# Patient Record
Sex: Female | Born: 1935 | Race: White | Hispanic: No | Marital: Married | Smoking: Former smoker
Health system: Southern US, Community
[De-identification: ages and names within clinical notes are randomized; demographics above are authoritative.]

## PROBLEM LIST (undated history)

## (undated) DIAGNOSIS — K219 Gastro-esophageal reflux disease without esophagitis: Secondary | ICD-10-CM

## (undated) DIAGNOSIS — M810 Age-related osteoporosis without current pathological fracture: Secondary | ICD-10-CM

## (undated) DIAGNOSIS — E0590 Thyrotoxicosis, unspecified without thyrotoxic crisis or storm: Secondary | ICD-10-CM

## (undated) DIAGNOSIS — Z8719 Personal history of other diseases of the digestive system: Secondary | ICD-10-CM

## (undated) DIAGNOSIS — T8859XA Other complications of anesthesia, initial encounter: Secondary | ICD-10-CM

## (undated) DIAGNOSIS — R55 Syncope and collapse: Secondary | ICD-10-CM

## (undated) DIAGNOSIS — I2510 Atherosclerotic heart disease of native coronary artery without angina pectoris: Secondary | ICD-10-CM

## (undated) DIAGNOSIS — I779 Disorder of arteries and arterioles, unspecified: Secondary | ICD-10-CM

## (undated) DIAGNOSIS — I639 Cerebral infarction, unspecified: Secondary | ICD-10-CM

## (undated) DIAGNOSIS — E119 Type 2 diabetes mellitus without complications: Secondary | ICD-10-CM

## (undated) DIAGNOSIS — G8929 Other chronic pain: Secondary | ICD-10-CM

## (undated) DIAGNOSIS — I10 Essential (primary) hypertension: Secondary | ICD-10-CM

## (undated) DIAGNOSIS — H8109 Meniere's disease, unspecified ear: Secondary | ICD-10-CM

## (undated) DIAGNOSIS — N183 Chronic kidney disease, stage 3 unspecified: Secondary | ICD-10-CM

## (undated) DIAGNOSIS — E785 Hyperlipidemia, unspecified: Secondary | ICD-10-CM

## (undated) DIAGNOSIS — E7800 Pure hypercholesterolemia, unspecified: Secondary | ICD-10-CM

## (undated) DIAGNOSIS — I219 Acute myocardial infarction, unspecified: Secondary | ICD-10-CM

## (undated) DIAGNOSIS — M545 Low back pain, unspecified: Secondary | ICD-10-CM

## (undated) DIAGNOSIS — I701 Atherosclerosis of renal artery: Secondary | ICD-10-CM

## (undated) DIAGNOSIS — I739 Peripheral vascular disease, unspecified: Secondary | ICD-10-CM

## (undated) DIAGNOSIS — R001 Bradycardia, unspecified: Secondary | ICD-10-CM

## (undated) DIAGNOSIS — T4145XA Adverse effect of unspecified anesthetic, initial encounter: Secondary | ICD-10-CM

## (undated) DIAGNOSIS — R011 Cardiac murmur, unspecified: Secondary | ICD-10-CM

## (undated) DIAGNOSIS — D649 Anemia, unspecified: Secondary | ICD-10-CM

## (undated) HISTORY — PX: TUBAL LIGATION: SHX77

## (undated) HISTORY — PX: CHOLECYSTECTOMY OPEN: SUR202

## (undated) HISTORY — PX: APPENDECTOMY: SHX54

## (undated) HISTORY — PX: TONSILLECTOMY: SUR1361

## (undated) HISTORY — PX: FRACTURE SURGERY: SHX138

## (undated) HISTORY — PX: BLADDER SUSPENSION: SHX72

## (undated) HISTORY — PX: VAGINAL HYSTERECTOMY: SUR661

## (undated) HISTORY — PX: BACK SURGERY: SHX140

## (undated) HISTORY — PX: CATARACT EXTRACTION W/ INTRAOCULAR LENS  IMPLANT, BILATERAL: SHX1307

---

## 1983-04-15 HISTORY — PX: REDUCTION MAMMAPLASTY: SUR839

## 1997-08-02 ENCOUNTER — Ambulatory Visit (HOSPITAL_COMMUNITY): Admission: RE | Admit: 1997-08-02 | Discharge: 1997-08-02 | Payer: Self-pay | Admitting: *Deleted

## 1998-08-14 ENCOUNTER — Ambulatory Visit (HOSPITAL_COMMUNITY): Admission: RE | Admit: 1998-08-14 | Discharge: 1998-08-14 | Payer: Self-pay | Admitting: *Deleted

## 1998-08-14 ENCOUNTER — Encounter: Payer: Self-pay | Admitting: *Deleted

## 1999-08-23 ENCOUNTER — Ambulatory Visit (HOSPITAL_COMMUNITY): Admission: RE | Admit: 1999-08-23 | Discharge: 1999-08-23 | Payer: Self-pay | Admitting: Obstetrics and Gynecology

## 1999-08-23 ENCOUNTER — Encounter: Payer: Self-pay | Admitting: Obstetrics and Gynecology

## 1999-10-01 ENCOUNTER — Other Ambulatory Visit: Admission: RE | Admit: 1999-10-01 | Discharge: 1999-10-01 | Payer: Self-pay | Admitting: Obstetrics and Gynecology

## 1999-12-23 ENCOUNTER — Other Ambulatory Visit: Admission: RE | Admit: 1999-12-23 | Discharge: 1999-12-23 | Payer: Self-pay | Admitting: Obstetrics and Gynecology

## 1999-12-26 ENCOUNTER — Encounter: Admission: RE | Admit: 1999-12-26 | Discharge: 1999-12-26 | Payer: Self-pay | Admitting: *Deleted

## 1999-12-26 ENCOUNTER — Encounter: Payer: Self-pay | Admitting: *Deleted

## 2000-04-14 DIAGNOSIS — E119 Type 2 diabetes mellitus without complications: Secondary | ICD-10-CM

## 2000-04-14 HISTORY — DX: Type 2 diabetes mellitus without complications: E11.9

## 2000-08-25 ENCOUNTER — Ambulatory Visit (HOSPITAL_COMMUNITY): Admission: RE | Admit: 2000-08-25 | Discharge: 2000-08-25 | Payer: Self-pay | Admitting: Internal Medicine

## 2000-08-25 ENCOUNTER — Encounter: Payer: Self-pay | Admitting: Internal Medicine

## 2000-11-26 ENCOUNTER — Other Ambulatory Visit: Admission: RE | Admit: 2000-11-26 | Discharge: 2000-11-26 | Payer: Self-pay | Admitting: Obstetrics and Gynecology

## 2000-12-16 ENCOUNTER — Ambulatory Visit (HOSPITAL_COMMUNITY): Admission: RE | Admit: 2000-12-16 | Discharge: 2000-12-16 | Payer: Self-pay | Admitting: *Deleted

## 2000-12-16 ENCOUNTER — Encounter: Admission: RE | Admit: 2000-12-16 | Discharge: 2000-12-16 | Payer: Self-pay | Admitting: *Deleted

## 2001-04-29 ENCOUNTER — Encounter: Payer: Self-pay | Admitting: Internal Medicine

## 2001-04-29 ENCOUNTER — Encounter: Admission: RE | Admit: 2001-04-29 | Discharge: 2001-04-29 | Payer: Self-pay | Admitting: Internal Medicine

## 2001-06-29 ENCOUNTER — Emergency Department (HOSPITAL_COMMUNITY): Admission: EM | Admit: 2001-06-29 | Discharge: 2001-06-29 | Payer: Self-pay | Admitting: Emergency Medicine

## 2001-09-09 ENCOUNTER — Ambulatory Visit (HOSPITAL_COMMUNITY): Admission: RE | Admit: 2001-09-09 | Discharge: 2001-09-09 | Payer: Self-pay | Admitting: Internal Medicine

## 2001-09-09 ENCOUNTER — Encounter: Payer: Self-pay | Admitting: Internal Medicine

## 2001-12-03 ENCOUNTER — Other Ambulatory Visit: Admission: RE | Admit: 2001-12-03 | Discharge: 2001-12-03 | Payer: Self-pay | Admitting: Obstetrics and Gynecology

## 2002-02-12 HISTORY — PX: ANTERIOR CERVICAL DECOMP/DISCECTOMY FUSION: SHX1161

## 2002-03-01 ENCOUNTER — Encounter: Payer: Self-pay | Admitting: Neurosurgery

## 2002-03-03 ENCOUNTER — Encounter: Payer: Self-pay | Admitting: Neurosurgery

## 2002-03-03 ENCOUNTER — Inpatient Hospital Stay (HOSPITAL_COMMUNITY): Admission: RE | Admit: 2002-03-03 | Discharge: 2002-03-04 | Payer: Self-pay | Admitting: Neurosurgery

## 2002-07-26 ENCOUNTER — Emergency Department (HOSPITAL_COMMUNITY): Admission: EM | Admit: 2002-07-26 | Discharge: 2002-07-26 | Payer: Self-pay | Admitting: Emergency Medicine

## 2002-07-26 ENCOUNTER — Encounter: Payer: Self-pay | Admitting: Emergency Medicine

## 2002-09-01 ENCOUNTER — Encounter: Payer: Self-pay | Admitting: Neurosurgery

## 2002-09-01 ENCOUNTER — Ambulatory Visit (HOSPITAL_COMMUNITY): Admission: RE | Admit: 2002-09-01 | Discharge: 2002-09-01 | Payer: Self-pay | Admitting: Neurosurgery

## 2002-09-15 ENCOUNTER — Encounter: Payer: Self-pay | Admitting: Obstetrics and Gynecology

## 2002-09-15 ENCOUNTER — Ambulatory Visit (HOSPITAL_COMMUNITY): Admission: RE | Admit: 2002-09-15 | Discharge: 2002-09-15 | Payer: Self-pay | Admitting: Obstetrics and Gynecology

## 2002-11-13 HISTORY — PX: NECK HARDWARE REMOVAL: SUR1129

## 2002-11-21 ENCOUNTER — Encounter: Payer: Self-pay | Admitting: Neurosurgery

## 2002-11-21 ENCOUNTER — Inpatient Hospital Stay (HOSPITAL_COMMUNITY): Admission: RE | Admit: 2002-11-21 | Discharge: 2002-11-23 | Payer: Self-pay | Admitting: Neurosurgery

## 2003-07-18 ENCOUNTER — Observation Stay (HOSPITAL_COMMUNITY): Admission: RE | Admit: 2003-07-18 | Discharge: 2003-07-19 | Payer: Self-pay | Admitting: Urology

## 2003-09-21 ENCOUNTER — Ambulatory Visit (HOSPITAL_COMMUNITY): Admission: RE | Admit: 2003-09-21 | Discharge: 2003-09-21 | Payer: Self-pay | Admitting: Obstetrics and Gynecology

## 2004-08-07 ENCOUNTER — Encounter (INDEPENDENT_AMBULATORY_CARE_PROVIDER_SITE_OTHER): Payer: Self-pay | Admitting: Specialist

## 2004-08-07 ENCOUNTER — Ambulatory Visit (HOSPITAL_COMMUNITY): Admission: RE | Admit: 2004-08-07 | Discharge: 2004-08-07 | Payer: Self-pay | Admitting: Gastroenterology

## 2004-09-30 ENCOUNTER — Ambulatory Visit (HOSPITAL_COMMUNITY): Admission: RE | Admit: 2004-09-30 | Discharge: 2004-09-30 | Payer: Self-pay | Admitting: Internal Medicine

## 2005-04-14 DIAGNOSIS — I219 Acute myocardial infarction, unspecified: Secondary | ICD-10-CM

## 2005-04-14 HISTORY — DX: Acute myocardial infarction, unspecified: I21.9

## 2005-04-14 HISTORY — PX: CARDIAC CATHETERIZATION: SHX172

## 2005-10-02 ENCOUNTER — Ambulatory Visit (HOSPITAL_COMMUNITY): Admission: RE | Admit: 2005-10-02 | Discharge: 2005-10-02 | Payer: Self-pay | Admitting: Obstetrics and Gynecology

## 2005-10-30 ENCOUNTER — Inpatient Hospital Stay (HOSPITAL_COMMUNITY): Admission: EM | Admit: 2005-10-30 | Discharge: 2005-11-10 | Payer: Self-pay | Admitting: Emergency Medicine

## 2005-10-31 ENCOUNTER — Encounter (INDEPENDENT_AMBULATORY_CARE_PROVIDER_SITE_OTHER): Payer: Self-pay | Admitting: Interventional Cardiology

## 2006-05-24 ENCOUNTER — Inpatient Hospital Stay (HOSPITAL_COMMUNITY): Admission: EM | Admit: 2006-05-24 | Discharge: 2006-05-26 | Payer: Self-pay | Admitting: Emergency Medicine

## 2006-05-25 ENCOUNTER — Encounter (INDEPENDENT_AMBULATORY_CARE_PROVIDER_SITE_OTHER): Payer: Self-pay | Admitting: Cardiology

## 2006-07-29 ENCOUNTER — Inpatient Hospital Stay (HOSPITAL_COMMUNITY): Admission: EM | Admit: 2006-07-29 | Discharge: 2006-07-31 | Payer: Self-pay | Admitting: Emergency Medicine

## 2006-09-30 ENCOUNTER — Inpatient Hospital Stay (HOSPITAL_COMMUNITY): Admission: EM | Admit: 2006-09-30 | Discharge: 2006-10-02 | Payer: Self-pay | Admitting: Emergency Medicine

## 2006-10-02 ENCOUNTER — Encounter (INDEPENDENT_AMBULATORY_CARE_PROVIDER_SITE_OTHER): Payer: Self-pay | Admitting: Interventional Cardiology

## 2006-10-02 ENCOUNTER — Ambulatory Visit: Payer: Self-pay | Admitting: Vascular Surgery

## 2006-10-05 ENCOUNTER — Ambulatory Visit (HOSPITAL_COMMUNITY): Admission: RE | Admit: 2006-10-05 | Discharge: 2006-10-05 | Payer: Self-pay | Admitting: Internal Medicine

## 2006-12-14 ENCOUNTER — Ambulatory Visit: Payer: Self-pay | Admitting: Internal Medicine

## 2006-12-14 ENCOUNTER — Inpatient Hospital Stay (HOSPITAL_COMMUNITY): Admission: EM | Admit: 2006-12-14 | Discharge: 2006-12-15 | Payer: Self-pay | Admitting: Emergency Medicine

## 2007-01-01 ENCOUNTER — Emergency Department (HOSPITAL_COMMUNITY): Admission: EM | Admit: 2007-01-01 | Discharge: 2007-01-01 | Payer: Self-pay | Admitting: Emergency Medicine

## 2007-10-20 ENCOUNTER — Ambulatory Visit (HOSPITAL_COMMUNITY): Admission: RE | Admit: 2007-10-20 | Discharge: 2007-10-20 | Payer: Self-pay | Admitting: Internal Medicine

## 2007-10-28 ENCOUNTER — Encounter: Admission: RE | Admit: 2007-10-28 | Discharge: 2007-10-28 | Payer: Self-pay | Admitting: Internal Medicine

## 2007-11-13 HISTORY — PX: BREAST LUMPECTOMY: SHX2

## 2007-11-29 ENCOUNTER — Encounter (INDEPENDENT_AMBULATORY_CARE_PROVIDER_SITE_OTHER): Payer: Self-pay | Admitting: Surgery

## 2007-11-29 ENCOUNTER — Ambulatory Visit (HOSPITAL_BASED_OUTPATIENT_CLINIC_OR_DEPARTMENT_OTHER): Admission: RE | Admit: 2007-11-29 | Discharge: 2007-11-29 | Payer: Self-pay | Admitting: Surgery

## 2007-11-29 ENCOUNTER — Encounter: Admission: RE | Admit: 2007-11-29 | Discharge: 2007-11-29 | Payer: Self-pay | Admitting: Surgery

## 2008-05-10 ENCOUNTER — Inpatient Hospital Stay (HOSPITAL_COMMUNITY): Admission: EM | Admit: 2008-05-10 | Discharge: 2008-05-11 | Payer: Self-pay | Admitting: Emergency Medicine

## 2008-10-20 ENCOUNTER — Ambulatory Visit (HOSPITAL_COMMUNITY): Admission: RE | Admit: 2008-10-20 | Discharge: 2008-10-20 | Payer: Self-pay | Admitting: Internal Medicine

## 2009-02-22 ENCOUNTER — Encounter: Admission: RE | Admit: 2009-02-22 | Discharge: 2009-02-22 | Payer: Self-pay | Admitting: Neurosurgery

## 2009-04-14 HISTORY — PX: HIP FRACTURE SURGERY: SHX118

## 2009-06-30 ENCOUNTER — Emergency Department (HOSPITAL_COMMUNITY): Admission: EM | Admit: 2009-06-30 | Discharge: 2009-06-30 | Payer: Self-pay | Admitting: Emergency Medicine

## 2009-08-12 ENCOUNTER — Emergency Department (HOSPITAL_COMMUNITY): Admission: EM | Admit: 2009-08-12 | Discharge: 2009-08-12 | Payer: Self-pay | Admitting: Emergency Medicine

## 2009-08-13 ENCOUNTER — Emergency Department (HOSPITAL_COMMUNITY): Admission: EM | Admit: 2009-08-13 | Discharge: 2009-08-13 | Payer: Self-pay | Admitting: Emergency Medicine

## 2009-08-15 ENCOUNTER — Ambulatory Visit: Payer: Self-pay | Admitting: Diagnostic Radiology

## 2009-08-15 ENCOUNTER — Encounter: Payer: Self-pay | Admitting: Emergency Medicine

## 2009-08-15 ENCOUNTER — Inpatient Hospital Stay (HOSPITAL_COMMUNITY): Admission: AD | Admit: 2009-08-15 | Discharge: 2009-08-20 | Payer: Self-pay | Admitting: Internal Medicine

## 2009-08-16 ENCOUNTER — Encounter (INDEPENDENT_AMBULATORY_CARE_PROVIDER_SITE_OTHER): Payer: Self-pay | Admitting: Internal Medicine

## 2009-08-16 ENCOUNTER — Ambulatory Visit: Payer: Self-pay | Admitting: Vascular Surgery

## 2009-08-17 ENCOUNTER — Encounter (INDEPENDENT_AMBULATORY_CARE_PROVIDER_SITE_OTHER): Payer: Self-pay | Admitting: Internal Medicine

## 2009-11-21 ENCOUNTER — Ambulatory Visit (HOSPITAL_COMMUNITY): Admission: RE | Admit: 2009-11-21 | Discharge: 2009-11-21 | Payer: Self-pay | Admitting: Obstetrics and Gynecology

## 2010-04-14 HISTORY — PX: RENAL ARTERY STENT: SHX2321

## 2010-04-14 HISTORY — PX: WRIST FRACTURE SURGERY: SHX121

## 2010-04-22 ENCOUNTER — Encounter
Admission: RE | Admit: 2010-04-22 | Discharge: 2010-04-22 | Payer: Self-pay | Source: Home / Self Care | Attending: Internal Medicine | Admitting: Internal Medicine

## 2010-04-28 ENCOUNTER — Emergency Department (HOSPITAL_BASED_OUTPATIENT_CLINIC_OR_DEPARTMENT_OTHER)
Admission: EM | Admit: 2010-04-28 | Discharge: 2010-04-28 | Payer: Self-pay | Source: Home / Self Care | Admitting: Emergency Medicine

## 2010-04-29 ENCOUNTER — Emergency Department (HOSPITAL_BASED_OUTPATIENT_CLINIC_OR_DEPARTMENT_OTHER)
Admission: EM | Admit: 2010-04-29 | Discharge: 2010-04-29 | Payer: Self-pay | Source: Home / Self Care | Admitting: Emergency Medicine

## 2010-04-30 ENCOUNTER — Emergency Department (HOSPITAL_BASED_OUTPATIENT_CLINIC_OR_DEPARTMENT_OTHER)
Admission: EM | Admit: 2010-04-30 | Discharge: 2010-04-30 | Payer: Self-pay | Source: Home / Self Care | Admitting: Emergency Medicine

## 2010-05-02 ENCOUNTER — Ambulatory Visit (HOSPITAL_COMMUNITY)
Admission: RE | Admit: 2010-05-02 | Discharge: 2010-05-02 | Payer: Self-pay | Source: Home / Self Care | Attending: Interventional Cardiology | Admitting: Interventional Cardiology

## 2010-05-05 ENCOUNTER — Encounter: Payer: Self-pay | Admitting: Neurosurgery

## 2010-05-06 LAB — GLUCOSE, CAPILLARY: Glucose-Capillary: 199 mg/dL — ABNORMAL HIGH (ref 70–99)

## 2010-05-07 ENCOUNTER — Ambulatory Visit
Admission: RE | Admit: 2010-05-07 | Discharge: 2010-05-08 | Payer: Self-pay | Source: Home / Self Care | Attending: Orthopedic Surgery | Admitting: Orthopedic Surgery

## 2010-05-08 LAB — POCT I-STAT, CHEM 8
BUN: 13 mg/dL (ref 6–23)
Chloride: 103 mEq/L (ref 96–112)
Glucose, Bld: 160 mg/dL — ABNORMAL HIGH (ref 70–99)
HCT: 37 % (ref 36.0–46.0)
Potassium: 3.6 mEq/L (ref 3.5–5.1)

## 2010-05-08 LAB — GLUCOSE, CAPILLARY: Glucose-Capillary: 143 mg/dL — ABNORMAL HIGH (ref 70–99)

## 2010-05-10 NOTE — Op Note (Signed)
NAME:  [name], [name]                ACCOUNT NO.:  [id number]  MEDICAL RECORD NO.:  [id number]          PATIENT TYPE:  AMB  LOCATION:  SDS                          FACILITY:  [facility]  PHYSICIAN:  Katy Fitch. Sypher, M.D. DATE OF BIRTH:  15-Jan-1932  DATE OF PROCEDURE:  05/07/2010 DATE OF DISCHARGE:  05/02/2010                              OPERATIVE REPORT   PREOPERATIVE DIAGNOSIS:  Comminuted, articular, impacted, displaced dorsal Barton's fracture, left radius.  POSTOPERATIVE DIAGNOSIS:  Comminuted, articular, impacted, displaced dorsal Barton's fracture, left radius.  OPERATION:  Open reduction, internal fixation of left radius with an Acumed dorsal plate system utilizing pegs in the scaphoid and lunate subchondral bone as well as dorsal buttress function.  The fracture was lengthened and the articular surface supported by a cancellous allograft placed prior to plate fixation.  Also, the extensor pollicis longus was re-routed after we noted a 20% abrasion tear/partial rupture of this tendon due to the nature of the articular fracture, with a fracture fragment impinging in the third dorsal compartment.  OPERATING SURGEON:  Katy Fitch. Sypher, M.D.  ASSISTANT:  Annye Rusk, PA-C.  ANESTHESIA:  Left infraclavicular block with sedation.  SUPERVISING ANESTHESIOLOGIST:  Quita Skye. Krista Blue, M.D.  INDICATIONS:  [name] is a 77 year old right-handed retired Designer, jewellery referred through the courtesy of Dr. Kirby Funk in the [facility] for evaluation and management of a comminuted, articular, dorsal Barton's fracture that was significantly impacted and displaced.  Her injury occurred on April 28, 2010.  At the time of her injury, she was noted to have a profoundly elevated systolic blood pressure and was noted to have renal artery stenosis.  Arrangements were made through Dr. Jone Baseman office for her to undergo an angioplasty and stent placement.  This was  accomplished on Thursday, May 02, 2010, in the [facility].  She was unavoidably placed on Plavix following this procedure.  We have had a detailed discussion with Dr. Kirby Funk as well as the attending interventionalist who advised continuing the Plavix during any wrist surgery.  We have discussed this with Mr. and Mrs. Klutts, and they understand that we do have increased risk of postoperative bleeding while on Plavix; however, we will utilize all reasonable clinical means possible to avoid a hematoma formation postoperatively.  The fracture is impacted intra-articular and significantly shortened with the possibility of dorsal escape of the carpus, therefore open reduction internal fixation, dorsal buttress of the scaphoid and lunate facets of the radius, and bone grafting are indicated to optimize left hand function post injury.  Questions regarding the anticipated surgery were invited and answered in detail.  PROCEDURE:  [name] was interviewed by Dr. Krista Blue in the holding area.  A left infraclavicular block was advised for perioperative comfort.  This was accomplished without difficulty under light sedation.  Ms. Bonebrake has multiple, very serious drug allergies, essentially to all narcotics including FENTANYL, MORPHINE, and HYDROMORPHONE.  She is able to tolerate Demerol.  After placement of the block, excellent anesthesia was achieved.  She was subsequently transferred to room one of the [facility] where under Dr. Robina Ade direct  supervision, general sedation was provided.  The left upper extremity was prepped with Betadine soap solution and sterilely draped.  A pneumatic tourniquet was applied to proximal brachium.  On exsanguination of the left arm with Esmarch bandage, the arterial tourniquet was inflated to 220 mmHg.  Ancef 1 g was administered, beginning in the holding area, as an IV prophylactic antibiotic.  A routine surgical time-out  was accomplished followed by an incision extending from the base of the long finger metacarpal 3 cm proximal to Lister's tubercle.  The fourth and third dorsal compartments were identified.  The interval between the fourth and third compartments was incised sharply.  The fourth dorsal compartment tendons were retracted in an ulnar direction and the extensor pollicis longus explored by release of the septum between the third and fourth dorsal compartments.  We immediately encountered a sharp fracture fragment from the comminuted dorsal cortex of the radius and noted a 20% laceration of the extensor pollicis longus as a consequence of this sharp bone edge.  The extensor pollicis longus was re-routed in a subcutaneous direction after release of the fascia proximally and distally, and the areas of damaged tendon were debrided with tenotomy scissors.  A subperiosteal dissection exposed the dorsal aspect of the radius. Lister's tubercle was removed and preserved as a bone graft.  A small dorsal left Acumed plate was selected.  The fracture was reduced with manual manipulation and use of an elevator to anatomically reduce the lunate and scaphoid facets.  This created quite a significant cavity due to comminution.  About 2 mL of cancellous allograft were then morselized and packed into this area, buttressing the articular surface.  The plate was then placed with three nonlocking proximal screws and two distal pegs.  Due to a slightly atypical shape of Mrs. Sinnett' dorsal radius, we had to custom bend the plate to fit the lunate facet.  With bending the plate, we were challenged to have engagement of the distal pins.  We achieved a single pin in the lunate facet and a single pin in the scaphoid facet.  AP and lateral C-arm images documented excellent position of the plate, reduction of the fracture, and good pin length.  After completion of the plate placement, we resected the  posterior interosseous nerve over a distance of 2 cm for postoperative and post- traumatic analgesia.  The posterior interosseous artery was preserved to the carpal arch.  The extensor retinaculum was then repaired over the plate loosely, repairing the fourth dorsal compartment with multiple figure-of-eight sutures of 3-0 Ethibond.  The EPL was carefully re-routed in a subcutaneous manner with further distal fascia excision to prevent friction.  The tourniquet was released and hemostasis was controlled by 10 minutes of direct compression.  We did not encounter significant bleeding despite the Plavix.  The skin was then repaired with subcutaneous 4-0 Vicryl and intradermal 3-0 Prolene loosely.  Three vessel loop drains were placed to allow egress of tense hematoma.  Ms. Mccullers was placed in a voluminous hand-and-arm dressing followed by dorsal and palmar splints, completed to a sugar tong and maintaining the forearm in neutral rotation.  There were no apparent complications.  Due to her health status and recent stent placement, we will observe her for 24 hours postoperatively to monitor blood pressure and also to be certain that she is able to have her pain managed when her block wears off.  She is able to tolerate IV and p.o. Demerol, therefore this will be our primary analgesic.  Katy Fitch Sypher, M.D.     RVS/MEDQ  D:  05/07/2010  T:  05/08/2010  Job:  [id number]  cc:   Thora Lance, M.D.  Electronically Signed by Josephine Igo M.D. on 05/08/2010 07:54:52 AM

## 2010-05-29 NOTE — Procedures (Signed)
NAME:  [name], [name]                ACCOUNT NO.:  [id number]  MEDICAL RECORD NO.:  [id number]          PATIENT TYPE:  AMB  LOCATION:  SDS                          FACILITY:  [facility]  PHYSICIAN:  Corky Crafts, MDDATE OF BIRTH:  [birth year]  DATE OF PROCEDURE:  05/02/2010 DATE OF DISCHARGE:  05/02/2010                   PERIPHERAL VASCULAR INVASIVE PROCEDURE   PERIPHERAL VASCULAR REPORT  PRIMARY CARE PROVIDER:  Thora Lance, MD  PROCEDURES PERFORMED: 1. Abdominal aortogram. 2. Bilateral selective renal angiogram. 3. Left renal artery percutaneous transluminal angioplasty/stent.  OPERATOR:  Corky Crafts, MD  INDICATION:  Refractory hypertension.  PROCEDURE NOTE:  After the risks and benefits of angiography were explained to the patient, informed consent was obtained.  She was brought to the [facility].  She was prepped and draped in the usual sterile fashion.  Her right groin was infiltrated with 1% lidocaine.  A 6-French sheath was placed into the right common femoral artery using modified Seldinger technique.  A pigtail catheter was advanced to the abdominal aorta, and a power injection of contrast was performed in the AP projection.  A short JR-4 catheter was then used to selectively engage the left renal artery.  The same catheter was then used to selectively engage the right renal artery.  Digital angiography was performed in several projections using contrast.  The PTA and stent were then performed.  Please see below for details.  The anterior image with femoral angiogram and Angio-Seal was deployed for hemostasis.  FINDINGS:  Abdominal aorta shows moderate atherosclerosis but no evidence of an aneurysm.  The aortoiliac bifurcation is widely patent. The left kidney:  There is dual arterial supply.  There is a small accessory renal artery which is widely patent which is inferior to the main branch.  The superior main branch has a 70% ostial stenosis with  a 20-mm gradient across the vessel.  In the midportion of this vessel, there appears to be some fibromuscular dysplasia as well. The right kidney is supplied by a single right renal artery which appears widely patent.  PTA/stent narrative:  A 6-French IMA guide catheter was used to engage the main left renal artery.  A Spartacore wire was placed across the area of interest, 5.0 x 20 mm ViaTrac balloon was advanced to the mid vessel where the fibromuscular dysplasia was and inflated to 8 atmospheres for two inflations.  The balloon was then withdrawn to the ostium and inflated again to 8 atmospheres.  A 6.0 x 12 mm Herculink stent was then advanced to the ostium of the main left renal artery and deployed at 11 atmospheres.  There was no additional stenosis and additional inflation was performed to flare the ostium of the stent. There was normal flow through the vessel.  IMPRESSION: 1. Significant left renal artery stenosis, successful percutaneous     transluminal angioplasty to the mid left renal artery treating mild     fibromuscular dysplasia. 2. Successful percutaneous transluminal angioplasty/sent to the ostial     left renal artery with a 6.0 x 12 Herculink stent.  There is no     residual stenosis.  RECOMMENDATIONS:  Continue aspirin for the  next few days.  She will be having arm surgery, so I believe this will have to be stopped.  She will continue Plavix uninterrupted for 30 days.  Of note, IV metoprolol was given during the case to treat her elevated blood pressure and this worked well.  Her initial blood pressure at start of the case was 204 systolic and it came down to about 160 after two rounds of 5 mg IV metoprolol.  The patient will be sent home assuming no problems with her groin.     Corky Crafts, MD     JSV/MEDQ  D:  05/02/2010  T:  05/02/2010  Job:  [id number]  cc:   Thora Lance, M.D. Katy Fitch Sypher, M.D. Lyn Records, M.D.  Electronically  Signed by Lance Muss MD on 05/29/2010 09:35:38 AM

## 2010-06-25 ENCOUNTER — Emergency Department (HOSPITAL_COMMUNITY)
Admission: EM | Admit: 2010-06-25 | Discharge: 2010-06-26 | Disposition: A | Discharge status: Home / Self Care | Payer: Medicare Other | Attending: Emergency Medicine | Admitting: Emergency Medicine

## 2010-06-25 DIAGNOSIS — I10 Essential (primary) hypertension: Secondary | ICD-10-CM | POA: Insufficient documentation

## 2010-06-25 DIAGNOSIS — N390 Urinary tract infection, site not specified: Secondary | ICD-10-CM | POA: Insufficient documentation

## 2010-06-25 DIAGNOSIS — E119 Type 2 diabetes mellitus without complications: Secondary | ICD-10-CM | POA: Insufficient documentation

## 2010-06-25 DIAGNOSIS — R300 Dysuria: Secondary | ICD-10-CM | POA: Insufficient documentation

## 2010-06-25 DIAGNOSIS — M810 Age-related osteoporosis without current pathological fracture: Secondary | ICD-10-CM | POA: Insufficient documentation

## 2010-06-25 DIAGNOSIS — I252 Old myocardial infarction: Secondary | ICD-10-CM | POA: Insufficient documentation

## 2010-06-25 DIAGNOSIS — R509 Fever, unspecified: Secondary | ICD-10-CM | POA: Insufficient documentation

## 2010-06-26 LAB — DIFFERENTIAL
Eosinophils Absolute: 0.1 10*3/uL (ref 0.0–0.7)
Eosinophils Relative: 1 % (ref 0–5)
Lymphs Abs: 0.8 10*3/uL (ref 0.7–4.0)
Monocytes Absolute: 0.4 10*3/uL (ref 0.1–1.0)
Monocytes Relative: 4 % (ref 3–12)

## 2010-06-26 LAB — COMPREHENSIVE METABOLIC PANEL
Albumin: 3.3 g/dL — ABNORMAL LOW (ref 3.5–5.2)
BUN: 26 mg/dL — ABNORMAL HIGH (ref 6–23)
Calcium: 8.9 mg/dL (ref 8.4–10.5)
Chloride: 102 mEq/L (ref 96–112)
Creatinine, Ser: 1.21 mg/dL — ABNORMAL HIGH (ref 0.4–1.2)
Total Bilirubin: 0.5 mg/dL (ref 0.3–1.2)

## 2010-06-26 LAB — CBC
MCH: 29.9 pg (ref 26.0–34.0)
MCHC: 35.2 g/dL (ref 30.0–36.0)
MCV: 84.8 fL (ref 78.0–100.0)
Platelets: 155 10*3/uL (ref 150–400)
RDW: 13.6 % (ref 11.5–15.5)

## 2010-06-26 LAB — URINALYSIS, ROUTINE W REFLEX MICROSCOPIC
Protein, ur: 30 mg/dL — AB
Specific Gravity, Urine: 1.013 (ref 1.005–1.030)
Urobilinogen, UA: 0.2 mg/dL (ref 0.0–1.0)

## 2010-06-26 LAB — URINE MICROSCOPIC-ADD ON

## 2010-06-27 LAB — URINE CULTURE: Culture  Setup Time: 201203140943

## 2010-07-02 LAB — URINALYSIS, ROUTINE W REFLEX MICROSCOPIC
Bilirubin Urine: NEGATIVE
Bilirubin Urine: NEGATIVE
Glucose, UA: NEGATIVE mg/dL
Glucose, UA: NEGATIVE mg/dL
Hgb urine dipstick: NEGATIVE
Ketones, ur: NEGATIVE mg/dL
Ketones, ur: NEGATIVE mg/dL
Nitrite: NEGATIVE
Nitrite: NEGATIVE
Protein, ur: NEGATIVE mg/dL
Specific Gravity, Urine: 1.011 (ref 1.005–1.030)
Urobilinogen, UA: 0.2 mg/dL (ref 0.0–1.0)
pH: 6.5 (ref 5.0–8.0)
pH: 5.5 (ref 5.0–8.0)

## 2010-07-02 LAB — BASIC METABOLIC PANEL
BUN: 15 mg/dL (ref 6–23)
CO2: 24 mEq/L (ref 19–32)
CO2: 27 mEq/L (ref 19–32)
CO2: 27 mEq/L (ref 19–32)
Calcium: 7.6 mg/dL — ABNORMAL LOW (ref 8.4–10.5)
Calcium: 8 mg/dL — ABNORMAL LOW (ref 8.4–10.5)
Chloride: 106 mEq/L (ref 96–112)
Chloride: 106 mEq/L (ref 96–112)
GFR calc non Af Amer: 32 mL/min — ABNORMAL LOW (ref >60)
GFR calc non Af Amer: 56 mL/min — ABNORMAL LOW (ref >60)
Glucose, Bld: 130 mg/dL — ABNORMAL HIGH (ref 70–99)
Glucose, Bld: 146 mg/dL — ABNORMAL HIGH (ref 70–99)
Glucose, Bld: 154 mg/dL — ABNORMAL HIGH (ref 70–99)
Potassium: 3.8 mEq/L (ref 3.5–5.1)
Potassium: 3.9 mEq/L (ref 3.5–5.1)
Potassium: 4.7 mEq/L (ref 3.5–5.1)
Sodium: 134 mEq/L — ABNORMAL LOW (ref 135–145)
Sodium: 135 mEq/L (ref 135–145)
Sodium: 137 mEq/L (ref 135–145)
Sodium: 139 mEq/L (ref 135–145)

## 2010-07-02 LAB — COMPREHENSIVE METABOLIC PANEL
ALT: 26 U/L (ref 0–35)
AST: 37 U/L (ref 0–37)
Alkaline Phosphatase: 66 U/L (ref 39–117)
BUN: 21 mg/dL (ref 6–23)
CO2: 25 mEq/L (ref 19–32)
Calcium: 9.6 mg/dL (ref 8.4–10.5)
Calcium: 9.1 mg/dL (ref 8.4–10.5)
Creatinine, Ser: 1.05 mg/dL (ref 0.4–1.2)
GFR calc Af Amer: 41 mL/min — ABNORMAL LOW (ref >60)
GFR calc non Af Amer: 34 mL/min — ABNORMAL LOW (ref >60)
Glucose, Bld: 110 mg/dL — ABNORMAL HIGH (ref 70–99)
Glucose, Bld: 133 mg/dL — ABNORMAL HIGH (ref 70–99)
Potassium: 4.6 mEq/L (ref 3.5–5.1)
Sodium: 135 mEq/L (ref 135–145)
Total Protein: 7.8 g/dL (ref 6.0–8.3)
Total Protein: 7.3 g/dL (ref 6.0–8.3)

## 2010-07-02 LAB — PROTIME-INR
INR: 0.92 (ref 0.00–1.49)
INR: 0.97 (ref 0.00–1.49)
Prothrombin Time: 12.3 seconds (ref 11.6–15.2)
Prothrombin Time: 13.7 seconds (ref 11.6–15.2)
Prothrombin Time: 19.9 seconds — ABNORMAL HIGH (ref 11.6–15.2)
Prothrombin Time: 12.8 s (ref 11.6–15.2)

## 2010-07-02 LAB — CBC
HCT: 41 % (ref 36.0–46.0)
HCT: 23 % — ABNORMAL LOW (ref 36.0–46.0)
HCT: 26.4 % — ABNORMAL LOW (ref 36.0–46.0)
HCT: 30.8 % — ABNORMAL LOW (ref 36.0–46.0)
HCT: 38.1 % (ref 36.0–46.0)
Hemoglobin: 14.1 g/dL (ref 12.0–15.0)
Hemoglobin: 10.3 g/dL — ABNORMAL LOW (ref 12.0–15.0)
Hemoglobin: 11 g/dL — ABNORMAL LOW (ref 12.0–15.0)
Hemoglobin: 7.8 g/dL — ABNORMAL LOW (ref 12.0–15.0)
Hemoglobin: 9 g/dL — ABNORMAL LOW (ref 12.0–15.0)
Hemoglobin: 13.2 g/dL (ref 12.0–15.0)
MCHC: 34.5 g/dL (ref 30.0–36.0)
MCHC: 33.5 g/dL (ref 30.0–36.0)
MCHC: 34.2 g/dL (ref 30.0–36.0)
MCHC: 34.7 g/dL (ref 30.0–36.0)
MCV: 90.3 fL (ref 78.0–100.0)
MCV: 86 fL (ref 78.0–100.0)
Platelets: 137 10*3/uL — ABNORMAL LOW (ref 150–400)
Platelets: 218 K/uL (ref 150–400)
RBC: 3.41 MIL/uL — ABNORMAL LOW (ref 3.87–5.11)
RBC: 4.43 MIL/uL (ref 3.87–5.11)
RDW: 13.2 % (ref 11.5–15.5)
RDW: 13.2 % (ref 11.5–15.5)
RDW: 13.6 % (ref 11.5–15.5)
RDW: 14.1 % (ref 11.5–15.5)
RDW: 12.6 % (ref 11.5–15.5)
WBC: 7.4 10*3/uL (ref 4.0–10.5)
WBC: 8.8 K/uL (ref 4.0–10.5)

## 2010-07-02 LAB — GLUCOSE, CAPILLARY
Glucose-Capillary: 102 mg/dL — ABNORMAL HIGH (ref 70–99)
Glucose-Capillary: 120 mg/dL — ABNORMAL HIGH (ref 70–99)
Glucose-Capillary: 128 mg/dL — ABNORMAL HIGH (ref 70–99)
Glucose-Capillary: 133 mg/dL — ABNORMAL HIGH (ref 70–99)
Glucose-Capillary: 134 mg/dL — ABNORMAL HIGH (ref 70–99)
Glucose-Capillary: 144 mg/dL — ABNORMAL HIGH (ref 70–99)
Glucose-Capillary: 151 mg/dL — ABNORMAL HIGH (ref 70–99)
Glucose-Capillary: 153 mg/dL — ABNORMAL HIGH (ref 70–99)
Glucose-Capillary: 166 mg/dL — ABNORMAL HIGH (ref 70–99)

## 2010-07-02 LAB — URINE MICROSCOPIC-ADD ON

## 2010-07-02 LAB — DIFFERENTIAL
Basophils Absolute: 0 10*3/uL (ref 0.0–0.1)
Basophils Absolute: 0 K/uL (ref 0.0–0.1)
Basophils Relative: 1 % (ref 0–1)
Basophils Relative: 0 % (ref 0–1)
Eosinophils Absolute: 0.2 10*3/uL (ref 0.0–0.7)
Eosinophils Absolute: 0.2 10*3/uL (ref 0.0–0.7)
Eosinophils Relative: 2 % (ref 0–5)
Eosinophils Relative: 2 % (ref 0–5)
Lymphocytes Relative: 23 % (ref 12–46)
Lymphocytes Relative: 16 % (ref 12–46)
Lymphs Abs: 1.6 10*3/uL (ref 0.7–4.0)
Lymphs Abs: 1.4 10*3/uL (ref 0.7–4.0)
Monocytes Absolute: 0.7 10*3/uL (ref 0.1–1.0)
Monocytes Absolute: 0.5 K/uL (ref 0.1–1.0)
Monocytes Relative: 8 % (ref 3–12)
Monocytes Relative: 6 % (ref 3–12)
Neutro Abs: 4.7 10*3/uL (ref 1.7–7.7)
Neutro Abs: 6.2 10*3/uL (ref 1.7–7.7)
Neutro Abs: 6.7 K/uL (ref 1.7–7.7)
Neutrophils Relative %: 68 % (ref 43–77)
Neutrophils Relative %: 76 % (ref 43–77)

## 2010-07-02 LAB — CARDIAC PANEL(CRET KIN+CKTOT+MB+TROPI)
CK, MB: 1.9 ng/mL (ref 0.3–4.0)
CK, MB: 2.1 ng/mL (ref 0.3–4.0)
Relative Index: 1.3 (ref 0.0–2.5)
Relative Index: 1.9 (ref 0.0–2.5)
Total CK: 119 U/L (ref 7–177)
Total CK: 144 U/L (ref 7–177)
Troponin I: 0.02 ng/mL (ref 0.00–0.06)

## 2010-07-02 LAB — POCT CARDIAC MARKERS
CKMB, poc: <1 ng/mL — ABNORMAL LOW (ref 1.0–8.0)
Myoglobin, poc: 113 ng/mL (ref 12–200)
Troponin i, poc: <0.05 ng/mL (ref 0.00–0.09)

## 2010-07-02 LAB — URINE CULTURE: Colony Count: >=100000

## 2010-07-02 LAB — APTT
aPTT: 29 seconds (ref 24–37)
aPTT: 28 s (ref 24–37)

## 2010-07-02 LAB — CROSSMATCH: Antibody Screen: NEGATIVE

## 2010-07-02 LAB — COMPREHENSIVE METABOLIC PANEL WITH GFR
Albumin: 4 g/dL (ref 3.5–5.2)
BUN: 28 mg/dL — ABNORMAL HIGH (ref 6–23)
Chloride: 100 meq/L (ref 96–112)
Creatinine, Ser: 1.5 mg/dL — ABNORMAL HIGH (ref 0.4–1.2)
Total Bilirubin: 0.6 mg/dL (ref 0.3–1.2)

## 2010-07-02 LAB — ABO/RH: ABO/RH(D): A POS

## 2010-07-07 LAB — DIFFERENTIAL
Eosinophils Relative: 2 % (ref 0–5)
Lymphocytes Relative: 22 % (ref 12–46)
Lymphs Abs: 1.5 10*3/uL (ref 0.7–4.0)
Monocytes Relative: 8 % (ref 3–12)

## 2010-07-07 LAB — BASIC METABOLIC PANEL
BUN: 25 mg/dL — ABNORMAL HIGH (ref 6–23)
GFR calc Af Amer: 38 mL/min — ABNORMAL LOW (ref >60)
GFR calc non Af Amer: 31 mL/min — ABNORMAL LOW (ref >60)
Potassium: 4.9 mEq/L (ref 3.5–5.1)
Sodium: 140 mEq/L (ref 135–145)

## 2010-07-07 LAB — CBC
HCT: 39 % (ref 36.0–46.0)
Platelets: 233 10*3/uL (ref 150–400)
RBC: 4.43 MIL/uL (ref 3.87–5.11)
WBC: 7.1 10*3/uL (ref 4.0–10.5)

## 2010-07-07 LAB — URINALYSIS, ROUTINE W REFLEX MICROSCOPIC
Bilirubin Urine: NEGATIVE
Nitrite: NEGATIVE
Specific Gravity, Urine: 1.006 (ref 1.005–1.030)
pH: 7.5 (ref 5.0–8.0)

## 2010-07-07 LAB — HEMOCCULT GUIAC POC 1CARD (OFFICE): Fecal Occult Bld: POSITIVE

## 2010-07-29 LAB — CBC
HCT: 34.8 % — ABNORMAL LOW (ref 36.0–46.0)
HCT: 35.8 % — ABNORMAL LOW (ref 36.0–46.0)
HCT: 37.4 % (ref 36.0–46.0)
MCHC: 33.6 g/dL (ref 30.0–36.0)
MCHC: 33.9 g/dL (ref 30.0–36.0)
MCV: 87.9 fL (ref 78.0–100.0)
MCV: 88.5 fL (ref 78.0–100.0)
Platelets: 200 10*3/uL (ref 150–400)
Platelets: 205 10*3/uL (ref 150–400)
Platelets: 214 10*3/uL (ref 150–400)
RDW: 12.9 % (ref 11.5–15.5)
RDW: 13 % (ref 11.5–15.5)
RDW: 13.4 % (ref 11.5–15.5)
WBC: 6.6 10*3/uL (ref 4.0–10.5)
WBC: 7.4 10*3/uL (ref 4.0–10.5)

## 2010-07-29 LAB — APTT: aPTT: >200 seconds (ref 24–37)

## 2010-07-29 LAB — CARDIAC PANEL(CRET KIN+CKTOT+MB+TROPI)
CK, MB: 1.5 ng/mL (ref 0.3–4.0)
Relative Index: INVALID (ref 0.0–2.5)
Relative Index: INVALID (ref 0.0–2.5)
Total CK: 48 U/L (ref 7–177)
Total CK: 56 U/L (ref 7–177)
Troponin I: 0.01 ng/mL (ref 0.00–0.06)

## 2010-07-29 LAB — BASIC METABOLIC PANEL
BUN: 28 mg/dL — ABNORMAL HIGH (ref 6–23)
BUN: 38 mg/dL — ABNORMAL HIGH (ref 6–23)
BUN: 39 mg/dL — ABNORMAL HIGH (ref 6–23)
CO2: 23 mEq/L (ref 19–32)
CO2: 23 mEq/L (ref 19–32)
Chloride: 103 mEq/L (ref 96–112)
Chloride: 104 mEq/L (ref 96–112)
Chloride: 109 mEq/L (ref 96–112)
Creatinine, Ser: 1.15 mg/dL (ref 0.4–1.2)
Creatinine, Ser: 1.58 mg/dL — ABNORMAL HIGH (ref 0.4–1.2)
GFR calc Af Amer: 40 mL/min — ABNORMAL LOW (ref >60)
GFR calc non Af Amer: 33 mL/min — ABNORMAL LOW (ref >60)
Glucose, Bld: 154 mg/dL — ABNORMAL HIGH (ref 70–99)
Glucose, Bld: 188 mg/dL — ABNORMAL HIGH (ref 70–99)
Potassium: 3.9 mEq/L (ref 3.5–5.1)
Potassium: 4.2 mEq/L (ref 3.5–5.1)
Potassium: 4.3 mEq/L (ref 3.5–5.1)
Sodium: 137 mEq/L (ref 135–145)

## 2010-07-29 LAB — TSH: TSH: 0.525 u[IU]/mL (ref 0.350–4.500)

## 2010-07-29 LAB — GLUCOSE, CAPILLARY
Glucose-Capillary: 143 mg/dL — ABNORMAL HIGH (ref 70–99)
Glucose-Capillary: 234 mg/dL — ABNORMAL HIGH (ref 70–99)

## 2010-07-29 LAB — PROTIME-INR: Prothrombin Time: 13.9 seconds (ref 11.6–15.2)

## 2010-07-29 LAB — POCT CARDIAC MARKERS
CKMB, poc: <1 ng/mL — ABNORMAL LOW (ref 1.0–8.0)
Troponin i, poc: <0.05 ng/mL (ref 0.00–0.09)

## 2010-07-29 LAB — HEMOGLOBIN A1C: Hgb A1c MFr Bld: 6.2 % — ABNORMAL HIGH (ref 4.6–6.1)

## 2010-07-29 LAB — HEPARIN LEVEL (UNFRACTIONATED): Heparin Unfractionated: 0.36 IU/mL (ref 0.30–0.70)

## 2010-07-29 LAB — LIPID PANEL
Cholesterol: 177 mg/dL (ref 0–200)
HDL: 31 mg/dL — ABNORMAL LOW (ref >39)
Total CHOL/HDL Ratio: 5.7 RATIO

## 2010-08-27 NOTE — Discharge Summary (Signed)
NAME:  [name], [name]                ACCOUNT NO.:  [id number]   MEDICAL RECORD NO.:  [id number]          PATIENT TYPE:  INP   LOCATION:  4706                         FACILITY:  [facility]   PHYSICIAN:  Guy Franco, P.A.       DATE OF BIRTH:  [birth year]   DATE OF ADMISSION:  09/30/2006  DATE OF DISCHARGE:  10/02/2006                               DISCHARGE SUMMARY   DISCHARGE DIAGNOSES:  1. Hypertension, medical management.  2. A 60% renal artery stenosis, left.  3. Known coronary artery disease.  4. Hyperlipidemia.  5. Diabetes mellitus, diet controlled.  6. Seasonal allergies.  7. Multiple medication intolerances, including CODEINE, FENTANYL,      INAPSINE, something that she says was used in the operating room,      MORPHINE, NORVASC causes leg swelling, AVAPRO, LISINOPRIL causes      cough and diarrhea, BETA BLOCKERS IN THE PAST, although she can      take Coreg, SHELL FISH, IV DYE, FOSAMAX, ACTONEL, ZYRTEC, DRICORT,      LIPITOR, SEAFOOD.   HOSPITAL COURSE:  [name] is a 77 year old female with a known history  of coronary artery disease.  She suffered a myocardial infarction in  June 2007 involving the right coronary artery that was unable to be  opened.  Repeat catheterization in April 2008 confirmed that the total  occlusion of the mid-right coronary artery with left to right  collaterals.  She also had a mid 50-60% LAD lesion, circumflex was  patent.  She was also noted to have a left ostial 60% renal artery  stenosis.  EF at that time was 60%.   She was awakened on the morning of admission with left sided substernal  chest pain that lasted about 45 minutes.  She took 2 sublingual  nitroglycerin with good relief.  She took her blood pressure, and it was  213/110.  Blood pressure remained elevated during the morning, and she  called the office and then proceeded to the emergency room.  In the  emergency room, blood pressure was 186/90.   LABORATORY STUDIES DURING THE  PATIENT'S HOSPITAL STAY:  Sodium 139,  potassium 4.4, BUN 22, creatinine 0.97.  D-dimer 0.41.  Hemoglobin 12.9,  hematocrit 37.8, white count 7.0, platelets 254.  Cardiac isoenzymes  were negative.  Triglycerides 453, HDL 29, LDL unable to calculate  secondary to hypertriglyceridemia, total cholesterol 163, TSH 0.921.   The patient was placed in the hospital, and clonidine was added to her  regimen.  She said that she could not use this medication secondary to  the fact that it kept her awake all night long.  She did also complain  of some blurred vision, and felt this was secondary to the clonidine.  She was now refusing clonidine.  We did perform carotid Dopplers, and  preliminary results showed right ICA stenosis 40-60%, left ICA stenosis  40-60%.   Because the carotid Dopplers are complete, and the fact that she has had  Cardizem in the past, we are going to discharge her home on Cardizem-CD  120 mg a day and  have her follow up in the office.   DISCHARGE MEDICATIONS:  She is to start Cardizem-CD 120 mg 1 tablet  daily.   She is to resume her other medications:  1. Coreg 25 mg p.o. b.i.d.  2. Allegra 180 mg a day.  3. Maxzide daily.  4. K-Dur 20 mEq daily.  5. Zocor 40 mg a day.  6. Nexium as prior to admission.  7. Coated aspirin 325 mg a day.  8. Antivert as needed for dizziness.  9. She may resume the following vitamins:  Vitamin E, cod oil,      Ocuvite, folic acid, and vitamin B6 daily.   DISCHARGE INSTRUCTIONS:  Remain on a low-sodium, heart healthy diabetic  diet, increase activity slowly, and follow up with Dr. Verdis Prime on  November 05, 2006, at 2:30 p.m.   Please note that the patient had a 24-hour urine performed to rule out  pheochromocytoma.  The results are not yet back.  She just finished the  test some time late yesterday.  We need to check on the results when she  comes back into the office.      Guy Franco, P.A.     LB/MEDQ  D:  10/02/2006  T:   10/03/2006  Job:  [id number]

## 2010-08-27 NOTE — Discharge Summary (Signed)
NAME:  [name], [name] NO.:  [id number]   MEDICAL RECORD NO.:  [id number]          PATIENT TYPE:  INP   LOCATION:  4729                         FACILITY:  [facility]   PHYSICIAN:  Jake Bathe, MD      DATE OF BIRTH:  1931-12-31   DATE OF ADMISSION:  05/10/2008  DATE OF DISCHARGE:  05/11/2008                               DISCHARGE SUMMARY   PRIMARY CARE PHYSICIAN:  Thora Lance, MD   CARDIOLOGIST:  Lyn Records, MD   FINAL DIAGNOSES:  1. Chest pain/angina - low risk acute coronary syndrome - all cardiac      biomarkers were normal , EKGs were unremarkable without any change,      the patient chest pain free shortly after arrival.  2. Coronary artery disease - acute right ventricular infarct in 2007.      Cardiac catheterization in April 2008 showing an occluded right      coronary artery, left anterior descending 50-60% stenosis - medical      management.  3. Renal artery stenosis - left 60%.  4. Chronic kidney disease - improved with hydration during this      hospitalization and discontinuation of Maxzide.  Creatinine was 1.6      down to 1.1.  5. Diabetes mellitus - diet controlled per patient, hemoglobin A1c      6.2.  6. Mixed hyperlipidemia - triglycerides 546, HDL 31, total cholesterol      177, and LDL uncalculable - the patient has been on Lopid in the      past, has tried TriCor in the past also but has had some      intolerance issues.  She has not been on fish oil.  Her diabetes as      above has been under good control.  She has been battling this      issue for quite some time.  7. Hypertension - throughout hospitalization blood pressures 100/55 to      136/64.  8. SHELLFISH allergy.   BRIEF HOSPITAL COURSE:  The patient was admitted very early in the  morning on May 10, 2008, after she was awoken by substernal chest  discomfort that was categorized as severe and similar to her previous  angina but not quite as severe as her previous  heart attack.  It did not  radiate and it was not associated with any nausea, vomiting, or excess  diaphoresis.  It was a pressure-type discomfort and prompted her  emergency room evaluation.  Once here, she was given IV heparin and  nitroglycerin which she states that once the heparin was initiated her  chest discomfort was relieved.  She walks daily and has not had any  problems.  She describes no fevers or coughing.  Currently, she is  feeling well, relaxing in bed, has been ambulatory without any  difficulty, and describes no further chest pain.  All ECGs on this  admission demonstrate Q-waves in the inferior leads, otherwise  unremarkable.  No ST changes.  Cardiac enzymes as described above were  all unremarkable.  DISCHARGE MEDICATIONS:  1. Carvedilol 25 mg twice a day.  2. Zocor 40 mg once a day.  3. Protonix 40 mg twice a day.  4. Fexofenadine 180 mg once a day.  5. Azor 5/20 mg take one-half tablet once a day.  6. Aspirin 325 mg at night.  On admission, Maxzide 25 mg was discontinued secondary to renal  insufficiency.  Kidney function has improved with hydration.  I asked  her to discontinue this medication.   FOLLOWUP:  Tuesday, February 2, at 3:00 p.m. with Tillman Sers, NP/Dr.  Verdis Prime.   ACTIVITY:  Increase activity slowly.  I would avoid exercise at this  time until discussed with Dr. Katrinka Blazing.   Further testing with cardiac catheterization may be required if angina  continues to return.  It is reasonable at this time to allow her to be  discharged given her low risk ACS status with close followup.  Of  course, if she has any other worrisome symptoms such as chest  discomfort, nitroglycerin p.r.n. can be administered and she should  contact us immediately.  Discharge plans have been discussed with her  and she understands.  The case had briefly been discussed with Dr. Katrinka Blazing  via Dr. Lance Muss and he is aware of this plan of action.   DISCHARGE TIME:  35  minutes.      Jake Bathe, MD  Electronically Signed     MCS/MEDQ  D:  05/11/2008  T:  05/11/2008  Job:  [id number]

## 2010-08-27 NOTE — Op Note (Signed)
NAME:  [name], [name]                ACCOUNT NO.:  [id number]   MEDICAL RECORD NO.:  [id number]          PATIENT TYPE:  AMB   LOCATION:  DSC                          FACILITY:  [facility]   PHYSICIAN:  Thomas A. Cornett, M.D.DATE OF BIRTH:  10-Aug-1931   DATE OF PROCEDURE:  11/29/2007  DATE OF DISCHARGE:                               OPERATIVE REPORT   PREOPERATIVE DIAGNOSIS:  Left breast mass.   POSTOPERATIVE DIAGNOSIS:  Left breast mass.   PROCEDURE:  Left breast needle-localized lumpectomy.   SURGEON:  Maisie Fus A. Cornett, MD   ANESTHESIA:  LMA with 0.25% Sensorcaine local.   ESTIMATED BLOOD LOSS:  15 mL.   SPECIMEN:  Left breast tissue with localizing wire clip sent to  pathology for evaluation.   INDICATIONS FOR PROCEDURE:  The patient is a 77 year old female who had  a left breast mass biopsy due to calcifications.  This showed fat  necrosis.  There was some concern this could be discordant with the  images and excisional biopsy is recommended for further evaluation.  She  received cardiac clearance and now presents today for left breast needle-  localized lumpectomy.   DESCRIPTION OF PROCEDURE:  The patient was brought to the operating room  after undergoing left breast needle localization.  After induction of  LMA anesthesia, the left breast was prepped and draped in a sterile  fashion.  The wire exited the left lateral breast.  Curvilinear incision  was made around the wire. The wire was brought out through the incision  and all the tissue around the tip of the wire was excised.  Radiographs  revealed the wire in its entirety and the tissue to be present with  calcifications.  This was sent to pathology.  We examined the lumpectomy  cavity and used cautery for hemostasis.  I closed the deep pore of the  cavity with 3-0 Vicryl and then used 4-0 Monocryl and a subcuticular  stitch.  Dermabond was applied as dressing.  All final counts of sponge,  needle and instruments were  found to be correct at this portion of the  case.  The patient was then awoke, taken to recovery in satisfactory  condition.  All final counts were found to be correct.      Thomas A. Cornett, M.D.  Electronically Signed     TAC/MEDQ  D:  11/29/2007  T:  11/30/2007  Job:  [id number]   cc:   Thora Lance, M.D.

## 2010-08-27 NOTE — H&P (Signed)
NAME:  [name], [name]                ACCOUNT NO.:  [id number]   MEDICAL RECORD NO.:  [id number]          PATIENT TYPE:  INP   LOCATION:  4729                         FACILITY:  [facility]   PHYSICIAN:  Rollene Rotunda, MD, FACCDATE OF BIRTH:  1931-11-24   DATE OF ADMISSION:  05/10/2008  DATE OF DISCHARGE:                              HISTORY & PHYSICAL   PRIMARY CARE PHYSICIAN:  Thora Lance, M.D.   CARDIOLOGIST:  Lyn Records, M.D.   REASON FOR PROCEDURE:  Evaluate patient with chest pain.   HISTORY OF PRESENT ILLNESS:  Patient is 77 years old.  She has a history  of coronary disease, as described below.  She has been getting along  relatively well.  However, tonight at 12:45 while asleep, she developed  chest discomfort that awake her.  It was substernal.  It was severe.  It  was similar to previous angina though not quite as severe as her  previous heart attack.  It did not radiate.  It was not associated with  nausea, vomiting, or excessive diaphoresis.  There were no other  associated symptoms.  It was a pressure-type discomfort.  She came to  the emergency room presenting around 2 a.m.  She was not found to have  any acute ST segment elevation.  She was given IV nitroglycerin with  subsequent complete relief of her chest discomfort.  She has had no  recurrence.  She says otherwise she has been active.  She walks with  this.  She does not bring on any chest discomfort, neck, or arm  discomfort.  She denies any palpitations, presyncope, or syncope.  She  has no PND or orthopnea.   PAST MEDICAL HISTORY:  1. Coronary artery disease (acute right ventricular infarct in 2007      She had a catheterization in April, 2008 demonstrating an occluded      right coronary artery.  LAD with 50-60% stenosis.  She was managed      medically).  2. Left renal artery stenosis, 60%.  3. Diabetes mellitus (diet controlled, per the patient).  4. Hypertension.   PAST SURGICAL HISTORY:  1. Ear  drum surgery on the left.  2. Rhinoplasty.  3. Breast reduction.  4. Cholecystectomy.  5. Hysterectomy.  6. Bladder tack.  7. C5-6 cervical spine surgery.   ALLERGIES/INTOLERANCES:  MORPHINE, CODEINE, FENTANYL, and DROPERIDOL.   MEDICATIONS:  1. Labetalol 25 mg b.i.d.  2. Azor 5/20 1/2 daily.  3. Maxzide 25 mg daily.  4. Protonix 40 mg daily.  5. Fexofenadine 180 mg daily.  6. Simvastatin 40 mg daily.   SOCIAL HISTORY:  She lives with her husband.  She is a retired Engineer, civil (consulting).  She quit smoking 20 years ago.  She does not drink alcohol.   FAMILY HISTORY:  Contributory for both the parents having vascular  disease at a later age with the mother with a CVA.  Her father had heart  failure and myocardial infarction.   REVIEW OF SYSTEMS:  As stated in the HPI, otherwise negative for all  other systems.   PHYSICAL EXAMINATION:  Patient is pleasant and in no distress.  Blood pressure 121/53, heart rate 73 and regular.  Respiratory rate 16.  Afebrile.  HEENT:  Eyelids unremarkable.  Pupils are equal, round and reactive to  light.  Fundi not visualized.  Oral mucosa unremarkable.  NECK:  No jugular venous distention at 45 degrees.  Carotid upstroke  brisk and symmetric.  Left carotid bruit, no thyromegaly.  LYMPHATICS:  No cervical, axillary, or inguinal adenopathy.  LUNGS:  Clear to auscultation bilaterally.  BACK:  No costovertebral angle tenderness.  CHEST:  Unremarkable.  HEART:  PMI not displaced or sustained.  S1 and S2 within normal limits.  No S3, no S4, no clicks, rubs, or murmurs.  ABDOMEN:  Flat, positive bowel sounds.  Normal in frequency and pitch.  No bruits, rebound, guarding.  There are no midline pulsatile masses.  No hepatomegaly, no splenomegaly.  SKIN:  No rashes, no nodules.  EXTREMITIES:  Pulses 2+ throughout.  No cyanosis, no clubbing, no edema.  NEUROLOGIC:  Alert and oriented to person, place, and time.  Cranial  nerves II-XII grossly intact.  Motor grossly  intact.   EKG:  Sinus rhythm.  Rate 70.  Old inferior infarct.  Poor anterior R  wave progression.  No acute ST/T changes.   LABS:  Sodium 133, potassium 4.3, BUN 39, creatinine 1.58.  Other labs  pending.   CHEST X-RAY:  No acute disease.   ASSESSMENT/PLAN:  1. Chest pain:  The patient's chest pain is worrisome for unstable      angina.  There is no objective evidence of ischemia; however.  At      this point, I will admit with heparin, beta blockers, aspirin, and      IV nitroglycerin.  We will rule out myocardial infarction.  I would      consider cardiac catheterization based on the description.  I will      defer to Dr. Katrinka Blazing.  We do have to consider problem #2 below.  2. Renal insufficiency:  Her creatinine is slightly higher than      previous.  I will withhold her Maxzide and thus allow some gentle      hydration.  We can follow the creatinine.  I will continue her ARB.  3. We will cover with sliding-scale insulin.  4. Risk reduction:  We will check a lipid profile and continue her      fenofibrate and statin.  5. Hypertension:  We will continue the meds as listed with the      exceptions as described.  6. Carotid bruits:  She should have a carotid Doppler as an      outpatient.      Rollene Rotunda, MD, [facility]  Electronically Signed     JH/MEDQ  D:  05/10/2008  T:  05/10/2008  Job:  [id number]

## 2010-08-27 NOTE — Discharge Summary (Signed)
NAME:  DELLE, ANDRZEJEWSKI                ACCOUNT NO.:  [id number]   MEDICAL RECORD NO.:  [id number]          PATIENT TYPE:  INP   LOCATION:  4729                         FACILITY:  [facility]   PHYSICIAN:  Jake Bathe, MD      DATE OF BIRTH:  10/15/1931   DATE OF ADMISSION:  05/10/2008  DATE OF DISCHARGE:  05/11/2008                               DISCHARGE SUMMARY   ADDENDUM   Dictation Number [id number].   HOSPITAL COURSE:  White count was elevated to 11 from 6.4 on discharge.  This is secondary to her prednisone that she was taking prophylactically  for her SHELLFISH allergy in case cardiac catheterization had to be done  this morning.  She also received Mucomyst overnight for renal  protection.  She has no signs of infection currently.  She did note that  prior to admission the day prior she felt a viral prodrome and had a  bout of diarrhea and possible fever.  This was explained to me after I  was explaining to her that she was possibly slightly dehydrated causing  her worsening renal insufficiency.  Currently, she has had no further  bowel movements and is feeling well.  No fevers during this  hospitalization.      Jake Bathe, MD  Electronically Signed     MCS/MEDQ  D:  05/11/2008  T:  05/11/2008  Job:  [id number]

## 2010-08-27 NOTE — Consult Note (Signed)
NAME:  [name], [name]                ACCOUNT NO.:  [id number]   MEDICAL RECORD NO.:  [id number]          PATIENT TYPE:  INP   LOCATION:  4709                         FACILITY:  [facility]   PHYSICIAN:  Armanda Magic, M.D.     DATE OF BIRTH:  01-13-1932   DATE OF CONSULTATION:  12/14/2006  DATE OF DISCHARGE:                                 CONSULTATION   REFERRING PHYSICIAN:  Theressa Millard, M.D.   PRIMARY CARDIOLOGIST:  Lyn Records, M.D.   CHIEF COMPLAINT:  Chest pain, indigestion, hypertension.   HISTORY OF PRESENT ILLNESS:  This is a 77 year old white female with a  history of coronary artery disease status post myocardial infarction in  August 2007, admitted last evening with complaints of tachy palpitations  throughout the day and indigestion unlike what she experienced with her  MI.  This morning after eating she developed epigastric and left-sided  chest pressure.  We are now asked to consult.   PAST MEDICAL HISTORY:  1. Acute MI with RV infarct complicated of cardiogenic shock and AV      block requiring temporary pacing in August 2007.  2. Diabetes mellitus.  3. Poorly controlled hypertension.  4. A 60% left renal artery stenosis.   PAST SURGICAL HISTORY:  1. Status post left eardrum surgery.  2. Status post rhinoplasty.  3. Status post breast reduction.  4. Status post cholecystectomy.  5. Status post hysterectomy.  6. Status post bladder tuck.  7. Status post cervical plating at C5 and C6.   SOCIAL HISTORY:  She is married.  She quit tobacco 20 years ago.  She  denies any alcohol use.  She is a retired Garment/textile technologist.   FAMILY HISTORY:  Mother did have a CVA.  Her father had CHF and MI.   ALLERGIES:  MORPHINE, CODEINE, FENTANYL AND NAPROXEN.   REVIEW OF SYSTEMS:  Review of systems is otherwise negative.   PHYSICAL EXAMINATION:  VITAL SIGNS:  Blood pressure is 149/78, heart  rate 78, respirations 24.  T-max 99.8.  GENERAL:  She is a well-developed,  well-nourished female in no acute  distress.  HEENT:  Benign.  NECK:  Supple without lymphadenopathy.  Carotid upstrokes +2  bilaterally, no bruits.  LUNGS:  Clear to auscultation throughout.  HEART:  Regular rate and rhythm.  No murmurs, rubs or gallops.  Normal  S1 and S2.  ABDOMEN:  Benign.  EXTREMITIES:  No edema.   LABORATORY DATA:  Telemetry shows a normal sinus rhythm.  EKG shows a  normal sinus rhythm with inferior MI, nonspecific ST changes.  CPK 66,  MB 1.7, troponin 0.02.  Cardiac point of care enzymes negative x2.  Sodium 138, potassium 4, chloride 106, bicarb 29, BUN 28, creatinine  1.3.   ASSESSMENT:  1. Hypertension, poorly controlled.  Blood pressure 189/91 on      admission but apparently at home when she took it, it was over      200/110.  She has had difficulty in controlling her blood pressure      in the past, apparently was referred to [facility]  for      evaluation.  She was supposed to do 24-hour blood pressure      monitoring and a sleep study but none of these were done.  She is      continuing to have problems with controlling her blood pressure.  2. Coronary disease status post inferior myocardial infarction with      right ventricular infarct in August 2007.  She had a repeat cath in      April 2008 for chest pain which showed an occluded right coronary      artery with left-to-right collaterals, moderate left anterior      descending with 50-60% stenosis, patent left circumflex, 60% left      renal artery stenosis and normal left ventricular function.  3. Diabetes mellitus.  4. Questionable gastroesophageal reflux disease.  5. Tachy palpitations with anxiety.  6. Atypical chest pain.  7. A 60% left renal artery stenosis.   PLAN:  Continue to follow cardiac enzymes.  Further workup per Dr.  Katrinka Blazing.  Aggressive blood pressure control.  Will increase Cardizem CD to  180 mg a day.  Agree with increasing Nexium to b.i.d. dosing.      Armanda Magic, M.D.  Electronically Signed     TT/MEDQ  D:  12/14/2006  T:  12/14/2006  Job:  [id number]   cc:   Lyn Records, M.D.  Theressa Millard, M.D.

## 2010-08-27 NOTE — H&P (Signed)
NAME:  [name], [name] NO.:  [id number]   MEDICAL RECORD NO.:  [id number]          PATIENT TYPE:  INP   LOCATION:  1829                         FACILITY:  [facility]   PHYSICIAN:  Corwin Levins, MD      DATE OF BIRTH:  1931/06/05   DATE OF ADMISSION:  12/13/2006  DATE OF DISCHARGE:                              HISTORY & PHYSICAL   CHIEF COMPLAINT:  Chest pain and epigastric discomfort.   HISTORY OF PRESENT ILLNESS:  Ms. Klutts is a 77 year old white female  here with the above for several hours prior to admission.  She described  her discomfort as indigestion.  She was given GI cocktail in the ER  without relief.  There was some radiation to the back, as well as  shortness of breath, but no nausea, vomiting, diaphoresis, palpitations  or syncope.  She has known history of coronary artery disease and GERD.  There is no pain now.  She is a former Designer, multimedia, with multiple drug  intolerances, especially to blood pressure medicines.   PAST MEDICAL HISTORY AND ILLNESSES:  1. Hypertension.  2. Known left renal artery stenosis, 60%.  3. History of coronary artery disease, status post myocardial      infarction in June 2001.  4. Hypercholesterolemia.  5. Allergies.  6. Diabetes mellitus, controlled with diet.  7. GERD.   PAST SURGICAL HISTORY:  Multiple, including:  1. Left TM.  2. Rhinoplasty.  3. Breast reduction.  4. Cholecystectomy.  5. TAH.  6. Bladder tack x1 with 2 revisions.  7. Uterine prolapse.  8. C-spine surgery with plating to C5 and C6.   ALLERGIES:  Multiple intolerances, including:  1. CODEINE.  2. FENTANYL.  3. INAPSINE.  4. MORPHINE.  5. NORVASC.  6. AVAPRO.  7. LISINOPRIL.  8. BETA BLOCKERS.  9. COREG, though she is on Coreg at the moment.  10.SHELLFISH.  11.IV DYE.  12.FOSAMAX.  13.ACTONEL.  14.ZYRTEC.  15.LIPITOR.  16.SEAFOOD.  17.__________ .   CURRENT MEDICATIONS:  1. Cardizem-CD 120 mg 1 p.o. daily.  2. Allegra 180 mg p.o.  daily.  3. Maxzide 1 p.o. daily.  4. K-Dur 20 mEq p.o. daily.  5. Zocor 40 mg p.o. daily.  6. Nexium 40 mg p.o. daily.  7. Antivert as directed.  8. Multiple vitamins and health food supplements.  9. Nitroglycerin sublingual p.r.n.   SOCIAL HISTORY:  Married, lives with her husband.  Retired Engineer, civil (consulting) as  above.  No tobacco for 20 years.  No alcohol.   FAMILY HISTORY:  Father deceased at 67 with CHF and MI.  Mother deceased  at 58 with CVA.   REVIEW OF SYSTEMS:  Otherwise noncontributory.   PHYSICAL EXAMINATION:  VITAL SIGNS:  She has a temperature 99.0, blood  pressure 182/91, heart rate 76, respirations 18, O2 saturation 97%.  ENT:  Sclerae clear.  TMs clear.  Oropharynx benign.  NECK:  Without lymphadenopathy, JVD or thyromegaly.  CHEST:  No rales or wheezing.  CARDIAC:  Regular rate and rhythm.  ABDOMEN:  Soft, nontender.  Positive bowel sounds.  EXTREMITIES:  No  edema.   LABORATORY DATA:  Chest x-ray:  No acute disease.  ECG:  Sinus rhythm  with old inferior MI.  LFTs pending.  CPK-MB normal, troponin-I less  than 0.05.  Electrolytes within normal limits.  BUN 28, creatinine 1.3,  glucose 138.  White blood cell count 7.8, hemoglobin 13.3.   ASSESSMENT AND PLAN:  1. Chest pain, atypical.  Admit for telemetry.  Rule out myocardial      infarction with cardiac enzymes, and Dr. Garnette Scheuermann has been      consulted by the EDP, and plans to consult in the morning.  2. Hypertension:  Restart home medications.  3. Diabetes mellitus, normally controlled with diet.  We will check      hemoglobin A1c.  4. Other medical problems:  Continue home medications.  5. Code status:  Full code.  6. Prophylaxis:  Give Lovenox subcutaneous.  7. Disposition:  For home when improved.      Corwin Levins, MD  Electronically Signed     JWJ/MEDQ  D:  12/14/2006  T:  12/14/2006  Job:  [id number]   cc:   Thora Lance, M.D.  Lyn Records, M.D.

## 2010-08-30 NOTE — Op Note (Signed)
NAME:  [name], [name]                          ACCOUNT NO.:  [id number]   MEDICAL RECORD NO.:  [id number]                   PATIENT TYPE:  OBV   LOCATION:  1191                                 FACILITY:  [facility]   PHYSICIAN:  Jamison Neighbor, M.D.               DATE OF BIRTH:  October 20, 1931   DATE OF PROCEDURE:  07/18/2003  DATE OF DISCHARGE:                                 OPERATIVE REPORT   PREOPERATIVE DIAGNOSES:  1. Cystocele.  2. Rectocele.   POSTOPERATIVE DIAGNOSES:  1. Cystocele.  2. Rectocele.   OPERATION/PROCEDURE:  1. Anterior cystocele repair with use of a PelviSoft graft.  2. Rectocele repair.  3. Removal of sebaceous cyst.   SURGEON:  Jamison Neighbor, M.D.   ASSISTANT:  Thyra Breed, M.D.   ANESTHESIA:  General endotracheal anesthesia.   DRAINS:  16-French Foley catheter to straight drain.   ESTIMATED BLOOD LOSS:  Less than 30 mL.   COMPLICATIONS:  None.   INDICATIONS FOR PROCEDURE:  Mrs. Golomb is a 77 year old female with a  history of bladder surgery several years ago.  Specifically, she had a  suspension done which was then redone with a vaginosacropexy.  At this time  the patient has developed a recurrence of a cystocele as well as a small  rectocele.  The patient is symptomatic at this point and uncomfortable and  states she is having trouble doing walking.  Therefore, she has been  consented on the risks, benefits and alternatives and undergoing an anterior  as well as posterior repair.  In addition, she has a sebaceous cyst on her  right inner thigh.  She wishes to have this removed at the time of her  vaginal repair.  The patient understands all the risks associated with these  procedures and informed consent is obtained.   DESCRIPTION OF PROCEDURE:  Following identification by her arm bracelet, the  patient is brought to the operating room and placed in the supine position.  She then received preoperative IV antibiotics and underwent general  endotracheal anesthesia.  She was then moved to the dorsal lithotomy  position and her perineum and genitalia prepped and draped in the usual  sterile fashion.  A weighted speculum was initially placed to view the  extent of the patient's cystocele.  The patient did indeed have evidence of  a cystocele more posterior in the vaginal vault.  The anterior vaginal  mucosa along its midline overlying the cystocele was then infiltrated with  approximately 18 mL of 0.5% Marcaine.  A long-handled scalpel was then used  to make a midline incision.  Skin hooks were then applied to a ring  retractor to aid in further dissection.  Using Strully scissors and DeBakey  forceps, the underlying cystocele was carefully dissected from the overlying  anterior vaginal mucosa.  Hemostasis was obtained during this portion of the  procedure by using the Bovie.  Once  the anterior vaginal mucosa had been  sufficiently dissected away from the underlying cystocele, a dry Ray-Tec was  packed in between the tissue planes to help develop it fully.  The Ray-Tec  sponges were then removed.  Interrupted sutures of 2-0 Vicryl were then  placed to tighten the area underlying the cystocele, thereby providing a  strong scaffold for prevention of recurrent cystocele.  In addition, there  appeared to be some minimal laxity in the anterior vaginal vault.  We  elected to use a piece of PelviSoft synthetic tissue to add more strength to  the area.  The PelviSoft was then tacked down in a four-corner fashion using  2-0 Vicryl suture.  All excess graft was then trimmed and discarded.  Any  excess anterior vaginal mucosa was then trimmed to allow a tight closure of  the anterior vaginal vault.  A 2-0 Vicryl suture was then used in a running  fashion to close the anterior vaginal vault defect.  The patient had no  evidence of a cystocele with an excellent repair following this portion of  the procedure.  The vaginal vault was then  copiously irrigated with  antibiotic solution.  The weighted speculum was then removed and a Deaver  retractor was used to elevate the anterior vaginal vault.  This provided  evidence of a small but not trivial rectocele which would require further  repair.   We began the rectocele repair by making small midline incision through the  posterior vaginal mucosa.  Two Allis clamps were then used to elevate the  mucosa on either side.  Strully scissors were again used to dissect all  underlying tissues from the posterior vaginal wall.  Again a dry Ray-Tec  sponge was used to help bluntly develop these tissue planes.  Once adequate  mobilization of the underlying rectocele was obtained, interrupted 2-0  Vicryl sutures were used to reapproximate the tissue underlying the  posterior vaginal mucosa to prevent recurrence of the patient's rectocele.  Once this portion of the procedure was completed, the posterior vaginal  mucosa was then closed with a running 2-0 Vicryl suture to obtain an  excellent result.  The Deaver retractor as well as the ring retractor were  removed.  The wounds were then copiously irrigated again with antibiotic  solution.  The patient appeared to have an excellent result from her vaginal  vault repair.  We then used Neosporin-saturated vaginal packing to pack the  vaginal vault.   Following this portion of the procedure, we then turned our attention to a  sebaceous cyst on the inner right thigh.  This area was again prepped and  draped.  A #11 blade was then used to sharply incise the surface of the  cyst.  This revealed underlying sebaceous material which was removed with  Adson forceps.  The teeth of the Adsons were then used to grasp the lower  portion of the cyst and elevate the cyst and its wall so that it could be  sharply excised using the scalpel.  The cyst and all its remaining contents was then removed in its entirety.  The base of the cyst excision has been   checked for any residual cyst tissue.  Hemostasis was obtained using Bovie  electrocautery.  The wound was then irrigated.  Subcutaneous tissue was then  reapproximated using an interrupted 3-0 Vicryl figure-of-eight suture and 3-  0 Vicryl was then used to reapproximate the skin with horizontal mattress  suture.  A  Band-aid  was then applied.  This marked termination of the procedure.  The  patient tolerated the procedure well and there were no complications.   Please note that Dr. Logan Bores was present and participated in the entire  procedure as he was the responsible surgeon.   DISPOSITION:  After waking from general anesthesia, the patient was  transported to the post anesthesia care unit in stable condition.  From  here, she would be admitted for overnight observation and removal of her  vaginal packing and Foley catheter in the morning.     Thyra Breed, MD                            Jamison Neighbor, M.D.    EG/MEDQ  D:  07/18/2003  T:  07/18/2003  Job:  [id number]

## 2010-08-30 NOTE — Consult Note (Signed)
NAME:  [name], [name] NO.:  [id number]   MEDICAL RECORD NO.:  [id number]          PATIENT TYPE:  INP   LOCATION:  2628                         FACILITY:  [facility]   PHYSICIAN:  Lyn Records, M.D.   DATE OF BIRTH:  1931-06-30   DATE OF CONSULTATION:  05/24/2006  DATE OF DISCHARGE:                                 CONSULTATION   REASON FOR CONSULTATION:  Arm discomfort.   CONCLUSIONS:  1. Bilateral arm pressure and dyspnea, etiology uncertain, rule out      ischemic myocardial referred pain.  2. History of coronary atherosclerotic heart disease.      a.     Acute inferior and right ventricular infarction in August of       2007, that was complicated by shock and complete heart block but       eventually resolved.  The right coronary could not be mechanically       recannulized.  3. Hyperlipidemia.  4. Diabetes.   RECOMMENDATIONS:  1. Rule out MI with markers and EKGs.  2. Antithrombotic therapy.  3. If positive markers will need coronary angio, if negative markers      Cardiolite study should be performed.  4. Aspirin.  5. Statin therapy.  6. Beta-blocker therapy.  7. IV nitroglycerin for cardiac discomfort.  8. IV Integrilin if positive markers.   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  She  developed right ventricular infarction, inferior infarction, shock and  third degree heart block.  Her vessel could not be mechanically  recannulized.  She subsequently recovered and has done well.  She was  admitted to the hospital on this occasion because of the sudden  development of heaviness in both arms that awakened her from sleep.  There was some shortness of breath associated with the discomfort.  She  did not use nitroglycerin and the discomfort eventually resolved  spontaneously.  She came to the emergency room and has been admitted to  the hospital to rule out  myocardial infarction.  Initial point of care  markers have been negative.   MEDICATIONS:  1. Maxzide 37.5/25 mg daily.  2. Coreg 6.25 mg b.i.d.  3. Nexium 40 mg per day.  4. Lopid 600 mg twice a day.  5. Vytorin, current dose not known.  6. Aspirin 325 mg per day.  7. Macrodantin 50 mg daily.  8. Allegra 180 mg per day.  9. Potassium 20 mEq per day.  10.Vitamin E.  11.Cod liver oil.  12.Ocuvite.  13.Folic acid.  14.Vitamin B12.   ALLERGIES:  1. MORPHINE.  2. CODEINE.  3. FENTANYL.  4. NAPROSYN.   FAMILY HISTORY:  Positive for vascular disease.   SIGNIFICANT PAST MEDICAL PROBLEMS:  1. Diabetes.  2. Coronary atherosclerosis.   HABITS:  Smoked but has not smoked in 20 years.  Denies ethanol  consumption.   EXAM:  The patient is in no acute distress.  Blood pressure 140/80,  heart rate 80, respirations 20.  NECK VEINS:  Not distended, no carotid bruits are heard.  LUNGS:  Clear.  No murmur, no rub, no click, no gallop is heard.  ABDOMEN:  Soft.  EXTREMITIES:  No edema.   The EKGs are normal.  She does have evidence of old inferior infarction  with small inferior Q waves.  Point of care markers were negative.  Regular laboratory markers are normal.  The BNP is normal.  Chest x-ray  unremarkable.   DISCUSSION:  The patient has bilateral arm heaviness and dyspnea.  This  could be an ischemic equivalent.  She has had a recent myocardial  infarction.  Will rule her out.  Further testing will depend upon  markers.  Will tentatively discuss with the patient for a Cardiolite.      Lyn Records, M.D.  Electronically Signed     HWS/MEDQ  D:  05/24/2006  T:  05/24/2006  Job:  [id number]   cc:   Thora Lance, M.D.

## 2010-08-30 NOTE — H&P (Signed)
NAME:  [name], [name]                ACCOUNT NO.:  [id number]   MEDICAL RECORD NO.:  [id number]          PATIENT TYPE:  INP   LOCATION:  2909                         FACILITY:  [facility]   PHYSICIAN:  Colleen Can. Deborah Chalk, M.D.DATE OF BIRTH:  1931-05-17   DATE OF ADMISSION:  10/30/2005  DATE OF DISCHARGE:                                HISTORY & PHYSICAL   HISTORY OF PRESENT ILLNESS:  [name] is a 77 year old white woman who  is admitted to [facility] for an acute inferior myocardial  infarction.   The patient, who has no past history of cardiac disease presented to the  emergency department with a 6-hour history of chest pain.  It began soon  after her eating dinner.  The chest pain is described as a mid-substernal  pressure.  It does not radiate.  It is associated with dyspnea, diaphoresis  and nausea.  There are no exacerbating factors.  It appears to be unrelated  to position, activity, meals or respirations.  Upon presentation to the  emergency department, an electrocardiogram demonstrated acute inferior ST  segment elevation.   As noted, the patient has no past history of cardiac disease, including no  history of chest pain, myocardial infarction, coronary artery disease,  congestive heart failure or arrhythmia.  The patient has a number of risk  factors for coronary artery disease including hypertension, dyslipidemia,  non-insulin-dependent diabetes mellitus, and family history of coronary  artery disease.  The patient has no history of smoking.   PAST MEDICAL HISTORY:  The patient's only other medical problem is  gastroesophageal reflux.   SOCIAL HISTORY:  The patient lives with her husband.  She neither smokes  cigarettes nor drinks alcohol.   FAMILY HISTORY:  Family history is notable for coronary artery disease.   OPERATIONS:  1.  Hysterectomy.  2.  Neck surgery.   MEDICATIONS:  Allegra, Nexium, and Sudafed.   ALLERGIES:  CODEINE, MORPHINE, and  FENTANYL.   REVIEW OF SYSTEMS:  Review of systems reveals no new problems related to her  head, eyes, ears, nose, mouth, throat, lungs, gastrointestinal system,  genitourinary system, or extremities.  There is no history of neurologic or  psychiatric disorder.  There is no history of fever, chills or weight loss.  The patient has had a previous stress test, performed by Dr. Katrinka Blazing, which  was normal.   PHYSICAL EXAMINATION:  VITAL SIGNS:  Blood pressure 172/82.  Pulse 87 and  regular.  Respirations 20.  Temperature 97.4.  GENERAL:  The patient was an elderly white woman in mild discomfort.  She  was alert, oriented, and appropriate.  HEENT:  Head, eyes, nose, and mouth were normal.  NECK:  The neck was without thyromegaly or adenopathy.  Carotid pulses were  palpable bilaterally and without bruits.  CARDIAC:  Examination revealed a normal S1 and S2.  There was no S3, S4,  murmur, rub, or click.  Cardiac rhythm was regular.  No chest wall  tenderness was noted.  LUNGS:  The lungs were clear.  ABDOMEN:  The abdomen was soft and nontender.  There was  no mass,  hepatosplenomegaly, bruit, distention, rebound, guarding, or rigidity.  Bowel sounds were normal.  BREASTS, PELVIC AND RECTAL:  Examinations were not performed as they were  not pertinent to the reason for acute care hospitalization.  EXTREMITIES:  The extremities were without edema, deviation, or deformity.  Radial and dorsalis pedal pulses were palpable bilaterally.  NEUROLOGIC:  Brief screening neurologic survey was unremarkable.   LABORATORY AND ACCESSORY CLINICAL DATA:  The electrocardiogram demonstrated  acute inferior ST segment elevations.   The chest radiograph was pending at the time of this dictation.   All laboratory studies were pending at the time of this dictation.   IMPRESSION:  1.  Acute inferior myocardial infarction.  2.  Hypertension.  3.  Dyslipidemia.  4.  Diabetes mellitus.  5.  Gastroesophageal  reflux.   PLAN:  1.  Emergent cardiac catheterization; Dr. Deborah Chalk has been notified.  2.  Intravenous heparin.  3.  Aspirin.  4.  Intravenous nitroglycerin.  5.  Beta blocker.  6.  Further measures per Dr. Deborah Chalk.      Ulyses Amor, MD   Electronically Signed     ______________________________  Colleen Can. Deborah Chalk, M.D.    MSC/MEDQ  D:  10/30/2005  T:  10/30/2005  Job:  [id number]   cc:   Ulyses Amor, MD  Fax: [phone]   Lyn Records, M.D.  Fax: [phone]

## 2010-08-30 NOTE — Cardiovascular Report (Signed)
NAME:  [name], MACFADDEN NO.:  [id number]   MEDICAL RECORD NO.:  [id number]          PATIENT TYPE:  INP   LOCATION:  2008                         FACILITY:  [facility]   PHYSICIAN:  Lyn Records, M.D.   DATE OF BIRTH:  08-15-1931   DATE OF PROCEDURE:  07/30/2006  DATE OF DISCHARGE:                            CARDIAC CATHETERIZATION   INDICATIONS:  Prolonged chest pain without objective evidence of  ischemia or infarction in this patient with known total occlusion of the  right coronary artery.  Study is being done to rule out progression of  left coronary disease.  The patient is known to have chronically totally  occluded right.   PROCEDURE PERFORMED:  1. Left heart catheterization.  2. Selective coronary angiography.  3. Left ventriculography.  4. Abdominal aortography performed because of significant and somewhat      labile hypertension.   DESCRIPTION:  A 6-French arterial sheath was placed in the right femoral  artery using a modified Seldinger technique.  A 6-French A2 multipurpose  catheter was used for hemodynamic recordings; coronary angiography could  not be performed with this catheter.  A 6-French left 4 Judkins catheter  was used for left coronary angiography and a #4 catheter for right  coronary angiography.  A short pigtail catheter was used for abdominal  aortography.  The patient tolerated the procedure without problems.  Angio-Seal was used to achieve hemostasis without complications.   RESULTS:   I. HEMODYNAMIC DATA:  A.  Aortic pressure 165/62.  B.  Left ventricular pressure was 168/17.   II. LEFT VENTRICULOGRAPHY:  Mild to moderate inferior hypokinesis is  noted.  EF is 60%.   III. CORONARY ANGIOGRAPHY:  A.  Left main coronary:  Widely patent.  B.  Left anterior descending coronary:  The LAD is very tortuous.  Irregularities are noted.  The midvessel contains an eccentric 60%  narrowing.  LAD wraps around the left ventricular apex.  C.   Circumflex artery:  The circumflex coronary artery gives origin to 1  large first obtuse marginal that bifurcates into 2 smaller distal obtuse  marginal branches.  Luminal irregularities are noted.  D.  Right coronary:  The right coronary artery arises with a shepherd's  crook; it is quite tortuous and is totally occluded after a small acute  marginal branch.  The distal right coronary fills briskly by collaterals  from the left coronary circulation.   IV. ABDOMINAL AORTOGRAPHY:  No abdominal aortic aneurysm is noted.  The  left kidney is supplied by 2 renal arteries.  The superior and more  dominant branch contains an ostial 50% to 60% stenosis.  The smaller  branch is patent.  The right renal artery is widely patent.   CONCLUSIONS:  1. Significant coronary artery disease with total occlusion of the      right coronary in the mid segment with brisk and widely patent left-      to-right collaterals.  2. Moderate left anterior descending disease with 50% to 60% mid left      anterior descending stenosis.  The vessel is tortuous.  The      circumflex is widely patent.  3. Left renal artery:  Sixty percent ostial narrowing.  4. Overall normal left ventricular function.   PLAN:  Continue aggressive risk factor modification and medical therapy  of blood pressure, hopeful discharge are within the next 24 hours.      Lyn Records, M.D.     HWS/MEDQ  D:  07/30/2006  T:  07/31/2006  Job:  [id number]   cc:   Thora Lance, M.D.

## 2010-08-30 NOTE — Discharge Summary (Signed)
NAME:  [name], [name] NO.:  [id number]   MEDICAL RECORD NO.:  [id number]          PATIENT TYPE:  INP   LOCATION:  2008                         FACILITY:  [facility]   PHYSICIAN:  Lyn Records, M.D.   DATE OF BIRTH:  [birth year]   DATE OF ADMISSION:  07/29/2006  DATE OF DISCHARGE:  07/31/2006                               DISCHARGE SUMMARY   PRIMARY CARE PHYSICIAN:  Thora Lance, M.D.   CARDIOLOGIST:  Lyn Records, M.D.   CHIEF COMPLAINT:  Chest pain.   HISTORY OF PRESENT ILLNESS:  Mrs. Oconnor is a 77 year old female with a  history of coronary artery disease, with a previous right ventricular MI  in June 2007, dyslipidemia, and hypertension.  She presented to the  emergency department with a complaint of a 3-day history of sudden onset  of substernal, non-exertional chest pain, that she described as  indigestion, originally relieved with Nexium 40 mg x1.  She had  recurrence of the chest pain on the day prior to admission without  radiation of the pain.  She also denied any associated symptoms.  She  took sublingual nitroglycerin x3 on the day prior without relief.  Her  blood pressure was elevated on the day prior to admission at 209/94.  She was instructed by the MD on call to take an additional dosage of  Coreg 12.5 mg and received a decrease in her blood pressure to 180/80.  She has a history of normal LV function with an EF of 60-65% via 2-D  echocardiogram.  On the day of admission she took sublingual  nitroglycerin x1 without relief and experienced a 10 to 15-second of  unresponsiveness, believed to be secondary to hypotension.  She denied a  loss of consciousness, however, admitted to coolness and being clammy.  She admitted to being chest pain free upon admission.  A 12-lead EKG  revealed normal sinus rhythm at 53 beats per minute without acute  ischemic changes.  Point-of-care enzymes were pending.   PAST MEDICAL HISTORY:  1. Coronary artery  disease with a previous RV MI complicated by shock      and complicated by a heart block, both of which were resolved.  2. Hypertension,.  3. Dyslipidemia.  4. Seasonal allergies.  5. Diabetes mellitus, diet-controlled.   ALLERGIES:  1. CODEINE.  2. FENTANYL.  3. MORPHINE.  4. NAPROSYN.   CURRENT MEDICATIONS:  1. Simvastatin 40 mg daily.  2. Allegra 180 mg daily.  3. Coreg 12.5 mg twice daily.  4. Klor-Con 20 mEq daily.  5. Triamterene/HCTZ 37.5/25 mg daily.  6. Sublingual nitroglycerin 0.4 mg as needed.   PAST SURGICAL HISTORY:  1. Status post left eardrum surgery.  2. Rhinoplasty  3. Breast reduction.  4. Status post cholecystectomy.  5. Status post hysterectomy.  6. Status post bladder tack once with revision x2.  7. Prolapsed uterine surgery.  8. Status post cervical plating of the C5 and V6 vertebrae.   FAMILY HISTORY:  Father deceased age 54, CHF and MI.  Mother deceased  ate 49, hemorrhagic CVA.   SOCIAL HISTORY:  Married with two adult children.  Lives with her  husband.  She is a retired Engineer, civil (consulting) from [employer].  She is a  former smoker with cessation 20 years ago.  She currently denies  tobacco, alcohol, or illicit drug use.   REVIEW OF SYSTEMS:  All other systems reviewed are negative other than  what is stated in the HPI.   PHYSICAL EXAMINATION:  GENERAL:  A 77 year old female, pleasant, and  cooperative, NAD.  VITAL SIGNS:  Temperature 98.2, blood pressure 180/93, pulse 63,  respirations 18, O2 saturations 100% over 2 liters.  HEENT:  Benign.  NECK:  Right carotid bruits.  No bruit auscultated on the left.  No JVD.  Neck is supple.  LUNGS:  Clear to auscultation bilaterally.  No use of accessory muscles.  CV:  Regular rate and rhythm.  Normal S1-S2 without murmurs, gallops,  clicks, or rubs.  ABDOMEN:  Benign.  EXTREMITIES:  No peripheral edema, cyanosis, or clubbing.  DP and  femoral pulses 2+/2, bilaterally.  Groin with no femoral bruits,   bilaterally.  SKIN:  Warm and dry without rashes or lesions.  NEUROLOGIC:  No focal motor or sensory deficits.  PSYCHIATRIC:  Normal mood and affect.  BACK:  No kyphosis or scoliosis.   LABORATORY DATA:  White blood count 9.2, hemoglobin 13.3, hematocrit  39.1, platelets 256,000.  Sodium 137, potassium 3.9, chloride 106, CO2  28, BUN 22, creatinine 1.1, glucose 185.  PT 13, INR 1, PTT 28.  Magnesium 2.  BNP 161.  Myoglobin 73.8, CK-MB 1, troponin I less than  0.05.  Serial cardiac enzymes CK total 69, CK-MB 1.9.  Troponin I 0.01.   EKG as stated in the HPI.  Chest X-Ray: No acute cardiopulmonary  findings.  Adenosine Cardiolite, May 25, 2006, revealed a scar in  the inferior wall and a portion of the inferoseptal wall.  There was no  inducible ischemia.  EF was 58%.   ASSESSMENT:  1. Chest pain consistent with unstable angina.  2. Coronary artery disease with previous right ventricular myocardial      infarction.  3. Hypertension.  4. Diabetes mellitus.  5. Dyslipidemia.  6. Seasonal allergies.   Otherwise, as stated in the past medical history.   PLAN:  1. Admit to a cardiac/telemetry unit under the service of Dr. Verdis Prime, with a diagnosis of chest pain consistent with unstable      angina.  2. Rule out MI.  Continue to cycle serial cardiac enzymes.  3. Continue home medications.  4. Initiate cardiology p.r.n. orders.  5. Cardiac catheterization in the a.m. to be performed by Dr. Verdis Prime, to rule out progression of left coronary disease.  The      cardiac catheterization procedure has been explained to the patient      in detail including MI, CVA, death, renal insufficiency, IV      contrast dye allergy, and vascular complications including      bleeding, hematoma, or pseudoaneurysm.  The patient admitted to      full understanding of the information and wished to proceed. 6. The patient was seen, interviewed, and examined by Dr. Verdis Prime      who  participated in the medical decision making and plan of care.      Tylene Fantasia, [place]      Lyn Records, M.D.  Electronically Signed    RDM/MEDQ  D:  07/31/2006  T:  08/01/2006  Job:  [id number]   cc:   Thora Lance, M.D.

## 2010-08-30 NOTE — Discharge Summary (Signed)
NAME:  [name], [name]                ACCOUNT NO.:  [id number]   MEDICAL RECORD NO.:  [id number]          PATIENT TYPE:  INP   LOCATION:  4739                         FACILITY:  [facility]   PHYSICIAN:  Thora Lance, M.D.  DATE OF BIRTH:  08/01/1931   DATE OF ADMISSION:  05/23/2006  DATE OF DISCHARGE:  05/26/2006                               DISCHARGE SUMMARY   REASON FOR ADMISSION:  This is a 77 year old white female admitted with  an episode of heaviness in both arms which she woke up with on the  morning of admission.  Also short of breath.   SIGNIFICANT FINDINGS:  Temperature 98.7.  Blood pressure 150/84.  Heart  rate 85.  Respirations 20.  O2 saturation 97% on room air.  Chest clear.  Heart regular rate and rhythm without murmur gallop or rub.  Abdomen  benign.  Extremities showed no edema.   LABORATORY DATA:  Sodium 140.  Potassium 4.  Chloride 107.  Bicarbonate  28.  BUN 22.  Creatinine 1.  Negative point-of-care enzymes.  D-dimer  0.24.  Chest x-ray of bronchitic changes in the right atelectasis.  EKG:  Normal sinus rhythm.  T-waves in II, III, AVF.  No acute ST or T wave  changes.   HOSPITAL COURSE:  The patient was admitted for arm discomfort and  shortness of breath to rule out an ischemic equivalent.  She was ruled  out for a MI by cardiac enzymes.  She was seen by cardiology.  She was  placed on IV nitroglycerin, Lovenox, aspirin as well as continued on her  outpatient medications.  An adenosine Cardiolite study was done on  February 11 and showed no evidence of ischemia, but Did show an old  inferior septal scar.  2D echocardiogram showed normal ejection  fraction.  The patient's Coreg was doubled in dose.  She was discharged  in good condition.  She will follow up with Dr. Katrinka Blazing.  If she has  further chest or arm discomfort a cardiac catheterization will be  considered.   DISCHARGE DIAGNOSES:  1. Arm heaviness..  2. Dyspnea.  3. Coronary artery disease.  4.  Diabetes mellitus.  5. Hyperlipidemia.   PROCEDURES:  1. 2D echocardiogram.  2. Adenosine Cardiolite.   MEDICATIONS:  1. Maxzide 37.5/25 mg daily.  2. Coreg 12.5 mg b.i.d.  3. Nexium 40 mg daily.  4. Zocor 40 mg a day.  5. Aspirin 325 mg a day.  6. Macrodantin daily.  7. Allegra 180 mg daily.  8. Potassium chloride 20 mEq daily.   DISPOSITION:  Discharged to home.   ACTIVITY:  As tolerated.   FOLLOWUP:  Dr. Valentina Lucks next scheduled appointment.   DIET:  Low sodium, low fat diet.           ______________________________  Thora Lance, M.D.     JJG/MEDQ  D:  05/26/2006  T:  05/26/2006  Job:  [id number]

## 2010-08-30 NOTE — Cardiovascular Report (Signed)
NAME:  [name], LINEMAN NO.:  [id number]   MEDICAL RECORD NO.:  [id number]          PATIENT TYPE:  INP   LOCATION:  2909                         FACILITY:  [facility]   PHYSICIAN:  Lyn Records, M.D.   DATE OF BIRTH:  [birth year]   DATE OF PROCEDURE:  11/01/2005  DATE OF DISCHARGE:                              CARDIAC CATHETERIZATION   INDICATIONS:  The patient is status post inferior and right ventricular  infarction and now has high grade AV block, a pacemaker is being placed to  prevent profound bradycardia.   PROCEDURE PERFORMED:  Transvenous pacemaker via right femoral vein using the  Seldinger technique.   DESCRIPTION:  After informed consent, 2% Xylocaine was used to anesthetize  the right iliofemoral region.  This was the same leg that was used during  the acute cardiac catheterization on October 30, 2005, by Dr. Deborah Chalk.  We  entered the right femoral vein with a single anterior stick and the sheath  was placed using modified Seldinger technique over a guidewire.  We then  floated a balloon-tipped 5-French bipolar pacing catheter to the right  ventricular apex under fluoroscopic guidance without complications.  We  established a threshold of 1 mA.  The backup setting was a rate of 45 and MA  of 5.   CONCLUSION:  Successful placement of temporary transvenous pacemaker via the  right femoral vein.   COMPLICATIONS:  None.      Lyn Records, M.D.  Electronically Signed     HWS/MEDQ  D:  11/01/2005  T:  11/01/2005  Job:  [id number]   cc:   Thora Lance, M.D.

## 2010-08-30 NOTE — Discharge Summary (Signed)
NAME:  [name], [name]                ACCOUNT NO.:  [id number]   MEDICAL RECORD NO.:  [id number]          PATIENT TYPE:  INP   LOCATION:  4709                         FACILITY:  [facility]   PHYSICIAN:  Thora Lance, M.D.  DATE OF BIRTH:  1931-04-20   DATE OF ADMISSION:  12/13/2006  DATE OF DISCHARGE:  12/15/2006                               DISCHARGE SUMMARY   REASON FOR ADMISSION:  A 77 year old white female who presented with  chest pain radiating to the back, as well as shortness of breath, no  nausea, vomiting or diaphoresis.  The patient has known CAD and  gastroesophageal reflux disease.   PHYSICAL EXAMINATION:  VITAL SIGNS:  Temperature 99, blood pressure  182/91, heart rate 76, respirations 18.  LUNGS:  Clear.  HEART:  Regular rate and rhythm.  ABDOMEN:  Benign.  EXTREMITIES:  No edema.   Chest x-ray:  No acute disease.   EKG:  Sinus rhythm with old IMI.   Chemistry with sodium 138, potassium 4, chloride 100, bicarbonate 28,  BUN 28, creatinine 1.3, glucose 138.  Hemoglobin 12.9, wbc 7, platelet  count 254.  PT 12.5.  Liver function tests normal.  CK 62, MB 1.5,  troponin I 0.02.   HOSPITAL COURSE:  The patient was admitted with an episode of chest  pain.  She was seen by the cardiology consultation service.  Her  hypertension was poorly controlled and her Cardizem was increased in  dose.  Her cardiac enzymes were followed and continued to be negative.  Her Nexium was increased to b.i.d. to cover for reflux symptoms.  The  second hospital day, the patient's Nexium was switched to Protonix as  the patient felt this worked better for her.  Dr. Katrinka Blazing saw the patient  December 15, 2006, and did not feel like the patient needed a repeat  Cardiolite.  Cozaar was started.  The patient was discharged in good  condition.   DISCHARGE DIAGNOSES:  1. Chest pain.  2. Coronary artery disease.  3. Hypertension.  4. Diabetes mellitus.  5. Left renal artery stenosis.  6.  Hyperlipidemia.  7. Gastroesophageal reflux disease.   DISCHARGE MEDICATIONS:  1. Carvedilol.  2. Fexofenadine 180 mg a day.  3. Maxzide 25/37.5 mg a day.  4. K-Dur 20 mEq a day.  5. Simvastatin 40 mg a day.  6. Protonix 40 mg b.i.d.  7. Cozaar 25 mg a day.   DISPOSITION:  Discharged home.   DIET:  Low sodium diet.   FOLLOW-UP:  Dr. Valentina Lucks in four weeks.           ______________________________  Thora Lance, M.D.     Delorse Limber  D:  01/13/2007  T:  01/13/2007  Job:  [id number]

## 2010-08-30 NOTE — Op Note (Signed)
NAME:  [name], [name]                ACCOUNT NO.:  [id number]   MEDICAL RECORD NO.:  [id number]          PATIENT TYPE:  AMB   LOCATION:  ENDO                         FACILITY:  [facility]   PHYSICIAN:  Danise Edge, M.D.   DATE OF BIRTH:  1931-08-25   DATE OF PROCEDURE:  08/07/2004  DATE OF DISCHARGE:                                 OPERATIVE REPORT   PROCEDURE:  Colonoscopy and polypectomy.   PROCEDURE INDICATIONS:  [name] is a 77 year old female born  1931-10-26.  [name] is scheduled to undergo her first screening  colonoscopy with polypectomy to prevent colon cancer.   ENDOSCOPIST:  Danise Edge, M.D.   PREMEDICATION:  1.  Versed 7.5 mg.  2.  Demerol 50 mg.   PROCEDURE:  After obtaining informed consent, [name] was placed in the  left lateral decubitus position.  I administered intravenous Demerol and  intravenous Versed to achieve conscious sedation for the procedure.  The  patient's blood pressure, oxygen saturation, and cardiac rhythm were  monitored throughout the procedure and documented in the medical record.   Anal inspection and digital rectal exam were normal.  The Olympus adjustable  pediatric colonoscope was introduced into the rectum and advanced to the  cecum.  A normal-appearing ileocecal valve and appendiceal orifice were  identified.  Colonic preparation for the exam today was excellent.   Rectum.  [name] has many diminutive less than 1 mm hyperplastic-appearing  polyps in the rectum, which were not removed.   Sigmoid colon and descending colon.  At 35 cm from the anal verge, two 2 mm  sessile polyps were removed with the electrocautery snare.  One polyp was  removed with the cold snare, and the second polyp was removed with the  electrocautery snare.  Only one polyp was retrieved for pathologic  evaluation.   Splenic flexure normal.   Transverse colon normal.   Hepatic flexure normal.   Ascending colon normal.   Cecum and  ileocecal valve normal.   ASSESSMENT:  1.  Two small polyps were removed from the distal sigmoid colon; only one      polyp was retrieved for pathologic      evaluation.  2.  There are a few scattered hyperplastic-appearing diminutive less than 1      mm size polyps in the rectum, which were not removed.      MJ/MEDQ  D:  08/07/2004  T:  08/07/2004  Job:  [id number]   cc:   Thora Lance, M.D.  [address]  [place]  [place] [address]  Fax: [phone]

## 2010-08-30 NOTE — Op Note (Signed)
NAME:  PRESLEIGH, FELDSTEIN                          ACCOUNT NO.:  [id number]   MEDICAL RECORD NO.:  [id number]                   PATIENT TYPE:  INP   LOCATION:  3172                                 FACILITY:  [facility]   PHYSICIAN:  Hewitt Shorts, M.D.            DATE OF BIRTH:  [birth year]   DATE OF PROCEDURE:  11/21/2002  DATE OF DISCHARGE:                                 OPERATIVE REPORT   PREOPERATIVE DIAGNOSIS:  C5-6 pseudoarthrosis.  C6-7 spondylolisthesis,  degenerative disk disease, radiculopathy.   POSTOPERATIVE DIAGNOSIS:  C5-6 pseudoarthrosis.  C6-7 spondylolisthesis,  degenerative disk disease, radiculopathy.   PROCEDURE:  1. Removal of Premier plates from C3 to C6.  2. C5-6 anterior cervical decompression and arthrodesis with iliac crest     allograft.  3. C6-7 anterior cervical decompression and arthrodesis with iliac crest     allograft.  4. C5 to C7 Premier cervical plating.   SURGEON:  Hewitt Shorts, M.D.   ASSISTANT:  Dr. Derryl Harbor.   ANESTHESIA:  General endotracheal.   INDICATIONS FOR PROCEDURE:  The patient is a 77 year old woman who had  undergone a three level, C3-4, C4-5, and C5-6 anterior cervical diskectomy  and arthrodesis in November of 2003.  She developed left cervical radicular  pain this past spring and workup included x-rays, MRI scan, and myelogram  and post myelogram CT scan.  These revealed a pseudoarthrosis at the C5-6  level as well as a progressive spondylolisthesis at C6-7 with neuroforaminal  encroachment on the left side corresponding to a radiculopathy.  The  decision was made to proceed with removal of cervical plate, revision of the  arthrodesis at C5-6 and C6-7 ACDF.   DESCRIPTION OF PROCEDURE:  The patient was taken to the operating room and  placed under general endotracheal anesthesia.  The patient was placed in 10  pounds of Holter traction.  The neck was prepped with Betadine soap and  solution and draped in a sterile  fashion.  The previous oblique right  cervical incision was infiltrated with local anesthetic with epinephrine and  reopened.  Dissection was carried down to the subcutaneous tissue and  platysma and in-depth dissection was carried out through an avascular plane  through the sternocleidomastoid, carotid artery, and jugular vein laterally  and trachea and esophagus medially.  The ventral aspect of the vertebral  column identified.  We identified the cervical plate and carefully dissected  the overlying scar tissue.  We exposed the margins of the plate and relaxed  the screws that secured the locking plates.  The locking plate was then  eased down exposing the eight screws.  We removed the screws and plate off  the ventral aspect of the vertebral column.  We then identified the C6-7  disk space level. The disk was markedly degenerated and we excised the  overlying annular fibers and entered into the disk space and removed the  remaining degenerated  disk material.  At C5-6 we identified the  pseudoarthrosis. The microscope was draped and brought into the field to  provide instant magnification, illumination, and visualization.  The  remainder of the decompression and diskectomy was performed using  microdissection with microdissection technique.  The spondylitic overgrowth  at this level were related to the pseudoarthrosis associated with nonunion  was carefully removed using Micromax drill and Kerrison punches.  The  endplates of the corresponding vertebral body were prepared using the  Micromax drill to remove the pseudoarthrotic material and to be able to get  back to bony surface.  Once decompression and endplate preparation was  completed at the C5-6 level, we returned to the C6-7 level.  Posterior  spondylitic overgrowth was carefully removed using the Micromax drill as  well as the 2 mm Kerrison punches and thin footplate.  We were able to  remove the posterior longitudinal ligaments  and decompress the spinal canal  and thecal sac.  Attention was then paid laterally to the foramina which  were likewise decompressed as were the nerve roots within them.  The  endplates of the vertebral bodies were prepared using the Micromax drill and  then once all of the preparations were completed, we hydrated in saline 6 mm  and an 8 mm graft.  The 6 mm graft was used at the C5-6 level.  It was  positioned in the anterior vertebral disk space and countersunk.  We then  placed the 8 mm graft at the C6-7 level.  Again it was placed within the  disk space and countersunk.  Once both grafts were in place, we selected a  Premier cervical plate that was positioned over the fusion construct. We  retapped the holes at C5 and used 4.5 x 13 mm Rescue screws at that level.  We then placed a pair of holes at the C6-7 level. The holes were tapped and  a 4.0 x 13 mm screws placed.  The intervening holes within the plate fell  half over the C6 vertebra and half over the C6-7 graft.  It was felt that it  was not feasible to plate a screw at C6.  X-rays were taken and the screws  were in good position at C5.  We could not visualize the C7 level well  despite a number of attempts.  However under direct vision, the screws and  plates were in good position.  We went ahead and locked the locking plate  down and then the wound was irrigated with bacitracin solution, checked for  hemostasis which was established, and then we proceeded with closure.  The  platysma layer was closed with interrupted inverted 2-0 undyed Vicryl  sutures.  The subcutaneous and subcuticular layer were closed with  interrupted inverted 3-0 undyed Vicryl sutures. The skin was reapproximated  with Dermabond. The patient tolerated the procedure well and estimated blood  loss was 75 mL.  Needle, sponge, and instrument count correct.  Following surgery, the patient was to be reversed from anesthetic, extubated, and  transferred to the  recovery room where she was noted to be moving all four  extremities to command.                                               Hewitt Shorts, M.D.    RWN/MEDQ  D:  11/21/2002  T:  11/21/2002  Job:  [id number]

## 2010-08-30 NOTE — Discharge Summary (Signed)
NAME:  Cathy, [name]                ACCOUNT NO.:  [id number]   MEDICAL RECORD NO.:  [id number]          PATIENT TYPE:  INP   LOCATION:  2017                         FACILITY:  [facility]   PHYSICIAN:  Lyn Records, M.D.   DATE OF BIRTH:  02/25/1932   DATE OF ADMISSION:  10/30/2005  DATE OF DISCHARGE:  11/10/2005                                 DISCHARGE SUMMARY   DISCHARGE DIAGNOSES:  1. Status post right ventricular infarct.  2. Coronary artery disease.  3. Hypertension.  4. Hypokalemia, treated.  5. Anemia, acute, status post transfusion.  6. Dizziness secondary eustachian tube dysfunction.  7. Long-term medication use.  8. High-grade atrioventricular block, with immediate myocardial      infarction, resolved prior to discharge spontaneously.  9. Hypotension, treated with pressor support, which was initially stopped,      and pressure normalized.  10.Hypoxia secondary to atelectasis, resolved.   [name] is a 77 year old female who presented with substernal chest pain  approximately 7 hours prior to her emergent cardiac catheterization.  Presentation to the emergency room showed an acute ST elevated myocardial  infarction in the inferior leads.  Dr. Deborah Chalk was on call and took the  patient emergently to the cardiac catheterization lab.  The patient was  found to have a totally occluded right coronary artery.  Circumflex and left  main were essentially normal.  The LAD had a 30-40% narrowing in the  midportion.  Otherwise, no critical flow problems.  Dr. Deborah Chalk attempted an  angioplasty but could not cross the lesion.  The patient was then taken to  CCU in stable condition.   The patient did have over the next 24 hours cardiogenic shock secondary to  acute RV failure secondary to RV infarct.  The patient was fluid  resuscitated.  The patient was started on dopamine and Levophed and  ultimately developed second-degree AV block.  The patient did require  temporary pacer, but  ultimately by November 03, 2005, the AV block had resolved.  Pacer was discontinued, and the pressor support was discontinued.   The patient seemed to do much better the next several days, and cardiac  rehab phase I was started, and phase II was recommended.  By November 10, 2005,  the patient was felt to be ready for discharge to home.  She was discharged  to home in stable condition.   During the patient's hospital stay, a 2D echocardiogram was performed, and  this showed a small left ventricle with an EF of 70% and no diagnostic  evidence of LV regional wall motion abnormalities.  There is trivial AI.  The right ventricle was mildly dilated, and RV systolic function was  reduced.  Akinesis of the entire right ventricular free wall was noted.   Lab studies during her stay included hemoglobin 11.1 hematocrit 32.8.  She  had acute anemia, with a hemoglobin of 8.7, a hematocrit 25.4, and did  receive 1 unit packed red blood cells.  Also, the patient did have volume  control the issues during her hospitalization.  Sodium 140, potassium 3.7,  BUN 8,  creatinine 0.9.  Hemoglobin A1c 6.3.  LFTs were normal.  Maximum CK  1118, with an MB fraction of 175, maximum troponin 20.19.  Total cholesterol  228, triglycerides 265, HDL 33, LDL 142, and therefore the patient was  placed on lipid-lowering agents.  By discharge, the patient's EKG showed Q-  waves in the inferior leads, with nonspecific ST-T wave changes in the  lateral leads.  Normal sinus rhythm, with first-degree AV block.   The patient was discharged home in stable and improved condition on  following medications:  Maxzide 1 tablet daily, Coreg 6.25 mg 1/2 tablet  p.o. b.i.d., Nexium 40 mg a day, Lopid 600 mg b.i.d., Zetia 10 mg a day,  sublingual nitroglycerin p.r.n. pain, enteric coated aspirin 325 mg a day,  Antivert p.r.n., Magic Mouthwash q.i.d. as needed for sore may, Rozerem and  Allegra as needed.  Follow up with Dr. Katrinka Blazing on November 17, 2005 for initial  evaluation, and then follow up on November 27, 2005 at 2:00 p.m. with an  appointment for Dr. Katrinka Blazing.  No lifting over 10 pounds for 1 week.  Increase  activity as per cardiac rehabilitation.  Cleaning of her catheterization  site  gently with soap and water.  Call for any questions or concerns.      Guy Franco, P.A.      Lyn Records, M.D.  Electronically Signed    LB/MEDQ  D:  02/02/2006  T:  02/03/2006  Job:  [id number]

## 2010-08-30 NOTE — Discharge Summary (Signed)
NAME:  [name], [name]                ACCOUNT NO.:  [id number]   MEDICAL RECORD NO.:  [id number]          PATIENT TYPE:  INP   LOCATION:  2008                         FACILITY:  [facility]   PHYSICIAN:  Lyn Records, M.D.   DATE OF BIRTH:  12/10/1931   DATE OF ADMISSION:  07/29/2006  DATE OF DISCHARGE:  07/31/2006                               DISCHARGE SUMMARY   ADMISSION DIAGNOSIS:  Chest pain   DISCHARGE DIAGNOSES:  1. Chest pain, status post cardiac catheterization with totally      occluded right coronary artery with left-to-right collaterals,      moderate left anterior descending artery disease with 50-60% mid      LAD stenosis, circumflex was widely patent, July 30, 2006.  2. Left renal artery with 60% ostial stenosis via cardiac      catheterization, July 30, 2006.  3. Normal LV function with an ejection fraction of 60% via cardiac      catheterization, July 30, 2006.  4. Hypertension, controlled.  5. Dyslipidemia.  6. Diabetes mellitus.  7. Seasonal allergies.  8. Coronary artery disease with previous right ventricular myocardial      infarction, complicated by shock and complete heart block, resolved      June 2007   CONSULTATIONS:  None.   PROCEDURES:  On April17, 2008:  1. Left heart catheterization.  2. Selective coronary angiography.  3. Left ventriculography.  4. Abdominal aortography performed because of significant and somewhat      labile hypertension.   HOSPITAL COURSE:  [name] is a 77 year old female with a known  history of coronary artery disease, with a totally occluded RCA with  left to right collaterals.  She had a previous history of an RV MI in  June 2007.  She also has a history of dyslipidemia and hypertension.  She was admitted to the [facility] on July 29, 2006, with a  chief complaint of chest pain.  A 12-lead EKG revealed a normal sinus  rhythm with a ventricular rate of 63 beats per minute.  There was no  evidence of acute  ischemic changes.  Point of care enzymes were  negative.  Serial cardiac enzymes were negative x4, with a peak troponin  of 0.01.  The patient was started on IV nitroglycerin and IV heparin,  both of which were later discontinued.  She underwent a cardiac  catheterization on July 30, 2006, which revealed a 100% occluded RCA  with left-to-right collaterals, moderate LAD disease with 50-60%  stenosis, and a widely patent circumflex.  Left renal artery stenosis  was noted at 60% at the ostium.  There was normal LV function with an EF  of 60%.  The procedure was performed by Dr. Verdis Prime, and was well  tolerated by the patient with a minimal estimated blood loss.  During  the remainder of the admission, she had no further complaints of chest  pain or groin pain.  Her groin was stable without evidence of bleeding,  hematoma, or pseudoaneurysm.  She was observed overnight following the  cardiac catheterization procedure and completed  IV hydration and  bedrest.  She was discharged to home in stable condition on July 31, 2006, with the addition of Norvasc 2.5 mg daily to her medical regimen,  and her Coreg was increased to 25 mg twice daily.  She was seen and  examined by Dr. Verdis Prime who was in agreement with the discharge  decision.   LABORATORY DATA:  White blood count 8.6, hemoglobin 12.1, hematocrit  35.6, platelets 237,000.  Heparin level of 0.51.  Fasting lipid panel  revealed total cholesterol of 154, triglycerides 348, HDL 31, LDL 53.  BNP 161.  Myoglobin 73.8, CK-MB 1, troponin I less than 0.05.  CK total  69, 61, 74, and 62; CK-MB 1.9, 1.6, 1.4, and 1.4; troponin I of 0.01 x4.  Potassium 4.2, sodium 140, chloride 105, CO2 30, glucose 145, BUN 19,  creatinine 1.02.   X-RAYS:  As stated in the hospital course.   ELECTROCARDIOGRAM:  A 12-lead EKG, April 17, 2008m revealed normal sinus  rhythm with a ventricular rate of 68 beats per minute.  There was no  evidence of acute  ischemic changes.   CONDITION ON DISCHARGE:  Stable.   DISCHARGE MEDICATIONS:  1. Aspirin 325 mg daily.  2. Simvastatin 40 mg daily.  3. Potassium chloride 20 mEq daily.  4. Triamterene/HCTZ 37.5/25 mg daily.  5. Coreg 25 mg twice daily.  This represented an increase in dosage; a      prescription was given with refills.  6. Norvasc 2.5 mg daily.  This represented a new prescription; a      prescription was given with refills.  7. Allegra 180 mg daily.  8. Nexium 40 mg daily.  9. Sublingual nitroglycerin 0.4 mg daily.   DISCHARGE INSTRUCTIONS:  1. No lifting greater than 10 pounds for one week.  2. No driving for 1 day.  3. Continue a low-salt, low-fat, and low-cholesterol, as well as low-      carbohydrate diet.  4. Avoid straining.  5. Stop any activity that causes chest pain, shortness of breath,      dizziness, sweating or excessive weakness.  6. Bathe groin area with warm soap and water.  Do not scrub area.   FOLLOWUP ARRANGEMENTS:  A followup appointment with Dr. Verdis Prime at  [facility] will remain as scheduled for Aug 31, 2006 at 1:45 p.m.  The patient is scheduled to call to [phone], if she is unable to make  the scheduled appointment or needs an appointment sooner than the  scheduled date.      Tylene Fantasia, [place]      Lyn Records, M.D.  Electronically Signed   RDM/MEDQ  D:  07/31/2006  T:  08/01/2006  Job:  [id number]   cc:   Thora Lance, M.D.

## 2010-08-30 NOTE — H&P (Signed)
NAME:  [name], [name]                          ACCOUNT NO.:  [id number]   MEDICAL RECORD NO.:  [id number]                   PATIENT TYPE:  OBV   LOCATION:  4782                                 FACILITY:  [facility]   PHYSICIAN:  Jamison Neighbor, M.D.               DATE OF BIRTH:  1931-06-13   DATE OF ADMISSION:  07/18/2003  DATE OF DISCHARGE:                                HISTORY & PHYSICAL   ADMIT DIAGNOSES:  Cystocele and rectocele.   CHIEF COMPLAINT:  Symptomatic cystocele and rectocele.   HISTORY OF PRESENT ILLNESS:  Ms. Defoor is a pleasant 77 year old female, who  is complaining of trouble walking due to an uncomfortable feeling in her  vagina.  Specifically, she states she has a bulge consistent with recurrent  cystocele.  In the past, Ms. Zaun has had problems with chronic urinary  tract infections requiring antibiotic prophylaxis.  She does have a history  of previous bladder surgery many years ago, including a vaginal vault  suspension which subsequently was taken down.  The patient had a recurrent  vaginal vault prolapse which was corrected with a vaginosacropexy.   PAST MEDICAL HISTORY:  1. High blood pressure.  2. Diabetes mellitus.  3. Bladder prolapse.  4. Chronic urinary tract infections.  5. GERD.   PAST SURGICAL HISTORY:  1. Breast reduction.  2. Cholecystectomy.  3. Vaginal cyst and RAZ bladder tack.  4. Vaginosacropexy.  5. Repair of spinal cervical disk.   FAMILY HISTORY:  The patient's father deceased of congestive heart failure  at age 27.  The patient's mother deceased of a CVA at age 105.  The patient  has a significant family history of coronary artery disease.  There is no  family history of genitourinary malignancy.   SOCIAL HISTORY:  The patient quit smoking in 1989.  The patient denies any  use of alcohol or illicit drug abuse.   REVIEW OF SYSTEMS:  Review of systems is pertinent for occasional dizziness,  bladder prolapse, as well as urinary  frequency.  The remainder of review of  systems is negative and reviewed.   MEDICATIONS:  1. Lopid.  2. Allegra.  3. Dilacor.  4. Maxzide.  5. Nexium.  6. Coated aspirin.   ALLERGIES:  1. MORPHINE.  2. CODEINE.  3. INAPSINE.  4. FENTANYL.  5. IVP DYE.   PHYSICAL EXAMINATION:  VITAL SIGNS:  Stable, afebrile.  GENERAL:  Well-developed, well-nourished, no apparent distress.  HEENT:  Normocephalic, atraumatic.  Pupils equal, round, and reactive to  light and accommodation.  Extraocular movements are intact.  The oropharynx  is clear.  NECK:  Supple.  No lymphadenopathy.  There is no JVD.  There are no carotid  bruits bilaterally.  CARDIOVASCULAR:  Regular rate and rhythm.  No murmurs, gallops, or rubs.  PULMONARY:  Clear to auscultation bilaterally.  ABDOMEN:  Soft, nondistended, nontender, with positive bowel sounds.  No  hepatosplenomegaly.  GU:  Normal-appearing female genitalia.  There is a grade 2 cystocele  present with a grade 1 rectocele.  There is approximately a 1-2 cm sebaceous  cyst on the inner portion of the right thigh.  EXTREMITIES:  No cyanosis, clubbing, or edema.  NEUROLOGIC:  Cranial nerves 2-12 intact.  Motor is 5/5 throughout.  Sensation is grossly nonfocal.   IMPRESSION:  1. This is a 77 year old female with a history of previous bladder surgery     in the past.  It appears at this time, she has a recurrent cystocele     approximately a grade 2 as well as a grade 1 rectocele.  The patient is     quite symptomatic from this, complaining she is quite uncomfortable and     having difficulty walking.  Therefore, she has been consented on the     risks, benefits, and alternatives of undergoing an anterior as well as     posterior vaginal vault repair as well as removal of this sebaceous cyst.     The patient agrees and is willing to proceed.  2. She will be admitted following both an anterior cystocele as well as a     posterior rectocele repair.  In the  same setting, we will remove the     patient's sebaceous cyst on her right inner thigh.  3. She will be admitted postoperatively for a probable 23 hour admission.     Thyra Breed, MD                            Jamison Neighbor, M.D.    EG/MEDQ  D:  07/18/2003  T:  07/19/2003  Job:  [id number]

## 2010-08-30 NOTE — Op Note (Signed)
NAME:  [name], [name]                          ACCOUNT NO.:  [id number]   MEDICAL RECORD NO.:  [id number]                   PATIENT TYPE:  INP   LOCATION:  3172                                 FACILITY:  [facility]   PHYSICIAN:  Hewitt Shorts, M.D.            DATE OF BIRTH:  Nov 30, 1931   DATE OF PROCEDURE:  03/03/2002  DATE OF DISCHARGE:                                 OPERATIVE REPORT   PREOPERATIVE DIAGNOSIS:  Cervical stenosis, spondylosis, degenerative disk  disease, dynamic and static listhesis and myeloradiculopathy.   POSTOPERATIVE DIAGNOSIS:  Cervical stenosis, spondylosis, degenerative disk  disease, dynamic and static lis thesis and myeloradiculopathy.   OPERATION PERFORMED:  C3-4, C4-5 and C5-6 anterior cervical diskectomy and  arthrodesis with allograft and Premier cervical plating.   SURGEON:  Hewitt Shorts, M.D.   ASSISTANT:  Stefani Dama, M.D.   ANESTHESIA:  General endotracheal.   INDICATIONS FOR PROCEDURE:  The patient is a 77 year old woman who presented  with myeloradiculopathy.  She had weakness of her left triceps, radicular  pain into the right upper extremity and hyperreflexia and upgoing toes in  the lower extremities.  Decision was made to proceed with decompression and  arthrodesis.   DESCRIPTION OF PROCEDURE:  The patient was brought to the operating room and  placed under general endotracheal anesthesia.  The patient was placed in 10  pounds of halter traction and the neck was prepped with Betadine soap and  solution and draped in sterile fashion.  An oblique incision was made on the  left side of the neck paralleling the anterior border of the left  sternocleidomastoid.  The line of the incision was infiltrated with local  anesthetic with epinephrine and incision was carried down to the  subcutaneous tissues and platysma.  Bipolar cautery was used to maintain  hemostasis.  Dissection was carried down to an avascular plane leaving the  sternocleidomastoid, carotid artery and jugular vein laterally and trachea  and esophagus medially.  The ventral aspect of the vertebral column was  identified and localizing x-ray taken.  C3-4,  C4-5 and C5-6 intervertebral  disk space identified.  Diskectomy was begun at each level with incision of  the annulus continued with microcurets and pituitary rongeurs.  The  cartilaginous end plates of the corresponding vertebrae were removed using  the Micromax drill and microcurets.  Anterior osteophytic overgrowth was  removed using the Micromax drill and an osteophyte removal tool.  With the  magnification, lighting and visualization provided by the operating  microscope, we proceeded with the posterior decompression.  Posterior  osteophytic overgrowth was removed using the Micromax drill as well as a 2  mm Kerrison punch with a thin foot plate.  At each level, decompression of  the spondylitic disk protrusion and thecal sac and nerve root decompression  was performed for the removal of the spondylitic overgrowth.  We  decompressed the spinal canal and  foramina bilaterally at each level.  Once  the thecal sac and nerve roots were decompressed bilaterally at each level.  Hemostasis was established with the use of Gelfoam soaked in thrombin.  All  the Gelfoam was removed prior to proceeding with the arthrodesis.  At each  level we used a 6 mm in height graft of Tutogen, unicortical patellar graft.  Each graft was positioned in the intervertebral disk space and countersunk.  Once all three grafts were in position and decompression completed, we  proceeded with plating using a 6 mm Premier anterior  cervical plate.  This  was positioned over the fusion construct and secured to each of the  vertebrae with a pair of 4.0 x 13 mm screws.  Each screw hole was drilled,  screws were placed in an alternating fashion and after all eight screws were  placed and fully secured, an x-ray was taken.  The graft  was in good  position, the plate and screws were in good position, the alignment was  good.  We then irrigated the wound, checked for hemostasis which was  established and confirmed.  We secured the locking system and then proceeded  with closure.  The platysma was closed with interrupted undyed 2-0 Vicryl  sutures.  The subcutaneous and subcuticular layer were closed with inverted  interrupted 3-0 undyed Vicryl sutures and the skin edges were approximated  with Dermabond.  The patient tolerated the procedure well.  The estimated  blood loss was 50 cc.  Sponge, needle and instrument counts were correct.  Following surgery, the patient is to be placed in an Aspen collar, reversed  from anesthetic, extubated and transferred to the recovery room for further  care.                                                 Hewitt Shorts, M.D.    RWN/MEDQ  D:  03/03/2002  T:  03/03/2002  Job:  [id number]

## 2010-08-30 NOTE — H&P (Signed)
NAME:  [name], [name]                ACCOUNT NO.:  [id number]   MEDICAL RECORD NO.:  [id number]          PATIENT TYPE:   LOCATION:                                 FACILITY:   PHYSICIAN:  Melissa L. Ladona Ridgel, MD       DATE OF BIRTH:   DATE OF ADMISSION:  05/24/2006  DATE OF DISCHARGE:                              HISTORY & PHYSICAL   CHIEF COMPLAINT:  Atypical chest pain, which is being described as  heaviness down both arms, and shortness of breath.   PRIMARY CARE PHYSICIAN:  Dr. Kirby Funk.   HISTORY OF PRESENT ILLNESS:  The patient is a 77 year old white female  with a history of myocardial infarction in August of 2007.  She awoke  this morning with the sensation of heaviness in both arms.  She noted  that while brushing her teeth she was short of breath and subsequently  noted elevated blood pressures.  She called [facility] and was  instructed to take an additional Coreg and as well as nitroglycerin and  ultimately was instructed to come to the hospital for further  evaluation.   REVIEW OF SYSTEMS:  Reveals still a heaviness in both arms, still short  of breath.  She denies any overt anterior chest pain, no nausea, no  changes in vision, no diaphoresis.  All other review of systems appear  to be negative.   PAST MEDICAL HISTORY:  1. Diet-controlled diabetes, for which she walks 2 miles a day.  She      checks her blood sugars at night.  2. She had a right ventricular infarct in August of 2007 resulting in      cardiogenic shock, temporary atrioventricular block and had quite a      rocky course during that stay.   PAST SURGICAL HISTORY:  1. Left eardrum surgery.  2. Rhinoplasty.  3. Breast reduction.  4. Cholecystectomy.  5. Hysterectomy.  6. Bladder tack once, with revision x2.  7. Prolapsed uterus.  8. Cervical plating of the C5-6 vertebra.   Patient quit smoking greater than 20 years ago.  She denies ethanol.  She is a retired Garment/textile technologist.   FAMILY  HISTORY:  1. Mom had stroke.  2. Dad had congestive heart failure and myocardial infarction.   ALLERGIES:  1. MORPHINE.  2. CODEINE.  3. FENTANYL.  4. NAPROSYN.   CURRENT MEDICATIONS:  1. Maxzide 37.5/25 mg daily.  2. Coreg 6.25 mg q.12.  3. Nexium 40 mg daily.  4. Lopid 600 mg twice daily.  5. Vytorin has been discontinued.  6. Aspirin 325 mg daily.  7. Macrodantin 50 mg daily.  8. Allegra 180 mg daily.  9. Potassium chloride 20 mg.  10.Vitamin E.  11.Cod Liver Oil.  12.Ocuvite.  13.Folic Acid.  14.Vitamin B6.  15.Antivert 12.5 mg q.6h. p.r.n. for dizziness.   VITAL SIGNS:  Temperature is 98.7, blood pressure 150/84 in the left  arm, 174/84 in the right arm, pulse is 85, respirations 20, saturation  97%.  GENERAL:  She is in no acute distress.  However, she still remains  with  the sensation of shortness of breath and heaviness in her arms.  She is  normocephalic, atraumatic.  Pupils equal, round, reactive to light.  Extraocular muscles appear to be intact.  Mucous membranes are moist.  She has a dental plate in the upper area.  She has no bruits.  CHEST:  Clear to auscultation.  CARDIOVASCULAR:  Regular rate and rhythm, positive S1, S2.  No S3, no  S4.  No murmur, rub or gallops.  ABDOMEN:  Soft, nontender, nondistended with positive bowel sounds.  EXTREMITIES:  Pulses 2+, no cyanosis, clubbing or edema.  NEUROLOGIC:  She is awake, alert, oriented x3.  Cranial nerves II-XII  appear to be intact.  Power is 5/5.   LABORATORIES:  Reveal a sodium of 140, potassium 4, chloride 107, CO2  28, BUN 22, creatinine 1.  There is no CBC.  Point of care enzymes are  negative x2.  A PT, INR 12.8 and 1 respectively.  Her D-dimer 0.24.  Chest x-ray shows bronchitic changes with right atelectasis.  EKG shows  89 b.p.m. with Qs in 2, 3 and AVF, no acute ST-T wave changes are noted.  Review of the old cath report from August of last year reveals a totally  occluded RCA that was unable  to be stented, a left main and circ had  normal vessels and her LAD has a 30-40% mid lesion.   ASSESSMENT AND PLAN:  This is a 77 year old white female with a past  medical history for myocardial infarction with right ventricular  infarction in August of 2007.  Presents with heaviness in both upper  extremities as well as the sensation of shortness of breath which is  somewhat relieved with nitroglycerin.  She is notably hypertensive in  the right arm greater than left arm.  1. Cardiovascular history of right ventricular infarct with a mid LAD      lesion still present.  She presents with shortness of breath and      heaviness.  Will start her on the nitroglycerin drip and titrate to      keep her systolic blood pressures 130-140, taking into account the      discrepancies in both arms.  Will continue her Coreg, check a 2D      echocardiogram and consult [facility] in the morning.  She      will have serial cardiac markers this evening and I will      anticoagulate her for acute coronary syndrome.  2. Pulmonary.  Sensation of shortness of breath with no increased D-      dimer, oxygen saturations are okay.  X-ray with only bronchitic      changes, will continue to follow this.  3. Gastrointestinal.  No history of gastroesophageal reflux disease or      other complaints.  4. Genitourinary.  No complaints of dysuria.  5. Endocrine.  Diet controlled diabetes.  I will keep sliding scale      insulin available and will check her TSH.      Melissa L. Ladona Ridgel, MD  Electronically Signed     MLT/MEDQ  D:  05/24/2006  T:  05/24/2006  Job:  [id number]   cc:   Thora Lance, M.D.

## 2010-08-30 NOTE — Cardiovascular Report (Signed)
NAME:  [name], [name]                ACCOUNT NO.:  [id number]   MEDICAL RECORD NO.:  [id number]          PATIENT TYPE:  INP   LOCATION:  2909                         FACILITY:  [facility]   PHYSICIAN:  Colleen Can. Deborah Chalk, M.D.DATE OF BIRTH:  1932/04/01   DATE OF PROCEDURE:  09/30/2005  DATE OF DISCHARGE:                              CARDIAC CATHETERIZATION   HISTORY:  [name] is a 77 year old female who presents with substernal  chest pain and approximately seven hours prior to the catheterization  procedure.  EKG showed changes compatible with an acute ST elevation  myocardial infarction.   PROCEDURE:  Left heart catheterization with selective coronary angiography,  left ventricular angiography, attempted angioplasty of the right coronary  artery (unsuccessful crossing of the blockage in the right coronary artery  with the guidewire).   TYPE AND SITE OF ENTRY:  Percutaneous right femoral artery with AngioSeal.   CATHETERS:  A 6-French 4 curved Judkins right and left coronary catheters, 6-  French pigtail ventriculographic catheter.  Guide catheters included an out  of plane right coronary guide catheter, a Shepherd's crook coronary guide,  an L1 Amplatz.   Guide wires include a high-torque floppy, Prowater, Miracle Brothers 3, and  Yahoo.   CONTRAST MATERIAL:  Omnipaque.   MEDICATIONS GIVEN PRIOR TO THE PROCEDURE:  Heparin, Integrilin, Benadryl,  and Dilaudid.   COMMENTS:  Patient tolerated procedure well.   HEMODYNAMIC DATA:  1.  The aortic pressure was 150/55.  2.  LV was 138/7-14.  3.  There was no aortic valve gradient noted on pullback.   ANGIOGRAPHIC DATA:  There was heavy calcium in the aortic root near the  right coronary artery.   1.  The right coronary artery:  The right coronary artery is totally      occluded after a Shepherd's crook.  There was a small area of      calcification just beyond the stenosis but it did appear that there was      somewhat of a  tapered point leading to this occlusion but then there was      a blunt area of calcification beyond that.  Flow was not seen into the      distal right coronary artery.   1.  Left main coronary artery is essentially normal.  2.  Left circumflex:  Left circumflex had a large obtuse marginal and a      large posterolateral branch.  There are irregularities, but no      significant focal obstructive disease.   1.  Left anterior descending:  Left anterior descending was a long vessel      that crossed the apex and extended to the inferior wall.  It had      irregularities with approximately a 30-40% narrowing in the mid portion.      There was no critical focal narrowing.   Left ventricular angiogram was performed in the RAO position.  Overall  cardiac size was normal.  There was only minimal inferior basal hypokinesia.   ANGIOPLASTY ATTEMPT:  We used multiple guide catheters and there was a  Shepherd's crook to the native right coronary artery.  We could not tease  the guidewire across the obstructed area and initially had only fair back-  up.  However, with the use of the Shepherd's crook right and subsequently  the L1 Amplatz we did have satisfactory back-up and were quite aggressive  with guidewire manipulation using the stiffer wires of the Miracle Brothers  and the Yahoo.  We did not use a hydrophilic wire.  There appeared to  be some degree of calcification.  The patient remained pain-free at the end  of the procedure.  Blood pressure was satisfactory.  She was transferred to  CCU in stable condition.  AngioSeal was used and initially appeared to have  a satisfactory result but there was a resultant hematoma after the patient  was moved and attempted to go back to the in taking her back to the CCU.      Colleen Can. Deborah Chalk, M.D.  Electronically Signed     SNT/MEDQ  D:  10/30/2005  T:  10/30/2005  Job:  [id number]   cc:   Thora Lance, M.D.  Fax: [phone]

## 2010-10-15 ENCOUNTER — Other Ambulatory Visit (HOSPITAL_COMMUNITY): Payer: Self-pay | Admitting: Obstetrics and Gynecology

## 2010-10-15 DIAGNOSIS — Z1231 Encounter for screening mammogram for malignant neoplasm of breast: Secondary | ICD-10-CM

## 2010-11-25 ENCOUNTER — Ambulatory Visit (HOSPITAL_COMMUNITY): Payer: Medicare Other

## 2010-12-09 ENCOUNTER — Ambulatory Visit (HOSPITAL_COMMUNITY)
Admission: RE | Admit: 2010-12-09 | Discharge: 2010-12-09 | Disposition: A | Discharge status: Home / Self Care | Payer: Medicare Other | Source: Ambulatory Visit | Attending: Obstetrics and Gynecology | Admitting: Obstetrics and Gynecology

## 2010-12-09 DIAGNOSIS — Z1231 Encounter for screening mammogram for malignant neoplasm of breast: Secondary | ICD-10-CM | POA: Insufficient documentation

## 2011-01-10 LAB — DIFFERENTIAL
Basophils Absolute: 0
Basophils Relative: 0
Eosinophils Absolute: 0.1
Monocytes Relative: 7
Neutro Abs: 4.2
Neutrophils Relative %: 70

## 2011-01-10 LAB — CBC
MCHC: 35.1
MCV: 85.1
RDW: 13.1

## 2011-01-10 LAB — BASIC METABOLIC PANEL
BUN: 37 — ABNORMAL HIGH
CO2: 25
Calcium: 9.3
Chloride: 104
Creatinine, Ser: 1.36 — ABNORMAL HIGH
Glucose, Bld: 195 — ABNORMAL HIGH

## 2011-01-24 LAB — I-STAT 8, (EC8 V) (CONVERTED LAB)
BUN: 28 — ABNORMAL HIGH
Bicarbonate: 27.6 — ABNORMAL HIGH
Chloride: 106
Glucose, Bld: 138 — ABNORMAL HIGH
HCT: 40
Hemoglobin: 13.6
Operator id: 277751
Potassium: 4
Sodium: 138

## 2011-01-24 LAB — CBC
HCT: 38.3
Hemoglobin: 13.3
MCHC: 34.7
MCV: 83.3
Platelets: 271
RDW: 13.9

## 2011-01-24 LAB — TROPONIN I
Troponin I: 0.02
Troponin I: 0.02
Troponin I: 0.02

## 2011-01-24 LAB — CK TOTAL AND CKMB (NOT AT ARMC)
CK, MB: 1.7
Relative Index: INVALID
Relative Index: INVALID
Relative Index: INVALID
Total CK: 59
Total CK: 63
Total CK: 66

## 2011-01-24 LAB — POCT CARDIAC MARKERS
CKMB, poc: <1 — ABNORMAL LOW
Myoglobin, poc: 76.8
Operator id: 277751
Troponin i, poc: <0.05

## 2011-01-24 LAB — HEPATIC FUNCTION PANEL
AST: 39 — ABNORMAL HIGH
Albumin: 3.9
Alkaline Phosphatase: 64
Bilirubin, Direct: 0.2
Total Bilirubin: 0.5

## 2011-01-24 LAB — DIFFERENTIAL
Basophils Absolute: 0
Basophils Relative: 0
Eosinophils Absolute: 0.2
Eosinophils Relative: 3
Monocytes Absolute: 0.7

## 2011-01-29 LAB — CATECHOLAMINES, FRACTIONATED, URINE, 24 HOUR
Catecholamines T: 65 ug/24hr (ref <100)
Epinephrine 24 Hr Urine: 5 ug/24hr (ref <20)
Norepinephrine 24 Hr Urine: 60 ug/24hr (ref <80)
Time-CATEUR: 24
Volume, Urine-CATEUR: 1200

## 2011-01-29 LAB — COMPREHENSIVE METABOLIC PANEL
ALT: 26
AST: 32
Alkaline Phosphatase: 74
CO2: 29
Calcium: 9.3
GFR calc Af Amer: >60
GFR calc non Af Amer: 56 — ABNORMAL LOW
Potassium: 4.4
Sodium: 139
Total Protein: 6.9

## 2011-01-29 LAB — VMA AND HVA, 24 HR UR
Creatinine, Urine mg/day-VMAUR: 984 mg/d (ref 500–1400)
Creatinine, Urine-mg/dL-VMAUR: 82 mg/dL
HVA, Urine: 3 mg/g (ref 0–8)
VMA, 24H Ur Adult: 4.4 mg/d (ref 0.0–7.0)
VMA, Urine: 5 mg/g (ref 0–6)

## 2011-01-29 LAB — TSH: TSH: 0.921

## 2011-01-29 LAB — DIFFERENTIAL
Eosinophils Absolute: 0.1
Eosinophils Relative: 2
Lymphs Abs: 1.4
Monocytes Relative: 8

## 2011-01-29 LAB — CBC
MCHC: 34
RBC: 4.57

## 2011-01-29 LAB — CARDIAC PANEL(CRET KIN+CKTOT+MB+TROPI)
CK, MB: 1.6
Relative Index: INVALID
Relative Index: INVALID
Troponin I: 0.01
Troponin I: 0.02
Troponin I: 0.02

## 2011-01-29 LAB — LIPID PANEL
Cholesterol: 163
HDL: 29 — ABNORMAL LOW
LDL Cholesterol: UNDETERMINED
Triglycerides: 453 — ABNORMAL HIGH

## 2011-01-29 LAB — CK TOTAL AND CKMB (NOT AT ARMC)
CK, MB: 1.9
Relative Index: INVALID

## 2011-01-29 LAB — ALDOSTERONE, URINE, 24 HOUR
Time-ALDU: 24 hr
Volume, Urine-ALDU: 1200

## 2011-01-29 LAB — CORTISOL, URINE, FREE: Volume, Urine-CORTUR: 1200

## 2011-11-13 ENCOUNTER — Other Ambulatory Visit (HOSPITAL_COMMUNITY): Payer: Self-pay | Admitting: Internal Medicine

## 2011-11-13 DIAGNOSIS — Z1231 Encounter for screening mammogram for malignant neoplasm of breast: Secondary | ICD-10-CM

## 2011-12-18 ENCOUNTER — Ambulatory Visit (HOSPITAL_COMMUNITY)
Admission: RE | Admit: 2011-12-18 | Discharge: 2011-12-18 | Disposition: A | Discharge status: Home / Self Care | Payer: Medicare Other | Source: Ambulatory Visit | Attending: Internal Medicine | Admitting: Internal Medicine

## 2011-12-18 DIAGNOSIS — Z1231 Encounter for screening mammogram for malignant neoplasm of breast: Secondary | ICD-10-CM | POA: Insufficient documentation

## 2011-12-21 ENCOUNTER — Encounter (HOSPITAL_COMMUNITY): Payer: Self-pay | Admitting: Internal Medicine

## 2011-12-21 ENCOUNTER — Emergency Department (HOSPITAL_COMMUNITY): Payer: Medicare Other

## 2011-12-21 ENCOUNTER — Observation Stay (HOSPITAL_COMMUNITY)
Admission: EM | Admit: 2011-12-21 | Discharge: 2011-12-22 | DRG: 305 | Disposition: A | Discharge status: Home / Self Care | Payer: Medicare Other | Attending: Internal Medicine | Admitting: Internal Medicine

## 2011-12-21 DIAGNOSIS — E119 Type 2 diabetes mellitus without complications: Secondary | ICD-10-CM | POA: Insufficient documentation

## 2011-12-21 DIAGNOSIS — I2510 Atherosclerotic heart disease of native coronary artery without angina pectoris: Secondary | ICD-10-CM | POA: Diagnosis present

## 2011-12-21 DIAGNOSIS — K30 Functional dyspepsia: Secondary | ICD-10-CM | POA: Diagnosis present

## 2011-12-21 DIAGNOSIS — K219 Gastro-esophageal reflux disease without esophagitis: Secondary | ICD-10-CM | POA: Diagnosis present

## 2011-12-21 DIAGNOSIS — I10 Essential (primary) hypertension: Secondary | ICD-10-CM | POA: Diagnosis present

## 2011-12-21 DIAGNOSIS — E785 Hyperlipidemia, unspecified: Secondary | ICD-10-CM | POA: Insufficient documentation

## 2011-12-21 DIAGNOSIS — R1013 Epigastric pain: Principal | ICD-10-CM

## 2011-12-21 DIAGNOSIS — I150 Renovascular hypertension: Secondary | ICD-10-CM | POA: Insufficient documentation

## 2011-12-21 DIAGNOSIS — K3189 Other diseases of stomach and duodenum: Principal | ICD-10-CM | POA: Insufficient documentation

## 2011-12-21 DIAGNOSIS — I701 Atherosclerosis of renal artery: Secondary | ICD-10-CM | POA: Insufficient documentation

## 2011-12-21 HISTORY — DX: Atherosclerotic heart disease of native coronary artery without angina pectoris: I25.10

## 2011-12-21 HISTORY — DX: Atherosclerosis of renal artery: I70.1

## 2011-12-21 HISTORY — DX: Gastro-esophageal reflux disease without esophagitis: K21.9

## 2011-12-21 HISTORY — DX: Hyperlipidemia, unspecified: E78.5

## 2011-12-21 HISTORY — DX: Essential (primary) hypertension: I10

## 2011-12-21 HISTORY — DX: Age-related osteoporosis without current pathological fracture: M81.0

## 2011-12-21 LAB — POCT I-STAT, CHEM 8
Creatinine, Ser: 1.3 mg/dL — ABNORMAL HIGH (ref 0.50–1.10)
Hemoglobin: 12.9 g/dL (ref 12.0–15.0)
Sodium: 138 mEq/L (ref 135–145)
TCO2: 24 mmol/L (ref 0–100)

## 2011-12-21 LAB — POCT I-STAT TROPONIN I

## 2011-12-21 LAB — URINALYSIS, ROUTINE W REFLEX MICROSCOPIC
Nitrite: NEGATIVE
Protein, ur: NEGATIVE mg/dL
Specific Gravity, Urine: 1.014 (ref 1.005–1.030)
Urobilinogen, UA: 0.2 mg/dL (ref 0.0–1.0)

## 2011-12-21 LAB — GLUCOSE, CAPILLARY: Glucose-Capillary: 204 mg/dL — ABNORMAL HIGH (ref 70–99)

## 2011-12-21 LAB — URINE MICROSCOPIC-ADD ON

## 2011-12-21 MED ORDER — ALUM & MAG HYDROXIDE-SIMETH 200-200-20 MG/5ML PO SUSP: 30.0000 mL | Freq: Four times a day (QID) | ORAL | Status: DC | PRN

## 2011-12-21 MED ORDER — ONDANSETRON HCL 4 MG PO TABS: 4.0000 mg | ORAL_TABLET | Freq: Four times a day (QID) | ORAL | Status: DC | PRN

## 2011-12-21 MED ORDER — ASPIRIN 325 MG PO TABS
325.0000 mg | ORAL_TABLET | Freq: Every day | ORAL | Status: DC
  Filled 2011-12-21 (×2): qty 1

## 2011-12-21 MED ORDER — HYDRALAZINE HCL 20 MG/ML IJ SOLN: 5.0000 mg | Freq: Four times a day (QID) | INTRAMUSCULAR | Status: DC | PRN

## 2011-12-21 MED ORDER — CLONIDINE HCL 0.1 MG PO TABS
0.1000 mg | ORAL_TABLET | Freq: Once | ORAL | Status: AC
  Administered 2011-12-21: 0.1 mg via ORAL
  Filled 2011-12-21: qty 1

## 2011-12-21 MED ORDER — HEPARIN SODIUM (PORCINE) 5000 UNIT/ML IJ SOLN
5000.0000 [IU] | Freq: Three times a day (TID) | INTRAMUSCULAR | Status: DC
  Administered 2011-12-21 – 2011-12-22 (×2): 5000 [IU] via SUBCUTANEOUS
  Filled 2011-12-21 (×5): qty 1

## 2011-12-21 MED ORDER — IRBESARTAN 150 MG PO TABS: 150.0000 mg | ORAL_TABLET | Freq: Every day | ORAL | Status: DC

## 2011-12-21 MED ORDER — CARVEDILOL 25 MG PO TABS: 25.0000 mg | ORAL_TABLET | Freq: Two times a day (BID) | ORAL | Status: DC

## 2011-12-21 MED ORDER — SODIUM CHLORIDE 0.9 % IV SOLN
INTRAVENOUS | Status: DC
  Administered 2011-12-21: 23:00:00 via INTRAVENOUS

## 2011-12-21 MED ORDER — SODIUM CHLORIDE 0.9 % IJ SOLN
3.0000 mL | Freq: Two times a day (BID) | INTRAMUSCULAR | Status: DC
  Administered 2011-12-21: 3 mL via INTRAVENOUS

## 2011-12-21 MED ORDER — ONDANSETRON HCL 4 MG/2ML IJ SOLN: 4.0000 mg | Freq: Four times a day (QID) | INTRAMUSCULAR | Status: DC | PRN

## 2011-12-21 MED ORDER — CARVEDILOL 25 MG PO TABS
25.0000 mg | ORAL_TABLET | Freq: Two times a day (BID) | ORAL | Status: DC
  Administered 2011-12-21 – 2011-12-22 (×2): 25 mg via ORAL
  Filled 2011-12-21 (×6): qty 1

## 2011-12-21 MED ORDER — PANTOPRAZOLE SODIUM 40 MG PO TBEC
40.0000 mg | DELAYED_RELEASE_TABLET | Freq: Two times a day (BID) | ORAL | Status: DC
  Administered 2011-12-21 – 2011-12-22 (×2): 40 mg via ORAL
  Filled 2011-12-21 (×2): qty 1

## 2011-12-21 MED ORDER — PANTOPRAZOLE SODIUM 40 MG PO TBEC: 40.0000 mg | DELAYED_RELEASE_TABLET | Freq: Every day | ORAL | Status: DC

## 2011-12-21 MED ORDER — NITROGLYCERIN 0.4 MG SL SUBL: 0.4000 mg | SUBLINGUAL_TABLET | SUBLINGUAL | Status: DC | PRN

## 2011-12-21 MED ORDER — SIMVASTATIN 20 MG PO TABS
20.0000 mg | ORAL_TABLET | Freq: Every day | ORAL | Status: DC
  Administered 2011-12-22: 20 mg via ORAL
  Filled 2011-12-21: qty 1

## 2011-12-21 MED ORDER — IRBESARTAN 300 MG PO TABS
300.0000 mg | ORAL_TABLET | Freq: Every day | ORAL | Status: DC
  Filled 2011-12-21: qty 1

## 2011-12-21 MED ORDER — TRIAMTERENE-HCTZ 37.5-25 MG PO TABS
1.0000 | ORAL_TABLET | Freq: Every day | ORAL | Status: DC
  Administered 2011-12-22: 0.5 via ORAL
  Filled 2011-12-21: qty 1

## 2011-12-21 MED ORDER — GI COCKTAIL ~~LOC~~
30.0000 mL | Freq: Once | ORAL | Status: AC
  Administered 2011-12-21: 30 mL via ORAL
  Filled 2011-12-21: qty 30

## 2011-12-21 NOTE — ED Notes (Signed)
Report  calld to 3000

## 2011-12-21 NOTE — ED Notes (Signed)
Pt c/o generalized weakness x 1 day with HTN. Pt states that she was told by her Cardiologist in 2007 that "if you don't ever feel good, or your BP spikes, you need to go to the ED." Pt presents today non-diaphoretic, clear lung sounds, resting with family at bedside.

## 2011-12-21 NOTE — ED Notes (Signed)
Food given 

## 2011-12-21 NOTE — ED Provider Notes (Signed)
History     CSN: [id number]  Arrival date & time 12/21/11  1631   First MD Initiated Contact with Patient 12/21/11 1639      Chief Complaint  Patient presents with  . Hypertension     HPI Per EMS pt states that she woke up this morning, and just "didn't feel good." States she took her BP and it was 230/110. BP with EMS was 170/90, given 4 baby aspirin. Denies SOB, chest pain. No diaphoresis  Past Medical History  Diagnosis Date  . CAD (coronary artery disease)   . Renal artery stenosis   . HTN (hypertension)   . Dyslipidemia   . DM (diabetes mellitus)   . GERD (gastroesophageal reflux disease)   . Osteoporosis     Past Surgical History  Procedure Date  . Right hip fx 2011   . Left wrist fx 2012   . Abdominal hysterectomy   . Cholecystectomy   . Bladder tac     x3  . Cardiac catheterization   . Neck sx   . Breast surgery     breast reduction 1985  . Left renal stent     History reviewed. No pertinent family history.  History  Substance Use Topics  . Smoking status: Former Games developer  . Smokeless tobacco: Not on file  . Alcohol Use: No    OB History    Grav Para Term Preterm Abortions TAB SAB Ect Mult Living                  Review of Systems  All other systems reviewed and are negative.    Allergies  Morphine and related; Actamin; Avapro; Cardio complete; Codeine; Dilaudid; and Fentanyl  Home Medications   Current Outpatient Rx  Name Route Sig Dispense Refill  . ASPIRIN 325 MG PO TABS Oral Take 325 mg by mouth at bedtime.    [name] CARVEDILOL 25 MG PO TABS Oral Take 25 mg by mouth 2 (two) times daily with a meal.    . ESOMEPRAZOLE MAGNESIUM 40 MG PO CPDR Oral Take 40 mg by mouth 2 (two) times daily.    [name] MECLIZINE HCL 25 MG PO TABS Oral Take 25 mg by mouth 3 (three) times daily as needed. For dizziness    . OLMESARTAN MEDOXOMIL 20 MG PO TABS Oral Take 20 mg by mouth 2 (two) times daily.    [name] PREMARIN 0.625 MG/GM VA CREA Oral Take 1 tablet by mouth BID  times 48H.    [name] SIMVASTATIN 20 MG PO TABS Oral Take 1 tablet by mouth daily.    . TRIAMTERENE-HCTZ 37.5-25 MG PO TABS Oral Take 0.5 tablets by mouth daily.    [name] CLONIDINE HCL 0.1 MG PO TABS Oral Take 1 tablet (0.1 mg total) by mouth 2 (two) times daily as needed (For systolic BP >160). 60 tablet 11    BP 149/62  Pulse 63  Temp 98 F (36.7 C) (Oral)  Resp 18  Ht 5\' 2"  (1.575 m)  Wt 128 lb (58.06 kg)  BMI 23.41 kg/m2  SpO2 96%  Physical Exam  Nursing note and vitals reviewed. Constitutional: She is oriented to person, place, and time. She appears well-developed. No distress.  HENT:  Head: Normocephalic and atraumatic.  Eyes: Pupils are equal, round, and reactive to light.  Neck: Normal range of motion.  Cardiovascular: Normal rate and intact distal pulses.         Sinus Rate=72 SINUS RHYTHM ~ normal P axis, V-rate 50- 99  PROBABLE INFERIOR INFARCT, OLD ~ Q>81mS, abnormal ST-T, II III aVF CONSIDER ANTERIOR INFARCT ~ diminished R <0.28mV V3 Abnormal ECG  Pulmonary/Chest: No respiratory distress.  Abdominal: Normal appearance. She exhibits no distension.  Musculoskeletal: Normal range of motion.  Neurological: She is alert and oriented to person, place, and time. No cranial nerve deficit.  Skin: Skin is warm and dry. No rash noted.  Psychiatric: She has a normal mood and affect. Her behavior is normal.    ED Course  Procedures (including critical care time) Scheduled Meds:    . DISCONTD: aspirin  325 mg Oral QHS  . DISCONTD: carvedilol  25 mg Oral BID WC  . DISCONTD: heparin  5,000 Units Subcutaneous Q8H  . DISCONTD: irbesartan  300 mg Oral Daily  . DISCONTD: pantoprazole  40 mg Oral BID AC  . DISCONTD: simvastatin  20 mg Oral Daily  . DISCONTD: sodium chloride  3 mL Intravenous Q12H  . DISCONTD: triamterene-hydrochlorothiazide  1 each Oral Daily   Continuous Infusions:    . DISCONTD: sodium chloride 50 mL/hr at 12/21/11 2309   PRN Meds:.DISCONTD: alum & mag  hydroxide-simeth, DISCONTD: hydrALAZINE, DISCONTD: nitroGLYCERIN, DISCONTD: ondansetron (ZOFRAN) IV, DISCONTD: ondansetron  Labs Reviewed  POCT I-STAT, CHEM 8 - Abnormal; Notable for the following:    BUN 33 (*)     Creatinine, Ser 1.30 (*)     Glucose, Bld 120 (*)     All other components within normal limits  CBC - Abnormal; Notable for the following:    Hemoglobin 11.8 (*)     HCT 35.1 (*)     All other components within normal limits  BASIC METABOLIC PANEL - Abnormal; Notable for the following:    Glucose, Bld 120 (*)     BUN 31 (*)     Creatinine, Ser 1.23 (*)     GFR calc non Af Amer 41 (*)     GFR calc Af Amer 48 (*)     All other components within normal limits  URINALYSIS, ROUTINE W REFLEX MICROSCOPIC - Abnormal; Notable for the following:    APPearance CLOUDY (*)     Hgb urine dipstick TRACE (*)     All other components within normal limits  GLUCOSE, CAPILLARY - Abnormal; Notable for the following:    Glucose-Capillary 204 (*)     All other components within normal limits  URINE MICROSCOPIC-ADD ON - Abnormal; Notable for the following:    Squamous Epithelial / LPF FEW (*)     Bacteria, UA MANY (*)     All other components within normal limits  GLUCOSE, CAPILLARY - Abnormal; Notable for the following:    Glucose-Capillary 137 (*)     All other components within normal limits  GLUCOSE, CAPILLARY - Abnormal; Notable for the following:    Glucose-Capillary 118 (*)     All other components within normal limits  POCT I-STAT TROPONIN I  TROPONIN I  TROPONIN I  URINE CULTURE   Dg Chest Portable 1 View  12/21/2011  *RADIOLOGY REPORT*  Clinical Data: Hypertension.  Indigestion.  PORTABLE CHEST - 1 VIEW  Comparison: 08/15/2009.  Findings: Normal sized heart.  Clear lungs with normal vascularity. Diffuse osteopenia and mild scoliosis.  Cervical spine fixation hardware.  IMPRESSION: No acute abnormality.   Original Report Authenticated By: Darrol Angel, M.D.      1.  Hypertension   2. Epigastric abdominal pain   3. CAD (coronary artery disease)   4. DM (diabetes mellitus)   5.  GERD (gastroesophageal reflux disease)   6. Indigestion   7. Renal artery stenosis   8. HTN (hypertension)       MDM   Blood pressure has decreased from the medication but patient still having symptoms of indigestion that are not relieved with GI cocktail.  Because of the age and previous history plan on admitting for serial enzymes and further evaluation.      Nelia Shi, MD 12/22/11 2040

## 2011-12-21 NOTE — H&P (Signed)
[facility]                                                                                    Patient Demographics  [name], is a 78 y.o. female  CSN: [id number]  MRN: [id number]  DOB - Nov 20, 1931  Admit Date - 12/21/2011  Outpatient Primary MD for the patient is No primary provider on file.   With History of -  Past Medical History  Diagnosis Date  . CAD (coronary artery disease)   . Renal artery stenosis   . HTN (hypertension)   . Dyslipidemia   . DM (diabetes mellitus)   . GERD (gastroesophageal reflux disease)        in for   Chief Complaint  Patient presents with  . Hypertension     HPI  [name]  is a 78 y.o. female, with above-mentioned past medical history, history of carotid disease status post cardiac cath in April 2008 with acute MI showing ejection fraction of 60% with moderate inferior hypokinesis and 60% mid LAD stenosis, history of diabetes mellitus diet-controlled, hypertension, dyslipidemia, and renal artery stenosis status post renal angiogram with left renal artery stent, presents with complaints of not feeling well, and indigestion, patient reports this afternoon she didn't feel well, had an episode of indigestion, where she checked her blood pressure where it was 220/110, was complaining of some mild headache then, and eyes any chest pain, but reports air this episode of indigestion resembles care presentation when she had her last heart attack, denies any shortness of breath palpitation, diaphoresis, nausea, patient EKG did not show any significant changes from previous, where she had inferior lead Q waves which are old, and her first troponin were negative, patient given 325 of aspirin, and air currently reports she's feeling much better.    Review of Systems    In addition to the HPI above, No Fever-chills, Had headache currently resolved, No changes with Vision or hearing, No problems swallowing food or Liquids, No Chest  pain, Cough or Shortness of Breath, complaints of indigestion. No Abdominal pain, No Nausea or Vommitting, Bowel movements are regular, No Blood in stool or Urine, No dysuria, No new skin rashes or bruises, No new joints pains-aches,  No new weakness, tingling, numbness in any extremity, No recent weight gain or loss, No polyuria, polydypsia or polyphagia, No significant Mental Stressors.  A full 10 point Review of Systems was done, except as stated above, all other Review of Systems were negative.   Social History Used to smoke for 20 years, quit smoking 20 years ago  Family History Significant for CVA in her mother and CAD in her father  Prior to Admission medications   Medication Sig Start Date End Date Taking? Authorizing Provider  aspirin 325 MG tablet Take 325 mg by mouth at bedtime.   Yes Historical Provider, MD  BENICAR 40 MG tablet Take 1 tablet by mouth Twice daily. 11/27/11  Yes Historical Provider, MD  NEXIUM 40 MG capsule Take 1 tablet by mouth daily. 11/06/11  Yes Historical Provider, MD  PREMARIN vaginal cream Take 1 tablet by mouth BID times 48H. 12/04/11  Yes Historical Provider,  MD  simvastatin (ZOCOR) 20 MG tablet Take 1 tablet by mouth daily. 10/14/11  Yes Historical Provider, MD  triamterene-hydrochlorothiazide (MAXZIDE-25) 37.5-25 MG per tablet Take 1 tablet by mouth daily. 11/13/11  Yes Historical Provider, MD    Allergies  Allergen Reactions  . Morphine And Related   . Actamin (Acetaminophen)   . Avapro (Irbesartan)   . Cardio Complete (Nutritional Supplements)   . Codeine   . Dilaudid (Hydromorphone Hcl)   . Fentanyl     Physical Exam  Vitals  Blood pressure 145/58, pulse 66, temperature 98.9 F (37.2 C), temperature source Oral, resp. rate 18, SpO2 96.00%.   1. General elderly female lying in bed in NAD,    2. Normal affect and insight, Not Suicidal or Homicidal, Awake Alert, Oriented X 3.  3. No F.N deficits, ALL C.Nerves Intact, Strength 5/5  all 4 extremities, Sensation intact all 4 extremities, Plantars down going.  4. Ears and Eyes appear Normal, Conjunctivae clear, PERRLA. Moist Oral Mucosa.  5. Supple Neck, No JVD, No cervical lymphadenopathy appriciated, No Carotid Bruits.  6. Symmetrical Chest wall movement, Good air movement bilaterally, CTAB.  7. RRR, No Gallops, Rubs or Murmurs, No Parasternal Heave.  8. Positive Bowel Sounds, Abdomen Soft, Non tender, No organomegaly appriciated,No rebound -guarding or rigidity.  9.  No Cyanosis, Normal Skin Turgor, No Skin Rash or Bruise.  10. Good muscle tone,  joints appear normal , no effusions, Normal ROM.  11. No Palpable Lymph Nodes in Neck or Axillae    Data Review  CBC  Lab 12/21/11 1719  WBC --  HGB 12.9  HCT 38.0  PLT --  MCV --  MCH --  MCHC --  RDW --  LYMPHSABS --  MONOABS --  EOSABS --  BASOSABS --  BANDABS --   ------------------------------------------------------------------------------------------------------------------  Chemistries   Lab 12/21/11 1719  NA 138  K 4.4  CL 107  CO2 --  GLUCOSE 120*  BUN 33*  CREATININE 1.30*  CALCIUM --  MG --  AST --  ALT --  ALKPHOS --  BILITOT --   ------------------------------------------------------------------------------------------------------------------ CrCl is unknown because there is no height on file for the current visit. ------------------------------------------------------------------------------------------------------------------ No results found for this basename: TSH,T4TOTAL,FREET3,T3FREE,THYROIDAB in the last 72 hours   Coagulation profile No results found for this basename: INR:5,PROTIME:5 in the last 168 hours ------------------------------------------------------------------------------------------------------------------- No results found for this basename: DDIMER:2 in the last 72  hours -------------------------------------------------------------------------------------------------------------------  Cardiac Enzymes No results found for this basename: CK:3,CKMB:3,TROPONINI:3,MYOGLOBIN:3 in the last 168 hours ------------------------------------------------------------------------------------------------------------------ No components found with this basename: POCBNP:3   ---------------------------------------------------------------------------------------------------------------  Urinalysis    Component Value Date/Time   COLORURINE YELLOW 06/25/2010 2323   APPEARANCEUR CLOUDY* 06/25/2010 2323   LABSPEC 1.013 06/25/2010 2323   PHURINE 6.0 06/25/2010 2323   GLUCOSEU NEGATIVE 06/25/2010 2323   HGBUR MODERATE* 06/25/2010 2323   BILIRUBINUR NEGATIVE 06/25/2010 2323   KETONESUR NEGATIVE 06/25/2010 2323   PROTEINUR 30* 06/25/2010 2323   UROBILINOGEN 0.2 06/25/2010 2323   NITRITE NEGATIVE 06/25/2010 2323   LEUKOCYTESUR MODERATE* 06/25/2010 2323    ----------------------------------------------------------------------------------------------------------------    Imaging results:   Dg Chest Portable 1 View  12/21/2011  *RADIOLOGY REPORT*  Clinical Data: Hypertension.  Indigestion.  PORTABLE CHEST - 1 VIEW  Comparison: 08/15/2009.  Findings: Normal sized heart.  Clear lungs with normal vascularity. Diffuse osteopenia and mild scoliosis.  Cervical spine fixation hardware.  IMPRESSION: No acute abnormality.   Original Report Authenticated By: Darrol Angel, M.D.  Mm Digital Screening  12/19/2011  *RADIOLOGY REPORT*  Clinical Data: Screening.  DIGITAL BILATERAL SCREENING MAMMOGRAM WITH CAD  Comparison:   Previous exams.  Findings: Two views of each breast demonstrate heterogeneously dense tissue.  In the left breast,  possible mass  warrants further imaging evaluation with  spot compression views and possibly ultrasound.  In the right breast, a possible mass warrants further  imaging evaluation withspot compression views and possibly ultrasound.  Images were processed with CAD.  IMPRESSION:  Further imaging evaluation is suggested for possible mass in the left breast. Further imaging evaluation is suggested for possible mass in the right breast.  RECOMMENDATION: Diagnostic mammogram and possibly ultrasoundof both breasts. (Code:FI-B-2M)  BI-RADS CATEGORY 0:  Incomplete.  Need additional imaging evaluation and/or prior mammograms for comparison.   Original Report Authenticated By: Sherian Rein, M.D.     My personal review of EKG: Rhythm NSR, inferior lead Q waves which are old Last echo Report    Assessment & Plan  Active Problems:  Indigestion  Renal artery stenosis  DM (diabetes mellitus)  CAD (coronary artery disease)  HTN (hypertension)  Hyperlipidemia  GERD (gastroesophageal reflux disease)    1. Indigestion, patient does not of chest pain, but giving her significant past medical history, and risk factors, and there similar presentation when she had her previous heart attack, we'll admit patient, we'll continue her on aspirin beta blockers and ARB and statin, will add air we'll admit to telemetry, and we'll cycle troponins.  2. Carotid artery disease, patient is following with cardiology Dr. Verdis Prime from [facility] cardiology,, continue with aspirin, statin, beta blockers, and ARB.  3. Renal artery stenosis, status post left renal artery stent  4. Diabetes mellitus, diet controlled, monitor CBGs  5. Hypertension, currently controlled, will continue home meds, and will have on when necessary IV  6. Hyperlipidemia, continue with statin  7. GERD, patient presentation may be due to GERD, will increase Protonix to twice a day, will have her on Mylanta as needed   DVT Prophylaxis Heparin   AM Labs Ordered, also please review Full Orders  Family Communication: Admission, patients condition and plan of care including tests being ordered have been  discussed with the patient and husband who indicate understanding and agree with the plan and Code Status.  Code Status full  Disposition Plan: home  Time spent in minutes :  Condition GUARDED  Randol Kern, Yasira Engelson M.D on 12/21/2011 at 8:13 PM   [facility] Office  [phone]

## 2011-12-21 NOTE — ED Notes (Signed)
Per EMS pt states that she woke up this morning, and just "didn't feel good." States she took her BP and it was 230/110. BP with EMS was 170/90, given 4 baby aspirin. Denies SOB, chest pain. No diaphoresis.

## 2011-12-22 ENCOUNTER — Encounter (HOSPITAL_COMMUNITY): Payer: Self-pay | Admitting: *Deleted

## 2011-12-22 DIAGNOSIS — R1013 Epigastric pain: Secondary | ICD-10-CM

## 2011-12-22 LAB — TROPONIN I
Troponin I: <0.3 ng/mL (ref <0.30)
Troponin I: <0.3 ng/mL (ref <0.30)

## 2011-12-22 LAB — CBC
MCHC: 33.6 g/dL (ref 30.0–36.0)
MCV: 86.2 fL (ref 78.0–100.0)
Platelets: 174 10*3/uL (ref 150–400)
RDW: 13.3 % (ref 11.5–15.5)
WBC: 6 10*3/uL (ref 4.0–10.5)

## 2011-12-22 LAB — BASIC METABOLIC PANEL
Calcium: 9.6 mg/dL (ref 8.4–10.5)
Chloride: 105 mEq/L (ref 96–112)
Creatinine, Ser: 1.23 mg/dL — ABNORMAL HIGH (ref 0.50–1.10)
GFR calc Af Amer: 48 mL/min — ABNORMAL LOW (ref >90)
GFR calc non Af Amer: 41 mL/min — ABNORMAL LOW (ref >90)

## 2011-12-22 MED ORDER — CLONIDINE HCL 0.1 MG PO TABS: 0.1000 mg | ORAL_TABLET | Freq: Two times a day (BID) | ORAL | Status: DC | PRN

## 2011-12-22 NOTE — Discharge Summary (Signed)
Physician Discharge Summary  [name] ZOX:[id number] DOB: 02/09/1932 DOA: 12/21/2011  PCP: Lillia Mountain, MD  Admit date: 12/21/2011 Discharge date: 12/22/2011  Recommendations for Outpatient Follow-up:  1. Patient be discharged home. She will followup with her PCP in the next few weeks.  Discharge Diagnoses:  Active Problems:  Indigestion  Renal artery stenosis  DM (diabetes mellitus)  CAD (coronary artery disease)  HTN (hypertension)  Hyperlipidemia  GERD (gastroesophageal reflux disease)   Discharge Condition: Improved, being discharged home  Diet recommendation: Carb modified heart healthy  Filed Weights   12/21/11 2100  Weight: 58.06 kg (128 lb)    History of present illness:  [name] is a 78 y.o. female, with above-mentioned past medical history, history of carotid disease status post cardiac cath in April 2008 with acute MI showing ejection fraction of 60% with moderate inferior hypokinesis and 60% mid LAD stenosis, history of diabetes mellitus diet-controlled, hypertension, dyslipidemia, and renal artery stenosis status post renal angiogram with left renal artery stent, presents with complaints of not feeling well, and indigestion, patient reports this afternoon she didn't feel well, had an episode of indigestion, where she checked her blood pressure where it was 220/110, was complaining of some mild headache then, and eyes any chest pain, but reports air this episode of indigestion resembles care presentation when she had her last heart attack, denies any shortness of breath palpitation, diaphoresis, nausea, patient EKG did not show any significant changes from previous, where she had inferior lead Q waves which are old, and her first troponin were negative, patient given 325 of aspirin, and air currently reports she's feeling much better.  Hospital Course:  Malignant hypertension: Patient's blood pressures are better controlled with IV hydralazine. Hospital day 2  her blood pressures are better with a systolic in the 140s to 150s. We'll give her a prescription for when necessary clonidine for systolic greater than 160. Also advised her PCP of this and he will follow up with this as well.  Indigestion: Suspected this was more GI in nature. Nevertheless given previous extensive CAD history, patient was observed overnight and enzymes x3 were cycled. These were unremarkable.  Diabetes mellitus: Stable medically treated with hospitalization.  Procedures:  None  Consultations:  None  Discharge Exam: Filed Vitals:   12/21/11 1924 12/21/11 2100 12/22/11 0500 12/22/11 1039  BP: 145/58 131/60 151/58 149/62  Pulse: 66 65 66 63  Temp: 98.9 F (37.2 C) 98.1 F (36.7 C) 98 F (36.7 C)   TempSrc:  Oral Oral   Resp: 18 18 18    Height:  5\' 2"  (1.575 m)    Weight:  58.06 kg (128 lb)    SpO2: 96% 96% 96%     General: Alert and oriented x3, no acute distress, looks about stated age HEENT: Normocephalic and atraumatic, mucous members are moist Cardiovascular:Regular rate and rhythm, S1-S2  Respiratory: Auscultation bilaterally Abdomen: Soft, nontender, nondistended, positive bowel sounds Extremities: No clubbing or cyanosis or edema  Discharge Instructions  Discharge Orders    Future Orders Please Complete By Expires   Diet - low sodium heart healthy      Increase activity slowly        Medication List  As of 12/22/2011  3:06 PM   TAKE these medications         aspirin 325 MG tablet   Take 325 mg by mouth at bedtime.      carvedilol 25 MG tablet   Commonly known as: COREG   Take  25 mg by mouth 2 (two) times daily with a meal.      cloNIDine 0.1 MG tablet   Commonly known as: CATAPRES   Take 1 tablet (0.1 mg total) by mouth 2 (two) times daily as needed (For systolic BP >160).      esomeprazole 40 MG capsule   Commonly known as: NEXIUM   Take 40 mg by mouth 2 (two) times daily.      meclizine 25 MG tablet   Commonly known as: ANTIVERT    Take 25 mg by mouth 3 (three) times daily as needed. For dizziness      olmesartan 20 MG tablet   Commonly known as: BENICAR   Take 20 mg by mouth 2 (two) times daily.      PREMARIN vaginal cream   Generic drug: conjugated estrogens   Take 1 tablet by mouth BID times 48H.      simvastatin 20 MG tablet   Commonly known as: ZOCOR   Take 1 tablet by mouth daily.      triamterene-hydrochlorothiazide 37.5-25 MG per tablet   Commonly known as: MAXZIDE-25   Take 0.5 tablets by mouth daily.           Follow-up Information    Follow up with Lillia Mountain, MD. Schedule an appointment as soon as possible for a visit in 1 month.   Contact information:   [address], [address] [employer] [place], [place]. [place] [address] [phone]       Follow up with Lesleigh Noe, MD in 1 month. (As needed)    Contact information:   [address] [place] [address] [place] [address] [phone]           The results of significant diagnostics from this hospitalization (including imaging, microbiology, ancillary and laboratory) are listed below for reference.    Significant Diagnostic Studies: Dg Chest Portable 1 View  12/21/2011    IMPRESSION: No acute abnormality.   Original Report Authenticated By: Darrol Angel, M.D.        Labs: Basic Metabolic Panel:  Lab 12/22/11 6962 12/21/11 1719  NA 141 138  K 4.1 4.4  CL 105 107  CO2 27 --  GLUCOSE 120* 120*  BUN 31* 33*  CREATININE 1.23* 1.30*  CALCIUM 9.6 --  MG -- --  PHOS -- --   CBC:  Lab 12/22/11 0615 12/21/11 1719  WBC 6.0 --  NEUTROABS -- --  HGB 11.8* 12.9  HCT 35.1* 38.0  MCV 86.2 --  PLT 174 --   Cardiac Enzymes:  Lab 12/22/11 0615 12/22/11 0020  CKTOTAL -- --  CKMB -- --  CKMBINDEX -- --  TROPONINI <0.30 <0.30   CBG:  Lab 12/22/11 1156 12/22/11 0755 12/21/11 2116  GLUCAP 118* 137* 204*    Time coordinating discharge: 25  minutes  Signed:  Hollice Espy  [facility] 12/22/2011, 3:06 PM

## 2011-12-23 LAB — URINE CULTURE: Colony Count: >=100000

## 2011-12-24 ENCOUNTER — Other Ambulatory Visit: Payer: Self-pay | Admitting: Internal Medicine

## 2011-12-24 DIAGNOSIS — R928 Other abnormal and inconclusive findings on diagnostic imaging of breast: Secondary | ICD-10-CM

## 2012-01-09 ENCOUNTER — Other Ambulatory Visit: Payer: Self-pay | Admitting: Internal Medicine

## 2012-01-09 ENCOUNTER — Ambulatory Visit
Admission: RE | Admit: 2012-01-09 | Discharge: 2012-01-09 | Disposition: A | Discharge status: Home / Self Care | Payer: Medicare Other | Source: Ambulatory Visit | Attending: Internal Medicine | Admitting: Internal Medicine

## 2012-01-09 ENCOUNTER — Ambulatory Visit
Admission: RE | Admit: 2012-01-09 | Discharge: 2012-01-09 | Disposition: A | Discharge status: Home / Self Care | Payer: No Typology Code available for payment source | Source: Ambulatory Visit | Attending: Internal Medicine | Admitting: Internal Medicine

## 2012-01-09 DIAGNOSIS — R928 Other abnormal and inconclusive findings on diagnostic imaging of breast: Secondary | ICD-10-CM

## 2012-05-20 ENCOUNTER — Other Ambulatory Visit: Payer: Self-pay | Admitting: Internal Medicine

## 2012-05-20 DIAGNOSIS — N63 Unspecified lump in unspecified breast: Secondary | ICD-10-CM

## 2012-07-06 ENCOUNTER — Ambulatory Visit
Admission: RE | Admit: 2012-07-06 | Discharge: 2012-07-06 | Disposition: A | Discharge status: Home / Self Care | Payer: Medicare Other | Source: Ambulatory Visit | Attending: Internal Medicine | Admitting: Internal Medicine

## 2012-07-06 DIAGNOSIS — N63 Unspecified lump in unspecified breast: Secondary | ICD-10-CM

## 2012-07-20 ENCOUNTER — Ambulatory Visit
Admission: RE | Admit: 2012-07-20 | Discharge: 2012-07-20 | Disposition: A | Discharge status: Home / Self Care | Payer: Medicare Other | Source: Ambulatory Visit | Attending: Internal Medicine | Admitting: Internal Medicine

## 2012-07-20 ENCOUNTER — Other Ambulatory Visit: Payer: Self-pay | Admitting: Internal Medicine

## 2012-07-20 ENCOUNTER — Ambulatory Visit: Admission: RE | Admit: 2012-07-20 | Payer: Medicare Other | Source: Ambulatory Visit

## 2012-07-20 DIAGNOSIS — N63 Unspecified lump in unspecified breast: Secondary | ICD-10-CM

## 2012-07-21 ENCOUNTER — Telehealth (INDEPENDENT_AMBULATORY_CARE_PROVIDER_SITE_OTHER): Payer: Self-pay

## 2012-07-21 NOTE — Telephone Encounter (Signed)
Dr [facility]enthal called from [facility] to ask if Dr Luisa Hart would mind seeing this patient for surgery consultation of her right breast. He says patient has had multiple core biopsies all showing fat necrosis. This area on right breast is getting bigger and is worrisome with her family history of breast cancer.  He is afraid of just following up with an MRI. Dr Jean Rosenthal wants [facility] to call him if he has any questions @ [phone].

## 2012-08-16 ENCOUNTER — Encounter (INDEPENDENT_AMBULATORY_CARE_PROVIDER_SITE_OTHER): Payer: Self-pay | Admitting: Surgery

## 2012-08-16 ENCOUNTER — Ambulatory Visit (INDEPENDENT_AMBULATORY_CARE_PROVIDER_SITE_OTHER): Payer: Medicare Other | Admitting: Surgery

## 2012-08-16 VITALS — BP 160/70 | HR 76 | Temp 97.2°F | Ht 62.25 in | Wt 125.6 lb

## 2012-08-16 DIAGNOSIS — N63 Unspecified lump in unspecified breast: Secondary | ICD-10-CM

## 2012-08-16 NOTE — Patient Instructions (Signed)
Refer for needle biopsy of breast

## 2012-08-16 NOTE — Progress Notes (Signed)
Subjective:     Patient ID: [name], female   DOB: [birth year], 79 y.o.   MRN: [id number]  HPI  patient presents at the request of Dr. Jean Rosenthal do to 2 areas one in each breast and correspond to fat necrosis. There is been some interval change in the size of these areas. MRI is recommended to evaluate the patient refused. She is here today to discuss options about her bilateral breast masses and options available for workup. Denies any breast pain, nipple discharge or changes in her breast otherwise. These areas have been followed by radiology. She has had bilateral breast reduction and multiple lumpectomies in the past all showing fat necrosis. Most recent was in 2009.  Review of Systems  Constitutional: Negative.   HENT: Negative.   Cardiovascular: Negative.   Genitourinary: Negative.   Neurological: Negative.   Psychiatric/Behavioral: Negative.        Objective:   Physical Exam  Constitutional: She is oriented to person, place, and time. She appears well-developed and well-nourished.  HENT:  Head: Normocephalic and atraumatic.  Pulmonary/Chest: Right breast exhibits no inverted nipple, no mass, no nipple discharge, no skin change and no tenderness. Left breast exhibits skin change. Left breast exhibits no inverted nipple, no mass, no nipple discharge and no tenderness. Breasts are symmetrical.    Musculoskeletal: Normal range of motion.  Neurological: She is alert and oriented to person, place, and time.  Skin: Skin is warm and dry.  Psychiatric: She has a normal mood and affect. Her behavior is normal. Thought content normal.   Clinical Data: 14-month reevaluation of probable areas of evolving  fat necrosis. The patient does have a history of multiple previous  excisional and ultrasound-guided core biopsies bilaterally. All of  these have demonstrated fat necrosis. She also has a remote  history of bilateral breast reduction surgery. There is a strong  family history of  breast cancer in two sisters at age 49 and 4.  DIGITAL DIAGNOSTIC BILATERAL MAMMOGRAM WITH CAD AND BILATERAL  BREAST ULTRASOUND:  Comparison: 01/12/2012, 01/09/2012, 12/18/2011, 12/09/2010,  11/21/2009, 10/20/2008, 10/20/2007, 10/05/2006, 10/02/2005.  Findings:  ACR Breast Density Category 2: There is a scattered fibroglandular  pattern.  The irregular mass previously biopsied within the right breast at  12 o'clock position (which demonstrated fat necrosis) has increased  in size and there are central calcifications present associated  with this mass. There is a ribbon shaped clip associated with the  central portion of the mass. The mass now measures 2.1 cm in  diameter by mammography. In addition there is a 9 mm irregular  mass now seen located within the upper inner quadrant of the right  breast which has developed when compared to prior study. This is  in the region of the needle tract (medial approach) related to the  right breast ultrasound guided core biopsy. There is an irregular  mass with dense (coarse) calcifications located within the upper-  outer quadrant of the right breast which has a similar appearance  when compared to the prior study.  The mass previously described within the left breast at the 1  o'clock position has mildly increased in size and now measures 1.5  cm in diameter by mammography.  Mammographic images were processed with CAD.  On physical exam, there is no discrete palpable abnormality within  the superior right breast or upper-outer quadrant left breast.  Ultrasound is performed, showing an irregular hypoechoic mass with  a central clip located within the right breast at  the 12 o'clock  position 5 cm from nipple corresponding to the mass biopsied on  01/09/2012 (demonstrating fat necrosis). This now measures 1.3 x  1.3 x 1.0 cm in size and previously this measured 1.0 x 0.9 x 0.9  cm in size. In addition, there is a new irregular hypoechoic mass    with echogenic periphery located within the right breast at the  01:30 o'clock position 3 cm from nipple measuring 10 x 8 x 9 mm in  size.  The irregular hypoechoic mass previously seen located within the  left breast at the one to two o'clock position 4 cm from nipple now  measures 10 x 9 x 6 mm in size and also has mildly increased in  size.  Given the patient's history, this most likely this represents  worsening areas of fat necrosis. However, malignancy cannot be  excluded and I recommend bilateral breast MRI at this time given  the complexity of the mammographic/ultrasound findings and the  patients strong family history of breast cancer.  IMPRESSION:  1. Increase in size of the mass located within the right breast at  12 o'clock position and within the left breast at the one to two  o'clock position as well as development of new mass within the  upper inner quadrant of the right breast. These may represent  areas of evolving fat necrosis versus malignancy. Recommend  bilateral breast MRI at this time.     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 Core needle biopsy is reasonable to do to exclude malignancy the areas that are enlarging. Will refer back to Dr. Jean Rosenthal consideration of core biopsy in the setting

## 2012-08-20 ENCOUNTER — Telehealth (INDEPENDENT_AMBULATORY_CARE_PROVIDER_SITE_OTHER): Payer: Self-pay | Admitting: General Surgery

## 2012-08-20 ENCOUNTER — Emergency Department (HOSPITAL_COMMUNITY): Payer: Medicare Other

## 2012-08-20 ENCOUNTER — Encounter (HOSPITAL_COMMUNITY): Payer: Self-pay

## 2012-08-20 ENCOUNTER — Observation Stay (HOSPITAL_COMMUNITY)
Admission: EM | Admit: 2012-08-20 | Discharge: 2012-08-23 | Disposition: A | Discharge status: Home / Self Care | Payer: Medicare Other | Attending: Internal Medicine | Admitting: Internal Medicine

## 2012-08-20 DIAGNOSIS — R109 Unspecified abdominal pain: Secondary | ICD-10-CM | POA: Insufficient documentation

## 2012-08-20 DIAGNOSIS — R1032 Left lower quadrant pain: Principal | ICD-10-CM | POA: Insufficient documentation

## 2012-08-20 DIAGNOSIS — I2510 Atherosclerotic heart disease of native coronary artery without angina pectoris: Secondary | ICD-10-CM | POA: Insufficient documentation

## 2012-08-20 DIAGNOSIS — E119 Type 2 diabetes mellitus without complications: Secondary | ICD-10-CM | POA: Insufficient documentation

## 2012-08-20 DIAGNOSIS — K219 Gastro-esophageal reflux disease without esophagitis: Secondary | ICD-10-CM | POA: Insufficient documentation

## 2012-08-20 DIAGNOSIS — E785 Hyperlipidemia, unspecified: Secondary | ICD-10-CM | POA: Insufficient documentation

## 2012-08-20 DIAGNOSIS — D72829 Elevated white blood cell count, unspecified: Secondary | ICD-10-CM | POA: Insufficient documentation

## 2012-08-20 DIAGNOSIS — I10 Essential (primary) hypertension: Secondary | ICD-10-CM

## 2012-08-20 DIAGNOSIS — M810 Age-related osteoporosis without current pathological fracture: Secondary | ICD-10-CM | POA: Insufficient documentation

## 2012-08-20 DIAGNOSIS — I701 Atherosclerosis of renal artery: Secondary | ICD-10-CM | POA: Insufficient documentation

## 2012-08-20 LAB — CBC WITH DIFFERENTIAL/PLATELET
Eosinophils Relative: 1 % (ref 0–5)
HCT: 38.8 % (ref 36.0–46.0)
Lymphocytes Relative: 13 % (ref 12–46)
Lymphs Abs: 1.7 10*3/uL (ref 0.7–4.0)
MCV: 85.7 fL (ref 78.0–100.0)
Monocytes Absolute: 0.6 10*3/uL (ref 0.1–1.0)
RBC: 4.53 MIL/uL (ref 3.87–5.11)
WBC: 12.5 10*3/uL — ABNORMAL HIGH (ref 4.0–10.5)

## 2012-08-20 LAB — URINALYSIS, ROUTINE W REFLEX MICROSCOPIC
Bilirubin Urine: NEGATIVE
Nitrite: NEGATIVE
Protein, ur: 30 mg/dL — AB
Specific Gravity, Urine: 1.012 (ref 1.005–1.030)
Urobilinogen, UA: 0.2 mg/dL (ref 0.0–1.0)

## 2012-08-20 LAB — COMPREHENSIVE METABOLIC PANEL
ALT: 23 U/L (ref 0–35)
CO2: 28 mEq/L (ref 19–32)
Calcium: 10 mg/dL (ref 8.4–10.5)
Chloride: 99 mEq/L (ref 96–112)
Creatinine, Ser: 1.15 mg/dL — ABNORMAL HIGH (ref 0.50–1.10)
GFR calc Af Amer: 51 mL/min — ABNORMAL LOW (ref >90)
GFR calc non Af Amer: 44 mL/min — ABNORMAL LOW (ref >90)
Glucose, Bld: 125 mg/dL — ABNORMAL HIGH (ref 70–99)
Sodium: 138 mEq/L (ref 135–145)
Total Bilirubin: 0.4 mg/dL (ref 0.3–1.2)

## 2012-08-20 LAB — URINE MICROSCOPIC-ADD ON

## 2012-08-20 MED ORDER — SODIUM CHLORIDE 0.9 % IV SOLN
3.0000 g | Freq: Once | INTRAVENOUS | Status: AC
  Administered 2012-08-21: 3 g via INTRAVENOUS
  Filled 2012-08-20: qty 3

## 2012-08-20 MED ORDER — MEPERIDINE HCL 25 MG/ML IJ SOLN
12.5000 mg | Freq: Once | INTRAMUSCULAR | Status: AC
  Administered 2012-08-20: 12.5 mg via INTRAVENOUS
  Filled 2012-08-20: qty 1

## 2012-08-20 MED ORDER — METRONIDAZOLE IN NACL 5-0.79 MG/ML-% IV SOLN: 500.0000 mg | Freq: Once | INTRAVENOUS | Status: DC

## 2012-08-20 MED ORDER — MEPERIDINE HCL 25 MG/ML IJ SOLN
25.0000 mg | Freq: Once | INTRAMUSCULAR | Status: AC
  Administered 2012-08-21: 25 mg via INTRAVENOUS
  Filled 2012-08-20: qty 1

## 2012-08-20 MED ORDER — IOHEXOL 300 MG/ML  SOLN
25.0000 mL | INTRAMUSCULAR | Status: AC
  Administered 2012-08-20: 25 mL via ORAL

## 2012-08-20 MED ORDER — PROMETHAZINE HCL 25 MG/ML IJ SOLN
25.0000 mg | Freq: Once | INTRAMUSCULAR | Status: AC
  Administered 2012-08-20: 25 mg via INTRAVENOUS
  Filled 2012-08-20: qty 1

## 2012-08-20 NOTE — ED Provider Notes (Addendum)
History     CSN: [id number]  Arrival date & time 08/20/12  1655   First MD Initiated Contact with Patient 08/20/12 1823      Chief Complaint  Patient presents with  . Abdominal Pain    (Consider location/radiation/quality/duration/timing/severity/associated sxs/prior treatment) HPI Comments: Pt with hx of CAD, RAS, HTN, HL comes in with cc of abd pain. Pt states that she started having abd pain about 2 days ago, but it has gotten worse overtime. The pain is primarily in the LLQ, it is constant, sharp and is worse with her laying down. She has mild nausea, no emesis, no bloody stools, diarrhea, last BM was today. She has no uti like sx and there is hx of renal stones. PT has had several abd surgeries.  The history is provided by the patient.    Past Medical History  Diagnosis Date  . CAD (coronary artery disease)   . Renal artery stenosis   . HTN (hypertension)   . Dyslipidemia   . DM (diabetes mellitus)   . GERD (gastroesophageal reflux disease)   . Osteoporosis     Past Surgical History  Procedure Laterality Date  . Right hip fx 2011    . Left wrist fx 2012    . Abdominal hysterectomy    . Cholecystectomy    . Bladder tac      x3  . Cardiac catheterization    . Neck sx    . Breast surgery      breast reduction 1985  . Left renal stent      History reviewed. No pertinent family history.  History  Substance Use Topics  . Smoking status: Former Games developer  . Smokeless tobacco: Not on file  . Alcohol Use: No    OB History   Grav Para Term Preterm Abortions TAB SAB Ect Mult Living                  Review of Systems  Constitutional: Negative for activity change.  HENT: Negative for facial swelling and neck pain.   Respiratory: Negative for cough, shortness of breath and wheezing.   Cardiovascular: Negative for chest pain.  Gastrointestinal: Positive for nausea and abdominal pain. Negative for vomiting, diarrhea, constipation, blood in stool and abdominal  distention.  Genitourinary: Negative for hematuria and difficulty urinating.  Skin: Negative for color change.  Neurological: Negative for syncope, speech difficulty and weakness.  Hematological: Does not bruise/bleed easily.  Psychiatric/Behavioral: Negative for confusion.    Allergies  Morphine and related; Actamin; Actonel; Amlodipine; Avapro; Cardio complete; Cardizem; Ciprofloxacin; Codeine; Dilaudid; Fentanyl; Forteo; Fosamax; Inapsine; Ivp dye; Lipitor; Lisinopril; Micardis; Septra; Shellfish allergy; Tekturna; Tricor; and Tussionex pennkinetic er  Home Medications   Current Outpatient Rx  Name  Route  Sig  Dispense  Refill  . aspirin 325 MG tablet   Oral   Take 325 mg by mouth at bedtime.         . betamethasone dipropionate (DIPROLENE) 0.05 % cream   Topical   Apply 1 application topically daily as needed (irritated skin).         . carvedilol (COREG) 25 MG tablet   Oral   Take 25 mg by mouth 2 (two) times daily with a meal.         . cholecalciferol (VITAMIN D) 1000 UNITS tablet   Oral   Take 1,000 Units by mouth daily.         . cloNIDine (CATAPRES) 0.1 MG tablet   Oral  Take 0.1 mg by mouth daily as needed (For systolic BP >210).         [name] Cod Liver Oil CAPS   Oral   Take 1 capsule by mouth daily.         [name] esomeprazole (NEXIUM) 40 MG capsule   Oral   Take 40 mg by mouth 2 (two) times daily.         . fexofenadine (ALLEGRA) 180 MG tablet   Oral   Take 180 mg by mouth daily.         . meclizine (ANTIVERT) 25 MG tablet   Oral   Take 25 mg by mouth 3 (three) times daily as needed for dizziness.          . Multiple Minerals-Vitamins (CITRACAL PLUS) TABS   Oral   Take 3 tablets by mouth 2 (two) times daily.         . Multiple Vitamins-Minerals (PRESERVISION AREDS) CAPS   Oral   Take 1 capsule by mouth daily.         . nitroGLYCERIN (NITROSTAT) 0.4 MG SL tablet   Sublingual   Place 0.4 mg under the tongue every 5 (five) minutes  as needed for chest pain.         [name] olmesartan (BENICAR) 20 MG tablet   Oral   Take 10 mg by mouth 2 (two) times daily.          . simvastatin (ZOCOR) 20 MG tablet   Oral   Take 20 mg by mouth daily.          [name] triamterene-hydrochlorothiazide (MAXZIDE-25) 37.5-25 MG per tablet   Oral   Take 0.5 tablets by mouth daily.         [name] VITAMIN E PO   Oral   Take 1 capsule by mouth daily.           BP 155/49  Pulse 66  Temp(Src) 99.2 F (37.3 C) (Oral)  Resp 18  Ht 5\' 2"  (1.575 m)  Wt 126 lb (57.153 kg)  BMI 23.04 kg/m2  SpO2 95%  Physical Exam  Nursing note and vitals reviewed. Constitutional: She is oriented to person, place, and time. She appears well-developed and well-nourished.  HENT:  Head: Normocephalic and atraumatic.  Eyes: EOM are normal. Pupils are equal, round, and reactive to light.  Neck: Neck supple.  Cardiovascular: Normal rate, regular rhythm, normal heart sounds and intact distal pulses.   No murmur heard. Pulmonary/Chest: Effort normal. No respiratory distress.  Abdominal: Soft. She exhibits no distension and no mass. There is tenderness. There is guarding. There is no rebound.  LLQ tenderness with guarding  Neurological: She is alert and oriented to person, place, and time.  Skin: Skin is warm and dry.    ED Course  Procedures (including critical care time)  Labs Reviewed  CBC WITH DIFFERENTIAL - Abnormal; Notable for the following:    WBC 12.5 (*)    Neutrophils Relative 81 (*)    Neutro Abs 10.1 (*)    All other components within normal limits  COMPREHENSIVE METABOLIC PANEL - Abnormal; Notable for the following:    Glucose, Bld 125 (*)    BUN 24 (*)    Creatinine, Ser 1.15 (*)    GFR calc non Af Amer 44 (*)    GFR calc Af Amer 51 (*)    All other components within normal limits  URINALYSIS, ROUTINE W REFLEX MICROSCOPIC - Abnormal; Notable for the following:  APPearance CLOUDY (*)    Hgb urine dipstick TRACE (*)    Ketones, ur  15 (*)    Protein, ur 30 (*)    All other components within normal limits  URINE MICROSCOPIC-ADD ON - Abnormal; Notable for the following:    Squamous Epithelial / LPF FEW (*)    Bacteria, UA MANY (*)    All other components within normal limits   No results found.   No diagnosis found.    MDM  DDx includes: Diverticulitis Aortic Dissection Colitis AAA Tumors Colitis Intra abdominal abscess Thrombosis Mesenteric ischemia Nephrolithiasis Pyelonephritis UTI/Cystitis Ovarian cyst TOA  Pt comes in with cc of abd pain. PT has LLQ abd pain. No hx of diverticulitis. Suspicion is high for diverticulitis. We will get CT with oral contrast (Cr is slightly elevated) to evaluate the pain.    Derwood Kaplan, MD 08/20/12 2301  11:54 PM Pt's CT is negative for diverticulitis, or any acute process. AAA ruled out. No SBO. She however, has persistent pain, and her being diabetic and having CAD, ischemic bowel is possible. No bloody stools. Hb is stable. We will admit for persistent abd pain.   Derwood Kaplan, MD 08/20/12 2355

## 2012-08-20 NOTE — Telephone Encounter (Signed)
Spoke with patient about US breast BX  Dr Luisa Hart and Dr Jean Rosenthal both agree that a MRI Breast is a better way of testing for her and the issue she is having . This patient states she wishing to not do MRI so therefore she is  choosing  to do nothing .

## 2012-08-20 NOTE — ED Notes (Signed)
CT contrast here for patient to start drinking.

## 2012-08-20 NOTE — ED Notes (Signed)
Patient needs to drink her contrast over 2 hours

## 2012-08-20 NOTE — ED Notes (Signed)
Pt c/o LLQ pain and nausea x4 days, pain decreases when bending over. Pt denies chest/back pain, SOB, V/D, bloody stool, or fever. Pt sent here by her PCP for further evaluation

## 2012-08-21 DIAGNOSIS — I2510 Atherosclerotic heart disease of native coronary artery without angina pectoris: Secondary | ICD-10-CM

## 2012-08-21 DIAGNOSIS — R109 Unspecified abdominal pain: Secondary | ICD-10-CM

## 2012-08-21 DIAGNOSIS — I10 Essential (primary) hypertension: Secondary | ICD-10-CM

## 2012-08-21 DIAGNOSIS — R1032 Left lower quadrant pain: Secondary | ICD-10-CM

## 2012-08-21 DIAGNOSIS — E119 Type 2 diabetes mellitus without complications: Secondary | ICD-10-CM

## 2012-08-21 LAB — BASIC METABOLIC PANEL
BUN: 18 mg/dL (ref 6–23)
Chloride: 104 mEq/L (ref 96–112)
Creatinine, Ser: 1.02 mg/dL (ref 0.50–1.10)
GFR calc Af Amer: 59 mL/min — ABNORMAL LOW (ref >90)
GFR calc non Af Amer: 51 mL/min — ABNORMAL LOW (ref >90)
Glucose, Bld: 103 mg/dL — ABNORMAL HIGH (ref 70–99)

## 2012-08-21 LAB — CBC
HCT: 32.9 % — ABNORMAL LOW (ref 36.0–46.0)
MCH: 29.6 pg (ref 26.0–34.0)
MCHC: 34.7 g/dL (ref 30.0–36.0)
RDW: 13.2 % (ref 11.5–15.5)

## 2012-08-21 LAB — GLUCOSE, CAPILLARY
Glucose-Capillary: 140 mg/dL — ABNORMAL HIGH (ref 70–99)
Glucose-Capillary: 116 mg/dL — ABNORMAL HIGH (ref 70–99)

## 2012-08-21 MED ORDER — ACETAMINOPHEN 325 MG PO TABS
650.0000 mg | ORAL_TABLET | Freq: Four times a day (QID) | ORAL | Status: DC | PRN
  Administered 2012-08-23: 650 mg via ORAL
  Filled 2012-08-21: qty 2

## 2012-08-21 MED ORDER — ONDANSETRON HCL 4 MG PO TABS: 4.0000 mg | ORAL_TABLET | Freq: Four times a day (QID) | ORAL | Status: DC | PRN

## 2012-08-21 MED ORDER — ZOLPIDEM TARTRATE 5 MG PO TABS: 5.0000 mg | ORAL_TABLET | Freq: Every evening | ORAL | Status: DC | PRN

## 2012-08-21 MED ORDER — SODIUM CHLORIDE 0.9 % IV SOLN
INTRAVENOUS | Status: AC
  Administered 2012-08-21 (×2): via INTRAVENOUS

## 2012-08-21 MED ORDER — PANTOPRAZOLE SODIUM 40 MG PO TBEC
40.0000 mg | DELAYED_RELEASE_TABLET | Freq: Every day | ORAL | Status: DC
  Administered 2012-08-21: 40 mg via ORAL
  Filled 2012-08-21 (×2): qty 1

## 2012-08-21 MED ORDER — VITAMIN E 180 MG (400 UNIT) PO CAPS
400.0000 [IU] | ORAL_CAPSULE | Freq: Every day | ORAL | Status: DC
  Filled 2012-08-21: qty 1

## 2012-08-21 MED ORDER — INSULIN ASPART 100 UNIT/ML ~~LOC~~ SOLN: 0.0000 [IU] | Freq: Three times a day (TID) | SUBCUTANEOUS | Status: DC

## 2012-08-21 MED ORDER — CARVEDILOL 25 MG PO TABS
25.0000 mg | ORAL_TABLET | Freq: Two times a day (BID) | ORAL | Status: DC
  Administered 2012-08-21 – 2012-08-23 (×5): 25 mg via ORAL
  Filled 2012-08-21 (×7): qty 1

## 2012-08-21 MED ORDER — SIMVASTATIN 20 MG PO TABS
20.0000 mg | ORAL_TABLET | Freq: Every day | ORAL | Status: DC
  Administered 2012-08-21 – 2012-08-22 (×2): 20 mg via ORAL
  Filled 2012-08-21 (×3): qty 1

## 2012-08-21 MED ORDER — CLONIDINE HCL 0.1 MG PO TABS: 0.1000 mg | ORAL_TABLET | Freq: Two times a day (BID) | ORAL | Status: DC | PRN

## 2012-08-21 MED ORDER — VITAMIN D3 25 MCG (1000 UNIT) PO TABS
1000.0000 [IU] | ORAL_TABLET | Freq: Every day | ORAL | Status: DC
  Administered 2012-08-21 – 2012-08-22 (×2): 1000 [IU] via ORAL
  Filled 2012-08-21 (×3): qty 1

## 2012-08-21 MED ORDER — ALUM & MAG HYDROXIDE-SIMETH 200-200-20 MG/5ML PO SUSP: 30.0000 mL | Freq: Four times a day (QID) | ORAL | Status: DC | PRN

## 2012-08-21 MED ORDER — ASPIRIN 325 MG PO TABS
325.0000 mg | ORAL_TABLET | Freq: Every day | ORAL | Status: DC
  Administered 2012-08-21 – 2012-08-22 (×3): 325 mg via ORAL
  Filled 2012-08-21 (×4): qty 1

## 2012-08-21 MED ORDER — SODIUM CHLORIDE 0.9 % IV SOLN
INTRAVENOUS | Status: DC
  Administered 2012-08-21 – 2012-08-22 (×2): via INTRAVENOUS

## 2012-08-21 MED ORDER — MEPERIDINE HCL 25 MG/ML IJ SOLN
25.0000 mg | INTRAMUSCULAR | Status: DC | PRN
  Administered 2012-08-21 – 2012-08-23 (×9): 25 mg via INTRAVENOUS
  Filled 2012-08-21 (×9): qty 1

## 2012-08-21 MED ORDER — ONDANSETRON HCL 4 MG/2ML IJ SOLN
4.0000 mg | Freq: Four times a day (QID) | INTRAMUSCULAR | Status: DC | PRN
  Administered 2012-08-22 (×3): 4 mg via INTRAVENOUS
  Filled 2012-08-21 (×3): qty 2

## 2012-08-21 MED ORDER — ACETAMINOPHEN 650 MG RE SUPP: 650.0000 mg | Freq: Four times a day (QID) | RECTAL | Status: DC | PRN

## 2012-08-21 MED ORDER — TRIAMTERENE-HCTZ 37.5-25 MG PO TABS
0.5000 | ORAL_TABLET | Freq: Every day | ORAL | Status: DC
  Administered 2012-08-21 – 2012-08-22 (×2): 0.5 via ORAL
  Filled 2012-08-21 (×3): qty 0.5

## 2012-08-21 MED ORDER — PROMETHAZINE HCL 25 MG/ML IJ SOLN
12.5000 mg | Freq: Four times a day (QID) | INTRAMUSCULAR | Status: DC | PRN
  Administered 2012-08-21: 12.5 mg via INTRAVENOUS
  Filled 2012-08-21: qty 1

## 2012-08-21 MED ORDER — FLUCONAZOLE 150 MG PO TABS
150.0000 mg | ORAL_TABLET | Freq: Every day | ORAL | Status: AC
  Administered 2012-08-21 – 2012-08-22 (×2): 150 mg via ORAL
  Filled 2012-08-21 (×2): qty 1

## 2012-08-21 NOTE — H&P (Signed)
[facility] History and Physical  [name] ZOX:[id number] DOB: 12/05/1931 DOA: 08/20/2012  Referring physician: EDP PCP: Lillia Mountain, MD  Specialists:   Chief Complaint: LLQ ABD Pain X 4 days  HPI: [name] is a 79 y.o. female who presents to the ED with complaints of severe LLq ABD Pain X 4 days.   She denies having any diarrhea or constipation or melena or hematochezia.  She does report having nausea but no vomiting.    She also reports having low grade fevers.    She was sseen by her PCP today and was referred to the ED .  In the ED she had a ct scan performed which was negative for acute findings.      Review of Systems: The patient denies anorexia, fever, weight loss, vision loss, decreased hearing, hoarseness, chest pain, syncope, dyspnea on exertion, peripheral edema, balance deficits, hemoptysis,nausea, vomiting, diarrhea, constipation, hematemesis, melena, hematochezia, severe indigestion/heartburn, hematuria, incontinence,dysuria, muscle weakness, suspicious skin lesions, transient blindness, difficulty walking, depression, unusual weight change, abnormal bleeding, enlarged lymph nodes, angioedema, and breast masses.    Past Medical History  Diagnosis Date  . CAD (coronary artery disease)   . Renal artery stenosis   . HTN (hypertension)   . Dyslipidemia   . DM (diabetes mellitus)   . GERD (gastroesophageal reflux disease)   . Osteoporosis    Past Surgical History  Procedure Laterality Date  . Right hip fx 2011    . Left wrist fx 2012    . Abdominal hysterectomy    . Cholecystectomy    . Bladder tac      x3  . Cardiac catheterization    . Neck sx    . Breast surgery      breast reduction 1985  . Left renal stent      Medications:  HOME MEDS: Prior to Admission medications   Medication Sig Start Date End Date Taking? Authorizing Provider  aspirin 325 MG tablet Take 325 mg by mouth at bedtime.   Yes Historical Provider, MD  betamethasone  dipropionate (DIPROLENE) 0.05 % cream Apply 1 application topically daily as needed (irritated skin).   Yes Historical Provider, MD  carvedilol (COREG) 25 MG tablet Take 25 mg by mouth 2 (two) times daily with a meal.   Yes Historical Provider, MD  cholecalciferol (VITAMIN D) 1000 UNITS tablet Take 1,000 Units by mouth daily.   Yes Historical Provider, MD  cloNIDine (CATAPRES) 0.1 MG tablet Take 0.1 mg by mouth daily as needed (For systolic BP >210). 12/22/11 12/21/12 Yes Hollice Espy, MD  Cod Liver Oil CAPS Take 1 capsule by mouth daily.   Yes Historical Provider, MD  esomeprazole (NEXIUM) 40 MG capsule Take 40 mg by mouth 2 (two) times daily.   Yes Historical Provider, MD  fexofenadine (ALLEGRA) 180 MG tablet Take 180 mg by mouth daily.   Yes Historical Provider, MD  meclizine (ANTIVERT) 25 MG tablet Take 25 mg by mouth 3 (three) times daily as needed for dizziness.    Yes Historical Provider, MD  Multiple Minerals-Vitamins (CITRACAL PLUS) TABS Take 3 tablets by mouth 2 (two) times daily.   Yes Historical Provider, MD  Multiple Vitamins-Minerals (PRESERVISION AREDS) CAPS Take 1 capsule by mouth daily.   Yes Historical Provider, MD  nitroGLYCERIN (NITROSTAT) 0.4 MG SL tablet Place 0.4 mg under the tongue every 5 (five) minutes as needed for chest pain.   Yes Historical Provider, MD  olmesartan (BENICAR) 20 MG tablet Take 10  mg by mouth 2 (two) times daily.    Yes Historical Provider, MD  simvastatin (ZOCOR) 20 MG tablet Take 20 mg by mouth daily.  10/14/11  Yes Historical Provider, MD  triamterene-hydrochlorothiazide (MAXZIDE-25) 37.5-25 MG per tablet Take 0.5 tablets by mouth daily.   Yes Historical Provider, MD  VITAMIN E PO Take 1 capsule by mouth daily.   Yes Historical Provider, MD    Allergies:  Allergies  Allergen Reactions  . Morphine And Related   . Actamin (Acetaminophen)   . Actonel (Risedronate Sodium)   . Amlodipine   . Avapro (Irbesartan)   . Cardio Complete (Nutritional  Supplements)   . Cardizem (Diltiazem Hcl)   . Ciprofloxacin   . Codeine   . Dilaudid (Hydromorphone Hcl)   . Fentanyl   . Forteo (Teriparatide (Recombinant))   . Fosamax (Alendronate Sodium)   . Inapsine (Droperidol)   . Ivp Dye (Iodinated Diagnostic Agents)   . Lipitor (Atorvastatin)   . Lisinopril   . Micardis (Telmisartan)   . Septra (Sulfamethoxazole W-Trimethoprim)   . Shellfish Allergy   . Tekturna (Aliskiren)   . Tricor (Fenofibrate)   . Tussionex Pennkinetic Er (Hydrocod Polst-Cpm Polst Er)     Social History:   reports that she has quit smoking. She does not have any smokeless tobacco history on file. She reports that she does not drink alcohol or use illicit drugs.   Family History:  Mother had CAD, and DM  Father had CHF  Physical Exam:  GEN:  Pleasant 79 year old  Well nourished and well developed Elderly Caucasian Female examined  and in no acute distress; cooperative with exam Filed Vitals:   08/20/12 2100 08/20/12 2200 08/20/12 2230 08/20/12 2330  BP: 164/148 175/57 164/53 128/94  Pulse: 62 63 69 64  Temp:      TempSrc:      Resp:      Height:      Weight:      SpO2:       Blood pressure 128/94, pulse 64, temperature 99.2 F (37.3 C), temperature source Oral, resp. rate 18, height 5\' 2"  (1.575 m), weight 57.153 kg (126 lb), SpO2 95.00%. PSYCH: She is alert and oriented x4; does not appear anxious does not appear depressed; affect is normal HEENT: Normocephalic and Atraumatic, Mucous membranes pink; PERRLA; EOM intact; Fundi:  Benign;  No scleral icterus, Nares: Patent, Oropharynx: Clear, Fair Dentition, Neck:  FROM, no cervical lymphadenopathy nor thyromegaly or carotid bruit; no JVD; Breasts:: Not examined CHEST WALL: No tenderness CHEST: Normal respiration, clear to auscultation bilaterally HEART: Regular rate and rhythm; no murmurs rubs or gallops BACK: No kyphosis or scoliosis; no CVA tenderness ABDOMEN: Positive Bowel Sounds, soft non-tender; no  masses, no organomegaly, No Rebound No Guarding.       Rectal Exam: Not done EXTREMITIES: No cyanosis, clubbing or edema; no ulcerations. Genitalia: not examined PULSES: 2+ and symmetric SKIN: Normal hydration no rash or ulceration CNS: Cranial nerves 2-12 grossly intact no focal neurologic deficit   Labs & Imaging Results for orders placed during the hospital encounter of 08/20/12 (from the past 48 hour(s))  CBC WITH DIFFERENTIAL     Status: Abnormal   Collection Time    08/20/12  6:01 PM      Result Value Range   WBC 12.5 (*) 4.0 - 10.5 K/uL   RBC 4.53  3.87 - 5.11 MIL/uL   Hemoglobin 13.5  12.0 - 15.0 g/dL   HCT 69.6  29.5 - 28.4 %  MCV 85.7  78.0 - 100.0 fL   MCH 29.8  26.0 - 34.0 pg   MCHC 34.8  30.0 - 36.0 g/dL   RDW 16.1  09.6 - 04.5 %   Platelets 219  150 - 400 K/uL   Neutrophils Relative 81 (*) 43 - 77 %   Neutro Abs 10.1 (*) 1.7 - 7.7 K/uL   Lymphocytes Relative 13  12 - 46 %   Lymphs Abs 1.7  0.7 - 4.0 K/uL   Monocytes Relative 5  3 - 12 %   Monocytes Absolute 0.6  0.1 - 1.0 K/uL   Eosinophils Relative 1  0 - 5 %   Eosinophils Absolute 0.1  0.0 - 0.7 K/uL   Basophils Relative 0  0 - 1 %   Basophils Absolute 0.0  0.0 - 0.1 K/uL  COMPREHENSIVE METABOLIC PANEL     Status: Abnormal   Collection Time    08/20/12  6:01 PM      Result Value Range   Sodium 138  135 - 145 mEq/L   Potassium 4.0  3.5 - 5.1 mEq/L   Chloride 99  96 - 112 mEq/L   CO2 28  19 - 32 mEq/L   Glucose, Bld 125 (*) 70 - 99 mg/dL   BUN 24 (*) 6 - 23 mg/dL   Creatinine, Ser 4.09 (*) 0.50 - 1.10 mg/dL   Calcium 81.1  8.4 - 91.4 mg/dL   Total Protein 7.5  6.0 - 8.3 g/dL   Albumin 4.2  3.5 - 5.2 g/dL   AST 27  0 - 37 U/L   ALT 23  0 - 35 U/L   Alkaline Phosphatase 61  39 - 117 U/L   Total Bilirubin 0.4  0.3 - 1.2 mg/dL   GFR calc non Af Amer 44 (*) >90 mL/min   GFR calc Af Amer 51 (*) >90 mL/min   Comment:            The eGFR has been calculated     using the CKD EPI equation.     This  calculation has not been     validated in all clinical     situations.     eGFR's persistently     <90 mL/min signify     possible Chronic Kidney Disease.  URINALYSIS, ROUTINE W REFLEX MICROSCOPIC     Status: Abnormal   Collection Time    08/20/12  8:21 PM      Result Value Range   Color, Urine YELLOW  YELLOW   APPearance CLOUDY (*) CLEAR   Specific Gravity, Urine 1.012  1.005 - 1.030   pH 6.0  5.0 - 8.0   Glucose, UA NEGATIVE  NEGATIVE mg/dL   Hgb urine dipstick TRACE (*) NEGATIVE   Bilirubin Urine NEGATIVE  NEGATIVE   Ketones, ur 15 (*) NEGATIVE mg/dL   Protein, ur 30 (*) NEGATIVE mg/dL   Urobilinogen, UA 0.2  0.0 - 1.0 mg/dL   Nitrite NEGATIVE  NEGATIVE   Leukocytes, UA NEGATIVE  NEGATIVE  URINE MICROSCOPIC-ADD ON     Status: Abnormal   Collection Time    08/20/12  8:21 PM      Result Value Range   Squamous Epithelial / LPF FEW (*) RARE   WBC, UA 0-2  <3 WBC/hpf   Bacteria, UA MANY (*) RARE     Radiological Exams on Admission: Ct Abdomen Pelvis Wo Contrast  08/20/2012  *RADIOLOGY REPORT*  Clinical Data: Left lower quadrant pain and nausea.  CT ABDOMEN AND PELVIS WITHOUT CONTRAST  Technique:  Multidetector CT imaging of the abdomen and pelvis was performed following the standard protocol without intravenous contrast.  Comparison: CT abdomen and pelvis 04/22/2010.  Findings: Mild dependent atelectasis is seen in the lung bases. Very small hiatal hernia is noted.  No pleural or pericardial effusion.  Lobulated appearance of both kidneys is unchanged.  Cyst off the upper pole of the left kidney is again identified.  The patient is status post cholecystectomy.  The liver, spleen and pancreas are unremarkable.  Thickening of the adrenal glands is stable in appearance and compatible with adrenal hyperplasia. Atherosclerosis in a nonaneurysmal aorta is identified.  Scattered colonic diverticula without evidence of diverticulitis is seen. The small bowel is unremarkable.  No  lymphadenopathy or fluid is identified. The patient is status post hysterectomy.  Multilevel spondylosis and scoliosis is noted.  Old right intertrochanteric fracture with fixation hardware in place is noted.  IMPRESSION:  1.  No acute abnormality. 2.  Diverticulosis without diverticulitis. 3.  Scoliosis and spondylosis. 4.  Atherosclerosis.   Original Report Authenticated By: Holley Dexter, M.D.     .   Assessment/Plan Principal Problem:   LLQ abdominal pain Active Problems:   DM (diabetes mellitus)   CAD (coronary artery disease)   HTN (hypertension)     1.   LLQ ABD pain-  No clear Etiology, Observe, and Pain Control PRN, May need GI Evaluation.     2.   DM-  Diet and Exercise Controlled,  Monitor Glucose levels and SSI coverage PRN.     3.   CAD- stable.     4.   HTN-  Stable on meds.     5.   DVT prophylaxis with Lovenox.         Code Status:  FULL CODE Family Communication:     Husband at Bedside Disposition Plan:     Return to Home on Discharge Time spent:  73 Minutes   Ron [name] [facility] Pager [phone]  If 7PM-7AM, please contact night-coverage [email or web address] Password [email or web address] 08/21/2012, 12:20 AM

## 2012-08-21 NOTE — Consult Note (Signed)
Referring Provider: Dr. Roseanne Reno Primary Care Physician:  Lillia Mountain, MD Primary Gastroenterologist:  Dr. Josefa Half  Reason for Consultation:  Abdominal pain  HPI: [name] is a 79 y.o. female had the acute onset of focal LLQ pain last Tuesday that was constant but worse with lying down or walking and resolved with bending forward. Nonradiating and denies previous episodes of this pain. No N/V/diarrhea/constipation/melena/hematochezia. Reports normal formed stools until this morning when she had an episode of nonbloody loose stools. Reports colonoscopy last year that showed diverticulosis. CT negative for diverticulitis. Feels better today with pain being much less than it had been that she attributes to her antibiotics she has received here. No further diarrhea thus far since the episode she had this morning.  Past Medical History  Diagnosis Date  . CAD (coronary artery disease)   . Renal artery stenosis   . HTN (hypertension)   . Dyslipidemia   . DM (diabetes mellitus)   . GERD (gastroesophageal reflux disease)   . Osteoporosis     Past Surgical History  Procedure Laterality Date  . Right hip fx 2011    . Left wrist fx 2012    . Abdominal hysterectomy    . Cholecystectomy    . Bladder tac      x3  . Cardiac catheterization    . Neck sx    . Breast surgery      breast reduction 1985  . Left renal stent      Prior to Admission medications   Medication Sig Start Date End Date Taking? Authorizing Provider  aspirin 325 MG tablet Take 325 mg by mouth at bedtime.   Yes Historical Provider, MD  betamethasone dipropionate (DIPROLENE) 0.05 % cream Apply 1 application topically daily as needed (irritated skin).   Yes Historical Provider, MD  carvedilol (COREG) 25 MG tablet Take 25 mg by mouth 2 (two) times daily with a meal.   Yes Historical Provider, MD  cholecalciferol (VITAMIN D) 1000 UNITS tablet Take 1,000 Units by mouth daily.   Yes Historical Provider, MD   cloNIDine (CATAPRES) 0.1 MG tablet Take 0.1 mg by mouth daily as needed (For systolic BP >210). 12/22/11 12/21/12 Yes Hollice Espy, MD  Cod Liver Oil CAPS Take 1 capsule by mouth daily.   Yes Historical Provider, MD  esomeprazole (NEXIUM) 40 MG capsule Take 40 mg by mouth 2 (two) times daily.   Yes Historical Provider, MD  fexofenadine (ALLEGRA) 180 MG tablet Take 180 mg by mouth daily.   Yes Historical Provider, MD  meclizine (ANTIVERT) 25 MG tablet Take 25 mg by mouth 3 (three) times daily as needed for dizziness.    Yes Historical Provider, MD  Multiple Minerals-Vitamins (CITRACAL PLUS) TABS Take 3 tablets by mouth 2 (two) times daily.   Yes Historical Provider, MD  Multiple Vitamins-Minerals (PRESERVISION AREDS) CAPS Take 1 capsule by mouth daily.   Yes Historical Provider, MD  nitroGLYCERIN (NITROSTAT) 0.4 MG SL tablet Place 0.4 mg under the tongue every 5 (five) minutes as needed for chest pain.   Yes Historical Provider, MD  olmesartan (BENICAR) 20 MG tablet Take 10 mg by mouth 2 (two) times daily.    Yes Historical Provider, MD  simvastatin (ZOCOR) 20 MG tablet Take 20 mg by mouth daily.  10/14/11  Yes Historical Provider, MD  triamterene-hydrochlorothiazide (MAXZIDE-25) 37.5-25 MG per tablet Take 0.5 tablets by mouth daily.   Yes Historical Provider, MD  VITAMIN E PO Take 1 capsule by mouth  daily.   Yes Historical Provider, MD    Scheduled Meds: . [COMPLETED] sodium chloride   Intravenous STAT  . aspirin  325 mg Oral QHS  . carvedilol  25 mg Oral BID WC  . cholecalciferol  1,000 Units Oral Daily  . fluconazole  150 mg Oral Daily  . insulin aspart  0-9 Units Subcutaneous TID WC  . pantoprazole  40 mg Oral Daily  . simvastatin  20 mg Oral Daily  . triamterene-hydrochlorothiazide  0.5 tablet Oral Daily    History reviewed. No pertinent family history.  History   Social History  . Marital Status: Married    Spouse Name: N/A    Number of Children: N/A  . Years of Education: N/A    Occupational History  . Not on file.   Social History Main Topics  . Smoking status: Former Games developer  . Smokeless tobacco: Not on file  . Alcohol Use: No  . Drug Use: No  . Sexually Active:    Other Topics Concern  . Not on file   Social History Narrative  . No narrative on file    Review of Systems: All negative except as stated above in HPI.  Physical Exam: Vital signs: Filed Vitals:   08/21/12 0535  BP: 119/46  Pulse: 62  Temp: 97.8 F (36.6 C)  Resp: 16     General:   Elderly, Alert,  Well-developed, well-nourished, pleasant and cooperative in NAD HEENT: anicteric Neck: supple, nontender Lungs:  Clear throughout to auscultation.   No wheezes, crackles, or rhonchi. No acute distress. Heart:  Regular rate and rhythm; no murmurs, clicks, rubs,  or gallops. Abdomen: focal tenderness in left inguinal area otherwise nontender, soft, nondistended, +BS  Rectal:  Deferred Ext: no edema Skin: no rashes noted  GI:  Lab Results:  Recent Labs  08/20/12 1801 08/21/12 0735  WBC 12.5* 7.0  HGB 13.5 11.4*  HCT 38.8 32.9*  PLT 219 167   BMET  Recent Labs  08/20/12 1801 08/21/12 0735  NA 138 139  K 4.0 3.6  CL 99 104  CO2 28 28  GLUCOSE 125* 103*  BUN 24* 18  CREATININE 1.15* 1.02  CALCIUM 10.0 8.7   LFT  Recent Labs  08/20/12 1801  PROT 7.5  ALBUMIN 4.2  AST 27  ALT 23  ALKPHOS 61  BILITOT 0.4   PT/INR No results found for this basename: LABPROT, INR,  in the last 72 hours   Studies/Results: Ct Abdomen Pelvis Wo Contrast  08/20/2012  *RADIOLOGY REPORT*  Clinical Data: Left lower quadrant pain and nausea.  CT ABDOMEN AND PELVIS WITHOUT CONTRAST  Technique:  Multidetector CT imaging of the abdomen and pelvis was performed following the standard protocol without intravenous contrast.  Comparison: CT abdomen and pelvis 04/22/2010.  Findings: Mild dependent atelectasis is seen in the lung bases. Very small hiatal hernia is noted.  No pleural or  pericardial effusion.  Lobulated appearance of both kidneys is unchanged.  Cyst off the upper pole of the left kidney is again identified.  The patient is status post cholecystectomy.  The liver, spleen and pancreas are unremarkable.  Thickening of the adrenal glands is stable in appearance and compatible with adrenal hyperplasia. Atherosclerosis in a nonaneurysmal aorta is identified.  Scattered colonic diverticula without evidence of diverticulitis is seen. The small bowel is unremarkable.  No lymphadenopathy or fluid is identified. The patient is status post hysterectomy.  Multilevel spondylosis and scoliosis is noted.  Old right intertrochanteric fracture with  fixation hardware in place is noted.  IMPRESSION:  1.  No acute abnormality. 2.  Diverticulosis without diverticulitis. 3.  Scoliosis and spondylosis. 4.  Atherosclerosis.   Original Report Authenticated By: Holley Dexter, M.D.     Impression/Plan: 79 yo with focal LLQ/left inguinal pain that improved with bending forward and was unrelated to defecation or eating. No change in bowel habits until today with one loose stool. History is NOT consistent with ischemic colitis and doubt diverticulitis with a normal CT although CT was noncontrast. No signs of infectious colitis and only had diarrhea once today and pain started Tuesday. Pain is in the area of the inguinal ligament and pain could have been due to this ligament getting irritated. No L inguinal hernia appreciated. I do NOT think the pain is due to colitis or diverticulitis. IV hydration helping. She feels her pain is a lot better today than at onset. Will change to soft diet for dinner. C. Diff possible due to her age and being in the hospital as a source of the diarrhea she had today but the one time episode makes it less likely but agree with checking it. Continue supportive care. If ok tolerating soft diet, then can advance her tomorrow. Thank you for this consultation.    LOS: 1 day    Anayah Arvanitis C.  08/21/2012, 3:34 PM

## 2012-08-21 NOTE — Progress Notes (Signed)
Subjective:  Still having LLQ abd pain, demerol helpful, one watery bowel movement this am without blood. Some vaginal itching.  Objective: Vital signs in last 24 hours: Temp:  [97.7 F (36.5 C)-99.2 F (37.3 C)] 97.8 F (36.6 C) (05/10 0535) Pulse Rate:  [60-77] 62 (05/10 0535) Resp:  [16-20] 16 (05/10 0535) BP: (119-176)/(46-148) 119/46 mmHg (05/10 0535) SpO2:  [94 %-97 %] 96 % (05/10 0535) Weight:  [57.153 kg (126 lb)-57.516 kg (126 lb 12.8 oz)] 57.516 kg (126 lb 12.8 oz) (05/10 0105) Weight change:     Intake/Output from previous day: 05/09 0701 - 05/10 0700 In: 727.5 [I.V.:727.5] Out: 300 [Urine:300] Intake/Output this shift:    General appearance: alert and cooperative Resp: clear to auscultation bilaterally Cardio: regular rate and rhythm, S1, S2 normal, no murmur, click, rub or gallop GI: abnormal findings:  soft, LLQ tenderness, no rebound or guarding, normal BS Extremities: extremities normal, atraumatic, no cyanosis or edema  Lab Results:  Recent Labs  08/20/12 1801 08/21/12 0735  WBC 12.5* 7.0  HGB 13.5 11.4*  HCT 38.8 32.9*  PLT 219 167   BMET  Recent Labs  08/20/12 1801 08/21/12 0735  NA 138 139  K 4.0 3.6  CL 99 104  CO2 28 28  GLUCOSE 125* 103*  BUN 24* 18  CREATININE 1.15* 1.02  CALCIUM 10.0 8.7    Studies/Results: Ct Abdomen Pelvis Wo Contrast  08/20/2012  *RADIOLOGY REPORT*  Clinical Data: Left lower quadrant pain and nausea.  CT ABDOMEN AND PELVIS WITHOUT CONTRAST  Technique:  Multidetector CT imaging of the abdomen and pelvis was performed following the standard protocol without intravenous contrast.  Comparison: CT abdomen and pelvis 04/22/2010.  Findings: Mild dependent atelectasis is seen in the lung bases. Very small hiatal hernia is noted.  No pleural or pericardial effusion.  Lobulated appearance of both kidneys is unchanged.  Cyst off the upper pole of the left kidney is again identified.  The patient is status post  cholecystectomy.  The liver, spleen and pancreas are unremarkable.  Thickening of the adrenal glands is stable in appearance and compatible with adrenal hyperplasia. Atherosclerosis in a nonaneurysmal aorta is identified.  Scattered colonic diverticula without evidence of diverticulitis is seen. The small bowel is unremarkable.  No lymphadenopathy or fluid is identified. The patient is status post hysterectomy.  Multilevel spondylosis and scoliosis is noted.  Old right intertrochanteric fracture with fixation hardware in place is noted.  IMPRESSION:  1.  No acute abnormality. 2.  Diverticulosis without diverticulitis. 3.  Scoliosis and spondylosis. 4.  Atherosclerosis.   Original Report Authenticated By: Holley Dexter, M.D.     Medications: I have reviewed the patient's current medications.  Assessment/Plan: Principal Problem:   LLQ abdominal pain , etiology unclear, on episode diarrhea today, diff is ischemia, colitis, diverticulitis (although not seen on CT).  Check stool studies.  Ask GI to see.  Continue empiric antibiotics Active Problems:   DM (diabetes mellitus) controlled on SSI   CAD (coronary artery disease) stable   HTN (hypertension) ok on carvedilol, diuretic on hold   Vaginitis diflucan for 2 days   LOS: 1 day   Kema Santaella JOSEPH 08/21/2012, 9:05 AM

## 2012-08-22 LAB — CBC WITH DIFFERENTIAL/PLATELET
Basophils Relative: 0 % (ref 0–1)
HCT: 35.5 % — ABNORMAL LOW (ref 36.0–46.0)
Hemoglobin: 12 g/dL (ref 12.0–15.0)
Lymphocytes Relative: 35 % (ref 12–46)
Lymphs Abs: 2.5 10*3/uL (ref 0.7–4.0)
Monocytes Absolute: 0.6 10*3/uL (ref 0.1–1.0)
Monocytes Relative: 9 % (ref 3–12)
Neutro Abs: 3.8 10*3/uL (ref 1.7–7.7)
Neutrophils Relative %: 54 % (ref 43–77)
RBC: 4.12 MIL/uL (ref 3.87–5.11)
WBC: 7.1 10*3/uL (ref 4.0–10.5)

## 2012-08-22 LAB — GLUCOSE, CAPILLARY
Glucose-Capillary: 123 mg/dL — ABNORMAL HIGH (ref 70–99)
Glucose-Capillary: 100 mg/dL — ABNORMAL HIGH (ref 70–99)
Glucose-Capillary: 142 mg/dL — ABNORMAL HIGH (ref 70–99)

## 2012-08-22 LAB — BASIC METABOLIC PANEL
BUN: 12 mg/dL (ref 6–23)
Chloride: 105 mEq/L (ref 96–112)
GFR calc Af Amer: 58 mL/min — ABNORMAL LOW (ref >90)
GFR calc non Af Amer: 50 mL/min — ABNORMAL LOW (ref >90)
Potassium: 3.5 mEq/L (ref 3.5–5.1)
Sodium: 142 mEq/L (ref 135–145)

## 2012-08-22 NOTE — Progress Notes (Signed)
Subjective: Pain about the same, worse with movement, relieved by leaning forward.  2 loose stools last 24 hours  Objective: Vital signs in last 24 hours: Temp:  [97.4 F (36.3 C)-98.1 F (36.7 C)] 98.1 F (36.7 C) (05/11 0640) Pulse Rate:  [63-67] 67 (05/11 0640) Resp:  [18-19] 19 (05/11 0640) BP: (143-169)/(56-57) 143/57 mmHg (05/11 0640) SpO2:  [93 %-98 %] 93 % (05/11 0640) Weight change:  Last BM Date: 08/21/12  Intake/Output from previous day: 05/10 0701 - 05/11 0700 In: 2027.5 [P.O.:600; I.V.:1427.5] Out: 1150 [Urine:1150] Intake/Output this shift:    General appearance: alert and cooperative Resp: clear to auscultation bilaterally Cardio: regular rate and rhythm, S1, S2 normal, no murmur, click, rub or gallop GI: tender over L inguinal ligament  Lab Results:  Recent Labs  08/21/12 0735 08/22/12 0635  WBC 7.0 7.1  HGB 11.4* 12.0  HCT 32.9* 35.5*  PLT 167 191   BMET  Recent Labs  08/20/12 1801 08/21/12 0735  NA 138 139  K 4.0 3.6  CL 99 104  CO2 28 28  GLUCOSE 125* 103*  BUN 24* 18  CREATININE 1.15* 1.02  CALCIUM 10.0 8.7    Studies/Results: Ct Abdomen Pelvis Wo Contrast  08/20/2012  *RADIOLOGY REPORT*  Clinical Data: Left lower quadrant pain and nausea.  CT ABDOMEN AND PELVIS WITHOUT CONTRAST  Technique:  Multidetector CT imaging of the abdomen and pelvis was performed following the standard protocol without intravenous contrast.  Comparison: CT abdomen and pelvis 04/22/2010.  Findings: Mild dependent atelectasis is seen in the lung bases. Very small hiatal hernia is noted.  No pleural or pericardial effusion.  Lobulated appearance of both kidneys is unchanged.  Cyst off the upper pole of the left kidney is again identified.  The patient is status post cholecystectomy.  The liver, spleen and pancreas are unremarkable.  Thickening of the adrenal glands is stable in appearance and compatible with adrenal hyperplasia. Atherosclerosis in a nonaneurysmal aorta  is identified.  Scattered colonic diverticula without evidence of diverticulitis is seen. The small bowel is unremarkable.  No lymphadenopathy or fluid is identified. The patient is status post hysterectomy.  Multilevel spondylosis and scoliosis is noted.  Old right intertrochanteric fracture with fixation hardware in place is noted.  IMPRESSION:  1.  No acute abnormality. 2.  Diverticulosis without diverticulitis. 3.  Scoliosis and spondylosis. 4.  Atherosclerosis.   Original Report Authenticated By: Holley Dexter, M.D.     Medications: I have reviewed the patient's current medications.  Assessment/Plan: Principal Problem:  LLQ abdominal pain , appreciate GI input, clearly today tenderness is in area of inguinal ligament, D/C antibiotics and IVFs, ambulate and probably D/C in am. Active Problems:  Diarrhea, only 2 episodes, could be related to antibiotics, will check C Diff but unlikely DM (diabetes mellitus) controlled on SSI  CAD (coronary artery disease) stable  HTN (hypertension) ok on carvedilol, diuretic on hold  Vaginitis diflucan for 2 days   LOS: 2 days   [name] JOSEPH 08/22/2012, 8:22 AM

## 2012-08-22 NOTE — Progress Notes (Signed)
Patient ID: [name], female   DOB: [birth year], 79 y.o.   MRN: [id number]  Sitting in chair stating that pain is still in same area of L inguinal area and now feels pain in her left lower back. Husband concerned it is due to her kidney stent.  Ate small amount of food but does not like the taste.  Her pain is not due to her gut and suspect her loose stool X 2 yesterday was due to her oral contrast more so than the antibiotics.  No further recs. Defer kidney stent discussion to Dr. Valentina Lucks and per pt she has been already reassured about that by him. Conservative management if inguinal hernia is the source. Pain control per Dr. Valentina Lucks. Will sign off. Call with questions.

## 2012-08-23 LAB — GLUCOSE, CAPILLARY: Glucose-Capillary: 145 mg/dL — ABNORMAL HIGH (ref 70–99)

## 2012-08-23 MED ORDER — HYDROCODONE-ACETAMINOPHEN 5-325 MG PO TABS: 1.0000 | ORAL_TABLET | Freq: Four times a day (QID) | ORAL | Status: DC | PRN

## 2012-08-23 NOTE — Discharge Summary (Signed)
Physician Discharge Summary  Patient ID: [name] MRN: [id number] DOB/AGE: [id number] 77 y.o.  Admit date: 08/20/2012 Discharge date: 08/23/2012  Admission Diagnoses: Left lower quadrant abdominal pain  Diabetes mellitus Coronary artery disease Hypertension  Discharge Diagnoses:  Principal Problem:   Left inguinal pain Active Problems:   DM (diabetes mellitus)   CAD (coronary artery disease)   HTN (hypertension)   Discharged Condition: good  Hospital Course: The patient was admitted on May 10 complaining of severe left lower quadrant pain for 4 days. Initial lab work showed WBC of 12.5 and normal electrolytes and baseline kidney function. A CT scan of the abdomen showed no acute abnormality, diverticulosis without diverticulitis. The patient was treated with empiric antibiotics. She was seen by Dr. Bosie Clos of the gastroenterology service. He felt that the history was not consistent with ischemic colitis or.diverticulitis. He felt the pain was most likely related to inguinal ligament pain. The patient did have 2 watery bowel movements, C. difficile PCR was negative. The patient's pain was controlled with IV narcotics and at discharge hydrocodone. Her pain was definitely better when she sat up or should she walks or lean backwards. She also developed some left low back pain which was felt musculoskeletal.  Consults: GI  Significant Diagnostic Studies: labs: As above, CBC at discharge WBC 7.1 hemoglobin 12.0 and radiology: CT scan: As above  Treatments: IV hydration and analgesia: Demerol  Discharge Exam: Blood pressure 155/61, pulse 68, temperature 97.9 F (36.6 C), temperature source Oral, resp. rate 18, height 5' 2.4" (1.585 m), weight 57.516 kg (126 lb 12.8 oz), SpO2 92.00%. GI: Tender directly over the left inguinal ligament and without hernia  Disposition: 01-Home or Self Care     Medication List    TAKE these medications       aspirin 325 MG tablet  Take 325 mg by  mouth at bedtime.     betamethasone dipropionate 0.05 % cream  Commonly known as:  DIPROLENE  Apply 1 application topically daily as needed (irritated skin).     carvedilol 25 MG tablet  Commonly known as:  COREG  Take 25 mg by mouth 2 (two) times daily with a meal.     cholecalciferol 1000 UNITS tablet  Commonly known as:  VITAMIN D  Take 1,000 Units by mouth daily.     Citracal Plus Tabs  Take 3 tablets by mouth 2 (two) times daily.     cloNIDine 0.1 MG tablet  Commonly known as:  CATAPRES  Take 0.1 mg by mouth daily as needed (For systolic BP >210).     Cod Liver Oil Caps  Take 1 capsule by mouth daily.     esomeprazole 40 MG capsule  Commonly known as:  NEXIUM  Take 40 mg by mouth 2 (two) times daily.     fexofenadine 180 MG tablet  Commonly known as:  ALLEGRA  Take 180 mg by mouth daily.     HYDROcodone-acetaminophen 5-325 MG per tablet  Commonly known as:  NORCO/VICODIN  Take 1 tablet by mouth every 6 (six) hours as needed for pain.     meclizine 25 MG tablet  Commonly known as:  ANTIVERT  Take 25 mg by mouth 3 (three) times daily as needed for dizziness.     nitroGLYCERIN 0.4 MG SL tablet  Commonly known as:  NITROSTAT  Place 0.4 mg under the tongue every 5 (five) minutes as needed for chest pain.     olmesartan 20 MG tablet  Commonly known as:  BENICAR  Take 10 mg by mouth 2 (two) times daily.     PreserVision AREDS Caps  Take 1 capsule by mouth daily.     simvastatin 20 MG tablet  Commonly known as:  ZOCOR  Take 20 mg by mouth daily.     triamterene-hydrochlorothiazide 37.5-25 MG per tablet  Commonly known as:  MAXZIDE-25  Take 0.5 tablets by mouth daily.     VITAMIN E PO  Take 1 capsule by mouth daily.           Follow-up Information   Follow up In 1 week.      Follow up with Lillia Mountain, MD.   Contact information:   [address], [address] Jaynie Crumble [place] [address] [phone]        Signed: Lillia Mountain 08/23/2012, 7:46 AM

## 2012-08-26 LAB — STOOL CULTURE

## 2012-11-17 ENCOUNTER — Other Ambulatory Visit: Payer: Self-pay

## 2012-11-22 ENCOUNTER — Other Ambulatory Visit: Payer: Self-pay | Admitting: Internal Medicine

## 2012-11-22 DIAGNOSIS — M7989 Other specified soft tissue disorders: Secondary | ICD-10-CM

## 2012-11-22 DIAGNOSIS — N63 Unspecified lump in unspecified breast: Secondary | ICD-10-CM

## 2013-01-06 ENCOUNTER — Other Ambulatory Visit: Payer: Self-pay

## 2013-01-06 ENCOUNTER — Telehealth: Payer: Self-pay | Admitting: Internal Medicine

## 2013-01-06 ENCOUNTER — Emergency Department (HOSPITAL_COMMUNITY)
Admission: EM | Admit: 2013-01-06 | Discharge: 2013-01-07 | Disposition: A | Discharge status: Home / Self Care | Payer: Medicare Other | Attending: Emergency Medicine | Admitting: Emergency Medicine

## 2013-01-06 ENCOUNTER — Encounter (HOSPITAL_COMMUNITY): Payer: Self-pay | Admitting: Emergency Medicine

## 2013-01-06 DIAGNOSIS — Z8739 Personal history of other diseases of the musculoskeletal system and connective tissue: Secondary | ICD-10-CM | POA: Insufficient documentation

## 2013-01-06 DIAGNOSIS — K219 Gastro-esophageal reflux disease without esophagitis: Secondary | ICD-10-CM | POA: Insufficient documentation

## 2013-01-06 DIAGNOSIS — I2510 Atherosclerotic heart disease of native coronary artery without angina pectoris: Secondary | ICD-10-CM | POA: Insufficient documentation

## 2013-01-06 DIAGNOSIS — E785 Hyperlipidemia, unspecified: Secondary | ICD-10-CM | POA: Insufficient documentation

## 2013-01-06 DIAGNOSIS — I10 Essential (primary) hypertension: Secondary | ICD-10-CM

## 2013-01-06 DIAGNOSIS — Z79899 Other long term (current) drug therapy: Secondary | ICD-10-CM | POA: Insufficient documentation

## 2013-01-06 DIAGNOSIS — E119 Type 2 diabetes mellitus without complications: Secondary | ICD-10-CM | POA: Insufficient documentation

## 2013-01-06 DIAGNOSIS — Z87891 Personal history of nicotine dependence: Secondary | ICD-10-CM | POA: Insufficient documentation

## 2013-01-06 LAB — COMPREHENSIVE METABOLIC PANEL
ALT: 20 U/L (ref 0–35)
Alkaline Phosphatase: 63 U/L (ref 39–117)
CO2: 27 mEq/L (ref 19–32)
Chloride: 101 mEq/L (ref 96–112)
GFR calc Af Amer: 50 mL/min — ABNORMAL LOW (ref >90)
GFR calc non Af Amer: 43 mL/min — ABNORMAL LOW (ref >90)
Glucose, Bld: 139 mg/dL — ABNORMAL HIGH (ref 70–99)
Potassium: 4.4 mEq/L (ref 3.5–5.1)
Sodium: 138 mEq/L (ref 135–145)
Total Bilirubin: 0.3 mg/dL (ref 0.3–1.2)

## 2013-01-06 LAB — CBC WITH DIFFERENTIAL/PLATELET
Hemoglobin: 13.1 g/dL (ref 12.0–15.0)
Lymphocytes Relative: 26 % (ref 12–46)
Lymphs Abs: 1.9 10*3/uL (ref 0.7–4.0)
Neutrophils Relative %: 65 % (ref 43–77)
Platelets: 212 10*3/uL (ref 150–400)
RBC: 4.35 MIL/uL (ref 3.87–5.11)
WBC: 7.3 10*3/uL (ref 4.0–10.5)

## 2013-01-06 NOTE — ED Notes (Signed)
Pt. reports elevated blood pressure this evening at home ( 239/104) . Denies pain or discomfort . Respirations unlabored . Pt. occasional " indigestion " this week.

## 2013-01-06 NOTE — ED Notes (Addendum)
At 2000 pt felt as though she was going to pass out took bp and was 180 systolic. Feeling only lasted a few seconds. Pt took bp later and was 200 systolic. Pt also reporting some mild indigestion. Taking tums without relief. Hx of MI in past with sx of indigestion and she was cold and clammy. Pt skin warm and dry at this time. No pain.

## 2013-01-06 NOTE — Telephone Encounter (Signed)
[name] called stating that her BP has been very elevated, running in the 230/120 range.  She denies chest pain or headache or any other concerning symptoms.  Has had indigestion for the past week but BP has been normal until tonight.  She has clonidine at home PRN SBP > 210.  I asked her to take one of these and recheck her BP in 30 minutes.  If SBP not < 190, I asked her to come into the ED.  She agreed with plan.

## 2013-01-07 NOTE — ED Provider Notes (Signed)
CSN: [id number]     Arrival date & time 01/06/13  2252 History   First MD Initiated Contact with Patient 01/06/13 2312     Chief Complaint  Patient presents with  . Hypertension   (Consider location/radiation/quality/duration/timing/severity/associated sxs/prior Treatment) Patient is a 79 y.o. female presenting with hypertension.  Hypertension This is a chronic problem. Episode onset: worsened 4 hours ago. The problem occurs constantly. The problem has been gradually improving. Pertinent negatives include no chest pain, no abdominal pain and no shortness of breath. Associated symptoms comments: Intermittent indigestion for 1 week. Nothing aggravates the symptoms. Nothing relieves the symptoms. Treatments tried: clonidine  The treatment provided moderate relief.    Past Medical History  Diagnosis Date  . CAD (coronary artery disease)   . Renal artery stenosis   . HTN (hypertension)   . Dyslipidemia   . DM (diabetes mellitus)   . GERD (gastroesophageal reflux disease)   . Osteoporosis    Past Surgical History  Procedure Laterality Date  . Right hip fx 2011    . Left wrist fx 2012    . Abdominal hysterectomy    . Cholecystectomy    . Bladder tac      x3  . Cardiac catheterization    . Neck sx    . Breast surgery      breast reduction 1985  . Left renal stent     No family history on file. History  Substance Use Topics  . Smoking status: Former Games developer  . Smokeless tobacco: Not on file  . Alcohol Use: No   OB History   Grav Para Term Preterm Abortions TAB SAB Ect Mult Living                 Review of Systems  Constitutional: Negative for fever and diaphoresis.  HENT: Negative for congestion.   Respiratory: Negative for cough and shortness of breath.   Cardiovascular: Negative for chest pain.  Gastrointestinal: Negative for nausea, vomiting, abdominal pain and diarrhea.  All other systems reviewed and are negative.    Allergies  Fentanyl; Morphine and related;  Actamin; Actonel; Amlodipine; Avapro; Cardio complete; Cardizem; Ciprofloxacin; Codeine; Dilaudid; Forteo; Fosamax; Inapsine; Ivp dye; Lipitor; Lisinopril; Micardis; Septra; Shellfish allergy; Tekturna; Tricor; and Tussionex pennkinetic er  Home Medications   Current Outpatient Rx  Name  Route  Sig  Dispense  Refill  . aspirin 325 MG tablet   Oral   Take 325 mg by mouth at bedtime.         . betamethasone dipropionate (DIPROLENE) 0.05 % cream   Topical   Apply 1 application topically daily as needed (irritated skin).         . carvedilol (COREG) 25 MG tablet   Oral   Take 25 mg by mouth 2 (two) times daily with a meal.         . cholecalciferol (VITAMIN D) 1000 UNITS tablet   Oral   Take 1,000 Units by mouth daily.         [name] EXPIRED: cloNIDine (CATAPRES) 0.1 MG tablet   Oral   Take 0.1 mg by mouth daily as needed (For systolic BP >210).         [name] Cod Liver Oil CAPS   Oral   Take 1 capsule by mouth daily.         [name] esomeprazole (NEXIUM) 40 MG capsule   Oral   Take 40 mg by mouth 2 (two) times daily.         [name]  fexofenadine (ALLEGRA) 180 MG tablet   Oral   Take 180 mg by mouth daily.         . meclizine (ANTIVERT) 25 MG tablet   Oral   Take 25 mg by mouth 3 (three) times daily as needed for dizziness.          . Multiple Minerals-Vitamins (CITRACAL PLUS) TABS   Oral   Take 3 tablets by mouth 2 (two) times daily.         . Multiple Vitamins-Minerals (PRESERVISION AREDS) CAPS   Oral   Take 1 capsule by mouth daily.         . nitroGLYCERIN (NITROSTAT) 0.4 MG SL tablet   Sublingual   Place 0.4 mg under the tongue every 5 (five) minutes as needed for chest pain.         [name] olmesartan (BENICAR) 20 MG tablet   Oral   Take 20 mg by mouth 2 (two) times daily.          . simvastatin (ZOCOR) 20 MG tablet   Oral   Take 20 mg by mouth daily.          [name] triamterene-hydrochlorothiazide (MAXZIDE-25) 37.5-25 MG per tablet   Oral   Take 0.5  tablets by mouth daily.         [name] VITAMIN E PO   Oral   Take 1 capsule by mouth daily.          BP 164/59  Pulse 70  Temp(Src) 98.2 F (36.8 C) (Oral)  Resp 14  SpO2 97% Physical Exam  Nursing note and vitals reviewed. Constitutional: She is oriented to person, place, and time. She appears well-developed and well-nourished. No distress.  HENT:  Head: Normocephalic and atraumatic.  Mouth/Throat: Oropharynx is clear and moist.  Eyes: Conjunctivae are normal. Pupils are equal, round, and reactive to light. No scleral icterus.  Neck: Neck supple.  Cardiovascular: Normal rate, regular rhythm, normal heart sounds and intact distal pulses.   No murmur heard. Pulmonary/Chest: Effort normal and breath sounds normal. No stridor. No respiratory distress. She has no rales.  Abdominal: Soft. Bowel sounds are normal. She exhibits no distension. There is no tenderness.  Musculoskeletal: Normal range of motion.  Neurological: She is alert and oriented to person, place, and time.  Skin: Skin is warm and dry. No rash noted.  Psychiatric: She has a normal mood and affect. Her behavior is normal.    ED Course  Procedures (including critical care time) Labs Review Labs Reviewed  COMPREHENSIVE METABOLIC PANEL - Abnormal; Notable for the following:    Glucose, Bld 139 (*)    BUN 33 (*)    Creatinine, Ser 1.18 (*)    GFR calc non Af Amer 43 (*)    GFR calc Af Amer 50 (*)    All other components within normal limits  CBC WITH DIFFERENTIAL  TROPONIN I  TROPONIN I   EKG - NSR, rate 68, normal axis, normal intervals, no ST/T changes, inferior q waves, similar to prior.    Imaging Review No results found.  MDM   1. Hypertension    79 year old female with a history of coronary artery disease presenting secondary to hypertension. Her blood pressure was 240/100 at home. She was told by her cardiologist's office to take a clonidine and then come to the emergency room if her blood pressure  had not improved. On my exam her blood pressure is 160/60. The only symptoms that she endorses or a few  seconds of lightheadedness without syncope and indigestion which she has experienced for the last week. She reports that her only symptom with her previous MI was indigestion. I have a low suspicion that her symptoms represent ACS, but we'll check troponin and a troponin.  She took a full 325mg  aspirin this evening.    2:29 AM delta troponin negative.  I discussed case with Dr. Jon Billings (cardiology), who agreed with plan to discharge home with followup.  Candyce Churn, MD 01/07/13 [phone]

## 2013-01-26 ENCOUNTER — Ambulatory Visit
Admission: RE | Admit: 2013-01-26 | Discharge: 2013-01-26 | Disposition: A | Discharge status: Home / Self Care | Payer: Medicare Other | Source: Ambulatory Visit | Attending: Internal Medicine | Admitting: Internal Medicine

## 2013-01-26 DIAGNOSIS — M7989 Other specified soft tissue disorders: Secondary | ICD-10-CM

## 2013-01-26 DIAGNOSIS — N63 Unspecified lump in unspecified breast: Secondary | ICD-10-CM

## 2013-01-28 ENCOUNTER — Other Ambulatory Visit: Payer: Self-pay | Admitting: Internal Medicine

## 2013-01-28 DIAGNOSIS — N641 Fat necrosis of breast: Secondary | ICD-10-CM

## 2013-02-09 ENCOUNTER — Ambulatory Visit
Admission: RE | Admit: 2013-02-09 | Discharge: 2013-02-09 | Disposition: A | Discharge status: Home / Self Care | Payer: Medicare Other | Source: Ambulatory Visit | Attending: Internal Medicine | Admitting: Internal Medicine

## 2013-02-09 DIAGNOSIS — N641 Fat necrosis of breast: Secondary | ICD-10-CM

## 2013-02-09 MED ORDER — GADOBENATE DIMEGLUMINE 529 MG/ML IV SOLN
6.0000 mL | Freq: Once | INTRAVENOUS | Status: AC | PRN
  Administered 2013-02-09: 6 mL via INTRAVENOUS

## 2013-02-17 ENCOUNTER — Other Ambulatory Visit: Payer: Self-pay

## 2013-05-11 ENCOUNTER — Ambulatory Visit: Payer: Medicare Other | Attending: Otolaryngology | Admitting: Rehabilitative and Restorative Service Providers"

## 2013-05-11 DIAGNOSIS — IMO0001 Reserved for inherently not codable concepts without codable children: Secondary | ICD-10-CM | POA: Insufficient documentation

## 2013-05-11 DIAGNOSIS — H8110 Benign paroxysmal vertigo, unspecified ear: Secondary | ICD-10-CM | POA: Insufficient documentation

## 2013-05-11 DIAGNOSIS — R42 Dizziness and giddiness: Secondary | ICD-10-CM | POA: Insufficient documentation

## 2013-05-20 ENCOUNTER — Ambulatory Visit: Payer: Medicare Other | Attending: Otolaryngology | Admitting: Rehabilitative and Restorative Service Providers"

## 2013-05-20 DIAGNOSIS — H8110 Benign paroxysmal vertigo, unspecified ear: Secondary | ICD-10-CM | POA: Insufficient documentation

## 2013-05-20 DIAGNOSIS — IMO0001 Reserved for inherently not codable concepts without codable children: Secondary | ICD-10-CM | POA: Insufficient documentation

## 2013-05-20 DIAGNOSIS — R42 Dizziness and giddiness: Secondary | ICD-10-CM | POA: Insufficient documentation

## 2013-05-26 ENCOUNTER — Encounter: Payer: Medicare Other | Admitting: Rehabilitative and Restorative Service Providers"

## 2013-05-27 ENCOUNTER — Ambulatory Visit: Payer: Medicare Other | Admitting: Rehabilitative and Restorative Service Providers"

## 2013-06-02 ENCOUNTER — Encounter: Payer: Medicare Other | Admitting: Rehabilitative and Restorative Service Providers"

## 2013-06-03 ENCOUNTER — Ambulatory Visit: Payer: Medicare Other | Admitting: Rehabilitative and Restorative Service Providers"

## 2013-06-14 ENCOUNTER — Ambulatory Visit: Payer: Medicare Other | Attending: Otolaryngology | Admitting: Rehabilitative and Restorative Service Providers"

## 2013-06-14 DIAGNOSIS — H8110 Benign paroxysmal vertigo, unspecified ear: Secondary | ICD-10-CM | POA: Insufficient documentation

## 2013-06-14 DIAGNOSIS — R42 Dizziness and giddiness: Secondary | ICD-10-CM | POA: Insufficient documentation

## 2013-06-14 DIAGNOSIS — IMO0001 Reserved for inherently not codable concepts without codable children: Secondary | ICD-10-CM | POA: Insufficient documentation

## 2013-07-28 ENCOUNTER — Telehealth: Payer: Self-pay

## 2013-07-28 MED ORDER — NITROGLYCERIN 0.4 MG SL SUBL
0.4000 mg | SUBLINGUAL_TABLET | SUBLINGUAL | Status: DC | PRN
Start: 2013-07-28 — End: 2015-01-01

## 2013-07-28 NOTE — Telephone Encounter (Signed)
Refilled.. Dr. Tamala Julian pt.

## 2013-07-29 ENCOUNTER — Other Ambulatory Visit: Payer: Self-pay

## 2013-09-20 ENCOUNTER — Encounter: Payer: Self-pay | Admitting: Interventional Cardiology

## 2013-10-24 ENCOUNTER — Ambulatory Visit: Payer: Medicare Other | Admitting: Interventional Cardiology

## 2013-11-04 ENCOUNTER — Encounter: Payer: Self-pay | Admitting: Interventional Cardiology

## 2013-11-10 ENCOUNTER — Ambulatory Visit (INDEPENDENT_AMBULATORY_CARE_PROVIDER_SITE_OTHER): Payer: Medicare Other | Admitting: Interventional Cardiology

## 2013-11-10 ENCOUNTER — Encounter: Payer: Self-pay | Admitting: Interventional Cardiology

## 2013-11-10 VITALS — BP 120/90 | HR 65 | Ht 62.0 in | Wt 121.5 lb

## 2013-11-10 DIAGNOSIS — I2510 Atherosclerotic heart disease of native coronary artery without angina pectoris: Secondary | ICD-10-CM

## 2013-11-10 NOTE — Patient Instructions (Signed)
Your physician recommends that you continue on your current medications as directed. Please refer to the Current Medication list given to you today.  Your physician wants you to follow-up in: 1 year with Dr.Smith You will receive a reminder letter in the mail two months in advance. If you don't receive a letter, please call our office to schedule the follow-up appointment.  

## 2013-11-10 NOTE — Progress Notes (Signed)
Patient ID: [name], female   DOB: [birth year], 80 y.o.   MRN: [id number]    [address], [address] [place], [place]  [address] Phone: [phone] Fax:  [phone]  Date:  11/10/2013   ID:  [name], DOB 1931-09-15, MRN [id number]  PCP:  Irven Shelling, MD   ASSESSMENT:  1. coronary artery disease with known chronic total occlusion of the right coronary. Patient is asymptomatic 2. Hypertension, controlled 3. Hyperlipidemia on therapy and followed by primary care 4. Renal artery stenosis  PLAN:  1. Continue aerobic activity including walking as she currently does 2. Clinical followup in one year 3. No specific clinical evaluation is indicated   SUBJECTIVE: [name] is a 80 y.o. female who is due a well. She has chronic total occlusion of the right coronary. She has known history of renal artery stenosis. She has no medication side effects. She takes her medications as prescribed. She denies edema orthopnea. No palpitations or syncope.   Wt Readings from Last 3 Encounters:  11/10/13 121 lb 8 oz (55.112 kg)  08/21/12 126 lb 12.8 oz (57.516 kg)  08/16/12 125 lb 9.6 oz (56.972 kg)     Past Medical History  Diagnosis Date  . CAD (coronary artery disease)   . Renal artery stenosis   . HTN (hypertension)   . Dyslipidemia   . DM (diabetes mellitus)   . GERD (gastroesophageal reflux disease)   . Osteoporosis     Current Outpatient Prescriptions  Medication Sig Dispense Refill  . aspirin 325 MG tablet Take 325 mg by mouth at bedtime.      . betamethasone dipropionate (DIPROLENE) 0.05 % cream Apply 1 application topically daily as needed (irritated skin).      . carvedilol (COREG) 25 MG tablet Take 25 mg by mouth 2 (two) times daily with a meal.      . cholecalciferol (VITAMIN D) 1000 UNITS tablet Take 1,000 Units by mouth daily.      . cloNIDine (CATAPRES) 0.1 MG tablet Take 0.1 mg by mouth daily as needed (For systolic BP >485).      [name] Cod Liver  Oil CAPS Take 1 capsule by mouth daily.      [name] esomeprazole (NEXIUM) 40 MG capsule Take 40 mg by mouth 2 (two) times daily.      . fexofenadine (ALLEGRA) 180 MG tablet Take 180 mg by mouth daily.      . meclizine (ANTIVERT) 25 MG tablet Take 25 mg by mouth 3 (three) times daily as needed for dizziness.       . Multiple Minerals-Vitamins (CITRACAL PLUS) TABS Take 3 tablets by mouth 2 (two) times daily.      . Multiple Vitamins-Minerals (PRESERVISION AREDS) CAPS Take 1 capsule by mouth daily.      . nitroGLYCERIN (NITROSTAT) 0.4 MG SL tablet Place 1 tablet (0.4 mg total) under the tongue every 5 (five) minutes as needed for chest pain.  25 tablet  5  . olmesartan (BENICAR) 20 MG tablet Take 20 mg by mouth 2 (two) times daily.       . simvastatin (ZOCOR) 20 MG tablet Take 20 mg by mouth daily.       [name] triamterene-hydrochlorothiazide (MAXZIDE-25) 37.5-25 MG per tablet Take 0.5 tablets by mouth daily.      [name] VITAMIN E PO Take 1 capsule by mouth daily.       No current facility-administered medications for this visit.    Allergies:  Allergies  Allergen Reactions  . Fentanyl Anaphylaxis  . Morphine And Related Anaphylaxis  . Actamin [Acetaminophen] Rash  . Actonel [Risedronate Sodium] Rash  . Amlodipine Rash  . Avapro [Irbesartan] Rash  . Cardio Complete [Nutritional Supplements] Rash  . Cardizem [Diltiazem Hcl] Rash  . Ciprofloxacin Rash  . Codeine Rash  . Dilaudid [Hydromorphone Hcl] Rash  . Forteo [Teriparatide (Recombinant)] Rash  . Fosamax [Alendronate Sodium] Rash  . Inapsine [Droperidol] Rash  . Ivp Dye [Iodinated Diagnostic Agents] Rash  . Lipitor [Atorvastatin] Rash  . Lisinopril Rash  . Micardis [Telmisartan] Rash  . Septra [Sulfamethoxazole-Trimethoprim] Rash  . Shellfish Allergy Rash  . Tekturna [Aliskiren] Rash  . Tricor [Fenofibrate] Rash  . Tussionex Pennkinetic Er [Hydrocod Polst-Cpm Polst Er] Rash    Social History:  The patient  reports that she has quit  smoking. She does not have any smokeless tobacco history on file. She reports that she does not drink alcohol or use illicit drugs.   ROS:  Please see the history of present illness.   No neurological complaints.   All other systems reviewed and negative.   OBJECTIVE: VS:  BP 120/90  Pulse 65  Ht 5\' 2"  (1.575 m)  Wt 121 lb 8 oz (55.112 kg)  BMI 22.22 kg/m2 Well nourished, well developed, in no acute distress, appears than his stated age 89: normal Neck: JVD flat. Carotid bruit absent  Cardiac:  normal S1, S2; RRR; no murmur Lungs:  clear to auscultation bilaterally, no wheezing, rhonchi or rales Abd: soft, nontender, no hepatomegaly Ext: Edema absent. Pulses 2+ and symmetric Skin: warm and dry Neuro:  CNs 2-12 intact, no focal abnormalities noted  EKG:  Old inferior infarct. Sinus rhythm. Otherwise unremarkable.       Signed, Illene Labrador III, MD 11/10/2013 4:37 PM

## 2014-01-05 ENCOUNTER — Other Ambulatory Visit: Payer: Self-pay | Admitting: Internal Medicine

## 2014-01-05 DIAGNOSIS — N641 Fat necrosis of breast: Secondary | ICD-10-CM

## 2014-01-27 ENCOUNTER — Ambulatory Visit
Admission: RE | Admit: 2014-01-27 | Discharge: 2014-01-27 | Disposition: A | Discharge status: Home / Self Care | Payer: Medicare Other | Source: Ambulatory Visit | Attending: Internal Medicine | Admitting: Internal Medicine

## 2014-01-27 ENCOUNTER — Other Ambulatory Visit: Payer: Self-pay | Admitting: Internal Medicine

## 2014-01-27 DIAGNOSIS — N6320 Unspecified lump in the left breast, unspecified quadrant: Secondary | ICD-10-CM

## 2014-01-27 DIAGNOSIS — N6310 Unspecified lump in the right breast, unspecified quadrant: Secondary | ICD-10-CM

## 2014-01-27 DIAGNOSIS — N641 Fat necrosis of breast: Secondary | ICD-10-CM

## 2014-10-10 ENCOUNTER — Telehealth: Payer: Self-pay

## 2014-10-10 DIAGNOSIS — I701 Atherosclerosis of renal artery: Secondary | ICD-10-CM

## 2014-10-10 NOTE — Telephone Encounter (Signed)
Pt aware that her pcp called the office and spoke with Dr.Smith. Adv her that they are recommending pt has a ran artery duplex to f/u on her renal artery stenosis. Pt also needs consult with Dr.Arida there after to f/u. Adv her a scheduler from our office will call her to schedule. Pt verbalized understanding.

## 2014-10-11 ENCOUNTER — Encounter: Payer: Self-pay | Admitting: Interventional Cardiology

## 2014-10-20 ENCOUNTER — Encounter (HOSPITAL_COMMUNITY): Payer: Medicare Other

## 2014-10-20 ENCOUNTER — Ambulatory Visit (HOSPITAL_COMMUNITY): Payer: Medicare Other | Attending: Interventional Cardiology

## 2014-10-20 DIAGNOSIS — I701 Atherosclerosis of renal artery: Secondary | ICD-10-CM | POA: Diagnosis not present

## 2014-10-20 DIAGNOSIS — I10 Essential (primary) hypertension: Secondary | ICD-10-CM

## 2014-10-31 ENCOUNTER — Institutional Professional Consult (permissible substitution): Payer: Medicare Other | Admitting: Cardiovascular Disease

## 2014-11-12 NOTE — Progress Notes (Signed)
Cardiology Office Note   Date:  11/14/2014   ID:  [name], DOB 28-Oct-1931, MRN [id number]  PCP:  Irven Shelling, MD  Cardiologist:  Sinclair Grooms, MD   Chief Complaint  Patient presents with  . Coronary Artery Disease  . Hypertension      History of Present Illness: [name] is a 81 y.o. female who presents for coronary artery disease with chronic right coronary total occlusion, history of renal artery stenosis, renal vascular hypertension with difficult to control blood pressure, multiple medication intolerances.  In June the patient awakened with chest discomfort. Her blood pressure was greater than 409 mmHg systolic. Nitroglycerin and clonidine relieves the discomfort. She has subsequently seen the primary physician, Dr. Lavone Orn. Adjustments in medications were made but blood pressure remains high.  A recent renal duplex did not demonstrate evidence of recurrent renal artery stenosis.    Past Medical History  Diagnosis Date  . CAD (coronary artery disease)   . Renal artery stenosis   . HTN (hypertension)   . Dyslipidemia   . DM (diabetes mellitus)   . GERD (gastroesophageal reflux disease)   . Osteoporosis     Past Surgical History  Procedure Laterality Date  . Right hip fx 2011    . Left wrist fx 2012    . Abdominal hysterectomy    . Cholecystectomy    . Bladder tac      x3  . Cardiac catheterization    . Neck sx    . Breast surgery      breast reduction 1985  . Left renal stent       Current Outpatient Prescriptions  Medication Sig Dispense Refill  . ACCU-CHEK COMPACT PLUS test strip Check blood sugar at bedtime as directed  6  . ACCU-CHEK SOFTCLIX LANCETS lancets Check blood sugar at bedtime as directed  5  . aspirin 325 MG tablet Take 325 mg by mouth at bedtime.    . betamethasone dipropionate (DIPROLENE) 0.05 % cream Apply 1 application topically daily as needed (irritated skin).    . carvedilol (COREG) 25 MG tablet Take  25 mg by mouth 2 (two) times daily with a meal.    . cholecalciferol (VITAMIN D) 1000 UNITS tablet Take 1,000 Units by mouth daily.    . cloNIDine (CATAPRES) 0.1 MG tablet Take 0.1 mg by mouth daily as needed (For systolic BP >811).    [name] Cod Liver Oil CAPS Take 1 capsule by mouth daily.    [name] esomeprazole (NEXIUM) 40 MG capsule Take 40 mg by mouth 2 (two) times daily.    . fexofenadine (ALLEGRA) 180 MG tablet Take 180 mg by mouth daily.    . meclizine (ANTIVERT) 25 MG tablet Take 25 mg by mouth 3 (three) times daily as needed for dizziness.     . Multiple Minerals-Vitamins (CITRACAL PLUS) TABS Take 3 tablets by mouth 2 (two) times daily.    . Multiple Vitamins-Minerals (PRESERVISION AREDS) CAPS Take 1 capsule by mouth daily.    . nitroGLYCERIN (NITROSTAT) 0.4 MG SL tablet Place 1 tablet (0.4 mg total) under the tongue every 5 (five) minutes as needed for chest pain. 25 tablet 5  . olmesartan (BENICAR) 20 MG tablet Take 20 mg by mouth 2 (two) times daily.     [name] PREMARIN vaginal cream Place 1-2 g vaginally 2 (two) times a week.  4  . simvastatin (ZOCOR) 20 MG tablet Take 20 mg by mouth daily.     [name]  triamterene-hydrochlorothiazide (MAXZIDE-25) 37.5-25 MG per tablet Take 1 tablet by mouth daily.    [name] VITAMIN E PO Take 1 capsule by mouth daily.     No current facility-administered medications for this visit.    Allergies:   Fentanyl; Morphine and related; Actamin; Actonel; Amlodipine; Avapro; Cardio complete; Cardizem; Ciprofloxacin; Codeine; Dilaudid; Forteo; Fosamax; Inapsine; Ivp dye; Lipitor; Lisinopril; Micardis; Septra; Shellfish allergy; Tekturna; Tricor; and Tussionex pennkinetic er    Social History:  The patient  reports that she has quit smoking. She has never used smokeless tobacco. She reports that she does not drink alcohol or use illicit drugs.   Family History:  The patient's family history includes Alzheimer's disease in her sister; Congestive Heart Failure in her father; Dementia  in her brother; Diabetes type II in her brother, mother, sister, and sister; Healthy in her sister and sister; Heart attack in her mother and sister; Heart disease in her brother, mother, and sister; Kidney failure in her sister and sister; Other in her brother; Pulmonary disease in her sister; Pulmonary embolism in her father; Stroke in her brother and brother.    ROS:  Please see the history of present illness.   Otherwise, review of systems are positive for irregular heartbeat, cough, and recent episodes of chest pain..   All other systems are reviewed and negative.    PHYSICAL EXAM: VS:  BP 198/66 mmHg  Pulse 66  Ht 5\' 1"  (1.549 m)  Wt 56.518 kg (124 lb 9.6 oz)  BMI 23.56 kg/m2 , BMI Body mass index is 23.56 kg/(m^2). GEN: Well nourished, well developed, in no acute distress HEENT: normal Neck: no JVD, carotid bruits, or masses Cardiac: RRR; no murmurs, rubs, or gallops,no edema  Respiratory:  clear to auscultation bilaterally, normal work of breathing GI: soft, nontender, nondistended, + BS MS: no deformity or atrophy Skin: warm and dry, no rash Neuro:  Strength and sensation are intact Psych: euthymic mood, full affect   EKG:  EKG is ordered today. The ekg ordered today demonstrates inferolateral Q waves. Normal sinus rhythm. Left atrial abnormality.   Recent Labs: No results found for requested labs within last 365 days.    Lipid Panel    Component Value Date/Time   CHOL  05/10/2008 0635    177        ATP III CLASSIFICATION:  <200     mg/dL   Desirable  200-239  mg/dL   Borderline High  >=240    mg/dL   High          TRIG 546* 05/10/2008 0635   HDL 31* 05/10/2008 0635   CHOLHDL 5.7 05/10/2008 0635   VLDL UNABLE TO CALCULATE IF TRIGLYCERIDE OVER 400 mg/dL 05/10/2008 0635   LDLCALC  05/10/2008 0635    UNABLE TO CALCULATE IF TRIGLYCERIDE OVER 400 mg/dL        Total Cholesterol/HDL:CHD Risk Coronary Heart Disease Risk Table                     Men   Women  1/2  Average Risk   3.4   3.3  Average Risk       5.0   4.4  2 X Average Risk   9.6   7.1  3 X Average Risk  23.4   11.0        Use the calculated Patient Ratio above and the CHD Risk Table to determine the patient's CHD Risk.        ATP III  CLASSIFICATION (LDL):  <100     mg/dL   Optimal  100-129  mg/dL   Near or Above                    Optimal  130-159  mg/dL   Borderline  160-189  mg/dL   High  >190     mg/dL   Very High      Wt Readings from Last 3 Encounters:  11/14/14 56.518 kg (124 lb 9.6 oz)  11/10/13 55.112 kg (121 lb 8 oz)  08/21/12 57.516 kg (126 lb 12.8 oz)      Other studies Reviewed: Additional studies/ records that were reviewed today include: Reviewed records.[place].. Review of the above records demonstrates: We'll obtain and review renal duplex.   ASSESSMENT AND PLAN:  1. Essential hypertension Poor control. Today will increase Maxzide to a full dose rather than a half tablet. She has previously had hyponatremia associated.  2. Coronary artery disease involving native coronary artery of native heart without angina pectoris Angina associated with elevated blood pressure  3. Hyperlipidemia On therapy and followed by primary care  4. Renal artery stenosis Recent duplex was unremarkable. If blood pressure remains difficult to control, will probably need to do MRA or CT angios.  5. Cough Uncertain etiology. A BNP will be done to rule out the possibility of congestion  Current medicines are reviewed at length with the patient today.  The patient does not have concerns regarding medicines.  The following changes have been made:  Increase Maxzide to one tablet per day (37.5/25 mg tablet). A basic metabolic panel will be obtained in 7-10 days.  Labs/ tests ordered today include:   Orders Placed This Encounter  Procedures  . Basic metabolic panel  . EKG 12-Lead   Blood pressure clinic in 3-4 weeks. Laboratory data will be obtained as stated  above.  Disposition:   FU with HS in 6 months  Signed, Sinclair Grooms, MD  11/14/2014 3:47 PM    [address], [place], [place]  [address] Phone: [phone]; Fax: [phone]

## 2014-11-14 ENCOUNTER — Encounter: Payer: Self-pay | Admitting: Interventional Cardiology

## 2014-11-14 ENCOUNTER — Ambulatory Visit (INDEPENDENT_AMBULATORY_CARE_PROVIDER_SITE_OTHER): Payer: Medicare Other | Admitting: Interventional Cardiology

## 2014-11-14 VITALS — BP 198/66 | HR 66 | Ht 61.0 in | Wt 124.6 lb

## 2014-11-14 DIAGNOSIS — I10 Essential (primary) hypertension: Secondary | ICD-10-CM | POA: Diagnosis not present

## 2014-11-14 DIAGNOSIS — R059 Cough, unspecified: Secondary | ICD-10-CM

## 2014-11-14 DIAGNOSIS — I2510 Atherosclerotic heart disease of native coronary artery without angina pectoris: Secondary | ICD-10-CM | POA: Diagnosis not present

## 2014-11-14 DIAGNOSIS — E785 Hyperlipidemia, unspecified: Secondary | ICD-10-CM | POA: Diagnosis not present

## 2014-11-14 DIAGNOSIS — R05 Cough: Secondary | ICD-10-CM

## 2014-11-14 DIAGNOSIS — I701 Atherosclerosis of renal artery: Secondary | ICD-10-CM | POA: Diagnosis not present

## 2014-11-14 NOTE — Patient Instructions (Signed)
Medication Instructions:  Your physician has recommended you make the following change in your medication:  INCREASE Maxzide to 1 tablet daily   Labwork: Your physician recommends that you return for lab work in: 10 days (bmet)  Testing/Procedures: None   Follow-Up: You have been referred to BP clinic in 1 month  Your physician wants you to follow-up in: 6 months with Dr.Smith You will receive a reminder letter in the mail two months in advance. If you don't receive a letter, please call our office to schedule the follow-up appointment.    Any Other Special Instructions Will Be Listed Below (If Applicable).

## 2014-11-15 ENCOUNTER — Telehealth: Payer: Self-pay

## 2014-11-15 MED ORDER — NIFEDIPINE ER 30 MG PO TB24: 30.0000 mg | ORAL_TABLET | Freq: Every day | ORAL | Status: DC

## 2014-11-15 NOTE — Telephone Encounter (Signed)
Pt aware. Dr.Smith has reviewed her recent labs ordered by her pcp. Dr.Smith's recommendations are as followed: After reviewing lab work performed by Dr. Laurann Montana from 11/09/2014, we should leave the Maxide at one half tablet per day. We should start Procardia XL or Adalat cc 30 mg per day. An Rx for Adalat cc 30mg  qd has been sent to pt pharmacy. Pt verbalized understanding.

## 2014-11-15 NOTE — Telephone Encounter (Signed)
-----   Message from Belva Crome, MD sent at 11/14/2014  5:44 PM EDT ----- After reviewing lab work performed by Dr. Laurann Montana from 11/09/2014, we should leave the Maxide at one half tablet per day. We should start Procardia XL or Adalat cc 30 mg per day.

## 2014-11-17 ENCOUNTER — Telehealth: Payer: Self-pay | Admitting: Interventional Cardiology

## 2014-11-17 NOTE — Telephone Encounter (Signed)
New message     Pt c/o medication issue:  1. Name of Medication: nifedipine  2. How are you currently taking this medication (dosage and times per day)? 30mg   3. Are you having a reaction (difficulty breathing--STAT)? no 4. What is your medication issue? Pt took 1 pill and got very sick---vomiting and bad headache.  Bp did not change all day long.  She has not taken any more.  Please advise

## 2014-11-17 NOTE — Telephone Encounter (Signed)
Spoke with patient who states she took 1 Procardia XL pill yesterday as prescribed by Dr. Tamala Julian on 8/2.  She reports it gave her a terrible headache, dizziness, and she later vomited.  Reports her BP did not improve after taking the 1 pill.  States headache gone today but head feels heavy; reports BP when she woke up today 191/70.  She took Coreg 25 mg, Benicar 20 mg, and 1/2 Maxzide 37.5/25 mg and 1 hour later BP was 141/56.  She reports BP remains normal until 10:00 pm and then it spikes again.  States she has Clonidine 0.1 mg which she is supposed to take if systolic BP > 505 mmHg and/or diastolic >697 mmHg.  Wants to know if she can take 1 of these at 10 pm?  Also questions whether Simvastatin could cause decreased urination and could it be the reason for the elevated BUN.  Asks if she can switch to Crestor 10 mg.  I advised that patient should continue to hold Procardia and I will send message to  Dr. Tamala Julian who is working in the hospital today.  She is aware we will call her back with his advice today or Monday, depending on when he responds.  She verbalized understanding and agreement with plan of care and requests call back on mobile number listed in chart.

## 2014-11-20 NOTE — Telephone Encounter (Signed)
Spoke with patient and reviewed Dr. Thompson Caul advice to start Hydralazine and patient states Dr. Laurann Montana prescribed it in the past and she did not tolerate.  She states it gave her diarrhea and made her abdomen blow up like a balloon.  I advised her that I will send message back to Dr. Tamala Julian for a different agent. Dr. Tamala Julian, please also advise if patient may take an additional Clonidine 0.1 mg at 10:00 pm and does she need to keep lab appointment to check for elevated BUN even though she could not tolerate Procardia.

## 2014-11-20 NOTE — Telephone Encounter (Signed)
Start hydralazine 25 mg every 8 hours and let her know we will titrate this to get good BP control.

## 2014-11-21 NOTE — Telephone Encounter (Signed)
I don't have any further suggestions. She should f/u with Dr. Laurann Montana.

## 2014-11-22 NOTE — Telephone Encounter (Signed)
Called to give pt Dr.Smith's response below. [email or web address] 

## 2014-11-22 NOTE — Telephone Encounter (Signed)
Pt aware of Dr.Smith's response. I don't have any further suggestions. She should f/u with Dr. Laurann Montana. Pt will come to our office for lab on 8/12 as planned. Pt verbalized understanding.

## 2014-11-24 ENCOUNTER — Other Ambulatory Visit: Payer: Self-pay | Admitting: *Deleted

## 2014-11-24 ENCOUNTER — Other Ambulatory Visit (INDEPENDENT_AMBULATORY_CARE_PROVIDER_SITE_OTHER): Payer: Medicare Other | Admitting: *Deleted

## 2014-11-24 DIAGNOSIS — I10 Essential (primary) hypertension: Secondary | ICD-10-CM

## 2014-11-24 DIAGNOSIS — R05 Cough: Secondary | ICD-10-CM

## 2014-11-24 DIAGNOSIS — E785 Hyperlipidemia, unspecified: Secondary | ICD-10-CM

## 2014-11-24 DIAGNOSIS — R059 Cough, unspecified: Secondary | ICD-10-CM

## 2014-11-24 DIAGNOSIS — I2510 Atherosclerotic heart disease of native coronary artery without angina pectoris: Secondary | ICD-10-CM

## 2014-11-24 LAB — BASIC METABOLIC PANEL
BUN: 43 mg/dL — ABNORMAL HIGH (ref 6–23)
CALCIUM: 9.3 mg/dL (ref 8.4–10.5)
CO2: 29 mEq/L (ref 19–32)
Chloride: 98 mEq/L (ref 96–112)
Creatinine, Ser: 1.79 mg/dL — ABNORMAL HIGH (ref 0.40–1.20)
GFR: 28.9 mL/min — ABNORMAL LOW (ref >60.00)
GLUCOSE: 211 mg/dL — AB (ref 70–99)
Potassium: 4.3 mEq/L (ref 3.5–5.1)
Sodium: 134 mEq/L — ABNORMAL LOW (ref 135–145)

## 2014-11-24 NOTE — Addendum Note (Signed)
Addended by: Eulis Foster on: 11/24/2014 01:07 PM   Modules accepted: Orders

## 2014-11-27 ENCOUNTER — Telehealth: Payer: Self-pay

## 2014-11-27 NOTE — Telephone Encounter (Signed)
-----   Message from Belva Crome, MD sent at 11/26/2014  2:29 PM EDT ----- Regarding: Possible RAS See if Mrs. Sliva will see Dr. [name] to have angiography done to r/o recurrent Renal artery stenosis. She has decrease in kidney function and prior h/o RA stent. Recent RA doppler was unremarkable.

## 2014-11-29 NOTE — Telephone Encounter (Signed)
Pt aware of Dr.Smith, recommendation below. Pt would prefer to be seen at this location. Adv pt I will fwd a message to pcc to call her to scheduled an appt with Dr.Arida. Pt verbalized understanding.

## 2014-12-12 ENCOUNTER — Ambulatory Visit (INDEPENDENT_AMBULATORY_CARE_PROVIDER_SITE_OTHER): Payer: Medicare Other | Admitting: Cardiovascular Disease

## 2014-12-12 ENCOUNTER — Encounter: Payer: Self-pay | Admitting: *Deleted

## 2014-12-12 VITALS — BP 152/56 | HR 66 | Wt 126.0 lb

## 2014-12-12 DIAGNOSIS — I701 Atherosclerosis of renal artery: Secondary | ICD-10-CM

## 2014-12-12 DIAGNOSIS — I10 Essential (primary) hypertension: Secondary | ICD-10-CM

## 2014-12-12 LAB — CBC WITH DIFFERENTIAL/PLATELET
BASOS PCT: 0.4 % (ref 0.0–3.0)
Basophils Absolute: 0 10*3/uL (ref 0.0–0.1)
EOS PCT: 3.2 % (ref 0.0–5.0)
Eosinophils Absolute: 0.2 10*3/uL (ref 0.0–0.7)
HCT: 36.6 % (ref 36.0–46.0)
Hemoglobin: 12.4 g/dL (ref 12.0–15.0)
LYMPHS ABS: 1.8 10*3/uL (ref 0.7–4.0)
Lymphocytes Relative: 27.1 % (ref 12.0–46.0)
MCHC: 34 g/dL (ref 30.0–36.0)
MCV: 86.6 fl (ref 78.0–100.0)
Monocytes Absolute: 0.5 10*3/uL (ref 0.1–1.0)
Monocytes Relative: 7 % (ref 3.0–12.0)
NEUTROS PCT: 62.3 % (ref 43.0–77.0)
Neutro Abs: 4.2 10*3/uL (ref 1.4–7.7)
PLATELETS: 234 10*3/uL (ref 150.0–400.0)
RBC: 4.23 Mil/uL (ref 3.87–5.11)
RDW: 13.1 % (ref 11.5–15.5)
WBC: 6.7 10*3/uL (ref 4.0–10.5)

## 2014-12-12 LAB — PROTIME-INR
INR: 1 ratio (ref 0.8–1.0)
Prothrombin Time: 11 s (ref 9.6–13.1)

## 2014-12-12 LAB — BASIC METABOLIC PANEL
BUN: 38 mg/dL — ABNORMAL HIGH (ref 6–23)
CHLORIDE: 96 meq/L (ref 96–112)
CO2: 30 mEq/L (ref 19–32)
Calcium: 9.6 mg/dL (ref 8.4–10.5)
Creatinine, Ser: 1.61 mg/dL — ABNORMAL HIGH (ref 0.40–1.20)
GFR: 32.66 mL/min — ABNORMAL LOW (ref >60.00)
GLUCOSE: 172 mg/dL — AB (ref 70–99)
Potassium: 4.3 mEq/L (ref 3.5–5.1)
Sodium: 132 mEq/L — ABNORMAL LOW (ref 135–145)

## 2014-12-12 MED ORDER — CLONIDINE HCL 0.1 MG PO TABS: 0.1000 mg | ORAL_TABLET | Freq: Every day | ORAL | Status: DC | PRN

## 2014-12-12 MED ORDER — PREDNISONE 20 MG PO TABS
ORAL_TABLET | ORAL | Status: DC
Start: 2014-12-12 — End: 2014-12-20

## 2014-12-12 NOTE — Progress Notes (Signed)
Cardiology Office Note   Date:  12/12/2014   ID:  [name], DOB [birth year], MRN [id number]  PCP:  Irven Shelling, MD  Cardiologist:  [name], MD   No chief complaint on file.     History of Present Illness: [name] is a 81 y.o. female who was referred by Dr. Tamala Julian for evaluation of refractory hypertension and renal artery stenosis. She has known history of  coronary artery disease with chronic right coronary total occlusion, history of renal artery stenosis, renal vascular hypertension with difficult to control blood pressure, multiple medication intolerances. She had left renal artery stent placement in 2012 by Dr. Irish Lack. She reports reasonable blood pressure control after that up until recently.  In June the patient awakened with chest discomfort. Her blood pressure was greater than 841 mmHg systolic. Nitroglycerin and clonidine relieved the discomfort. She has subsequently seen the primary physician, Dr. Lavone Orn. Adjustments in medications were made but blood pressure remained high. The dose of Maxide was doubled but the patient developed hyponatremia. Nifedipine was added but this was not tolerated due to headache. The patient did not tolerate hydralazine in the past due to GI symptoms. She has multiple allergies and intolerance. Blood pressure has not been well controlled at all since June. Also over the last year, that has been gradual decline in renal function. She had recent renal artery duplex that was a technically challenging study due to bowel gas. The renal arteries were not well visualized. No significant high velocity was noted but obviously the study was very suboptimal.   Past Medical History  Diagnosis Date  . CAD (coronary artery disease)   . Renal artery stenosis   . HTN (hypertension)   . Dyslipidemia   . DM (diabetes mellitus)   . GERD (gastroesophageal reflux disease)   . Osteoporosis     Past Surgical History  Procedure  Laterality Date  . Right hip fx 2011    . Left wrist fx 2012    . Abdominal hysterectomy    . Cholecystectomy    . Bladder tac      x3  . Cardiac catheterization    . Neck sx    . Breast surgery      breast reduction 1985  . Left renal stent       Current Outpatient Prescriptions  Medication Sig Dispense Refill  . ACCU-CHEK COMPACT PLUS test strip Check blood sugar at bedtime as directed  6  . ACCU-CHEK SOFTCLIX LANCETS lancets Check blood sugar at bedtime as directed  5  . aspirin 325 MG tablet Take 325 mg by mouth at bedtime.    [name] azelastine (ASTELIN) 0.1 % nasal spray SPRAY 1-2 TIMES IN EACH NOSTRIL TWICE A DAY AS NEEDED  11  . betamethasone dipropionate (DIPROLENE) 0.05 % cream Apply 1 application topically daily as needed (irritated skin).    . carvedilol (COREG) 25 MG tablet Take 25 mg by mouth 2 (two) times daily with a meal.    . cholecalciferol (VITAMIN D) 1000 UNITS tablet Take 1,000 Units by mouth daily.    [name] Cod Liver Oil CAPS Take 1 capsule by mouth daily.    [name] esomeprazole (NEXIUM) 40 MG capsule Take 40 mg by mouth 2 (two) times daily.    . fexofenadine (ALLEGRA) 180 MG tablet Take 180 mg by mouth daily.    . meclizine (ANTIVERT) 25 MG tablet Take 25 mg by mouth 3 (three) times daily as needed for dizziness.     [name]  Multiple Minerals-Vitamins (CITRACAL PLUS) TABS Take 3 tablets by mouth 2 (two) times daily.    . Multiple Vitamins-Minerals (PRESERVISION AREDS) CAPS Take 1 capsule by mouth daily.    . nitroGLYCERIN (NITROSTAT) 0.4 MG SL tablet Place 1 tablet (0.4 mg total) under the tongue every 5 (five) minutes as needed for chest pain. 25 tablet 5  . olmesartan (BENICAR) 20 MG tablet Take 20 mg by mouth 2 (two) times daily.     [name] PREMARIN vaginal cream Place 1-2 g vaginally 2 (two) times a week.  4  . simvastatin (ZOCOR) 20 MG tablet Take 20 mg by mouth daily.     [name] triamterene-hydrochlorothiazide (MAXZIDE-25) 37.5-25 MG per tablet Take 0.5 tablets by mouth daily.    [name]  VITAMIN E PO Take 1 capsule by mouth daily.    . cloNIDine (CATAPRES) 0.1 MG tablet Take 0.1 mg by mouth daily as needed (For systolic BP >222).     No current facility-administered medications for this visit.    Allergies:   Fentanyl; Morphine and related; Hydralazine; Actamin; Actonel; Amlodipine; Avapro; Cardio complete; Cardizem; Ciprofloxacin; Codeine; Dilaudid; Forteo; Fosamax; Inapsine; Ivp dye; Lipitor; Lisinopril; Micardis; Septra; Shellfish allergy; Tekturna; Tricor; and Tussionex pennkinetic er    Social History:  The patient  reports that she has quit smoking. She has never used smokeless tobacco. She reports that she does not drink alcohol or use illicit drugs.   Family History:  The patient's family history includes Alzheimer's disease in her sister; Congestive Heart Failure in her father; Dementia in her brother; Diabetes type II in her brother, mother, sister, and sister; Healthy in her sister and sister; Heart attack in her mother and sister; Heart disease in her brother, mother, and sister; Kidney failure in her sister and sister; Other in her brother; Pulmonary disease in her sister; Pulmonary embolism in her father; Stroke in her brother and brother.    ROS:  Please see the history of present illness.   Otherwise, review of systems are positive for irregular heartbeat, cough, and recent episodes of chest pain..   All other systems are reviewed and negative.    PHYSICAL EXAM: VS:  BP 152/56 mmHg  Pulse 66  Wt 126 lb (57.153 kg)  SpO2 98% , BMI Body mass index is 23.82 kg/(m^2). GEN: Well nourished, well developed, in no acute distress HEENT: normal Neck: no JVD, carotid bruits, or masses Cardiac: RRR;  rubs, or gallops,no edema and there is a 3/6 systolic ejection murmur in the aortic area. Respiratory:  clear to auscultation bilaterally, normal work of breathing GI: soft, nontender, nondistended, + BS MS: no deformity or atrophy Skin: warm and dry, no rash Neuro:   Strength and sensation are intact Psych: euthymic mood, full affect Vascular: Radial pulses are normal. Femoral pulses are normal. Distal pulses are normal.    Recent Labs: 11/24/2014: BUN 43*; Creatinine, Ser 1.79*; Potassium 4.3; Sodium 134*    Lipid Panel    Component Value Date/Time   CHOL  05/10/2008 0635    177        ATP III CLASSIFICATION:  <200     mg/dL   Desirable  200-239  mg/dL   Borderline High  >=240    mg/dL   High          TRIG 546* 05/10/2008 0635   HDL 31* 05/10/2008 0635   CHOLHDL 5.7 05/10/2008 0635   VLDL UNABLE TO CALCULATE IF TRIGLYCERIDE OVER 400 mg/dL 05/10/2008 0635   LDLCALC  05/10/2008  9476    UNABLE TO CALCULATE IF TRIGLYCERIDE OVER 400 mg/dL        Total Cholesterol/HDL:CHD Risk Coronary Heart Disease Risk Table                     Men   Women  1/2 Average Risk   3.4   3.3  Average Risk       5.0   4.4  2 X Average Risk   9.6   7.1  3 X Average Risk  23.4   11.0        Use the calculated Patient Ratio above and the CHD Risk Table to determine the patient's CHD Risk.        ATP III CLASSIFICATION (LDL):  <100     mg/dL   Optimal  100-129  mg/dL   Near or Above                    Optimal  130-159  mg/dL   Borderline  160-189  mg/dL   High  >190     mg/dL   Very High      Wt Readings from Last 3 Encounters:  12/12/14 126 lb (57.153 kg)  11/14/14 124 lb 9.6 oz (56.518 kg)  11/10/13 121 lb 8 oz (55.112 kg)         ASSESSMENT AND PLAN:  1. Renovascular hypertension with chronic kidney disease: Blood pressure is still poorly controlled in spite of multiple medications including a diaphoretic. The blood pressure became noticeably uncontrolled since June and over the last year, the patient had worsening chronic kidney disease. All of this is highly suggestive of progressive renal artery stenosis as the culprit for current presentation. The recent renal artery duplex was very challenging and of very suboptimal quality. Thus, I think  the best option is to proceed with renal angiography with CO2 and possible renal artery stenting. I had a prolonged discussion with the patient about risks, benefits and alternatives. I also explained to her the risk of contrast-induced nephropathy. She will be treated with redness on for contrast allergy. She also has multiple allergies including opioids.  She has tolerated Versed in the past.   2. Coronary artery disease involving native coronary artery of native heart without angina pectoris Angina associated with elevated blood pressure  3. Hyperlipidemia On therapy and followed by primary care    Signed,  [name], MD  12/12/2014 9:47 AM    [place]

## 2014-12-12 NOTE — Patient Instructions (Signed)
Medication Instructions:  Your physician recommends that you continue on your current medications as directed. Please refer to the Current Medication list given to you today. See renal angiogram instruction sheet for instructions regarding prednisone prior to procedure.   Labwork: Lab work to be done today--BMP, CBC, PT  Testing/Procedures: Your physician has requested that you have a renal artery angiogram. This exam is performed at the hospital. During this exam IV contrast is used to look at renal arterial blood flow. Please review the information sheet given for details.   Follow-Up: Your physician recommends that you schedule a follow-up appointment 3 weeks--Scheduled for September 20,2016 at 10:30    Any Other Special Instructions Will Be Listed Below (If Applicable).

## 2014-12-14 HISTORY — PX: OTHER SURGICAL HISTORY: SHX169

## 2014-12-20 ENCOUNTER — Encounter (HOSPITAL_COMMUNITY)
Admission: RE | Disposition: A | Discharge status: Home / Self Care | Payer: Self-pay | Source: Ambulatory Visit | Attending: Cardiovascular Disease

## 2014-12-20 ENCOUNTER — Ambulatory Visit (HOSPITAL_COMMUNITY)
Admission: RE | Admit: 2014-12-20 | Discharge: 2014-12-20 | Disposition: A | Discharge status: Home / Self Care | Payer: Medicare Other | Source: Ambulatory Visit | Attending: Cardiovascular Disease | Admitting: Cardiovascular Disease

## 2014-12-20 DIAGNOSIS — E871 Hypo-osmolality and hyponatremia: Secondary | ICD-10-CM | POA: Diagnosis not present

## 2014-12-20 DIAGNOSIS — Z7982 Long term (current) use of aspirin: Secondary | ICD-10-CM | POA: Insufficient documentation

## 2014-12-20 DIAGNOSIS — Z87891 Personal history of nicotine dependence: Secondary | ICD-10-CM | POA: Insufficient documentation

## 2014-12-20 DIAGNOSIS — I701 Atherosclerosis of renal artery: Secondary | ICD-10-CM | POA: Diagnosis present

## 2014-12-20 DIAGNOSIS — I2510 Atherosclerotic heart disease of native coronary artery without angina pectoris: Secondary | ICD-10-CM | POA: Diagnosis not present

## 2014-12-20 DIAGNOSIS — E119 Type 2 diabetes mellitus without complications: Secondary | ICD-10-CM | POA: Diagnosis not present

## 2014-12-20 DIAGNOSIS — I2582 Chronic total occlusion of coronary artery: Secondary | ICD-10-CM | POA: Diagnosis not present

## 2014-12-20 DIAGNOSIS — I209 Angina pectoris, unspecified: Secondary | ICD-10-CM | POA: Insufficient documentation

## 2014-12-20 DIAGNOSIS — E785 Hyperlipidemia, unspecified: Secondary | ICD-10-CM | POA: Diagnosis not present

## 2014-12-20 DIAGNOSIS — I10 Essential (primary) hypertension: Secondary | ICD-10-CM | POA: Diagnosis not present

## 2014-12-20 DIAGNOSIS — I70212 Atherosclerosis of native arteries of extremities with intermittent claudication, left leg: Secondary | ICD-10-CM | POA: Diagnosis not present

## 2014-12-20 HISTORY — PX: PERIPHERAL VASCULAR CATHETERIZATION: SHX172C

## 2014-12-20 LAB — GLUCOSE, CAPILLARY
GLUCOSE-CAPILLARY: 197 mg/dL — AB (ref 65–99)
Glucose-Capillary: 204 mg/dL — ABNORMAL HIGH (ref 65–99)

## 2014-12-20 SURGERY — RENAL ANGIOGRAPHY
Anesthesia: LOCAL | Laterality: Left

## 2014-12-20 MED ORDER — FAMOTIDINE IN NACL 20-0.9 MG/50ML-% IV SOLN: 20.0000 mg | Freq: Once | INTRAVENOUS | Status: DC

## 2014-12-20 MED ORDER — HEPARIN (PORCINE) IN NACL 2-0.9 UNIT/ML-% IJ SOLN
INTRAMUSCULAR | Status: AC
  Filled 2014-12-20: qty 1000

## 2014-12-20 MED ORDER — DIPHENHYDRAMINE HCL 50 MG/ML IJ SOLN
25.0000 mg | Freq: Once | INTRAMUSCULAR | Status: AC
  Administered 2014-12-20: 25 mg via INTRAVENOUS

## 2014-12-20 MED ORDER — HEPARIN (PORCINE) IN NACL 2-0.9 UNIT/ML-% IJ SOLN
INTRAMUSCULAR | Status: DC | PRN
  Administered 2014-12-20: 18 mL

## 2014-12-20 MED ORDER — DIPHENHYDRAMINE HCL 50 MG/ML IJ SOLN
INTRAMUSCULAR | Status: AC
  Filled 2014-12-20: qty 1

## 2014-12-20 MED ORDER — SODIUM CHLORIDE 0.9 % IV SOLN
250.0000 mL | INTRAVENOUS | Status: DC | PRN
Start: 2014-12-20 — End: 2014-12-20

## 2014-12-20 MED ORDER — SODIUM CHLORIDE 0.9 % WEIGHT BASED INFUSION
1.0000 mL/kg/h | INTRAVENOUS | Status: DC
  Administered 2014-12-20: 1 mL/kg/h via INTRAVENOUS

## 2014-12-20 MED ORDER — MIDAZOLAM HCL 2 MG/2ML IJ SOLN
INTRAMUSCULAR | Status: DC | PRN
  Administered 2014-12-20: 1 mg via INTRAVENOUS

## 2014-12-20 MED ORDER — SODIUM CHLORIDE 0.9 % IJ SOLN: 3.0000 mL | INTRAMUSCULAR | Status: DC | PRN

## 2014-12-20 MED ORDER — IODIXANOL 320 MG/ML IV SOLN
INTRAVENOUS | Status: DC | PRN
Start: 2014-12-20 — End: 2014-12-20
  Administered 2014-12-20: 23 mL via INTRAVENOUS

## 2014-12-20 MED ORDER — SODIUM CHLORIDE 0.9 % IV SOLN: 250.0000 mL | INTRAVENOUS | Status: DC | PRN

## 2014-12-20 MED ORDER — ASPIRIN 81 MG PO CHEW
81.0000 mg | CHEWABLE_TABLET | ORAL | Status: AC
  Administered 2014-12-20: 81 mg via ORAL

## 2014-12-20 MED ORDER — SODIUM CHLORIDE 0.9 % IV SOLN: INTRAVENOUS | Status: AC

## 2014-12-20 MED ORDER — LABETALOL HCL 5 MG/ML IV SOLN
INTRAVENOUS | Status: AC
  Filled 2014-12-20: qty 4

## 2014-12-20 MED ORDER — MIDAZOLAM HCL 2 MG/2ML IJ SOLN
INTRAMUSCULAR | Status: AC
  Filled 2014-12-20: qty 4

## 2014-12-20 MED ORDER — SODIUM CHLORIDE 0.9 % IJ SOLN
3.0000 mL | Freq: Two times a day (BID) | INTRAMUSCULAR | Status: DC
Start: 2014-12-20 — End: 2014-12-20

## 2014-12-20 MED ORDER — ASPIRIN 81 MG PO CHEW
CHEWABLE_TABLET | ORAL | Status: AC
  Administered 2014-12-20: 81 mg via ORAL
  Filled 2014-12-20: qty 1

## 2014-12-20 MED ORDER — SODIUM CHLORIDE 0.9 % WEIGHT BASED INFUSION
3.0000 mL/kg/h | INTRAVENOUS | Status: AC
  Administered 2014-12-20: 3 mL/kg/h via INTRAVENOUS

## 2014-12-20 MED ORDER — LABETALOL HCL 5 MG/ML IV SOLN
INTRAVENOUS | Status: DC | PRN
  Administered 2014-12-20: 20 mg via INTRAVENOUS

## 2014-12-20 MED ORDER — SODIUM CHLORIDE 0.9 % IJ SOLN: 3.0000 mL | Freq: Two times a day (BID) | INTRAMUSCULAR | Status: DC

## 2014-12-20 MED ORDER — LIDOCAINE HCL (PF) 1 % IJ SOLN
INTRAMUSCULAR | Status: AC
  Filled 2014-12-20: qty 30

## 2014-12-20 SURGICAL SUPPLY — 13 items
CATH ANGIO 5F PIGTAIL 65CM (CATHETERS) ×2 IMPLANT
FILTER CO2 0.2 MICRON (VASCULAR PRODUCTS) ×2 IMPLANT
GUIDE CATH VISTA RDC 6F (CATHETERS) ×2 IMPLANT
KIT ENCORE 26 ADVANTAGE (KITS) ×2 IMPLANT
KIT PV (KITS) ×2 IMPLANT
RESERVOIR CO2 (VASCULAR PRODUCTS) ×2 IMPLANT
SET FLUSH CO2 (MISCELLANEOUS) ×2 IMPLANT
SHEATH PINNACLE 6F 10CM (SHEATH) ×2 IMPLANT
STOPCOCK MORSE 400PSI 3WAY (MISCELLANEOUS) ×2 IMPLANT
TRANSDUCER W/STOPCOCK (MISCELLANEOUS) ×2 IMPLANT
TRAY PV CATH (CUSTOM PROCEDURE TRAY) ×2 IMPLANT
TUBING CIL FLEX 10 FLL-RA (TUBING) ×2 IMPLANT
WIRE HITORQ VERSACORE ST 145CM (WIRE) ×2 IMPLANT

## 2014-12-20 NOTE — H&P (View-Only) (Signed)
Cardiology Office Note   Date:  12/12/2014   ID:  [name], DOB [birth year], MRN [id number]  PCP:  Irven Shelling, MD  Cardiologist:  [name], MD   No chief complaint on file.     History of Present Illness: [name] is a 81 y.o. female who was referred by Dr. Tamala Julian for evaluation of refractory hypertension and renal artery stenosis. She has known history of  coronary artery disease with chronic right coronary total occlusion, history of renal artery stenosis, renal vascular hypertension with difficult to control blood pressure, multiple medication intolerances. She had left renal artery stent placement in 2012 by Dr. Irish Lack. She reports reasonable blood pressure control after that up until recently.  In June the patient awakened with chest discomfort. Her blood pressure was greater than 378 mmHg systolic. Nitroglycerin and clonidine relieved the discomfort. She has subsequently seen the primary physician, Dr. Lavone Orn. Adjustments in medications were made but blood pressure remained high. The dose of Maxide was doubled but the patient developed hyponatremia. Nifedipine was added but this was not tolerated due to headache. The patient did not tolerate hydralazine in the past due to GI symptoms. She has multiple allergies and intolerance. Blood pressure has not been well controlled at all since June. Also over the last year, that has been gradual decline in renal function. She had recent renal artery duplex that was a technically challenging study due to bowel gas. The renal arteries were not well visualized. No significant high velocity was noted but obviously the study was very suboptimal.   Past Medical History  Diagnosis Date  . CAD (coronary artery disease)   . Renal artery stenosis   . HTN (hypertension)   . Dyslipidemia   . DM (diabetes mellitus)   . GERD (gastroesophageal reflux disease)   . Osteoporosis     Past Surgical History  Procedure  Laterality Date  . Right hip fx 2011    . Left wrist fx 2012    . Abdominal hysterectomy    . Cholecystectomy    . Bladder tac      x3  . Cardiac catheterization    . Neck sx    . Breast surgery      breast reduction 1985  . Left renal stent       Current Outpatient Prescriptions  Medication Sig Dispense Refill  . ACCU-CHEK COMPACT PLUS test strip Check blood sugar at bedtime as directed  6  . ACCU-CHEK SOFTCLIX LANCETS lancets Check blood sugar at bedtime as directed  5  . aspirin 325 MG tablet Take 325 mg by mouth at bedtime.    [name] azelastine (ASTELIN) 0.1 % nasal spray SPRAY 1-2 TIMES IN EACH NOSTRIL TWICE A DAY AS NEEDED  11  . betamethasone dipropionate (DIPROLENE) 0.05 % cream Apply 1 application topically daily as needed (irritated skin).    . carvedilol (COREG) 25 MG tablet Take 25 mg by mouth 2 (two) times daily with a meal.    . cholecalciferol (VITAMIN D) 1000 UNITS tablet Take 1,000 Units by mouth daily.    [name] Cod Liver Oil CAPS Take 1 capsule by mouth daily.    [name] esomeprazole (NEXIUM) 40 MG capsule Take 40 mg by mouth 2 (two) times daily.    . fexofenadine (ALLEGRA) 180 MG tablet Take 180 mg by mouth daily.    . meclizine (ANTIVERT) 25 MG tablet Take 25 mg by mouth 3 (three) times daily as needed for dizziness.     [name]  Multiple Minerals-Vitamins (CITRACAL PLUS) TABS Take 3 tablets by mouth 2 (two) times daily.    . Multiple Vitamins-Minerals (PRESERVISION AREDS) CAPS Take 1 capsule by mouth daily.    . nitroGLYCERIN (NITROSTAT) 0.4 MG SL tablet Place 1 tablet (0.4 mg total) under the tongue every 5 (five) minutes as needed for chest pain. 25 tablet 5  . olmesartan (BENICAR) 20 MG tablet Take 20 mg by mouth 2 (two) times daily.     [name] PREMARIN vaginal cream Place 1-2 g vaginally 2 (two) times a week.  4  . simvastatin (ZOCOR) 20 MG tablet Take 20 mg by mouth daily.     [name] triamterene-hydrochlorothiazide (MAXZIDE-25) 37.5-25 MG per tablet Take 0.5 tablets by mouth daily.    [name]  VITAMIN E PO Take 1 capsule by mouth daily.    . cloNIDine (CATAPRES) 0.1 MG tablet Take 0.1 mg by mouth daily as needed (For systolic BP >390).     No current facility-administered medications for this visit.    Allergies:   Fentanyl; Morphine and related; Hydralazine; Actamin; Actonel; Amlodipine; Avapro; Cardio complete; Cardizem; Ciprofloxacin; Codeine; Dilaudid; Forteo; Fosamax; Inapsine; Ivp dye; Lipitor; Lisinopril; Micardis; Septra; Shellfish allergy; Tekturna; Tricor; and Tussionex pennkinetic er    Social History:  The patient  reports that she has quit smoking. She has never used smokeless tobacco. She reports that she does not drink alcohol or use illicit drugs.   Family History:  The patient's family history includes Alzheimer's disease in her sister; Congestive Heart Failure in her father; Dementia in her brother; Diabetes type II in her brother, mother, sister, and sister; Healthy in her sister and sister; Heart attack in her mother and sister; Heart disease in her brother, mother, and sister; Kidney failure in her sister and sister; Other in her brother; Pulmonary disease in her sister; Pulmonary embolism in her father; Stroke in her brother and brother.    ROS:  Please see the history of present illness.   Otherwise, review of systems are positive for irregular heartbeat, cough, and recent episodes of chest pain..   All other systems are reviewed and negative.    PHYSICAL EXAM: VS:  BP 152/56 mmHg  Pulse 66  Wt 126 lb (57.153 kg)  SpO2 98% , BMI Body mass index is 23.82 kg/(m^2). GEN: Well nourished, well developed, in no acute distress HEENT: normal Neck: no JVD, carotid bruits, or masses Cardiac: RRR;  rubs, or gallops,no edema and there is a 3/6 systolic ejection murmur in the aortic area. Respiratory:  clear to auscultation bilaterally, normal work of breathing GI: soft, nontender, nondistended, + BS MS: no deformity or atrophy Skin: warm and dry, no rash Neuro:   Strength and sensation are intact Psych: euthymic mood, full affect Vascular: Radial pulses are normal. Femoral pulses are normal. Distal pulses are normal.    Recent Labs: 11/24/2014: BUN 43*; Creatinine, Ser 1.79*; Potassium 4.3; Sodium 134*    Lipid Panel    Component Value Date/Time   CHOL  05/10/2008 0635    177        ATP III CLASSIFICATION:  <200     mg/dL   Desirable  200-239  mg/dL   Borderline High  >=240    mg/dL   High          TRIG 546* 05/10/2008 0635   HDL 31* 05/10/2008 0635   CHOLHDL 5.7 05/10/2008 0635   VLDL UNABLE TO CALCULATE IF TRIGLYCERIDE OVER 400 mg/dL 05/10/2008 0635   LDLCALC  05/10/2008  9678    UNABLE TO CALCULATE IF TRIGLYCERIDE OVER 400 mg/dL        Total Cholesterol/HDL:CHD Risk Coronary Heart Disease Risk Table                     Men   Women  1/2 Average Risk   3.4   3.3  Average Risk       5.0   4.4  2 X Average Risk   9.6   7.1  3 X Average Risk  23.4   11.0        Use the calculated Patient Ratio above and the CHD Risk Table to determine the patient's CHD Risk.        ATP III CLASSIFICATION (LDL):  <100     mg/dL   Optimal  100-129  mg/dL   Near or Above                    Optimal  130-159  mg/dL   Borderline  160-189  mg/dL   High  >190     mg/dL   Very High      Wt Readings from Last 3 Encounters:  12/12/14 126 lb (57.153 kg)  11/14/14 124 lb 9.6 oz (56.518 kg)  11/10/13 121 lb 8 oz (55.112 kg)         ASSESSMENT AND PLAN:  1. Renovascular hypertension with chronic kidney disease: Blood pressure is still poorly controlled in spite of multiple medications including a diaphoretic. The blood pressure became noticeably uncontrolled since June and over the last year, the patient had worsening chronic kidney disease. All of this is highly suggestive of progressive renal artery stenosis as the culprit for current presentation. The recent renal artery duplex was very challenging and of very suboptimal quality. Thus, I think  the best option is to proceed with renal angiography with CO2 and possible renal artery stenting. I had a prolonged discussion with the patient about risks, benefits and alternatives. I also explained to her the risk of contrast-induced nephropathy. She will be treated with redness on for contrast allergy. She also has multiple allergies including opioids.  She has tolerated Versed in the past.   2. Coronary artery disease involving native coronary artery of native heart without angina pectoris Angina associated with elevated blood pressure  3. Hyperlipidemia On therapy and followed by primary care    Signed,  [name], MD  12/12/2014 9:47 AM    [place]

## 2014-12-20 NOTE — Interval H&P Note (Signed)
History and Physical Interval Note:  12/20/2014 8:46 AM  [name]  has presented today for surgery, with the diagnosis of pvd  The various methods of treatment have been discussed with the patient and family. After consideration of risks, benefits and other options for treatment, the patient has consented to  Procedure(s): Renal Angiography (N/A) as a surgical intervention .  The patient's history has been reviewed, patient examined, no change in status, stable for surgery.  I have reviewed the patient's chart and labs.  Questions were answered to the patient's satisfaction.     [name]

## 2014-12-20 NOTE — Progress Notes (Signed)
Site area: rt groin fa sheath Site Prior to Removal:  Level  0 Pressure Applied For:  25 minutes Manual:   yes Patient Status During Pull:  stable Post Pull Site:  Level  1-faint bruise. 1.5 inch hematoma formed below site/resolved Post Pull Instructions Given: yes  Post Pull Pulses Present: yes Dressing Applied:  tegaderm Bedrest begins @  1005 Comments: patient restless/fidgity/shifting hips during sheath pull. Instructions reinforced.

## 2014-12-20 NOTE — Progress Notes (Signed)
No longer fidgety.

## 2014-12-20 NOTE — Discharge Instructions (Signed)

## 2014-12-21 ENCOUNTER — Encounter (HOSPITAL_COMMUNITY): Payer: Self-pay | Admitting: Cardiovascular Disease

## 2014-12-22 ENCOUNTER — Ambulatory Visit: Payer: Medicare Other | Admitting: Pharmacist

## 2014-12-25 ENCOUNTER — Telehealth: Payer: Self-pay | Admitting: Cardiovascular Disease

## 2014-12-25 DIAGNOSIS — N183 Chronic kidney disease, stage 3 unspecified: Secondary | ICD-10-CM

## 2014-12-25 NOTE — Telephone Encounter (Signed)
New message    Pt calling in regards to the b/p medication that doctor wants her to try Pt is wanting to see if b/p medication could be call in and b/p is up  Pt c/o BP issue: STAT if pt c/o blurred vision, one-sided weakness or slurred speech  1. What are your last 5 BP readings? 210/84 Today @ 8am; 200/84 today @ 7am; 180/84 Sunday   2. Are you having any other symptoms (ex. Dizziness, headache, blurred vision, passed out)? No  3. What is your BP issue? B/p running higher than normal

## 2014-12-25 NOTE — Telephone Encounter (Signed)
I spoke with the pt and her BP at this time is 160/70.  The pt's only complaint is pressure in her ears. The pt said that Dr Fletcher Anon spoke with her husband about trying a new BP medication and she is interested. I will have to speak with Dr Fletcher Anon to determine what medication was recommended.  The pt would also like to know if she needs to keep her post hospital appointment with Dr Fletcher Anon since she did not get a stent. I will discuss this pt with Dr Fletcher Anon and contact her with his response.

## 2014-12-26 MED ORDER — CHLORTHALIDONE 25 MG PO TABS: 25.0000 mg | ORAL_TABLET | Freq: Every day | ORAL | Status: DC

## 2014-12-26 NOTE — Telephone Encounter (Signed)
I spoke with the pt and made her aware of medication change. The pt has a scheduled appointment with Dr Fletcher Anon on 01/02/15 and we will repeat BMP at that time. Order has been placed for renal ultrasound to be performed.  Pt verbalized understanding of instructions.

## 2014-12-26 NOTE — Telephone Encounter (Signed)
I want to switch Maxzide to Chlorthalidone 25 mg once daily. Check BMP in 1 week. I also want her to have a renal ultrasound for CKD to re-measure kidney size and see if it is work opening the blocked stent.

## 2014-12-29 ENCOUNTER — Ambulatory Visit
Admission: RE | Admit: 2014-12-29 | Discharge: 2014-12-29 | Disposition: A | Discharge status: Home / Self Care | Payer: Medicare Other | Source: Ambulatory Visit | Attending: Cardiovascular Disease | Admitting: Cardiovascular Disease

## 2014-12-29 ENCOUNTER — Telehealth: Payer: Self-pay | Admitting: Cardiovascular Disease

## 2014-12-29 DIAGNOSIS — N183 Chronic kidney disease, stage 3 unspecified: Secondary | ICD-10-CM

## 2014-12-29 MED ORDER — TRIAMTERENE-HCTZ 37.5-25 MG PO TABS: 0.5000 | ORAL_TABLET | Freq: Every day | ORAL | Status: DC

## 2014-12-29 NOTE — Telephone Encounter (Signed)
I spoke with the pt and she had multiple side effects from Chlorthalidone.  The pt stopped this medication yesterday and I advised that she restart Maxzide at this time.  The pt is scheduled to follow-up in our office next Tuesday with Dr Fletcher Anon. The pt is also having a renal ultrasound today.

## 2014-12-29 NOTE — Telephone Encounter (Signed)
New message     Pt c/o medication issue:  1. Name of Medication: Chlorthalidone  2. How are you currently taking this medication (dosage and times per day)? 25mg  1 time a day  3. Are you having a reaction (difficulty breathing--STAT)? No  4. What is your medication issue? Causing dizziness; increased b/p and increased blood sugar Pt stopped taking medication yesterday

## 2014-12-29 NOTE — Telephone Encounter (Signed)
Medication list updated.

## 2015-01-01 ENCOUNTER — Encounter: Payer: Self-pay | Admitting: *Deleted

## 2015-01-02 ENCOUNTER — Encounter: Payer: Self-pay | Admitting: Cardiovascular Disease

## 2015-01-02 ENCOUNTER — Ambulatory Visit (INDEPENDENT_AMBULATORY_CARE_PROVIDER_SITE_OTHER): Payer: Medicare Other | Admitting: Cardiovascular Disease

## 2015-01-02 VITALS — BP 182/54 | HR 62 | Ht 61.0 in | Wt 125.8 lb

## 2015-01-02 DIAGNOSIS — I701 Atherosclerosis of renal artery: Secondary | ICD-10-CM | POA: Diagnosis not present

## 2015-01-02 LAB — BASIC METABOLIC PANEL
BUN: 38 mg/dL — ABNORMAL HIGH (ref 6–23)
CALCIUM: 10 mg/dL (ref 8.4–10.5)
CO2: 30 mEq/L (ref 19–32)
Chloride: 89 mEq/L — ABNORMAL LOW (ref 96–112)
Creatinine, Ser: 1.61 mg/dL — ABNORMAL HIGH (ref 0.40–1.20)
GFR: 32.65 mL/min — AB (ref >60.00)
Glucose, Bld: 146 mg/dL — ABNORMAL HIGH (ref 70–99)
Potassium: 3.9 mEq/L (ref 3.5–5.1)
SODIUM: 126 meq/L — AB (ref 135–145)

## 2015-01-02 MED ORDER — CLONIDINE HCL 0.1 MG PO TABS: 0.1000 mg | ORAL_TABLET | Freq: Two times a day (BID) | ORAL | Status: DC

## 2015-01-02 NOTE — Progress Notes (Signed)
Cardiology Office Note   Date:  01/02/2015   ID:  [name], DOB 1931/05/02, MRN [id number]  PCP:  Irven Shelling, MD  Cardiologist:  [name], MD   No chief complaint on file.     History of Present Illness: [name] is a 81 y.o. female who was referred by Dr. Tamala Julian for evaluation of refractory hypertension and renal artery stenosis. She has known history of  coronary artery disease with chronic right coronary total occlusion, history of renal artery stenosis, renal vascular hypertension with difficult to control blood pressure, multiple medication intolerances. She had left renal artery stent placement in 2012 by Dr. Irish Lack. She reports reasonable blood pressure control after that up until recently.  In June the patient awakened with chest discomfort. Her blood pressure was greater than 867 mmHg systolic. Nitroglycerin and clonidine relieved the discomfort. She has subsequently seen the primary physician, Dr. Lavone Orn. Adjustments in medications were made but blood pressure remained high. The dose of Maxzide was doubled but the patient developed hyponatremia. Nifedipine was added but this was not tolerated due to headache. The patient did not tolerate hydralazine in the past due to GI symptoms. She has multiple allergies and intolerance. Blood pressure has not been well controlled at all since June. I proceeded with CO2 angiography of the renal arteries which showed occluded left renal artery stent. No revascularization was performed as the timing of occlusion was not entirely clear. I proceeded with renal ultrasound to evaluate kidney size. This showed a left renal size of 9 cm. Right renal size was 9.7 cm. There was an incidental finding of a solid-appearing mass within the posterior wall of the bladder. I switched Maxzide to chlorthalidone but she did not tolerate the medication due to dizziness.    Past Medical History  Diagnosis Date  . CAD (coronary  artery disease)   . Renal artery stenosis   . HTN (hypertension)   . Dyslipidemia   . DM (diabetes mellitus)   . GERD (gastroesophageal reflux disease)   . Osteoporosis     Past Surgical History  Procedure Laterality Date  . Right hip fx 2011    . Left wrist fx 2012    . Abdominal hysterectomy    . Cholecystectomy    . Bladder tac      x3  . Cardiac catheterization    . Neck sx    . Breast surgery      breast reduction 1985  . Left renal stent    . Peripheral vascular catheterization Left 12/20/2014    Procedure: Renal Angiography;  Surgeon: Wellington Hampshire, MD;  Location: [facility];  Service: Cardiovascular;  Laterality: Left;     Current Outpatient Prescriptions  Medication Sig Dispense Refill  . ACCU-CHEK COMPACT PLUS test strip 1 each by Other route See admin instructions. Check blood sugar at bedtime  6  . ACCU-CHEK SOFTCLIX LANCETS lancets 1 each by Other route See admin instructions. Check blood sugar at bedtime  5  . aspirin 325 MG tablet Take 325 mg by mouth at bedtime.    [name] azelastine (ASTELIN) 0.1 % nasal spray SPRAY 1-2 TIMES IN EACH NOSTRIL TWICE A DAY AS NEEDED FOR CONGESTION  11  . betamethasone dipropionate (DIPROLENE) 0.05 % cream Apply 1 application topically daily as needed (irritated skin).    . Calcium Citrate (CITRACAL PO) Take 1,200 mg by mouth daily.    . carvedilol (COREG) 25 MG tablet Take 25 mg  by mouth 2 (two) times daily with a meal.    . cholecalciferol (VITAMIN D) 1000 UNITS tablet Take 2,000 Units by mouth daily.     . cloNIDine (CATAPRES) 0.1 MG tablet Take 1 tablet (0.1 mg total) by mouth daily as needed (For systolic BP >998). 30 tablet 6  . Cod Liver Oil CAPS Take 1 capsule by mouth daily.    [name] esomeprazole (NEXIUM) 40 MG capsule Take 40 mg by mouth 2 (two) times daily.    . fexofenadine (ALLEGRA) 180 MG tablet Take 180 mg by mouth daily.    . meclizine (ANTIVERT) 25 MG tablet Take 25 mg by mouth 3 (three) times daily.     .  Multiple Vitamins-Minerals (PRESERVISION AREDS) CAPS Take 1 capsule by mouth 2 (two) times daily.     . nitroGLYCERIN (NITROSTAT) 0.4 MG SL tablet Place 0.4 mg under the tongue every 5 (five) minutes as needed for chest pain (3 doses MAX).    [name] olmesartan (BENICAR) 40 MG tablet Take 40 mg by mouth daily.    [name] PREMARIN vaginal cream Place 1-2 g vaginally 2 (two) times a week.  4  . simvastatin (ZOCOR) 20 MG tablet Take 20 mg by mouth daily.     [name] triamterene-hydrochlorothiazide (MAXZIDE-25) 37.5-25 MG per tablet Take 0.5 tablets by mouth daily.    [name] VITAMIN E PO Take 1 capsule by mouth daily.     No current facility-administered medications for this visit.    Allergies:   Fentanyl; Morphine and related; Actamin; Actonel; Amlodipine; Avapro; Cardio complete; Cardizem; Ciprofloxacin; Codeine; Dilaudid; Forteo; Fosamax; Hydralazine; Inapsine; Ivp dye; Lipitor; Lisinopril; Micardis; Septra; Shellfish allergy; Tekturna; Tricor; and Tussionex pennkinetic er    Social History:  The patient  reports that she has quit smoking. She has never used smokeless tobacco. She reports that she does not drink alcohol or use illicit drugs.   Family History:  The patient's family history includes Alzheimer's disease in her sister; Congestive Heart Failure in her father; Dementia in her brother; Diabetes type II in her brother, mother, sister, and sister; Healthy in her sister and sister; Heart attack in her mother and sister; Heart disease in her brother, mother, and sister; Kidney failure in her sister and sister; Other in her brother; Pulmonary disease in her sister; Pulmonary embolism in her father; Stroke in her brother and brother.    ROS:  Please see the history of present illness.   Otherwise, review of systems are positive for irregular heartbeat, cough, and recent episodes of chest pain..   All other systems are reviewed and negative.    PHYSICAL EXAM: VS:  There were no vitals taken for this visit. , BMI  There is no weight on file to calculate BMI. GEN: Well nourished, well developed, in no acute distress HEENT: normal Neck: no JVD, carotid bruits, or masses Cardiac: RRR;  rubs, or gallops,no edema and there is a 3/6 systolic ejection murmur in the aortic area. Respiratory:  clear to auscultation bilaterally, normal work of breathing GI: soft, nontender, nondistended, + BS MS: no deformity or atrophy Skin: warm and dry, no rash Neuro:  Strength and sensation are intact Psych: euthymic mood, full affect Vascular: Radial pulses are normal. Femoral pulses are normal. Distal pulses are normal. No groin hematoma.    Recent Labs: 12/12/2014: BUN 38*; Creatinine, Ser 1.61*; Hemoglobin 12.4; Platelets 234.0; Potassium 4.3; Sodium 132*    Lipid Panel    Component Value Date/Time   CHOL  05/10/2008 3382  177        ATP III CLASSIFICATION:  <200     mg/dL   Desirable  200-239  mg/dL   Borderline High  >=240    mg/dL   High          TRIG 546* 05/10/2008 0635   HDL 31* 05/10/2008 0635   CHOLHDL 5.7 05/10/2008 0635   VLDL UNABLE TO CALCULATE IF TRIGLYCERIDE OVER 400 mg/dL 05/10/2008 0635   LDLCALC  05/10/2008 0635    UNABLE TO CALCULATE IF TRIGLYCERIDE OVER 400 mg/dL        Total Cholesterol/HDL:CHD Risk Coronary Heart Disease Risk Table                     Men   Women  1/2 Average Risk   3.4   3.3  Average Risk       5.0   4.4  2 X Average Risk   9.6   7.1  3 X Average Risk  23.4   11.0        Use the calculated Patient Ratio above and the CHD Risk Table to determine the patient's CHD Risk.        ATP III CLASSIFICATION (LDL):  <100     mg/dL   Optimal  100-129  mg/dL   Near or Above                    Optimal  130-159  mg/dL   Borderline  160-189  mg/dL   High  >190     mg/dL   Very High      Wt Readings from Last 3 Encounters:  12/20/14 120 lb (54.432 kg)  12/12/14 126 lb (57.153 kg)  11/14/14 124 lb 9.6 oz (56.518 kg)         ASSESSMENT AND PLAN:  1.  Renovascular hypertension with chronic kidney disease: The left renal artery stent was found to be recently occluded. This probably has been occluded for few months. Thus, there is probably unkown benefit from revascularization. However, recent renal ultrasound showed left renal size to be reasonable at 9 cm which might indicate some viability.  Biggest issue is intolerance to multiple  medications. She tolerates Clonidine and thus I decided to change this to 0.1 mg bid instead of prn.  If not improvement with BP, I might consider revascularization of left renal artery occlusion after addressing the mass noted on posterior bladder wall.    2. Bladder mass: I called Dr. Laurann Montana and discussed this with him. He will contact the patient and arrange for further imaging.   3. Coronary artery disease involving native coronary artery of native heart without angina pectoris Angina associated with elevated blood pressure  4. Hyperlipidemia On therapy and followed by primary care    Signed,  [name], MD  01/02/2015 10:29 AM    [place]

## 2015-01-02 NOTE — Patient Instructions (Signed)
Medication Instructions:  Your physician has recommended you make the following change in your medication:  1. INCREASE Clonidine 0.1mg  take one tablet by mouth twice a day  Labwork: Your physician recommends that you have lab work today: BMP  Testing/Procedures: Dr Delene Ruffini office will contact you in regards to CT follow-up for bladder mass.   Follow-Up: Your physician recommends that you schedule a follow-up appointment in: 1 MONTH with Dr Fletcher Anon   Any Other Special Instructions Will Be Listed Below (If Applicable).

## 2015-01-03 ENCOUNTER — Encounter: Payer: Self-pay | Admitting: Cardiovascular Disease

## 2015-01-29 ENCOUNTER — Emergency Department (HOSPITAL_COMMUNITY)
Admission: EM | Admit: 2015-01-29 | Discharge: 2015-01-30 | Disposition: A | Discharge status: Home / Self Care | Payer: Medicare Other | Attending: Emergency Medicine | Admitting: Emergency Medicine

## 2015-01-29 ENCOUNTER — Encounter (HOSPITAL_COMMUNITY): Payer: Self-pay | Admitting: Family Medicine

## 2015-01-29 DIAGNOSIS — M810 Age-related osteoporosis without current pathological fracture: Secondary | ICD-10-CM | POA: Insufficient documentation

## 2015-01-29 DIAGNOSIS — R1032 Left lower quadrant pain: Secondary | ICD-10-CM

## 2015-01-29 DIAGNOSIS — Z9889 Other specified postprocedural states: Secondary | ICD-10-CM | POA: Diagnosis not present

## 2015-01-29 DIAGNOSIS — E785 Hyperlipidemia, unspecified: Secondary | ICD-10-CM | POA: Insufficient documentation

## 2015-01-29 DIAGNOSIS — Z87891 Personal history of nicotine dependence: Secondary | ICD-10-CM | POA: Diagnosis not present

## 2015-01-29 DIAGNOSIS — E119 Type 2 diabetes mellitus without complications: Secondary | ICD-10-CM | POA: Diagnosis not present

## 2015-01-29 DIAGNOSIS — R112 Nausea with vomiting, unspecified: Secondary | ICD-10-CM | POA: Diagnosis not present

## 2015-01-29 DIAGNOSIS — K219 Gastro-esophageal reflux disease without esophagitis: Secondary | ICD-10-CM | POA: Insufficient documentation

## 2015-01-29 DIAGNOSIS — I10 Essential (primary) hypertension: Secondary | ICD-10-CM | POA: Diagnosis not present

## 2015-01-29 DIAGNOSIS — K5900 Constipation, unspecified: Secondary | ICD-10-CM | POA: Insufficient documentation

## 2015-01-29 DIAGNOSIS — Z79899 Other long term (current) drug therapy: Secondary | ICD-10-CM | POA: Insufficient documentation

## 2015-01-29 DIAGNOSIS — I2510 Atherosclerotic heart disease of native coronary artery without angina pectoris: Secondary | ICD-10-CM | POA: Diagnosis not present

## 2015-01-29 DIAGNOSIS — Z7982 Long term (current) use of aspirin: Secondary | ICD-10-CM | POA: Diagnosis not present

## 2015-01-29 LAB — URINALYSIS, ROUTINE W REFLEX MICROSCOPIC
Bilirubin Urine: NEGATIVE
GLUCOSE, UA: NEGATIVE mg/dL
HGB URINE DIPSTICK: NEGATIVE
KETONES UR: NEGATIVE mg/dL
LEUKOCYTES UA: NEGATIVE
Nitrite: NEGATIVE
PROTEIN: 100 mg/dL — AB
Specific Gravity, Urine: 1.01 (ref 1.005–1.030)
UROBILINOGEN UA: 0.2 mg/dL (ref 0.0–1.0)
pH: 7 (ref 5.0–8.0)

## 2015-01-29 LAB — CBC
HCT: 35.7 % — ABNORMAL LOW (ref 36.0–46.0)
Hemoglobin: 12.3 g/dL (ref 12.0–15.0)
MCH: 29 pg (ref 26.0–34.0)
MCHC: 34.5 g/dL (ref 30.0–36.0)
MCV: 84.2 fL (ref 78.0–100.0)
Platelets: 246 K/uL (ref 150–400)
RBC: 4.24 MIL/uL (ref 3.87–5.11)
RDW: 13 % (ref 11.5–15.5)
WBC: 7.2 K/uL (ref 4.0–10.5)

## 2015-01-29 LAB — BASIC METABOLIC PANEL
ANION GAP: 10 (ref 5–15)
BUN: 23 mg/dL — AB (ref 6–20)
CALCIUM: 8.5 mg/dL — AB (ref 8.9–10.3)
CO2: 23 mmol/L (ref 22–32)
Chloride: 90 mmol/L — ABNORMAL LOW (ref 101–111)
Creatinine, Ser: 1.38 mg/dL — ABNORMAL HIGH (ref 0.44–1.00)
GFR calc Af Amer: 41 mL/min — ABNORMAL LOW (ref >60)
GFR, EST NON AFRICAN AMERICAN: 35 mL/min — AB (ref >60)
GLUCOSE: 126 mg/dL — AB (ref 65–99)
Potassium: 4.2 mmol/L (ref 3.5–5.1)
Sodium: 123 mmol/L — ABNORMAL LOW (ref 135–145)

## 2015-01-29 LAB — URINE MICROSCOPIC-ADD ON

## 2015-01-29 LAB — I-STAT CG4 LACTIC ACID, ED: LACTIC ACID, VENOUS: 0.9 mmol/L (ref 0.5–2.0)

## 2015-01-29 MED ORDER — BARIUM SULFATE 2.1 % PO SUSP
ORAL | Status: AC
  Administered 2015-01-29: 1 mL
  Filled 2015-01-29: qty 2

## 2015-01-29 MED ORDER — PROMETHAZINE HCL 25 MG/ML IJ SOLN
12.5000 mg | Freq: Once | INTRAMUSCULAR | Status: AC
  Administered 2015-01-29: 12.5 mg via INTRAVENOUS
  Filled 2015-01-29: qty 1

## 2015-01-29 MED ORDER — MEPERIDINE HCL 25 MG/ML IJ SOLN
12.5000 mg | Freq: Once | INTRAMUSCULAR | Status: AC
  Administered 2015-01-29: 12.5 mg via INTRAVENOUS
  Filled 2015-01-29: qty 1

## 2015-01-29 NOTE — ED Notes (Signed)
Pt here for left flank pain, vomiting and constipation.

## 2015-01-29 NOTE — ED Provider Notes (Signed)
CSN: [id number]     Arrival date & time 01/29/15  1656 History   First MD Initiated Contact with Patient 01/29/15 2102     Chief Complaint  Patient presents with  . Flank Pain  . Constipation     (Consider location/radiation/quality/duration/timing/severity/associated sxs/prior Treatment) HPI Comments: Patient is an 81 year old female with a past medical history of CAD, renal artery stenosis, hypertension, diabetes, and GERD who presents with abdominal pain that started 4 days ago. The pain is located on the LLQ and radiates around to her back. The pain is described as aching and sharp and severe. The pain started gradually and progressively worsened since the onset. No alleviating/aggravating factors. The patient has tried nothing for symptoms without relief. Associated symptoms include nausea, constipation, and vomiting. Patient denies fever, headache, diarrhea, chest pain, SOB, dysuria, constipation. Patient's abdominal surgical history includes appendectomy.    Past Medical History  Diagnosis Date  . CAD (coronary artery disease)   . Renal artery stenosis ([place])   . HTN (hypertension)   . Dyslipidemia   . DM (diabetes mellitus) ([place])   . GERD (gastroesophageal reflux disease)   . Osteoporosis    Past Surgical History  Procedure Laterality Date  . Right hip fx 2011    . Left wrist fx 2012    . Abdominal hysterectomy    . Cholecystectomy    . Bladder tac      x3  . Cardiac catheterization    . Neck sx    . Breast surgery      breast reduction 1985  . Left renal stent    . Peripheral vascular catheterization Left 12/20/2014    Procedure: Renal Angiography;  Surgeon: Wellington Hampshire, MD;  Location: [facility];  Service: Cardiovascular;  Laterality: Left;   Family History  Problem Relation Age of Onset  . Diabetes type II Mother   . Heart attack Mother   . Heart disease Mother   . Congestive Heart Failure Father   . Pulmonary embolism Father   . Alzheimer's  disease Sister   . Heart disease Sister   . Heart disease Brother   . Healthy Sister   . Healthy Sister   . Stroke Brother   . Dementia Brother     VASCULAR  . Stroke Brother   . Diabetes type II Brother   . Other Brother     KILLED IN PLANE CRASH  . Pulmonary disease Sister   . Heart attack Sister   . Kidney failure Sister   . Diabetes type II Sister   . Diabetes type II Sister   . Kidney failure Sister    Social History  Substance Use Topics  . Smoking status: Former Research scientist (life sciences)  . Smokeless tobacco: Never Used  . Alcohol Use: No   OB History    No data available     Review of Systems  Gastrointestinal: Positive for nausea, vomiting and abdominal pain.  All other systems reviewed and are negative.     Allergies  Fentanyl; Morphine and related; Actamin; Actonel; Amlodipine; Avapro; Cardio complete; Cardizem; Ciprofloxacin; Codeine; Dilaudid; Forteo; Fosamax; Hydralazine; Inapsine; Ivp dye; Lipitor; Lisinopril; Micardis; Septra; Shellfish allergy; Tekturna; Tricor; and Tussionex pennkinetic er  Home Medications   Prior to Admission medications   Medication Sig Start Date End Date Taking? Authorizing Provider  aspirin 325 MG tablet Take 325 mg by mouth at bedtime.   Yes Historical Provider, MD  betamethasone dipropionate (DIPROLENE) 0.05 % cream Apply 1 application  topically daily as needed (irritated skin).   Yes Historical Provider, MD  Calcium Citrate (CITRACAL PO) Take 1,200 mg by mouth daily.   Yes Historical Provider, MD  carvedilol (COREG) 25 MG tablet Take 25 mg by mouth 2 (two) times daily with a meal.   Yes Historical Provider, MD  cholecalciferol (VITAMIN D) 1000 UNITS tablet Take 2,000 Units by mouth daily.    Yes Historical Provider, MD  cloNIDine (CATAPRES) 0.1 MG tablet Take 1 tablet (0.1 mg total) by mouth 2 (two) times daily. 01/02/15 01/18/16 Yes Wellington Hampshire, MD  Cod Liver Oil CAPS Take 1 capsule by mouth daily.   Yes Historical Provider, MD   esomeprazole (NEXIUM) 40 MG capsule Take 40 mg by mouth 2 (two) times daily.   Yes Historical Provider, MD  fexofenadine (ALLEGRA) 180 MG tablet Take 180 mg by mouth daily.   Yes Historical Provider, MD  meclizine (ANTIVERT) 25 MG tablet Take 25 mg by mouth 3 (three) times daily.    Yes Historical Provider, MD  Multiple Vitamins-Minerals (PRESERVISION AREDS) CAPS Take 1 capsule by mouth 2 (two) times daily.    Yes Historical Provider, MD  olmesartan (BENICAR) 40 MG tablet Take 40 mg by mouth daily.   Yes Historical Provider, MD  PREMARIN vaginal cream Place 1-2 g vaginally 2 (two) times a week. 10/06/14  Yes Historical Provider, MD  simvastatin (ZOCOR) 20 MG tablet Take 20 mg by mouth daily.  10/14/11  Yes Historical Provider, MD  triamterene-hydrochlorothiazide (MAXZIDE-25) 37.5-25 MG per tablet Take 0.5 tablets by mouth daily. 12/29/14  Yes Wellington Hampshire, MD  VITAMIN E PO Take 1 capsule by mouth daily.   Yes Historical Provider, MD  ACCU-CHEK COMPACT PLUS test strip 1 each by Other route See admin instructions. Check blood sugar at bedtime 09/22/14   Historical Provider, MD  ACCU-CHEK SOFTCLIX LANCETS lancets 1 each by Other route See admin instructions. Check blood sugar at bedtime 10/09/14   Historical Provider, MD  azelastine (ASTELIN) 0.1 % nasal spray SPRAY 1-2 TIMES IN EACH NOSTRIL TWICE A DAY AS NEEDED FOR CONGESTION 12/06/14   Historical Provider, MD  nitroGLYCERIN (NITROSTAT) 0.4 MG SL tablet Place 0.4 mg under the tongue every 5 (five) minutes as needed for chest pain (3 doses MAX).    Historical Provider, MD   BP 158/63 mmHg  Pulse 57  Temp(Src) 97.3 F (36.3 C) (Oral)  Resp 16  Ht 5\' 1"  (1.549 m)  Wt 123 lb 1 oz (55.821 kg)  BMI 23.26 kg/m2  SpO2 97% Physical Exam  Constitutional: She is oriented to person, place, and time. She appears well-developed and well-nourished. No distress.  HENT:  Head: Normocephalic and atraumatic.  Eyes: Conjunctivae and EOM are normal.  Neck:  Normal range of motion.  Cardiovascular: Normal rate and regular rhythm.  Exam reveals no gallop and no friction rub.   No murmur heard. Pulmonary/Chest: Effort normal and breath sounds normal. She has no wheezes. She has no rales. She exhibits no tenderness.  Abdominal: Soft. She exhibits no distension. There is tenderness. There is no rebound.  Left lower abdominal tenderness to palpation. No other focal tenderness or peritoneal signs.   Genitourinary:  No CVA tenderness   Musculoskeletal: Normal range of motion.  Neurological: She is alert and oriented to person, place, and time. Coordination normal.  Speech is goal-oriented. Moves limbs without ataxia.   Skin: Skin is warm and dry.  Psychiatric: She has a normal mood and affect. Her behavior is  normal.  Nursing note and vitals reviewed.   ED Course  Procedures (including critical care time) Labs Review Labs Reviewed  BASIC METABOLIC PANEL - Abnormal; Notable for the following:    Sodium 123 (*)    Chloride 90 (*)    Glucose, Bld 126 (*)    BUN 23 (*)    Creatinine, Ser 1.38 (*)    Calcium 8.5 (*)    GFR calc non Af Amer 35 (*)    GFR calc Af Amer 41 (*)    All other components within normal limits  CBC - Abnormal; Notable for the following:    HCT 35.7 (*)    All other components within normal limits  URINALYSIS, ROUTINE W REFLEX MICROSCOPIC (NOT AT [facility])  I-STAT CG4 LACTIC ACID, ED    Imaging Review Ct Abdomen Pelvis Wo Contrast  01/30/2015  CLINICAL DATA:  Acute onset of left flank pain, vomiting and constipation. Initial encounter. EXAM: CT ABDOMEN AND PELVIS WITHOUT CONTRAST TECHNIQUE: Multidetector CT imaging of the abdomen and pelvis was performed following the standard protocol without IV contrast. COMPARISON:  CT of the abdomen pelvis from 08/20/2012, and renal ultrasound performed 12/29/2014 FINDINGS: The visualized lung bases are clear. Calcification is noted at the mitral and aortic valves. Scattered coronary  artery calcifications are seen. The liver and spleen are unremarkable in appearance. The patient is status post cholecystectomy, with clips noted at the gallbladder fossa. The pancreas and adrenal glands are unremarkable. Small bilateral renal cysts are seen. Nonspecific perinephric stranding is noted bilaterally. Mild left renal atrophy is noted. There is no evidence of hydronephrosis. No renal or ureteral stones are identified. No free fluid is identified. The small bowel is unremarkable in appearance. A small duodenal diverticulum is noted at the pancreatic head. The stomach is within normal limits. No acute vascular abnormalities are seen. Scattered calcification is noted along the abdominal aorta and its branches. The appendix is not seen. There is no evidence for appendicitis. Scattered diverticulosis is noted along the descending and sigmoid colon, without evidence of diverticulitis. The bladder is moderately distended and grossly unremarkable. The patient is status post hysterectomy. No suspicious adnexal masses are seen. No inguinal lymphadenopathy is seen. No acute osseous abnormalities are identified. Visualized right femoral hardware is grossly unremarkable in appearance. Multilevel vacuum phenomenon is noted along the lower thoracic and lumbar spine, with endplate sclerotic changes noted at multiple levels. IMPRESSION: 1. No acute abnormality seen to explain the patient's symptoms. 2. Mild left renal atrophy noted.  Small bilateral renal cysts seen. 3. Calcification at the mitral and aortic valves. Scattered coronary artery calcification noted. 4. Small duodenal diverticulum at the pancreatic head. 5. Scattered calcification along the abdominal aorta and its branches. 6. Scattered diverticulosis along the descending and sigmoid colon, without evidence of diverticulitis. 7. Degenerative change along the lower thoracic and lumbar spine. Electronically Signed   By: Garald Balding M.D.   On: 01/30/2015  02:15   I have personally reviewed and evaluated these images and lab results as part of my medical decision-making.   EKG Interpretation None      MDM   Final diagnoses:  LLQ abdominal pain  Non-intractable vomiting with nausea, vomiting of unspecified type    1:59 AM Patient's labs show hyponatremia. Remaining labs unremarkable for acute changes. CT abdomen pelvic pending to evaluate abdominal pain.    CT abdomen pelvis shows no acute changes to account for patient's symptoms. Patient's pain and nausea improved. Patient will be discharged with  bentyl for pain and phenergan for nausea. Patient advised to follow up with her PCP for further evaluation. Patient will be discharged in stable condition.    Alvina Chou, PA-C 01/30/15 [place], MD 01/30/15 [phone]

## 2015-01-30 ENCOUNTER — Emergency Department (HOSPITAL_COMMUNITY): Payer: Medicare Other

## 2015-01-30 DIAGNOSIS — R1032 Left lower quadrant pain: Secondary | ICD-10-CM | POA: Diagnosis not present

## 2015-01-30 LAB — I-STAT CG4 LACTIC ACID, ED: Lactic Acid, Venous: 0.68 mmol/L (ref 0.5–2.0)

## 2015-01-30 MED ORDER — DICYCLOMINE HCL 20 MG PO TABS: 20.0000 mg | ORAL_TABLET | Freq: Two times a day (BID) | ORAL | Status: DC

## 2015-01-30 MED ORDER — PROMETHAZINE HCL 25 MG PO TABS: 12.5000 mg | ORAL_TABLET | Freq: Four times a day (QID) | ORAL | Status: DC | PRN

## 2015-01-30 MED ORDER — LORAZEPAM 2 MG/ML IJ SOLN
1.0000 mg | Freq: Once | INTRAMUSCULAR | Status: AC
  Administered 2015-01-30: 1 mg via INTRAVENOUS
  Filled 2015-01-30: qty 1

## 2015-01-30 NOTE — Discharge Instructions (Signed)
Take bentyl as needed for pain. Take phenergan as needed for nausea. Refer to attached documents for more information.

## 2015-02-03 ENCOUNTER — Encounter (HOSPITAL_COMMUNITY): Payer: Self-pay | Admitting: *Deleted

## 2015-02-03 ENCOUNTER — Inpatient Hospital Stay (HOSPITAL_COMMUNITY)
Admission: EM | Admit: 2015-02-03 | Discharge: 2015-02-07 | DRG: 392 | Disposition: A | Discharge status: Home Health | Payer: Medicare Other | Attending: Internal Medicine | Admitting: Internal Medicine

## 2015-02-03 DIAGNOSIS — R52 Pain, unspecified: Secondary | ICD-10-CM

## 2015-02-03 DIAGNOSIS — M810 Age-related osteoporosis without current pathological fracture: Secondary | ICD-10-CM | POA: Diagnosis present

## 2015-02-03 DIAGNOSIS — I129 Hypertensive chronic kidney disease with stage 1 through stage 4 chronic kidney disease, or unspecified chronic kidney disease: Secondary | ICD-10-CM | POA: Diagnosis present

## 2015-02-03 DIAGNOSIS — E119 Type 2 diabetes mellitus without complications: Secondary | ICD-10-CM

## 2015-02-03 DIAGNOSIS — E1122 Type 2 diabetes mellitus with diabetic chronic kidney disease: Secondary | ICD-10-CM | POA: Diagnosis present

## 2015-02-03 DIAGNOSIS — Z885 Allergy status to narcotic agent status: Secondary | ICD-10-CM | POA: Diagnosis not present

## 2015-02-03 DIAGNOSIS — Z8249 Family history of ischemic heart disease and other diseases of the circulatory system: Secondary | ICD-10-CM

## 2015-02-03 DIAGNOSIS — R109 Unspecified abdominal pain: Secondary | ICD-10-CM | POA: Diagnosis present

## 2015-02-03 DIAGNOSIS — Z888 Allergy status to other drugs, medicaments and biological substances status: Secondary | ICD-10-CM

## 2015-02-03 DIAGNOSIS — I150 Renovascular hypertension: Secondary | ICD-10-CM | POA: Diagnosis present

## 2015-02-03 DIAGNOSIS — N183 Chronic kidney disease, stage 3 (moderate): Secondary | ICD-10-CM | POA: Diagnosis present

## 2015-02-03 DIAGNOSIS — Z833 Family history of diabetes mellitus: Secondary | ICD-10-CM | POA: Diagnosis not present

## 2015-02-03 DIAGNOSIS — R1032 Left lower quadrant pain: Secondary | ICD-10-CM | POA: Diagnosis present

## 2015-02-03 DIAGNOSIS — Z823 Family history of stroke: Secondary | ICD-10-CM

## 2015-02-03 DIAGNOSIS — H8109 Meniere's disease, unspecified ear: Secondary | ICD-10-CM | POA: Diagnosis present

## 2015-02-03 DIAGNOSIS — Z820 Family history of epilepsy and other diseases of the nervous system: Secondary | ICD-10-CM | POA: Diagnosis not present

## 2015-02-03 DIAGNOSIS — E785 Hyperlipidemia, unspecified: Secondary | ICD-10-CM | POA: Diagnosis present

## 2015-02-03 DIAGNOSIS — Z91013 Allergy to seafood: Secondary | ICD-10-CM | POA: Diagnosis not present

## 2015-02-03 DIAGNOSIS — I701 Atherosclerosis of renal artery: Secondary | ICD-10-CM | POA: Diagnosis present

## 2015-02-03 DIAGNOSIS — Z7982 Long term (current) use of aspirin: Secondary | ICD-10-CM | POA: Diagnosis not present

## 2015-02-03 DIAGNOSIS — Z79899 Other long term (current) drug therapy: Secondary | ICD-10-CM

## 2015-02-03 DIAGNOSIS — Z87891 Personal history of nicotine dependence: Secondary | ICD-10-CM

## 2015-02-03 DIAGNOSIS — K219 Gastro-esophageal reflux disease without esophagitis: Secondary | ICD-10-CM | POA: Diagnosis present

## 2015-02-03 DIAGNOSIS — I2510 Atherosclerotic heart disease of native coronary artery without angina pectoris: Secondary | ICD-10-CM | POA: Diagnosis present

## 2015-02-03 DIAGNOSIS — E871 Hypo-osmolality and hyponatremia: Secondary | ICD-10-CM | POA: Diagnosis present

## 2015-02-03 DIAGNOSIS — Z91041 Radiographic dye allergy status: Secondary | ICD-10-CM | POA: Diagnosis not present

## 2015-02-03 DIAGNOSIS — I10 Essential (primary) hypertension: Secondary | ICD-10-CM | POA: Diagnosis present

## 2015-02-03 HISTORY — DX: Meniere's disease, unspecified ear: H81.09

## 2015-02-03 LAB — COMPREHENSIVE METABOLIC PANEL
ALBUMIN: 3.3 g/dL — AB (ref 3.5–5.0)
ALK PHOS: 58 U/L (ref 38–126)
ALT: 16 U/L (ref 14–54)
AST: 23 U/L (ref 15–41)
Anion gap: 14 (ref 5–15)
BILIRUBIN TOTAL: 0.7 mg/dL (ref 0.3–1.2)
BUN: 22 mg/dL — AB (ref 6–20)
CO2: 20 mmol/L — ABNORMAL LOW (ref 22–32)
CREATININE: 1.53 mg/dL — AB (ref 0.44–1.00)
Calcium: 8.8 mg/dL — ABNORMAL LOW (ref 8.9–10.3)
Chloride: 94 mmol/L — ABNORMAL LOW (ref 101–111)
GFR calc Af Amer: 36 mL/min — ABNORMAL LOW (ref >60)
GFR, EST NON AFRICAN AMERICAN: 31 mL/min — AB (ref >60)
GLUCOSE: 155 mg/dL — AB (ref 65–99)
POTASSIUM: 3.9 mmol/L (ref 3.5–5.1)
Sodium: 128 mmol/L — ABNORMAL LOW (ref 135–145)
TOTAL PROTEIN: 6.5 g/dL (ref 6.5–8.1)

## 2015-02-03 LAB — URINALYSIS, ROUTINE W REFLEX MICROSCOPIC
BILIRUBIN URINE: NEGATIVE
Glucose, UA: NEGATIVE mg/dL
KETONES UR: 15 mg/dL — AB
Leukocytes, UA: NEGATIVE
Nitrite: NEGATIVE
PH: 7 (ref 5.0–8.0)
Protein, ur: 100 mg/dL — AB
SPECIFIC GRAVITY, URINE: 1.011 (ref 1.005–1.030)
UROBILINOGEN UA: 0.2 mg/dL (ref 0.0–1.0)

## 2015-02-03 LAB — URINE MICROSCOPIC-ADD ON

## 2015-02-03 LAB — CBC
HEMATOCRIT: 34.7 % — AB (ref 36.0–46.0)
Hemoglobin: 12.2 g/dL (ref 12.0–15.0)
MCH: 28.9 pg (ref 26.0–34.0)
MCHC: 35.2 g/dL (ref 30.0–36.0)
MCV: 82.2 fL (ref 78.0–100.0)
PLATELETS: 232 10*3/uL (ref 150–400)
RBC: 4.22 MIL/uL (ref 3.87–5.11)
RDW: 12.8 % (ref 11.5–15.5)
WBC: 9.4 10*3/uL (ref 4.0–10.5)

## 2015-02-03 LAB — LIPASE, BLOOD: Lipase: 38 U/L (ref 11–51)

## 2015-02-03 LAB — I-STAT CG4 LACTIC ACID, ED: Lactic Acid, Venous: 0.44 mmol/L — ABNORMAL LOW (ref 0.5–2.0)

## 2015-02-03 MED ORDER — MEPERIDINE HCL 25 MG/ML IJ SOLN
25.0000 mg | Freq: Once | INTRAMUSCULAR | Status: AC
  Administered 2015-02-03: 25 mg via INTRAVENOUS
  Filled 2015-02-03: qty 1

## 2015-02-03 MED ORDER — MEPERIDINE HCL 25 MG/ML IJ SOLN
25.0000 mg | INTRAMUSCULAR | Status: DC | PRN
  Administered 2015-02-04: 25 mg via INTRAVENOUS
  Filled 2015-02-03: qty 1

## 2015-02-03 MED ORDER — SODIUM CHLORIDE 0.9 % IV SOLN
INTRAVENOUS | Status: AC
  Administered 2015-02-04: via INTRAVENOUS

## 2015-02-03 MED ORDER — ONDANSETRON HCL 4 MG/2ML IJ SOLN
4.0000 mg | Freq: Three times a day (TID) | INTRAMUSCULAR | Status: DC | PRN
  Administered 2015-02-04: 4 mg via INTRAVENOUS
  Filled 2015-02-03: qty 2

## 2015-02-03 MED ORDER — PROMETHAZINE HCL 25 MG/ML IJ SOLN
12.5000 mg | Freq: Once | INTRAMUSCULAR | Status: AC
  Administered 2015-02-03: 12.5 mg via INTRAVENOUS
  Filled 2015-02-03: qty 1

## 2015-02-03 NOTE — ED Notes (Signed)
MD at bedside. 

## 2015-02-03 NOTE — ED Notes (Signed)
Pt states she had left side sharp pain that radiated to the back 10/10.  Pt was seen here yesterday and had a CT.  Pt states pain has gotten worse with severe Nausea and diarreha.  [facility] started an IV 20 Left AC and 4mg  zophan.  189/90, 81, 99% CBG 163.  Pt took 1/2 hydrocodone with some relief at 1900.

## 2015-02-04 ENCOUNTER — Observation Stay (HOSPITAL_COMMUNITY): Payer: Medicare Other

## 2015-02-04 DIAGNOSIS — I2510 Atherosclerotic heart disease of native coronary artery without angina pectoris: Secondary | ICD-10-CM | POA: Diagnosis not present

## 2015-02-04 DIAGNOSIS — R109 Unspecified abdominal pain: Secondary | ICD-10-CM | POA: Insufficient documentation

## 2015-02-04 DIAGNOSIS — E1169 Type 2 diabetes mellitus with other specified complication: Secondary | ICD-10-CM

## 2015-02-04 DIAGNOSIS — E871 Hypo-osmolality and hyponatremia: Secondary | ICD-10-CM | POA: Diagnosis present

## 2015-02-04 DIAGNOSIS — R1032 Left lower quadrant pain: Secondary | ICD-10-CM | POA: Diagnosis not present

## 2015-02-04 LAB — BASIC METABOLIC PANEL
Anion gap: 13 (ref 5–15)
BUN: 23 mg/dL — AB (ref 6–20)
CO2: 20 mmol/L — ABNORMAL LOW (ref 22–32)
CREATININE: 1.53 mg/dL — AB (ref 0.44–1.00)
Calcium: 8.5 mg/dL — ABNORMAL LOW (ref 8.9–10.3)
Chloride: 97 mmol/L — ABNORMAL LOW (ref 101–111)
GFR calc Af Amer: 36 mL/min — ABNORMAL LOW (ref >60)
GFR, EST NON AFRICAN AMERICAN: 31 mL/min — AB (ref >60)
GLUCOSE: 119 mg/dL — AB (ref 65–99)
POTASSIUM: 3.9 mmol/L (ref 3.5–5.1)
SODIUM: 130 mmol/L — AB (ref 135–145)

## 2015-02-04 LAB — GLUCOSE, CAPILLARY
GLUCOSE-CAPILLARY: 113 mg/dL — AB (ref 65–99)
Glucose-Capillary: 110 mg/dL — ABNORMAL HIGH (ref 65–99)
Glucose-Capillary: 152 mg/dL — ABNORMAL HIGH (ref 65–99)

## 2015-02-04 LAB — CBC
HEMATOCRIT: 33.6 % — AB (ref 36.0–46.0)
Hemoglobin: 11.7 g/dL — ABNORMAL LOW (ref 12.0–15.0)
MCH: 29 pg (ref 26.0–34.0)
MCHC: 34.8 g/dL (ref 30.0–36.0)
MCV: 83.4 fL (ref 78.0–100.0)
Platelets: 211 10*3/uL (ref 150–400)
RBC: 4.03 MIL/uL (ref 3.87–5.11)
RDW: 13.1 % (ref 11.5–15.5)
WBC: 8.1 10*3/uL (ref 4.0–10.5)

## 2015-02-04 MED ORDER — OXYCODONE-ACETAMINOPHEN 5-325 MG PO TABS
1.0000 | ORAL_TABLET | Freq: Four times a day (QID) | ORAL | Status: DC | PRN
  Administered 2015-02-04 (×2): 2 via ORAL
  Administered 2015-02-05: 1 via ORAL
  Filled 2015-02-04: qty 2
  Filled 2015-02-04 (×2): qty 1
  Filled 2015-02-04: qty 2

## 2015-02-04 MED ORDER — SODIUM CHLORIDE 0.9 % IV SOLN: 250.0000 mL | INTRAVENOUS | Status: DC | PRN

## 2015-02-04 MED ORDER — ENOXAPARIN SODIUM 30 MG/0.3ML ~~LOC~~ SOLN
30.0000 mg | SUBCUTANEOUS | Status: DC
  Administered 2015-02-04 – 2015-02-07 (×4): 30 mg via SUBCUTANEOUS
  Filled 2015-02-04 (×4): qty 0.3

## 2015-02-04 MED ORDER — ALUM & MAG HYDROXIDE-SIMETH 200-200-20 MG/5ML PO SUSP: 30.0000 mL | Freq: Four times a day (QID) | ORAL | Status: DC | PRN

## 2015-02-04 MED ORDER — DIPHENHYDRAMINE HCL 50 MG/ML IJ SOLN: 25.0000 mg | Freq: Four times a day (QID) | INTRAMUSCULAR | Status: DC | PRN

## 2015-02-04 MED ORDER — SODIUM CHLORIDE 0.9 % IJ SOLN
3.0000 mL | Freq: Two times a day (BID) | INTRAMUSCULAR | Status: DC
  Administered 2015-02-04 – 2015-02-06 (×4): 3 mL via INTRAVENOUS

## 2015-02-04 MED ORDER — MECLIZINE HCL 25 MG PO TABS
25.0000 mg | ORAL_TABLET | Freq: Three times a day (TID) | ORAL | Status: DC
  Administered 2015-02-04 – 2015-02-07 (×10): 25 mg via ORAL
  Filled 2015-02-04 (×12): qty 1

## 2015-02-04 MED ORDER — DICYCLOMINE HCL 20 MG PO TABS
20.0000 mg | ORAL_TABLET | Freq: Two times a day (BID) | ORAL | Status: DC
Start: 2015-02-04 — End: 2015-02-07
  Administered 2015-02-04 – 2015-02-07 (×6): 20 mg via ORAL
  Filled 2015-02-04 (×8): qty 1

## 2015-02-04 MED ORDER — SIMVASTATIN 20 MG PO TABS
20.0000 mg | ORAL_TABLET | ORAL | Status: DC
  Administered 2015-02-05: 20 mg via ORAL
  Filled 2015-02-04: qty 1

## 2015-02-04 MED ORDER — PANTOPRAZOLE SODIUM 40 MG PO TBEC
40.0000 mg | DELAYED_RELEASE_TABLET | Freq: Every day | ORAL | Status: DC
  Administered 2015-02-04 – 2015-02-07 (×4): 40 mg via ORAL
  Filled 2015-02-04 (×4): qty 1

## 2015-02-04 MED ORDER — MEPERIDINE HCL 25 MG/ML IJ SOLN
25.0000 mg | INTRAMUSCULAR | Status: DC | PRN
  Administered 2015-02-04 (×3): 25 mg via INTRAVENOUS
  Filled 2015-02-04 (×4): qty 1

## 2015-02-04 MED ORDER — ASPIRIN 325 MG PO TABS
325.0000 mg | ORAL_TABLET | Freq: Every day | ORAL | Status: DC
  Administered 2015-02-04 – 2015-02-06 (×4): 325 mg via ORAL
  Filled 2015-02-04 (×4): qty 1

## 2015-02-04 MED ORDER — CLONIDINE HCL 0.1 MG PO TABS
0.1000 mg | ORAL_TABLET | Freq: Two times a day (BID) | ORAL | Status: DC
  Administered 2015-02-04 – 2015-02-07 (×7): 0.1 mg via ORAL
  Filled 2015-02-04 (×8): qty 1

## 2015-02-04 MED ORDER — CARVEDILOL 25 MG PO TABS
25.0000 mg | ORAL_TABLET | Freq: Two times a day (BID) | ORAL | Status: DC
  Administered 2015-02-04 – 2015-02-07 (×7): 25 mg via ORAL
  Filled 2015-02-04 (×8): qty 1

## 2015-02-04 MED ORDER — ONDANSETRON HCL 4 MG PO TABS
4.0000 mg | ORAL_TABLET | Freq: Four times a day (QID) | ORAL | Status: DC | PRN
  Administered 2015-02-05 – 2015-02-06 (×3): 4 mg via ORAL
  Filled 2015-02-04 (×3): qty 1

## 2015-02-04 MED ORDER — INSULIN ASPART 100 UNIT/ML ~~LOC~~ SOLN: 0.0000 [IU] | Freq: Every day | SUBCUTANEOUS | Status: DC

## 2015-02-04 MED ORDER — ONDANSETRON HCL 4 MG/2ML IJ SOLN
4.0000 mg | Freq: Four times a day (QID) | INTRAMUSCULAR | Status: DC | PRN
  Administered 2015-02-04 (×3): 4 mg via INTRAVENOUS
  Filled 2015-02-04 (×3): qty 2

## 2015-02-04 MED ORDER — SODIUM CHLORIDE 0.9 % IJ SOLN: 3.0000 mL | INTRAMUSCULAR | Status: DC | PRN

## 2015-02-04 MED ORDER — INSULIN ASPART 100 UNIT/ML ~~LOC~~ SOLN
0.0000 [IU] | Freq: Three times a day (TID) | SUBCUTANEOUS | Status: DC
  Administered 2015-02-04 – 2015-02-05 (×2): 2 [IU] via SUBCUTANEOUS
  Administered 2015-02-06: 1 [IU] via SUBCUTANEOUS

## 2015-02-04 NOTE — H&P (Signed)
[facility] Admission History and Physical       [name] ACZ:[id number] DOB: 02/16/1932 DOA: 02/03/2015  Referring physician:  PCP: Irven Shelling, MD  Specialists:   Chief Complaint: Left Flank and ABD Pain  HPI: [name] is a 81 y.o. female with a history of CAD, HTN, DM2, RAS S/P Left Renal Stent (12/20/2014) Hyperlipidemia who presents to the ED with complaints of 10/10 sharp Left Sided ABD Pain and Flank Pain x   She has had Nausea but no Vomiting.    She was seen in the ED on 10/17 and had a CT scan of the ABD/Pelvis without contrast which was negative for acute findings.  She was discharged to home on medication for nausea and her pain.   Her BUN/Cr is mildly increased from 10/17, however her hyponatremia has improved from 123 now 128.      Review of Systems:  Constitutional: No Weight Loss, No Weight Gain, Night Sweats, Fevers, Chills, Dizziness, Light Headedness, Fatigue, or Generalized Weakness HEENT: No Headaches, Difficulty Swallowing,Tooth/Dental Problems,Sore Throat,  No Sneezing, Rhinitis, Ear Ache, Nasal Congestion, or Post Nasal Drip,  Cardio-vascular:  No Chest pain, Orthopnea, PND, Edema in Lower Extremities, Anasarca, Dizziness, Palpitations  Resp: No Dyspnea, No DOE, No Productive Cough, No Non-Productive Cough, No Hemoptysis, No Wheezing.    GI: No Heartburn, Indigestion, +Abdominal Pain, +Nausea, Vomiting, Diarrhea, Constipation, Hematemesis, Hematochezia, Melena, Change in Bowel Habits,  Loss of Appetite  GU: No Dysuria, No Change in Color of Urine, No Urgency or Urinary Frequency,  +Left Flank pain.  Musculoskeletal: No Joint Pain or Swelling, No Decreased Range of Motion, No Back Pain.  Neurologic: No Syncope, No Seizures, Muscle Weakness, Paresthesia, Vision Disturbance or Loss, No Diplopia, No Vertigo, No Difficulty Walking,  Skin: No Rash or Lesions. Psych: No Change in Mood or Affect, No Depression or Anxiety, No Memory loss, No  Confusion, or Hallucinations   Past Medical History  Diagnosis Date  . CAD (coronary artery disease)   . Renal artery stenosis ([place])   . HTN (hypertension)   . Dyslipidemia   . DM (diabetes mellitus) ([place])   . GERD (gastroesophageal reflux disease)   . Osteoporosis        Meniere's Disease   Past Surgical History  Procedure Laterality Date  . Right hip fx 2011    . Left wrist fx 2012    . Abdominal hysterectomy    . Cholecystectomy    . Bladder tac      x3  . Cardiac catheterization    . Neck sx    . Breast surgery      breast reduction 1985  . Left renal stent    . Peripheral vascular catheterization Left 12/20/2014    Procedure: Renal Angiography;  Surgeon: Wellington Hampshire, MD;  Location: [facility];  Service: Cardiovascular;  Laterality: Left;      Prior to Admission medications   Medication Sig Start Date End Date Taking? Authorizing Provider  ACCU-CHEK COMPACT PLUS test strip 1 each by Other route See admin instructions. Check blood sugar at bedtime 09/22/14   Historical Provider, MD  ACCU-CHEK SOFTCLIX LANCETS lancets 1 each by Other route See admin instructions. Check blood sugar at bedtime 10/09/14   Historical Provider, MD  aspirin 325 MG tablet Take 325 mg by mouth at bedtime.    Historical Provider, MD  azelastine (ASTELIN) 0.1 % nasal spray SPRAY 1-2 TIMES IN EACH NOSTRIL TWICE A DAY AS NEEDED FOR CONGESTION  12/06/14   Historical Provider, MD  betamethasone dipropionate (DIPROLENE) 0.05 % cream Apply 1 application topically daily as needed (irritated skin).    Historical Provider, MD  Calcium Citrate (CITRACAL PO) Take 1,200 mg by mouth daily.    Historical Provider, MD  carvedilol (COREG) 25 MG tablet Take 25 mg by mouth 2 (two) times daily with a meal.    Historical Provider, MD  cholecalciferol (VITAMIN D) 1000 UNITS tablet Take 2,000 Units by mouth daily.     Historical Provider, MD  cloNIDine (CATAPRES) 0.1 MG tablet Take 1 tablet (0.1 mg total) by  mouth 2 (two) times daily. 01/02/15 01/18/16  Wellington Hampshire, MD  Cod Liver Oil CAPS Take 1 capsule by mouth daily.    Historical Provider, MD  dicyclomine (BENTYL) 20 MG tablet Take 1 tablet (20 mg total) by mouth 2 (two) times daily. 01/30/15   Kaitlyn Szekalski, PA-C  esomeprazole (NEXIUM) 40 MG capsule Take 40 mg by mouth 2 (two) times daily.    Historical Provider, MD  fexofenadine (ALLEGRA) 180 MG tablet Take 180 mg by mouth daily.    Historical Provider, MD  meclizine (ANTIVERT) 25 MG tablet Take 25 mg by mouth 3 (three) times daily.     Historical Provider, MD  Multiple Vitamins-Minerals (PRESERVISION AREDS) CAPS Take 1 capsule by mouth 2 (two) times daily.     Historical Provider, MD  nitroGLYCERIN (NITROSTAT) 0.4 MG SL tablet Place 0.4 mg under the tongue every 5 (five) minutes as needed for chest pain (3 doses MAX).    Historical Provider, MD  olmesartan (BENICAR) 40 MG tablet Take 40 mg by mouth daily.    Historical Provider, MD  PREMARIN vaginal cream Place 1-2 g vaginally 2 (two) times a week. 10/06/14   Historical Provider, MD  promethazine (PHENERGAN) 25 MG tablet Take 0.5 tablets (12.5 mg total) by mouth every 6 (six) hours as needed for nausea or vomiting. 01/30/15   Kaitlyn Szekalski, PA-C  simvastatin (ZOCOR) 20 MG tablet Take 20 mg by mouth daily.  10/14/11   Historical Provider, MD  triamterene-hydrochlorothiazide (MAXZIDE-25) 37.5-25 MG per tablet Take 0.5 tablets by mouth daily. 12/29/14   Wellington Hampshire, MD  VITAMIN E PO Take 1 capsule by mouth daily.    Historical Provider, MD     Allergies  Allergen Reactions  . Fentanyl Anaphylaxis  . Morphine And Related Anaphylaxis  . Actamin [Acetaminophen] Rash  . Actonel [Risedronate Sodium] Rash  . Amlodipine     diarrhea  . Avapro [Irbesartan] Rash  . Cardio Complete [Nutritional Supplements] Rash  . Cardizem [Diltiazem Hcl] Rash  . Ciprofloxacin Rash  . Codeine Rash  . Dilaudid [Hydromorphone Hcl] Rash  . Forteo  [Teriparatide (Recombinant)] Rash  . Fosamax [Alendronate Sodium] Rash  . Hydralazine Diarrhea  . Inapsine [Droperidol] Rash  . Ivp Dye [Iodinated Diagnostic Agents] Rash  . Lipitor [Atorvastatin] Rash  . Lisinopril Rash  . Micardis [Telmisartan] Rash  . Septra [Sulfamethoxazole-Trimethoprim] Rash  . Shellfish Allergy Rash  . Tekturna [Aliskiren] Rash  . Tricor [Fenofibrate] Rash  . Tussionex Pennkinetic Er [Hydrocod Polst-Cpm Polst Er] Rash    Social History:  reports that she has quit smoking. She has never used smokeless tobacco. She reports that she does not drink alcohol or use illicit drugs.    Family History  Problem Relation Age of Onset  . Diabetes type II Mother   . Heart attack Mother   . Heart disease Mother   . Congestive Heart Failure  Father   . Pulmonary embolism Father   . Alzheimer's disease Sister   . Heart disease Sister   . Heart disease Brother   . Healthy Sister   . Healthy Sister   . Stroke Brother   . Dementia Brother     VASCULAR  . Stroke Brother   . Diabetes type II Brother   . Other Brother     KILLED IN PLANE CRASH  . Pulmonary disease Sister   . Heart attack Sister   . Kidney failure Sister   . Diabetes type II Sister   . Diabetes type II Sister   . Kidney failure Sister        Physical Exam:  GEN:  Pleasant Elderly 81 y.o. Caucasian female examined and in no acute distress; cooperative with exam Filed Vitals:   02/03/15 2215 02/03/15 2245 02/03/15 2315 02/03/15 2330  BP: 157/66 138/50 166/45 149/56  Pulse: 63 60 63 62  Temp:      TempSrc:      Resp: 14 18 23 15   SpO2: 94% 97% 95% 96%   Blood pressure 149/56, pulse 62, temperature 97.6 F (36.4 C), temperature source Oral, resp. rate 15, SpO2 96 %. PSYCH: She is alert and oriented x4; does not appear anxious does not appear depressed; affect is normal HEENT: Normocephalic and Atraumatic, Mucous membranes pink; PERRLA; EOM intact; Fundi:  Benign;  No scleral icterus, Nares:  Patent, Oropharynx: Clear,    Neck:  FROM, No Cervical Lymphadenopathy nor Thyromegaly or Carotid Bruit; No JVD; Breasts:: Not examined CHEST WALL: No tenderness CHEST: Normal respiration, clear to auscultation bilaterally HEART: Regular rate and rhythm; no murmurs rubs or gallops BACK: No kyphosis or scoliosis; No CVA tenderness ABDOMEN: Positive Bowel Sounds,  Soft Non-Tender, No Rebound or Guarding; No Masses, No Organomegaly Rectal Exam: Not done EXTREMITIES: No Cyanosis, Clubbing, or Edema; No Ulcerations. Genitalia: not examined PULSES: 2+ and symmetric SKIN: Normal hydration no rash or ulceration CNS:  Alert and Oriented x 4, No Focal Deficits Vascular: pulses palpable throughout    Labs on Admission:  Basic Metabolic Panel:  Recent Labs Lab 01/29/15 1808 02/03/15 2019  NA 123* 128*  K 4.2 3.9  CL 90* 94*  CO2 23 20*  GLUCOSE 126* 155*  BUN 23* 22*  CREATININE 1.38* 1.53*  CALCIUM 8.5* 8.8*   Liver Function Tests:  Recent Labs Lab 02/03/15 2019  AST 23  ALT 16  ALKPHOS 58  BILITOT 0.7  PROT 6.5  ALBUMIN 3.3*    Recent Labs Lab 02/03/15 2019  LIPASE 38   No results for input(s): AMMONIA in the last 168 hours. CBC:  Recent Labs Lab 01/29/15 1808 02/03/15 2019  WBC 7.2 9.4  HGB 12.3 12.2  HCT 35.7* 34.7*  MCV 84.2 82.2  PLT 246 232   Cardiac Enzymes: No results for input(s): CKTOTAL, CKMB, CKMBINDEX, TROPONINI in the last 168 hours.  BNP (last 3 results) No results for input(s): BNP in the last 8760 hours.  ProBNP (last 3 results) No results for input(s): PROBNP in the last 8760 hours.  CBG: No results for input(s): GLUCAP in the last 168 hours.  Radiological Exams on Admission: No results found.     Assessment/Plan:     81 y.o. female with  Active Problems:   1.     LLQ abdominal pain/ Left flank pain   PRN IV Demerol   PRN Zofran   IVFs   Renal US   May Need Urology consultation  2.     Hyponatremia- due to Thiazide     Hold Triamterene/HCTZ   IVFs with NSS   Monitor Na+ Level     3.    AKI   Hold Triamterene/HCTZ and Benicar Rx   Monitor BUN/Cr     4.     Renal artery stenosis (H[facility])   Hx of Left Renal Artery Stent     5.     DM (diabetes mellitus) ([facility])   SSI coverage PRN   Check HbA1C     6.     CAD (coronary artery disease)   Continue Carvedilol Rx        7.     HTN (hypertension)   Hold Triamterene/HCTZ and Benicar Rx   Continue Carvedilol and Clonidine Rx   Monitor BPs     8.     Hyperlipidemia   Continue Simvastatin Rx     9.     DVT Prophylaxis   Lovenox     Code Status:     FULL CODE        Family Communication:   Family at Bedside    Disposition Plan:   Observation Status        Time spent:  23 Minutes      Theressa Millard [facility] Pager [phone]   If [place] Please Contact the Day Rounding Team MD for [facility]  If 7PM-7AM, Please Contact Night-Floor Coverage  [email or web address] Password [email or web address] 02/04/2015, 12:00 AM     ADDENDUM:   Patient was seen and examined on 02/04/2015

## 2015-02-04 NOTE — Progress Notes (Signed)
Patient admitted after midnight. Chart reviewed. Patient examined. Await renal US. If negative, likely musculoskeletal. Has healing bruise left buttock/flank area.  Doree Barthel, MD [facility]

## 2015-02-05 ENCOUNTER — Encounter (HOSPITAL_COMMUNITY): Payer: Self-pay | Admitting: Physical Therapy

## 2015-02-05 ENCOUNTER — Telehealth: Payer: Self-pay | Admitting: Cardiovascular Disease

## 2015-02-05 DIAGNOSIS — E871 Hypo-osmolality and hyponatremia: Secondary | ICD-10-CM | POA: Diagnosis not present

## 2015-02-05 DIAGNOSIS — R1032 Left lower quadrant pain: Secondary | ICD-10-CM | POA: Diagnosis not present

## 2015-02-05 DIAGNOSIS — E785 Hyperlipidemia, unspecified: Secondary | ICD-10-CM | POA: Diagnosis not present

## 2015-02-05 DIAGNOSIS — I150 Renovascular hypertension: Secondary | ICD-10-CM | POA: Diagnosis not present

## 2015-02-05 DIAGNOSIS — R109 Unspecified abdominal pain: Secondary | ICD-10-CM

## 2015-02-05 LAB — GLUCOSE, CAPILLARY
GLUCOSE-CAPILLARY: 153 mg/dL — AB (ref 65–99)
Glucose-Capillary: 97 mg/dL (ref 65–99)
Glucose-Capillary: 110 mg/dL — ABNORMAL HIGH (ref 65–99)
Glucose-Capillary: 116 mg/dL — ABNORMAL HIGH (ref 65–99)

## 2015-02-05 LAB — HEMOGLOBIN A1C
HEMOGLOBIN A1C: 6.5 % — AB (ref 4.8–5.6)
MEAN PLASMA GLUCOSE: 140 mg/dL

## 2015-02-05 MED ORDER — OXYCODONE-ACETAMINOPHEN 5-325 MG PO TABS
1.0000 | ORAL_TABLET | Freq: Four times a day (QID) | ORAL | Status: DC | PRN
  Administered 2015-02-05 – 2015-02-07 (×3): 1 via ORAL
  Filled 2015-02-05 (×3): qty 1

## 2015-02-05 MED ORDER — ACETAMINOPHEN 325 MG PO TABS
650.0000 mg | ORAL_TABLET | Freq: Three times a day (TID) | ORAL | Status: DC
  Administered 2015-02-05 – 2015-02-07 (×6): 650 mg via ORAL
  Filled 2015-02-05 (×6): qty 2

## 2015-02-05 MED ORDER — CYCLOBENZAPRINE HCL 10 MG PO TABS
5.0000 mg | ORAL_TABLET | Freq: Three times a day (TID) | ORAL | Status: DC | PRN
  Administered 2015-02-05: 5 mg via ORAL
  Filled 2015-02-05: qty 1

## 2015-02-05 NOTE — Progress Notes (Signed)
02/05/2015 PT evaluation completed.  Pt mildly unsteady on her feet and would benefit from HHPT for balance and gait training at home.  Complete PT note to follow in chart.  RN CM notified and arranging d/c needs.  Thanks,  Barbarann Ehlers. [place], [place], DPT [phone]

## 2015-02-05 NOTE — Telephone Encounter (Signed)
I will forward this message to Dr Fletcher Anon to make him aware.

## 2015-02-05 NOTE — Evaluation (Signed)
Physical Therapy Evaluation Patient Details Name: [name] MRN: [id number] DOB: March 27, 1932 Today's Date: 02/05/2015   History of Present Illness  81 y.o. female admitted to [facility] on 02/03/15 for left sided flank pain, , and hyponatremia.  Medical workup in progress.  Pt with significant PMHx of Meniere's Disease (takes antivert 3 times per day at home), CAD, renal artery stenosis s/p stenting on the left, HTN, DM, R hip fx, L wrist fx, and neck surgery with very limited ROM at her neck.    Clinical Impression  Pt is mildly unsteady on her feet and at high risk of falls.  She became nauseated at the end of the session, but I am not sure if it was all the turns we were preforming during our DGI assessment and her hx. Of Meniere's Disease and vertigo related to that condition.  She would benefit from some balance and gait training at discharge.   PT to follow acutely for deficits listed below.       Follow Up Recommendations Home health PT    Equipment Recommendations  None recommended by PT    Recommendations for Other Services   NA    Precautions / Restrictions Precautions Precautions: Fall Precaution Comments: pt is mildly unsteady on her feet.       Mobility  Bed Mobility Overal bed mobility: Modified Independent             General bed mobility comments: HOB flat, pt using railing for leverage  Transfers Overall transfer level: Needs assistance Equipment used: None Transfers: Sit to/from Stand Sit to Stand: Supervision         General transfer comment: supervision for safety due to heavy use of bil upper extremities for transitions  Ambulation/Gait Ambulation/Gait assistance: Min guard;Min assist Ambulation Distance (Feet): 150 Feet Assistive device: None Gait Pattern/deviations: Step-through pattern;Staggering left;Staggering right     General Gait Details: pt with mildly staggering gait pattern that at the beginning of gait required min assist to prevent  LOB/fall when headed into the bathroom (floor uneaven in the bathroom), however, as gait progressed she did improve until she started to feel nauseated and her left flank started hurting again.    Stairs Stairs: Yes Stairs assistance: Min guard Stair Management: No rails;Forwards Number of Stairs: 1 General stair comments: min guard assist for safety and balance.           Balance Overall balance assessment: Needs assistance Sitting-balance support: Feet supported;No upper extremity supported Sitting balance-Leahy Scale: Good Sitting balance - Comments: pt able to donn bil shoes seated EOB without LOB or safety issues   Standing balance support: No upper extremity supported;Single extremity supported Standing balance-Leahy Scale: Good                   Standardized Balance Assessment Standardized Balance Assessment : Dynamic Gait Index   Dynamic Gait Index Level Surface: Mild Impairment Change in Gait Speed: Mild Impairment Gait with Horizontal Head Turns: Normal (limited neck ROM with this task) Gait with Vertical Head Turns: Normal (limited neck ROM with this task) Gait and Pivot Turn: Mild Impairment Step Over Obstacle: Severe Impairment Step Around Obstacles:  (pt became nauseated and had to stop) Steps:  (only tested one step)       Pertinent Vitals/Pain Pain Assessment: No/denies pain (at beginning of session, by the end, 2/10 left flank pain)    Home Living Family/patient expects to be discharged to:: Private residence Living Arrangements: Spouse/significant other Available Help at  Discharge: Family;Available 24 hours/day Type of Home: House Home Access: Stairs to enter Entrance Stairs-Rails: None Entrance Stairs-Number of Steps: 1 Home Layout: One level Home Equipment: None;Walker - 2 wheels;Cane - single point;Bedside commode;Shower seat      Prior Function Level of Independence: Independent         Comments: pt reports being independent  without AD PTA.  She reports no h/o falls, yet her PMHx in her chart have a wrist and hip fx in it in 2011 and 2012     Hand Dominance   Dominant Hand: Right    Extremity/Trunk Assessment   Upper Extremity Assessment: Overall WFL for tasks assessed           Lower Extremity Assessment: Generalized weakness (4/5 throughout per seated MMT)      Cervical / Trunk Assessment: Other exceptions  Communication   Communication: No difficulties  Cognition Arousal/Alertness: Awake/alert Behavior During Therapy: WFL for tasks assessed/performed Overall Cognitive Status: Within Functional Limits for tasks assessed                      General Comments General comments (skin integrity, edema, etc.): Pt became nauseated at end of session with increase in left flank pain.  RN made aware.           Assessment/Plan    PT Assessment Patient needs continued PT services  PT Diagnosis Difficulty walking;Abnormality of gait;Generalized weakness;Acute pain   PT Problem List Decreased strength;Decreased activity tolerance;Decreased balance;Decreased mobility;Decreased knowledge of use of DME  PT Treatment Interventions DME instruction;Gait training;Stair training;Therapeutic activities;Functional mobility training;Therapeutic exercise;Balance training;Neuromuscular re-education;Patient/family education   PT Goals (Current goals can be found in the Care Plan section) Acute Rehab PT Goals Patient Stated Goal: to feel better, go back home PT Goal Formulation: With patient Time For Goal Achievement: 02/19/15 Potential to Achieve Goals: Good    Frequency Min 3X/week           End of Session   Activity Tolerance: Other (comment);Patient limited by pain (limited by nausea ) Patient left: in bed;with call bell/phone within reach;with family/visitor present Nurse Communication: Other (comment) (nausea and pain)    Functional Assessment Tool Used: assist level Functional  Limitation: Mobility: Walking and moving around Mobility: Walking and Moving Around Current Status (O0321): At least 1 percent but less than 20 percent impaired, limited or restricted Mobility: Walking and Moving Around Goal Status [phone]): 0 percent impaired, limited or restricted    Time: 5003-7048 PT Time Calculation (min) (ACUTE ONLY): 24 min   Charges:    1 EV, 1 Gait     PT G Codes:   PT G-Codes **NOT FOR INPATIENT CLASS** Functional Assessment Tool Used: assist level Functional Limitation: Mobility: Walking and moving around Mobility: Walking and Moving Around Current Status (G8916): At least 1 percent but less than 20 percent impaired, limited or restricted Mobility: Walking and Moving Around Goal Status [phone]): 0 percent impaired, limited or restricted    Abir Eroh B. [place], [place], DPT [phone]   02/05/2015, 11:22 PM

## 2015-02-05 NOTE — Progress Notes (Signed)
PROGRESS NOTE    [name]:[id number] DOB: 01-15-1932 DOA: 02/03/2015 PCP: Irven Shelling, MD  HPI/Brief narrative 81 y.o. female with a history of CAD, HTN, DM2, RAS S/P Left Renal Stent 2012 with recent angiography showed occluded left renal artery 12/20/14-no intervention done  Hyperlipidemia who presents to the ED with complaints of 10/10 sharp Left Sided ABD Pain and Flank Pain.She has had Nausea but no Vomiting.She was seen in the ED on 10/17 and had a CT scan of the ABD/Pelvis without contrast which was negative for acute findings.She was discharged to home on medication for nausea and her pain.  Assessment/Plan:  LLQ abdominal pain/left flank pain - Unclear etiology - CT abdomen and pelvis without contrast 10/18 showed no acute abnormalities to explain patient's symptoms. Scattered diverticulosis along the descending and sigmoid colon without evidence of diverticulitis. No diarrhea reported. - Renal ultrasound: No acute abnormalities - Urine microscopy: Not impressive for UTI. - Patient's pain seemed to have resolved when evaluated this morning and patient was even anxious to go home. However after ambulating with PT, she had recurrence of pain. - Patient has healing bruise in the left buttock/flank area. Etiology of pain? Musculoskeletal? Related to recent stent placement. Discussed with interventional cardiologist/Dr. Fletcher Anon: Does not think that the pain is stent related which was done in 2012, only had recent angiography 6 weeks ago and there was no new stent placement. Question of bladder mass on last visit with him but CT abdomen without contrast was negative.? Musculoskeletal. We will add muscle relaxants, scheduled acetaminophen, K pad and monitor.  Hyponatremia - Secondary to thiazide diuretics. Discontinued. Follow BMP in a.m.  Stage III chronic kidney disease - Creatinine has been in the 1.6 range since August 2016. At baseline.  Renovascular  hypertension - Blood pressures are reasonably controlled on carvedilol and clonidine. Thiazides discontinued secondary to hyponatremia  HLD - Statins  Type II DM - SSI   Left renal artery stenosis - Status post stent in 2012. Occluded by recent angiogram.  Anemia - Follow CBCs   DVT prophylaxis: Lovenox  Code Status: Full  Family Communication: Discussed with spouse at bedside on 10/24  Disposition Plan: DC home when medically stable   Consultants:  None   Procedures:  None   Antibiotics:  None   Subjective: When seen this morning, stated that she had no further pain since she last took her pain medications/Percocet at 6:30 PM on 10/23. She seemed anxious to go home. Denied nausea, vomiting or diarrhea. Having normal BMs. Subsequently per RN, when PT ambulated patient, she started having 6/10 LLQ abdominal pain and some dizziness.   Objective: Filed Vitals:   02/04/15 1436 02/04/15 2022 02/05/15 0637 02/05/15 1101  BP: 153/53 109/48 163/73 107/47  Pulse: 77 56 69 54  Temp: 98.2 F (36.8 C) 97.7 F (36.5 C) 98 F (36.7 C)   TempSrc: Oral Oral Oral   Resp: 15 17 17    SpO2: 96% 94% 97% 96%   No intake or output data in the 24 hours ending 02/05/15 1335 There were no vitals filed for this visit.   Exam:  General exam: Elderly frail female patient lying comfortably supine in bed this morning. Respiratory system: Clear. No increased work of breathing. Cardiovascular system: S1 & S2 heard, RRR. No JVD, murmurs, gallops, clicks or pedal edema. Gastrointestinal system: Abdomen is nondistended, soft and nontender. Normal bowel sounds heard. Central nervous system: Alert and oriented. No focal neurological deficits. Extremities: Symmetric 5 x 5  power.   Data Reviewed: Basic Metabolic Panel:  Recent Labs Lab 01/29/15 1808 02/03/15 2019 02/04/15 0445  NA 123* 128* 130*  K 4.2 3.9 3.9  CL 90* 94* 97*  CO2 23 20* 20*  GLUCOSE 126* 155* 119*  BUN 23* 22*  23*  CREATININE 1.38* 1.53* 1.53*  CALCIUM 8.5* 8.8* 8.5*   Liver Function Tests:  Recent Labs Lab 02/03/15 2019  AST 23  ALT 16  ALKPHOS 58  BILITOT 0.7  PROT 6.5  ALBUMIN 3.3*    Recent Labs Lab 02/03/15 2019  LIPASE 38   No results for input(s): AMMONIA in the last 168 hours. CBC:  Recent Labs Lab 01/29/15 1808 02/03/15 2019 02/04/15 0445  WBC 7.2 9.4 8.1  HGB 12.3 12.2 11.7*  HCT 35.7* 34.7* 33.6*  MCV 84.2 82.2 83.4  PLT 246 232 211   Cardiac Enzymes: No results for input(s): CKTOTAL, CKMB, CKMBINDEX, TROPONINI in the last 168 hours. BNP (last 3 results) No results for input(s): PROBNP in the last 8760 hours. CBG:  Recent Labs Lab 02/04/15 0641 02/04/15 1153 02/04/15 1648 02/05/15 0640 02/05/15 1108  GLUCAP 110* 152* 113* 97 153*    No results found for this or any previous visit (from the past 240 hour(s)).        Studies: US Renal  02/04/2015  CLINICAL DATA:  Left flank pain for 1 day EXAM: RENAL / URINARY TRACT ULTRASOUND COMPLETE COMPARISON:  01/30/2015 FINDINGS: Right Kidney: Length: 10.4 cm. Mild cortical thinning is noted. An exophytic cyst is noted arising from the upper pole of the right kidney measuring 1.6 cm. This is similar to changes on recent CT examination. Left Kidney: Length: 9.1 cm. Cortical thinning is noted. Multiple cysts are seen again similar to that noted on prior CT examination. Bladder: Appears normal for degree of bladder distention. IMPRESSION: Bilateral renal cysts.  No acute abnormality is noted. Electronically Signed   By: Inez Catalina M.D.   On: 02/04/2015 17:00        Scheduled Meds: . aspirin  325 mg Oral QHS  . carvedilol  25 mg Oral BID WC  . cloNIDine  0.1 mg Oral BID  . dicyclomine  20 mg Oral BID  . enoxaparin (LOVENOX) injection  30 mg Subcutaneous Q24H  . insulin aspart  0-5 Units Subcutaneous QHS  . insulin aspart  0-9 Units Subcutaneous TID WC  . meclizine  25 mg Oral TID  . pantoprazole  40  mg Oral Daily  . simvastatin  20 mg Oral Q M,W,F-1800  . sodium chloride  3 mL Intravenous Q12H   Continuous Infusions:   Active Problems:   Renal artery stenosis (HCC)   DM (diabetes mellitus) (HCC)   CAD (coronary artery disease)   HTN (hypertension)   Hyperlipidemia   LLQ abdominal pain   Left flank pain   Hyponatremia    Time spent: 25 minutes.    Vernell Leep, MD, FACP, FHM. [facility] Pager [phone]  If 7PM-7AM, please contact night-coverage [email or web address] Password [email or web address] 02/05/2015, 1:35 PM    LOS: 2 days

## 2015-02-05 NOTE — Care Management Note (Signed)
Case Management Note  Patient Details  Name: [name] MRN: [id number] Date of Birth: 22-May-1931  Subjective/Objective:                    Action/Plan: Case manager was asked to arrange for Home health for patient. Spoke with patient and husband concerning Home health needs. Choice was offered. Referral was called to [place] . Mr. Shankland states they have all needed DME. Case manager will continue to monitor.   Expected Discharge Date:  02/04/15               Expected Discharge Plan:   Home Health PT  In-House Referral:     Discharge planning Services  CM Consult  Post Acute Care Choice:  Home Health Choice offered to:  Patient, Spouse  DME Arranged:   NA DME Agency:     HH Arranged:  PT [place]:  [place]  Status of Service:  In process, will continue to follow  Medicare Important Message Given:    Date Medicare IM Given:    Medicare IM give by:    Date Additional Medicare IM Given:    Additional Medicare Important Message give by:     If discussed at [place] of Stay Meetings, dates discussed:    Additional Comments:  Ninfa Meeker, RN 02/05/2015, 2:42 PM

## 2015-02-05 NOTE — Telephone Encounter (Signed)
Ok. I discussed her case with the hospitalist.

## 2015-02-05 NOTE — Telephone Encounter (Signed)
New problem   Pt want to let Dr Fletcher Anon know that she is in the hospital::FYI

## 2015-02-06 ENCOUNTER — Ambulatory Visit: Payer: Medicare Other | Admitting: Cardiovascular Disease

## 2015-02-06 ENCOUNTER — Observation Stay (HOSPITAL_COMMUNITY): Payer: Medicare Other

## 2015-02-06 DIAGNOSIS — R1032 Left lower quadrant pain: Secondary | ICD-10-CM | POA: Diagnosis not present

## 2015-02-06 DIAGNOSIS — I150 Renovascular hypertension: Secondary | ICD-10-CM | POA: Diagnosis not present

## 2015-02-06 DIAGNOSIS — N179 Acute kidney failure, unspecified: Secondary | ICD-10-CM

## 2015-02-06 DIAGNOSIS — E871 Hypo-osmolality and hyponatremia: Secondary | ICD-10-CM | POA: Diagnosis not present

## 2015-02-06 DIAGNOSIS — R109 Unspecified abdominal pain: Secondary | ICD-10-CM | POA: Diagnosis not present

## 2015-02-06 DIAGNOSIS — N183 Chronic kidney disease, stage 3 (moderate): Secondary | ICD-10-CM

## 2015-02-06 LAB — CBC
HEMATOCRIT: 35.4 % — AB (ref 36.0–46.0)
HEMOGLOBIN: 11.8 g/dL — AB (ref 12.0–15.0)
MCH: 28.4 pg (ref 26.0–34.0)
MCHC: 33.3 g/dL (ref 30.0–36.0)
MCV: 85.1 fL (ref 78.0–100.0)
Platelets: 230 10*3/uL (ref 150–400)
RBC: 4.16 MIL/uL (ref 3.87–5.11)
RDW: 13.4 % (ref 11.5–15.5)
WBC: 6.2 10*3/uL (ref 4.0–10.5)

## 2015-02-06 LAB — GLUCOSE, CAPILLARY
GLUCOSE-CAPILLARY: 116 mg/dL — AB (ref 65–99)
GLUCOSE-CAPILLARY: 146 mg/dL — AB (ref 65–99)
Glucose-Capillary: 93 mg/dL (ref 65–99)
Glucose-Capillary: 127 mg/dL — ABNORMAL HIGH (ref 65–99)

## 2015-02-06 LAB — BASIC METABOLIC PANEL
ANION GAP: 7 (ref 5–15)
BUN: 25 mg/dL — ABNORMAL HIGH (ref 6–20)
CALCIUM: 8.4 mg/dL — AB (ref 8.9–10.3)
CHLORIDE: 96 mmol/L — AB (ref 101–111)
CO2: 27 mmol/L (ref 22–32)
Creatinine, Ser: 1.74 mg/dL — ABNORMAL HIGH (ref 0.44–1.00)
GFR calc Af Amer: 31 mL/min — ABNORMAL LOW (ref >60)
GFR calc non Af Amer: 27 mL/min — ABNORMAL LOW (ref >60)
GLUCOSE: 117 mg/dL — AB (ref 65–99)
Potassium: 3.7 mmol/L (ref 3.5–5.1)
Sodium: 130 mmol/L — ABNORMAL LOW (ref 135–145)

## 2015-02-06 MED ORDER — SODIUM CHLORIDE 0.9 % IV SOLN
INTRAVENOUS | Status: AC
  Administered 2015-02-06 – 2015-02-07 (×2): via INTRAVENOUS

## 2015-02-06 MED ORDER — SODIUM CHLORIDE 0.9 % IV BOLUS (SEPSIS): 500.0000 mL | Freq: Once | INTRAVENOUS | Status: DC

## 2015-02-06 MED ORDER — DOCUSATE SODIUM 100 MG PO CAPS
100.0000 mg | ORAL_CAPSULE | Freq: Two times a day (BID) | ORAL | Status: DC
  Administered 2015-02-06 – 2015-02-07 (×2): 100 mg via ORAL
  Filled 2015-02-06 (×2): qty 1

## 2015-02-06 MED ORDER — LIDOCAINE 5 % EX PTCH
1.0000 | MEDICATED_PATCH | CUTANEOUS | Status: DC
  Administered 2015-02-06 – 2015-02-07 (×2): 1 via TRANSDERMAL
  Filled 2015-02-06 (×2): qty 1

## 2015-02-06 NOTE — Progress Notes (Signed)
PROGRESS NOTE    [name] OMV:[id number] DOB: March 12, 1932 DOA: 02/03/2015 PCP: Irven Shelling, MD  HPI/Brief narrative 81 y.o. female with a history of CAD, HTN, DM2, RAS S/P Left Renal Stent 2012 with recent angiography showed occluded left renal artery 12/20/14-no intervention done  Hyperlipidemia who presents to the ED with complaints of 10/10 sharp Left Sided ABD Pain and Flank Pain.She has had Nausea but no Vomiting.She was seen in the ED on 10/17 and had a CT scan of the ABD/Pelvis without contrast which was negative for acute findings.She was discharged to home on medication for nausea and her pain.  Assessment/Plan:  LLQ abdominal pain/left flank pain - Unclear etiology.? Musculoskeletal. - CT abdomen and pelvis without contrast 10/18 showed no acute abnormalities to explain patient's symptoms. Scattered diverticulosis along the descending and sigmoid colon without evidence of diverticulitis. No diarrhea reported. - Renal ultrasound: No acute abnormalities - Urine microscopy: Not impressive for UTI. - Patient has healing bruise in the left buttock/flank area. Etiology of pain? Musculoskeletal? Related to recent stent placement. Discussed with interventional cardiologist/Dr. Fletcher Anon on 10/24: Does not think that the pain is stent related which was done in 2012, only had recent angiography 6 weeks ago and there was no new stent placement. Question of bladder mass on last visit with him but CT abdomen without contrast was negative. - added muscle relaxants, scheduled acetaminophen, K pad and monitor. - Continue to complain of intermittent 10/10 pain-shows area just above the iliac crest in LLQ and left flank. Obtained x-ray of pelvis which shows no fractures. X-ray of abdomen to look for constipation-no acute findings but shows stool and gas in colon. Start Colace. Add lidocaine patch.  Hyponatremia - Secondary to thiazide diuretics. Discontinued. Stable.  Acute on Stage III  chronic kidney disease - Creatinine has been in the 1.6 range since August 2016. Creatinine has worsened to 1.74 from 1.5 on 10/23. Brief IV fluids and follow BMP in a.m.  Renovascular hypertension - on carvedilol and clonidine. Thiazides discontinued secondary to hyponatremia - Intermittent fluctuations  HLD - Statins  Type II DM - SSI   Left renal artery stenosis - Status post stent in 2012. Occluded by recent angiogram.  Anemia - Stable   DVT prophylaxis: Lovenox  Code Status: Full  Family Communication: Discussed with spouse at bedside on 10/25  Disposition Plan: DC home possibly 10/26   Consultants:  None   Procedures:  None   Antibiotics:  None   Subjective: Continues to complain of intermittent 10/10 left flank and left lower quadrant (indicates area just above the iliac crest) pain. Last BM 3 days ago-prior to that had a day of diarrhea due to MiraLAX. Intermittent nausea but no vomiting. Flatus +. States that she had a colonoscopy by Dr. Hassell Done Johnson-couple years ago.  Objective: Filed Vitals:   02/05/15 1700 02/05/15 2100 02/06/15 0500 02/06/15 1300  BP: 144/64 174/53 180/51 125/42  Pulse: 56 66 62 54  Temp: 98.1 F (36.7 C) 97.7 F (36.5 C) 97.7 F (36.5 C) 97.5 F (36.4 C)  TempSrc: Oral Oral  Oral  Resp: 16 18 20 20   Height: 5\' 1"  (1.549 m)     Weight: 56.337 kg (124 lb 3.2 oz)     SpO2: 96% 96% 96% 100%    Intake/Output Summary (Last 24 hours) at 02/06/15 1428 Last data filed at 02/06/15 1300  Gross per 24 hour  Intake    720 ml  Output      0  ml  Net    720 ml   Filed Weights   02/05/15 1700  Weight: 56.337 kg (124 lb 3.2 oz)     Exam:  General exam: Elderly frail female patient sitting up comfortably on chair this morning. Respiratory system: Clear. No increased work of breathing. Cardiovascular system: S1 & S2 heard, RRR. No JVD, murmurs, gallops, clicks or pedal edema. Gastrointestinal system: Abdomen is nondistended,  soft. Mild tenderness in the lower part of the left lower quadrant just above the iliac crest. No rigidity, guarding or rebound. Hernial orifices appear normal. Normal bowel sounds heard. Central nervous system: Alert and oriented. No focal neurological deficits. Extremities: Symmetric 5 x 5 power. Musculoskeletal system: Left flank tenderness. No rash.   Data Reviewed: Basic Metabolic Panel:  Recent Labs Lab 02/03/15 2019 02/04/15 0445 02/06/15 0610  NA 128* 130* 130*  K 3.9 3.9 3.7  CL 94* 97* 96*  CO2 20* 20* 27  GLUCOSE 155* 119* 117*  BUN 22* 23* 25*  CREATININE 1.53* 1.53* 1.74*  CALCIUM 8.8* 8.5* 8.4*   Liver Function Tests:  Recent Labs Lab 02/03/15 2019  AST 23  ALT 16  ALKPHOS 58  BILITOT 0.7  PROT 6.5  ALBUMIN 3.3*    Recent Labs Lab 02/03/15 2019  LIPASE 38   No results for input(s): AMMONIA in the last 168 hours. CBC:  Recent Labs Lab 02/03/15 2019 02/04/15 0445 02/06/15 0610  WBC 9.4 8.1 6.2  HGB 12.2 11.7* 11.8*  HCT 34.7* 33.6* 35.4*  MCV 82.2 83.4 85.1  PLT 232 211 230   Cardiac Enzymes: No results for input(s): CKTOTAL, CKMB, CKMBINDEX, TROPONINI in the last 168 hours. BNP (last 3 results) No results for input(s): PROBNP in the last 8760 hours. CBG:  Recent Labs Lab 02/05/15 1108 02/05/15 1713 02/05/15 2202 02/06/15 0611 02/06/15 1153  GLUCAP 153* 110* 116* 116* 146*    No results found for this or any previous visit (from the past 240 hour(s)).        Studies: Dg Pelvis 1-2 Views  02/06/2015  CLINICAL DATA:  Left lower quadrant pain EXAM: PELVIS - 1-2 VIEW COMPARISON:  01/30/2015 FINDINGS: There is no evidence of pelvic fracture or diastasis. There is diffuse osteopenia. Metallic fixation rod and pin noted in proximal right femur. Degenerative changes lower lumbar spine. Moderate stool noted throughout the colon. IMPRESSION: No acute fracture or subluxation. Diffuse osteopenia. Postsurgical changes right femur.  Degenerative changes lumbar spine. Electronically Signed   By: Lahoma Crocker M.D.   On: 02/06/2015 12:08   Dg Abd 1 View  02/06/2015  CLINICAL DATA:  Left lower quadrant pain. EXAM: ABDOMEN - 1 VIEW COMPARISON:  02/06/2015. FINDINGS: Retained oral contrast is seen in the colon. Scattered stool and gas in the colon as well. No small bowel dilatation. Surgical clips in the right upper quadrant. Coils project over the sacrum. Postoperative changes in the proximal right femur. Degenerative changes in the spine. IMPRESSION: No acute findings. Electronically Signed   By: Lorin Picket M.D.   On: 02/06/2015 12:15   US Renal  02/04/2015  CLINICAL DATA:  Left flank pain for 1 day EXAM: RENAL / URINARY TRACT ULTRASOUND COMPLETE COMPARISON:  01/30/2015 FINDINGS: Right Kidney: Length: 10.4 cm. Mild cortical thinning is noted. An exophytic cyst is noted arising from the upper pole of the right kidney measuring 1.6 cm. This is similar to changes on recent CT examination. Left Kidney: Length: 9.1 cm. Cortical thinning is noted. Multiple cysts are seen  again similar to that noted on prior CT examination. Bladder: Appears normal for degree of bladder distention. IMPRESSION: Bilateral renal cysts.  No acute abnormality is noted. Electronically Signed   By: Inez Catalina M.D.   On: 02/04/2015 17:00        Scheduled Meds: . acetaminophen  650 mg Oral TID  . aspirin  325 mg Oral QHS  . carvedilol  25 mg Oral BID WC  . cloNIDine  0.1 mg Oral BID  . dicyclomine  20 mg Oral BID  . enoxaparin (LOVENOX) injection  30 mg Subcutaneous Q24H  . insulin aspart  0-5 Units Subcutaneous QHS  . insulin aspart  0-9 Units Subcutaneous TID WC  . lidocaine  1 patch Transdermal Q24H  . meclizine  25 mg Oral TID  . pantoprazole  40 mg Oral Daily  . simvastatin  20 mg Oral Q M,W,F-1800  . sodium chloride  3 mL Intravenous Q12H   Continuous Infusions: . sodium chloride      Active Problems:   Renal artery stenosis (HCC)   DM  (diabetes mellitus) (HCC)   CAD (coronary artery disease)   HTN (hypertension)   Hyperlipidemia   LLQ abdominal pain   Left flank pain   Hyponatremia    Time spent: 25 minutes.    Vernell Leep, MD, FACP, FHM. [facility] Pager [phone]  If 7PM-7AM, please contact night-coverage [email or web address] Password [email or web address] 02/06/2015, 2:28 PM    LOS: 3 days

## 2015-02-07 DIAGNOSIS — R1032 Left lower quadrant pain: Principal | ICD-10-CM

## 2015-02-07 DIAGNOSIS — E871 Hypo-osmolality and hyponatremia: Secondary | ICD-10-CM

## 2015-02-07 DIAGNOSIS — I10 Essential (primary) hypertension: Secondary | ICD-10-CM

## 2015-02-07 DIAGNOSIS — E785 Hyperlipidemia, unspecified: Secondary | ICD-10-CM

## 2015-02-07 DIAGNOSIS — I701 Atherosclerosis of renal artery: Secondary | ICD-10-CM

## 2015-02-07 LAB — GLUCOSE, CAPILLARY
GLUCOSE-CAPILLARY: 119 mg/dL — AB (ref 65–99)
Glucose-Capillary: 113 mg/dL — ABNORMAL HIGH (ref 65–99)

## 2015-02-07 LAB — BASIC METABOLIC PANEL
Anion gap: 11 (ref 5–15)
BUN: 19 mg/dL (ref 6–20)
CHLORIDE: 99 mmol/L — AB (ref 101–111)
CO2: 23 mmol/L (ref 22–32)
CREATININE: 1.32 mg/dL — AB (ref 0.44–1.00)
Calcium: 8.8 mg/dL — ABNORMAL LOW (ref 8.9–10.3)
GFR calc Af Amer: 43 mL/min — ABNORMAL LOW (ref >60)
GFR calc non Af Amer: 37 mL/min — ABNORMAL LOW (ref >60)
GLUCOSE: 106 mg/dL — AB (ref 65–99)
Potassium: 3.8 mmol/L (ref 3.5–5.1)
SODIUM: 133 mmol/L — AB (ref 135–145)

## 2015-02-07 MED ORDER — CYCLOBENZAPRINE HCL 5 MG PO TABS: 5.0000 mg | ORAL_TABLET | Freq: Three times a day (TID) | ORAL | Status: DC | PRN

## 2015-02-07 NOTE — Discharge Summary (Signed)
[name], is a 81 y.o. female  DOB 1931/05/24  MRN [id number].  Admission date:  02/03/2015  Admitting Physician  Theressa Millard, MD  Discharge Date:  02/07/2015   Primary MD  Irven Shelling, MD  Recommendations for primary care physician for things to follow:  - Labs next visit including CBC, BMP, to ensure stable renal function and hyponatremia, - Patient triamterene/hydrochlorothiazide has been held on discharge giving her hyponatremia.  Admission Diagnosis  Left flank pain [R10.9]   Discharge Diagnosis  Left flank pain [R10.9]    Active Problems:   Renal artery stenosis (HCC)   DM (diabetes mellitus) (HCC)   CAD (coronary artery disease)   HTN (hypertension)   Hyperlipidemia   LLQ abdominal pain   Left flank pain   Hyponatremia      Past Medical History  Diagnosis Date  . CAD (coronary artery disease)   . Renal artery stenosis ([place])   . HTN (hypertension)   . Dyslipidemia   . DM (diabetes mellitus) ([place])   . GERD (gastroesophageal reflux disease)   . Osteoporosis   . Meniere disease     "for 20 years" takes Antivert 3 times per day    Past Surgical History  Procedure Laterality Date  . Right hip fx 2011    . Left wrist fx 2012    . Abdominal hysterectomy    . Cholecystectomy    . Bladder tac      x3  . Cardiac catheterization    . Neck sx    . Breast surgery      breast reduction 1985  . Left renal stent    . Peripheral vascular catheterization Left 12/20/2014    Procedure: Renal Angiography;  Surgeon: Wellington Hampshire, MD;  Location: [facility];  Service: Cardiovascular;  Laterality: Left;       History of present illness and  Hospital Course:     Kindly see H&P for history of present illness and admission details, please review complete Labs, Consult reports and Test reports for all details in brief  HPI  from the history and physical done  on the day of admission [name] is a 81 y.o. female with a history of CAD, HTN, DM2, RAS S/P Left Renal Stent (12/20/2014) Hyperlipidemia who presents to the ED with complaints of 10/10 sharp Left Sided ABD Pain and Flank Pain x She has had Nausea but no Vomiting. She was seen in the ED on 10/17 and had a CT scan of the ABD/Pelvis without contrast which was negative for acute findings. She was discharged to home on medication for nausea and her pain. Her BUN/Cr is mildly increased from 10/17, however her hyponatremia has improved from 123 now 128.    Hospital Course    LLQ abdominal pain/left flank pain - workup with no clear etiology, this is most likely Musculoskeletal as significantly improving on  muscle relaxants,  - CT abdomen and pelvis without contrast 10/18 showed no acute abnormalities to explain patient's  symptoms. Scattered diverticulosis along the descending and sigmoid colon without evidence of diverticulitis. No diarrhea reported. - Renal ultrasound: No acute abnormalities - Urine microscopy: Not impressive for UTI. - Patient has healing bruise in the left buttock/flank area.  Dr Algis Liming  Discussed with interventional cardiologist/Dr. Fletcher Anon on 10/24: Does not think that the pain is stent related which was done in 2012, only had recent angiography 6 weeks ago and there was no new stent placement. Question of bladder mass on last visit with him but CT abdomen without contrast was negative. - No acute findings on x-ray abdomen and pelvis on 10/25  Hyponatremia - Secondary to thiazide diuretics. Discontinued. Sodium is 133 on discharge  Acute on Stage III chronic kidney disease - Creatinine has been in the 1.6 range since August 2016. Creatinine has worsened to 1.74 from 1.5 on 10/23. Improved with IV fluids, creatinine is 1.33 on discharge .  Renovascular hypertension - on carvedilol and clonidine and on losartan,  Thiazides discontinued secondary to  hyponatremia - Intermittent fluctuations  HLD - Statins  Type II DM - Control during hospital stay, not on any home medication  Left renal artery stenosis - Status post stent in 2012. Occluded by recent angiogram.  Anemia - Stable  Discharge Condition:  Stable   Follow UP  Follow-up Information    Follow up with [facility].   Why:  Someone from [facility] will contact you concerning start date and time for therapy.   Contact information:   [address] [place] [address] [phone]       Go to Irven Shelling, MD.   Specialty:  Internal Medicine   Why:  keep ypur appointment this friday   Contact information:   [place] [place] [address] [phone]         Discharge Instructions  and  Discharge Medications         Discharge Instructions    Diet - low sodium heart healthy    Complete by:  As directed      Discharge instructions    Complete by:  As directed   Follow with Primary MD Irven Shelling, MD this friday  Get CBC, CMP, 2 view Chest X ray checked  by Primary MD next visit.    Activity: As tolerated with Full fall precautions use walker/cane & assistance as needed   Disposition Home **   Diet: Heart Healthy ** , with feeding assistance and aspiration precautions.  For Heart failure patients - Check your Weight same time everyday, if you gain over 2 pounds, or you develop in leg swelling, experience more shortness of breath or chest pain, call your Primary MD immediately. Follow Cardiac Low Salt Diet and 1.5 lit/day fluid restriction.   On your next visit with your primary care physician please Get Medicines reviewed and adjusted.   Please request your Prim.MD to go over all Hospital Tests and Procedure/Radiological results at the follow up, please get all Hospital records sent to your Prim MD by signing hospital release before you go home.   If you experience  worsening of your admission symptoms, develop shortness of breath, life threatening emergency, suicidal or homicidal thoughts you must seek medical attention immediately by calling 911 or calling your MD immediately  if symptoms less severe.  You Must read complete instructions/literature along with all the possible adverse reactions/side effects for all the Medicines you take and that have been prescribed to you. Take any  new Medicines after you have completely understood and accpet all the possible adverse reactions/side effects.   Do not drive, operating heavy machinery, perform activities at heights, swimming or participation in water activities or provide baby sitting services if your were admitted for syncope or siezures until you have seen by Primary MD or a Neurologist and advised to do so again.  Do not drive when taking Pain medications.    Do not take more than prescribed Pain, Sleep and Anxiety Medications  Special Instructions: If you have smoked or chewed Tobacco  in the last 2 yrs please stop smoking, stop any regular Alcohol  and or any Recreational drug use.  Wear Seat belts while driving.   Please note  You were cared for by a hospitalist during your hospital stay. If you have any questions about your discharge medications or the care you received while you were in the hospital after you are discharged, you can call the unit and asked to speak with the hospitalist on call if the hospitalist that took care of you is not available. Once you are discharged, your primary care physician will handle any further medical issues. Please note that NO REFILLS for any discharge medications will be authorized once you are discharged, as it is imperative that you return to your primary care physician (or establish a relationship with a primary care physician if you do not have one) for your aftercare needs so that they can reassess your need for medications and monitor your lab values.      Increase activity slowly    Complete by:  As directed             Medication List    STOP taking these medications        triamterene-hydrochlorothiazide 37.5-25 MG tablet  Commonly known as:  MAXZIDE-25      TAKE these medications        ACCU-CHEK COMPACT PLUS test strip  Generic drug:  glucose blood  1 each by Other route See admin instructions. Check blood sugar at bedtime     ACCU-CHEK SOFTCLIX LANCETS lancets  1 each by Other route See admin instructions. Check blood sugar at bedtime     aspirin 325 MG tablet  Take 325 mg by mouth at bedtime.     betamethasone dipropionate 0.05 % cream  Commonly known as:  DIPROLENE  Apply 1 application topically daily as needed (irritated skin).     carvedilol 25 MG tablet  Commonly known as:  COREG  Take 25 mg by mouth 2 (two) times daily with a meal.     cholecalciferol 1000 UNITS tablet  Commonly known as:  VITAMIN D  Take 2,000 Units by mouth daily.     CITRACAL PO  Take 1,200 mg by mouth daily.     cloNIDine 0.1 MG tablet  Commonly known as:  CATAPRES  Take 1 tablet (0.1 mg total) by mouth 2 (two) times daily.     Cod Liver Oil Caps  Take 1 capsule by mouth daily.     cyclobenzaprine 5 MG tablet  Commonly known as:  FLEXERIL  Take 1 tablet (5 mg total) by mouth 3 (three) times daily as needed for muscle spasms.     dicyclomine 20 MG tablet  Commonly known as:  BENTYL  Take 1 tablet (20 mg total) by mouth 2 (two) times daily.     esomeprazole 40 MG capsule  Commonly known as:  NEXIUM  Take 40 mg by mouth  2 (two) times daily.     fexofenadine 180 MG tablet  Commonly known as:  ALLEGRA  Take 180 mg by mouth daily.     meclizine 25 MG tablet  Commonly known as:  ANTIVERT  Take 25 mg by mouth 3 (three) times daily.     nitroGLYCERIN 0.4 MG SL tablet  Commonly known as:  NITROSTAT  Place 0.4 mg under the tongue every 5 (five) minutes as needed for chest pain (3 doses MAX).     olmesartan 40 MG tablet   Commonly known as:  BENICAR  Take 20 mg by mouth daily.     PREMARIN vaginal cream  Generic drug:  conjugated estrogens  Place 1-2 g vaginally 2 (two) times a week.     PRESERVISION AREDS Caps  Take 1 capsule by mouth 2 (two) times daily.     promethazine 25 MG tablet  Commonly known as:  PHENERGAN  Take 0.5 tablets (12.5 mg total) by mouth every 6 (six) hours as needed for nausea or vomiting.     simvastatin 20 MG tablet  Commonly known as:  ZOCOR  Take 20 mg by mouth every Monday, Wednesday, and Friday.     VITAMIN E PO  Take 1 capsule by mouth daily.          Diet and Activity recommendation: See Discharge Instructions above   Consults obtained -  None   Major procedures and Radiology Reports - PLEASE review detailed and final reports for all details, in brief -      Ct Abdomen Pelvis Wo Contrast  01/30/2015  CLINICAL DATA:  Acute onset of left flank pain, vomiting and constipation. Initial encounter. EXAM: CT ABDOMEN AND PELVIS WITHOUT CONTRAST TECHNIQUE: Multidetector CT imaging of the abdomen and pelvis was performed following the standard protocol without IV contrast. COMPARISON:  CT of the abdomen pelvis from 08/20/2012, and renal ultrasound performed 12/29/2014 FINDINGS: The visualized lung bases are clear. Calcification is noted at the mitral and aortic valves. Scattered coronary artery calcifications are seen. The liver and spleen are unremarkable in appearance. The patient is status post cholecystectomy, with clips noted at the gallbladder fossa. The pancreas and adrenal glands are unremarkable. Small bilateral renal cysts are seen. Nonspecific perinephric stranding is noted bilaterally. Mild left renal atrophy is noted. There is no evidence of hydronephrosis. No renal or ureteral stones are identified. No free fluid is identified. The small bowel is unremarkable in appearance. A small duodenal diverticulum is noted at the pancreatic head. The stomach is within  normal limits. No acute vascular abnormalities are seen. Scattered calcification is noted along the abdominal aorta and its branches. The appendix is not seen. There is no evidence for appendicitis. Scattered diverticulosis is noted along the descending and sigmoid colon, without evidence of diverticulitis. The bladder is moderately distended and grossly unremarkable. The patient is status post hysterectomy. No suspicious adnexal masses are seen. No inguinal lymphadenopathy is seen. No acute osseous abnormalities are identified. Visualized right femoral hardware is grossly unremarkable in appearance. Multilevel vacuum phenomenon is noted along the lower thoracic and lumbar spine, with endplate sclerotic changes noted at multiple levels. IMPRESSION: 1. No acute abnormality seen to explain the patient's symptoms. 2. Mild left renal atrophy noted.  Small bilateral renal cysts seen. 3. Calcification at the mitral and aortic valves. Scattered coronary artery calcification noted. 4. Small duodenal diverticulum at the pancreatic head. 5. Scattered calcification along the abdominal aorta and its branches. 6. Scattered diverticulosis along the descending and  sigmoid colon, without evidence of diverticulitis. 7. Degenerative change along the lower thoracic and lumbar spine. Electronically Signed   By: Garald Balding M.D.   On: 01/30/2015 02:15   Dg Pelvis 1-2 Views  02/06/2015  CLINICAL DATA:  Left lower quadrant pain EXAM: PELVIS - 1-2 VIEW COMPARISON:  01/30/2015 FINDINGS: There is no evidence of pelvic fracture or diastasis. There is diffuse osteopenia. Metallic fixation rod and pin noted in proximal right femur. Degenerative changes lower lumbar spine. Moderate stool noted throughout the colon. IMPRESSION: No acute fracture or subluxation. Diffuse osteopenia. Postsurgical changes right femur. Degenerative changes lumbar spine. Electronically Signed   By: Lahoma Crocker M.D.   On: 02/06/2015 12:08   Dg Abd 1  View  02/06/2015  CLINICAL DATA:  Left lower quadrant pain. EXAM: ABDOMEN - 1 VIEW COMPARISON:  02/06/2015. FINDINGS: Retained oral contrast is seen in the colon. Scattered stool and gas in the colon as well. No small bowel dilatation. Surgical clips in the right upper quadrant. Coils project over the sacrum. Postoperative changes in the proximal right femur. Degenerative changes in the spine. IMPRESSION: No acute findings. Electronically Signed   By: Lorin Picket M.D.   On: 02/06/2015 12:15   US Renal  02/04/2015  CLINICAL DATA:  Left flank pain for 1 day EXAM: RENAL / URINARY TRACT ULTRASOUND COMPLETE COMPARISON:  01/30/2015 FINDINGS: Right Kidney: Length: 10.4 cm. Mild cortical thinning is noted. An exophytic cyst is noted arising from the upper pole of the right kidney measuring 1.6 cm. This is similar to changes on recent CT examination. Left Kidney: Length: 9.1 cm. Cortical thinning is noted. Multiple cysts are seen again similar to that noted on prior CT examination. Bladder: Appears normal for degree of bladder distention. IMPRESSION: Bilateral renal cysts.  No acute abnormality is noted. Electronically Signed   By: Inez Catalina M.D.   On: 02/04/2015 17:00    Micro Results     No results found for this or any previous visit (from the past 240 hour(s)).     Today   Subjective:   Linda Grimmer today has no headache,no chest abdominal pain,no new weakness tingling or numbness, feels much better wants to go home today, no further abdominal pain, had good night sleep.  Objective:   Blood pressure 170/56, pulse 63, temperature 98.9 F (37.2 C), temperature source Oral, resp. rate 16, height 5\' 1"  (1.549 m), weight 56.337 kg (124 lb 3.2 oz), SpO2 100 %.   Intake/Output Summary (Last 24 hours) at 02/07/15 1027 Last data filed at 02/07/15 0600  Gross per 24 hour  Intake 1468.75 ml  Output      0 ml  Net 1468.75 ml    Exam Awake Alert, Oriented x 3, No new F.N deficits, Normal  affect [place].AT,PERRAL Supple Neck,No JVD, No cervical lymphadenopathy appriciated.  Symmetrical Chest wall movement, Good air movement bilaterally, CTAB RRR,No Gallops,Rubs or new Murmurs, No Parasternal Heave +ve B.Sounds, Abd Soft, Non tender, No organomegaly appriciated, No rebound -guarding or rigidity. No flank tenderness. No Cyanosis, Clubbing or edema, No new Rash or bruise  Data Review   CBC w Diff:  Lab Results  Component Value Date   WBC 6.2 02/06/2015   HGB 11.8* 02/06/2015   HCT 35.4* 02/06/2015   PLT 230 02/06/2015   LYMPHOPCT 27.1 12/12/2014   MONOPCT 7.0 12/12/2014   EOSPCT 3.2 12/12/2014   BASOPCT 0.4 12/12/2014    CMP:  Lab Results  Component Value Date   NA 133*  02/07/2015   K 3.8 02/07/2015   CL 99* 02/07/2015   CO2 23 02/07/2015   BUN 19 02/07/2015   CREATININE 1.32* 02/07/2015   PROT 6.5 02/03/2015   ALBUMIN 3.3* 02/03/2015   BILITOT 0.7 02/03/2015   ALKPHOS 58 02/03/2015   AST 23 02/03/2015   ALT 16 02/03/2015  .   Total Time in preparing paper work, data evaluation and todays exam - 35 minutes  Srihan Brutus M.D on 02/07/2015 at 10:27 AM  [facility]   Office  [phone]

## 2015-02-07 NOTE — Progress Notes (Signed)
D/C teaching reviewed including AVS, prescription, medication timing, MD follow-up visit, etc with both patient and her husband. Will be assisted to exit with personal belongings via w/c by volunteer and nursing student.

## 2015-02-07 NOTE — Discharge Instructions (Signed)
Follow with Primary MD [name],Cathy JOSEPH, MD in 7 days  ° °Get CBC, CMP, 2 view Chest X ray checked  by Primary MD next visit.  ° ° °Activity: As tolerated with Full fall precautions use walker/cane & assistance as needed ° ° °Disposition Home  ° ° °Diet: Heart Healthy  , with feeding assistance and aspiration precautions. ° °For Heart failure patients - Check your Weight same time everyday, if you gain over 2 pounds, or you develop in leg swelling, experience more shortness of breath or chest pain, call your Primary MD immediately. Follow Cardiac Low Salt Diet and 1.5 lit/day fluid restriction. ° ° °On your next visit with your primary care physician please Get Medicines reviewed and adjusted. ° ° °Please request your Prim.MD to go over all Hospital Tests and Procedure/Radiological results at the follow up, please get all Hospital records sent to your Prim MD by signing hospital release before you go home. ° ° °If you experience worsening of your admission symptoms, develop shortness of breath, life threatening emergency, suicidal or homicidal thoughts you must seek medical attention immediately by calling 911 or calling your MD immediately  if symptoms less severe. ° °You Must read complete instructions/literature along with all the possible adverse reactions/side effects for all the Medicines you take and that have been prescribed to you. Take any new Medicines after you have completely understood and accpet all the possible adverse reactions/side effects.  ° °Do not drive, operating heavy machinery, perform activities at heights, swimming or participation in water activities or provide baby sitting services if your were admitted for syncope or siezures until you have seen by Primary MD or a Neurologist and advised to do so again. ° °Do not drive when taking Pain medications.  ° ° °Do not take more than prescribed Pain, Sleep and Anxiety Medications ° °Special Instructions: If you have smoked or chewed Tobacco   in the last 2 yrs please stop smoking, stop any regular Alcohol  and or any Recreational drug use. ° °Wear Seat belts while driving. ° ° °Please note ° °You were cared for by a hospitalist during your hospital stay. If you have any questions about your discharge medications or the care you received while you were in the hospital after you are discharged, you can call the unit and asked to speak with the hospitalist on call if the hospitalist that took care of you is not available. Once you are discharged, your primary care physician will handle any further medical issues. Please note that NO REFILLS for any discharge medications will be authorized once you are discharged, as it is imperative that you return to your primary care physician (or establish a relationship with a primary care physician if you do not have one) for your aftercare needs so that they can reassess your need for medications and monitor your lab values. ° ° °  ° °

## 2015-02-08 NOTE — ED Provider Notes (Signed)
CSN: [id number]     Arrival date & time 02/03/15  1947 History   First MD Initiated Contact with Patient 02/03/15 1950     Chief Complaint  Patient presents with  . Flank Pain     (Consider location/radiation/quality/duration/timing/severity/associated sxs/prior Treatment) HPI Cathy PATNAUDE is a 81 y.o. female with a history of CAD, HTN, DM2, RAS S/P Left Renal Stent (12/20/2014) Hyperlipidemia who presents to the ED with complaints of 10/10 sharp Left Sided ABD Pain and Flank Pain x She has had Nausea but no Vomiting. She was seen in the ED on 10/17 and had a CT scan of the ABD/Pelvis without contrast which was negative for acute findings. She was discharged to home on medication for nausea and her pain. Past Medical History  Diagnosis Date  . CAD (coronary artery disease)   . Renal artery stenosis ([place])   . HTN (hypertension)   . Dyslipidemia   . DM (diabetes mellitus) ([place])   . GERD (gastroesophageal reflux disease)   . Osteoporosis   . Meniere disease     "for 20 years" takes Antivert 3 times per day   Past Surgical History  Procedure Laterality Date  . Right hip fx 2011    . Left wrist fx 2012    . Abdominal hysterectomy    . Cholecystectomy    . Bladder tac      x3  . Cardiac catheterization    . Neck sx    . Breast surgery      breast reduction 1985  . Left renal stent    . Peripheral vascular catheterization Left 12/20/2014    Procedure: Renal Angiography;  Surgeon: Wellington Hampshire, MD;  Location: [facility];  Service: Cardiovascular;  Laterality: Left;   Family History  Problem Relation Age of Onset  . Diabetes type II Mother   . Heart attack Mother   . Heart disease Mother   . Congestive Heart Failure Father   . Pulmonary embolism Father   . Alzheimer's disease Sister   . Heart disease Sister   . Heart disease Brother   . Healthy Sister   . Healthy Sister   . Stroke Brother   . Dementia Brother     VASCULAR  . Stroke Brother   .  Diabetes type II Brother   . Other Brother     KILLED IN PLANE CRASH  . Pulmonary disease Sister   . Heart attack Sister   . Kidney failure Sister   . Diabetes type II Sister   . Diabetes type II Sister   . Kidney failure Sister    Social History  Substance Use Topics  . Smoking status: Former Research scientist (life sciences)  . Smokeless tobacco: Never Used  . Alcohol Use: No   OB History    No data available     Review of Systems  All other systems reviewed and are negative  Allergies  Fentanyl; Morphine and related; Actamin; Actonel; Amlodipine; Avapro; Cardio complete; Cardizem; Ciprofloxacin; Codeine; Dilaudid; Forteo; Fosamax; Hydralazine; Inapsine; Ivp dye; Lipitor; Lisinopril; Micardis; Septra; Shellfish allergy; Tekturna; Tricor; and Tussionex pennkinetic er  Home Medications   Prior to Admission medications   Medication Sig Start Date End Date Taking? Authorizing Provider  aspirin 325 MG tablet Take 325 mg by mouth at bedtime.   Yes Historical Provider, MD  Calcium Citrate (CITRACAL PO) Take 1,200 mg by mouth daily.   Yes Historical Provider, MD  carvedilol (COREG) 25 MG tablet Take 25 mg by mouth  2 (two) times daily with a meal.   Yes Historical Provider, MD  cholecalciferol (VITAMIN D) 1000 UNITS tablet Take 2,000 Units by mouth daily.    Yes Historical Provider, MD  cloNIDine (CATAPRES) 0.1 MG tablet Take 1 tablet (0.1 mg total) by mouth 2 (two) times daily. 01/02/15 01/18/16 Yes Wellington Hampshire, MD  Cod Liver Oil CAPS Take 1 capsule by mouth daily.   Yes Historical Provider, MD  dicyclomine (BENTYL) 20 MG tablet Take 1 tablet (20 mg total) by mouth 2 (two) times daily. 01/30/15  Yes Kaitlyn Szekalski, PA-C  esomeprazole (NEXIUM) 40 MG capsule Take 40 mg by mouth 2 (two) times daily.   Yes Historical Provider, MD  fexofenadine (ALLEGRA) 180 MG tablet Take 180 mg by mouth daily.   Yes Historical Provider, MD  meclizine (ANTIVERT) 25 MG tablet Take 25 mg by mouth 3 (three) times daily.    Yes  Historical Provider, MD  Multiple Vitamins-Minerals (PRESERVISION AREDS) CAPS Take 1 capsule by mouth 2 (two) times daily.    Yes Historical Provider, MD  olmesartan (BENICAR) 40 MG tablet Take 20 mg by mouth daily.    Yes Historical Provider, MD  PREMARIN vaginal cream Place 1-2 g vaginally 2 (two) times a week. 10/06/14  Yes Historical Provider, MD  promethazine (PHENERGAN) 25 MG tablet Take 0.5 tablets (12.5 mg total) by mouth every 6 (six) hours as needed for nausea or vomiting. 01/30/15  Yes Alvina Chou, PA-C  simvastatin (ZOCOR) 20 MG tablet Take 20 mg by mouth every Monday, Wednesday, and Friday.  10/14/11  Yes Historical Provider, MD  VITAMIN E PO Take 1 capsule by mouth daily.   Yes Historical Provider, MD  ACCU-CHEK COMPACT PLUS test strip 1 each by Other route See admin instructions. Check blood sugar at bedtime 09/22/14   Historical Provider, MD  ACCU-CHEK SOFTCLIX LANCETS lancets 1 each by Other route See admin instructions. Check blood sugar at bedtime 10/09/14   Historical Provider, MD  betamethasone dipropionate (DIPROLENE) 0.05 % cream Apply 1 application topically daily as needed (irritated skin).    Historical Provider, MD  cyclobenzaprine (FLEXERIL) 5 MG tablet Take 1 tablet (5 mg total) by mouth 3 (three) times daily as needed for muscle spasms. 02/07/15   Silver Huguenin Elgergawy, MD  nitroGLYCERIN (NITROSTAT) 0.4 MG SL tablet Place 0.4 mg under the tongue every 5 (five) minutes as needed for chest pain (3 doses MAX).    Historical Provider, MD   BP 170/56 mmHg  Pulse 63  Temp(Src) 98.9 F (37.2 C) (Oral)  Resp 16  Ht 5\' 1"  (1.549 m)  Wt 124 lb 3.2 oz (56.337 kg)  BMI 23.48 kg/m2  SpO2 100% Physical Exam  Constitutional: She is oriented to person, place, and time. She appears well-developed and well-nourished. No distress.  HENT:  Head: Normocephalic and atraumatic.  Eyes: Pupils are equal, round, and reactive to light.  Neck: Normal range of motion.  Cardiovascular:  Normal rate and intact distal pulses.   Pulmonary/Chest: No respiratory distress.  Abdominal: Normal appearance. She exhibits no distension. There is tenderness.    Musculoskeletal: Normal range of motion.  Neurological: She is alert and oriented to person, place, and time. No cranial nerve deficit.  Skin: Skin is warm and dry. No rash noted.  Psychiatric: She has a normal mood and affect. Her behavior is normal.  Nursing note and vitals reviewed.   ED Course  Procedures (including critical care time) Labs Review Labs Reviewed  COMPREHENSIVE METABOLIC PANEL -  Abnormal; Notable for the following:    Sodium 128 (*)    Chloride 94 (*)    CO2 20 (*)    Glucose, Bld 155 (*)    BUN 22 (*)    Creatinine, Ser 1.53 (*)    Calcium 8.8 (*)    Albumin 3.3 (*)    GFR calc non Af Amer 31 (*)    GFR calc Af Amer 36 (*)    All other components within normal limits  CBC - Abnormal; Notable for the following:    HCT 34.7 (*)    All other components within normal limits  URINALYSIS, ROUTINE W REFLEX MICROSCOPIC (NOT AT [facility]) - Abnormal; Notable for the following:    Hgb urine dipstick TRACE (*)    Ketones, ur 15 (*)    Protein, ur 100 (*)    All other components within normal limits  BASIC METABOLIC PANEL - Abnormal; Notable for the following:    Sodium 130 (*)    Chloride 97 (*)    CO2 20 (*)    Glucose, Bld 119 (*)    BUN 23 (*)    Creatinine, Ser 1.53 (*)    Calcium 8.5 (*)    GFR calc non Af Amer 31 (*)    GFR calc Af Amer 36 (*)    All other components within normal limits  CBC - Abnormal; Notable for the following:    Hemoglobin 11.7 (*)    HCT 33.6 (*)    All other components within normal limits  HEMOGLOBIN A1C - Abnormal; Notable for the following:    Hgb A1c MFr Bld 6.5 (*)    All other components within normal limits  GLUCOSE, CAPILLARY - Abnormal; Notable for the following:    Glucose-Capillary 110 (*)    All other components within normal limits  GLUCOSE, CAPILLARY  - Abnormal; Notable for the following:    Glucose-Capillary 152 (*)    All other components within normal limits  GLUCOSE, CAPILLARY - Abnormal; Notable for the following:    Glucose-Capillary 113 (*)    All other components within normal limits  GLUCOSE, CAPILLARY - Abnormal; Notable for the following:    Glucose-Capillary 153 (*)    All other components within normal limits  CBC - Abnormal; Notable for the following:    Hemoglobin 11.8 (*)    HCT 35.4 (*)    All other components within normal limits  BASIC METABOLIC PANEL - Abnormal; Notable for the following:    Sodium 130 (*)    Chloride 96 (*)    Glucose, Bld 117 (*)    BUN 25 (*)    Creatinine, Ser 1.74 (*)    Calcium 8.4 (*)    GFR calc non Af Amer 27 (*)    GFR calc Af Amer 31 (*)    All other components within normal limits  GLUCOSE, CAPILLARY - Abnormal; Notable for the following:    Glucose-Capillary 110 (*)    All other components within normal limits  GLUCOSE, CAPILLARY - Abnormal; Notable for the following:    Glucose-Capillary 116 (*)    All other components within normal limits  GLUCOSE, CAPILLARY - Abnormal; Notable for the following:    Glucose-Capillary 116 (*)    All other components within normal limits  GLUCOSE, CAPILLARY - Abnormal; Notable for the following:    Glucose-Capillary 146 (*)    All other components within normal limits  BASIC METABOLIC PANEL - Abnormal; Notable for the following:  Sodium 133 (*)    Chloride 99 (*)    Glucose, Bld 106 (*)    Creatinine, Ser 1.32 (*)    Calcium 8.8 (*)    GFR calc non Af Amer 37 (*)    GFR calc Af Amer 43 (*)    All other components within normal limits  GLUCOSE, CAPILLARY - Abnormal; Notable for the following:    Glucose-Capillary 127 (*)    All other components within normal limits  GLUCOSE, CAPILLARY - Abnormal; Notable for the following:    Glucose-Capillary 119 (*)    All other components within normal limits  GLUCOSE, CAPILLARY - Abnormal;  Notable for the following:    Glucose-Capillary 113 (*)    All other components within normal limits  I-STAT CG4 LACTIC ACID, ED - Abnormal; Notable for the following:    Lactic Acid, Venous 0.44 (*)    All other components within normal limits  LIPASE, BLOOD  URINE MICROSCOPIC-ADD ON  GLUCOSE, CAPILLARY  GLUCOSE, CAPILLARY    Imaging Review Dg Pelvis 1-2 Views  02/06/2015  CLINICAL DATA:  Left lower quadrant pain EXAM: PELVIS - 1-2 VIEW COMPARISON:  01/30/2015 FINDINGS: There is no evidence of pelvic fracture or diastasis. There is diffuse osteopenia. Metallic fixation rod and pin noted in proximal right femur. Degenerative changes lower lumbar spine. Moderate stool noted throughout the colon. IMPRESSION: No acute fracture or subluxation. Diffuse osteopenia. Postsurgical changes right femur. Degenerative changes lumbar spine. Electronically Signed   By: Lahoma Crocker M.D.   On: 02/06/2015 12:08   Dg Abd 1 View  02/06/2015  CLINICAL DATA:  Left lower quadrant pain. EXAM: ABDOMEN - 1 VIEW COMPARISON:  02/06/2015. FINDINGS: Retained oral contrast is seen in the colon. Scattered stool and gas in the colon as well. No small bowel dilatation. Surgical clips in the right upper quadrant. Coils project over the sacrum. Postoperative changes in the proximal right femur. Degenerative changes in the spine. IMPRESSION: No acute findings. Electronically Signed   By: Lorin Picket M.D.   On: 02/06/2015 12:15   I have personally reviewed and evaluated these images and lab results as part of my medical decision-making.   EKG Interpretation None      MDM   Final diagnoses:  Left flank pain        Leonard Schwartz, MD 02/08/15 [phone]

## 2015-02-26 ENCOUNTER — Encounter (HOSPITAL_COMMUNITY): Payer: Self-pay | Admitting: Emergency Medicine

## 2015-02-26 ENCOUNTER — Emergency Department (HOSPITAL_COMMUNITY): Payer: Medicare Other

## 2015-02-26 ENCOUNTER — Inpatient Hospital Stay (HOSPITAL_COMMUNITY)
Admission: EM | Admit: 2015-02-26 | Discharge: 2015-02-28 | DRG: 641 | Disposition: A | Discharge status: Home / Self Care | Payer: Medicare Other | Attending: Internal Medicine | Admitting: Internal Medicine

## 2015-02-26 DIAGNOSIS — I2510 Atherosclerotic heart disease of native coronary artery without angina pectoris: Secondary | ICD-10-CM | POA: Diagnosis present

## 2015-02-26 DIAGNOSIS — N390 Urinary tract infection, site not specified: Secondary | ICD-10-CM | POA: Diagnosis present

## 2015-02-26 DIAGNOSIS — E871 Hypo-osmolality and hyponatremia: Principal | ICD-10-CM

## 2015-02-26 DIAGNOSIS — S22080A Wedge compression fracture of T11-T12 vertebra, initial encounter for closed fracture: Secondary | ICD-10-CM

## 2015-02-26 DIAGNOSIS — Z79899 Other long term (current) drug therapy: Secondary | ICD-10-CM

## 2015-02-26 DIAGNOSIS — E860 Dehydration: Secondary | ICD-10-CM | POA: Diagnosis present

## 2015-02-26 DIAGNOSIS — E119 Type 2 diabetes mellitus without complications: Secondary | ICD-10-CM

## 2015-02-26 DIAGNOSIS — E785 Hyperlipidemia, unspecified: Secondary | ICD-10-CM | POA: Diagnosis present

## 2015-02-26 DIAGNOSIS — I252 Old myocardial infarction: Secondary | ICD-10-CM

## 2015-02-26 DIAGNOSIS — R109 Unspecified abdominal pain: Secondary | ICD-10-CM | POA: Diagnosis present

## 2015-02-26 DIAGNOSIS — I701 Atherosclerosis of renal artery: Secondary | ICD-10-CM | POA: Diagnosis present

## 2015-02-26 DIAGNOSIS — N182 Chronic kidney disease, stage 2 (mild): Secondary | ICD-10-CM | POA: Diagnosis not present

## 2015-02-26 DIAGNOSIS — I10 Essential (primary) hypertension: Secondary | ICD-10-CM | POA: Diagnosis present

## 2015-02-26 DIAGNOSIS — M25552 Pain in left hip: Secondary | ICD-10-CM

## 2015-02-26 DIAGNOSIS — H8109 Meniere's disease, unspecified ear: Secondary | ICD-10-CM | POA: Diagnosis present

## 2015-02-26 DIAGNOSIS — Z9842 Cataract extraction status, left eye: Secondary | ICD-10-CM

## 2015-02-26 DIAGNOSIS — I129 Hypertensive chronic kidney disease with stage 1 through stage 4 chronic kidney disease, or unspecified chronic kidney disease: Secondary | ICD-10-CM | POA: Diagnosis present

## 2015-02-26 DIAGNOSIS — Z889 Allergy status to unspecified drugs, medicaments and biological substances status: Secondary | ICD-10-CM

## 2015-02-26 DIAGNOSIS — M4854XS Collapsed vertebra, not elsewhere classified, thoracic region, sequela of fracture: Secondary | ICD-10-CM

## 2015-02-26 DIAGNOSIS — Z7982 Long term (current) use of aspirin: Secondary | ICD-10-CM

## 2015-02-26 DIAGNOSIS — Z9841 Cataract extraction status, right eye: Secondary | ICD-10-CM

## 2015-02-26 DIAGNOSIS — Z981 Arthrodesis status: Secondary | ICD-10-CM

## 2015-02-26 DIAGNOSIS — M4854XA Collapsed vertebra, not elsewhere classified, thoracic region, initial encounter for fracture: Secondary | ICD-10-CM | POA: Diagnosis present

## 2015-02-26 DIAGNOSIS — N183 Chronic kidney disease, stage 3 unspecified: Secondary | ICD-10-CM

## 2015-02-26 DIAGNOSIS — K219 Gastro-esophageal reflux disease without esophagitis: Secondary | ICD-10-CM | POA: Diagnosis not present

## 2015-02-26 DIAGNOSIS — Z961 Presence of intraocular lens: Secondary | ICD-10-CM | POA: Diagnosis present

## 2015-02-26 DIAGNOSIS — R110 Nausea: Secondary | ICD-10-CM

## 2015-02-26 DIAGNOSIS — R531 Weakness: Secondary | ICD-10-CM

## 2015-02-26 DIAGNOSIS — Z87891 Personal history of nicotine dependence: Secondary | ICD-10-CM

## 2015-02-26 DIAGNOSIS — E1122 Type 2 diabetes mellitus with diabetic chronic kidney disease: Secondary | ICD-10-CM | POA: Diagnosis present

## 2015-02-26 DIAGNOSIS — M810 Age-related osteoporosis without current pathological fracture: Secondary | ICD-10-CM | POA: Diagnosis present

## 2015-02-26 HISTORY — DX: Other complications of anesthesia, initial encounter: T88.59XA

## 2015-02-26 HISTORY — DX: Type 2 diabetes mellitus without complications: E11.9

## 2015-02-26 HISTORY — DX: Cardiac murmur, unspecified: R01.1

## 2015-02-26 HISTORY — DX: Personal history of other diseases of the digestive system: Z87.19

## 2015-02-26 HISTORY — DX: Weakness: R53.1

## 2015-02-26 HISTORY — DX: Adverse effect of unspecified anesthetic, initial encounter: T41.45XA

## 2015-02-26 HISTORY — DX: Acute myocardial infarction, unspecified: I21.9

## 2015-02-26 LAB — HEPATIC FUNCTION PANEL
ALBUMIN: 3.1 g/dL — AB (ref 3.5–5.0)
ALT: 14 U/L (ref 14–54)
AST: 22 U/L (ref 15–41)
Alkaline Phosphatase: 52 U/L (ref 38–126)
BILIRUBIN INDIRECT: 0.5 mg/dL (ref 0.3–0.9)
Bilirubin, Direct: 0.1 mg/dL (ref 0.1–0.5)
TOTAL PROTEIN: 6 g/dL — AB (ref 6.5–8.1)
Total Bilirubin: 0.6 mg/dL (ref 0.3–1.2)

## 2015-02-26 LAB — URINE MICROSCOPIC-ADD ON

## 2015-02-26 LAB — BASIC METABOLIC PANEL
Anion gap: 11 (ref 5–15)
BUN: 12 mg/dL (ref 6–20)
CO2: 20 mmol/L — ABNORMAL LOW (ref 22–32)
CREATININE: 1.11 mg/dL — AB (ref 0.44–1.00)
Calcium: 8.6 mg/dL — ABNORMAL LOW (ref 8.9–10.3)
Chloride: 93 mmol/L — ABNORMAL LOW (ref 101–111)
GFR, EST AFRICAN AMERICAN: 53 mL/min — AB (ref >60)
GFR, EST NON AFRICAN AMERICAN: 45 mL/min — AB (ref >60)
Glucose, Bld: 148 mg/dL — ABNORMAL HIGH (ref 65–99)
Potassium: 3.5 mmol/L (ref 3.5–5.1)
SODIUM: 124 mmol/L — AB (ref 135–145)

## 2015-02-26 LAB — CBC
HCT: 32.5 % — ABNORMAL LOW (ref 36.0–46.0)
Hemoglobin: 12 g/dL (ref 12.0–15.0)
MCH: 29.5 pg (ref 26.0–34.0)
MCHC: 36.9 g/dL — ABNORMAL HIGH (ref 30.0–36.0)
MCV: 79.9 fL (ref 78.0–100.0)
PLATELETS: 183 10*3/uL (ref 150–400)
RBC: 4.07 MIL/uL (ref 3.87–5.11)
RDW: 12.5 % (ref 11.5–15.5)
WBC: 5.6 10*3/uL (ref 4.0–10.5)

## 2015-02-26 LAB — I-STAT TROPONIN, ED: Troponin i, poc: 0.02 ng/mL (ref 0.00–0.08)

## 2015-02-26 LAB — LIPASE, BLOOD: LIPASE: 44 U/L (ref 11–51)

## 2015-02-26 LAB — URINALYSIS, ROUTINE W REFLEX MICROSCOPIC
Bilirubin Urine: NEGATIVE
Glucose, UA: NEGATIVE mg/dL
Ketones, ur: NEGATIVE mg/dL
LEUKOCYTES UA: NEGATIVE
NITRITE: NEGATIVE
Protein, ur: 100 mg/dL — AB
SPECIFIC GRAVITY, URINE: 1.006 (ref 1.005–1.030)
UROBILINOGEN UA: 0.2 mg/dL (ref 0.0–1.0)
pH: 7.5 (ref 5.0–8.0)

## 2015-02-26 LAB — GLUCOSE, CAPILLARY
GLUCOSE-CAPILLARY: 118 mg/dL — AB (ref 65–99)
GLUCOSE-CAPILLARY: 106 mg/dL — AB (ref 65–99)

## 2015-02-26 LAB — TROPONIN I: Troponin I: <0.03 ng/mL (ref <0.031)

## 2015-02-26 MED ORDER — MECLIZINE HCL 25 MG PO TABS
25.0000 mg | ORAL_TABLET | Freq: Three times a day (TID) | ORAL | Status: DC
  Administered 2015-02-26 – 2015-02-28 (×6): 25 mg via ORAL
  Filled 2015-02-26 (×6): qty 1

## 2015-02-26 MED ORDER — NITROGLYCERIN 0.4 MG SL SUBL: 0.4000 mg | SUBLINGUAL_TABLET | SUBLINGUAL | Status: DC | PRN

## 2015-02-26 MED ORDER — PROMETHAZINE HCL 25 MG PO TABS: 12.5000 mg | ORAL_TABLET | Freq: Four times a day (QID) | ORAL | Status: DC | PRN

## 2015-02-26 MED ORDER — MEPERIDINE HCL 50 MG PO TABS: 50.0000 mg | ORAL_TABLET | ORAL | Status: DC | PRN

## 2015-02-26 MED ORDER — SODIUM CHLORIDE 0.9 % IV BOLUS (SEPSIS)
500.0000 mL | Freq: Once | INTRAVENOUS | Status: AC
  Administered 2015-02-26: 500 mL via INTRAVENOUS

## 2015-02-26 MED ORDER — ASPIRIN 325 MG PO TABS
325.0000 mg | ORAL_TABLET | Freq: Every day | ORAL | Status: DC
  Administered 2015-02-26 – 2015-02-27 (×2): 325 mg via ORAL
  Filled 2015-02-26 (×2): qty 1

## 2015-02-26 MED ORDER — HYDRALAZINE HCL 20 MG/ML IJ SOLN
5.0000 mg | Freq: Once | INTRAMUSCULAR | Status: AC
  Administered 2015-02-26: 5 mg via INTRAVENOUS
  Filled 2015-02-26: qty 1

## 2015-02-26 MED ORDER — ONDANSETRON HCL 4 MG PO TABS: 4.0000 mg | ORAL_TABLET | Freq: Four times a day (QID) | ORAL | Status: DC | PRN

## 2015-02-26 MED ORDER — SODIUM CHLORIDE 0.9 % IV SOLN: INTRAVENOUS | Status: DC

## 2015-02-26 MED ORDER — OLMESARTAN MEDOXOMIL 20 MG PO TABS
20.0000 mg | ORAL_TABLET | Freq: Every day | ORAL | Status: DC
  Administered 2015-02-26 – 2015-02-28 (×3): 20 mg via ORAL
  Filled 2015-02-26 (×3): qty 1

## 2015-02-26 MED ORDER — LORATADINE 10 MG PO TABS
10.0000 mg | ORAL_TABLET | Freq: Every day | ORAL | Status: DC
  Administered 2015-02-26 – 2015-02-28 (×3): 10 mg via ORAL
  Filled 2015-02-26 (×3): qty 1

## 2015-02-26 MED ORDER — DICYCLOMINE HCL 20 MG PO TABS
20.0000 mg | ORAL_TABLET | Freq: Two times a day (BID) | ORAL | Status: DC
  Administered 2015-02-26 – 2015-02-27 (×2): 20 mg via ORAL
  Filled 2015-02-26 (×3): qty 1

## 2015-02-26 MED ORDER — CYCLOBENZAPRINE HCL 10 MG PO TABS
5.0000 mg | ORAL_TABLET | Freq: Three times a day (TID) | ORAL | Status: DC | PRN
  Administered 2015-02-26: 5 mg via ORAL
  Filled 2015-02-26: qty 1

## 2015-02-26 MED ORDER — CALCITONIN (SALMON) 200 UNIT/ACT NA SOLN
1.0000 | Freq: Every day | NASAL | Status: DC
  Administered 2015-02-26 – 2015-02-27 (×2): 1 via NASAL
  Filled 2015-02-26: qty 3.7

## 2015-02-26 MED ORDER — CARVEDILOL 25 MG PO TABS
25.0000 mg | ORAL_TABLET | Freq: Two times a day (BID) | ORAL | Status: DC
Start: 2015-02-26 — End: 2015-02-28
  Administered 2015-02-26 – 2015-02-28 (×4): 25 mg via ORAL
  Filled 2015-02-26 (×4): qty 1

## 2015-02-26 MED ORDER — ONDANSETRON HCL 4 MG/2ML IJ SOLN
4.0000 mg | Freq: Once | INTRAMUSCULAR | Status: AC
  Administered 2015-02-26: 4 mg via INTRAVENOUS
  Filled 2015-02-26: qty 2

## 2015-02-26 MED ORDER — SODIUM CHLORIDE 0.9 % IV SOLN
INTRAVENOUS | Status: DC
  Administered 2015-02-26: 125 mL/h via INTRAVENOUS

## 2015-02-26 MED ORDER — CLONIDINE HCL 0.1 MG PO TABS
0.1000 mg | ORAL_TABLET | Freq: Two times a day (BID) | ORAL | Status: DC
Start: 2015-02-26 — End: 2015-02-28
  Administered 2015-02-26 – 2015-02-28 (×5): 0.1 mg via ORAL
  Filled 2015-02-26 (×4): qty 1

## 2015-02-26 MED ORDER — SIMVASTATIN 20 MG PO TABS
20.0000 mg | ORAL_TABLET | ORAL | Status: DC
  Administered 2015-02-26 – 2015-02-28 (×2): 20 mg via ORAL
  Filled 2015-02-26 (×3): qty 1

## 2015-02-26 MED ORDER — CLONIDINE HCL 0.1 MG PO TABS
0.1000 mg | ORAL_TABLET | Freq: Once | ORAL | Status: DC
  Filled 2015-02-26: qty 1

## 2015-02-26 MED ORDER — DIPHENHYDRAMINE HCL 12.5 MG/5ML PO ELIX: 12.5000 mg | ORAL_SOLUTION | Freq: Every evening | ORAL | Status: DC | PRN

## 2015-02-26 MED ORDER — ONDANSETRON HCL 4 MG/2ML IJ SOLN
4.0000 mg | Freq: Four times a day (QID) | INTRAMUSCULAR | Status: DC | PRN
  Administered 2015-02-26: 4 mg via INTRAVENOUS
  Filled 2015-02-26 (×2): qty 2

## 2015-02-26 MED ORDER — SODIUM CHLORIDE 0.9 % IV SOLN
INTRAVENOUS | Status: AC
  Administered 2015-02-26: 100 mL/h via INTRAVENOUS

## 2015-02-26 MED ORDER — PROMETHAZINE HCL 25 MG/ML IJ SOLN
12.5000 mg | Freq: Once | INTRAMUSCULAR | Status: AC
  Administered 2015-02-26: 12.5 mg via INTRAVENOUS
  Filled 2015-02-26: qty 1

## 2015-02-26 MED ORDER — CARVEDILOL 25 MG PO TABS
25.0000 mg | ORAL_TABLET | Freq: Once | ORAL | Status: DC
  Filled 2015-02-26: qty 1

## 2015-02-26 MED ORDER — ZOLPIDEM TARTRATE 5 MG PO TABS
5.0000 mg | ORAL_TABLET | Freq: Every evening | ORAL | Status: DC | PRN
  Administered 2015-02-26 – 2015-02-27 (×2): 5 mg via ORAL
  Filled 2015-02-26 (×2): qty 1

## 2015-02-26 MED ORDER — INSULIN ASPART 100 UNIT/ML ~~LOC~~ SOLN: 0.0000 [IU] | Freq: Three times a day (TID) | SUBCUTANEOUS | Status: DC

## 2015-02-26 MED ORDER — DICLOFENAC SODIUM 1 % TD GEL
4.0000 g | Freq: Four times a day (QID) | TRANSDERMAL | Status: DC | PRN
  Filled 2015-02-26: qty 100

## 2015-02-26 MED ORDER — PANTOPRAZOLE SODIUM 40 MG PO TBEC
40.0000 mg | DELAYED_RELEASE_TABLET | Freq: Every day | ORAL | Status: DC
  Administered 2015-02-26 – 2015-02-28 (×3): 40 mg via ORAL
  Filled 2015-02-26 (×3): qty 1

## 2015-02-26 MED ORDER — ENOXAPARIN SODIUM 30 MG/0.3ML ~~LOC~~ SOLN
30.0000 mg | SUBCUTANEOUS | Status: DC
  Administered 2015-02-26 – 2015-02-27 (×2): 30 mg via SUBCUTANEOUS
  Filled 2015-02-26 (×2): qty 0.3

## 2015-02-26 NOTE — ED Notes (Signed)
Upon further interview patient reporting shob, neck pain, jaw pain and leg pain. Pt asking husband if she's died.

## 2015-02-26 NOTE — Progress Notes (Signed)
Called patient's [facility] to ask about antibiotic prescribed for UTI.  Pharmacist informed me that on 11/11, a prescription was filled for Levaquin 250mg  PO daily. Today, a new prescription was called on for Levaquin 750mg  PO every other day. This has not yet been picked up.  If patient is asymptomatic and UA is not suspicious, would NOT recommend treating UTI. In addition, would not recommend the use of a flouroquinolone in an 81 year old patient with an uncomplicated UTI as recommended by the FDA warning from June of 2016 cautioning against these agents' use d/t  warnings for tendinitis, tendon rupture, and worsening of myasthenia gravis, along with peripheral neuropathy, CNS effects, and cardiac, dermatologic, and hypersensitivity reactions.  Lus Kriegel D. Braiden Rodman, PharmD, BCPS Clinical Pharmacist Pager: [phone] 02/26/2015 12:05 PM

## 2015-02-26 NOTE — H&P (Signed)
[facility] History and Physical  [name] [id number] DOB: 09-07-1931 DOA: 02/26/2015  Referring physician: Emergency Department PCP: Irven Shelling, MD   CHIEF COMPLAINT: weakness   HPI: [name] is a 80 y.o. female  with multiple medical problems not limited to diabetes, coronary artery disease, hypertension  and renal artery stenosis. Patient was admitted to the hospital late October with left flank pain / hyponatremia. HCTZ stopped, Na+ improved. Left flank pain workup negative, felt to be musculoskeletal in nature.  Patient returns to the emergency department this morning after waking up around 6 AM feeling "like I was having a heart attack". She had terrible left flank pain but also endorses chest pain. Chest pain nonradiating, described as burning sensation. No shortness of breath. Patient takes twice daily PPI. Currently no chest pain. According to patient's husband, left flank pain has persisted since hospital discharge in October. Pain is worse with standing and physical activity  Patient also endorses intermittent nausea with one episode of vomiting in the last 2 weeks. She also had to 3 days of loose stool but none in the last 3 days.Drinking a lot of water at home. Reports 12 pound weight loss in 2 weeks.   She currently on antibiotics for UTI. PCP called husband this morning, said repeat urine shows that a"stronger antibiotic will be needed"   ED COURSE:     Labs:   Sodium 124, chloride 20, BUN 12, creatinine 1.11, troponin normal 0.03, white count normal, hematocrit low at 32  Urinalysis:   Clear, rare bacteria, specific gravity 1.006  CXR:   New compression fracture T12. Heart and lungs appear normal  EKG:    Sinus rhythm Left ventricular hypertrophy Inferior infarct, old   Medications  carvedilol (COREG) tablet 25 mg (not administered)  cloNIDine (CATAPRES) tablet 0.1 mg (not administered)  sodium chloride 0.9 % bolus 500 mL (not  administered)  0.9 %  sodium chloride infusion (not administered)  promethazine (PHENERGAN) injection 12.5 mg (not administered)  ondansetron (ZOFRAN) injection 4 mg (4 mg Intravenous Given 02/26/15 0655)  hydrALAZINE (APRESOLINE) injection 5 mg (5 mg Intravenous Given 02/26/15 0820)   Review of Systems  Constitutional: Positive for chills.  HENT: Negative.   Eyes: Negative.   Respiratory: Negative.   Cardiovascular: Positive for chest pain.  Gastrointestinal: Positive for vomiting and diarrhea.  Genitourinary: Positive for flank pain.  Neurological: Positive for weakness.  Endo/Heme/Allergies: Negative.   Psychiatric/Behavioral: Negative.     Past Medical History  Diagnosis Date  . CAD (coronary artery disease)   . Renal artery stenosis ([place])   . HTN (hypertension)   . Dyslipidemia   . DM (diabetes mellitus) ([place])   . GERD (gastroesophageal reflux disease)   . Osteoporosis   . Meniere disease     "for 20 years" takes Antivert 3 times per day   Past Surgical History  Procedure Laterality Date  . Right hip fx 2011    . Left wrist fx 2012    . Abdominal hysterectomy    . Cholecystectomy    . Bladder tac      x3  . Cardiac catheterization    . Neck sx    . Breast surgery      breast reduction 1985  . Left renal stent    . Peripheral vascular catheterization Left 12/20/2014    Procedure: Renal Angiography;  Surgeon: Wellington Hampshire, MD;  Location: [place] [facility];  Service: Cardiovascular;  Laterality: Left;    SOCIAL  HISTORY:  reports that she has quit smoking. She has never used smokeless tobacco. She reports that she does not drink alcohol or use illicit drugs. Lives: at home with husband Assistive devices:   None needed for ambulation.   Allergies  Allergen Reactions  . Fentanyl Anaphylaxis  . Morphine And Related Anaphylaxis    Per Dr. Conley Canal patient has taken percocet without issue  . Actamin [Acetaminophen] Rash  . Actonel [Risedronate Sodium] Rash  .  Amlodipine     diarrhea  . Avapro [Irbesartan] Rash  . Cardio Complete [Nutritional Supplements] Rash  . Cardizem [Diltiazem Hcl] Rash  . Ciprofloxacin Rash  . Codeine Rash  . Dilaudid [Hydromorphone Hcl] Rash  . Forteo [Teriparatide (Recombinant)] Rash  . Fosamax [Alendronate Sodium] Rash  . Hydralazine Diarrhea  . Inapsine [Droperidol] Rash  . Ivp Dye [Iodinated Diagnostic Agents] Rash  . Lipitor [Atorvastatin] Rash  . Lisinopril Rash  . Micardis [Telmisartan] Rash  . Septra [Sulfamethoxazole-Trimethoprim] Rash  . Shellfish Allergy Rash  . Tekturna [Aliskiren] Rash  . Tricor [Fenofibrate] Rash  . Tussionex Pennkinetic Er [Hydrocod Polst-Cpm Polst Er] Rash    Family History  Problem Relation Age of Onset  . Diabetes type II Mother   . Heart attack Mother   . Heart disease Mother   . Congestive Heart Failure Father   . Pulmonary embolism Father   . Alzheimer's disease Sister   . Heart disease Sister   . Heart disease Brother   . Healthy Sister   . Healthy Sister   . Stroke Brother   . Dementia Brother     VASCULAR  . Stroke Brother   . Diabetes type II Brother   . Other Brother     KILLED IN PLANE CRASH  . Pulmonary disease Sister   . Heart attack Sister   . Kidney failure Sister   . Diabetes type II Sister   . Diabetes type II Sister   . Kidney failure Sister     Prior to Admission medications   Medication Sig Start Date End Date Taking? Authorizing Provider  ACCU-CHEK COMPACT PLUS test strip 1 each by Other route See admin instructions. Check blood sugar at bedtime 09/22/14  Yes Historical Provider, MD  ACCU-CHEK SOFTCLIX LANCETS lancets 1 each by Other route See admin instructions. Check blood sugar at bedtime 10/09/14  Yes Historical Provider, MD  aspirin 325 MG tablet Take 325 mg by mouth at bedtime.   Yes Historical Provider, MD  betamethasone dipropionate (DIPROLENE) 0.05 % cream Apply 1 application topically daily as needed (irritated skin).   Yes  Historical Provider, MD  Calcium Citrate (CITRACAL PO) Take 1,200 mg by mouth daily.   Yes Historical Provider, MD  carvedilol (COREG) 25 MG tablet Take 25 mg by mouth 2 (two) times daily with a meal.   Yes Historical Provider, MD  cholecalciferol (VITAMIN D) 1000 UNITS tablet Take 2,000 Units by mouth daily.    Yes Historical Provider, MD  cloNIDine (CATAPRES) 0.1 MG tablet Take 1 tablet (0.1 mg total) by mouth 2 (two) times daily. 01/02/15 01/18/16 Yes Wellington Hampshire, MD  Cod Liver Oil CAPS Take 1 capsule by mouth daily.   Yes Historical Provider, MD  cyclobenzaprine (FLEXERIL) 5 MG tablet Take 1 tablet (5 mg total) by mouth 3 (three) times daily as needed for muscle spasms. 02/07/15  Yes Albertine Patricia, MD  dicyclomine (BENTYL) 20 MG tablet Take 1 tablet (20 mg total) by mouth 2 (two) times daily. 01/30/15  Yes Alvina Chou, PA-C  esomeprazole (NEXIUM) 40 MG capsule Take 40 mg by mouth 2 (two) times daily.   Yes Historical Provider, MD  fexofenadine (ALLEGRA) 180 MG tablet Take 180 mg by mouth daily.   Yes Historical Provider, MD  meclizine (ANTIVERT) 25 MG tablet Take 25 mg by mouth 3 (three) times daily.    Yes Historical Provider, MD  Multiple Vitamins-Minerals (PRESERVISION AREDS) CAPS Take 1 capsule by mouth 2 (two) times daily.    Yes Historical Provider, MD  nitroGLYCERIN (NITROSTAT) 0.4 MG SL tablet Place 0.4 mg under the tongue every 5 (five) minutes as needed for chest pain (3 doses MAX).   Yes Historical Provider, MD  olmesartan (BENICAR) 40 MG tablet Take 20 mg by mouth daily.    Yes Historical Provider, MD  PREMARIN vaginal cream Place 1-2 g vaginally 2 (two) times a week. 10/06/14  Yes Historical Provider, MD  promethazine (PHENERGAN) 25 MG tablet Take 0.5 tablets (12.5 mg total) by mouth every 6 (six) hours as needed for nausea or vomiting. 01/30/15  Yes Alvina Chou, PA-C  simvastatin (ZOCOR) 20 MG tablet Take 20 mg by mouth every Monday, Wednesday, and Friday.  10/14/11   Yes Historical Provider, MD  VITAMIN E PO Take 1 capsule by mouth daily.   Yes Historical Provider, MD   PHYSICAL EXAM: Filed Vitals:   02/26/15 0845 02/26/15 0900 02/26/15 0915 02/26/15 0921  BP: 151/60 159/57 171/52 171/52  Pulse: 61 63 65 69  Temp:      TempSrc:      Resp: 24 24  20   SpO2: 98% 98% 98% 98%    Wt Readings from Last 3 Encounters:  02/05/15 56.337 kg (124 lb 3.2 oz)  01/29/15 55.821 kg (123 lb 1 oz)  01/02/15 57.063 kg (125 lb 12.8 oz)    General:  Pleasant white  female. Appears calm and comfortable Eyes: PER, normal lids, irises & conjunctiva ENT: grossly normal hearing, lips & tongue Neck: no LAD, no masses Cardiovascular: RRR,  No LE edema.  Respiratory: Respirations even and unlabored. Normal respiratory effort. Lungs CTA bilaterally, no wheezes / rales .   Abdomen: soft, non-distended, non-tender, active bowel sounds. No obvious masses.  Skin: no rash seen on limited exam Musculoskeletal: grossly normal tone BUE/BLE Psychiatric: grossly normal mood and affect, speech fluent and appropriate Neurologic: grossly non-focal.         LABS ON ADMISSION:    Basic Metabolic Panel:  Recent Labs Lab 02/26/15 0734  NA 124*  K 3.5  CL 93*  CO2 20*  GLUCOSE 148*  BUN 12  CREATININE 1.11*  CALCIUM 8.6*   Liver Function Tests:  Recent Labs Lab 02/26/15 0734  AST 22  ALT 14  ALKPHOS 52  BILITOT 0.6  PROT 6.0*  ALBUMIN 3.1*    Recent Labs Lab 02/26/15 0734  LIPASE 44    CBC:  Recent Labs Lab 02/26/15 0734  WBC 5.6  HGB 12.0  HCT 32.5*  MCV 79.9  PLT 183     Radiological Exams on Admission: Dg Chest 2 View  02/26/2015  CLINICAL DATA:  Chest pain. Nausea and vomiting. Weakness. Neck pain. EXAM: CHEST  2 VIEW COMPARISON:  12/21/2011 and 12/12/2004 and CT scan of the abdomen dated 01/30/2015 FINDINGS: Heart size and pulmonary vascularity are normal and the lungs are clear. The patient has developed a compression fracture of the  inferior aspect T12 since the prior CT scan 01/30/2015. Calcification in the thoracic aorta. IMPRESSION: New compression  fracture of the inferior aspect of T12 since 01/30/2015. Heart and lungs appear normal. Aortic atherosclerosis. Electronically Signed   By: Lorriane Shire M.D.   On: 02/26/2015 07:56    ASSESSMENT / PLAN    Left flank pain / nausea. Both present during October visit. Flank pain workup unremarkable including CT scan, renal ultrasound, abdominal series. Pain worse in standing position and with physical activity suggesting musculoskeletal etiology though reports ongoing pain despite muscle relaxers. -Admit to observation to telemetry bed -Analgesics as needed and antiemetics as needed   Compression fracture of T12, new  No recent fall / injury patient can recall. She has recently pulling weeds at home however. Seems unlikely to be source of left flank pain since this is same pain as last admission and no compression fracture was found at that time.  -supportive care  UTI.  Patient on antibiotics at home under direction of PCP. Called by PCPs office today and per husband urine still abnormal and patient needs "stronger antibiotics". -Today's urinalysis does not look overly suspicious. Will send for culture  Hyponatremia. Unclear etiology. HCTZ discontinued upon discharge in late October but patient still taking 1 /2 Maxide daily. Sodium had risen to 133 by discharge last October, currently down to 124. Hyponatremia possibly multifactorial (recent loose stool, poor salt intake, maxide ). CKD may also be contributing.  - hydrate with normal saline.  -am BMET  Hypertension. Blood pressure has been difficult to manage with multiple medication intolerances including Hydralazine -BP better after taking home meds in ED.  -Continue home Benicar, Coreg, and Catapres  Diabetes, diet controlled.  Last hemoglobin A1c 6.5 late October.  -CBG 148 today. Will monitor CBGs inpatient and use  sliding scale insulin  History of left renal artery stenosis, s/p stent placement 2012  Chronic kidney disease, stage  2-3. Needs outpatient evaluation for chronic kidney. Creatinine actually better today than her baseline.   GERD. Stable on BID PPI.  -Continue home PPI  Multiple drug intolerances.   CONSULTANTS:   none  Code Status: full DVT Prophylaxis:  lovenox Family Communication:  Patient alert, oriented and understands plan of care.  Disposition Plan: Discharge to home in 24-48 hours   Time spent: 60 minutes Tye Savoy  NP [facility] Pager [phone]

## 2015-02-26 NOTE — ED Provider Notes (Signed)
CSN: [id number]     Arrival date & time 02/26/15  [id number] History   First MD Initiated Contact with Patient 02/26/15 [phone]     Chief Complaint  Patient presents with  . Chest Pain     (Consider location/radiation/quality/duration/timing/severity/associated sxs/prior Treatment) HPI Patient has had ongoing problems with feeling unwell for several weeks. She has been discharged from the hospital at the end of October with left flank pain of uncertain etiology, suspected musculoskeletal after extensive diagnostic evaluation. She had hyponatremia and renal insufficiency. The patient's husband specifies that she's felt worse since yesterday evening. She describes general nausea, feeling ill and ongoing left flank pain, left chest pain, left neck pain. Her husband reports that they monitor her blood pressure regularly, usually her blood pressure is from the [id number] to [id number] systolic. They have instructions to take a clonidine if the systolic blood pressure goes to 200 over. They report compliance of blood pressure medications however she has not had her morning medications. She has had problems with chronic elevated blood pressure. Patient has a history of renal artery stenosis. She did have an angiogram done within the past 6 weeks. Her stent is occluded and there was no revascularization performed. She has had some ongoing diarrhea, no vomiting. No lower extremity swelling. No significant cough (her husband reports at the beginning of the year she had 3 months of coughing which is now improved). Past Medical History  Diagnosis Date  . CAD (coronary artery disease)   . Renal artery stenosis ([place])   . HTN (hypertension)   . Dyslipidemia   . DM (diabetes mellitus) ([place])   . GERD (gastroesophageal reflux disease)   . Osteoporosis   . Meniere disease     "for 20 years" takes Antivert 3 times per day   Past Surgical History  Procedure Laterality Date  . Right hip fx 2011    . Left wrist fx 2012    .  Abdominal hysterectomy    . Cholecystectomy    . Bladder tac      x3  . Cardiac catheterization    . Neck sx    . Breast surgery      breast reduction 1985  . Left renal stent    . Peripheral vascular catheterization Left 12/20/2014    Procedure: Renal Angiography;  Surgeon: Wellington Hampshire, MD;  Location: [facility];  Service: Cardiovascular;  Laterality: Left;   Family History  Problem Relation Age of Onset  . Diabetes type II Mother   . Heart attack Mother   . Heart disease Mother   . Congestive Heart Failure Father   . Pulmonary embolism Father   . Alzheimer's disease Sister   . Heart disease Sister   . Heart disease Brother   . Healthy Sister   . Healthy Sister   . Stroke Brother   . Dementia Brother     VASCULAR  . Stroke Brother   . Diabetes type II Brother   . Other Brother     KILLED IN PLANE CRASH  . Pulmonary disease Sister   . Heart attack Sister   . Kidney failure Sister   . Diabetes type II Sister   . Diabetes type II Sister   . Kidney failure Sister    Social History  Substance Use Topics  . Smoking status: Former Research scientist (life sciences)  . Smokeless tobacco: Never Used  . Alcohol Use: No   OB History    No data available     Review  of Systems  10 Systems reviewed and are negative for acute change except as noted in the HPI.   Allergies  Fentanyl; Morphine and related; Actamin; Actonel; Amlodipine; Avapro; Cardio complete; Cardizem; Ciprofloxacin; Codeine; Dilaudid; Forteo; Fosamax; Hydralazine; Inapsine; Ivp dye; Lipitor; Lisinopril; Micardis; Septra; Shellfish allergy; Tekturna; Tricor; and Tussionex pennkinetic er  Home Medications   Prior to Admission medications   Medication Sig Start Date End Date Taking? Authorizing Provider  ACCU-CHEK COMPACT PLUS test strip 1 each by Other route See admin instructions. Check blood sugar at bedtime 09/22/14  Yes Historical Provider, MD  ACCU-CHEK SOFTCLIX LANCETS lancets 1 each by Other route See admin  instructions. Check blood sugar at bedtime 10/09/14  Yes Historical Provider, MD  aspirin 325 MG tablet Take 325 mg by mouth at bedtime.   Yes Historical Provider, MD  betamethasone dipropionate (DIPROLENE) 0.05 % cream Apply 1 application topically daily as needed (irritated skin).   Yes Historical Provider, MD  Calcium Citrate (CITRACAL PO) Take 1,200 mg by mouth daily.   Yes Historical Provider, MD  carvedilol (COREG) 25 MG tablet Take 25 mg by mouth 2 (two) times daily with a meal.   Yes Historical Provider, MD  cholecalciferol (VITAMIN D) 1000 UNITS tablet Take 2,000 Units by mouth daily.    Yes Historical Provider, MD  cloNIDine (CATAPRES) 0.1 MG tablet Take 1 tablet (0.1 mg total) by mouth 2 (two) times daily. 01/02/15 01/18/16 Yes Wellington Hampshire, MD  Cod Liver Oil CAPS Take 1 capsule by mouth daily.   Yes Historical Provider, MD  cyclobenzaprine (FLEXERIL) 5 MG tablet Take 1 tablet (5 mg total) by mouth 3 (three) times daily as needed for muscle spasms. 02/07/15  Yes Albertine Patricia, MD  dicyclomine (BENTYL) 20 MG tablet Take 1 tablet (20 mg total) by mouth 2 (two) times daily. 01/30/15  Yes Kaitlyn Szekalski, PA-C  esomeprazole (NEXIUM) 40 MG capsule Take 40 mg by mouth 2 (two) times daily.   Yes Historical Provider, MD  fexofenadine (ALLEGRA) 180 MG tablet Take 180 mg by mouth daily.   Yes Historical Provider, MD  meclizine (ANTIVERT) 25 MG tablet Take 25 mg by mouth 3 (three) times daily.    Yes Historical Provider, MD  Multiple Vitamins-Minerals (PRESERVISION AREDS) CAPS Take 1 capsule by mouth 2 (two) times daily.    Yes Historical Provider, MD  nitroGLYCERIN (NITROSTAT) 0.4 MG SL tablet Place 0.4 mg under the tongue every 5 (five) minutes as needed for chest pain (3 doses MAX).   Yes Historical Provider, MD  olmesartan (BENICAR) 40 MG tablet Take 20 mg by mouth daily.    Yes Historical Provider, MD  PREMARIN vaginal cream Place 1-2 g vaginally 2 (two) times a week. 10/06/14  Yes  Historical Provider, MD  promethazine (PHENERGAN) 25 MG tablet Take 0.5 tablets (12.5 mg total) by mouth every 6 (six) hours as needed for nausea or vomiting. 01/30/15  Yes Alvina Chou, PA-C  simvastatin (ZOCOR) 20 MG tablet Take 20 mg by mouth every Monday, Wednesday, and Friday.  10/14/11  Yes Historical Provider, MD  VITAMIN E PO Take 1 capsule by mouth daily.   Yes Historical Provider, MD   BP 165/65 mmHg  Pulse 66  Temp(Src) 97.7 F (36.5 C) (Rectal)  Resp 20  SpO2 98% Physical Exam  Constitutional: She is oriented to person, place, and time. She appears well-developed and well-nourished.  HENT:  Head: Normocephalic and atraumatic.  Eyes: EOM are normal. Pupils are equal, round, and reactive to  light.  Neck: Neck supple.  Cardiovascular: Normal rate, regular rhythm and intact distal pulses.   2/6 systolic ejection murmur.  Pulmonary/Chest: Effort normal and breath sounds normal.  Abdominal: Soft. Bowel sounds are normal. She exhibits no distension. There is no tenderness.  Musculoskeletal: Normal range of motion. She exhibits no edema.  Neurological: She is alert and oriented to person, place, and time. She has normal strength. Coordination normal. GCS eye subscore is 4. GCS verbal subscore is 5. GCS motor subscore is 6.  Skin: Skin is warm, dry and intact.  Psychiatric: She has a normal mood and affect.    ED Course  Procedures (including critical care time) Labs Review Labs Reviewed  BASIC METABOLIC PANEL - Abnormal; Notable for the following:    Sodium 124 (*)    Chloride 93 (*)    CO2 20 (*)    Glucose, Bld 148 (*)    Creatinine, Ser 1.11 (*)    Calcium 8.6 (*)    GFR calc non Af Amer 45 (*)    GFR calc Af Amer 53 (*)    All other components within normal limits  CBC - Abnormal; Notable for the following:    HCT 32.5 (*)    MCHC 36.9 (*)    All other components within normal limits  URINALYSIS, ROUTINE W REFLEX MICROSCOPIC (NOT AT [facility]) - Abnormal; Notable  for the following:    Hgb urine dipstick TRACE (*)    Protein, ur 100 (*)    All other components within normal limits  HEPATIC FUNCTION PANEL - Abnormal; Notable for the following:    Total Protein 6.0 (*)    Albumin 3.1 (*)    All other components within normal limits  URINE MICROSCOPIC-ADD ON - Abnormal; Notable for the following:    Squamous Epithelial / LPF FEW (*)    All other components within normal limits  TROPONIN I  LIPASE, BLOOD  I-STAT TROPOININ, ED    Imaging Review Dg Chest 2 View  02/26/2015  CLINICAL DATA:  Chest pain. Nausea and vomiting. Weakness. Neck pain. EXAM: CHEST  2 VIEW COMPARISON:  12/21/2011 and 12/12/2004 and CT scan of the abdomen dated 01/30/2015 FINDINGS: Heart size and pulmonary vascularity are normal and the lungs are clear. The patient has developed a compression fracture of the inferior aspect T12 since the prior CT scan 01/30/2015. Calcification in the thoracic aorta. IMPRESSION: New compression fracture of the inferior aspect of T12 since 01/30/2015. Heart and lungs appear normal. Aortic atherosclerosis. Electronically Signed   By: Lorriane Shire M.D.   On: 02/26/2015 07:56   I have personally reviewed and evaluated these images and lab results as part of my medical decision-making.   EKG Interpretation   Date/Time:  Monday February 26 2015 06:53:55 EST Ventricular Rate:  69 PR Interval:  172 QRS Duration: 100 QT Interval:  449 QTC Calculation: 481 R Axis:   62 Text Interpretation:  Sinus rhythm Left ventricular hypertrophy Inferior  infarct, old agree. no change Confirmed by Johnney Killian, MD, Jeannie Done [phone]) on  02/26/2015 6:58:58 AM      MDM   Final diagnoses:  Hyponatremia  Nausea  Generalized weakness   Patient presents with increasing weakness and nausea. She also complains of generalized malaise type symptoms and pain in her left flank that she has had for months. At this time pertinent finding is hyponatremia. Previously her  hyponatremia was thought likely to her thiazide diuretic and this was discontinued. At this time she is again  hyponatremic without known etiology. The patient also has significant hypertension which reportedly is difficult to control. She does have renal artery stenosis that was evaluated by angiogram, although the stent was occluded, based on renal size, the kidney was still thought to be functional. At this point the patient will be admitted for treatment of hyponatremia, generalized weakness and hypertension. She has multiple severe medical comorbidities.    Charlesetta Shanks, MD 02/26/15 [phone]

## 2015-02-26 NOTE — ED Notes (Addendum)
Pt arrives via EMS with substernal CP, HTN. Nauseated. Tachypnic. HX MI. Pt reports currently treated for UTI. 22G L hand, no nitro or aspirin on board. Pt drowsy in triage.

## 2015-02-26 NOTE — ED Notes (Signed)
Pt states she is nauseated and cannot take oral htn meds.  States zofran initially given did nothing for nausea.  Dr Johnney Killian notified.

## 2015-02-26 NOTE — ED Notes (Signed)
Patient transported to X-ray 

## 2015-02-27 ENCOUNTER — Observation Stay (HOSPITAL_COMMUNITY): Payer: Medicare Other

## 2015-02-27 DIAGNOSIS — M4854XA Collapsed vertebra, not elsewhere classified, thoracic region, initial encounter for fracture: Secondary | ICD-10-CM | POA: Diagnosis present

## 2015-02-27 DIAGNOSIS — Z7982 Long term (current) use of aspirin: Secondary | ICD-10-CM | POA: Diagnosis not present

## 2015-02-27 DIAGNOSIS — I129 Hypertensive chronic kidney disease with stage 1 through stage 4 chronic kidney disease, or unspecified chronic kidney disease: Secondary | ICD-10-CM | POA: Diagnosis present

## 2015-02-27 DIAGNOSIS — E871 Hypo-osmolality and hyponatremia: Secondary | ICD-10-CM | POA: Diagnosis present

## 2015-02-27 DIAGNOSIS — Z9842 Cataract extraction status, left eye: Secondary | ICD-10-CM | POA: Diagnosis not present

## 2015-02-27 DIAGNOSIS — H8109 Meniere's disease, unspecified ear: Secondary | ICD-10-CM | POA: Diagnosis present

## 2015-02-27 DIAGNOSIS — Z79899 Other long term (current) drug therapy: Secondary | ICD-10-CM | POA: Diagnosis not present

## 2015-02-27 DIAGNOSIS — N390 Urinary tract infection, site not specified: Secondary | ICD-10-CM | POA: Diagnosis present

## 2015-02-27 DIAGNOSIS — I2510 Atherosclerotic heart disease of native coronary artery without angina pectoris: Secondary | ICD-10-CM | POA: Diagnosis present

## 2015-02-27 DIAGNOSIS — K210 Gastro-esophageal reflux disease with esophagitis: Secondary | ICD-10-CM

## 2015-02-27 DIAGNOSIS — Z9841 Cataract extraction status, right eye: Secondary | ICD-10-CM | POA: Diagnosis not present

## 2015-02-27 DIAGNOSIS — E860 Dehydration: Secondary | ICD-10-CM | POA: Diagnosis present

## 2015-02-27 DIAGNOSIS — I701 Atherosclerosis of renal artery: Secondary | ICD-10-CM

## 2015-02-27 DIAGNOSIS — M810 Age-related osteoporosis without current pathological fracture: Secondary | ICD-10-CM | POA: Diagnosis present

## 2015-02-27 DIAGNOSIS — E0801 Diabetes mellitus due to underlying condition with hyperosmolarity with coma: Secondary | ICD-10-CM

## 2015-02-27 DIAGNOSIS — Z87891 Personal history of nicotine dependence: Secondary | ICD-10-CM | POA: Diagnosis not present

## 2015-02-27 DIAGNOSIS — R109 Unspecified abdominal pain: Secondary | ICD-10-CM | POA: Diagnosis present

## 2015-02-27 DIAGNOSIS — Z961 Presence of intraocular lens: Secondary | ICD-10-CM | POA: Diagnosis present

## 2015-02-27 DIAGNOSIS — I252 Old myocardial infarction: Secondary | ICD-10-CM | POA: Diagnosis not present

## 2015-02-27 DIAGNOSIS — E785 Hyperlipidemia, unspecified: Secondary | ICD-10-CM | POA: Diagnosis present

## 2015-02-27 DIAGNOSIS — E1122 Type 2 diabetes mellitus with diabetic chronic kidney disease: Secondary | ICD-10-CM | POA: Diagnosis present

## 2015-02-27 DIAGNOSIS — R531 Weakness: Secondary | ICD-10-CM

## 2015-02-27 DIAGNOSIS — Z981 Arthrodesis status: Secondary | ICD-10-CM | POA: Diagnosis not present

## 2015-02-27 DIAGNOSIS — N183 Chronic kidney disease, stage 3 (moderate): Secondary | ICD-10-CM | POA: Diagnosis present

## 2015-02-27 DIAGNOSIS — K219 Gastro-esophageal reflux disease without esophagitis: Secondary | ICD-10-CM | POA: Diagnosis present

## 2015-02-27 LAB — GLUCOSE, CAPILLARY
Glucose-Capillary: 125 mg/dL — ABNORMAL HIGH (ref 65–99)
Glucose-Capillary: 122 mg/dL — ABNORMAL HIGH (ref 65–99)
Glucose-Capillary: 166 mg/dL — ABNORMAL HIGH (ref 65–99)
Glucose-Capillary: 124 mg/dL — ABNORMAL HIGH (ref 65–99)

## 2015-02-27 LAB — BASIC METABOLIC PANEL
ANION GAP: 7 (ref 5–15)
BUN: 12 mg/dL (ref 6–20)
CALCIUM: 7.7 mg/dL — AB (ref 8.9–10.3)
CHLORIDE: 102 mmol/L (ref 101–111)
CO2: 21 mmol/L — ABNORMAL LOW (ref 22–32)
CREATININE: 1.2 mg/dL — AB (ref 0.44–1.00)
GFR calc non Af Amer: 41 mL/min — ABNORMAL LOW (ref >60)
GFR, EST AFRICAN AMERICAN: 48 mL/min — AB (ref >60)
Glucose, Bld: 127 mg/dL — ABNORMAL HIGH (ref 65–99)
Potassium: 3.6 mmol/L (ref 3.5–5.1)
SODIUM: 130 mmol/L — AB (ref 135–145)

## 2015-02-27 LAB — URINE CULTURE: SPECIAL REQUESTS: NORMAL

## 2015-02-27 MED ORDER — DOCUSATE SODIUM 100 MG PO CAPS
200.0000 mg | ORAL_CAPSULE | Freq: Two times a day (BID) | ORAL | Status: DC
  Administered 2015-02-27 – 2015-02-28 (×3): 200 mg via ORAL
  Filled 2015-02-27 (×4): qty 2

## 2015-02-27 MED ORDER — SENNA 8.6 MG PO TABS
1.0000 | ORAL_TABLET | Freq: Every day | ORAL | Status: DC
Start: 2015-02-27 — End: 2015-02-28
  Administered 2015-02-27 – 2015-02-28 (×2): 8.6 mg via ORAL
  Filled 2015-02-27 (×2): qty 1

## 2015-02-27 MED ORDER — SODIUM CHLORIDE 0.9 % IV SOLN
INTRAVENOUS | Status: DC
Start: 2015-02-27 — End: 2015-02-28
  Administered 2015-02-27 – 2015-02-28 (×2): via INTRAVENOUS

## 2015-02-27 NOTE — Progress Notes (Signed)
Pt and husband have been educated about the Brace. Pt states brace is too big, and is not happy with it. Will continue to monitor. Diane Hanel V

## 2015-02-27 NOTE — Progress Notes (Signed)
Orthopedic Tech Progress Note Patient Details:  [name] February 25, 1932 [id number]  Patient ID: [name], female   DOB: [birth year], 81 y.o.   MRN: [id number]   [name] 02/27/2015, 1:55 PMCalled [employer] for TLSO.

## 2015-02-27 NOTE — Progress Notes (Signed)
PT Cancellation Note  Patient Details Name: [name] MRN: [id number] DOB: [birth year]   Cancelled Treatment:    Reason Eval/Treat Not Completed: Patient not medically ready.  Spoke with RN who indicates they are still awaiting CT results of L hip and pelvis.  Will f/u another time.     Jiali Linney, Thornton Papas 02/27/2015, 11:19 AM

## 2015-02-27 NOTE — Progress Notes (Signed)
Orthopedic Tech Progress Note Patient Details:  [name] 1931-12-23 [id number] Brace order completed by [employer] vendor. Patient ID: [name], female   DOB: 10-Feb-1932, 81 y.o.   MRN: [id number]   [name] 02/27/2015, 4:05 PM

## 2015-02-27 NOTE — Progress Notes (Signed)
PT Cancellation Note  Patient Details Name: [name] MRN: [id number] DOB: January 10, 1932   Cancelled Treatment:    Reason Eval/Treat Not Completed: Patient not medically ready.  Pt with order for TLSO brace for T12 fx and brace has not been delivered yet.  Spoke with RN who notified Ortho Tech.  Will need to hold mobility and PT eval until brace is delivered.  Will continue to follow along.     Catarina Hartshorn, [place] 02/27/2015, 1:36 PM

## 2015-02-27 NOTE — Progress Notes (Signed)
Patient Demographics:    [name], is a 81 y.o. female, DOB - 1931/11/27, [id number]  Admit date - 02/26/2015   Admitting Physician Waldemar Dickens, MD  Outpatient Primary MD for the patient is Irven Shelling, MD  LOS -    Chief Complaint  Patient presents with  . Chest Pain        Subjective:    [name] today has, No headache, No chest pain, No abdominal pain - No Nausea, No new weakness tingling or numbness, No Cough - SOB. improved left hip pain.   Assessment  & Plan :     1. Left hip pain. Much improved, etiology unclear but appears musculoskeletal, discussed with neurosurgeon on call Dr. Cyndy Freeze who says that this likely is not coming from the T12 compression fracture. Pain is reproducible on pressing on her left hip, this is musculoskeletal, we'll initiate PT with pain control.   2. UTI. Continue empiric treatment and monitor cultures.   3. Incidental finding of T12 compression fracture. As above discussed with neurosurgery, no intervention needed, supportive care, TLSO brace will be applied.   4. Hyponatremia due to dehydration. Hold diuretic, hydrate and monitor.   5. Essential hypertension. Home dose Benicar, Coreg and Catapres continued.   6. Street of left renal stenosis. Status post stent placement 2012. No acute issue.   7. CK D stage III. Baseline creatinine close to 1.3, and a better than baseline.   8. GERD. On PPI continue.   9. DM type II. On diet control, here on sliding scale will be continued.  CBG (last 3)   Recent Labs  02/26/15 2100 02/27/15 0740 02/27/15 1124  GLUCAP 106* 125* 122*      Code Status : Full  Family Communication  : None present  Disposition Plan  : TBD  Consults  :  Dr Ditty neurosurgery over the  phone  Procedures  :   CT Pelvis and left hip.  DVT Prophylaxis  :  Lovenox    Lab Results  Component Value Date   PLT 183 02/26/2015    Inpatient Medications  Scheduled Meds: . aspirin  325 mg Oral QHS  . calcitonin (salmon)  1 spray Alternating Nares Daily  . carvedilol  25 mg Oral Once  . carvedilol  25 mg Oral BID WC  . cloNIDine  0.1 mg Oral Once  . cloNIDine  0.1 mg Oral BID  . dicyclomine  20 mg Oral BID  . docusate sodium  200 mg Oral BID  . enoxaparin (LOVENOX) injection  30 mg Subcutaneous Q24H  . insulin aspart  0-9 Units Subcutaneous TID WC  . loratadine  10 mg Oral Daily  . meclizine  25 mg Oral TID  . olmesartan  20 mg Oral Daily  . pantoprazole  40 mg Oral Daily  . senna  1 tablet Oral Daily  . simvastatin  20 mg Oral Q M,W,F   Continuous Infusions: . sodium chloride     PRN Meds:.cyclobenzaprine, diclofenac sodium, meperidine, nitroGLYCERIN, ondansetron **OR** ondansetron (ZOFRAN) IV, promethazine, zolpidem  Antibiotics  :    Anti-infectives    None        Objective:   Filed Vitals:   02/26/15 1608 02/26/15 2056 02/27/15 0120 02/27/15  0500  BP:  114/74 118/64 134/67  Pulse:  75  66  Temp: 97.7 F (36.5 C) 98.2 F (36.8 C)  98.1 F (36.7 C)  TempSrc: Oral Oral  Oral  Resp:  15 18 16   Height:      Weight:    51.574 kg (113 lb 11.2 oz)  SpO2:  97% 98% 97%    Wt Readings from Last 3 Encounters:  02/27/15 51.574 kg (113 lb 11.2 oz)  02/05/15 56.337 kg (124 lb 3.2 oz)  01/29/15 55.821 kg (123 lb 1 oz)     Intake/Output Summary (Last 24 hours) at 02/27/15 1201 Last data filed at 02/27/15 0742  Gross per 24 hour  Intake 1627.5 ml  Output   2100 ml  Net -472.5 ml     Physical Exam  Awake Alert, Oriented X 3, No new F.N deficits, Normal affect Dwight.AT,PERRAL Supple Neck,No JVD, No cervical lymphadenopathy appriciated.  Symmetrical Chest wall movement, Good air movement bilaterally, CTAB RRR,No Gallops,Rubs or new Murmurs, No  Parasternal Heave +ve B.Sounds, Abd Soft, No tenderness, No organomegaly appriciated, No rebound - guarding or rigidity. No Cyanosis, Clubbing or edema, No new Rash or bruise   Bilat SLR -ve    Data Review:   Micro Results Recent Results (from the past 240 hour(s))  Urine culture     Status: None (Preliminary result)   Collection Time: 02/26/15 12:26 PM  Result Value Ref Range Status   Specimen Description URINE, RANDOM  Final   Special Requests Normal  Final   Culture TOO YOUNG TO READ  Final   Report Status PENDING  Incomplete    Radiology Reports Ct Abdomen Pelvis Wo Contrast  01/30/2015  CLINICAL DATA:  Acute onset of left flank pain, vomiting and constipation. Initial encounter. EXAM: CT ABDOMEN AND PELVIS WITHOUT CONTRAST TECHNIQUE: Multidetector CT imaging of the abdomen and pelvis was performed following the standard protocol without IV contrast. COMPARISON:  CT of the abdomen pelvis from 08/20/2012, and renal ultrasound performed 12/29/2014 FINDINGS: The visualized lung bases are clear. Calcification is noted at the mitral and aortic valves. Scattered coronary artery calcifications are seen. The liver and spleen are unremarkable in appearance. The patient is status post cholecystectomy, with clips noted at the gallbladder fossa. The pancreas and adrenal glands are unremarkable. Small bilateral renal cysts are seen. Nonspecific perinephric stranding is noted bilaterally. Mild left renal atrophy is noted. There is no evidence of hydronephrosis. No renal or ureteral stones are identified. No free fluid is identified. The small bowel is unremarkable in appearance. A small duodenal diverticulum is noted at the pancreatic head. The stomach is within normal limits. No acute vascular abnormalities are seen. Scattered calcification is noted along the abdominal aorta and its branches. The appendix is not seen. There is no evidence for appendicitis. Scattered diverticulosis is noted along the  descending and sigmoid colon, without evidence of diverticulitis. The bladder is moderately distended and grossly unremarkable. The patient is status post hysterectomy. No suspicious adnexal masses are seen. No inguinal lymphadenopathy is seen. No acute osseous abnormalities are identified. Visualized right femoral hardware is grossly unremarkable in appearance. Multilevel vacuum phenomenon is noted along the lower thoracic and lumbar spine, with endplate sclerotic changes noted at multiple levels. IMPRESSION: 1. No acute abnormality seen to explain the patient's symptoms. 2. Mild left renal atrophy noted.  Small bilateral renal cysts seen. 3. Calcification at the mitral and aortic valves. Scattered coronary artery calcification noted. 4. Small duodenal diverticulum at the pancreatic  head. 5. Scattered calcification along the abdominal aorta and its branches. 6. Scattered diverticulosis along the descending and sigmoid colon, without evidence of diverticulitis. 7. Degenerative change along the lower thoracic and lumbar spine. Electronically Signed   By: Garald Balding M.D.   On: 01/30/2015 02:15   Dg Chest 2 View  02/26/2015  CLINICAL DATA:  Chest pain. Nausea and vomiting. Weakness. Neck pain. EXAM: CHEST  2 VIEW COMPARISON:  12/21/2011 and 12/12/2004 and CT scan of the abdomen dated 01/30/2015 FINDINGS: Heart size and pulmonary vascularity are normal and the lungs are clear. The patient has developed a compression fracture of the inferior aspect T12 since the prior CT scan 01/30/2015. Calcification in the thoracic aorta. IMPRESSION: New compression fracture of the inferior aspect of T12 since 01/30/2015. Heart and lungs appear normal. Aortic atherosclerosis. Electronically Signed   By: Lorriane Shire M.D.   On: 02/26/2015 07:56   Dg Pelvis 1-2 Views  02/06/2015  CLINICAL DATA:  Left lower quadrant pain EXAM: PELVIS - 1-2 VIEW COMPARISON:  01/30/2015 FINDINGS: There is no evidence of pelvic fracture or  diastasis. There is diffuse osteopenia. Metallic fixation rod and pin noted in proximal right femur. Degenerative changes lower lumbar spine. Moderate stool noted throughout the colon. IMPRESSION: No acute fracture or subluxation. Diffuse osteopenia. Postsurgical changes right femur. Degenerative changes lumbar spine. Electronically Signed   By: Lahoma Crocker M.D.   On: 02/06/2015 12:08   Dg Abd 1 View  02/06/2015  CLINICAL DATA:  Left lower quadrant pain. EXAM: ABDOMEN - 1 VIEW COMPARISON:  02/06/2015. FINDINGS: Retained oral contrast is seen in the colon. Scattered stool and gas in the colon as well. No small bowel dilatation. Surgical clips in the right upper quadrant. Coils project over the sacrum. Postoperative changes in the proximal right femur. Degenerative changes in the spine. IMPRESSION: No acute findings. Electronically Signed   By: Lorin Picket M.D.   On: 02/06/2015 12:15   Ct Pelvis Wo Contrast  02/27/2015  CLINICAL DATA:  History of prior admission October, 2016 for left flank pain. Recurrence of left flank pain at 6 a.m. this morning. No known injury. Subsequent encounter. EXAM: CT PELVIS WITHOUT CONTRAST TECHNIQUE: Multidetector CT imaging of the pelvis was performed following the standard protocol without intravenous contrast. COMPARISON:  CT abdomen and pelvis 01/30/2015 and 08/20/2012. FINDINGS: The patient is status post hysterectomy. There is some sigmoid diverticulosis without diverticulitis. Imaged bowel loops are otherwise normal in appearance. There is no lymphadenopathy or fluid. The urinary bladder appears normal. Aortoiliac atherosclerosis without aneurysm is seen. Coils are noted in the retroperitoneum anterior to the sacrum. Small focus of calcification in the anterior wall of the pelvis in a right paracentral position may be due to old trauma or surgery. No acute bony or joint abnormality is identified. The patient has a healed right intertrochanteric fracture with fixation  hardware in place. A small amount of heterotopic ossification about the fracture is unchanged. Degenerative disc disease and facet arthropathy are seen at L4-5 and L5-S1 IMPRESSION: No acute abnormality. Sigmoid diverticulosis without diverticulitis. Atherosclerosis. Lower lumbar spondylosis. Status post fixation of a remote healed right intertrochanteric fracture. Electronically Signed   By: Inge Rise M.D.   On: 02/27/2015 10:38   US Renal  02/04/2015  CLINICAL DATA:  Left flank pain for 1 day EXAM: RENAL / URINARY TRACT ULTRASOUND COMPLETE COMPARISON:  01/30/2015 FINDINGS: Right Kidney: Length: 10.4 cm. Mild cortical thinning is noted. An exophytic cyst is noted arising from the upper  pole of the right kidney measuring 1.6 cm. This is similar to changes on recent CT examination. Left Kidney: Length: 9.1 cm. Cortical thinning is noted. Multiple cysts are seen again similar to that noted on prior CT examination. Bladder: Appears normal for degree of bladder distention. IMPRESSION: Bilateral renal cysts.  No acute abnormality is noted. Electronically Signed   By: Inez Catalina M.D.   On: 02/04/2015 17:00   Ct Hip Left Wo Contrast  02/27/2015  CLINICAL DATA:  Left hip pain.  Fell several weeks ago. EXAM: CT OF THE LEFT HIP WITHOUT CONTRAST TECHNIQUE: Multidetector CT imaging of the left hip was performed according to the standard protocol. Multiplanar CT image reconstructions were also generated. COMPARISON:  CT pelvis, same date.  Prior CT scan 01/30/2015 FINDINGS: The left hip is normally located. Mild to moderate degenerative changes with joint space narrowing an osteophytic spurring. No fracture or avascular necrosis. The visualized left hemipelvis is intact. No definite fractures. The pubic symphysis and left SI joint are intact. No significant intrapelvic abnormalities are demonstrated. No inguinal mass or adenopathy. IMPRESSION: No acute bony findings. Mild to moderate left hip joint degenerative  changes. Electronically Signed   By: Marijo Sanes M.D.   On: 02/27/2015 11:24     CBC  Recent Labs Lab 02/26/15 0734  WBC 5.6  HGB 12.0  HCT 32.5*  PLT 183  MCV 79.9  MCH 29.5  MCHC 36.9*  RDW 12.5    Chemistries   Recent Labs Lab 02/26/15 0734 02/27/15 0358  NA 124* 130*  K 3.5 3.6  CL 93* 102  CO2 20* 21*  GLUCOSE 148* 127*  BUN 12 12  CREATININE 1.11* 1.20*  CALCIUM 8.6* 7.7*  AST 22  --   ALT 14  --   ALKPHOS 52  --   BILITOT 0.6  --    ------------------------------------------------------------------------------------------------------------------ estimated creatinine clearance is 27.7 mL/min (by C-G formula based on Cr of 1.2). ------------------------------------------------------------------------------------------------------------------ No results for input(s): HGBA1C in the last 72 hours. ------------------------------------------------------------------------------------------------------------------ No results for input(s): CHOL, HDL, LDLCALC, TRIG, CHOLHDL, LDLDIRECT in the last 72 hours. ------------------------------------------------------------------------------------------------------------------ No results for input(s): TSH, T4TOTAL, T3FREE, THYROIDAB in the last 72 hours.  Invalid input(s): FREET3 ------------------------------------------------------------------------------------------------------------------ No results for input(s): VITAMINB12, FOLATE, FERRITIN, TIBC, IRON, RETICCTPCT in the last 72 hours.  Coagulation profile No results for input(s): INR, PROTIME in the last 168 hours.  No results for input(s): DDIMER in the last 72 hours.  Cardiac Enzymes  Recent Labs Lab 02/26/15 0734  TROPONINI <0.03   ------------------------------------------------------------------------------------------------------------------ Invalid input(s): POCBNP   Time Spent in minutes  35   Nirvaan Frett K M.D on 02/27/2015 at 12:01  PM  Between 7am to 7pm - Pager - [phone]  After 7pm go to [email or web address] - password [facility]  [facility] -  Office  [phone]

## 2015-02-28 LAB — GLUCOSE, CAPILLARY: Glucose-Capillary: 117 mg/dL — ABNORMAL HIGH (ref 65–99)

## 2015-02-28 LAB — BASIC METABOLIC PANEL
ANION GAP: 8 (ref 5–15)
BUN: 9 mg/dL (ref 6–20)
CHLORIDE: 106 mmol/L (ref 101–111)
CO2: 22 mmol/L (ref 22–32)
Calcium: 8 mg/dL — ABNORMAL LOW (ref 8.9–10.3)
Creatinine, Ser: 1.01 mg/dL — ABNORMAL HIGH (ref 0.44–1.00)
GFR calc Af Amer: 59 mL/min — ABNORMAL LOW (ref >60)
GFR calc non Af Amer: 51 mL/min — ABNORMAL LOW (ref >60)
GLUCOSE: 108 mg/dL — AB (ref 65–99)
POTASSIUM: 3.6 mmol/L (ref 3.5–5.1)
SODIUM: 136 mmol/L (ref 135–145)

## 2015-02-28 MED ORDER — DOCUSATE SODIUM 100 MG PO CAPS: 200.0000 mg | ORAL_CAPSULE | Freq: Two times a day (BID) | ORAL | Status: DC

## 2015-02-28 MED ORDER — DICLOFENAC SODIUM 1 % TD GEL: 4.0000 g | Freq: Four times a day (QID) | TRANSDERMAL | Status: DC | PRN

## 2015-02-28 NOTE — Evaluation (Signed)
Physical Therapy Evaluation and discharge Patient Details Name: [name] MRN: [id number] DOB: [birth year] Today's Date: 02/28/2015   History of Present Illness  [name] is a 81 y.o. female with a Past Medical History of CAD, RA Stenosis, HTN, HLD, DM, GERD, osteoporosis, Meniere disease who presents with L flank pain, ??UTI, compression fractures, and hyponatremia.  Clinical Impression  Pt, a retired [employer] scientist, admitted with above diagnosis.Pt with TLSO brace and has been educated on its use and back precautions with handout.  Reviewed body mechanics with mobility.  Pt is steadier with RW and pt agreed to use RW for the time being.  Recommend HHPT for continued therapy services as pt is being d/c from hospital after PT session.  Pt has all DME needed and states she has other family that will be helping her into the house at time of d/c. Will d/c pt from PT services due to all education complete.  If pt ends up not being d/c please re-order PT services.     Follow Up Recommendations Home health PT    Equipment Recommendations  None recommended by PT    Recommendations for Other Services       Precautions / Restrictions Precautions Precautions: Fall;Back Precaution Booklet Issued: Yes (comment) Precaution Comments: reviewed with pt and husband Required Braces or Orthoses: Spinal Brace Spinal Brace: Thoracolumbosacral orthotic;Applied in sitting position      Mobility  Bed Mobility Overal bed mobility: Needs Assistance Bed Mobility: Rolling;Sidelying to Sit;Sit to Sidelying Rolling: Supervision Sidelying to sit: Supervision     Sit to sidelying: Supervision General bed mobility comments: S for body mechanics. Donned TLSO in sitting  Transfers Overall transfer level: Modified independent     Sit to Stand: Modified independent (Device/Increase time)            Ambulation/Gait Ambulation/Gait assistance: Min guard;Supervision Ambulation Distance (Feet): 150  Feet Assistive device: Rolling walker (2 wheeled) Gait Pattern/deviations: Decreased step length - left;Decreased step length - right;Staggering right;Staggering left Gait velocity: decreased   General Gait Details: AMb in room without AD with unsteadiness.  Gave RW for hallway ambulation and steadiness much improved  Stairs            Wheelchair Mobility    Modified Rankin (Stroke Patients Only)       Balance Overall balance assessment: Needs assistance   Sitting balance-Leahy Scale: Good       Standing balance-Leahy Scale: Fair Standing balance comment: pt steadier with RW                             Pertinent Vitals/Pain Pain Assessment: No/denies pain    Home Living Family/patient expects to be discharged to:: Private residence Living Arrangements: Spouse/significant other Available Help at Discharge: Family;Available 24 hours/day Type of Home: House Home Access: Stairs to enter Entrance Stairs-Rails: None Entrance Stairs-Number of Steps: 1 Home Layout: One level Home Equipment: None;Walker - 2 wheels;Cane - single point;Bedside commode;Shower seat      Prior Function Level of Independence: Independent         Comments: pt reports being independent without AD PTA (2 miles a day until about 1 month ago).  She reports no h/o falls, yet her PMHx in her chart have a wrist and hip fx in it in 2011 and 2012     Hand Dominance   Dominant Hand: Right    Extremity/Trunk Assessment   Upper Extremity Assessment: Overall WFL for  tasks assessed           Lower Extremity Assessment: Overall WFL for tasks assessed         Communication   Communication: No difficulties  Cognition Arousal/Alertness: Awake/alert Behavior During Therapy: WFL for tasks assessed/performed Overall Cognitive Status: Within Functional Limits for tasks assessed                      General Comments      Exercises        Assessment/Plan    PT  Assessment All further PT needs can be met in the next venue of care  PT Diagnosis Difficulty walking   PT Problem List Decreased strength;Decreased balance;Decreased mobility;Decreased knowledge of precautions  PT Treatment Interventions     PT Goals (Current goals can be found in the Care Plan section) Acute Rehab PT Goals Patient Stated Goal: go home after therapy PT Goal Formulation: All assessment and education complete, DC therapy    Frequency     Barriers to discharge        Co-evaluation               End of Session Equipment Utilized During Treatment: Back brace Activity Tolerance: Patient tolerated treatment well Patient left: in bed;with call bell/phone within reach;with family/visitor present Nurse Communication: Mobility status         Time: 5726-2035 PT Time Calculation (min) (ACUTE ONLY): 19 min   Charges:   PT Evaluation $Initial PT Evaluation Tier I: 1 Procedure     PT G Codes:        [name] 02/28/2015, 10:35 AM

## 2015-02-28 NOTE — Progress Notes (Signed)
Patient had brady episode, HR=39.  Patient was asymptomatic.  Dr. Candiss Norse notified.  He stated patient ok to still discharge.

## 2015-02-28 NOTE — Care Management Note (Signed)
Case Management Note  Patient Details  Name: NONIA WIETECHA MRN: [id number] Date of Birth: July 13, 1931  Subjective/Objective:  Pt admitted for weakness and fall. Plan for d/c home today.                   Action/Plan: Pt is agreeable to [facility] with [facility]. Referral made and SOC to begin within 24-48 hrs post d/c. No further needs from CM at this time.    Expected Discharge Date:                  Expected Discharge Plan:  [place]  In-House Referral:  NA  Discharge planning Services  CM Consult  Post Acute Care Choice:  Home Health Choice offered to:  Patient  DME Arranged:  N/A DME Agency:  NA  HH Arranged:  PT [place] Agency:  [facility]  Status of Service:  Completed, signed off  Medicare Important Message Given:    Date Medicare IM Given:    Medicare IM give by:    Date Additional Medicare IM Given:    Additional Medicare Important Message give by:     If discussed at [place] of Stay Meetings, dates discussed:    Additional Comments:  Bethena Roys, RN 02/28/2015, 11:32 AM

## 2015-02-28 NOTE — Discharge Instructions (Signed)
Follow with Primary MD Irven Shelling, MD in 2-3 days   Get CBC, CMP, 2 view Chest X ray checked  by Primary MD next visit.    Activity: Wear the brace provided when out of the bed, ambulate with Full fall precautions use walker/cane & assistance as needed   Disposition Home     Diet: Heart Healthy    For Heart failure patients - Check your Weight same time everyday, if you gain over 2 pounds, or you develop in leg swelling, experience more shortness of breath or chest pain, call your Primary MD immediately. Follow Cardiac Low Salt Diet and 1.5 lit/day fluid restriction.   On your next visit with your primary care physician please Get Medicines reviewed and adjusted.   Please request your Prim.MD to go over all Hospital Tests and Procedure/Radiological results at the follow up, please get all Hospital records sent to your Prim MD by signing hospital release before you go home.   If you experience worsening of your admission symptoms, develop shortness of breath, life threatening emergency, suicidal or homicidal thoughts you must seek medical attention immediately by calling 911 or calling your MD immediately  if symptoms less severe.  You Must read complete instructions/literature along with all the possible adverse reactions/side effects for all the Medicines you take and that have been prescribed to you. Take any new Medicines after you have completely understood and accpet all the possible adverse reactions/side effects.   Do not drive, operating heavy machinery, perform activities at heights, swimming or participation in water activities or provide baby sitting services if your were admitted for syncope or siezures until you have seen by Primary MD or a Neurologist and advised to do so again.  Do not drive when taking Pain medications.    Do not take more than prescribed Pain, Sleep and Anxiety Medications  Special Instructions: If you have smoked or chewed Tobacco  in the  last 2 yrs please stop smoking, stop any regular Alcohol  and or any Recreational drug use.  Wear Seat belts while driving.   Please note  You were cared for by a hospitalist during your hospital stay. If you have any questions about your discharge medications or the care you received while you were in the hospital after you are discharged, you can call the unit and asked to speak with the hospitalist on call if the hospitalist that took care of you is not available. Once you are discharged, your primary care physician will handle any further medical issues. Please note that NO REFILLS for any discharge medications will be authorized once you are discharged, as it is imperative that you return to your primary care physician (or establish a relationship with a primary care physician if you do not have one) for your aftercare needs so that they can reassess your need for medications and monitor your lab values.  Diabetes Mellitus and Food It is important for you to manage your blood sugar (glucose) level. Your blood glucose level can be greatly affected by what you eat. Eating healthier foods in the appropriate amounts throughout the day at about the same time each day will help you control your blood glucose level. It can also help slow or prevent worsening of your diabetes mellitus. Healthy eating may even help you improve the level of your blood pressure and reach or maintain a healthy weight.  General recommendations for healthful eating and cooking habits include:  Eating meals and snacks regularly. Avoid going long  periods of time without eating to lose weight.  Eating a diet that consists mainly of plant-based foods, such as fruits, vegetables, nuts, legumes, and whole grains.  Using low-heat cooking methods, such as baking, instead of high-heat cooking methods, such as deep frying. Work with your dietitian to make sure you understand how to use the Nutrition Facts information on food  labels. HOW CAN FOOD AFFECT ME? Carbohydrates Carbohydrates affect your blood glucose level more than any other type of food. Your dietitian will help you determine how many carbohydrates to eat at each meal and teach you how to count carbohydrates. Counting carbohydrates is important to keep your blood glucose at a healthy level, especially if you are using insulin or taking certain medicines for diabetes mellitus. Alcohol Alcohol can cause sudden decreases in blood glucose (hypoglycemia), especially if you use insulin or take certain medicines for diabetes mellitus. Hypoglycemia can be a life-threatening condition. Symptoms of hypoglycemia (sleepiness, dizziness, and disorientation) are similar to symptoms of having too much alcohol.  If your health care provider has given you approval to drink alcohol, do so in moderation and use the following guidelines:  Women should not have more than one drink per day, and men should not have more than two drinks per day. One drink is equal to:  12 oz of beer.  5 oz of wine.  1 oz of hard liquor.  Do not drink on an empty stomach.  Keep yourself hydrated. Have water, diet soda, or unsweetened iced tea.  Regular soda, juice, and other mixers might contain a lot of carbohydrates and should be counted. WHAT FOODS ARE NOT RECOMMENDED? As you make food choices, it is important to remember that all foods are not the same. Some foods have fewer nutrients per serving than other foods, even though they might have the same number of calories or carbohydrates. It is difficult to get your body what it needs when you eat foods with fewer nutrients. Examples of foods that you should avoid that are high in calories and carbohydrates but low in nutrients include:  Trans fats (most processed foods list trans fats on the Nutrition Facts label).  Regular soda.  Juice.  Candy.  Sweets, such as cake, pie, doughnuts, and cookies.  Fried foods. WHAT FOODS CAN I  EAT? Eat nutrient-rich foods, which will nourish your body and keep you healthy. The food you should eat also will depend on several factors, including:  The calories you need.  The medicines you take.  Your weight.  Your blood glucose level.  Your blood pressure level.  Your cholesterol level. You should eat a variety of foods, including:  Protein.  Lean cuts of meat.  Proteins low in saturated fats, such as fish, egg whites, and beans. Avoid processed meats.  Fruits and vegetables.  Fruits and vegetables that may help control blood glucose levels, such as apples, mangoes, and yams.  Dairy products.  Choose fat-free or low-fat dairy products, such as milk, yogurt, and cheese.  Grains, bread, pasta, and rice.  Choose whole grain products, such as multigrain bread, whole oats, and brown rice. These foods may help control blood pressure.  Fats.  Foods containing healthful fats, such as nuts, avocado, olive oil, canola oil, and fish. DOES EVERYONE WITH DIABETES MELLITUS HAVE THE SAME MEAL PLAN? Because every person with diabetes mellitus is different, there is not one meal plan that works for everyone. It is very important that you meet with a dietitian who will help you  create a meal plan that is just right for you.   This information is not intended to replace advice given to you by your health care provider. Make sure you discuss any questions you have with your health care provider.   Document Released: 12/26/2004 Document Revised: 04/21/2014 Document Reviewed: 02/25/2013 Elsevier Interactive Patient Education 2016 Else[employer]  Spinal Compression Fracture A spinal compression fracture is a collapse of the bones that form the spine (vertebrae). With this type of fracture, the vertebrae become squashed (compressed) into a wedge shape. Most compression fractures happen in the middle or lower part of the spine. CAUSES This condition may be caused by:  Thinning and  loss of density in the bones (osteoporosis). This is the most common cause.  A fall.  A car or motorcycle accident.  Cancer.  Trauma, such as a heavy, direct hit to the head. RISK FACTORS You may be at greater risk for a spinal compression fracture if you:  Are 28 years old or older.  Have osteoporosis.  Have certain types of cancer, including:  Multiple myeloma.  Lymphoma.  Prostate cancer.  Lung cancer.  Breast cancer. SYMPTOMS Symptoms of this condition include:  Severe pain.  Pain that gets worse over time.  Pain that is worse when you stand, walk, sit, or bend.  Sudden pain that is so bad that it is hard for you to move.  Bending or humping of the spine.  Gradual loss of height.  Numbness, tingling, or weakness in the back and legs.  Trouble walking. Your symptoms will depend on the cause of the fracture and how quickly it develops. For example, fractures that are caused by osteoporosis can cause few symptoms, no symptoms, or symptoms that develop slowly over time. DIAGNOSIS This condition may be diagnosed based on symptoms, medical history, and a physical exam. During the physical exam, your health care provider may tap along the length of your spine to check for tenderness. Tests may be done to confirm the diagnosis. They may include:  A bone density test to check for osteoporosis.  Imaging tests, such as a spine X-ray, a CT scan, or MRI. TREATMENT Treatment for this condition depends on the cause and severity of the condition.Some fractures, such as those that are caused by osteoporosis, may heal on their own with supportive care. This may include:  Pain medicine.  Rest.  A back brace.  Physical therapy exercises.  Medicine that reduces bone pain.  Calcium and vitamin D supplements. Fractures that cause the back to become misshapen, cause nerve pain or weakness, or do not respond to other treatment may be treated with a surgical procedure,  such as:  Vertebroplasty. In this procedure, bone cement is injected into the collapsed vertebrae to stabilize them.  Balloon kyphoplasty. In this procedure, the collapsed vertebrae are expanded with a balloon and then bone cement is injected into them.  Spinal fusion. In this procedure, the collapsed vertebrae are connected (fused) to normal vertebrae. HOME CARE INSTRUCTIONS General Instructions  Take medicines only as directed by your health care provider.  Do not drive or operate heavy machinery while taking pain medicine.  If directed, apply ice to the injured area:  Put ice in a plastic bag.  Place a towel between your skin and the bag.  Leave the ice on for 30 minutes every two hours at first. Then apply the ice as needed.  Wear your neck brace or back brace as directed by your health care provider.  Do not drink alcohol. Alcohol can interfere with your treatment.  Keep all follow-up visits as directed by your health care provider. This is important. It can help to prevent permanent injury, disability, and long-lasting (chronic) pain. Activity  Stay in bed (on bed rest) only as directed by your health care provider. Being on bed rest for too long can make your condition worse.  Return to your normal activities as directed by your health care provider. Ask what activities are safe for you.  Do exercises to improve motion and strength in your back (physical therapy), as recommended by your health care provider.  Exercise regularly as directed by your health care provider. SEEK MEDICAL CARE IF:  You have a fever.  You develop a cough that makes your pain worse.  Your pain medicine is not helping.  Your pain does not get better over time.  You cannot return to your normal activities as planned or expected. SEEK IMMEDIATE MEDICAL CARE IF:  Your pain is very bad and it suddenly gets worse.  You are unable to move any body part (paralysis) that is below the level of  your injury.  You have numbness, tingling, or weakness in any body part that is below the level of your injury.  You cannot control your bladder or bowels.   This information is not intended to replace advice given to you by your health care provider. Make sure you discuss any questions you have with your health care provider.   Document Released: 03/31/2005 Document Revised: 08/15/2014 Document Reviewed: 04/04/2014 Elsevier Interactive Patient Education 2016 Elsevier Inc.   Thoracolumbosacral Orthosis Brace A thoracolumbosacral orthosis (TLSO) brace can be worn for different purposes. A TLSO brace can be worn to support the back. Your back may need more support if you had a broken bone in your back (fractured vertebrae), back surgery, or you cannot move (paralysis) your torso muscles. A TLSO brace can also be worn by a child to help prevent curving back problems from getting worse as the child grows. TLSO braces are used to limit bending and twisting. The braces are made from a hard plastic. They are custom fit for each wearer. The TLSO brace covers both the front and back of the torso. On the back, the brace reaches from just above the tailbone to just below the shoulder blades. On the front, the brace covers the ribs and often reaches past the waist and over the hips. The brace can be worn under clothing.  HOME CARE INSTRUCTIONS  Ask your caregiver if you can remove your brace when showering or sleeping.  Follow your caregiver's instructions for putting on your brace.  Follow your caregiver's instructions about how much weight you can lift.  Always wear a T-shirt under the brace.  Make sure the brace is always well-aligned and fits you closely.  Check your skin daily for sores and redness caused by rubbing or pressure.  If you feel unsteady, use a cane or walker for support until you feel more steady.  Sit in high, firm chairs. It may be difficult to stand up from low, soft  chairs.  Keep all follow-up appointments as directed by your caregiver. SEEK IMMEDIATE MEDICAL CARE IF:  You have a fever.  You have shortness of breath.  You have chest pain.  You have numbness, tingling, or pain. Developed in conjunction with the [employer] at [facility].   This information is not intended to replace advice given to  you by your health care provider. Make sure you discuss any questions you have with your health care provider.   Document Released: 03/20/2011 Document Revised: 06/23/2011 Document Reviewed: 03/20/2011 Elsevier Interactive Patient Education 2016 [employer].   Diabetes Mellitus and Food It is important for you to manage your blood sugar (glucose) level. Your blood glucose level can be greatly affected by what you eat. Eating healthier foods in the appropriate amounts throughout the day at about the same time each day will help you control your blood glucose level. It can also help slow or prevent worsening of your diabetes mellitus. Healthy eating may even help you improve the level of your blood pressure and reach or maintain a healthy weight.  General recommendations for healthful eating and cooking habits include:  Eating meals and snacks regularly. Avoid going long periods of time without eating to lose weight.  Eating a diet that consists mainly of plant-based foods, such as fruits, vegetables, nuts, legumes, and whole grains.  Using low-heat cooking methods, such as baking, instead of high-heat cooking methods, such as deep frying. Work with your dietitian to make sure you understand how to use the Nutrition Facts information on food labels. HOW CAN FOOD AFFECT ME? Carbohydrates Carbohydrates affect your blood glucose level more than any other type of food. Your dietitian will help you determine how many carbohydrates to eat at each meal and teach you how to count carbohydrates. Counting  carbohydrates is important to keep your blood glucose at a healthy level, especially if you are using insulin or taking certain medicines for diabetes mellitus. Alcohol Alcohol can cause sudden decreases in blood glucose (hypoglycemia), especially if you use insulin or take certain medicines for diabetes mellitus. Hypoglycemia can be a life-threatening condition. Symptoms of hypoglycemia (sleepiness, dizziness, and disorientation) are similar to symptoms of having too much alcohol.  If your health care provider has given you approval to drink alcohol, do so in moderation and use the following guidelines:  Women should not have more than one drink per day, and men should not have more than two drinks per day. One drink is equal to:  12 oz of beer.  5 oz of wine.  1 oz of hard liquor.  Do not drink on an empty stomach.  Keep yourself hydrated. Have water, diet soda, or unsweetened iced tea.  Regular soda, juice, and other mixers might contain a lot of carbohydrates and should be counted. WHAT FOODS ARE NOT RECOMMENDED? As you make food choices, it is important to remember that all foods are not the same. Some foods have fewer nutrients per serving than other foods, even though they might have the same number of calories or carbohydrates. It is difficult to get your body what it needs when you eat foods with fewer nutrients. Examples of foods that you should avoid that are high in calories and carbohydrates but low in nutrients include:  Trans fats (most processed foods list trans fats on the Nutrition Facts label).  Regular soda.  Juice.  Candy.  Sweets, such as cake, pie, doughnuts, and cookies.  Fried foods. WHAT FOODS CAN I EAT? Eat nutrient-rich foods, which will nourish your body and keep you healthy. The food you should eat also will depend on several factors, including:  The calories you need.  The medicines you take.  Your weight.  Your blood glucose level.  Your  blood pressure level.  Your cholesterol level. You should eat a variety of foods, including:  Protein.  Lean cuts of meat.  Proteins low in saturated fats, such as fish, egg whites, and beans. Avoid processed meats.  Fruits and vegetables.  Fruits and vegetables that may help control blood glucose levels, such as apples, mangoes, and yams.  Dairy products.  Choose fat-free or low-fat dairy products, such as milk, yogurt, and cheese.  Grains, bread, pasta, and rice.  Choose whole grain products, such as multigrain bread, whole oats, and brown rice. These foods may help control blood pressure.  Fats.  Foods containing healthful fats, such as nuts, avocado, olive oil, canola oil, and fish. DOES EVERYONE WITH DIABETES MELLITUS HAVE THE SAME MEAL PLAN? Because every person with diabetes mellitus is different, there is not one meal plan that works for everyone. It is very important that you meet with a dietitian who will help you create a meal plan that is just right for you.   This information is not intended to replace advice given to you by your health care provider. Make sure you discuss any questions you have with your health care provider.   Document Released: 12/26/2004 Document Revised: 04/21/2014 Document Reviewed: 02/25/2013 Elsevier Interactive Patient Education [employer].

## 2015-02-28 NOTE — Progress Notes (Signed)
Discharge instructions reviewed with patient and patient's husband.  Patient stated she wanted to schedule appointments.

## 2015-02-28 NOTE — Discharge Summary (Signed)
[name], is a 81 y.o. female  DOB 04/14/1932  MRN [id number].  Admission date:  02/26/2015  Admitting Physician  Waldemar Dickens, MD  Discharge Date:  02/28/2015   Primary MD  Irven Shelling, MD  Recommendations for primary care physician for things to follow:   Monitor left hip pain closely, appears to be musculoskeletal. Outpatient follow-up with neurosurgeon Dr. Hortencia Pilar orthopedic surgeon Dr. Berenice Primas.   Admission Diagnosis  Hyponatremia [E87.1] Nausea [R11.0] Generalized weakness [R53.1]   Discharge Diagnosis  Hyponatremia [E87.1] Nausea [R11.0] Generalized weakness [R53.1]    Active Problems:   Renal artery stenosis (HCC)   DM (diabetes mellitus) (HCC)   HTN (hypertension)   GERD (gastroesophageal reflux disease)   Left flank pain   Hyponatremia   Weakness   Compression fracture of T12 vertebra (HCC)   Chronic kidney disease   Multiple allergies      Past Medical History  Diagnosis Date  . CAD (coronary artery disease)   . Renal artery stenosis ([place])   . HTN (hypertension)   . Dyslipidemia   . GERD (gastroesophageal reflux disease)   . Osteoporosis   . Meniere disease     "for 20 years" takes Antivert 3 times per day  . Complication of anesthesia     "almost died when I had my breast reduction"  . Heart murmur   . Myocardial infarction ([place]) 2007  . Type II diabetes mellitus ([place])     "controlled w/diet and exercise only" (02/26/2015)  . History of hiatal hernia   . Seizures [facility])     Past Surgical History  Procedure Laterality Date  . Hip fracture surgery Right 2011    "added rod in my leg and a ball"  . Wrist fracture surgery Left 2012  . Cholecystectomy    . Bladder suspension  x3  . Anterior cervical decomp/discectomy fusion  02/2002    Archie Endo 08/26/2010  .  Reduction mammaplasty Bilateral 1985  . Renal artery stent Left 2012  . Peripheral vascular catheterization Left 12/20/2014    Procedure: Renal Angiography;  Surgeon: Wellington Hampshire, MD;  Location: [facility];  Service: Cardiovascular;  Laterality: Left;  . Vaginal hysterectomy  ~ 1995  . Fracture surgery    . Back surgery    . Breast lumpectomy  11/2007    breast needle-localized lumpectomy/notes 08/12/2012  . Neck hardware removal  11/2002    Archie Endo 08/26/2010  . Cataract extraction w/ intraocular lens  implant, bilateral Bilateral   . Cardiac catheterization  2007    "couldn't get stents in; I almost died'       HPI  from the history and physical done on the day of admission:    [name] is a 81 y.o. female with multiple medical problems not limited to diabetes, coronary artery disease, hypertension and renal artery stenosis. Patient was admitted to the hospital late October with left flank pain / hyponatremia. HCTZ stopped, Na+ improved. Left flank pain workup negative, felt to be musculoskeletal  in nature.  Patient returns to the emergency department this morning after waking up around 6 AM feeling "like I was having a heart attack". She had terrible left flank pain but also endorses chest pain. Chest pain nonradiating, described as burning sensation. No shortness of breath. Patient takes twice daily PPI. Currently no chest pain. According to patient's husband, left flank pain has persisted since hospital discharge in October. Pain is worse with standing and physical activity  Patient also endorses intermittent nausea with one episode of vomiting in the last 2 weeks. She also had to 3 days of loose stool but none in the last 3 days.Drinking a lot of water at home. Reports 12 pound weight loss in 2 weeks.   She currently on antibiotics for UTI. PCP called husband this morning, said repeat urine shows that a"stronger antibiotic will be needed"      Hospital Course:       1. Left hip pain. Much improved, etiology unclear but appears musculoskeletal, discussed with neurosurgeon on call Dr. Cyndy Freeze who says that this likely is not coming from the T12 compression fracture. Pain is reproducible on pressing on her left hip, this is musculoskeletal, we checked CT scan of left pelvis and hip which were unremarkable as well. She feels much better this morning, her pain is almost completely resolved, she's been using wall tangentially here which she will continue to do. She will be provided with a TLSO brace and discharged with home PT, she will follow with neurosurgery along with her primary orthopedic surgeon Dr. Berenice Primas upon discharge. Question if she has pyriformis per situs. We'll defer to orthopedic surgeon.   2. UTI. UA was unremarkable this admission.   3. Incidental finding of T12 compression fracture. As above discussed with neurosurgery, no intervention needed, supportive care, TLSO brace will be applied. Follow with neurosurgeon outpatient.   4. Hyponatremia due to dehydration. Resolved with hydration.   5. Essential hypertension. Home dose Benicar, Coreg and Catapres continued.   6. History of left renal stenosis. Status post stent placement 2012. No acute issue.   7. CK D stage III. Baseline creatinine close to 1.3, presently better than baseline.   8. GERD. On PPI continue.   9. DM type II. On diet control, continue.   Discharge Condition:  Stable  Follow UP  Follow-up Information    Follow up with Irven Shelling, MD. Schedule an appointment as soon as possible for a visit in 2 days.   Specialty:  Internal Medicine   Contact information:   [address] [place] [address] [phone]       Follow up with Kevan Ny Ditty, MD. Schedule an appointment as soon as possible for a visit in 1 week.   Specialty:  Neurosurgery   Why:  T12 fracture   Contact information:   [address] [place]  [address] [phone]       Follow up with GRAVES,JOHN L, MD. Schedule an appointment as soon as possible for a visit in 1 week.   Specialty:  Orthopedic Surgery   Why:  L Hip Pain   Contact information:   [place] [address] [phone]        Consults obtained - N Surg over the phone  Diet and Activity recommendation: See Discharge Instructions below  Discharge Instructions       Discharge Instructions    Diet - low sodium heart healthy    Complete by:  As directed      Discharge instructions    Complete by:  As directed   Follow with Primary MD Irven Shelling, MD in 2-3 days   Get CBC, CMP, 2 view Chest X ray checked  by Primary MD next visit.    Activity: Wear the brace provided when out of the bed, ambulate with Full fall precautions use walker/cane & assistance as needed   Disposition Home     Diet: Heart Healthy    For Heart failure patients - Check your Weight same time everyday, if you gain over 2 pounds, or you develop in leg swelling, experience more shortness of breath or chest pain, call your Primary MD immediately. Follow Cardiac Low Salt Diet and 1.5 lit/day fluid restriction.   On your next visit with your primary care physician please Get Medicines reviewed and adjusted.   Please request your Prim.MD to go over all Hospital Tests and Procedure/Radiological results at the follow up, please get all Hospital records sent to your Prim MD by signing hospital release before you go home.   If you experience worsening of your admission symptoms, develop shortness of breath, life threatening emergency, suicidal or homicidal thoughts you must seek medical attention immediately by calling 911 or calling your MD immediately  if symptoms less severe.  You Must read complete instructions/literature along with all the possible adverse reactions/side effects for all the Medicines you take and that have been prescribed to you. Take any new Medicines  after you have completely understood and accpet all the possible adverse reactions/side effects.   Do not drive, operating heavy machinery, perform activities at heights, swimming or participation in water activities or provide baby sitting services if your were admitted for syncope or siezures until you have seen by Primary MD or a Neurologist and advised to do so again.  Do not drive when taking Pain medications.    Do not take more than prescribed Pain, Sleep and Anxiety Medications  Special Instructions: If you have smoked or chewed Tobacco  in the last 2 yrs please stop smoking, stop any regular Alcohol  and or any Recreational drug use.  Wear Seat belts while driving.   Please note  You were cared for by a hospitalist during your hospital stay. If you have any questions about your discharge medications or the care you received while you were in the hospital after you are discharged, you can call the unit and asked to speak with the hospitalist on call if the hospitalist that took care of you is not available. Once you are discharged, your primary care physician will handle any further medical issues. Please note that NO REFILLS for any discharge medications will be authorized once you are discharged, as it is imperative that you return to your primary care physician (or establish a relationship with a primary care physician if you do not have one) for your aftercare needs so that they can reassess your need for medications and monitor your lab values.     Increase activity slowly    Complete by:  As directed              Discharge Medications       Medication List    TAKE these medications        ACCU-CHEK COMPACT PLUS test strip  Generic drug:  glucose blood  1 each by Other route See admin instructions. Check blood sugar at bedtime     ACCU-CHEK SOFTCLIX LANCETS lancets  1  each by Other route See admin instructions. Check blood sugar at bedtime     aspirin 325 MG  tablet  Take 325 mg by mouth at bedtime.     betamethasone dipropionate 0.05 % cream  Commonly known as:  DIPROLENE  Apply 1 application topically daily as needed (irritated skin).     carvedilol 25 MG tablet  Commonly known as:  COREG  Take 25 mg by mouth 2 (two) times daily with a meal.     cholecalciferol 1000 UNITS tablet  Commonly known as:  VITAMIN D  Take 2,000 Units by mouth daily.     CITRACAL PO  Take 1,200 mg by mouth daily.     cloNIDine 0.1 MG tablet  Commonly known as:  CATAPRES  Take 1 tablet (0.1 mg total) by mouth 2 (two) times daily.     Cod Liver Oil Caps  Take 1 capsule by mouth daily.     cyclobenzaprine 5 MG tablet  Commonly known as:  FLEXERIL  Take 1 tablet (5 mg total) by mouth 3 (three) times daily as needed for muscle spasms.     diclofenac sodium 1 % Gel  Commonly known as:  VOLTAREN  Apply 4 g topically 4 (four) times daily as needed (pain).     dicyclomine 20 MG tablet  Commonly known as:  BENTYL  Take 1 tablet (20 mg total) by mouth 2 (two) times daily.     docusate sodium 100 MG capsule  Commonly known as:  COLACE  Take 2 capsules (200 mg total) by mouth 2 (two) times daily.     esomeprazole 40 MG capsule  Commonly known as:  NEXIUM  Take 40 mg by mouth 2 (two) times daily.     fexofenadine 180 MG tablet  Commonly known as:  ALLEGRA  Take 180 mg by mouth daily.     meclizine 25 MG tablet  Commonly known as:  ANTIVERT  Take 25 mg by mouth 3 (three) times daily.     nitroGLYCERIN 0.4 MG SL tablet  Commonly known as:  NITROSTAT  Place 0.4 mg under the tongue every 5 (five) minutes as needed for chest pain (3 doses MAX).     olmesartan 40 MG tablet  Commonly known as:  BENICAR  Take 20 mg by mouth daily.     PREMARIN vaginal cream  Generic drug:  conjugated estrogens  Place 1-2 g vaginally 2 (two) times a week.     PRESERVISION AREDS Caps  Take 1 capsule by mouth 2 (two) times daily.     promethazine 25 MG tablet   Commonly known as:  PHENERGAN  Take 0.5 tablets (12.5 mg total) by mouth every 6 (six) hours as needed for nausea or vomiting.     simvastatin 20 MG tablet  Commonly known as:  ZOCOR  Take 20 mg by mouth every Monday, Wednesday, and Friday.     VITAMIN E PO  Take 1 capsule by mouth daily.        Major procedures and Radiology Reports - PLEASE review detailed and final reports for all details, in brief -    Ct Abdomen Pelvis Wo Contrast  01/30/2015  CLINICAL DATA:  Acute onset of left flank pain, vomiting and constipation. Initial encounter. EXAM: CT ABDOMEN AND PELVIS WITHOUT CONTRAST TECHNIQUE: Multidetector CT imaging of the abdomen and pelvis was performed following the standard protocol without IV contrast. COMPARISON:  CT of the abdomen pelvis from 08/20/2012, and renal ultrasound performed 12/29/2014 FINDINGS: The  visualized lung bases are clear. Calcification is noted at the mitral and aortic valves. Scattered coronary artery calcifications are seen. The liver and spleen are unremarkable in appearance. The patient is status post cholecystectomy, with clips noted at the gallbladder fossa. The pancreas and adrenal glands are unremarkable. Small bilateral renal cysts are seen. Nonspecific perinephric stranding is noted bilaterally. Mild left renal atrophy is noted. There is no evidence of hydronephrosis. No renal or ureteral stones are identified. No free fluid is identified. The small bowel is unremarkable in appearance. A small duodenal diverticulum is noted at the pancreatic head. The stomach is within normal limits. No acute vascular abnormalities are seen. Scattered calcification is noted along the abdominal aorta and its branches. The appendix is not seen. There is no evidence for appendicitis. Scattered diverticulosis is noted along the descending and sigmoid colon, without evidence of diverticulitis. The bladder is moderately distended and grossly unremarkable. The patient is status  post hysterectomy. No suspicious adnexal masses are seen. No inguinal lymphadenopathy is seen. No acute osseous abnormalities are identified. Visualized right femoral hardware is grossly unremarkable in appearance. Multilevel vacuum phenomenon is noted along the lower thoracic and lumbar spine, with endplate sclerotic changes noted at multiple levels. IMPRESSION: 1. No acute abnormality seen to explain the patient's symptoms. 2. Mild left renal atrophy noted.  Small bilateral renal cysts seen. 3. Calcification at the mitral and aortic valves. Scattered coronary artery calcification noted. 4. Small duodenal diverticulum at the pancreatic head. 5. Scattered calcification along the abdominal aorta and its branches. 6. Scattered diverticulosis along the descending and sigmoid colon, without evidence of diverticulitis. 7. Degenerative change along the lower thoracic and lumbar spine. Electronically Signed   By: Garald Balding M.D.   On: 01/30/2015 02:15   Dg Chest 2 View  02/26/2015  CLINICAL DATA:  Chest pain. Nausea and vomiting. Weakness. Neck pain. EXAM: CHEST  2 VIEW COMPARISON:  12/21/2011 and 12/12/2004 and CT scan of the abdomen dated 01/30/2015 FINDINGS: Heart size and pulmonary vascularity are normal and the lungs are clear. The patient has developed a compression fracture of the inferior aspect T12 since the prior CT scan 01/30/2015. Calcification in the thoracic aorta. IMPRESSION: New compression fracture of the inferior aspect of T12 since 01/30/2015. Heart and lungs appear normal. Aortic atherosclerosis. Electronically Signed   By: Lorriane Shire M.D.   On: 02/26/2015 07:56   Dg Pelvis 1-2 Views  02/06/2015  CLINICAL DATA:  Left lower quadrant pain EXAM: PELVIS - 1-2 VIEW COMPARISON:  01/30/2015 FINDINGS: There is no evidence of pelvic fracture or diastasis. There is diffuse osteopenia. Metallic fixation rod and pin noted in proximal right femur. Degenerative changes lower lumbar spine. Moderate  stool noted throughout the colon. IMPRESSION: No acute fracture or subluxation. Diffuse osteopenia. Postsurgical changes right femur. Degenerative changes lumbar spine. Electronically Signed   By: Lahoma Crocker M.D.   On: 02/06/2015 12:08   Dg Abd 1 View  02/06/2015  CLINICAL DATA:  Left lower quadrant pain. EXAM: ABDOMEN - 1 VIEW COMPARISON:  02/06/2015. FINDINGS: Retained oral contrast is seen in the colon. Scattered stool and gas in the colon as well. No small bowel dilatation. Surgical clips in the right upper quadrant. Coils project over the sacrum. Postoperative changes in the proximal right femur. Degenerative changes in the spine. IMPRESSION: No acute findings. Electronically Signed   By: Lorin Picket M.D.   On: 02/06/2015 12:15   Ct Pelvis Wo Contrast  02/27/2015  CLINICAL DATA:  History of  prior admission October, 2016 for left flank pain. Recurrence of left flank pain at 6 a.m. this morning. No known injury. Subsequent encounter. EXAM: CT PELVIS WITHOUT CONTRAST TECHNIQUE: Multidetector CT imaging of the pelvis was performed following the standard protocol without intravenous contrast. COMPARISON:  CT abdomen and pelvis 01/30/2015 and 08/20/2012. FINDINGS: The patient is status post hysterectomy. There is some sigmoid diverticulosis without diverticulitis. Imaged bowel loops are otherwise normal in appearance. There is no lymphadenopathy or fluid. The urinary bladder appears normal. Aortoiliac atherosclerosis without aneurysm is seen. Coils are noted in the retroperitoneum anterior to the sacrum. Small focus of calcification in the anterior wall of the pelvis in a right paracentral position may be due to old trauma or surgery. No acute bony or joint abnormality is identified. The patient has a healed right intertrochanteric fracture with fixation hardware in place. A small amount of heterotopic ossification about the fracture is unchanged. Degenerative disc disease and facet arthropathy are seen  at L4-5 and L5-S1 IMPRESSION: No acute abnormality. Sigmoid diverticulosis without diverticulitis. Atherosclerosis. Lower lumbar spondylosis. Status post fixation of a remote healed right intertrochanteric fracture. Electronically Signed   By: Inge Rise M.D.   On: 02/27/2015 10:38   US Renal  02/04/2015  CLINICAL DATA:  Left flank pain for 1 day EXAM: RENAL / URINARY TRACT ULTRASOUND COMPLETE COMPARISON:  01/30/2015 FINDINGS: Right Kidney: Length: 10.4 cm. Mild cortical thinning is noted. An exophytic cyst is noted arising from the upper pole of the right kidney measuring 1.6 cm. This is similar to changes on recent CT examination. Left Kidney: Length: 9.1 cm. Cortical thinning is noted. Multiple cysts are seen again similar to that noted on prior CT examination. Bladder: Appears normal for degree of bladder distention. IMPRESSION: Bilateral renal cysts.  No acute abnormality is noted. Electronically Signed   By: Inez Catalina M.D.   On: 02/04/2015 17:00   Ct Hip Left Wo Contrast  02/27/2015  CLINICAL DATA:  Left hip pain.  Fell several weeks ago. EXAM: CT OF THE LEFT HIP WITHOUT CONTRAST TECHNIQUE: Multidetector CT imaging of the left hip was performed according to the standard protocol. Multiplanar CT image reconstructions were also generated. COMPARISON:  CT pelvis, same date.  Prior CT scan 01/30/2015 FINDINGS: The left hip is normally located. Mild to moderate degenerative changes with joint space narrowing an osteophytic spurring. No fracture or avascular necrosis. The visualized left hemipelvis is intact. No definite fractures. The pubic symphysis and left SI joint are intact. No significant intrapelvic abnormalities are demonstrated. No inguinal mass or adenopathy. IMPRESSION: No acute bony findings. Mild to moderate left hip joint degenerative changes. Electronically Signed   By: Marijo Sanes M.D.   On: 02/27/2015 11:24    Micro Results     Recent Results (from the past 240 hour(s))   Urine culture     Status: None   Collection Time: 02/26/15 12:26 PM  Result Value Ref Range Status   Specimen Description URINE, RANDOM  Final   Special Requests Normal  Final   Culture MULTIPLE SPECIES PRESENT, SUGGEST RECOLLECTION  Final   Report Status 02/27/2015 FINAL  Final   Today   Subjective    Carleene Mains today has no headache,no chest abdominal pain,no new weakness tingling or numbness, feels much better wants to go home today.     Objective   Blood pressure 186/76, pulse 69, temperature 97.2 F (36.2 C), temperature source Oral, resp. rate 18, height 5\' 1"  (1.549 m), weight 51.529 kg (  113 lb 9.6 oz), SpO2 97 %.   Intake/Output Summary (Last 24 hours) at 02/28/15 1053 Last data filed at 02/28/15 0700  Gross per 24 hour  Intake 1890.83 ml  Output   2150 ml  Net -259.17 ml    Exam Awake Alert, Oriented x 3, No new F.N deficits, Normal affect [place].AT,PERRAL Supple Neck,No JVD, No cervical lymphadenopathy appriciated.  Symmetrical Chest wall movement, Good air movement bilaterally, CTAB RRR,No Gallops,Rubs or new Murmurs, No Parasternal Heave +ve B.Sounds, Abd Soft, Non tender, No organomegaly appriciated, No rebound -guarding or rigidity. No Cyanosis, Clubbing or edema, No new Rash or bruise.    Data Review   CBC w Diff: Lab Results  Component Value Date   WBC 5.6 02/26/2015   HGB 12.0 02/26/2015   HCT 32.5* 02/26/2015   PLT 183 02/26/2015   LYMPHOPCT 27.1 12/12/2014   MONOPCT 7.0 12/12/2014   EOSPCT 3.2 12/12/2014   BASOPCT 0.4 12/12/2014    CMP: Lab Results  Component Value Date   NA 136 02/28/2015   K 3.6 02/28/2015   CL 106 02/28/2015   CO2 22 02/28/2015   BUN 9 02/28/2015   CREATININE 1.01* 02/28/2015   PROT 6.0* 02/26/2015   ALBUMIN 3.1* 02/26/2015   BILITOT 0.6 02/26/2015   ALKPHOS 52 02/26/2015   AST 22 02/26/2015   ALT 14 02/26/2015  .   Total Time in preparing paper work, data evaluation and todays exam - 35  minutes  Thurnell Lose M.D on 02/28/2015 at 10:53 AM  [facility]   Office  [phone]

## 2015-03-06 ENCOUNTER — Ambulatory Visit: Payer: Medicare Other | Admitting: Cardiovascular Disease

## 2015-03-08 ENCOUNTER — Encounter (HOSPITAL_COMMUNITY): Payer: Self-pay

## 2015-03-08 ENCOUNTER — Emergency Department (HOSPITAL_COMMUNITY): Payer: Medicare Other

## 2015-03-08 ENCOUNTER — Inpatient Hospital Stay (HOSPITAL_COMMUNITY)
Admission: EM | Admit: 2015-03-08 | Discharge: 2015-03-11 | DRG: 641 | Disposition: A | Discharge status: Home Health | Payer: Medicare Other | Attending: Internal Medicine | Admitting: Internal Medicine

## 2015-03-08 DIAGNOSIS — H8109 Meniere's disease, unspecified ear: Secondary | ICD-10-CM | POA: Diagnosis present

## 2015-03-08 DIAGNOSIS — Z95828 Presence of other vascular implants and grafts: Secondary | ICD-10-CM

## 2015-03-08 DIAGNOSIS — E871 Hypo-osmolality and hyponatremia: Principal | ICD-10-CM | POA: Diagnosis present

## 2015-03-08 DIAGNOSIS — E119 Type 2 diabetes mellitus without complications: Secondary | ICD-10-CM | POA: Diagnosis present

## 2015-03-08 DIAGNOSIS — I10 Essential (primary) hypertension: Secondary | ICD-10-CM | POA: Diagnosis present

## 2015-03-08 DIAGNOSIS — N179 Acute kidney failure, unspecified: Secondary | ICD-10-CM | POA: Diagnosis present

## 2015-03-08 DIAGNOSIS — E785 Hyperlipidemia, unspecified: Secondary | ICD-10-CM | POA: Diagnosis present

## 2015-03-08 DIAGNOSIS — Z7982 Long term (current) use of aspirin: Secondary | ICD-10-CM

## 2015-03-08 DIAGNOSIS — E0590 Thyrotoxicosis, unspecified without thyrotoxic crisis or storm: Secondary | ICD-10-CM | POA: Diagnosis not present

## 2015-03-08 DIAGNOSIS — I2510 Atherosclerotic heart disease of native coronary artery without angina pectoris: Secondary | ICD-10-CM | POA: Diagnosis present

## 2015-03-08 DIAGNOSIS — E0520 Thyrotoxicosis with toxic multinodular goiter without thyrotoxic crisis or storm: Secondary | ICD-10-CM | POA: Diagnosis present

## 2015-03-08 DIAGNOSIS — M810 Age-related osteoporosis without current pathological fracture: Secondary | ICD-10-CM | POA: Diagnosis present

## 2015-03-08 DIAGNOSIS — E876 Hypokalemia: Secondary | ICD-10-CM | POA: Diagnosis present

## 2015-03-08 DIAGNOSIS — E041 Nontoxic single thyroid nodule: Secondary | ICD-10-CM | POA: Diagnosis present

## 2015-03-08 DIAGNOSIS — I252 Old myocardial infarction: Secondary | ICD-10-CM

## 2015-03-08 DIAGNOSIS — R39198 Other difficulties with micturition: Secondary | ICD-10-CM | POA: Diagnosis present

## 2015-03-08 DIAGNOSIS — K219 Gastro-esophageal reflux disease without esophagitis: Secondary | ICD-10-CM | POA: Diagnosis present

## 2015-03-08 DIAGNOSIS — Z87891 Personal history of nicotine dependence: Secondary | ICD-10-CM | POA: Diagnosis not present

## 2015-03-08 DIAGNOSIS — M4854XA Collapsed vertebra, not elsewhere classified, thoracic region, initial encounter for fracture: Secondary | ICD-10-CM | POA: Diagnosis present

## 2015-03-08 DIAGNOSIS — R55 Syncope and collapse: Secondary | ICD-10-CM | POA: Diagnosis present

## 2015-03-08 LAB — CBC WITH DIFFERENTIAL/PLATELET
Basophils Absolute: 0 10*3/uL (ref 0.0–0.1)
Basophils Relative: 0 %
EOS ABS: 0 10*3/uL (ref 0.0–0.7)
EOS PCT: 1 %
HCT: 31.5 % — ABNORMAL LOW (ref 36.0–46.0)
Hemoglobin: 11.3 g/dL — ABNORMAL LOW (ref 12.0–15.0)
LYMPHS ABS: 1.3 10*3/uL (ref 0.7–4.0)
Lymphocytes Relative: 21 %
MCH: 28.5 pg (ref 26.0–34.0)
MCHC: 35.9 g/dL (ref 30.0–36.0)
MCV: 79.5 fL (ref 78.0–100.0)
MONOS PCT: 6 %
Monocytes Absolute: 0.4 10*3/uL (ref 0.1–1.0)
Neutro Abs: 4.5 10*3/uL (ref 1.7–7.7)
Neutrophils Relative %: 72 %
PLATELETS: 171 10*3/uL (ref 150–400)
RBC: 3.96 MIL/uL (ref 3.87–5.11)
RDW: 12.8 % (ref 11.5–15.5)
WBC: 6.2 10*3/uL (ref 4.0–10.5)

## 2015-03-08 LAB — COMPREHENSIVE METABOLIC PANEL
ALK PHOS: 50 U/L (ref 38–126)
ALT: 13 U/L — AB (ref 14–54)
AST: 20 U/L (ref 15–41)
Albumin: 3.2 g/dL — ABNORMAL LOW (ref 3.5–5.0)
Anion gap: 9 (ref 5–15)
BUN: 11 mg/dL (ref 6–20)
CHLORIDE: 80 mmol/L — AB (ref 101–111)
CO2: 28 mmol/L (ref 22–32)
CREATININE: 1.12 mg/dL — AB (ref 0.44–1.00)
Calcium: 8.2 mg/dL — ABNORMAL LOW (ref 8.9–10.3)
GFR calc Af Amer: 52 mL/min — ABNORMAL LOW (ref >60)
GFR, EST NON AFRICAN AMERICAN: 45 mL/min — AB (ref >60)
Glucose, Bld: 131 mg/dL — ABNORMAL HIGH (ref 65–99)
Potassium: 3.2 mmol/L — ABNORMAL LOW (ref 3.5–5.1)
Sodium: 117 mmol/L — CL (ref 135–145)
Total Bilirubin: 0.5 mg/dL (ref 0.3–1.2)
Total Protein: 5.6 g/dL — ABNORMAL LOW (ref 6.5–8.1)

## 2015-03-08 LAB — URINALYSIS, ROUTINE W REFLEX MICROSCOPIC
BILIRUBIN URINE: NEGATIVE
Glucose, UA: NEGATIVE mg/dL
Ketones, ur: NEGATIVE mg/dL
Leukocytes, UA: NEGATIVE
Nitrite: NEGATIVE
PROTEIN: 100 mg/dL — AB
Specific Gravity, Urine: 1.007 (ref 1.005–1.030)
pH: 7 (ref 5.0–8.0)

## 2015-03-08 LAB — URINE MICROSCOPIC-ADD ON

## 2015-03-08 LAB — TSH: TSH: 0.04 u[IU]/mL — ABNORMAL LOW (ref 0.350–4.500)

## 2015-03-08 LAB — MAGNESIUM: Magnesium: 1.5 mg/dL — ABNORMAL LOW (ref 1.7–2.4)

## 2015-03-08 LAB — TROPONIN I

## 2015-03-08 LAB — OSMOLALITY: OSMOLALITY: 256 mosm/kg — AB (ref 275–295)

## 2015-03-08 MED ORDER — DOCUSATE SODIUM 100 MG PO CAPS
200.0000 mg | ORAL_CAPSULE | Freq: Two times a day (BID) | ORAL | Status: DC
  Administered 2015-03-08 – 2015-03-11 (×6): 200 mg via ORAL
  Filled 2015-03-08 (×6): qty 2

## 2015-03-08 MED ORDER — ZOLPIDEM TARTRATE 5 MG PO TABS
5.0000 mg | ORAL_TABLET | Freq: Once | ORAL | Status: AC
  Administered 2015-03-08: 5 mg via ORAL
  Filled 2015-03-08: qty 1

## 2015-03-08 MED ORDER — IRBESARTAN 300 MG PO TABS: 300.0000 mg | ORAL_TABLET | Freq: Every day | ORAL | Status: DC

## 2015-03-08 MED ORDER — SODIUM CHLORIDE 0.9 % IV SOLN
INTRAVENOUS | Status: DC
  Administered 2015-03-08: 18:00:00 via INTRAVENOUS

## 2015-03-08 MED ORDER — SODIUM CHLORIDE 0.9 % IJ SOLN
3.0000 mL | Freq: Two times a day (BID) | INTRAMUSCULAR | Status: DC
  Administered 2015-03-08 – 2015-03-11 (×4): 3 mL via INTRAVENOUS

## 2015-03-08 MED ORDER — NITROGLYCERIN 0.4 MG SL SUBL: 0.4000 mg | SUBLINGUAL_TABLET | SUBLINGUAL | Status: DC | PRN

## 2015-03-08 MED ORDER — ONDANSETRON HCL 4 MG/2ML IJ SOLN: 4.0000 mg | Freq: Four times a day (QID) | INTRAMUSCULAR | Status: DC | PRN

## 2015-03-08 MED ORDER — ENOXAPARIN SODIUM 30 MG/0.3ML ~~LOC~~ SOLN
30.0000 mg | SUBCUTANEOUS | Status: DC
  Administered 2015-03-08 – 2015-03-10 (×3): 30 mg via SUBCUTANEOUS
  Filled 2015-03-08 (×3): qty 0.3

## 2015-03-08 MED ORDER — CYCLOBENZAPRINE HCL 5 MG PO TABS: 5.0000 mg | ORAL_TABLET | Freq: Three times a day (TID) | ORAL | Status: DC | PRN

## 2015-03-08 MED ORDER — POTASSIUM CHLORIDE CRYS ER 20 MEQ PO TBCR
40.0000 meq | EXTENDED_RELEASE_TABLET | Freq: Once | ORAL | Status: AC
  Administered 2015-03-08: 40 meq via ORAL
  Filled 2015-03-08: qty 2

## 2015-03-08 MED ORDER — ASPIRIN EC 325 MG PO TBEC
325.0000 mg | DELAYED_RELEASE_TABLET | Freq: Every day | ORAL | Status: DC
Start: 2015-03-08 — End: 2015-03-11
  Administered 2015-03-08 – 2015-03-10 (×3): 325 mg via ORAL
  Filled 2015-03-08 (×3): qty 1

## 2015-03-08 MED ORDER — SODIUM CHLORIDE 0.9 % IJ SOLN
3.0000 mL | Freq: Two times a day (BID) | INTRAMUSCULAR | Status: DC
  Administered 2015-03-08 – 2015-03-09 (×3): 3 mL via INTRAVENOUS

## 2015-03-08 MED ORDER — SIMVASTATIN 20 MG PO TABS
20.0000 mg | ORAL_TABLET | ORAL | Status: DC
  Administered 2015-03-09: 20 mg via ORAL
  Filled 2015-03-08: qty 1

## 2015-03-08 MED ORDER — CARVEDILOL 25 MG PO TABS
25.0000 mg | ORAL_TABLET | Freq: Two times a day (BID) | ORAL | Status: DC
  Administered 2015-03-08 – 2015-03-11 (×6): 25 mg via ORAL
  Filled 2015-03-08 (×6): qty 1

## 2015-03-08 MED ORDER — LABETALOL HCL 5 MG/ML IV SOLN
10.0000 mg | INTRAVENOUS | Status: DC | PRN
  Administered 2015-03-11: 10 mg via INTRAVENOUS
  Filled 2015-03-08: qty 4

## 2015-03-08 MED ORDER — SODIUM CHLORIDE 0.9 % IV SOLN: 250.0000 mL | INTRAVENOUS | Status: DC | PRN

## 2015-03-08 MED ORDER — OLMESARTAN MEDOXOMIL 40 MG PO TABS
40.0000 mg | ORAL_TABLET | Freq: Every day | ORAL | Status: DC
  Administered 2015-03-09 – 2015-03-11 (×3): 40 mg via ORAL
  Filled 2015-03-08 (×4): qty 1

## 2015-03-08 MED ORDER — DICYCLOMINE HCL 20 MG PO TABS
20.0000 mg | ORAL_TABLET | Freq: Two times a day (BID) | ORAL | Status: DC
  Administered 2015-03-08 – 2015-03-11 (×6): 20 mg via ORAL
  Filled 2015-03-08 (×6): qty 1

## 2015-03-08 MED ORDER — DIPHENHYDRAMINE HCL 25 MG PO CAPS: 25.0000 mg | ORAL_CAPSULE | Freq: Once | ORAL | Status: DC

## 2015-03-08 MED ORDER — ONDANSETRON HCL 4 MG PO TABS
4.0000 mg | ORAL_TABLET | Freq: Four times a day (QID) | ORAL | Status: DC | PRN
Start: 2015-03-08 — End: 2015-03-11

## 2015-03-08 MED ORDER — CLONIDINE HCL 0.1 MG PO TABS
0.1000 mg | ORAL_TABLET | Freq: Three times a day (TID) | ORAL | Status: DC
  Administered 2015-03-08 – 2015-03-11 (×8): 0.1 mg via ORAL
  Filled 2015-03-08 (×8): qty 1

## 2015-03-08 MED ORDER — PANTOPRAZOLE SODIUM 40 MG PO TBEC
40.0000 mg | DELAYED_RELEASE_TABLET | Freq: Every day | ORAL | Status: DC
  Administered 2015-03-08 – 2015-03-11 (×4): 40 mg via ORAL
  Filled 2015-03-08 (×4): qty 1

## 2015-03-08 MED ORDER — SODIUM CHLORIDE 0.9 % IJ SOLN: 3.0000 mL | INTRAMUSCULAR | Status: DC | PRN

## 2015-03-08 NOTE — ED Provider Notes (Signed)
CSN: [id number]     Arrival date & time 03/08/15  1505 History   First MD Initiated Contact with Patient 03/08/15 1531     Chief Complaint  Patient presents with  . Weakness     (Consider location/radiation/quality/duration/timing/severity/associated sxs/prior Treatment) HPI Comments: Patient is an 81 year old female with history of diabetes, coronary artery disease, hypertension, and recently diagnosed T12 compression fracture. She presents today for evaluation of syncope. She was sitting on the toilet attempting to urinate when she suddenly became dizzy and lost her equilibrium. She then went unresponsive for approximately 4 minutes according to her husband. She woke shortly thereafter and was at her normal baseline. There were convulsions, shaking activity, or confusion afterward. Her only complaint at this time is "her equilibrium is off".  Patient is a 81 y.o. female presenting with weakness. The history is provided by the patient.  Weakness This is a new problem. The current episode started less than 1 hour ago. The problem occurs constantly. The problem has been resolved. Pertinent negatives include no chest pain and no headaches. Nothing aggravates the symptoms. Nothing relieves the symptoms. She has tried nothing for the symptoms. The treatment provided no relief.    Past Medical History  Diagnosis Date  . CAD (coronary artery disease)   . Renal artery stenosis ([place])   . HTN (hypertension)   . Dyslipidemia   . GERD (gastroesophageal reflux disease)   . Osteoporosis   . Meniere disease     "for 20 years" takes Antivert 3 times per day  . Complication of anesthesia     "almost died when I had my breast reduction"  . Heart murmur   . Myocardial infarction ([place]) 2007  . Type II diabetes mellitus ([place])     "controlled w/diet and exercise only" (02/26/2015)  . History of hiatal hernia   . Seizures [facility])    Past Surgical History  Procedure Laterality Date  . Hip fracture  surgery Right 2011    "added rod in my leg and a ball"  . Wrist fracture surgery Left 2012  . Cholecystectomy    . Bladder suspension  x3  . Anterior cervical decomp/discectomy fusion  02/2002    Archie Endo 08/26/2010  . Reduction mammaplasty Bilateral 1985  . Renal artery stent Left 2012  . Peripheral vascular catheterization Left 12/20/2014    Procedure: Renal Angiography;  Surgeon: Wellington Hampshire, MD;  Location: [place];  Service: Cardiovascular;  Laterality: Left;  . Vaginal hysterectomy  ~ 1995  . Fracture surgery    . Back surgery    . Breast lumpectomy  11/2007    breast needle-localized lumpectomy/notes 08/12/2012  . Neck hardware removal  11/2002    Archie Endo 08/26/2010  . Cataract extraction w/ intraocular lens  implant, bilateral Bilateral   . Cardiac catheterization  2007    "couldn't get stents in; I almost died'   Family History  Problem Relation Age of Onset  . Diabetes type II Mother   . Heart attack Mother   . Heart disease Mother   . Congestive Heart Failure Father   . Pulmonary embolism Father   . Alzheimer's disease Sister   . Heart disease Sister   . Heart disease Brother   . Healthy Sister   . Healthy Sister   . Stroke Brother   . Dementia Brother     VASCULAR  . Stroke Brother   . Diabetes type II Brother   . Other Brother     KILLED  IN PLANE CRASH  . Pulmonary disease Sister   . Heart attack Sister   . Kidney failure Sister   . Diabetes type II Sister   . Diabetes type II Sister   . Kidney failure Sister    Social History  Substance Use Topics  . Smoking status: Former Smoker -- 1.00 packs/day for 30 years    Types: Cigarettes  . Smokeless tobacco: Never Used     Comment: "quit smoking in 1989"  . Alcohol Use: No   OB History    No data available     Review of Systems  Cardiovascular: Negative for chest pain.  Neurological: Positive for weakness. Negative for headaches.  All other systems reviewed and are  negative.     Allergies  Fentanyl; Morphine and related; Singulair; Actonel; Dilaudid; Fosamax; Hydralazine; Lipitor; Lisinopril; Micardis; Septra; Tekturna; Tricor; Actamin; Amlodipine; Avapro; Cardio complete; Cardizem; Ciprofloxacin; Codeine; Cyclobenzaprine; Forteo; Inapsine; Ivp dye; Shellfish allergy; and Tussionex pennkinetic er  Home Medications   Prior to Admission medications   Medication Sig Start Date End Date Taking? Authorizing Provider  aspirin 325 MG tablet Take 325 mg by mouth at bedtime.   Yes Historical Provider, MD  betamethasone dipropionate (DIPROLENE) 0.05 % cream Apply 1 application topically daily as needed (irritated skin).   Yes Historical Provider, MD  calcium citrate-vitamin D (CITRACAL+D) 315-200 MG-UNIT tablet Take 1 tablet by mouth 2 (two) times daily.   Yes Historical Provider, MD  carvedilol (COREG) 25 MG tablet Take 25 mg by mouth 2 (two) times daily with a meal.   Yes Historical Provider, MD  cholecalciferol (VITAMIN D) 1000 UNITS tablet Take 2,000 Units by mouth daily.    Yes Historical Provider, MD  cloNIDine (CATAPRES) 0.1 MG tablet Take 1 tablet (0.1 mg total) by mouth 2 (two) times daily. 01/02/15 01/18/16 Yes Wellington Hampshire, MD  Cod Liver Oil CAPS Take 1 capsule by mouth daily.   Yes Historical Provider, MD  denosumab (PROLIA) 60 MG/ML SOLN injection Inject 60 mg into the skin every 6 (six) months. Administer in upper arm, thigh, or abdomen   Yes Historical Provider, MD  esomeprazole (NEXIUM) 40 MG capsule Take 40 mg by mouth 2 (two) times daily.   Yes Historical Provider, MD  fexofenadine (ALLEGRA) 180 MG tablet Take 180 mg by mouth daily.   Yes Historical Provider, MD  HYDROcodone-acetaminophen (NORCO/VICODIN) 5-325 MG tablet Take 1 tablet by mouth every 6 (six) hours as needed for moderate pain.   Yes Historical Provider, MD  levofloxacin (LEVAQUIN) 750 MG tablet Take 750 mg by mouth every other day.   Yes Historical Provider, MD  meclizine  (ANTIVERT) 25 MG tablet Take 25 mg by mouth 3 (three) times daily as needed for dizziness.   Yes Historical Provider, MD  Multiple Vitamins-Minerals (PRESERVISION AREDS) CAPS Take 1 capsule by mouth daily.   Yes Historical Provider, MD  nitroGLYCERIN (NITROSTAT) 0.4 MG SL tablet Place 0.4 mg under the tongue every 5 (five) minutes as needed for chest pain (3 doses MAX).   Yes Historical Provider, MD  olmesartan (BENICAR) 40 MG tablet Take 20 mg by mouth daily.    Yes Historical Provider, MD  simvastatin (ZOCOR) 20 MG tablet Take 20 mg by mouth daily at 6 PM.  10/14/11  Yes Historical Provider, MD  triamterene-hydrochlorothiazide (MAXZIDE-25) 37.5-25 MG tablet Take 0.5 tablets by mouth every Monday, Wednesday, and Friday.   Yes Historical Provider, MD  VITAMIN E PO Take 1 capsule by mouth daily.  Yes Historical Provider, MD  ACCU-CHEK COMPACT PLUS test strip 1 each by Other route See admin instructions. Check blood sugar at bedtime 09/22/14   Historical Provider, MD  ACCU-CHEK SOFTCLIX LANCETS lancets 1 each by Other route See admin instructions. Check blood sugar at bedtime 10/09/14   Historical Provider, MD  Calcium Citrate (CITRACAL PO) Take 1,200 mg by mouth daily.    Historical Provider, MD  cyclobenzaprine (FLEXERIL) 5 MG tablet Take 1 tablet (5 mg total) by mouth 3 (three) times daily as needed for muscle spasms. 02/07/15   Silver Huguenin Elgergawy, MD  diclofenac sodium (VOLTAREN) 1 % GEL Apply 4 g topically 4 (four) times daily as needed (pain). 02/28/15   Thurnell Lose, MD  dicyclomine (BENTYL) 20 MG tablet Take 1 tablet (20 mg total) by mouth 2 (two) times daily. 01/30/15   Kaitlyn Szekalski, PA-C  docusate sodium (COLACE) 100 MG capsule Take 2 capsules (200 mg total) by mouth 2 (two) times daily. 02/28/15   Thurnell Lose, MD  meclizine (ANTIVERT) 25 MG tablet Take 25 mg by mouth 3 (three) times daily.     Historical Provider, MD  Multiple Vitamins-Minerals (PRESERVISION AREDS) CAPS Take 1  capsule by mouth 2 (two) times daily.     Historical Provider, MD  PREMARIN vaginal cream Place 1-2 g vaginally 2 (two) times a week. 10/06/14   Historical Provider, MD  promethazine (PHENERGAN) 25 MG tablet Take 0.5 tablets (12.5 mg total) by mouth every 6 (six) hours as needed for nausea or vomiting. 01/30/15   Kaitlyn Szekalski, PA-C   BP 185/59 mmHg  Pulse 63  Resp 18  SpO2 97% Physical Exam  Constitutional: She is oriented to person, place, and time. She appears well-developed and well-nourished. No distress.  HENT:  Head: Normocephalic and atraumatic.  Mouth/Throat: Oropharynx is clear and moist.  Eyes: EOM are normal. Pupils are equal, round, and reactive to light.  Neck: Normal range of motion. Neck supple.  Cardiovascular: Normal rate and regular rhythm.  Exam reveals no gallop and no friction rub.   No murmur heard. Pulmonary/Chest: Effort normal and breath sounds normal. No respiratory distress. She has no wheezes.  Abdominal: Soft. Bowel sounds are normal. She exhibits no distension. There is no tenderness.  Musculoskeletal: Normal range of motion. She exhibits no edema.  Neurological: She is alert and oriented to person, place, and time. No cranial nerve deficit. She exhibits normal muscle tone. Coordination normal.  Skin: Skin is warm and dry. She is not diaphoretic.  Nursing note and vitals reviewed.   ED Course  Procedures (including critical care time) Labs Review Labs Reviewed  COMPREHENSIVE METABOLIC PANEL  CBC WITH DIFFERENTIAL/PLATELET    Imaging Review No results found. I have personally reviewed and evaluated these images and lab results as part of my medical decision-making.   EKG Interpretation   Date/Time:  Thursday March 08 2015 18:04:06 EST Ventricular Rate:  68 PR Interval:  161 QRS Duration: 100 QT Interval:  450 QTC Calculation: 479 R Axis:   62 Text Interpretation:  Sinus rhythm Probable inferior infarct, old Probable  lateral infarct,  old Confirmed by Jathen Sudano  MD, Diarra Kos ([id number]) on 03/08/2015  6:36:13 PM      MDM   Final diagnoses:  None    Workup reveals a negative head CT, however laboratory studies do show a sodium of 117. Normal saline at maintenance rate was initiated and medicine has been consult for admission. I've spoken with Dr. Coralyn Pear who agrees to  admit.    Veryl Speak, MD 03/08/15 [phone]

## 2015-03-08 NOTE — H&P (Signed)
[facility] History and Physical  DUSTINA RIVENBURG [id number] DOB: [birth year] DOA: 03/08/2015  Referring physician:  PCP: Irven Shelling, MD   Chief Complaint: Dizziness/syncope  HPI: TOVE KOHLMEIER is a 81 y.o. female with a past medical history of diabetes mellitus, hypertension, history of hyponatremia who was recently hospitalized to the medicine service from 02/26/2015 through 02/28/2015 at which time she was treated for left hip pain and T12 compression fracture. She reports pain recently diagnosed with a urinary tract infection stating that her primary care physician prescribed an antibiotic last Monday. She reported feeling dizzy after taking that antibiotic then proceeded to drink at least 6-12 ounce bottles of water on a daily basis to "wash out the drug." According to Epic she has multiple drug allergies. She reports having dizziness and "feeling off balance" over the course of the week. Today while sitting on the commode she had a syncopal event. She denied focal neurological deficits, chest pain, palpitations, shortness of breath, visual changes, or other symptoms prior to event. This was witnessed by her husband who was with her at the time. He reported that she was out for about 4 minutes. Humidity called EMS. In the emergency department lab work revealed a sodium of 117.                                                Review of Systems:  Constitutional:  No weight loss, night sweats, Fevers, chills, fatigue. Positive for dizziness and episode of syncope today. HEENT:  No headaches, Difficulty swallowing,Tooth/dental problems,Sore throat,  No sneezing, itching, ear ache, nasal congestion, post nasal drip,  Cardio-vascular:  No chest pain, Orthopnea, PND, swelling in lower extremities, anasarca, dizziness, palpitations  GI:  No heartburn, indigestion, abdominal pain, nausea, vomiting, diarrhea, change in bowel habits, loss of appetite  Resp:  No shortness of breath  with exertion or at rest. No excess mucus, no productive cough, No non-productive cough, No coughing up of blood.No change in color of mucus.No wheezing.No chest wall deformity  Skin:  no rash or lesions.  GU:  no dysuria, change in color of urine, no urgency or frequency. No flank pain.  Musculoskeletal:  No joint pain or swelling. No decreased range of motion. No back pain.  Psych:  No change in mood or affect. No depression or anxiety. No memory loss.   Past Medical History  Diagnosis Date  . CAD (coronary artery disease)   . Renal artery stenosis ([place])   . HTN (hypertension)   . Dyslipidemia   . GERD (gastroesophageal reflux disease)   . Osteoporosis   . Meniere disease     "for 20 years" takes Antivert 3 times per day  . Complication of anesthesia     "almost died when I had my breast reduction"  . Heart murmur   . Myocardial infarction ([place]) 2007  . Type II diabetes mellitus ([place])     "controlled w/diet and exercise only" (02/26/2015)  . History of hiatal hernia   . Seizures [facility])    Past Surgical History  Procedure Laterality Date  . Hip fracture surgery Right 2011    "added rod in my leg and a ball"  . Wrist fracture surgery Left 2012  . Cholecystectomy    . Bladder suspension  x3  . Anterior cervical decomp/discectomy fusion  02/2002    Archie Endo 08/26/2010  .  Reduction mammaplasty Bilateral 1985  . Renal artery stent Left 2012  . Peripheral vascular catheterization Left 12/20/2014    Procedure: Renal Angiography;  Surgeon: Wellington Hampshire, MD;  Location: [place] [facility];  Service: Cardiovascular;  Laterality: Left;  . Vaginal hysterectomy  ~ 1995  . Fracture surgery    . Back surgery    . Breast lumpectomy  11/2007    breast needle-localized lumpectomy/notes 08/12/2012  . Neck hardware removal  11/2002    Archie Endo 08/26/2010  . Cataract extraction w/ intraocular lens  implant, bilateral Bilateral   . Cardiac catheterization  2007    "couldn't get stents in; I  almost died'   Social History:  reports that she has quit smoking. Her smoking use included Cigarettes. She has a 30 pack-year smoking history. She has never used smokeless tobacco. She reports that she does not drink alcohol or use illicit drugs.  Allergies  Allergen Reactions  . Fentanyl Anaphylaxis    Cardiac arrest and coded  . Morphine And Related Anaphylaxis    Cardiac arrest and coded Per Dr. Conley Canal patient has taken percocet without issue  . Singulair [Montelukast] Palpitations and Other (See Comments)    Increased BP and HR  . Actonel [Risedronate Sodium] Rash and Other (See Comments)    weakness  . Dilaudid [Hydromorphone Hcl] Rash and Other (See Comments)    Crying and screaming  . Fosamax [Alendronate Sodium] Rash and Other (See Comments)    Weakness and myalgias  . Hydralazine Diarrhea and Other (See Comments)    Swelling in stomach  . Lipitor [Atorvastatin] Swelling and Rash    Tongue swelling   . Lisinopril Diarrhea, Rash and Cough  . Micardis [Telmisartan] Diarrhea and Rash  . Septra [Sulfamethoxazole-Trimethoprim] Nausea And Vomiting and Rash  . Tekturna [Aliskiren] Swelling and Rash    Swelling of tongue  . Tricor [Fenofibrate] Rash and Other (See Comments)    Flu symptoms  . Actamin [Acetaminophen] Rash  . Amlodipine Diarrhea  . Avapro [Irbesartan] Rash  . Cardio Complete [Nutritional Supplements] Rash  . Cardizem [Diltiazem Hcl] Rash  . Ciprofloxacin Rash and Other (See Comments)    Black spot on body  . Codeine Rash  . Cyclobenzaprine Nausea Only  . Forteo [Teriparatide (Recombinant)] Rash  . Inapsine [Droperidol] Rash  . Ivp Dye [Iodinated Diagnostic Agents] Rash  . Shellfish Allergy Rash  . Tussionex Pennkinetic Er [Hydrocod Polst-Cpm Polst Er] Itching and Rash    Family History  Problem Relation Age of Onset  . Diabetes type II Mother   . Heart attack Mother   . Heart disease Mother   . Congestive Heart Failure Father   . Pulmonary  embolism Father   . Alzheimer's disease Sister   . Heart disease Sister   . Heart disease Brother   . Healthy Sister   . Healthy Sister   . Stroke Brother   . Dementia Brother     VASCULAR  . Stroke Brother   . Diabetes type II Brother   . Other Brother     KILLED IN PLANE CRASH  . Pulmonary disease Sister   . Heart attack Sister   . Kidney failure Sister   . Diabetes type II Sister   . Diabetes type II Sister   . Kidney failure Sister     Prior to Admission medications   Medication Sig Start Date End Date Taking? Authorizing Provider  aspirin 325 MG tablet Take 325 mg by mouth at bedtime.  Yes Historical Provider, MD  betamethasone dipropionate (DIPROLENE) 0.05 % cream Apply 1 application topically daily as needed (irritated skin).   Yes Historical Provider, MD  Calcium Citrate (CITRACAL PO) Take 1,200 mg by mouth daily.   Yes Historical Provider, MD  carvedilol (COREG) 25 MG tablet Take 25 mg by mouth 2 (two) times daily with a meal.   Yes Historical Provider, MD  cholecalciferol (VITAMIN D) 1000 UNITS tablet Take 2,000 Units by mouth daily.    Yes Historical Provider, MD  cloNIDine (CATAPRES) 0.1 MG tablet Take 1 tablet (0.1 mg total) by mouth 2 (two) times daily. 01/02/15 01/18/16 Yes Wellington Hampshire, MD  Cod Liver Oil CAPS Take 1 capsule by mouth daily.   Yes Historical Provider, MD  cyclobenzaprine (FLEXERIL) 5 MG tablet Take 1 tablet (5 mg total) by mouth 3 (three) times daily as needed for muscle spasms. 02/07/15  Yes Albertine Patricia, MD  denosumab (PROLIA) 60 MG/ML SOLN injection Inject 60 mg into the skin every 6 (six) months. Administer in upper arm, thigh, or abdomen   Yes Historical Provider, MD  diclofenac sodium (VOLTAREN) 1 % GEL Apply 4 g topically 4 (four) times daily as needed (pain). 02/28/15  Yes Thurnell Lose, MD  dicyclomine (BENTYL) 20 MG tablet Take 1 tablet (20 mg total) by mouth 2 (two) times daily. 01/30/15  Yes Kaitlyn Szekalski, PA-C  docusate  sodium (COLACE) 100 MG capsule Take 2 capsules (200 mg total) by mouth 2 (two) times daily. 02/28/15  Yes Thurnell Lose, MD  esomeprazole (NEXIUM) 40 MG capsule Take 40 mg by mouth 2 (two) times daily.   Yes Historical Provider, MD  fexofenadine (ALLEGRA) 180 MG tablet Take 180 mg by mouth daily.   Yes Historical Provider, MD  meclizine (ANTIVERT) 25 MG tablet Take 25 mg by mouth 3 (three) times daily.    Yes Historical Provider, MD  Multiple Vitamins-Minerals (PRESERVISION AREDS) CAPS Take 1 capsule by mouth 2 (two) times daily.    Yes Historical Provider, MD  nitroGLYCERIN (NITROSTAT) 0.4 MG SL tablet Place 0.4 mg under the tongue every 5 (five) minutes as needed for chest pain (3 doses MAX).   Yes Historical Provider, MD  olmesartan (BENICAR) 40 MG tablet Take 20 mg by mouth daily.    Yes Historical Provider, MD  PREMARIN vaginal cream Place 1-2 g vaginally 2 (two) times a week. 10/06/14  Yes Historical Provider, MD  promethazine (PHENERGAN) 25 MG tablet Take 0.5 tablets (12.5 mg total) by mouth every 6 (six) hours as needed for nausea or vomiting. 01/30/15  Yes Alvina Chou, PA-C  simvastatin (ZOCOR) 20 MG tablet Take 20 mg by mouth every Monday, Wednesday, and Friday.  10/14/11  Yes Historical Provider, MD  VITAMIN E PO Take 1 capsule by mouth daily.   Yes Historical Provider, MD   Physical Exam: Filed Vitals:   03/08/15 1700 03/08/15 1715 03/08/15 1730 03/08/15 1800  BP: 166/53 185/52 190/69 183/67  Pulse: 65 66 71 68  Resp: 14 17 21 21   SpO2: 97% 97% 98% 99%    Wt Readings from Last 3 Encounters:  02/28/15 51.529 kg (113 lb 9.6 oz)  02/05/15 56.337 kg (124 lb 3.2 oz)  01/29/15 55.821 kg (123 lb 1 oz)    General:  Appears calm and comfortable, she is in no acute distress, awake alert oriented 3 Eyes: PERRL, normal lids, irises & conjunctiva ENT: grossly normal hearing, lips & tongue, hydrated oral mucosa Neck: no LAD, masses or  thyromegaly Cardiovascular: RRR, no m/r/g. No  LE edema. Telemetry: SR, no arrhythmias  Respiratory: CTA bilaterally, no w/r/r. Normal respiratory effort. Abdomen: soft, ntnd Skin: no rash or induration seen on limited exam Musculoskeletal: grossly normal tone BUE/BLE, he did not have extremity edema Psychiatric: grossly normal mood and affect, speech fluent and appropriate Neurologic: grossly non-focal.          Labs on Admission:  Basic Metabolic Panel:  Recent Labs Lab 03/08/15 1610  NA 117*  K 3.2*  CL 80*  CO2 28  GLUCOSE 131*  BUN 11  CREATININE 1.12*  CALCIUM 8.2*   Liver Function Tests:  Recent Labs Lab 03/08/15 1610  AST 20  ALT 13*  ALKPHOS 50  BILITOT 0.5  PROT 5.6*  ALBUMIN 3.2*   No results for input(s): LIPASE, AMYLASE in the last 168 hours. No results for input(s): AMMONIA in the last 168 hours. CBC:  Recent Labs Lab 03/08/15 1610  WBC 6.2  NEUTROABS 4.5  HGB 11.3*  HCT 31.5*  MCV 79.5  PLT 171   Cardiac Enzymes: No results for input(s): CKTOTAL, CKMB, CKMBINDEX, TROPONINI in the last 168 hours.  BNP (last 3 results) No results for input(s): BNP in the last 8760 hours.  ProBNP (last 3 results) No results for input(s): PROBNP in the last 8760 hours.  CBG: No results for input(s): GLUCAP in the last 168 hours.  Radiological Exams on Admission: Ct Head Wo Contrast  03/08/2015  CLINICAL DATA:  Dizziness and syncope EXAM: CT HEAD WITHOUT CONTRAST TECHNIQUE: Contiguous axial images were obtained from the base of the skull through the vertex without intravenous contrast. COMPARISON:  None. FINDINGS: Chronic ischemic changes in the periventricular white matter and global atrophy are noted. No mass effect, midline shift, or acute hemorrhage. Mastoid air cells are clear. Cranium is intact. IMPRESSION: No acute intracranial pathology.  Chronic changes are noted. Electronically Signed   By: Marybelle Killings M.D.   On: 03/08/2015 18:05    EKG: Independently reviewed. EKG showing sinus  rhythm  Assessment/Plan Principal Problem:   Hyponatremia Active Problems:   Syncope   HTN (hypertension)   1. Hyponatremia. Mrs. Brignac is a pleasant 81 year old female having a history of hyponatremia complaining of dizziness and feeling "off balance" over the course of the week. She had a syncopal event today while sitting on the commode. Workup in the emergency room revealed a sodium of 117. Previous lab work obtained on 02/28/2015 show sodium of 136. She reports drinking large quantities of water over the course of the week to "washout" medication she took on Monday. Husband reports she had been drinking 5-6 12 ounce bottles of water on a daily basis. On physical examination she does not appear dry or have evidence of volume overload. I suspect this may be dilutional effect from water consumption. Will further workup with urine sodium, urine osmolality, serum osmolality, magnesium level. Patient will be treated with fluid restriction. Hypertonic saline not indicated at this time. Repeat labs in a.m. 2. Syncope. Patient having a syncopal event today while on the commode. This could be secondary to hyponatremia as initial lab work showed sodium of 117. Other possibilities include micturitional syncope. Will place her on continuous cardiac monitoring, check orthostatics and cycle troponins 3 sets. Will also obtain a transthoracic echocardiogram. 3. Hypertension. Patient's blood pressures elevated on presentation. We'll continue clonidine 0.1 mg by mouth 3 times a day, Coreg 25 mg by mouth twice a day and Benicar 40 mg by mouth daily.  Provide labetalol 10 mg IV every 2 hours for systolic blood pressures greater than 165. 4. Hypokalemia. Labs showed potassium of 3.2, will replace with oral potassium replacement, repeat labs in a.m. 5. History of coronary artery disease. Patient denies having episode of chest pain prior to syncopal event. Will continue her aspirin, beta blocker, ARB, statin. Troponins  will see be cycled 3 sets. Will obtain a transthoracic echocardiogram in a.m. to further workup cause of syncope. She'll be admitted to telemetry. 6. Dyslipidemia. Continue statin therapy 7. History of compression fracture. Patient on denosumab therapy in the outpatient setting.  8. DVT prophylaxis. Lovenox    Code Status: CODE STATUS discussed with family members and patient she is a full code Family Communication: Spoke to her husband and son present at bedside Disposition Plan: Will admit to telemetry, anticipate she will require rhythm 2 night hospitalization  Time spent: 65 minutes  Kelvin Cellar [facility] Pager [phone]

## 2015-03-08 NOTE — ED Notes (Signed)
Dr. Delo at bedside. 

## 2015-03-08 NOTE — ED Notes (Signed)
Attempted report 

## 2015-03-08 NOTE — ED Notes (Signed)
Per EMS: Pt voiding on the commode and began feeling faint and dizzy. Pt a/o x 4 upon arrival. CBG = 190, BP 183/65, HR 62. LSN 2pm today.

## 2015-03-08 NOTE — ED Notes (Signed)
Notified Dr Stark Jock of critical NA 117.

## 2015-03-08 NOTE — Progress Notes (Signed)
Called ED nurse for report. 

## 2015-03-09 ENCOUNTER — Inpatient Hospital Stay (HOSPITAL_COMMUNITY): Payer: Medicare Other

## 2015-03-09 DIAGNOSIS — I10 Essential (primary) hypertension: Secondary | ICD-10-CM

## 2015-03-09 DIAGNOSIS — E0590 Thyrotoxicosis, unspecified without thyrotoxic crisis or storm: Secondary | ICD-10-CM

## 2015-03-09 LAB — CBC
HEMATOCRIT: 29.2 % — AB (ref 36.0–46.0)
HEMOGLOBIN: 10.5 g/dL — AB (ref 12.0–15.0)
MCH: 28.5 pg (ref 26.0–34.0)
MCHC: 36 g/dL (ref 30.0–36.0)
MCV: 79.3 fL (ref 78.0–100.0)
Platelets: 161 10*3/uL (ref 150–400)
RBC: 3.68 MIL/uL — ABNORMAL LOW (ref 3.87–5.11)
RDW: 12.9 % (ref 11.5–15.5)
WBC: 6 10*3/uL (ref 4.0–10.5)

## 2015-03-09 LAB — BASIC METABOLIC PANEL
ANION GAP: 10 (ref 5–15)
BUN: 13 mg/dL (ref 6–20)
CHLORIDE: 89 mmol/L — AB (ref 101–111)
CO2: 23 mmol/L (ref 22–32)
Calcium: 7.9 mg/dL — ABNORMAL LOW (ref 8.9–10.3)
Creatinine, Ser: 1.24 mg/dL — ABNORMAL HIGH (ref 0.44–1.00)
GFR calc Af Amer: 46 mL/min — ABNORMAL LOW (ref >60)
GFR calc non Af Amer: 40 mL/min — ABNORMAL LOW (ref >60)
GLUCOSE: 106 mg/dL — AB (ref 65–99)
POTASSIUM: 3.9 mmol/L (ref 3.5–5.1)
SODIUM: 122 mmol/L — AB (ref 135–145)

## 2015-03-09 LAB — TROPONIN I: Troponin I: <0.03 ng/mL (ref <0.031)

## 2015-03-09 LAB — OSMOLALITY, URINE: OSMOLALITY UR: 172 mosm/kg — AB (ref 300–900)

## 2015-03-09 LAB — T4, FREE: FREE T4: 1.71 ng/dL — AB (ref 0.61–1.12)

## 2015-03-09 LAB — SODIUM, URINE, RANDOM: SODIUM UR: 23 mmol/L

## 2015-03-09 MED ORDER — MAGNESIUM SULFATE IN D5W 10-5 MG/ML-% IV SOLN
1.0000 g | Freq: Once | INTRAVENOUS | Status: AC
  Administered 2015-03-09: 1 g via INTRAVENOUS
  Filled 2015-03-09 (×2): qty 100

## 2015-03-09 MED ORDER — METHIMAZOLE 10 MG PO TABS
10.0000 mg | ORAL_TABLET | Freq: Two times a day (BID) | ORAL | Status: DC
Start: 2015-03-09 — End: 2015-03-11
  Administered 2015-03-09 – 2015-03-11 (×4): 10 mg via ORAL
  Filled 2015-03-09 (×7): qty 1

## 2015-03-09 NOTE — Progress Notes (Signed)
  Echocardiogram 2D Echocardiogram has been performed.  Donata Clay 03/09/2015, 9:33 AM

## 2015-03-09 NOTE — Progress Notes (Signed)
New Admission Note  Arrival: Via stretcher Mental Orientation: Alert & oriented x 4 Telemetry: On tele Skin: No skin issues verified by 2nd RN becca IV: Left wrist  Pain: no c/o of  pain Safety measures:  verbalized understanding. Bed in lowest position. Yellow bracelet on. Non-skid socks on. Bed alarm on. Family: Family at bedside. Orders have been reviewed and implemented. Will continue to monitor.

## 2015-03-09 NOTE — Progress Notes (Signed)
[facility] PROGRESS NOTE  [name] [id number] DOB: 1931-10-08 DOA: 03/08/2015 PCP: Irven Shelling, MD  Assessment/Plan: 1. Hyponatremia. -[name] presented to the emergency department with complaints of dizziness and feeling "off balance" over the course of the week. During this time she reported drinking large amounts of water, husband stating at least 5-6 12 ounce bottles of water on a daily basis. -She did not appear volume overloaded nor did she have evidence of dehydration on physical examination -Euvolemic hyponatremia suspected. -She was managed with fluid restriction, and repeat lab work this morning showing improvement to her sodium at 122. -She was ablated down the hallway today reporting improvement to dizziness as well. -Will continue fluid restriction  2.  Syncope -Patient having a syncopal event on day of admission that occurred while sitting on the commode. -Could be secondary to hyponatremia versus micturitional. -Workup thus far has included a transthoracic echocardiogram which revealed an ejection fraction of 60-65% with grade 1 diastolic dysfunction. There were no wall motion abnormalities -Cardiac enzymes were cycled 3 sets and remained negative. -There were no changes on telemetry  3.  Hyperthyroidism. -Lab work showing a TSH of 0.04 that was followed up with a free T4, back elevated at 1.7 -I think this may explain poorly controlled hypertension as well as weight loss -Will further workup with thyroid ultrasound -Meanwhile will start methimazole 10 mg by mouth twice a day. -Will refer to outpatient endocrinology on discharge  4.  Hypokalemia. -Improved with the administration of oral potassium replacement as a.m. lab work showing potassium of 3.9  5.  Hypomagnesemia. -Lab showing magnesium of 1.5 for which she was given 1 g of IV magnesium  Code Status: Full code Family Communication: I spoke to her husband was present at  bedside Disposition Plan: Anticipate discharge in the next 24-48 hours   HPI/Subjective: [name] is a pleasant 81 year old female with a history of diabetes mellitus, hypertension, having a history of hyponatremia. Was admitted to the medicine service on 03/08/2015 when she presented with complaints of dizziness over the past week having a syncopal event on the day of admission. She had stated drinking large quantities of water since Monday to "washout" medication that she had taken on Monday that she stated made her feel bad. Her syncope event occurred while on the commode. This was witnessed by her husband who was with her at the time. Lab work in the emergency department revealed a sodium level 117. She did not appear dry nor did she have evidence of [place] overloaded state on physical examination. She was managed with fluid restriction and repeat labs on the following day revealed improvement to her sodium levels at 122. Lab work also revealed suppressed TSH of 0.04 that was further worked up with a free T3, back elevated at 1.71.  Objective: Filed Vitals:   03/09/15 0452 03/09/15 0948  BP: 137/45 151/67  Pulse: 62 66  Temp: 97.8 F (36.6 C) 98.4 F (36.9 C)  Resp: 18 18    Intake/Output Summary (Last 24 hours) at 03/09/15 1615 Last data filed at 03/09/15 1355  Gross per 24 hour  Intake    225 ml  Output    650 ml  Net   -425 ml   Filed Weights   03/08/15 2100  Weight: 51.529 kg (113 lb 9.6 oz)    Exam:   General:  Patient is in no acute distress, she is ablated down the hallway, did fairly well reports improvement to her balance issues  Cardiovascular: Regular rate and rhythm normal S1-S2 no murmurs or gallops  Respiratory: Normal respiratory effort, lungs are clear to auscultation bilaterally  Abdomen: Soft nontender nondistended  Musculoskeletal: No edema  Data Reviewed: Basic Metabolic Panel:  Recent Labs Lab 03/08/15 1610 03/08/15 2132 03/09/15 0241  NA  117*  --  122*  K 3.2*  --  3.9  CL 80*  --  89*  CO2 28  --  23  GLUCOSE 131*  --  106*  BUN 11  --  13  CREATININE 1.12*  --  1.24*  CALCIUM 8.2*  --  7.9*  MG  --  1.5*  --    Liver Function Tests:  Recent Labs Lab 03/08/15 1610  AST 20  ALT 13*  ALKPHOS 50  BILITOT 0.5  PROT 5.6*  ALBUMIN 3.2*   No results for input(s): LIPASE, AMYLASE in the last 168 hours. No results for input(s): AMMONIA in the last 168 hours. CBC:  Recent Labs Lab 03/08/15 1610 03/09/15 0241  WBC 6.2 6.0  NEUTROABS 4.5  --   HGB 11.3* 10.5*  HCT 31.5* 29.2*  MCV 79.5 79.3  PLT 171 161   Cardiac Enzymes:  Recent Labs Lab 03/08/15 2132 03/09/15 0241 03/09/15 0835  TROPONINI <0.03 <0.03 <0.03   BNP (last 3 results) No results for input(s): BNP in the last 8760 hours.  ProBNP (last 3 results) No results for input(s): PROBNP in the last 8760 hours.  CBG: No results for input(s): GLUCAP in the last 168 hours.  No results found for this or any previous visit (from the past 240 hour(s)).   Studies: Ct Head Wo Contrast  03/08/2015  CLINICAL DATA:  Dizziness and syncope EXAM: CT HEAD WITHOUT CONTRAST TECHNIQUE: Contiguous axial images were obtained from the base of the skull through the vertex without intravenous contrast. COMPARISON:  None. FINDINGS: Chronic ischemic changes in the periventricular white matter and global atrophy are noted. No mass effect, midline shift, or acute hemorrhage. Mastoid air cells are clear. Cranium is intact. IMPRESSION: No acute intracranial pathology.  Chronic changes are noted. Electronically Signed   By: Marybelle Killings M.D.   On: 03/08/2015 18:05    Scheduled Meds: . aspirin EC  325 mg Oral QHS  . carvedilol  25 mg Oral BID WC  . cloNIDine  0.1 mg Oral TID  . dicyclomine  20 mg Oral BID  . docusate sodium  200 mg Oral BID  . enoxaparin (LOVENOX) injection  30 mg Subcutaneous Q24H  . olmesartan  40 mg Oral Daily  . pantoprazole  40 mg Oral Daily  .  simvastatin  20 mg Oral Q M,W,F  . sodium chloride  3 mL Intravenous Q12H  . sodium chloride  3 mL Intravenous Q12H   Continuous Infusions:   Principal Problem:   Hyponatremia Active Problems:   Syncope   HTN (hypertension)   Hypokalemia    Time spent: 35 min    Kelvin Cellar  [facility] Pager [phone]. If 7PM-7AM, please contact night-coverage at [email or web address], password [email or web address] 03/09/2015, 4:15 PM  LOS: 1 day

## 2015-03-10 DIAGNOSIS — N179 Acute kidney failure, unspecified: Secondary | ICD-10-CM

## 2015-03-10 LAB — CBC
HEMATOCRIT: 30.4 % — AB (ref 36.0–46.0)
HEMOGLOBIN: 11 g/dL — AB (ref 12.0–15.0)
MCH: 29.6 pg (ref 26.0–34.0)
MCHC: 36.2 g/dL — AB (ref 30.0–36.0)
MCV: 81.7 fL (ref 78.0–100.0)
Platelets: 161 10*3/uL (ref 150–400)
RBC: 3.72 MIL/uL — ABNORMAL LOW (ref 3.87–5.11)
RDW: 13.4 % (ref 11.5–15.5)
WBC: 7.1 10*3/uL (ref 4.0–10.5)

## 2015-03-10 LAB — MAGNESIUM: Magnesium: 1.9 mg/dL (ref 1.7–2.4)

## 2015-03-10 LAB — BASIC METABOLIC PANEL
Anion gap: 7 (ref 5–15)
BUN: 19 mg/dL (ref 6–20)
CHLORIDE: 91 mmol/L — AB (ref 101–111)
CO2: 28 mmol/L (ref 22–32)
Calcium: 8.3 mg/dL — ABNORMAL LOW (ref 8.9–10.3)
Creatinine, Ser: 1.64 mg/dL — ABNORMAL HIGH (ref 0.44–1.00)
GFR, EST AFRICAN AMERICAN: 33 mL/min — AB (ref >60)
GFR, EST NON AFRICAN AMERICAN: 28 mL/min — AB (ref >60)
GLUCOSE: 133 mg/dL — AB (ref 65–99)
POTASSIUM: 3.6 mmol/L (ref 3.5–5.1)
SODIUM: 126 mmol/L — AB (ref 135–145)

## 2015-03-10 LAB — T3, FREE: T3, Free: 3.2 pg/mL (ref 2.0–4.4)

## 2015-03-10 LAB — GLUCOSE, CAPILLARY: Glucose-Capillary: 129 mg/dL — ABNORMAL HIGH (ref 65–99)

## 2015-03-10 MED ORDER — SODIUM CHLORIDE 0.9 % IV BOLUS (SEPSIS)
1000.0000 mL | Freq: Once | INTRAVENOUS | Status: AC
  Administered 2015-03-10: 1000 mL via INTRAVENOUS

## 2015-03-10 MED ORDER — MECLIZINE HCL 25 MG PO TABS
25.0000 mg | ORAL_TABLET | Freq: Three times a day (TID) | ORAL | Status: DC
  Administered 2015-03-10 – 2015-03-11 (×4): 25 mg via ORAL
  Filled 2015-03-10 (×4): qty 1

## 2015-03-10 MED ORDER — SODIUM CHLORIDE 0.9 % IV BOLUS (SEPSIS): 500.0000 mL | Freq: Once | INTRAVENOUS | Status: DC

## 2015-03-10 NOTE — Progress Notes (Signed)
[facility] PROGRESS NOTE  [name] [id number] DOB: Dec 28, 1931 DOA: 03/08/2015 PCP: Irven Shelling, MD  Assessment/Plan: 1. Hyponatremia. -Cathy Cathy presented to the emergency department with complaints of dizziness and feeling "off balance" over the course of the week. During this time she reported drinking large amounts of water, husband stating at least 5-6 12 ounce bottles of water on a daily basis. -She did not appear volume overloaded nor did she have evidence of dehydration on physical examination -Euvolemic hyponatremia suspected. -She was managed with fluid restriction, and repeat lab work on 03/09/2015 showing improvement to her sodium at 122, on this day she was also ambulated down the hallway and did well. Fluid restriction was continued. -On 03/10/2015 repeat lab work showed ongoing improvement to sodium at 126.   2.  Syncope -Patient having a syncopal event on day of admission that occurred while sitting on the commode. -Could be secondary to hyponatremia versus micturitional. -Workup thus far has included a transthoracic echocardiogram which revealed an ejection fraction of 60-65% with grade 1 diastolic dysfunction. There were no wall motion abnormalities -Cardiac enzymes were cycled 3 sets and remained negative. -There were no changes on telemetry  3.  Hyperthyroidism. -Lab work showing a TSH of 0.04 that was followed up with a free T4, back elevated at 1.7 -I think this may explain poorly controlled hypertension as well as weight loss -Thyroid ultrasound revealing multinodular goiter with dominant right thyroid nodule. May need further workup with biopsy. Discussed this with family members who will follow-up with endocrinology in the outpatient setting. -She was started on methimazole 10 mg by mouth twice a day. -On discharge plan to discharge her to Dr Buddy Duty of [facility].   4.  Hypokalemia. -Improved with the administration of oral  potassium replacement as a.m. lab work showing potassium of 3.9  5.  Hypomagnesemia. -Lab showing magnesium of 1.5 for which she was given 1 g of IV magnesium  6.  Acute kidney injury -A.m. lab work showing an upward trend in her creatinine to 1.6 from 1.24. Will stop fluid restriction -She was bolused with a liter of normal saline today -Plan to repeat labs in a.m.  Code Status: Full code Family Communication: I spoke to her husband was present at bedside Disposition Plan: Anticipate discharge in the next 24 hours   HPI/Subjective: [name] is a pleasant 81 year old female with a history of diabetes mellitus, hypertension, having a history of hyponatremia. Was admitted to the medicine service on 03/08/2015 when she presented with complaints of dizziness over the past week having a syncopal event on the day of admission. She had stated drinking large quantities of water since Monday to "washout" medication that she had taken on Monday that she stated made her feel bad. Her syncope event occurred while on the commode. This was witnessed by her husband who was with her at the time. Lab work in the emergency department revealed a sodium level 117. She did not appear dry nor did she have evidence of [place] overloaded state on physical examination. She was managed with fluid restriction and repeat labs on the following day revealed improvement to her sodium levels at 122. Lab work also revealed suppressed TSH of 0.04 that was further worked up with a free T3, back elevated at 1.71.  Objective: Filed Vitals:   03/10/15 0500 03/10/15 0931  BP: 148/50 181/52  Pulse: 59 65  Temp: 97.3 F (36.3 C) 97.7 F (36.5 C)  Resp: 16 18  Intake/Output Summary (Last 24 hours) at 03/10/15 1609 Last data filed at 03/10/15 1300  Gross per 24 hour  Intake    720 ml  Output    400 ml  Net    320 ml   Filed Weights   03/08/15 2100 03/09/15 2040  Weight: 51.529 kg (113 lb 9.6 oz) 51.302 kg (113 lb 1.6 oz)     Exam:   General:  Patient is in no acute distress, shereported having dizziness today that she attributes to vertigo   Cardiovascular: Regular rate and rhythm normal S1-S2 no murmurs or gallops  Respiratory: Normal respiratory effort, lungs are clear to auscultation bilaterally  Abdomen: Soft nontender nondistended  Musculoskeletal: No edema  Data Reviewed: Basic Metabolic Panel:  Recent Labs Lab 03/08/15 1610 03/08/15 2132 03/09/15 0241 03/10/15 0513  NA 117*  --  122* 126*  K 3.2*  --  3.9 3.6  CL 80*  --  89* 91*  CO2 28  --  23 28  GLUCOSE 131*  --  106* 133*  BUN 11  --  13 19  CREATININE 1.12*  --  1.24* 1.64*  CALCIUM 8.2*  --  7.9* 8.3*  MG  --  1.5*  --  1.9   Liver Function Tests:  Recent Labs Lab 03/08/15 1610  AST 20  ALT 13*  ALKPHOS 50  BILITOT 0.5  PROT 5.6*  ALBUMIN 3.2*   No results for input(s): LIPASE, AMYLASE in the last 168 hours. No results for input(s): AMMONIA in the last 168 hours. CBC:  Recent Labs Lab 03/08/15 1610 03/09/15 0241 03/10/15 0513  WBC 6.2 6.0 7.1  NEUTROABS 4.5  --   --   HGB 11.3* 10.5* 11.0*  HCT 31.5* 29.2* 30.4*  MCV 79.5 79.3 81.7  PLT 171 161 161   Cardiac Enzymes:  Recent Labs Lab 03/08/15 2132 03/09/15 0241 03/09/15 0835  TROPONINI <0.03 <0.03 <0.03   BNP (last 3 results) No results for input(s): BNP in the last 8760 hours.  ProBNP (last 3 results) No results for input(s): PROBNP in the last 8760 hours.  CBG:  Recent Labs Lab 03/10/15 0717  GLUCAP 129*    No results found for this or any previous visit (from the past 240 hour(s)).   Studies: Ct Head Wo Contrast  03/08/2015  CLINICAL DATA:  Dizziness and syncope EXAM: CT HEAD WITHOUT CONTRAST TECHNIQUE: Contiguous axial images were obtained from the base of the skull through the vertex without intravenous contrast. COMPARISON:  None. FINDINGS: Chronic ischemic changes in the periventricular white matter and global atrophy are  noted. No mass effect, midline shift, or acute hemorrhage. Mastoid air cells are clear. Cranium is intact. IMPRESSION: No acute intracranial pathology.  Chronic changes are noted. Electronically Signed   By: Marybelle Killings M.D.   On: 03/08/2015 18:05   US Soft Tissue Head/neck  03/09/2015  CLINICAL DATA:  Hyperthyroidism. EXAM: THYROID ULTRASOUND TECHNIQUE: Ultrasound examination of the thyroid gland and adjacent soft tissues was performed. COMPARISON:  None. FINDINGS: Right thyroid lobe Measurements: 4.0 x 2.4 x 1.9 cm. Multiple heterogeneous nodules in the right thyroid lobe. Nodule in the superior right thyroid lobe measures up to 0.7 cm. There is a heterogeneous nodule in the mid right thyroid lobe with multiple echogenic foci which could represent inspissated colloid. This nodule measures up to 1.2 cm. Largest right thyroid nodule is in the mid/inferior aspect and measures 2.1 x 2.0 x 1.8 cm. This nodule has a few echogenic foci which  could represent inspissated colloid. Small heterogeneous nodule in the inferior right thyroid lobe measuring up to 0.8 cm. Left thyroid lobe Measurements: 4.3 x 1.5 x 1.7 cm. Heterogeneous nodule in the mid left thyroid lobe measures up to 1.1 cm. Hypoechoic nodule along the inferior left thyroid lobe measures 1.0 cm. Isthmus Thickness: 0.6 cm. Along the left side of the isthmus, there is a small heterogeneous nodule measuring up to 0.7 cm. Along the right side of the isthmus, there is a solid nodule measuring 1.9 x 1.8 x 1.4 cm. Lymphadenopathy None visualized. IMPRESSION: Multi nodular goiter. The dominant right thyroid nodule and the dominant isthmus nodule both meet criteria for ultrasound-guided biopsy. This recommendation follows the consensus statement: Management of Thyroid Nodules Detected at [place]: Society of Radiologists in [place]. Radiology 2005; [id number]. Electronically Signed   By: Markus Daft M.D.   On: 03/09/2015 17:18     Scheduled Meds: . aspirin EC  325 mg Oral QHS  . carvedilol  25 mg Oral BID WC  . cloNIDine  0.1 mg Oral TID  . dicyclomine  20 mg Oral BID  . docusate sodium  200 mg Oral BID  . enoxaparin (LOVENOX) injection  30 mg Subcutaneous Q24H  . meclizine  25 mg Oral TID  . methimazole  10 mg Oral BID  . olmesartan  40 mg Oral Daily  . pantoprazole  40 mg Oral Daily  . simvastatin  20 mg Oral Q M,W,F  . sodium chloride  3 mL Intravenous Q12H  . sodium chloride  3 mL Intravenous Q12H   Continuous Infusions:   Principal Problem:   Hyponatremia Active Problems:   Syncope   HTN (hypertension)   Hypokalemia    Time spent: 35 min    Kelvin Cellar  [facility] Pager [phone]. If 7PM-7AM, please contact night-coverage at [email or web address], password [facility] 03/10/2015, 4:09 PM  LOS: 2 days

## 2015-03-11 DIAGNOSIS — E041 Nontoxic single thyroid nodule: Secondary | ICD-10-CM

## 2015-03-11 DIAGNOSIS — E0590 Thyrotoxicosis, unspecified without thyrotoxic crisis or storm: Secondary | ICD-10-CM | POA: Diagnosis present

## 2015-03-11 LAB — CBC
HCT: 26.9 % — ABNORMAL LOW (ref 36.0–46.0)
HEMOGLOBIN: 9.4 g/dL — AB (ref 12.0–15.0)
MCH: 29 pg (ref 26.0–34.0)
MCHC: 34.9 g/dL (ref 30.0–36.0)
MCV: 83 fL (ref 78.0–100.0)
PLATELETS: 159 10*3/uL (ref 150–400)
RBC: 3.24 MIL/uL — AB (ref 3.87–5.11)
RDW: 13.4 % (ref 11.5–15.5)
WBC: 6.2 10*3/uL (ref 4.0–10.5)

## 2015-03-11 LAB — BASIC METABOLIC PANEL
ANION GAP: 6 (ref 5–15)
BUN: 17 mg/dL (ref 6–20)
CALCIUM: 8.3 mg/dL — AB (ref 8.9–10.3)
CHLORIDE: 98 mmol/L — AB (ref 101–111)
CO2: 28 mmol/L (ref 22–32)
CREATININE: 1.25 mg/dL — AB (ref 0.44–1.00)
GFR calc non Af Amer: 39 mL/min — ABNORMAL LOW (ref >60)
GFR, EST AFRICAN AMERICAN: 45 mL/min — AB (ref >60)
Glucose, Bld: 127 mg/dL — ABNORMAL HIGH (ref 65–99)
Potassium: 3.7 mmol/L (ref 3.5–5.1)
SODIUM: 132 mmol/L — AB (ref 135–145)

## 2015-03-11 MED ORDER — ZOLPIDEM TARTRATE 5 MG PO TABS
5.0000 mg | ORAL_TABLET | Freq: Every evening | ORAL | Status: DC | PRN
  Administered 2015-03-11: 5 mg via ORAL
  Filled 2015-03-11: qty 1

## 2015-03-11 MED ORDER — METHIMAZOLE 10 MG PO TABS
10.0000 mg | ORAL_TABLET | Freq: Two times a day (BID) | ORAL | Status: DC
Start: 2015-03-11 — End: 2015-09-11

## 2015-03-11 MED ORDER — CLONIDINE HCL 0.1 MG PO TABS
0.1000 mg | ORAL_TABLET | Freq: Three times a day (TID) | ORAL | Status: DC
Start: 2015-03-11 — End: 2015-10-04

## 2015-03-11 NOTE — Discharge Summary (Signed)
Physician Discharge Summary  TEXAS ZOGG [id number] DOB: 1931/08/11 DOA: 03/08/2015  PCP: Irven Shelling, MD  Admit date: 03/08/2015 Discharge date: 03/11/2015  Time spent: 35 minutes  Recommendations for Outpatient Follow-up:  1. Please follow-up on hyperthyroidism, during this hospitalization she was found to have a suppressed TSH of 0.04 with a free T4 1 0.7. This was further evaluated by thyroid ultrasound revealing multinodular goiter with dominant right nodule. She was discharged on methimazole 10 mg by mouth twice a day and will need outpatient follow-up appointment with Dr. Buddy Duty of [facility]. May need ultrasound-guided biopsy of thyroid nodule. 2. Follow up on BMP on file hospital follow-up visit, she presented with sodium of 117, improved to 132 on discharge. 3. She was set up with home health services for home PT on discharge.   Discharge Diagnoses:  Principal Problem:   Hyponatremia Active Problems:   Syncope   HTN (hypertension)   Hypokalemia   Hyperthyroidism   Thyroid nodule   Discharge Condition: Stable/improved  Diet recommendation: Heart healthy  Filed Weights   03/08/15 2100 03/09/15 2040 03/10/15 2100  Weight: 51.529 kg (113 lb 9.6 oz) 51.302 kg (113 lb 1.6 oz) 51.665 kg (113 lb 14.4 oz)    History of present illness:  [name] is a 81 y.o. female with a past medical history of diabetes mellitus, hypertension, history of hyponatremia who was recently hospitalized to the medicine service from 02/26/2015 through 02/28/2015 at which time she was treated for left hip pain and T12 compression fracture. She reports pain recently diagnosed with a urinary tract infection stating that her primary care physician prescribed an antibiotic last Monday. She reported feeling dizzy after taking that antibiotic then proceeded to drink at least 6-12 ounce bottles of water on a daily basis to "wash out the drug." According to Epic she has multiple drug  allergies. She reports having dizziness and "feeling off balance" over the course of the week. Today while sitting on the commode she had a syncopal event. She denied focal neurological deficits, chest pain, palpitations, shortness of breath, visual changes, or other symptoms prior to event. This was witnessed by her husband who was with her at the time. He reported that she was out for about 4 minutes. Humidity called EMS. In the emergency department lab work revealed a sodium of 117.  Hospital Course:  [name] is a pleasant 81 year old female with a history of diabetes mellitus, hypertension, having a history of hyponatremia. Was admitted to the medicine service on 03/08/2015 when she presented with complaints of dizziness over the past week having a syncopal event on the day of admission. She had stated drinking large quantities of water since Monday to "washout" medication that she had taken on Monday that she stated made her feel bad. Her syncope event occurred while on the commode. This was witnessed by her husband who was with her at the time. Lab work in the emergency department revealed a sodium level 117. She did not appear dry nor did she have evidence of Lyme overloaded state on physical examination. She was managed with fluid restriction and repeat labs on the following day revealed improvement to her sodium levels at 122. Lab work also revealed suppressed TSH of 0.04 that was further worked up with a free T3, back elevated at 1.71.   Hyponatremia. -Cathy Cressey presented to the emergency department with complaints of dizziness and feeling "off balance" over the course of the week. During this time she reported drinking  large amounts of water, husband stating at least 5-6 12 ounce bottles of water on a daily basis. -She did not appear volume overloaded nor did she have evidence of dehydration on physical examination -Euvolemic hyponatremia suspected. -She was managed with fluid  restriction, and repeat lab work on 03/09/2015 showing improvement to her sodium at 122, on this day she was also ambulated down the hallway and did well. Fluid restriction was continued. -On 03/10/2015 repeat lab work showed ongoing improvement to sodium at 126. -By 03/11/2015 her sodium had improved to 132. Please follow-up on repeat BMP on hospital follow-up visit.   2. Syncope -Patient having a syncopal event on day of admission that occurred while sitting on the commode. -Could be secondary to hyponatremia versus micturitional. -Workup thus far has included a transthoracic echocardiogram which revealed an ejection fraction of 60-65% with grade 1 diastolic dysfunction. There were no wall motion abnormalities -Cardiac enzymes were cycled 3 sets and remained negative. -There were no changes on telemetry  3. Hyperthyroidism. -Lab work showing a TSH of 0.04 that was followed up with a free T4, back elevated at 1.7 -I think this may explain poorly controlled hypertension as well as weight loss -Thyroid ultrasound revealing multinodular goiter with dominant right thyroid nodule. May need further workup with biopsy. Discussed this with family members who will follow-up with endocrinology in the outpatient setting. -She was discharged on methimazole 10 mg by mouth twice a day. -On discharge plan for her to Dr Buddy Duty of [facility].   4. Hypokalemia. -Improved with the administration of oral potassium replacement as a.m. lab work showing potassium of 3.9  5. Hypomagnesemia. -Lab showing magnesium of 1.5 for which she was given 1 g of IV magnesium -Repeat lab work showing magnesium of 1.9  6. Acute kidney injury -A.m. lab work showing an upward trend in her creatinine to 1.6 from 1.24. Will stop fluid restriction -She was bolused with a liter of normal saline on 03/10/2015 -Repeat labs on 03/11/2015 showed improved creatinine 1.25 with BUN is 17.    Discharge Exam: Filed  Vitals:   03/11/15 0500 03/11/15 0936  BP: 185/46 143/82  Pulse: 66 64  Temp: 98.3 F (36.8 C) 98 F (36.7 C)  Resp: 18 18     General: Patient is in no acute distress, she reports feeling better and ambulated down the hallway with a walker today. Reports improvement to her dizziness  Cardiovascular: Regular rate and rhythm normal S1-S2 no murmurs or gallops  Respiratory: Normal respiratory effort, lungs are clear to auscultation bilaterally  Abdomen: Soft nontender nondistended  Musculoskeletal: No edema         Discharge Instructions   Discharge Instructions    Call MD for:  difficulty breathing, headache or visual disturbances    Complete by:  As directed      Call MD for:  extreme fatigue    Complete by:  As directed      Call MD for:  hives    Complete by:  As directed      Call MD for:  persistant dizziness or light-headedness    Complete by:  As directed      Call MD for:  persistant nausea and vomiting    Complete by:  As directed      Call MD for:  redness, tenderness, or signs of infection (pain, swelling, redness, odor or green/yellow discharge around incision site)    Complete by:  As directed      Call  MD for:  severe uncontrolled pain    Complete by:  As directed      Call MD for:  temperature >100.4    Complete by:  As directed      Call MD for:    Complete by:  As directed      Diet - low sodium heart healthy    Complete by:  As directed      Increase activity slowly    Complete by:  As directed           Current Discharge Medication List    START taking these medications   Details  methimazole (TAPAZOLE) 10 MG tablet Take 1 tablet (10 mg total) by mouth 2 (two) times daily. Qty: 60 tablet, Refills: 1      CONTINUE these medications which have CHANGED   Details  cloNIDine (CATAPRES) 0.1 MG tablet Take 1 tablet (0.1 mg total) by mouth 3 (three) times daily. Qty: 60 tablet, Refills: 11      CONTINUE these medications which have NOT  CHANGED   Details  aspirin 325 MG tablet Take 325 mg by mouth at bedtime.    betamethasone dipropionate (DIPROLENE) 0.05 % cream Apply 1 application topically daily as needed (irritated skin).    Calcium Citrate (CITRACAL PO) Take 1,200 mg by mouth daily.    carvedilol (COREG) 25 MG tablet Take 25 mg by mouth 2 (two) times daily with a meal.    cholecalciferol (VITAMIN D) 1000 UNITS tablet Take 2,000 Units by mouth daily.     Cod Liver Oil CAPS Take 1 capsule by mouth daily.    cyclobenzaprine (FLEXERIL) 5 MG tablet Take 1 tablet (5 mg total) by mouth 3 (three) times daily as needed for muscle spasms. Qty: 20 tablet, Refills: 0    denosumab (PROLIA) 60 MG/ML SOLN injection Inject 60 mg into the skin every 6 (six) months. Administer in upper arm, thigh, or abdomen    diclofenac sodium (VOLTAREN) 1 % GEL Apply 4 g topically 4 (four) times daily as needed (pain). Qty: 1 Tube, Refills: 0    dicyclomine (BENTYL) 20 MG tablet Take 1 tablet (20 mg total) by mouth 2 (two) times daily. Qty: 20 tablet, Refills: 0    docusate sodium (COLACE) 100 MG capsule Take 2 capsules (200 mg total) by mouth 2 (two) times daily. Qty: 20 capsule, Refills: 0    esomeprazole (NEXIUM) 40 MG capsule Take 40 mg by mouth 2 (two) times daily.    fexofenadine (ALLEGRA) 180 MG tablet Take 180 mg by mouth daily.    meclizine (ANTIVERT) 25 MG tablet Take 25 mg by mouth 3 (three) times daily.     Multiple Vitamins-Minerals (PRESERVISION AREDS) CAPS Take 1 capsule by mouth 2 (two) times daily.     nitroGLYCERIN (NITROSTAT) 0.4 MG SL tablet Place 0.4 mg under the tongue every 5 (five) minutes as needed for chest pain (3 doses MAX).    olmesartan (BENICAR) 40 MG tablet Take 20 mg by mouth daily.     PREMARIN vaginal cream Place 1-2 g vaginally 2 (two) times a week. Refills: 4    promethazine (PHENERGAN) 25 MG tablet Take 0.5 tablets (12.5 mg total) by mouth every 6 (six) hours as needed for nausea or  vomiting. Qty: 12 tablet, Refills: 0    simvastatin (ZOCOR) 20 MG tablet Take 20 mg by mouth every Monday, Wednesday, and Friday.     VITAMIN E PO Take 1 capsule by mouth daily.  Allergies  Allergen Reactions  . Fentanyl Anaphylaxis    Cardiac arrest and coded  . Morphine And Related Anaphylaxis    Cardiac arrest and coded Per Dr. Conley Canal patient has taken percocet without issue  . Singulair [Montelukast] Palpitations and Other (See Comments)    Increased BP and HR  . Actonel [Risedronate Sodium] Rash and Other (See Comments)    weakness  . Dilaudid [Hydromorphone Hcl] Rash and Other (See Comments)    Crying and screaming  . Fosamax [Alendronate Sodium] Rash and Other (See Comments)    Weakness and myalgias  . Hydralazine Diarrhea and Other (See Comments)    Swelling in stomach  . Lipitor [Atorvastatin] Swelling and Rash    Tongue swelling   . Lisinopril Diarrhea, Rash and Cough  . Micardis [Telmisartan] Diarrhea and Rash  . Septra [Sulfamethoxazole-Trimethoprim] Nausea And Vomiting and Rash  . Tekturna [Aliskiren] Swelling and Rash    Swelling of tongue  . Tricor [Fenofibrate] Rash and Other (See Comments)    Flu symptoms  . Actamin [Acetaminophen] Rash  . Amlodipine Diarrhea  . Avapro [Irbesartan] Rash  . Cardio Complete [Nutritional Supplements] Rash  . Cardizem [Diltiazem Hcl] Rash  . Ciprofloxacin Rash and Other (See Comments)    Black spot on body  . Codeine Rash  . Cyclobenzaprine Nausea Only  . Forteo [Teriparatide (Recombinant)] Rash  . Inapsine [Droperidol] Rash  . Ivp Dye [Iodinated Diagnostic Agents] Rash  . Shellfish Allergy Rash  . Tussionex Pennkinetic Er [Hydrocod Polst-Cpm Polst Er] Itching and Rash   Follow-up Information    Follow up with KERR,JEFFREY, MD In 1 week.   Specialty:  Endocrinology   Contact information:   [address] [place] [address] [phone]       Follow up with Irven Shelling, MD In 2  weeks.   Specialty:  Internal Medicine   Contact information:   [address] [place] [address] [phone]        The results of significant diagnostics from this hospitalization (including imaging, microbiology, ancillary and laboratory) are listed below for reference.    Significant Diagnostic Studies: Dg Chest 2 View  02/26/2015  CLINICAL DATA:  Chest pain. Nausea and vomiting. Weakness. Neck pain. EXAM: CHEST  2 VIEW COMPARISON:  12/21/2011 and 12/12/2004 and CT scan of the abdomen dated 01/30/2015 FINDINGS: Heart size and pulmonary vascularity are normal and the lungs are clear. The patient has developed a compression fracture of the inferior aspect T12 since the prior CT scan 01/30/2015. Calcification in the thoracic aorta. IMPRESSION: New compression fracture of the inferior aspect of T12 since 01/30/2015. Heart and lungs appear normal. Aortic atherosclerosis. Electronically Signed   By: Lorriane Shire M.D.   On: 02/26/2015 07:56   Ct Head Wo Contrast  03/08/2015  CLINICAL DATA:  Dizziness and syncope EXAM: CT HEAD WITHOUT CONTRAST TECHNIQUE: Contiguous axial images were obtained from the base of the skull through the vertex without intravenous contrast. COMPARISON:  None. FINDINGS: Chronic ischemic changes in the periventricular white matter and global atrophy are noted. No mass effect, midline shift, or acute hemorrhage. Mastoid air cells are clear. Cranium is intact. IMPRESSION: No acute intracranial pathology.  Chronic changes are noted. Electronically Signed   By: Marybelle Killings M.D.   On: 03/08/2015 18:05   Ct Pelvis Wo Contrast  02/27/2015  CLINICAL DATA:  History of prior admission October, 2016 for left flank pain. Recurrence of left flank pain at 6 a.m. this  morning. No known injury. Subsequent encounter. EXAM: CT PELVIS WITHOUT CONTRAST TECHNIQUE: Multidetector CT imaging of the pelvis was performed following the standard protocol without intravenous  contrast. COMPARISON:  CT abdomen and pelvis 01/30/2015 and 08/20/2012. FINDINGS: The patient is status post hysterectomy. There is some sigmoid diverticulosis without diverticulitis. Imaged bowel loops are otherwise normal in appearance. There is no lymphadenopathy or fluid. The urinary bladder appears normal. Aortoiliac atherosclerosis without aneurysm is seen. Coils are noted in the retroperitoneum anterior to the sacrum. Small focus of calcification in the anterior wall of the pelvis in a right paracentral position may be due to old trauma or surgery. No acute bony or joint abnormality is identified. The patient has a healed right intertrochanteric fracture with fixation hardware in place. A small amount of heterotopic ossification about the fracture is unchanged. Degenerative disc disease and facet arthropathy are seen at L4-5 and L5-S1 IMPRESSION: No acute abnormality. Sigmoid diverticulosis without diverticulitis. Atherosclerosis. Lower lumbar spondylosis. Status post fixation of a remote healed right intertrochanteric fracture. Electronically Signed   By: Inge Rise M.D.   On: 02/27/2015 10:38   US Soft Tissue Head/neck  03/09/2015  CLINICAL DATA:  Hyperthyroidism. EXAM: THYROID ULTRASOUND TECHNIQUE: Ultrasound examination of the thyroid gland and adjacent soft tissues was performed. COMPARISON:  None. FINDINGS: Right thyroid lobe Measurements: 4.0 x 2.4 x 1.9 cm. Multiple heterogeneous nodules in the right thyroid lobe. Nodule in the superior right thyroid lobe measures up to 0.7 cm. There is a heterogeneous nodule in the mid right thyroid lobe with multiple echogenic foci which could represent inspissated colloid. This nodule measures up to 1.2 cm. Largest right thyroid nodule is in the mid/inferior aspect and measures 2.1 x 2.0 x 1.8 cm. This nodule has a few echogenic foci which could represent inspissated colloid. Small heterogeneous nodule in the inferior right thyroid lobe measuring up to  0.8 cm. Left thyroid lobe Measurements: 4.3 x 1.5 x 1.7 cm. Heterogeneous nodule in the mid left thyroid lobe measures up to 1.1 cm. Hypoechoic nodule along the inferior left thyroid lobe measures 1.0 cm. Isthmus Thickness: 0.6 cm. Along the left side of the isthmus, there is a small heterogeneous nodule measuring up to 0.7 cm. Along the right side of the isthmus, there is a solid nodule measuring 1.9 x 1.8 x 1.4 cm. Lymphadenopathy None visualized. IMPRESSION: Multi nodular goiter. The dominant right thyroid nodule and the dominant isthmus nodule both meet criteria for ultrasound-guided biopsy. This recommendation follows the consensus statement: Management of Thyroid Nodules Detected at [place]: Society of Radiologists in [place]. Radiology 2005; [id number]. Electronically Signed   By: Markus Daft M.D.   On: 03/09/2015 17:18   Ct Hip Left Wo Contrast  02/27/2015  CLINICAL DATA:  Left hip pain.  Fell several weeks ago. EXAM: CT OF THE LEFT HIP WITHOUT CONTRAST TECHNIQUE: Multidetector CT imaging of the left hip was performed according to the standard protocol. Multiplanar CT image reconstructions were also generated. COMPARISON:  CT pelvis, same date.  Prior CT scan 01/30/2015 FINDINGS: The left hip is normally located. Mild to moderate degenerative changes with joint space narrowing an osteophytic spurring. No fracture or avascular necrosis. The visualized left hemipelvis is intact. No definite fractures. The pubic symphysis and left SI joint are intact. No significant intrapelvic abnormalities are demonstrated. No inguinal mass or adenopathy. IMPRESSION: No acute bony findings. Mild to moderate left hip joint degenerative changes. Electronically Signed   By: Marijo Sanes M.D.   On:  02/27/2015 11:24    Microbiology: No results found for this or any previous visit (from the past 240 hour(s)).   Labs: Basic Metabolic Panel:  Recent Labs Lab 03/08/15 1610 03/08/15 2132  03/09/15 0241 03/10/15 0513 03/11/15 0351  NA 117*  --  122* 126* 132*  K 3.2*  --  3.9 3.6 3.7  CL 80*  --  89* 91* 98*  CO2 28  --  23 28 28   GLUCOSE 131*  --  106* 133* 127*  BUN 11  --  13 19 17   CREATININE 1.12*  --  1.24* 1.64* 1.25*  CALCIUM 8.2*  --  7.9* 8.3* 8.3*  MG  --  1.5*  --  1.9  --    Liver Function Tests:  Recent Labs Lab 03/08/15 1610  AST 20  ALT 13*  ALKPHOS 50  BILITOT 0.5  PROT 5.6*  ALBUMIN 3.2*   No results for input(s): LIPASE, AMYLASE in the last 168 hours. No results for input(s): AMMONIA in the last 168 hours. CBC:  Recent Labs Lab 03/08/15 1610 03/09/15 0241 03/10/15 0513 03/11/15 0351  WBC 6.2 6.0 7.1 6.2  NEUTROABS 4.5  --   --   --   HGB 11.3* 10.5* 11.0* 9.4*  HCT 31.5* 29.2* 30.4* 26.9*  MCV 79.5 79.3 81.7 83.0  PLT 171 161 161 159   Cardiac Enzymes:  Recent Labs Lab 03/08/15 2132 03/09/15 0241 03/09/15 0835  TROPONINI <0.03 <0.03 <0.03   BNP: BNP (last 3 results) No results for input(s): BNP in the last 8760 hours.  ProBNP (last 3 results) No results for input(s): PROBNP in the last 8760 hours.  CBG:  Recent Labs Lab 03/10/15 0717  GLUCAP 129*       Signed:  Kelvin Cellar  [facility] 03/11/2015, 10:55 AM

## 2015-03-11 NOTE — Care Management Note (Signed)
Case Management Note  Patient Details  Name: [name] MRN: [id number] Date of Birth: 1931/09/13  Subjective/Objective:                    Action/Plan:   Expected Discharge Date:     03/11/15             Expected Discharge Plan:  [place] Resumption  In-House Referral:     Discharge planning Services  CM Consult  Post Acute Care Choice:  Home Health Choice offered to:  Patient, Spouse  DME Arranged:  N/A DME Agency:  NA  HH Arranged:  PT [place] Agency:  [place]  Status of Service:  Completed, signed off  Medicare Important Message Given:  Yes Date Medicare IM Given:    Medicare IM give by:    Date Additional Medicare IM Given:    Additional Medicare Important Message give by:     If discussed at [place] of Stay Meetings, dates discussed:    Additional CommentsLaurena Slimmer, RN 03/11/2015, 11:41 AM

## 2015-03-11 NOTE — Progress Notes (Signed)
Discharge instructions and medications discussed with patient.  Prescriptions given to patient.  All questions answered.  

## 2015-03-11 NOTE — Care Management Important Message (Signed)
Important Message  Patient Details  Name: [name] MRN: [id number] Date of Birth: March 11, 1932   Medicare Important Message Given:  Ernesta Amble, RN 03/11/2015, 12:33 PM

## 2015-03-16 ENCOUNTER — Other Ambulatory Visit: Payer: Self-pay | Admitting: Neurosurgery

## 2015-03-19 ENCOUNTER — Encounter (HOSPITAL_COMMUNITY): Payer: Self-pay | Admitting: Emergency Medicine

## 2015-03-19 ENCOUNTER — Encounter (HOSPITAL_COMMUNITY): Payer: Self-pay | Admitting: *Deleted

## 2015-03-19 MED ORDER — CEFAZOLIN SODIUM-DEXTROSE 2-3 GM-% IV SOLR: 2.0000 g | INTRAVENOUS | Status: DC

## 2015-03-19 NOTE — Progress Notes (Signed)
Anesthesia Chart Review:  Pt is 81 year old female scheduled for T12 kyphoplasty on 03/20/2015 with Dr. Sherwood Gambler.   Pt is a same day work up.   Cardiologist is Dr. Daneen Schick, last office visit 11/12/14.   PMH includes:  CAD (MI 2007; chronic RCA total occlusion), HTN, renal artery stenosis, heart murmur, DM, seizures, Meniere's disease, GERD. Former smoker. BMI 21.5.   Hospitalized 11/24-11/27/16 for hyponatremia, syncope, hypokalemia, new diagnosis hyperthyroidism, thyroid nodule.   Medications include: ASA, carvedilol, clonidine, nexium, tapazole, olmesartan, simvastatin.   Most recent labs from 03/11/15 show NA 132 and normal K.    Chest x-ray 02/26/15 reviewed. New compression fracture of the inferior aspect of T12 since 01/30/2015. Heart and lungs appear normal. Aortic atherosclerosis.  EKG 03/08/15: Sinus rhythm. Probable inferior infarct, old. Probable lateral infarct, old  Echo 03/09/15:  - Left ventricle: The  cavity size was normal. Wall thickness wasincreased in a pattern of mild LVH. Systolic function was normal.The estimated ejection fraction was in the range of 60% to 65%.Wall motion was normal; there were no regional wall motionabnormalities. Doppler parameters are consistent with abnormalleft ventricular relaxation (grade 1 diastolic dysfunction).Doppler parameters are consistent with high ventricular fillingpressure. - Aortic valve: There was very mild stenosis. There was trivial regurgitation. Valve area (VTI): 0.97 cm^2. Valve area (Vmax): 0.9 cm^2. Valve area (Vmean): 0.89 cm^2. - Mitral valve: Calcified annulus. Valve area by pressure half-time: 2.47 cm^2. Valve area by continuity equation (using LVOT flow): 1.16 cm^2.  Renal angiography 12/20/14: Occluded left renal artery stent with no significant disease affecting the right renal artery. Recommendations: The timing of the left renal artery stent occlusion is unknown and likely chronic and thus I doubt that there  would be much benefit at this point from revascularization.   Cardiac cath 07/30/06:  1. Significant coronary artery disease with total occlusion of the right coronary in the mid segment with brisk and widely patent left-to-right collaterals. 2. Moderate left anterior descending disease with 50% to 60% mid left anterior descending stenosis. The vessel is tortuous. The circumflex is widely patent. 3. Left renal artery: Sixty percent ostial narrowing. 4. Overall normal left ventricular function.  Reviewed case with Dr. Deatra Canter. Pt will need endocrinology to ok pt for surgery, and pt will need cardiac clearance for surgery given CAD with no known testing since 2008.   Willeen Cass, FNP-BC [facility]/Anesthesiology Phone: [phone] 03/19/2015 4:12 PM

## 2015-03-20 ENCOUNTER — Ambulatory Visit (HOSPITAL_COMMUNITY): Payer: Medicare Other | Admitting: Anesthesiology

## 2015-03-20 ENCOUNTER — Encounter (HOSPITAL_COMMUNITY)
Admission: RE | Disposition: A | Discharge status: Home / Self Care | Payer: Self-pay | Source: Ambulatory Visit | Attending: Neurosurgery

## 2015-03-20 ENCOUNTER — Encounter (HOSPITAL_COMMUNITY): Payer: Self-pay | Admitting: *Deleted

## 2015-03-20 ENCOUNTER — Observation Stay (HOSPITAL_COMMUNITY): Payer: Medicare Other

## 2015-03-20 ENCOUNTER — Encounter (HOSPITAL_COMMUNITY): Admission: RE | Payer: Self-pay | Source: Ambulatory Visit

## 2015-03-20 ENCOUNTER — Observation Stay (HOSPITAL_COMMUNITY)
Admission: RE | Admit: 2015-03-20 | Discharge: 2015-03-20 | Disposition: A | Discharge status: Home / Self Care | Payer: Medicare Other | Source: Ambulatory Visit | Attending: Neurosurgery | Admitting: Neurosurgery

## 2015-03-20 ENCOUNTER — Ambulatory Visit (HOSPITAL_COMMUNITY): Admission: RE | Admit: 2015-03-20 | Payer: Medicare Other | Source: Ambulatory Visit | Admitting: Neurosurgery

## 2015-03-20 DIAGNOSIS — I350 Nonrheumatic aortic (valve) stenosis: Secondary | ICD-10-CM | POA: Diagnosis not present

## 2015-03-20 DIAGNOSIS — E785 Hyperlipidemia, unspecified: Secondary | ICD-10-CM | POA: Insufficient documentation

## 2015-03-20 DIAGNOSIS — N189 Chronic kidney disease, unspecified: Secondary | ICD-10-CM | POA: Insufficient documentation

## 2015-03-20 DIAGNOSIS — Z7982 Long term (current) use of aspirin: Secondary | ICD-10-CM | POA: Insufficient documentation

## 2015-03-20 DIAGNOSIS — Z885 Allergy status to narcotic agent status: Secondary | ICD-10-CM | POA: Diagnosis not present

## 2015-03-20 DIAGNOSIS — K219 Gastro-esophageal reflux disease without esophagitis: Secondary | ICD-10-CM | POA: Insufficient documentation

## 2015-03-20 DIAGNOSIS — E041 Nontoxic single thyroid nodule: Secondary | ICD-10-CM | POA: Diagnosis not present

## 2015-03-20 DIAGNOSIS — E1151 Type 2 diabetes mellitus with diabetic peripheral angiopathy without gangrene: Secondary | ICD-10-CM | POA: Diagnosis not present

## 2015-03-20 DIAGNOSIS — Z888 Allergy status to other drugs, medicaments and biological substances status: Secondary | ICD-10-CM | POA: Diagnosis not present

## 2015-03-20 DIAGNOSIS — E0590 Thyrotoxicosis, unspecified without thyrotoxic crisis or storm: Secondary | ICD-10-CM | POA: Diagnosis not present

## 2015-03-20 DIAGNOSIS — I129 Hypertensive chronic kidney disease with stage 1 through stage 4 chronic kidney disease, or unspecified chronic kidney disease: Secondary | ICD-10-CM | POA: Insufficient documentation

## 2015-03-20 DIAGNOSIS — Z881 Allergy status to other antibiotic agents status: Secondary | ICD-10-CM | POA: Diagnosis not present

## 2015-03-20 DIAGNOSIS — I701 Atherosclerosis of renal artery: Secondary | ICD-10-CM | POA: Diagnosis not present

## 2015-03-20 DIAGNOSIS — I252 Old myocardial infarction: Secondary | ICD-10-CM | POA: Diagnosis not present

## 2015-03-20 DIAGNOSIS — Z79899 Other long term (current) drug therapy: Secondary | ICD-10-CM | POA: Insufficient documentation

## 2015-03-20 DIAGNOSIS — M4854XA Collapsed vertebra, not elsewhere classified, thoracic region, initial encounter for fracture: Secondary | ICD-10-CM | POA: Diagnosis present

## 2015-03-20 DIAGNOSIS — I2510 Atherosclerotic heart disease of native coronary artery without angina pectoris: Secondary | ICD-10-CM | POA: Insufficient documentation

## 2015-03-20 DIAGNOSIS — Z87891 Personal history of nicotine dependence: Secondary | ICD-10-CM | POA: Insufficient documentation

## 2015-03-20 DIAGNOSIS — Z91013 Allergy to seafood: Secondary | ICD-10-CM | POA: Insufficient documentation

## 2015-03-20 DIAGNOSIS — E1122 Type 2 diabetes mellitus with diabetic chronic kidney disease: Secondary | ICD-10-CM | POA: Insufficient documentation

## 2015-03-20 DIAGNOSIS — Z91041 Radiographic dye allergy status: Secondary | ICD-10-CM | POA: Insufficient documentation

## 2015-03-20 DIAGNOSIS — R569 Unspecified convulsions: Secondary | ICD-10-CM | POA: Diagnosis not present

## 2015-03-20 DIAGNOSIS — R011 Cardiac murmur, unspecified: Secondary | ICD-10-CM | POA: Insufficient documentation

## 2015-03-20 DIAGNOSIS — S22080A Wedge compression fracture of T11-T12 vertebra, initial encounter for closed fracture: Secondary | ICD-10-CM | POA: Diagnosis present

## 2015-03-20 DIAGNOSIS — M810 Age-related osteoporosis without current pathological fracture: Secondary | ICD-10-CM | POA: Diagnosis not present

## 2015-03-20 DIAGNOSIS — Z419 Encounter for procedure for purposes other than remedying health state, unspecified: Secondary | ICD-10-CM

## 2015-03-20 DIAGNOSIS — H8109 Meniere's disease, unspecified ear: Secondary | ICD-10-CM | POA: Insufficient documentation

## 2015-03-20 HISTORY — PX: KYPHOPLASTY: SHX5884

## 2015-03-20 LAB — GLUCOSE, CAPILLARY
Glucose-Capillary: 134 mg/dL — ABNORMAL HIGH (ref 65–99)
Glucose-Capillary: 141 mg/dL — ABNORMAL HIGH (ref 65–99)
Glucose-Capillary: 99 mg/dL (ref 65–99)

## 2015-03-20 LAB — SURGICAL PCR SCREEN
MRSA, PCR: NEGATIVE
Staphylococcus aureus: NEGATIVE

## 2015-03-20 SURGERY — KYPHOPLASTY
Anesthesia: General | Site: Back

## 2015-03-20 SURGERY — KYPHOPLASTY
Anesthesia: General

## 2015-03-20 MED ORDER — LORAZEPAM 2 MG/ML IJ SOLN
0.5000 mg | Freq: Once | INTRAMUSCULAR | Status: AC
  Administered 2015-03-20: 0.5 mg via INTRAVENOUS

## 2015-03-20 MED ORDER — ACETAMINOPHEN 160 MG/5ML PO SOLN
325.0000 mg | ORAL | Status: DC | PRN
  Filled 2015-03-20: qty 20.3

## 2015-03-20 MED ORDER — NEOSTIGMINE METHYLSULFATE 10 MG/10ML IV SOLN
INTRAVENOUS | Status: AC
  Filled 2015-03-20: qty 5

## 2015-03-20 MED ORDER — SIMVASTATIN 20 MG PO TABS: 20.0000 mg | ORAL_TABLET | ORAL | Status: DC

## 2015-03-20 MED ORDER — METHIMAZOLE 10 MG PO TABS
10.0000 mg | ORAL_TABLET | Freq: Two times a day (BID) | ORAL | Status: DC
  Filled 2015-03-20: qty 1

## 2015-03-20 MED ORDER — LIDOCAINE-EPINEPHRINE 1 %-1:100000 IJ SOLN
INTRAMUSCULAR | Status: DC | PRN
Start: 2015-03-20 — End: 2015-03-20
  Administered 2015-03-20: 30 mL

## 2015-03-20 MED ORDER — MENTHOL 3 MG MT LOZG: 1.0000 | LOZENGE | OROMUCOSAL | Status: DC | PRN

## 2015-03-20 MED ORDER — OXYCODONE-ACETAMINOPHEN 5-325 MG PO TABS
1.0000 | ORAL_TABLET | ORAL | Status: DC | PRN
  Administered 2015-03-20: 2 via ORAL

## 2015-03-20 MED ORDER — SODIUM CHLORIDE 0.9 % IJ SOLN
INTRAMUSCULAR | Status: AC
  Filled 2015-03-20: qty 10

## 2015-03-20 MED ORDER — ALUM & MAG HYDROXIDE-SIMETH 200-200-20 MG/5ML PO SUSP: 30.0000 mL | Freq: Four times a day (QID) | ORAL | Status: DC | PRN

## 2015-03-20 MED ORDER — PROPOFOL 10 MG/ML IV BOLUS
INTRAVENOUS | Status: DC | PRN
  Administered 2015-03-20 (×2): 100 mg via INTRAVENOUS

## 2015-03-20 MED ORDER — PHENYLEPHRINE HCL 10 MG/ML IJ SOLN
INTRAMUSCULAR | Status: DC | PRN
  Administered 2015-03-20 (×3): 80 ug via INTRAVENOUS

## 2015-03-20 MED ORDER — SODIUM CHLORIDE 0.9 % IJ SOLN: 3.0000 mL | INTRAMUSCULAR | Status: DC | PRN

## 2015-03-20 MED ORDER — SODIUM CHLORIDE 0.9 % IJ SOLN: 3.0000 mL | Freq: Two times a day (BID) | INTRAMUSCULAR | Status: DC

## 2015-03-20 MED ORDER — BUPIVACAINE HCL (PF) 0.25 % IJ SOLN
INTRAMUSCULAR | Status: DC | PRN
Start: 2015-03-20 — End: 2015-03-20
  Administered 2015-03-20: 30 mL

## 2015-03-20 MED ORDER — OXYCODONE HCL 5 MG PO TABS: 5.0000 mg | ORAL_TABLET | Freq: Once | ORAL | Status: DC | PRN

## 2015-03-20 MED ORDER — HYDROXYZINE HCL 25 MG PO TABS: 50.0000 mg | ORAL_TABLET | ORAL | Status: DC | PRN

## 2015-03-20 MED ORDER — CEFAZOLIN SODIUM-DEXTROSE 2-3 GM-% IV SOLR
2.0000 g | Freq: Once | INTRAVENOUS | Status: AC
  Administered 2015-03-20: 2 g via INTRAVENOUS

## 2015-03-20 MED ORDER — GLYCOPYRROLATE 0.2 MG/ML IJ SOLN
INTRAMUSCULAR | Status: DC | PRN
  Administered 2015-03-20: .5 mg via INTRAVENOUS

## 2015-03-20 MED ORDER — DIPHENHYDRAMINE HCL 50 MG/ML IJ SOLN
INTRAMUSCULAR | Status: AC
  Filled 2015-03-20: qty 1

## 2015-03-20 MED ORDER — DICYCLOMINE HCL 20 MG PO TABS
20.0000 mg | ORAL_TABLET | Freq: Two times a day (BID) | ORAL | Status: DC
  Filled 2015-03-20 (×2): qty 1

## 2015-03-20 MED ORDER — DIPHENHYDRAMINE HCL 50 MG/ML IJ SOLN
INTRAMUSCULAR | Status: DC | PRN
  Administered 2015-03-20: 25 mg via INTRAVENOUS

## 2015-03-20 MED ORDER — NITROGLYCERIN 0.4 MG SL SUBL: 0.4000 mg | SUBLINGUAL_TABLET | SUBLINGUAL | Status: DC | PRN

## 2015-03-20 MED ORDER — ROCURONIUM BROMIDE 50 MG/5ML IV SOLN
INTRAVENOUS | Status: AC
  Filled 2015-03-20: qty 1

## 2015-03-20 MED ORDER — PROPOFOL 10 MG/ML IV BOLUS
INTRAVENOUS | Status: AC
  Filled 2015-03-20: qty 20

## 2015-03-20 MED ORDER — LIDOCAINE HCL (CARDIAC) 20 MG/ML IV SOLN
INTRAVENOUS | Status: AC
  Filled 2015-03-20: qty 5

## 2015-03-20 MED ORDER — MUPIROCIN 2 % EX OINT
1.0000 "application " | TOPICAL_OINTMENT | Freq: Once | CUTANEOUS | Status: AC
  Administered 2015-03-20: 1 via TOPICAL

## 2015-03-20 MED ORDER — HYDROMORPHONE HCL 1 MG/ML IJ SOLN
INTRAMUSCULAR | Status: AC
  Filled 2015-03-20: qty 1

## 2015-03-20 MED ORDER — CARVEDILOL 25 MG PO TABS
25.0000 mg | ORAL_TABLET | Freq: Two times a day (BID) | ORAL | Status: DC
  Filled 2015-03-20 (×2): qty 1

## 2015-03-20 MED ORDER — SUCCINYLCHOLINE CHLORIDE 20 MG/ML IJ SOLN
INTRAMUSCULAR | Status: AC
  Filled 2015-03-20: qty 1

## 2015-03-20 MED ORDER — MAGNESIUM HYDROXIDE 400 MG/5ML PO SUSP: 30.0000 mL | Freq: Every day | ORAL | Status: DC | PRN

## 2015-03-20 MED ORDER — HYDROXYZINE HCL 50 MG/ML IM SOLN
50.0000 mg | INTRAMUSCULAR | Status: DC | PRN
  Filled 2015-03-20: qty 1

## 2015-03-20 MED ORDER — LABETALOL HCL 5 MG/ML IV SOLN
5.0000 mg | INTRAVENOUS | Status: DC | PRN
  Administered 2015-03-20: 5 mg via INTRAVENOUS

## 2015-03-20 MED ORDER — POTASSIUM CHLORIDE IN NACL 40-0.9 MEQ/L-% IV SOLN
INTRAVENOUS | Status: DC
  Filled 2015-03-20 (×2): qty 1000

## 2015-03-20 MED ORDER — ACETAMINOPHEN 10 MG/ML IV SOLN
INTRAVENOUS | Status: AC
  Administered 2015-03-20: 1000 mg via INTRAVENOUS
  Filled 2015-03-20: qty 100

## 2015-03-20 MED ORDER — CEFAZOLIN SODIUM-DEXTROSE 2-3 GM-% IV SOLR
INTRAVENOUS | Status: AC
  Filled 2015-03-20: qty 50

## 2015-03-20 MED ORDER — ONDANSETRON HCL 4 MG PO TABS: 4.0000 mg | ORAL_TABLET | Freq: Four times a day (QID) | ORAL | Status: DC | PRN

## 2015-03-20 MED ORDER — OXYCODONE HCL 5 MG/5ML PO SOLN: 5.0000 mg | Freq: Once | ORAL | Status: DC | PRN

## 2015-03-20 MED ORDER — CLONIDINE HCL 0.1 MG PO TABS
0.1000 mg | ORAL_TABLET | Freq: Three times a day (TID) | ORAL | Status: DC
  Filled 2015-03-20 (×2): qty 1

## 2015-03-20 MED ORDER — PHENOL 1.4 % MT LIQD: 1.0000 | OROMUCOSAL | Status: DC | PRN

## 2015-03-20 MED ORDER — ROCURONIUM BROMIDE 100 MG/10ML IV SOLN
INTRAVENOUS | Status: DC | PRN
  Administered 2015-03-20: 20 mg via INTRAVENOUS

## 2015-03-20 MED ORDER — ONDANSETRON HCL 4 MG/2ML IJ SOLN
INTRAMUSCULAR | Status: DC | PRN
  Administered 2015-03-20: 4 mg via INTRAVENOUS

## 2015-03-20 MED ORDER — FENTANYL CITRATE (PF) 250 MCG/5ML IJ SOLN
INTRAMUSCULAR | Status: AC
  Filled 2015-03-20: qty 5

## 2015-03-20 MED ORDER — ACETAMINOPHEN 325 MG PO TABS: 325.0000 mg | ORAL_TABLET | ORAL | Status: DC | PRN

## 2015-03-20 MED ORDER — PROMETHAZINE HCL 12.5 MG PO TABS
12.5000 mg | ORAL_TABLET | Freq: Four times a day (QID) | ORAL | Status: DC | PRN
  Filled 2015-03-20: qty 1

## 2015-03-20 MED ORDER — PHENYLEPHRINE 40 MCG/ML (10ML) SYRINGE FOR IV PUSH (FOR BLOOD PRESSURE SUPPORT)
PREFILLED_SYRINGE | INTRAVENOUS | Status: AC
  Filled 2015-03-20: qty 10

## 2015-03-20 MED ORDER — ONDANSETRON HCL 4 MG/2ML IJ SOLN: 4.0000 mg | Freq: Four times a day (QID) | INTRAMUSCULAR | Status: DC | PRN

## 2015-03-20 MED ORDER — NEOSTIGMINE METHYLSULFATE 10 MG/10ML IV SOLN
INTRAVENOUS | Status: DC | PRN
  Administered 2015-03-20: 3 mg via INTRAVENOUS

## 2015-03-20 MED ORDER — LACTATED RINGERS IV SOLN
INTRAVENOUS | Status: DC | PRN
  Administered 2015-03-20: 08:00:00 via INTRAVENOUS

## 2015-03-20 MED ORDER — LIDOCAINE HCL 4 % MT SOLN
OROMUCOSAL | Status: DC | PRN
  Administered 2015-03-20: 4 mL via TOPICAL

## 2015-03-20 MED ORDER — LIDOCAINE HCL (CARDIAC) 20 MG/ML IV SOLN
INTRAVENOUS | Status: DC | PRN
  Administered 2015-03-20: 80 mg via INTRAVENOUS

## 2015-03-20 MED ORDER — MEPERIDINE HCL 100 MG/ML IJ SOLN: 75.0000 mg | INTRAMUSCULAR | Status: DC | PRN

## 2015-03-20 MED ORDER — BISACODYL 10 MG RE SUPP: 10.0000 mg | Freq: Every day | RECTAL | Status: DC | PRN

## 2015-03-20 MED ORDER — ACETAMINOPHEN 325 MG PO TABS: 650.0000 mg | ORAL_TABLET | ORAL | Status: DC | PRN

## 2015-03-20 MED ORDER — 0.9 % SODIUM CHLORIDE (POUR BTL) OPTIME
TOPICAL | Status: DC | PRN
Start: 2015-03-20 — End: 2015-03-20
  Administered 2015-03-20: 1000 mL

## 2015-03-20 MED ORDER — ACETAMINOPHEN 10 MG/ML IV SOLN: 1000.0000 mg | Freq: Once | INTRAVENOUS | Status: DC

## 2015-03-20 MED ORDER — ACETAMINOPHEN 650 MG RE SUPP: 650.0000 mg | RECTAL | Status: DC | PRN

## 2015-03-20 SURGICAL SUPPLY — 43 items
BANDAGE ADH SHEER 1  50/CT (GAUZE/BANDAGES/DRESSINGS) ×2 IMPLANT
BLADE CLIPPER SURG (BLADE) IMPLANT
BLADE SURG 11 STRL SS (BLADE) ×2 IMPLANT
CEMENT BONE KYPHX HV R (Orthopedic Implant) ×2 IMPLANT
CEMENT KYPHON C01A KIT/MIXER (Cement) ×2 IMPLANT
DECANTER SPIKE VIAL GLASS SM (MISCELLANEOUS) IMPLANT
DERMABOND ADVANCED (GAUZE/BANDAGES/DRESSINGS) ×1
DERMABOND ADVANCED .7 DNX12 (GAUZE/BANDAGES/DRESSINGS) ×1 IMPLANT
DRAPE C-ARM 42X72 X-RAY (DRAPES) ×2 IMPLANT
DRAPE INCISE IOBAN 66X45 STRL (DRAPES) ×4 IMPLANT
DRAPE LAPAROTOMY 100X72X124 (DRAPES) ×2 IMPLANT
DRAPE PROXIMA HALF (DRAPES) ×2 IMPLANT
DURAPREP 26ML APPLICATOR (WOUND CARE) ×2 IMPLANT
GAUZE SPONGE 4X4 16PLY XRAY LF (GAUZE/BANDAGES/DRESSINGS) ×2 IMPLANT
GLOVE BIOGEL PI IND STRL 6.5 (GLOVE) ×2 IMPLANT
GLOVE BIOGEL PI IND STRL 8 (GLOVE) ×1 IMPLANT
GLOVE BIOGEL PI INDICATOR 6.5 (GLOVE) ×2
GLOVE BIOGEL PI INDICATOR 8 (GLOVE) ×1
GLOVE ECLIPSE 7.5 STRL STRAW (GLOVE) ×2 IMPLANT
GLOVE EXAM NITRILE LRG STRL (GLOVE) IMPLANT
GLOVE EXAM NITRILE MD LF STRL (GLOVE) IMPLANT
GLOVE EXAM NITRILE XL STR (GLOVE) IMPLANT
GLOVE EXAM NITRILE XS STR PU (GLOVE) IMPLANT
GLOVE SURG SS PI 6.5 STRL IVOR (GLOVE) ×4 IMPLANT
GOWN STRL REUS W/ TWL LRG LVL3 (GOWN DISPOSABLE) ×1 IMPLANT
GOWN STRL REUS W/ TWL XL LVL3 (GOWN DISPOSABLE) ×2 IMPLANT
GOWN STRL REUS W/TWL 2XL LVL3 (GOWN DISPOSABLE) IMPLANT
GOWN STRL REUS W/TWL LRG LVL3 (GOWN DISPOSABLE) ×1
GOWN STRL REUS W/TWL XL LVL3 (GOWN DISPOSABLE) ×2
KIT BASIN OR (CUSTOM PROCEDURE TRAY) ×2 IMPLANT
KIT ROOM TURNOVER OR (KITS) ×2 IMPLANT
NEEDLE HYPO 25X1 1.5 SAFETY (NEEDLE) ×2 IMPLANT
NS IRRIG 1000ML POUR BTL (IV SOLUTION) ×2 IMPLANT
PACK SURGICAL SETUP 50X90 (CUSTOM PROCEDURE TRAY) ×2 IMPLANT
PAD ARMBOARD 7.5X6 YLW CONV (MISCELLANEOUS) ×6 IMPLANT
SPECIMEN JAR SMALL (MISCELLANEOUS) IMPLANT
SUT VIC AB 3-0 SH 8-18 (SUTURE) ×2 IMPLANT
SUT VIC AB 4-0 P-3 18X BRD (SUTURE) ×1 IMPLANT
SUT VIC AB 4-0 P3 18 (SUTURE) ×1
SYR CONTROL 10ML LL (SYRINGE) ×2 IMPLANT
TOWEL OR 17X24 6PK STRL BLUE (TOWEL DISPOSABLE) ×2 IMPLANT
TOWEL OR 17X26 10 PK STRL BLUE (TOWEL DISPOSABLE) ×2 IMPLANT
TRAY KYPHOPAK 20/3 ONESTEP 1ST (MISCELLANEOUS) ×2 IMPLANT

## 2015-03-20 NOTE — H&P (Signed)
Subjective: Patient is a 81 y.o. right-handed white female who is admitted for treatment of T12 compression fracture.  Patient was initially managed in a TLSO, however she found that it actually aggravated the pain. She has been having pain across her back. She is admitted now for T12 kyphoplasty.    Patient Active Problem List   Diagnosis Date Noted  . Hyperthyroidism 03/11/2015  . Thyroid nodule 03/11/2015  . Syncope 03/08/2015  . Hypokalemia 03/08/2015  . Weakness 02/26/2015  . Compression fracture of T12 vertebra ([place]) 02/26/2015  . Chronic kidney disease 02/26/2015  . Multiple allergies 02/26/2015  . Abdominal pain 02/04/2015  . Hyponatremia 02/04/2015  . Left flank pain 02/03/2015  . LLQ abdominal pain 08/21/2012  . Indigestion 12/21/2011  . Renal artery stenosis ([place]) 12/21/2011  . DM (diabetes mellitus) ([place]) 12/21/2011  . CAD (coronary artery disease) 12/21/2011  . HTN (hypertension) 12/21/2011  . Hyperlipidemia 12/21/2011  . GERD (gastroesophageal reflux disease) 12/21/2011   Past Medical History  Diagnosis Date  . CAD (coronary artery disease)   . Renal artery stenosis ([place])   . HTN (hypertension)   . Dyslipidemia   . GERD (gastroesophageal reflux disease)   . Osteoporosis   . Meniere disease     "for 20 years" takes Antivert 3 times per day  . Complication of anesthesia     "almost died when I had my breast reduction"  . Heart murmur   . Myocardial infarction ([place]) 2007  . Type II diabetes mellitus ([place])     "controlled w/diet and exercise only" (02/26/2015)  . History of hiatal hernia   . Seizures [facility])     Past Surgical History  Procedure Laterality Date  . Hip fracture surgery Right 2011    "added rod in my leg and a ball"  . Wrist fracture surgery Left 2012  . Cholecystectomy    . Bladder suspension  x3  . Anterior cervical decomp/discectomy fusion  02/2002    Archie Endo 08/26/2010  . Reduction mammaplasty Bilateral 1985  . Renal artery stent Left  2012  . Peripheral vascular catheterization Left 12/20/2014    Procedure: Renal Angiography;  Surgeon: Wellington Hampshire, MD;  Location: [facility];  Service: Cardiovascular;  Laterality: Left;  . Vaginal hysterectomy  ~ 1995  . Fracture surgery    . Back surgery    . Breast lumpectomy  11/2007    breast needle-localized lumpectomy/notes 08/12/2012  . Neck hardware removal  11/2002    Archie Endo 08/26/2010  . Cataract extraction w/ intraocular lens  implant, bilateral Bilateral   . Cardiac catheterization  2007    "couldn't get stents in; I almost died'    Prescriptions prior to admission  Medication Sig Dispense Refill Last Dose  . aspirin 325 MG tablet Take 325 mg by mouth at bedtime.   03/07/2015 at Unknown time  . betamethasone dipropionate (DIPROLENE) 0.05 % cream Apply 1 application topically daily as needed (irritated skin).   Past Month at Unknown time  . Calcium Citrate (CITRACAL PO) Take 1,200 mg by mouth daily.   03/08/2015 at Unknown time  . carvedilol (COREG) 25 MG tablet Take 25 mg by mouth 2 (two) times daily with a meal.   03/08/2015 at 0800  . cholecalciferol (VITAMIN D) 1000 UNITS tablet Take 2,000 Units by mouth daily.    03/08/2015 at Unknown time  . cloNIDine (CATAPRES) 0.1 MG tablet Take 1 tablet (0.1 mg total) by mouth 3 (three) times daily. 60 tablet 11   .  Cod Liver Oil CAPS Take 1 capsule by mouth daily.   03/08/2015 at Unknown time  . cyclobenzaprine (FLEXERIL) 5 MG tablet Take 1 tablet (5 mg total) by mouth 3 (three) times daily as needed for muscle spasms. 20 tablet 0 Past Month at Unknown time  . denosumab (PROLIA) 60 MG/ML SOLN injection Inject 60 mg into the skin every 6 (six) months. Administer in upper arm, thigh, or abdomen   unknown  . diclofenac sodium (VOLTAREN) 1 % GEL Apply 4 g topically 4 (four) times daily as needed (pain). 1 Tube 0 Past Month at Unknown time  . dicyclomine (BENTYL) 20 MG tablet Take 1 tablet (20 mg total) by mouth 2 (two) times daily.  20 tablet 0 03/08/2015 at Unknown time  . docusate sodium (COLACE) 100 MG capsule Take 2 capsules (200 mg total) by mouth 2 (two) times daily. 20 capsule 0 03/08/2015 at Unknown time  . esomeprazole (NEXIUM) 40 MG capsule Take 40 mg by mouth 2 (two) times daily.   03/08/2015 at Unknown time  . fexofenadine (ALLEGRA) 180 MG tablet Take 180 mg by mouth daily.   03/08/2015 at Unknown time  . meclizine (ANTIVERT) 25 MG tablet Take 25 mg by mouth 3 (three) times daily.    03/08/2015 at Unknown time  . methimazole (TAPAZOLE) 10 MG tablet Take 1 tablet (10 mg total) by mouth 2 (two) times daily. 60 tablet 1   . Multiple Vitamins-Minerals (PRESERVISION AREDS) CAPS Take 1 capsule by mouth 2 (two) times daily.    03/08/2015 at Unknown time  . nitroGLYCERIN (NITROSTAT) 0.4 MG SL tablet Place 0.4 mg under the tongue every 5 (five) minutes as needed for chest pain (3 doses MAX).   not used  . olmesartan (BENICAR) 40 MG tablet Take 20 mg by mouth daily.    03/08/2015 at Unknown time  . PREMARIN vaginal cream Place 1-2 g vaginally 2 (two) times a week.  4 Past Week at Unknown time  . promethazine (PHENERGAN) 25 MG tablet Take 0.5 tablets (12.5 mg total) by mouth every 6 (six) hours as needed for nausea or vomiting. 12 tablet 0 Past Month at Unknown time  . simvastatin (ZOCOR) 20 MG tablet Take 20 mg by mouth every Monday, Wednesday, and Friday.    03/07/2015 at Unknown time  . VITAMIN E PO Take 1 capsule by mouth daily.   03/08/2015 at Unknown time   Allergies  Allergen Reactions  . Fentanyl Anaphylaxis    Cardiac arrest and coded  . Morphine And Related Anaphylaxis    Cardiac arrest and coded Per Dr. Conley Canal patient has taken percocet without issue  . Singulair [Montelukast] Palpitations and Other (See Comments)    Increased BP and HR  . Actonel [Risedronate Sodium] Rash and Other (See Comments)    weakness  . Dilaudid [Hydromorphone Hcl] Rash and Other (See Comments)    Crying and screaming  . Fosamax  [Alendronate Sodium] Rash and Other (See Comments)    Weakness and myalgias  . Hydralazine Diarrhea and Other (See Comments)    Swelling in stomach  . Lipitor [Atorvastatin] Swelling and Rash    Tongue swelling   . Lisinopril Diarrhea, Rash and Cough  . Micardis [Telmisartan] Diarrhea and Rash  . Septra [Sulfamethoxazole-Trimethoprim] Nausea And Vomiting and Rash  . Tekturna [Aliskiren] Swelling and Rash    Swelling of tongue  . Tricor [Fenofibrate] Rash and Other (See Comments)    Flu symptoms  . Actamin [Acetaminophen] Rash  . Amlodipine Diarrhea  .  Avapro [Irbesartan] Rash  . Cardio Complete [Nutritional Supplements] Rash  . Cardizem [Diltiazem Hcl] Rash  . Ciprofloxacin Rash and Other (See Comments)    Black spot on body  . Codeine Rash  . Cyclobenzaprine Nausea Only  . Forteo [Teriparatide (Recombinant)] Rash  . Inapsine [Droperidol] Rash  . Ivp Dye [Iodinated Diagnostic Agents] Rash  . Shellfish Allergy Rash  . Tussionex Pennkinetic Er [Hydrocod Polst-Cpm Polst Er] Itching and Rash    Social History  Substance Use Topics  . Smoking status: Former Smoker -- 1.00 packs/day for 30 years    Types: Cigarettes  . Smokeless tobacco: Never Used     Comment: "quit smoking in 1989"  . Alcohol Use: No    Family History  Problem Relation Age of Onset  . Diabetes type II Mother   . Heart attack Mother   . Heart disease Mother   . Congestive Heart Failure Father   . Pulmonary embolism Father   . Alzheimer's disease Sister   . Heart disease Sister   . Heart disease Brother   . Healthy Sister   . Healthy Sister   . Stroke Brother   . Dementia Brother     VASCULAR  . Stroke Brother   . Diabetes type II Brother   . Other Brother     KILLED IN PLANE CRASH  . Pulmonary disease Sister   . Heart attack Sister   . Kidney failure Sister   . Diabetes type II Sister   . Diabetes type II Sister   . Kidney failure Sister      Review of Systems A comprehensive review of  systems was negative.  Objective: Vital signs in last 24 hours:    EXAM: Patient is a well-developed well-nourished white female in no acute distress. Lungs are clear to auscultation , the patient has symmetrical respiratory excursion. Heart has a regular rate and rhythm normal S1 and S2 no murmur.   Abdomen is soft nontender nondistended bowel sounds are present. Extremity examination shows no clubbing cyanosis or edema. Motor examination shows 5 over 5 strength in the lower extremities including the iliopsoas quadriceps dorsiflexor extensor hallicus  longus and plantar flexor bilaterally. Sensation is intact to pinprick in the distal lower extremities. Reflexes are symmetrical bilaterally. No pathologic reflexes are present. Patient has a normal gait and stance.   Data Review:CBC    Component Value Date/Time   WBC 6.2 03/11/2015 0351   RBC 3.24* 03/11/2015 0351   HGB 9.4* 03/11/2015 0351   HCT 26.9* 03/11/2015 0351   PLT 159 03/11/2015 0351   MCV 83.0 03/11/2015 0351   MCH 29.0 03/11/2015 0351   MCHC 34.9 03/11/2015 0351   RDW 13.4 03/11/2015 0351   LYMPHSABS 1.3 03/08/2015 1610   MONOABS 0.4 03/08/2015 1610   EOSABS 0.0 03/08/2015 1610   BASOSABS 0.0 03/08/2015 1610                          BMET    Component Value Date/Time   NA 132* 03/11/2015 0351   K 3.7 03/11/2015 0351   CL 98* 03/11/2015 0351   CO2 28 03/11/2015 0351   GLUCOSE 127* 03/11/2015 0351   BUN 17 03/11/2015 0351   CREATININE 1.25* 03/11/2015 0351   CALCIUM 8.3* 03/11/2015 0351   GFRNONAA 39* 03/11/2015 0351   GFRAA 45* 03/11/2015 0351     Assessment/Plan: Patient with T12 kyphoplasty, with persistent pain despite rest and bracing who is admitted  now for T12 kyphoplasty.  I've discussed with the patient the nature of his condition, the nature the surgical procedure, the typical length of surgery, hospital stay, and overall recuperation. We discussed limitations postoperatively. I discussed risks of  surgery including risks of infection, bleeding, possibly need for transfusion, the risk of nerve root dysfunction with pain, weakness, numbness, or paresthesias, or risk of dural tear and CSF leakage and possible need for further surgery, and the risk of anesthetic complications including myocardial infarction, stroke, pneumonia, and death. Understanding all this the patient does wish to proceed with surgery and is admitted for such.    Hosie Spangle, MD 03/20/2015 7:13 AM

## 2015-03-20 NOTE — Anesthesia Procedure Notes (Signed)
Procedure Name: Intubation Date/Time: 03/20/2015 7:58 AM Performed by: Mariea Clonts Pre-anesthesia Checklist: Emergency Drugs available, Patient identified, Timeout performed, Suction available and Patient being monitored Patient Re-evaluated:Patient Re-evaluated prior to inductionOxygen Delivery Method: Circle system utilized Preoxygenation: Pre-oxygenation with 100% oxygen Intubation Type: IV induction and Cricoid Pressure applied Ventilation: Mask ventilation without difficulty Laryngoscope Size: Miller and 2 Grade View: Grade II Tube type: Oral Tube size: 6.5 mm Number of attempts: 1 Placement Confirmation: ETT inserted through vocal cords under direct vision,  breath sounds checked- equal and bilateral and positive ETCO2 Tube secured with: Tape Dental Injury: Teeth and Oropharynx as per pre-operative assessment

## 2015-03-20 NOTE — Progress Notes (Signed)
Spoke with Dr Rita Ohara regarding pts pain and now asleep

## 2015-03-20 NOTE — Discharge Summary (Signed)
Physician Discharge Summary  Patient ID: [name] MRN: [id number] DOB/AGE: Jan 13, 1932 81 y.o.  Admit date: 03/20/2015 Discharge date: 03/20/2015  Admission Diagnoses:  T12 compression fracture  Discharge Diagnoses:   T12 compression fracture Active Problems:   T12 compression fracture [facility])   Discharged Condition: good  Hospital Course: patient was admitted underwent a T12 kyphoplasty, earlier today.  She is up and angling the halls.  She has had significant relief of her pain.  She is asking to be discharged to home.  She has been given instructions regarding wound care and activities following discharge by the staff.  She has been given a prescription for Percocet 5/325, to be taken 1-2 tablets by mouth every 4-6 hours when necessary pain, 75 tablets prescribed, no refills.  She is scheduled follow up with me in 3 weeks.  She is to let me know if she needs anything else between now and then.  Discharge Exam: Blood pressure 177/50, pulse 64, temperature 97.6 F (36.4 C), temperature source Oral, resp. rate 18, height 5\' 1"  (1.549 m), weight 51.665 kg (113 lb 14.4 oz), SpO2 94 %.  Disposition: 06-Home-Health Care Svc     Medication List    TAKE these medications        aspirin 325 MG tablet  Take 325 mg by mouth at bedtime.     betamethasone dipropionate 0.05 % cream  Commonly known as:  DIPROLENE  Apply 1 application topically daily as needed (irritated skin).     carvedilol 25 MG tablet  Commonly known as:  COREG  Take 25 mg by mouth 2 (two) times daily with a meal.     cholecalciferol 1000 UNITS tablet  Commonly known as:  VITAMIN D  Take 2,000 Units by mouth daily.     CITRACAL PO  Take 1,200 mg by mouth daily.     cloNIDine 0.1 MG tablet  Commonly known as:  CATAPRES  Take 1 tablet (0.1 mg total) by mouth 3 (three) times daily.     Cod Liver Oil Caps  Take 1 capsule by mouth daily.     cyclobenzaprine 5 MG tablet  Commonly known as:  FLEXERIL  Take 1  tablet (5 mg total) by mouth 3 (three) times daily as needed for muscle spasms.     denosumab 60 MG/ML Soln injection  Commonly known as:  PROLIA  Inject 60 mg into the skin every 6 (six) months. Administer in upper arm, thigh, or abdomen     diclofenac sodium 1 % Gel  Commonly known as:  VOLTAREN  Apply 4 g topically 4 (four) times daily as needed (pain).     dicyclomine 20 MG tablet  Commonly known as:  BENTYL  Take 1 tablet (20 mg total) by mouth 2 (two) times daily.     docusate sodium 100 MG capsule  Commonly known as:  COLACE  Take 2 capsules (200 mg total) by mouth 2 (two) times daily.     esomeprazole 40 MG capsule  Commonly known as:  NEXIUM  Take 40 mg by mouth 2 (two) times daily.     fexofenadine 180 MG tablet  Commonly known as:  ALLEGRA  Take 180 mg by mouth daily.     meclizine 25 MG tablet  Commonly known as:  ANTIVERT  Take 25 mg by mouth 3 (three) times daily.     methimazole 10 MG tablet  Commonly known as:  TAPAZOLE  Take 1 tablet (10 mg total) by mouth 2 (two)  times daily.     nitroGLYCERIN 0.4 MG SL tablet  Commonly known as:  NITROSTAT  Place 0.4 mg under the tongue every 5 (five) minutes as needed for chest pain (3 doses MAX).     olmesartan 40 MG tablet  Commonly known as:  BENICAR  Take 20 mg by mouth daily.     PREMARIN vaginal cream  Generic drug:  conjugated estrogens  Place 1-2 g vaginally 2 (two) times a week.     PRESERVISION AREDS Caps  Take 1 capsule by mouth 2 (two) times daily.     promethazine 25 MG tablet  Commonly known as:  PHENERGAN  Take 0.5 tablets (12.5 mg total) by mouth every 6 (six) hours as needed for nausea or vomiting.     simvastatin 20 MG tablet  Commonly known as:  ZOCOR  Take 20 mg by mouth every Monday, Wednesday, and Friday.     VITAMIN E PO  Take 1 capsule by mouth daily.         SignedHosie Spangle 03/20/2015, 3:26 PM

## 2015-03-20 NOTE — Progress Notes (Signed)
Patient alert and oriented, mae's well, voiding adequate amount of urine, swallowing without difficulty, no c/o pain. Patient discharged home with family. Script and discharged instructions given to patient. Patient and family stated understanding of d/c instructions given and has an appointment with MD. 

## 2015-03-20 NOTE — Progress Notes (Signed)
Pt very upset , stating shes suffering, Dr Ermalene Postin at bedside, pt refuses to take meds on allergy list,Dr Ermalene Postin at bedside to speak with pt

## 2015-03-20 NOTE — Transfer of Care (Signed)
Immediate Anesthesia Transfer of Care Note  Patient: [name]  Procedure(s) Performed: Procedure(s): THORACIC TWELVE KYPHOPLASTY (N/A)  Patient Location: PACU  Anesthesia Type:General  Level of Consciousness: awake, alert  and oriented  Airway & Oxygen Therapy: Patient Spontanous Breathing and Patient connected to nasal cannula oxygen  Post-op Assessment: Report given to RN, Post -op Vital signs reviewed and stable and Patient moving all extremities X 4  Post vital signs: Reviewed and stable  Last Vitals:  Filed Vitals:   03/20/15 0731  BP: 203/67  Pulse: 70  Temp: [id number] C    Complications: No apparent anesthesia complications

## 2015-03-20 NOTE — Anesthesia Preprocedure Evaluation (Addendum)
Anesthesia Evaluation  Patient identified by MRN, date of birth, ID band Patient awake    Reviewed: Allergy & Precautions, NPO status , Patient's Chart, lab work & pertinent test results, reviewed documented beta blocker date and time   History of Anesthesia Complications Negative for: history of anesthetic complications  Airway Mallampati: II  TM Distance: <3 FB Neck ROM: Full    Dental no notable dental hx.    Pulmonary neg shortness of breath, neg sleep apnea, neg COPD, neg recent URI, former smoker,    breath sounds clear to auscultation       Cardiovascular hypertension, Pt. on medications (-) angina+ CAD, + Past MI and + Peripheral Vascular Disease  (-) CHF + Valvular Problems/Murmurs AS  Rhythm:Regular     Neuro/Psych Seizures -, Well Controlled,   Neuromuscular disease negative psych ROS   GI/Hepatic Neg liver ROS, hiatal hernia, GERD  Medicated and Controlled,  Endo/Other  diabetes, Well Controlled, Type 2Hyperthyroidism   Renal/GU Renal InsufficiencyRenal disease     Musculoskeletal   Abdominal   Peds  Hematology  (+) anemia ,   Anesthesia Other Findings   Reproductive/Obstetrics                          Anesthesia Physical Anesthesia Plan  ASA: III  Anesthesia Plan: General   Post-op Pain Management:    Induction: Intravenous  Airway Management Planned: Oral ETT  Additional Equipment: None  Intra-op Plan:   Post-operative Plan: Extubation in OR  Informed Consent: I have reviewed the patients History and Physical, chart, labs and discussed the procedure including the risks, benefits and alternatives for the proposed anesthesia with the patient or authorized representative who has indicated his/her understanding and acceptance.   Dental advisory given  Plan Discussed with: Anesthesiologist, Surgeon and CRNA  Anesthesia Plan Comments:        Anesthesia Quick  Evaluation

## 2015-03-20 NOTE — Op Note (Signed)
03/20/2015  8:45 AM  PATIENT:  [name]  81 y.o. female  PRE-OPERATIVE DIAGNOSIS:  T12 compression fracture, dorsalgia  POST-OPERATIVE DIAGNOSIS:  T12 compression fracture, dorsalgia  PROCEDURE:  Procedure(s):  THORACIC TWELVE KYPHOPLASTY  SURGEON:  Surgeon(s): Jovita Gamma, MD  ANESTHESIA:   general  EBL:  nil  BLOOD ADMINISTERED:none  COUNT: Correct per nursing staff  DICTATION: Patient was brought to the operating room, placed under general endotracheal anesthesia. AP and lateral C-arm fluoroscopy units were set up, and the T12 vertebra identified. The thoracolumbar region posteriorly was prepped with DuraPrep, and draped in a sterile fashion. The C-arm fluoroscopy units were also draped in a sterile fashion. The entry points to approach the posterior lateral aspect of the T12 pedicles were identified bilaterally.  The skin and subcutaneous tissue were infiltrated with local anesthetic with epinephrine. 3 mm incisions were made on either side at the entry points. The cannulated trocar was passed down to the posterior lateral entry point of the pedicles, on each side. Each trocar was passed through the pedicle with C-arm fluoroscopic guidance into the vertebral body on either side. We then removed the inner trocar, leaving the outer cannula. Using the drill we created a passage through the vertebral body, to the ventral wall. The balloon expanders were placed in the vertebral body on either side, and gently inflated and expanded under C-arm fluoroscopic guidance. Once good compaction was achieved bilaterally, the balloon expanders were deflated, and we began to fill the cavities that had been created with methylmethacrylate. We filled from one side to the other, using C-arm fluoroscopic guidance, until good filling of the cavity, and good interdigitation with the bone was achieved bilaterally. 3 cc of methylmethacrylate was introduced on the right side and 2 cc of methylmethacrylate  was introduced on the left side, for a total of 5 cc. We then carefully removed each of the cannulas, using C-arm for guidance. Final imaging showed good filling of the cavity. Each of the incisions was closed with a single 3-0 undyed Vicryl subcuticular suture, and the Dermabond. Following surgery the patient was turned back to a supine position, to be reversed and the anesthetic, extubated, and transferred to the recovery room for further care.   PLAN OF CARE: Outpatient observation  PATIENT DISPOSITION:  PACU - hemodynamically stable.   Delay start of Pharmacological VTE agent (>24hrs) due to surgical blood loss or risk of bleeding:  yes

## 2015-03-20 NOTE — Discharge Instructions (Signed)

## 2015-03-20 NOTE — Anesthesia Postprocedure Evaluation (Signed)
Anesthesia Post Note  Patient: [name]  Procedure(s) Performed: Procedure(s) (LRB): THORACIC TWELVE KYPHOPLASTY (N/A)  Patient location during evaluation: PACU Anesthesia Type: General Level of consciousness: awake Pain management: pain level controlled Vital Signs Assessment: post-procedure vital signs reviewed and stable Respiratory status: spontaneous breathing Cardiovascular status: stable Postop Assessment: no signs of nausea or vomiting Anesthetic complications: no    Last Vitals:  Filed Vitals:   03/20/15 1045 03/20/15 1110  BP: 136/52 177/50  Pulse: 64 64  Temp: 36.6 C 36.4 C  Resp: 19 18    Last Pain:  Filed Vitals:   03/20/15 1121  PainSc: 1                  Chiann Goffredo

## 2015-03-21 ENCOUNTER — Encounter (HOSPITAL_COMMUNITY): Payer: Self-pay | Admitting: Neurosurgery

## 2015-04-17 ENCOUNTER — Other Ambulatory Visit: Payer: Self-pay | Admitting: Neurosurgery

## 2015-04-18 ENCOUNTER — Encounter (HOSPITAL_COMMUNITY): Payer: Self-pay

## 2015-04-18 ENCOUNTER — Encounter (HOSPITAL_COMMUNITY)
Admission: RE | Admit: 2015-04-18 | Discharge: 2015-04-18 | Disposition: A | Discharge status: Home / Self Care | Payer: Medicare Other | Source: Ambulatory Visit | Attending: Neurosurgery | Admitting: Neurosurgery

## 2015-04-18 DIAGNOSIS — M4856XA Collapsed vertebra, not elsewhere classified, lumbar region, initial encounter for fracture: Secondary | ICD-10-CM | POA: Insufficient documentation

## 2015-04-18 DIAGNOSIS — K219 Gastro-esophageal reflux disease without esophagitis: Secondary | ICD-10-CM | POA: Diagnosis not present

## 2015-04-18 DIAGNOSIS — Z01812 Encounter for preprocedural laboratory examination: Secondary | ICD-10-CM | POA: Insufficient documentation

## 2015-04-18 DIAGNOSIS — I10 Essential (primary) hypertension: Secondary | ICD-10-CM | POA: Diagnosis not present

## 2015-04-18 DIAGNOSIS — Z01818 Encounter for other preprocedural examination: Secondary | ICD-10-CM | POA: Diagnosis present

## 2015-04-18 DIAGNOSIS — Z87891 Personal history of nicotine dependence: Secondary | ICD-10-CM | POA: Diagnosis not present

## 2015-04-18 DIAGNOSIS — I252 Old myocardial infarction: Secondary | ICD-10-CM | POA: Diagnosis not present

## 2015-04-18 DIAGNOSIS — R569 Unspecified convulsions: Secondary | ICD-10-CM | POA: Insufficient documentation

## 2015-04-18 DIAGNOSIS — I2510 Atherosclerotic heart disease of native coronary artery without angina pectoris: Secondary | ICD-10-CM | POA: Diagnosis not present

## 2015-04-18 DIAGNOSIS — Z79899 Other long term (current) drug therapy: Secondary | ICD-10-CM | POA: Diagnosis not present

## 2015-04-18 DIAGNOSIS — E0590 Thyrotoxicosis, unspecified without thyrotoxic crisis or storm: Secondary | ICD-10-CM | POA: Insufficient documentation

## 2015-04-18 DIAGNOSIS — E119 Type 2 diabetes mellitus without complications: Secondary | ICD-10-CM | POA: Insufficient documentation

## 2015-04-18 DIAGNOSIS — Z7982 Long term (current) use of aspirin: Secondary | ICD-10-CM | POA: Diagnosis not present

## 2015-04-18 HISTORY — DX: Thyrotoxicosis, unspecified without thyrotoxic crisis or storm: E05.90

## 2015-04-18 LAB — CBC
HCT: 35.5 % — ABNORMAL LOW (ref 36.0–46.0)
Hemoglobin: 11.7 g/dL — ABNORMAL LOW (ref 12.0–15.0)
MCH: 28.5 pg (ref 26.0–34.0)
MCHC: 33 g/dL (ref 30.0–36.0)
MCV: 86.6 fL (ref 78.0–100.0)
PLATELETS: 228 10*3/uL (ref 150–400)
RBC: 4.1 MIL/uL (ref 3.87–5.11)
RDW: 14.4 % (ref 11.5–15.5)
WBC: 6.8 10*3/uL (ref 4.0–10.5)

## 2015-04-18 LAB — BASIC METABOLIC PANEL
ANION GAP: 9 (ref 5–15)
BUN: 14 mg/dL (ref 6–20)
CO2: 27 mmol/L (ref 22–32)
Calcium: 9.4 mg/dL (ref 8.9–10.3)
Chloride: 102 mmol/L (ref 101–111)
Creatinine, Ser: 1.38 mg/dL — ABNORMAL HIGH (ref 0.44–1.00)
GFR, EST AFRICAN AMERICAN: 40 mL/min — AB (ref >60)
GFR, EST NON AFRICAN AMERICAN: 35 mL/min — AB (ref >60)
Glucose, Bld: 155 mg/dL — ABNORMAL HIGH (ref 65–99)
POTASSIUM: 4 mmol/L (ref 3.5–5.1)
SODIUM: 138 mmol/L (ref 135–145)

## 2015-04-18 LAB — SURGICAL PCR SCREEN
MRSA, PCR: NEGATIVE
STAPHYLOCOCCUS AUREUS: NEGATIVE

## 2015-04-18 NOTE — Pre-Procedure Instructions (Signed)
[name]  04/18/2015      [id number] - [place], [place] [address] Phone: [phone] Fax: [phone]  [id number] - [place], [place] [address] Phone: [phone] Fax: [phone]    Your procedure is scheduled on Monday, April 23, 2015.  Report to [facility] Admitting at 11:00 A.M.  Call this number if you have problems the morning of surgery:  [phone]   Remember:  Do not eat food or drink liquids after midnight Sunday, April 22, 2015  Take these medicines the morning of surgery with A SIP OF WATER : carvedilol (COREG), cloNIDine (CATAPRES), dicyclomine (BENTYL), esomeprazole (NEXIUM), fexofenadine (ALLEGRA), meclizine (ANTIVERT), methimazole (TAPAZOLE), if needed:promethazine (PHENERGAN) for nausea or vomiting, nitroGLYCERIN (NITROSTAT) for chest pain  Stop taking Aspirin, vitamins, and herbal medications such as Cod Liver Oil, Vitamin E . Do not take any NSAIDs ie: Ibuprofen, Advil, Naproxen or any medication containing Aspirin such as diclofenac sodium (VOLTAREN); stop now. How to Manage Your Diabetes Before Surgery Why is it important to control my blood sugar before and after surgery?   Improving blood sugar levels before and after surgery helps healing and can limit problems.  A way of improving blood sugar control is eating a healthy diet by:  - Eating less sugar and carbohydrates  - Increasing activity/exercise  - Talk with your doctor about reaching your blood sugar goals  High blood sugars (greater than 180 mg/dL) can raise your risk of infections and slow down your recovery so you will need to focus on controlling your diabetes during the weeks before surgery.  Make sure that the doctor who takes care of your diabetes knows about your planned surgery including the date and location.  How do I manage my blood sugars before  surgery?   Check your blood sugar at least 4 times a day, 2 days before surgery to make sure that they are not too high or low.   Check your blood sugar the morning of your surgery when you wake up and every 2 hours until you get to the Short-Stay unit.  If your blood sugar is less than 70 mg/dL, you will need to treat for low blood sugar by:  Treat a low blood sugar (less than 70 mg/dL) with 1/2 cup of clear juice (cranberry or apple), 4 glucose tablets, OR glucose gel.  Recheck blood sugar in 15 minutes after treatment (to make sure it is greater than 70 mg/dL).  If blood sugar is not greater than 70 mg/dL on re-check, call [phone] for further instructions.   Report your blood sugar to the Short-Stay nurse when you get to Short-Stay.  References:  [facility], 2007 "How to Manage your Diabetes Before and After Surgery".   Do not wear jewelry, make-up or nail polish.  Do not wear lotions, powders, or perfumes.  You may not wear deodorant.  Do not shave 48 hours prior to surgery.    Do not bring valuables to the hospital.  [facility] is not responsible for any belongings or valuables.  Contacts, dentures or bridgework may not be worn into surgery.  Leave your suitcase in the car.  After surgery it may be brought to your room.  For patients admitted to the hospital, discharge time will be determined by your treatment team.  Patients discharged the day of surgery will not be allowed to drive home.  Name and phone number of your driver:  Special instructions: Shower the night before surgery and the morning of surgery with CHG.  Please read over the following fact sheets that you were given. Pain Booklet, Coughing and Deep Breathing, MRSA Information and Surgical Site Infection Prevention

## 2015-04-18 NOTE — Progress Notes (Addendum)
PCP is Dr Lavone Orn Cardiologist is Dr Pernell Dupre Pt reports she will have an A1c done today with Dr Laurann Montana, and she does not want [place] to do one today. Reports her fasting cbg's run 140's- Dm is diet controlled CXR and EKG noted in epic from 02-2015 Stress test noted in epic from 03-09-2015 Card cath noted in epic from 07-30-2006

## 2015-04-19 NOTE — Progress Notes (Addendum)
Anesthesia Chart Review: Pt is 82 year old [name] scheduled for L1 kyphoplasty on 04/23/15 by Dr. Sherwood Gambler. She is s/p T12 kyphoplasty on 03/20/2015.  PMH includes: CAD (MI 2007; chronic RCA total occlusion), HTN, renal artery stenosis s/p left RA stent '12, heart murmur (very mild AS by 02/2015 echo), DM2, seizures, Meniere's disease, hyperthyroidism, hiatal hernia, GERD. Former smoker. BMI 20.1.   Hospitalized 11/24-11/27/16 for hyponatremia, syncope, hypokalemia, new diagnosis hyperthyroidism, thyroid nodule. Low TSH 0.040. Elevated Free T4 1.71. Free T3 normal at 3.2. She was started on tapazole.  PCP is Dr. Lavone Orn. Office note from 03/15/15 scanned under Media tab. She reported having additional labs at Dr. Delene Ruffini office on 04/18/15--records requested.   Cardiologist is Dr. Daneen Schick, last visit 11/14/14. Last saw cardiologist Dr. Marlyne Beards on 01/03/15 for refractory HTN and RAS.   Medications include: ASA, carvedilol, clonidine, , liver oil, Flexeril, Prolia, Nexium, Allegra, meclizine, Tapazole, olmesartan, simvastatin.   Preoperative labs noted. Na 138. Cr 1.38. Glucose 155. H/H 11.7/35.5.    Chest x-ray 02/26/15 reviewed. New compression fracture of the inferior aspect of T12 since 01/30/2015. Heart and lungs appear normal. Aortic atherosclerosis.  EKG 03/08/15: Sinus rhythm. Probable inferior infarct, old. Probable lateral infarct, old.  Echo 11/Cathy/16:  - Left ventricle: The cavity size was normal. Wall thickness wasincreased in a pattern of mild LVH. Systolic function was normal.The estimated ejection fraction was in the range of 60% to 65%.Wall motion was normal; there were no regional wall motionabnormalities. Doppler parameters are consistent with abnormalleft ventricular relaxation (grade 1 diastolic dysfunction).Doppler parameters are consistent with high ventricular fillingpressure. - Aortic valve: There was very mild stenosis. There was trivial regurgitation.  Valve area (VTI): 0.97 cm^2. Valve area (Vmax): 0.9 cm^2. Valve area (Vmean): 0.89 cm^2. - Mitral valve: Calcified annulus. Valve area by pressure half-time: 2.47 cm^2. Valve area by continuity equation (using LVOT flow): 1.16 cm^2.  Renal angiography 12/20/14: Occluded left renal artery stent with no significant disease affecting the right renal artery. Recommendations: The timing of the left renal artery stent occlusion is unknown and likely chronic and thus I doubt that there would be much benefit at this point from revascularization.   Cardiac cath 07/30/06:  1. Significant coronary artery disease with total occlusion of the right coronary in the mid segment with brisk and widely patent left-to-right collaterals. 2. Moderate left anterior descending disease with 50% to 60% mid left anterior descending stenosis. The vessel is tortuous. The circumflex is widely patent. 3. Left renal artery: Sixty percent ostial narrowing. 4. Overall normal left ventricular function.  Chart will be left for follow-up any additional PCP records/labs.  [facility] Short Stay Center/Anesthesiology Phone [phone] 04/19/2015 7:34 PM  Addendum: Dr. Delene Ruffini records received. He last saw her on 04/19/15. Her labs on 04/18/15 showed an A1c of 6.1 and normal TSH, free T4, free T3. His note states, "No contraindication to upcoming procedure on January 9." She tolerated similar procedure last month. If no acute changes then I anticipate that she can proceed as planned.  [facility] Short Stay Center/Anesthesiology Phone [phone] 04/20/2015 2:47 PM

## 2015-04-20 ENCOUNTER — Encounter (HOSPITAL_COMMUNITY): Payer: Self-pay | Admitting: Emergency Medicine

## 2015-04-20 ENCOUNTER — Emergency Department (HOSPITAL_COMMUNITY): Payer: Medicare Other

## 2015-04-20 ENCOUNTER — Emergency Department (HOSPITAL_COMMUNITY)
Admission: EM | Admit: 2015-04-20 | Discharge: 2015-04-20 | Disposition: A | Discharge status: Home / Self Care | Payer: Medicare Other | Attending: Emergency Medicine | Admitting: Emergency Medicine

## 2015-04-20 DIAGNOSIS — S4991XA Unspecified injury of right shoulder and upper arm, initial encounter: Secondary | ICD-10-CM | POA: Insufficient documentation

## 2015-04-20 DIAGNOSIS — Y9389 Activity, other specified: Secondary | ICD-10-CM | POA: Insufficient documentation

## 2015-04-20 DIAGNOSIS — E0590 Thyrotoxicosis, unspecified without thyrotoxic crisis or storm: Secondary | ICD-10-CM | POA: Diagnosis not present

## 2015-04-20 DIAGNOSIS — Y92410 Unspecified street and highway as the place of occurrence of the external cause: Secondary | ICD-10-CM | POA: Insufficient documentation

## 2015-04-20 DIAGNOSIS — E119 Type 2 diabetes mellitus without complications: Secondary | ICD-10-CM | POA: Diagnosis not present

## 2015-04-20 DIAGNOSIS — M25511 Pain in right shoulder: Secondary | ICD-10-CM

## 2015-04-20 DIAGNOSIS — H8109 Meniere's disease, unspecified ear: Secondary | ICD-10-CM | POA: Insufficient documentation

## 2015-04-20 DIAGNOSIS — Z79899 Other long term (current) drug therapy: Secondary | ICD-10-CM | POA: Diagnosis not present

## 2015-04-20 DIAGNOSIS — Z87891 Personal history of nicotine dependence: Secondary | ICD-10-CM | POA: Diagnosis not present

## 2015-04-20 DIAGNOSIS — S62609A Fracture of unspecified phalanx of unspecified finger, initial encounter for closed fracture: Secondary | ICD-10-CM

## 2015-04-20 DIAGNOSIS — S6991XA Unspecified injury of right wrist, hand and finger(s), initial encounter: Secondary | ICD-10-CM | POA: Diagnosis present

## 2015-04-20 DIAGNOSIS — I252 Old myocardial infarction: Secondary | ICD-10-CM | POA: Insufficient documentation

## 2015-04-20 DIAGNOSIS — Z7982 Long term (current) use of aspirin: Secondary | ICD-10-CM | POA: Diagnosis not present

## 2015-04-20 DIAGNOSIS — Z9889 Other specified postprocedural states: Secondary | ICD-10-CM | POA: Diagnosis not present

## 2015-04-20 DIAGNOSIS — K219 Gastro-esophageal reflux disease without esophagitis: Secondary | ICD-10-CM | POA: Insufficient documentation

## 2015-04-20 DIAGNOSIS — M810 Age-related osteoporosis without current pathological fracture: Secondary | ICD-10-CM | POA: Insufficient documentation

## 2015-04-20 DIAGNOSIS — I10 Essential (primary) hypertension: Secondary | ICD-10-CM | POA: Insufficient documentation

## 2015-04-20 DIAGNOSIS — R011 Cardiac murmur, unspecified: Secondary | ICD-10-CM | POA: Insufficient documentation

## 2015-04-20 DIAGNOSIS — I2510 Atherosclerotic heart disease of native coronary artery without angina pectoris: Secondary | ICD-10-CM | POA: Diagnosis not present

## 2015-04-20 DIAGNOSIS — Y998 Other external cause status: Secondary | ICD-10-CM | POA: Insufficient documentation

## 2015-04-20 DIAGNOSIS — S62622A Displaced fracture of medial phalanx of right middle finger, initial encounter for closed fracture: Secondary | ICD-10-CM | POA: Diagnosis not present

## 2015-04-20 DIAGNOSIS — E785 Hyperlipidemia, unspecified: Secondary | ICD-10-CM | POA: Insufficient documentation

## 2015-04-20 DIAGNOSIS — W010XXA Fall on same level from slipping, tripping and stumbling without subsequent striking against object, initial encounter: Secondary | ICD-10-CM | POA: Insufficient documentation

## 2015-04-20 NOTE — ED Provider Notes (Signed)
CSN: [id number]     Arrival date & time 04/20/15  1741 History   First MD Initiated Contact with Patient 04/20/15 2029     Chief Complaint  Patient presents with  . Fall  . Shoulder Pain     (Consider location/radiation/quality/duration/timing/severity/associated sxs/prior Treatment) Patient is a 82 y.o. female presenting with fall and shoulder pain. The history is provided by the patient.  Fall This is a new problem. The current episode started 3 to 5 hours ago. The problem occurs constantly. The problem has not changed since onset.Pertinent negatives include no chest pain, no abdominal pain, no headaches and no shortness of breath. Nothing aggravates the symptoms. Nothing relieves the symptoms. She has tried nothing for the symptoms.  Shoulder Pain Associated symptoms: no neck pain     Past Medical History  Diagnosis Date  . CAD (coronary artery disease)   . Renal artery stenosis ([place])   . HTN (hypertension)   . Dyslipidemia   . GERD (gastroesophageal reflux disease)   . Osteoporosis   . Meniere disease     "for 20 years" takes Antivert 3 times per day  . Complication of anesthesia     "almost died when I had my breast reduction"  . Heart murmur   . Myocardial infarction ([place]) 2007  . History of hiatal hernia   . Type II diabetes mellitus ([place])     "controlled w/diet and exercise only" (02/26/2015)  . Hyperthyroidism    Past Surgical History  Procedure Laterality Date  . Hip fracture surgery Right 2011    "added rod in my leg and a ball"  . Wrist fracture surgery Left 2012  . Cholecystectomy    . Bladder suspension  x3  . Anterior cervical decomp/discectomy fusion  02/2002    Archie Endo 08/26/2010  . Reduction mammaplasty Bilateral 1985  . Renal artery stent Left 2012  . Peripheral vascular catheterization Left 12/20/2014    Procedure: Renal Angiography;  Surgeon: Wellington Hampshire, MD;  Location: [place] [facility] LAB;  Service: Cardiovascular;  Laterality: Left;  . Vaginal  hysterectomy  ~ 1995  . Fracture surgery    . Back surgery    . Breast lumpectomy  11/2007    breast needle-localized lumpectomy/notes 08/12/2012  . Neck hardware removal  11/2002    Archie Endo 08/26/2010  . Cataract extraction w/ intraocular lens  implant, bilateral Bilateral   . Cardiac catheterization  2007    "couldn't get stents in; I almost died'  . Kyphoplasty N/A 2015-03-31    Procedure: THORACIC TWELVE KYPHOPLASTY;  Surgeon: Jovita Gamma, MD;  Location: [place] NEURO ORS;  Service: Neurosurgery;  Laterality: N/A;  . Tonsillectomy    . Appendectomy     Family History  Problem Relation Age of Onset  . Diabetes type II Mother   . Heart attack Mother   . Heart disease Mother   . Congestive Heart Failure Father   . Pulmonary embolism Father   . Alzheimer's disease Sister   . Heart disease Sister   . Heart disease Brother   . Healthy Sister   . Healthy Sister   . Stroke Brother   . Dementia Brother     VASCULAR  . Stroke Brother   . Diabetes type II Brother   . Other Brother     KILLED IN PLANE CRASH  . Pulmonary disease Sister   . Heart attack Sister   . Kidney failure Sister   . Diabetes type II Sister   . Diabetes type  II Sister   . Kidney failure Sister    Social History  Substance Use Topics  . Smoking status: Former Smoker -- 1.00 packs/day for 30 years    Types: Cigarettes  . Smokeless tobacco: Never Used     Comment: "quit smoking in 1989"  . Alcohol Use: No   OB History    No data available     Review of Systems  Respiratory: Negative for shortness of breath.   Cardiovascular: Negative for chest pain.  Gastrointestinal: Negative for abdominal pain.  Musculoskeletal: Negative for neck pain and neck stiffness.       Right shoulder and middle finger pain  Neurological: Negative for headaches.  Psychiatric/Behavioral: Negative for confusion and agitation.  All other systems reviewed and are negative.     Allergies  Fentanyl; Morphine and related;  Singulair; Actonel; Dilaudid; Fosamax; Hydralazine; Lipitor; Lisinopril; Micardis; Septra; Tekturna; Tricor; Actamin; Amlodipine; Avapro; Cardio complete; Cardizem; Ciprofloxacin; Codeine; Cyclobenzaprine; Forteo; Inapsine; Ivp dye; Shellfish allergy; and Tussionex pennkinetic er  Home Medications   Prior to Admission medications   Medication Sig Start Date End Date Taking? Authorizing Provider  aspirin 325 MG tablet Take 325 mg by mouth at bedtime.   Yes Historical Provider, MD  Calcium Citrate (CITRACAL PO) Take 1,200 mg by mouth daily.   Yes Historical Provider, MD  carvedilol (COREG) 25 MG tablet Take 25 mg by mouth 2 (two) times daily with a meal.   Yes Historical Provider, MD  cholecalciferol (VITAMIN D) 1000 UNITS tablet Take 2,000 Units by mouth daily.    Yes Historical Provider, MD  cloNIDine (CATAPRES) 0.1 MG tablet Take 1 tablet (0.1 mg total) by mouth 3 (three) times daily. 03/11/15  Yes Kelvin Cellar, MD  [facility] Liver Oil CAPS Take 1 capsule by mouth daily.   Yes Historical Provider, MD  esomeprazole (NEXIUM) 40 MG capsule Take 40 mg by mouth 2 (two) times daily.   Yes Historical Provider, MD  fexofenadine (ALLEGRA) 180 MG tablet Take 180 mg by mouth daily.   Yes Historical Provider, MD  methimazole (TAPAZOLE) 10 MG tablet Take 1 tablet (10 mg total) by mouth 2 (two) times daily. 03/11/15  Yes Kelvin Cellar, MD  Multiple Vitamins-Minerals (PRESERVISION AREDS) CAPS Take 1 capsule by mouth 2 (two) times daily.    Yes Historical Provider, MD  olmesartan (BENICAR) 40 MG tablet Take 20 mg by mouth daily.    Yes Historical Provider, MD  PREMARIN vaginal cream Place 1-2 g vaginally 2 (two) times a week. 10/06/14  Yes Historical Provider, MD  simvastatin (ZOCOR) 20 MG tablet Take 20 mg by mouth every Monday, Wednesday, and Friday.  10/14/11  Yes Historical Provider, MD  VITAMIN E PO Take 1 capsule by mouth daily.   Yes Historical Provider, MD  betamethasone dipropionate (DIPROLENE) 0.05 %  cream Apply 1 application topically daily as needed (irritated skin).    Historical Provider, MD  cyclobenzaprine (FLEXERIL) 5 MG tablet Take 1 tablet (5 mg total) by mouth 3 (three) times daily as needed for muscle spasms. Patient not taking: Reported on 04/18/2015 02/07/15   Albertine Patricia, MD  denosumab (PROLIA) 60 MG/ML SOLN injection Inject 60 mg into the skin every 6 (six) months. Administer in upper arm, thigh, or abdomen    Historical Provider, MD  diclofenac sodium (VOLTAREN) 1 % GEL Apply 4 g topically 4 (four) times daily as needed (pain). Patient not taking: Reported on 04/18/2015 02/28/15   Thurnell Lose, MD  dicyclomine (BENTYL) 20 MG  tablet Take 1 tablet (20 mg total) by mouth 2 (two) times daily. Patient not taking: Reported on 04/18/2015 01/30/15   Alvina Chou, PA-C  docusate sodium (COLACE) 100 MG capsule Take 2 capsules (200 mg total) by mouth 2 (two) times daily. Patient taking differently: Take 200 mg by mouth daily as needed.  02/28/15   Thurnell Lose, MD  meclizine (ANTIVERT) 25 MG tablet Take 25 mg by mouth 3 (three) times daily as needed for dizziness.     Historical Provider, MD  nitroGLYCERIN (NITROSTAT) 0.4 MG SL tablet Place 0.4 mg under the tongue every 5 (five) minutes as needed for chest pain (3 doses MAX).    Historical Provider, MD  promethazine (PHENERGAN) 25 MG tablet Take 0.5 tablets (12.5 mg total) by mouth every 6 (six) hours as needed for nausea or vomiting. Patient not taking: Reported on 04/18/2015 01/30/15   Kaitlyn Szekalski, PA-C   BP 191/68 mmHg  Pulse 71  Temp(Src) 98.7 F (37.1 C) (Oral)  Resp 15  SpO2 97% Physical Exam  Constitutional: She is oriented to person, place, and time. She appears well-developed and well-nourished.  HENT:  Head: Normocephalic and atraumatic.  Neck: Normal range of motion.  Cardiovascular: Normal rate and regular rhythm.   Pulmonary/Chest: No stridor. No respiratory distress.  Abdominal: She exhibits no  distension.  Musculoskeletal: She exhibits tenderness (middle phalynx of middle finger of right hand, also with active ROM of right shoulder, worst with abduction. no significant pain with passive ROM\).  Neurological: She is alert and oriented to person, place, and time. No cranial nerve deficit. Coordination normal.  Nursing note and vitals reviewed.   ED Course  Procedures (including critical care time) Labs Review Labs Reviewed - No data to display  Imaging Review Dg Shoulder Right  04/20/2015  CLINICAL DATA:  Slipped and fell, right shoulder pain and right hand pain. EXAM: RIGHT SHOULDER - 2+ VIEW COMPARISON:  None. FINDINGS: There is at least mild osteopenia. No fracture line or displaced fracture fragment seen. Humeral head appears well positioned relative to the glenoid fossa. Acromioclavicular joint space is well aligned. Adjacent right upper ribs appear intact and well aligned. Soft tissues about the right shoulder are unremarkable. IMPRESSION: No fracture or dislocation. Electronically Signed   By: Franki Cabot M.D.   On: 04/20/2015 19:28   Dg Hand Complete Right  04/20/2015  CLINICAL DATA:  RIGHT shoulder pain RIGHT hand pain EXAM: RIGHT HAND - COMPLETE 3+ VIEW COMPARISON:  None. FINDINGS: On the lateral projection there is an angular lucency at base of the middle phalanx of the third digit. This lucency is not identified on alternative views. No additional evidence of fracture dislocation present. IMPRESSION: Potential fracture at the base of the middle phalanx of the third digit. Recommend clinical correlation for point tenderness. Electronically Signed   By: Suzy Bouchard M.D.   On: 04/20/2015 19:31   I have personally reviewed and evaluated these images and lab results as part of my medical decision-making.   EKG Interpretation None      MDM   Final diagnoses:  Right shoulder pain  Phalanx (hand) fracture, closed, initial encounter    82 year old female with  mechanical fall found to have a right middle finger middle phalanx fracture. Neurovascular intact in move through full range of motion so was buddy taped and will follow-up with her primary doctor. Also shoulder pain and decreased range of motion especially in abduction, no evidence of fracture likely rotator cuff injury. Rest  of MSK exam negative for injury, did not hit head or have LOC to need ct head. Will put in sling and have pcp/ortho follow up.     Merrily Pew, MD 04/21/15 [phone]

## 2015-04-20 NOTE — ED Notes (Signed)
Pt states that she was getting [place] of the car at [place] [employer] when she slipped and fell, catching her weight on her rt arm.  C/o rt shoulder pain.  States that she is supposed to be having surgery on her back for fx of L1 on Monday.

## 2015-04-22 MED ORDER — CEFAZOLIN SODIUM-DEXTROSE 2-3 GM-% IV SOLR
2.0000 g | INTRAVENOUS | Status: AC
  Administered 2015-04-23: 2 g via INTRAVENOUS
  Filled 2015-04-22: qty 50

## 2015-04-23 ENCOUNTER — Encounter (HOSPITAL_COMMUNITY): Payer: Self-pay | Admitting: *Deleted

## 2015-04-23 ENCOUNTER — Ambulatory Visit (HOSPITAL_COMMUNITY): Payer: Medicare Other

## 2015-04-23 ENCOUNTER — Encounter (HOSPITAL_COMMUNITY)
Admission: RE | Disposition: A | Discharge status: Home / Self Care | Payer: Self-pay | Source: Ambulatory Visit | Attending: Neurosurgery

## 2015-04-23 ENCOUNTER — Ambulatory Visit (HOSPITAL_COMMUNITY): Payer: Medicare Other | Admitting: Anesthesiology

## 2015-04-23 ENCOUNTER — Observation Stay (HOSPITAL_COMMUNITY)
Admission: RE | Admit: 2015-04-23 | Discharge: 2015-04-23 | Disposition: A | Discharge status: Home / Self Care | Payer: Medicare Other | Source: Ambulatory Visit | Attending: Neurosurgery | Admitting: Neurosurgery

## 2015-04-23 ENCOUNTER — Ambulatory Visit (HOSPITAL_COMMUNITY): Payer: Medicare Other | Admitting: Vascular Surgery

## 2015-04-23 DIAGNOSIS — E0590 Thyrotoxicosis, unspecified without thyrotoxic crisis or storm: Secondary | ICD-10-CM | POA: Insufficient documentation

## 2015-04-23 DIAGNOSIS — M4856XA Collapsed vertebra, not elsewhere classified, lumbar region, initial encounter for fracture: Secondary | ICD-10-CM | POA: Diagnosis present

## 2015-04-23 DIAGNOSIS — Z7982 Long term (current) use of aspirin: Secondary | ICD-10-CM | POA: Diagnosis not present

## 2015-04-23 DIAGNOSIS — N189 Chronic kidney disease, unspecified: Secondary | ICD-10-CM | POA: Insufficient documentation

## 2015-04-23 DIAGNOSIS — E119 Type 2 diabetes mellitus without complications: Secondary | ICD-10-CM | POA: Diagnosis not present

## 2015-04-23 DIAGNOSIS — E785 Hyperlipidemia, unspecified: Secondary | ICD-10-CM | POA: Insufficient documentation

## 2015-04-23 DIAGNOSIS — I252 Old myocardial infarction: Secondary | ICD-10-CM | POA: Insufficient documentation

## 2015-04-23 DIAGNOSIS — I2510 Atherosclerotic heart disease of native coronary artery without angina pectoris: Secondary | ICD-10-CM | POA: Diagnosis not present

## 2015-04-23 DIAGNOSIS — I129 Hypertensive chronic kidney disease with stage 1 through stage 4 chronic kidney disease, or unspecified chronic kidney disease: Secondary | ICD-10-CM | POA: Insufficient documentation

## 2015-04-23 DIAGNOSIS — Z87891 Personal history of nicotine dependence: Secondary | ICD-10-CM | POA: Insufficient documentation

## 2015-04-23 DIAGNOSIS — S32010A Wedge compression fracture of first lumbar vertebra, initial encounter for closed fracture: Secondary | ICD-10-CM | POA: Diagnosis present

## 2015-04-23 DIAGNOSIS — Z419 Encounter for procedure for purposes other than remedying health state, unspecified: Secondary | ICD-10-CM

## 2015-04-23 HISTORY — PX: KYPHOPLASTY: SHX5884

## 2015-04-23 LAB — GLUCOSE, CAPILLARY
Glucose-Capillary: 135 mg/dL — ABNORMAL HIGH (ref 65–99)
Glucose-Capillary: 130 mg/dL — ABNORMAL HIGH (ref 65–99)
Glucose-Capillary: 198 mg/dL — ABNORMAL HIGH (ref 65–99)

## 2015-04-23 SURGERY — KYPHOPLASTY
Anesthesia: General | Site: Spine Lumbar

## 2015-04-23 MED ORDER — BISACODYL 10 MG RE SUPP
10.0000 mg | Freq: Every day | RECTAL | Status: DC | PRN
Start: 2015-04-23 — End: 2015-04-23

## 2015-04-23 MED ORDER — LIDOCAINE HCL (CARDIAC) 20 MG/ML IV SOLN
INTRAVENOUS | Status: DC | PRN
  Administered 2015-04-23: 60 mg via INTRATRACHEAL

## 2015-04-23 MED ORDER — GLYCOPYRROLATE 0.2 MG/ML IJ SOLN
INTRAMUSCULAR | Status: DC | PRN
  Administered 2015-04-23: 0.4 mg via INTRAVENOUS

## 2015-04-23 MED ORDER — DEXMEDETOMIDINE HCL IN NACL 200 MCG/50ML IV SOLN
INTRAVENOUS | Status: AC
  Filled 2015-04-23: qty 50

## 2015-04-23 MED ORDER — MEPERIDINE HCL 50 MG/ML IJ SOLN: 50.0000 mg | INTRAMUSCULAR | Status: DC | PRN

## 2015-04-23 MED ORDER — CYCLOBENZAPRINE HCL 10 MG PO TABS: 10.0000 mg | ORAL_TABLET | Freq: Three times a day (TID) | ORAL | Status: DC | PRN

## 2015-04-23 MED ORDER — OXYCODONE-ACETAMINOPHEN 5-325 MG PO TABS
1.0000 | ORAL_TABLET | ORAL | Status: DC | PRN
  Administered 2015-04-23: 2 via ORAL

## 2015-04-23 MED ORDER — OXYCODONE-ACETAMINOPHEN 5-325 MG PO TABS: 1.0000 | ORAL_TABLET | ORAL | Status: DC | PRN

## 2015-04-23 MED ORDER — SODIUM CHLORIDE 0.9 % IJ SOLN: 3.0000 mL | Freq: Two times a day (BID) | INTRAMUSCULAR | Status: DC

## 2015-04-23 MED ORDER — VITAMIN D 1000 UNITS PO TABS: 2000.0000 [IU] | ORAL_TABLET | Freq: Every day | ORAL | Status: DC

## 2015-04-23 MED ORDER — EPHEDRINE SULFATE 50 MG/ML IJ SOLN
INTRAMUSCULAR | Status: DC | PRN
  Administered 2015-04-23 (×2): 10 mg via INTRAVENOUS

## 2015-04-23 MED ORDER — ACETAMINOPHEN 10 MG/ML IV SOLN
INTRAVENOUS | Status: AC
  Administered 2015-04-23: 1000 mg via INTRAVENOUS
  Filled 2015-04-23: qty 100

## 2015-04-23 MED ORDER — LIDOCAINE-EPINEPHRINE 1 %-1:100000 IJ SOLN
INTRAMUSCULAR | Status: DC | PRN
  Administered 2015-04-23: 8 mL

## 2015-04-23 MED ORDER — MEPERIDINE HCL 25 MG/ML IJ SOLN
INTRAMUSCULAR | Status: AC
  Administered 2015-04-23: 50 mg via INTRAMUSCULAR
  Filled 2015-04-23: qty 2

## 2015-04-23 MED ORDER — PROPOFOL 10 MG/ML IV BOLUS
INTRAVENOUS | Status: DC | PRN
  Administered 2015-04-23: 40 mg via INTRAVENOUS
  Administered 2015-04-23: 90 mg via INTRAVENOUS

## 2015-04-23 MED ORDER — CLONIDINE HCL 0.1 MG PO TABS
0.1000 mg | ORAL_TABLET | Freq: Three times a day (TID) | ORAL | Status: DC
  Filled 2015-04-23: qty 1

## 2015-04-23 MED ORDER — PHENOL 1.4 % MT LIQD: 1.0000 | OROMUCOSAL | Status: DC | PRN

## 2015-04-23 MED ORDER — ROCURONIUM BROMIDE 50 MG/5ML IV SOLN
INTRAVENOUS | Status: AC
  Filled 2015-04-23: qty 1

## 2015-04-23 MED ORDER — METHIMAZOLE 5 MG PO TABS
5.0000 mg | ORAL_TABLET | Freq: Two times a day (BID) | ORAL | Status: DC
Start: 2015-04-23 — End: 2015-04-23
  Filled 2015-04-23: qty 1

## 2015-04-23 MED ORDER — ARTIFICIAL TEARS OP OINT
TOPICAL_OINTMENT | OPHTHALMIC | Status: DC | PRN
  Administered 2015-04-23: 1 via OPHTHALMIC

## 2015-04-23 MED ORDER — ACETAMINOPHEN 325 MG PO TABS: 650.0000 mg | ORAL_TABLET | ORAL | Status: DC | PRN

## 2015-04-23 MED ORDER — SIMVASTATIN 20 MG PO TABS
20.0000 mg | ORAL_TABLET | ORAL | Status: DC
Start: 2015-04-23 — End: 2015-04-23
  Filled 2015-04-23 (×2): qty 1

## 2015-04-23 MED ORDER — NEOSTIGMINE METHYLSULFATE 10 MG/10ML IV SOLN
INTRAVENOUS | Status: DC | PRN
Start: 2015-04-23 — End: 2015-04-23
  Administered 2015-04-23: 3 mg via INTRAVENOUS

## 2015-04-23 MED ORDER — IRBESARTAN 300 MG PO TABS: 300.0000 mg | ORAL_TABLET | Freq: Every day | ORAL | Status: DC

## 2015-04-23 MED ORDER — ACETAMINOPHEN 650 MG RE SUPP: 650.0000 mg | RECTAL | Status: DC | PRN

## 2015-04-23 MED ORDER — ROCURONIUM BROMIDE 100 MG/10ML IV SOLN
INTRAVENOUS | Status: DC | PRN
Start: 2015-04-23 — End: 2015-04-23
  Administered 2015-04-23: 30 mg via INTRAVENOUS

## 2015-04-23 MED ORDER — HYDROXYZINE HCL 25 MG PO TABS
50.0000 mg | ORAL_TABLET | ORAL | Status: DC | PRN
Start: 2015-04-23 — End: 2015-04-23

## 2015-04-23 MED ORDER — ALUM & MAG HYDROXIDE-SIMETH 200-200-20 MG/5ML PO SUSP: 30.0000 mL | Freq: Four times a day (QID) | ORAL | Status: DC | PRN

## 2015-04-23 MED ORDER — MENTHOL 3 MG MT LOZG: 1.0000 | LOZENGE | OROMUCOSAL | Status: DC | PRN

## 2015-04-23 MED ORDER — SODIUM CHLORIDE 0.9 % IJ SOLN: 3.0000 mL | INTRAMUSCULAR | Status: DC | PRN

## 2015-04-23 MED ORDER — PROPOFOL 10 MG/ML IV BOLUS
INTRAVENOUS | Status: AC
  Filled 2015-04-23: qty 20

## 2015-04-23 MED ORDER — OXYCODONE-ACETAMINOPHEN 5-325 MG PO TABS
ORAL_TABLET | ORAL | Status: AC
  Filled 2015-04-23: qty 2

## 2015-04-23 MED ORDER — CARVEDILOL 25 MG PO TABS
25.0000 mg | ORAL_TABLET | Freq: Two times a day (BID) | ORAL | Status: DC
  Filled 2015-04-23 (×2): qty 1
  Filled 2015-04-23: qty 4
  Filled 2015-04-23: qty 1

## 2015-04-23 MED ORDER — NITROGLYCERIN 0.4 MG SL SUBL: 0.4000 mg | SUBLINGUAL_TABLET | SUBLINGUAL | Status: DC | PRN

## 2015-04-23 MED ORDER — ONDANSETRON HCL 4 MG/2ML IJ SOLN
INTRAMUSCULAR | Status: DC | PRN
  Administered 2015-04-23: 4 mg via INTRAVENOUS

## 2015-04-23 MED ORDER — KCL IN DEXTROSE-NACL 20-5-0.45 MEQ/L-%-% IV SOLN: INTRAVENOUS | Status: DC

## 2015-04-23 MED ORDER — SODIUM CHLORIDE 0.9 % IV SOLN: 250.0000 mL | INTRAVENOUS | Status: DC

## 2015-04-23 MED ORDER — MAGNESIUM HYDROXIDE 400 MG/5ML PO SUSP: 30.0000 mL | Freq: Every day | ORAL | Status: DC | PRN

## 2015-04-23 MED ORDER — LACTATED RINGERS IV SOLN
INTRAVENOUS | Status: DC
  Administered 2015-04-23 (×2): via INTRAVENOUS

## 2015-04-23 MED ORDER — BUPIVACAINE HCL (PF) 0.25 % IJ SOLN
INTRAMUSCULAR | Status: DC | PRN
  Administered 2015-04-23: 8 mL

## 2015-04-23 MED ORDER — DEXMEDETOMIDINE HCL 200 MCG/2ML IV SOLN
INTRAVENOUS | Status: DC | PRN
  Administered 2015-04-23 (×2): 8 ug via INTRAVENOUS

## 2015-04-23 MED ORDER — IOHEXOL 300 MG/ML  SOLN
INTRAMUSCULAR | Status: DC | PRN
  Administered 2015-04-23: 50 mL via INTRAVENOUS

## 2015-04-23 SURGICAL SUPPLY — 42 items
BANDAGE ADH SHEER 1  50/CT (GAUZE/BANDAGES/DRESSINGS) ×8 IMPLANT
BLADE CLIPPER SURG (BLADE) IMPLANT
BLADE SURG 11 STRL SS (BLADE) ×2 IMPLANT
CEMENT BONE KYPHON CDS (Cement) ×2 IMPLANT
DECANTER SPIKE VIAL GLASS SM (MISCELLANEOUS) ×4 IMPLANT
DERMABOND ADVANCED (GAUZE/BANDAGES/DRESSINGS) ×1
DERMABOND ADVANCED .7 DNX12 (GAUZE/BANDAGES/DRESSINGS) ×1 IMPLANT
DRAPE C-ARM 42X72 X-RAY (DRAPES) ×2 IMPLANT
DRAPE INCISE IOBAN 66X45 STRL (DRAPES) ×2 IMPLANT
DRAPE LAPAROTOMY 100X72X124 (DRAPES) ×2 IMPLANT
DRAPE PROXIMA HALF (DRAPES) ×2 IMPLANT
DURAPREP 26ML APPLICATOR (WOUND CARE) ×2 IMPLANT
GAUZE SPONGE 4X4 16PLY XRAY LF (GAUZE/BANDAGES/DRESSINGS) ×2 IMPLANT
GLOVE BIOGEL PI IND STRL 6.5 (GLOVE) ×1 IMPLANT
GLOVE BIOGEL PI IND STRL 8 (GLOVE) ×1 IMPLANT
GLOVE BIOGEL PI INDICATOR 6.5 (GLOVE) ×1
GLOVE BIOGEL PI INDICATOR 8 (GLOVE) ×1
GLOVE ECLIPSE 7.5 STRL STRAW (GLOVE) ×2 IMPLANT
GLOVE EXAM NITRILE LRG STRL (GLOVE) IMPLANT
GLOVE EXAM NITRILE MD LF STRL (GLOVE) ×2 IMPLANT
GLOVE EXAM NITRILE XL STR (GLOVE) IMPLANT
GLOVE EXAM NITRILE XS STR PU (GLOVE) IMPLANT
GLOVE SURG SS PI 6.5 STRL IVOR (GLOVE) ×2 IMPLANT
GOWN STRL REUS W/ TWL LRG LVL3 (GOWN DISPOSABLE) ×1 IMPLANT
GOWN STRL REUS W/ TWL XL LVL3 (GOWN DISPOSABLE) ×1 IMPLANT
GOWN STRL REUS W/TWL 2XL LVL3 (GOWN DISPOSABLE) IMPLANT
GOWN STRL REUS W/TWL LRG LVL3 (GOWN DISPOSABLE) ×1
GOWN STRL REUS W/TWL XL LVL3 (GOWN DISPOSABLE) ×1
KIT BASIN OR (CUSTOM PROCEDURE TRAY) ×2 IMPLANT
KIT ROOM TURNOVER OR (KITS) ×2 IMPLANT
NEEDLE HYPO 25X1 1.5 SAFETY (NEEDLE) ×2 IMPLANT
NS IRRIG 1000ML POUR BTL (IV SOLUTION) ×2 IMPLANT
PACK SURGICAL SETUP 50X90 (CUSTOM PROCEDURE TRAY) ×2 IMPLANT
PAD ARMBOARD 7.5X6 YLW CONV (MISCELLANEOUS) ×6 IMPLANT
SPECIMEN JAR SMALL (MISCELLANEOUS) IMPLANT
SUT VIC AB 3-0 SH 8-18 (SUTURE) ×2 IMPLANT
SUT VIC AB 4-0 P-3 18X BRD (SUTURE) ×1 IMPLANT
SUT VIC AB 4-0 P3 18 (SUTURE) ×1
SYR CONTROL 10ML LL (SYRINGE) ×4 IMPLANT
TOWEL OR 17X24 6PK STRL BLUE (TOWEL DISPOSABLE) ×2 IMPLANT
TOWEL OR 17X26 10 PK STRL BLUE (TOWEL DISPOSABLE) ×2 IMPLANT
TRAY KYPHOPAK 20/3 ONESTEP CDS (KITS) ×2 IMPLANT

## 2015-04-23 NOTE — Op Note (Signed)
04/23/2015  3:42 PM  PATIENT:  [name]  82 y.o. female  PRE-OPERATIVE DIAGNOSIS:  Lumbar 1 compresseion fracture  POST-OPERATIVE DIAGNOSIS:  Lumbar 1 compresseion fracture  PROCEDURE:  Procedure(s): KYPHOPLASTY - LUMBAR ONE  SURGEON:  Surgeon(s): Jovita Gamma, MD  ANESTHESIA:   general  EBL:   25 cc  BLOOD ADMINISTERED:none  COUNT: correct per nursing staff  DICTATION: Patient was brought to the operating room, placed under general endotracheal anesthesia. AP and lateral C-arm fluoroscopy units were set up, and the  L1 vertebra identified. The  thoracolumbar region posteriorly was prepped with DuraPrep, and draped in a sterile fashion. The C-arm fluoroscopy units were also draped in a sterile fashion. The entry points to approach the posterior lateral aspect of the  L1 pedicles were identified bilaterally.  The skin and subcutaneous tissue were infiltrated with local anesthetic with epinephrine. 3 mm incisions were made on either side at the entry points. The cannulated trocar was passed down to the posterior lateral entry point of the pedicles, on each side. Each trocar was passed through the pedicle with C-arm fluoroscopic guidance into the vertebral body on either side. We then removed the inner trocar, leaving the outer cannula. Using the drill we created a passage through the vertebral body, to the ventral wall. The balloon expanders were placed in the vertebral body on either side, and gently inflated and expanded under C-arm fluoroscopic guidance. Once good compaction was achieved bilaterally, the balloon expanders were deflated, and we began to fill the cavities that had been created with methylmethacrylate. We filled from the right side using C-arm fluoroscopic guidance. There was good communication across the midline , with filling of both cavities and good interdigitation with the bone was achieved. A total of  5-6 cc of methylmethacrylate was introduced. We then carefully  removed each of the cannulas, using C-arm for guidance. Final imaging showed good filling of the cavity. Each of the incisions was closed with a single 3-0 undyed Vicryl subcuticular suture, and Dermabond. Following surgery the patient was turned back to a supine position, to be reversed and the anesthetic, extubated, and transferred to the recovery room for further care.   PLAN OF CARE: Admit for overnight observation  PATIENT DISPOSITION:  PACU - hemodynamically stable.   Delay start of Pharmacological VTE agent (>24hrs) due to surgical blood loss or risk of bleeding:  yes

## 2015-04-23 NOTE — Transfer of Care (Signed)
Immediate Anesthesia Transfer of Care Note  Patient: [name]  Procedure(s) Performed: Procedure(s) with comments: KYPHOPLASTY - LUMBAR ONE (N/A) - L1 Kyphoplasty  Patient Location: PACU  Anesthesia Type:General  Level of Consciousness: awake, oriented and patient cooperative  Airway & Oxygen Therapy: Patient Spontanous Breathing and Patient connected to nasal cannula oxygen  Post-op Assessment: Report given to RN and Post -op Vital signs reviewed and stable  Post vital signs: Reviewed and stable  Last Vitals:  Filed Vitals:   04/23/15 1130 04/23/15 1554  BP: 134/38 151/49  Pulse: 58 54  Temp: 36.4 C 36.6 C  Resp: 16 17    Complications: No apparent anesthesia complications

## 2015-04-23 NOTE — Progress Notes (Signed)
Discharge instructions/education and Rx given to patient with husband at bedside and they both verbalized understanding. No redness, no swelling, no drainage noted on incision. Pain is low to moderate, patient tolerated her dinner well, voided and walked on the hallway with assistance. Patient D/C to home via wheelchair.

## 2015-04-23 NOTE — Discharge Summary (Signed)
Physician Discharge Summary  Patient ID: [name] MRN: [id number] DOB/AGE: 1931-05-08 82 y.o.  Admit date: 04/23/2015 Discharge date: 04/23/2015  Admission Diagnoses:  Lumbar 1 compresseion fracture  Discharge Diagnoses:  Lumbar 1 compresseion fracture Active Problems:   Compression fracture of L1 lumbar vertebra [facility])   Discharged Condition: good  Hospital Course: Patient was admitted, underwent a L1 kyphoplasty. Postoperatively she has done well. She is up and living actively. She has voided. She is asking to be discharged to home. She's been given instructions regarding wound care and activities following discharge. She is scheduled to follow-up with me in the office about 3 weeks, with x-rays.  Discharge Exam: Blood pressure 143/48, pulse 58, temperature 97.8 F (36.6 C), temperature source Oral, resp. rate 16, SpO2 100 %.  Disposition: 01-Home or Self Care     Medication List    TAKE these medications        aspirin 325 MG tablet  Take 325 mg by mouth at bedtime.     betamethasone dipropionate 0.05 % cream  Commonly known as:  DIPROLENE  Apply 1 application topically daily as needed (irritated skin).     carvedilol 25 MG tablet  Commonly known as:  COREG  Take 25 mg by mouth 2 (two) times daily with a meal.     cholecalciferol 1000 units tablet  Commonly known as:  VITAMIN D  Take 2,000 Units by mouth daily.     CITRACAL PO  Take 1,200 mg by mouth daily.     cloNIDine 0.1 MG tablet  Commonly known as:  CATAPRES  Take 1 tablet (0.1 mg total) by mouth 3 (three) times daily.     Cod Liver Oil Caps  Take 1 capsule by mouth daily.     cyclobenzaprine 5 MG tablet  Commonly known as:  FLEXERIL  Take 1 tablet (5 mg total) by mouth 3 (three) times daily as needed for muscle spasms.     denosumab 60 MG/ML Soln injection  Commonly known as:  PROLIA  Inject 60 mg into the skin every 6 (six) months. Administer in upper arm, thigh, or abdomen     diclofenac  sodium 1 % Gel  Commonly known as:  VOLTAREN  Apply 4 g topically 4 (four) times daily as needed (pain).     dicyclomine 20 MG tablet  Commonly known as:  BENTYL  Take 1 tablet (20 mg total) by mouth 2 (two) times daily.     docusate sodium 100 MG capsule  Commonly known as:  COLACE  Take 2 capsules (200 mg total) by mouth 2 (two) times daily.     esomeprazole 40 MG capsule  Commonly known as:  NEXIUM  Take 40 mg by mouth 2 (two) times daily.     fexofenadine 180 MG tablet  Commonly known as:  ALLEGRA  Take 180 mg by mouth daily.     meclizine 25 MG tablet  Commonly known as:  ANTIVERT  Take 25 mg by mouth 3 (three) times daily as needed for dizziness.     methimazole 10 MG tablet  Commonly known as:  TAPAZOLE  Take 1 tablet (10 mg total) by mouth 2 (two) times daily.     nitroGLYCERIN 0.4 MG SL tablet  Commonly known as:  NITROSTAT  Place 0.4 mg under the tongue every 5 (five) minutes as needed for chest pain (3 doses MAX).     olmesartan 40 MG tablet  Commonly known as:  BENICAR  Take 20 mg  by mouth daily.     oxyCODONE-acetaminophen 5-325 MG tablet  Commonly known as:  PERCOCET/ROXICET  Take 1 tablet by mouth every 4 (four) hours as needed for severe pain (1 tablet q4- 6 hours PRN).     oxyCODONE-acetaminophen 5-325 MG tablet  Commonly known as:  PERCOCET/ROXICET  Take 1-2 tablets by mouth every 4 (four) hours as needed (pain).     PREMARIN vaginal cream  Generic drug:  conjugated estrogens  Place 1-2 g vaginally 2 (two) times a week.     PRESERVISION AREDS Caps  Take 1 capsule by mouth 2 (two) times daily.     promethazine 25 MG tablet  Commonly known as:  PHENERGAN  Take 0.5 tablets (12.5 mg total) by mouth every 6 (six) hours as needed for nausea or vomiting.     simvastatin 20 MG tablet  Commonly known as:  ZOCOR  Take 20 mg by mouth every Monday, Wednesday, and Friday.     VITAMIN E PO  Take 1 capsule by mouth daily.          SignedHosie Spangle 04/23/2015, 8:24 PM

## 2015-04-23 NOTE — H&P (Signed)
Subjective: Patient is a 82 y.o. female who is admitted for a L1 kyphoplasty for a L1 compression fracture. Patient is about 5 weeks status post a T12 kyphoplasty for a T12 compression fracture. Postoperatively she initially did well, but then developed increased pain in the lumbar region, and x-rays and subsequent MRI confirmed a new L1 compression fracture. Patient admitted now for kyphoplasty.   Patient Active Problem List   Diagnosis Date Noted  . T12 compression fracture ([place]) 07-Apr-2015  . Hyperthyroidism 03/11/2015  . Thyroid nodule 03/11/2015  . Syncope 03/08/2015  . Hypokalemia 03/08/2015  . Weakness 02/26/2015  . Compression fracture of T12 vertebra ([place]) 02/26/2015  . Chronic kidney disease 02/26/2015  . Multiple allergies 02/26/2015  . Abdominal pain 02/04/2015  . Hyponatremia 02/04/2015  . Left flank pain 02/03/2015  . LLQ abdominal pain 08/21/2012  . Indigestion 12/21/2011  . Renal artery stenosis ([place]) 12/21/2011  . DM (diabetes mellitus) ([place]) 12/21/2011  . CAD (coronary artery disease) 12/21/2011  . HTN (hypertension) 12/21/2011  . Hyperlipidemia 12/21/2011  . GERD (gastroesophageal reflux disease) 12/21/2011   Past Medical History  Diagnosis Date  . CAD (coronary artery disease)   . Renal artery stenosis ([place])   . HTN (hypertension)   . Dyslipidemia   . GERD (gastroesophageal reflux disease)   . Osteoporosis   . Meniere disease     "for 20 years" takes Antivert 3 times per day  . Complication of anesthesia     "almost died when I had my breast reduction"  . Heart murmur   . Myocardial infarction ([place]) 2007  . History of hiatal hernia   . Type II diabetes mellitus ([place])     "controlled w/diet and exercise only" (02/26/2015)  . Hyperthyroidism     Past Surgical History  Procedure Laterality Date  . Hip fracture surgery Right 2011    "added rod in my leg and a ball"  . Wrist fracture surgery Left 2012  . Cholecystectomy    . Bladder suspension  x3   . Anterior cervical decomp/discectomy fusion  02/2002    Archie Endo 08/26/2010  . Reduction mammaplasty Bilateral 1985  . Renal artery stent Left 2012  . Peripheral vascular catheterization Left 12/20/2014    Procedure: Renal Angiography;  Surgeon: Wellington Hampshire, MD;  Location: [facility];  Service: Cardiovascular;  Laterality: Left;  . Vaginal hysterectomy  ~ 1995  . Fracture surgery    . Back surgery    . Breast lumpectomy  11/2007    breast needle-localized lumpectomy/notes 08/12/2012  . Neck hardware removal  11/2002    Archie Endo 08/26/2010  . Cataract extraction w/ intraocular lens  implant, bilateral Bilateral   . Cardiac catheterization  2007    "couldn't get stents in; I almost died'  . Kyphoplasty N/A Apr 07, 2015    Procedure: THORACIC TWELVE KYPHOPLASTY;  Surgeon: Jovita Gamma, MD;  Location: [facility];  Service: Neurosurgery;  Laterality: N/A;  . Tonsillectomy    . Appendectomy      Prescriptions prior to admission  Medication Sig Dispense Refill Last Dose  . Calcium Citrate (CITRACAL PO) Take 1,200 mg by mouth daily.   04/22/2015 at Unknown time  . carvedilol (COREG) 25 MG tablet Take 25 mg by mouth 2 (two) times daily with a meal.   04/23/2015 at 1000  . cholecalciferol (VITAMIN D) 1000 UNITS tablet Take 2,000 Units by mouth daily.    04/22/2015 at Unknown time  . cloNIDine (CATAPRES) 0.1 MG tablet Take 1  tablet (0.1 mg total) by mouth 3 (three) times daily. 60 tablet 11 04/23/2015 at Unknown time  . Cod Liver Oil CAPS Take 1 capsule by mouth daily.   04/22/2015 at Unknown time  . docusate sodium (COLACE) 100 MG capsule Take 2 capsules (200 mg total) by mouth 2 (two) times daily. (Patient taking differently: Take 200 mg by mouth daily as needed. ) 20 capsule 0 Past Week at Unknown time  . esomeprazole (NEXIUM) 40 MG capsule Take 40 mg by mouth 2 (two) times daily.   04/23/2015 at Unknown time  . fexofenadine (ALLEGRA) 180 MG tablet Take 180 mg by mouth daily.   04/23/2015 at Unknown  time  . meclizine (ANTIVERT) 25 MG tablet Take 25 mg by mouth 3 (three) times daily as needed for dizziness.    04/23/2015 at Unknown time  . methimazole (TAPAZOLE) 10 MG tablet Take 1 tablet (10 mg total) by mouth 2 (two) times daily. (Patient taking differently: Take 5 mg by mouth 2 (two) times daily. ) 60 tablet 1 04/23/2015 at Unknown time  . olmesartan (BENICAR) 40 MG tablet Take 20 mg by mouth daily.    04/23/2015 at Unknown time  . oxyCODONE-acetaminophen (PERCOCET/ROXICET) 5-325 MG tablet Take 1 tablet by mouth every 4 (four) hours as needed for severe pain (1 tablet q4- 6 hours PRN).   04/23/2015 at Unknown time  . aspirin 325 MG tablet Take 325 mg by mouth at bedtime.   04/18/2015  . betamethasone dipropionate (DIPROLENE) 0.05 % cream Apply 1 application topically daily as needed (irritated skin).   Unknown at Unknown time  . cyclobenzaprine (FLEXERIL) 5 MG tablet Take 1 tablet (5 mg total) by mouth 3 (three) times daily as needed for muscle spasms. (Patient not taking: Reported on 04/18/2015) 20 tablet 0 Not Taking at Unknown time  . denosumab (PROLIA) 60 MG/ML SOLN injection Inject 60 mg into the skin every 6 (six) months. Administer in upper arm, thigh, or abdomen   over 3 months at unknown time  . diclofenac sodium (VOLTAREN) 1 % GEL Apply 4 g topically 4 (four) times daily as needed (pain). (Patient not taking: Reported on 04/18/2015) 1 Tube 0 Not Taking at Unknown time  . dicyclomine (BENTYL) 20 MG tablet Take 1 tablet (20 mg total) by mouth 2 (two) times daily. (Patient not taking: Reported on 04/18/2015) 20 tablet 0 Not Taking at Unknown time  . Multiple Vitamins-Minerals (PRESERVISION AREDS) CAPS Take 1 capsule by mouth 2 (two) times daily.    04/20/2015  . nitroGLYCERIN (NITROSTAT) 0.4 MG SL tablet Place 0.4 mg under the tongue every 5 (five) minutes as needed for chest pain (3 doses MAX).   More than a month at Unknown time  . PREMARIN vaginal cream Place 1-2 g vaginally 2 (two) times a week.  4  04/21/2015  . promethazine (PHENERGAN) 25 MG tablet Take 0.5 tablets (12.5 mg total) by mouth every 6 (six) hours as needed for nausea or vomiting. (Patient not taking: Reported on 04/18/2015) 12 tablet 0 Not Taking at Unknown time  . simvastatin (ZOCOR) 20 MG tablet Take 20 mg by mouth every Monday, Wednesday, and Friday.    04/20/2015  . VITAMIN E PO Take 1 capsule by mouth daily.   04/20/2015   Allergies  Allergen Reactions  . Fentanyl Anaphylaxis    Cardiac arrest and coded  . Morphine And Related Anaphylaxis    Cardiac arrest and coded Per Dr. Conley Canal patient has taken percocet without issue  .  Singulair [Montelukast] Palpitations and Other (See Comments)    Increased BP and HR  . Actonel [Risedronate Sodium] Rash and Other (See Comments)    weakness  . Dilaudid [Hydromorphone Hcl] Rash and Other (See Comments)    Crying and screaming  . Fosamax [Alendronate Sodium] Rash and Other (See Comments)    Weakness and myalgias  . Hydralazine Diarrhea and Other (See Comments)    Swelling in stomach  . Lipitor [Atorvastatin] Swelling and Rash    Tongue swelling   . Lisinopril Diarrhea, Rash and Cough  . Micardis [Telmisartan] Diarrhea and Rash  . Septra [Sulfamethoxazole-Trimethoprim] Nausea And Vomiting and Rash  . Tekturna [Aliskiren] Swelling and Rash    Swelling of tongue  . Tricor [Fenofibrate] Rash and Other (See Comments)    Flu symptoms  . Actamin [Acetaminophen] Rash  . Amlodipine Diarrhea  . Avapro [Irbesartan] Rash  . Cardio Complete [Nutritional Supplements] Rash  . Cardizem [Diltiazem Hcl] Rash  . Ciprofloxacin Rash and Other (See Comments)    Black spot on body  . Codeine Rash  . Cyclobenzaprine Nausea Only  . Forteo [Teriparatide (Recombinant)] Rash  . Inapsine [Droperidol] Rash  . Ivp Dye [Iodinated Diagnostic Agents] Rash  . Shellfish Allergy Rash  . Tussionex Pennkinetic Er [Hydrocod Polst-Cpm Polst Er] Itching and Rash    Social History  Substance Use Topics   . Smoking status: Former Smoker -- 1.00 packs/day for 30 years    Types: Cigarettes  . Smokeless tobacco: Never Used     Comment: "quit smoking in 1989"  . Alcohol Use: No    Family History  Problem Relation Age of Onset  . Diabetes type II Mother   . Heart attack Mother   . Heart disease Mother   . Congestive Heart Failure Father   . Pulmonary embolism Father   . Alzheimer's disease Sister   . Heart disease Sister   . Heart disease Brother   . Healthy Sister   . Healthy Sister   . Stroke Brother   . Dementia Brother     VASCULAR  . Stroke Brother   . Diabetes type II Brother   . Other Brother     KILLED IN PLANE CRASH  . Pulmonary disease Sister   . Heart attack Sister   . Kidney failure Sister   . Diabetes type II Sister   . Diabetes type II Sister   . Kidney failure Sister      Review of Systems Pertinent items noted in HPI and remainder of comprehensive ROS otherwise negative.  Objective: Vital signs in last 24 hours: Temp:  [97.5 F (36.4 C)] 97.5 F (36.4 C) (01/09 1130) Pulse Rate:  [58] 58 (01/09 1130) Resp:  [16] 16 (01/09 1130) BP: (134)/(38) 134/38 mmHg (01/09 1130) SpO2:  [98 %] 98 % (01/09 1130)  EXAM: Patient is a well-developed well-nourished white female in no acute distress. Lungs are clear to auscultation , the patient has symmetrical respiratory excursion. Heart has a regular rate and rhythm normal S1 and S2 no murmur. Abdomen is soft nontender nondistended bowel sounds are present. Extremity examination shows no clubbing cyanosis or edema. Motor examination shows 5 over 5 strength in the lower extremities including the iliopsoas quadriceps dorsiflexor extensor hallicus longus and plantar flexor bilaterally. Sensation is intact to pinprick in the distal lower extremities. Reflexes are symmetrical bilaterally. No pathologic reflexes are present. Patient has a normal gait and stance.   Data Review:CBC    Component Value Date/Time  WBC 6.8  04/18/2015 1138   RBC 4.10 04/18/2015 1138   HGB 11.7* 04/18/2015 1138   HCT 35.5* 04/18/2015 1138   PLT 228 04/18/2015 1138   MCV 86.6 04/18/2015 1138   MCH 28.5 04/18/2015 1138   MCHC 33.0 04/18/2015 1138   RDW 14.4 04/18/2015 1138   LYMPHSABS 1.3 03/08/2015 1610   MONOABS 0.4 03/08/2015 1610   EOSABS 0.0 03/08/2015 1610   BASOSABS 0.0 03/08/2015 1610                          BMET    Component Value Date/Time   NA 138 04/18/2015 1138   K 4.0 04/18/2015 1138   CL 102 04/18/2015 1138   CO2 27 04/18/2015 1138   GLUCOSE 155* 04/18/2015 1138   BUN 14 04/18/2015 1138   CREATININE 1.38* 04/18/2015 1138   CALCIUM 9.4 04/18/2015 1138   GFRNONAA 35* 04/18/2015 1138   GFRAA 40* 04/18/2015 1138     Assessment/Plan: Patient with a new L1 compression fracture, with significant pain, who is admitted now for a L1 kyphoplasty. I've discussed with the patient the nature of his condition, the nature the surgical procedure, the typical length of surgery, hospital stay, and overall recuperation. We discussed limitations postoperatively. I discussed risks of surgery including risks of infection, bleeding, possibly need for transfusion, the risk of nerve root dysfunction with pain, weakness, numbness, or paresthesias, or risk of dural tear and CSF leakage and possible need for further surgery, and the risk of anesthetic complications including myocardial infarction, stroke, pneumonia, and death. Understanding all this the patient does wish to proceed with surgery and is admitted for such.   Hosie Spangle, MD 04/23/2015 2:26 PM

## 2015-04-23 NOTE — Anesthesia Preprocedure Evaluation (Addendum)
Anesthesia Evaluation  Patient identified by MRN, date of birth, ID band Patient awake    Airway Mallampati: II  TM Distance: >3 FB Neck ROM: Full    Dental  (+) Teeth Intact, Dental Advisory Given, Implants   Pulmonary former smoker,    breath sounds clear to auscultation       Cardiovascular hypertension, + CAD, + Past MI and + Peripheral Vascular Disease   Rhythm:Regular Rate:Normal     Neuro/Psych    GI/Hepatic hiatal hernia, GERD  ,  Endo/Other  diabetesHyperthyroidism   Renal/GU Renal disease     Musculoskeletal   Abdominal   Peds  Hematology   Anesthesia Other Findings   Reproductive/Obstetrics                            Anesthesia Physical Anesthesia Plan  ASA: III  Anesthesia Plan: General   Post-op Pain Management:    Induction: Intravenous  Airway Management Planned: Oral ETT  Additional Equipment:   Intra-op Plan:   Post-operative Plan: Possible Post-op intubation/ventilation  Informed Consent: I have reviewed the patients History and Physical, chart, labs and discussed the procedure including the risks, benefits and alternatives for the proposed anesthesia with the patient or authorized representative who has indicated his/her understanding and acceptance.   Dental advisory given  Plan Discussed with: CRNA, Anesthesiologist and Surgeon  Anesthesia Plan Comments:         Anesthesia Quick Evaluation

## 2015-04-24 ENCOUNTER — Encounter (HOSPITAL_COMMUNITY): Payer: Self-pay | Admitting: Neurosurgery

## 2015-04-24 NOTE — Anesthesia Postprocedure Evaluation (Signed)
Anesthesia Post Note  Patient: [name]  Procedure(s) Performed: Procedure(s) (LRB): KYPHOPLASTY - LUMBAR ONE (N/A)  Patient location during evaluation: PACU Anesthesia Type: General Level of consciousness: awake Pain management: pain level controlled Vital Signs Assessment: post-procedure vital signs reviewed and stable Respiratory status: spontaneous breathing Cardiovascular status: stable Anesthetic complications: no    Last Vitals:  Filed Vitals:   04/23/15 1813 04/23/15 2000  BP: 144/43 143/48  Pulse: 50 58  Temp:  36.6 C  Resp: 16 16    Last Pain:  Filed Vitals:   04/23/15 2014  PainSc: 2                  EDWARDS,Jini Horiuchi

## 2015-05-17 ENCOUNTER — Other Ambulatory Visit: Payer: Self-pay | Admitting: Internal Medicine

## 2015-05-17 ENCOUNTER — Ambulatory Visit
Admission: RE | Admit: 2015-05-17 | Discharge: 2015-05-17 | Disposition: A | Discharge status: Home / Self Care | Payer: Medicare Other | Source: Ambulatory Visit | Attending: Internal Medicine | Admitting: Internal Medicine

## 2015-05-17 DIAGNOSIS — S62609B Fracture of unspecified phalanx of unspecified finger, initial encounter for open fracture: Secondary | ICD-10-CM

## 2015-09-04 ENCOUNTER — Encounter (HOSPITAL_COMMUNITY): Payer: Self-pay | Admitting: *Deleted

## 2015-09-04 ENCOUNTER — Emergency Department (HOSPITAL_COMMUNITY)
Admission: EM | Admit: 2015-09-04 | Discharge: 2015-09-04 | Disposition: A | Discharge status: Home / Self Care | Payer: Medicare Other | Attending: Emergency Medicine | Admitting: Emergency Medicine

## 2015-09-04 ENCOUNTER — Emergency Department (HOSPITAL_COMMUNITY): Payer: Medicare Other

## 2015-09-04 DIAGNOSIS — I252 Old myocardial infarction: Secondary | ICD-10-CM | POA: Insufficient documentation

## 2015-09-04 DIAGNOSIS — Z853 Personal history of malignant neoplasm of breast: Secondary | ICD-10-CM | POA: Insufficient documentation

## 2015-09-04 DIAGNOSIS — Z87891 Personal history of nicotine dependence: Secondary | ICD-10-CM | POA: Diagnosis not present

## 2015-09-04 DIAGNOSIS — R0600 Dyspnea, unspecified: Secondary | ICD-10-CM | POA: Insufficient documentation

## 2015-09-04 DIAGNOSIS — E119 Type 2 diabetes mellitus without complications: Secondary | ICD-10-CM | POA: Diagnosis not present

## 2015-09-04 DIAGNOSIS — I10 Essential (primary) hypertension: Secondary | ICD-10-CM | POA: Diagnosis not present

## 2015-09-04 DIAGNOSIS — R0602 Shortness of breath: Secondary | ICD-10-CM | POA: Diagnosis present

## 2015-09-04 DIAGNOSIS — Z7982 Long term (current) use of aspirin: Secondary | ICD-10-CM | POA: Insufficient documentation

## 2015-09-04 DIAGNOSIS — R55 Syncope and collapse: Secondary | ICD-10-CM | POA: Diagnosis not present

## 2015-09-04 DIAGNOSIS — Z79899 Other long term (current) drug therapy: Secondary | ICD-10-CM | POA: Diagnosis not present

## 2015-09-04 DIAGNOSIS — R42 Dizziness and giddiness: Secondary | ICD-10-CM | POA: Diagnosis not present

## 2015-09-04 DIAGNOSIS — I2510 Atherosclerotic heart disease of native coronary artery without angina pectoris: Secondary | ICD-10-CM | POA: Diagnosis not present

## 2015-09-04 LAB — URINE MICROSCOPIC-ADD ON
Bacteria, UA: NONE SEEN
RBC / HPF: NONE SEEN RBC/hpf (ref 0–5)
Squamous Epithelial / LPF: NONE SEEN
WBC, UA: NONE SEEN WBC/hpf (ref 0–5)

## 2015-09-04 LAB — BASIC METABOLIC PANEL
ANION GAP: 8 (ref 5–15)
BUN: 28 mg/dL — AB (ref 6–20)
CALCIUM: 9.5 mg/dL (ref 8.9–10.3)
CO2: 28 mmol/L (ref 22–32)
CREATININE: 1.44 mg/dL — AB (ref 0.44–1.00)
Chloride: 98 mmol/L — ABNORMAL LOW (ref 101–111)
GFR calc Af Amer: 38 mL/min — ABNORMAL LOW (ref >60)
GFR, EST NON AFRICAN AMERICAN: 33 mL/min — AB (ref >60)
GLUCOSE: 129 mg/dL — AB (ref 65–99)
Potassium: 4.6 mmol/L (ref 3.5–5.1)
Sodium: 134 mmol/L — ABNORMAL LOW (ref 135–145)

## 2015-09-04 LAB — URINALYSIS, ROUTINE W REFLEX MICROSCOPIC
Bilirubin Urine: NEGATIVE
Glucose, UA: NEGATIVE mg/dL
Hgb urine dipstick: NEGATIVE
Ketones, ur: NEGATIVE mg/dL
LEUKOCYTES UA: NEGATIVE
NITRITE: NEGATIVE
PH: 7 (ref 5.0–8.0)
Protein, ur: 100 mg/dL — AB
SPECIFIC GRAVITY, URINE: 1.021 (ref 1.005–1.030)

## 2015-09-04 LAB — CBC
HEMATOCRIT: 34.1 % — AB (ref 36.0–46.0)
HEMOGLOBIN: 11.5 g/dL — AB (ref 12.0–15.0)
MCH: 28.7 pg (ref 26.0–34.0)
MCHC: 33.7 g/dL (ref 30.0–36.0)
MCV: 85 fL (ref 78.0–100.0)
Platelets: 296 10*3/uL (ref 150–400)
RBC: 4.01 MIL/uL (ref 3.87–5.11)
RDW: 14.4 % (ref 11.5–15.5)
WBC: 7.4 10*3/uL (ref 4.0–10.5)

## 2015-09-04 LAB — I-STAT TROPONIN, ED
TROPONIN I, POC: 0.02 ng/mL (ref 0.00–0.08)
TROPONIN I, POC: 0 ng/mL (ref 0.00–0.08)

## 2015-09-04 LAB — CBG MONITORING, ED: Glucose-Capillary: 125 mg/dL — ABNORMAL HIGH (ref 65–99)

## 2015-09-04 LAB — BRAIN NATRIURETIC PEPTIDE: B Natriuretic Peptide: 400.7 pg/mL — ABNORMAL HIGH (ref 0.0–100.0)

## 2015-09-04 LAB — D-DIMER, QUANTITATIVE (NOT AT ARMC): D DIMER QUANT: 0.53 ug{FEU}/mL — AB (ref 0.00–0.50)

## 2015-09-04 NOTE — Discharge Instructions (Signed)
Dizziness °Dizziness is a common problem. It is a feeling of unsteadiness or light-headedness. You may feel like you are about to faint. Dizziness can lead to injury if you stumble or fall. Anyone can become dizzy, but dizziness is more common in older adults. This condition can be caused by a number of things, including medicines, dehydration, or illness. °HOME CARE INSTRUCTIONS °Taking these steps may help with your condition: °Eating and Drinking °· Drink enough fluid to keep your urine clear or pale yellow. This helps to keep you from becoming dehydrated. Try to drink more clear fluids, such as water. °· Do not drink alcohol. °· Limit your caffeine intake if directed by your health care provider. °· Limit your salt intake if directed by your health care provider. °Activity °· Avoid making quick movements. °¨ Rise slowly from chairs and steady yourself until you feel okay. °¨ In the morning, first sit up on the side of the bed. When you feel okay, stand slowly while you hold onto something until you know that your balance is fine. °· Move your legs often if you need to stand in one place for a long time. Tighten and relax your muscles in your legs while you are standing. °· Do not drive or operate heavy machinery if you feel dizzy. °· Avoid bending down if you feel dizzy. Place items in your home so that they are easy for you to reach without leaning over. °Lifestyle °· Do not use any tobacco products, including cigarettes, chewing tobacco, or electronic cigarettes. If you need help quitting, ask your health care provider. °· Try to reduce your stress level, such as with yoga or meditation. Talk with your health care provider if you need help. °General Instructions °· Watch your dizziness for any changes. °· Take medicines only as directed by your health care provider. Talk with your health care provider if you think that your dizziness is caused by a medicine that you are taking. °· Tell a friend or a family  member that you are feeling dizzy. If he or she notices any changes in your behavior, have this person call your health care provider. °· Keep all follow-up visits as directed by your health care provider. This is important. °SEEK MEDICAL CARE IF: °· Your dizziness does not go away. °· Your dizziness or light-headedness gets worse. °· You feel nauseous. °· You have reduced hearing. °· You have new symptoms. °· You are unsteady on your feet or you feel like the room is spinning. °SEEK IMMEDIATE MEDICAL CARE IF: °· You vomit or have diarrhea and are unable to eat or drink anything. °· You have problems talking, walking, swallowing, or using your arms, hands, or legs. °· You feel generally weak. °· You are not thinking clearly or you have trouble forming sentences. It may take a friend or family member to notice this. °· You have chest pain, abdominal pain, shortness of breath, or sweating. °· Your vision changes. °· You notice any bleeding. °· You have a headache. °· You have neck pain or a stiff neck. °· You have a fever. °  °This information is not intended to replace advice given to you by your health care provider. Make sure you discuss any questions you have with your health care provider. °  °Document Released: 09/24/2000 Document Revised: 08/15/2014 Document Reviewed: 03/27/2014 °Elsevier Interactive Patient Education ©2016 [employer] °Shortness of Breath °Shortness of breath means you have trouble breathing. It could also mean that you have a   medical problem. You should get immediate medical care for shortness of breath. °CAUSES  °· Not enough oxygen in the air such as with high altitudes or a smoke-filled room. °· Certain lung diseases, infections, or problems. °· Heart disease or conditions, such as angina or heart failure. °· Low red blood cells (anemia). °· Poor physical fitness, which can cause shortness of breath when you exercise. °· Chest or back injuries or stiffness. °· Being  overweight. °· Smoking. °· Anxiety, which can make you feel like you are not getting enough air. °DIAGNOSIS  °Serious medical problems can often be found during your physical exam. Tests may also be done to determine why you are having shortness of breath. Tests may include: °· Chest X-rays. °· Lung function tests. °· Blood tests. °· An electrocardiogram (ECG). °· An ambulatory electrocardiogram. An ambulatory ECG records your heartbeat patterns over a 24-hour period. °· Exercise testing. °· A transthoracic echocardiogram (TTE). During echocardiography, sound waves are used to evaluate how blood flows through your heart. °· A transesophageal echocardiogram (TEE). °· Imaging scans. °Your health care provider may not be able to find a cause for your shortness of breath after your exam. In this case, it is important to have a follow-up exam with your health care provider as directed.  °TREATMENT  °Treatment for shortness of breath depends on the cause of your symptoms and can vary greatly. °HOME CARE INSTRUCTIONS  °· Do not smoke. Smoking is a common cause of shortness of breath. If you smoke, ask for help to quit. °· Avoid being around chemicals or things that may bother your breathing, such as paint fumes and dust. °· Rest as needed. Slowly resume your usual activities. °· If medicines were prescribed, take them as directed for the full length of time directed. This includes oxygen and any inhaled medicines. °· Keep all follow-up appointments as directed by your health care provider. °SEEK MEDICAL CARE IF:  °· Your condition does not improve in the time expected. °· You have a hard time doing your normal activities even with rest. °· You have any new symptoms. °SEEK IMMEDIATE MEDICAL CARE IF:  °· Your shortness of breath gets worse. °· You feel light-headed, faint, or develop a cough not controlled with medicines. °· You start coughing up blood. °· You have pain with breathing. °· You have chest pain or pain in your  arms, shoulders, or abdomen. °· You have a fever. °· You are unable to walk up stairs or exercise the way you normally do. °MAKE SURE YOU: °· Understand these instructions. °· Will watch your condition. °· Will get help right away if you are not doing well or get worse. °  °This information is not intended to replace advice given to you by your health care provider. Make sure you discuss any questions you have with your health care provider. °  °Document Released: 12/24/2000 Document Revised: 04/05/2013 Document Reviewed: 06/16/2011 °Elsevier Interactive Patient Education ©2016 [employer] ° °

## 2015-09-04 NOTE — ED Notes (Addendum)
PT aware of urine sample 

## 2015-09-04 NOTE — ED Provider Notes (Signed)
CSN: [id number]     Arrival date & time 09/04/15  1318 History   First MD Initiated Contact with Patient 09/04/15 1322     Chief Complaint  Patient presents with  . Near Syncope  . Shortness of Breath     (Consider location/radiation/quality/duration/timing/severity/associated sxs/prior Treatment) HPI Patient presents with acute onset shortness of breath and near syncope. Started roughly 10 minutes prior to coming to the emergency department. Patient had just eaten at [facility] and was in the car with her husband when symptoms started. Denied chest pain or pressure at any time. States she got "hot and clammy" and was concerned she was having heart attack. Denies having any shortness of breath or diaphoresis at this point. No new lower extremity swelling or pain. Patient has history of coronary artery disease and is a patient of Dr. Pernell Dupre. She woke up this morning and states that her systolic blood pressure was greater than 200. She took half of her carvedilol as instructed to do by her primary doctor when having elevated blood pressure. No other new medications Past Medical History  Diagnosis Date  . CAD (coronary artery disease)   . Renal artery stenosis ([place])   . HTN (hypertension)   . Dyslipidemia   . GERD (gastroesophageal reflux disease)   . Osteoporosis   . Meniere disease     "for 20 years" takes Antivert 3 times per day  . Complication of anesthesia     "almost died when I had my breast reduction"  . Heart murmur   . Myocardial infarction ([place]) 2007  . History of hiatal hernia   . Type II diabetes mellitus ([place])     "controlled w/diet and exercise only" (02/26/2015)  . Hyperthyroidism    Past Surgical History  Procedure Laterality Date  . Hip fracture surgery Right 2011    "added rod in my leg and a ball"  . Wrist fracture surgery Left 2012  . Cholecystectomy    . Bladder suspension  x3  . Anterior cervical decomp/discectomy fusion  02/2002    Archie Endo 08/26/2010  .  Reduction mammaplasty Bilateral 1985  . Renal artery stent Left 2012  . Peripheral vascular catheterization Left 12/20/2014    Procedure: Renal Angiography;  Surgeon: Wellington Hampshire, MD;  Location: [facility];  Service: Cardiovascular;  Laterality: Left;  . Vaginal hysterectomy  ~ 1995  . Fracture surgery    . Back surgery    . Breast lumpectomy  11/2007    breast needle-localized lumpectomy/notes 08/12/2012  . Neck hardware removal  11/2002    Archie Endo 08/26/2010  . Cataract extraction w/ intraocular lens  implant, bilateral Bilateral   . Cardiac catheterization  2007    "couldn't get stents in; I almost died'  . Kyphoplasty N/A April 04, 2015    Procedure: THORACIC TWELVE KYPHOPLASTY;  Surgeon: Jovita Gamma, MD;  Location: [facility];  Service: Neurosurgery;  Laterality: N/A;  . Tonsillectomy    . Appendectomy    . Kyphoplasty N/A 04/23/2015    Procedure: KYPHOPLASTY - LUMBAR ONE;  Surgeon: Jovita Gamma, MD;  Location: [facility];  Service: Neurosurgery;  Laterality: N/A;  L1 Kyphoplasty   Family History  Problem Relation Age of Onset  . Diabetes type II Mother   . Heart attack Mother   . Heart disease Mother   . Congestive Heart Failure Father   . Pulmonary embolism Father   . Alzheimer's disease Sister   . Heart disease Sister   . Heart  disease Brother   . Healthy Sister   . Healthy Sister   . Stroke Brother   . Dementia Brother     VASCULAR  . Stroke Brother   . Diabetes type II Brother   . Other Brother     KILLED IN PLANE CRASH  . Pulmonary disease Sister   . Heart attack Sister   . Kidney failure Sister   . Diabetes type II Sister   . Diabetes type II Sister   . Kidney failure Sister    Social History  Substance Use Topics  . Smoking status: Former Smoker -- 1.00 packs/day for 30 years    Types: Cigarettes  . Smokeless tobacco: Never Used     Comment: "quit smoking in 1989"  . Alcohol Use: No   OB History    No data available     Review of  Systems  Constitutional: Positive for diaphoresis. Negative for fever and chills.  Respiratory: Positive for shortness of breath. Negative for cough, chest tightness and wheezing.   Cardiovascular: Negative for chest pain, palpitations and leg swelling.  Gastrointestinal: Negative for nausea, vomiting, abdominal pain and diarrhea.  Musculoskeletal: Negative for myalgias, back pain, neck pain and neck stiffness.  Skin: Negative for rash and wound.  Neurological: Positive for dizziness and light-headedness. Negative for syncope, weakness, numbness and headaches.  All other systems reviewed and are negative.     Allergies  Fentanyl; Fish allergy; Morphine and related; Singulair; Actonel; Dilaudid; Fosamax; Hydralazine; Lipitor; Lisinopril; Micardis; Septra; Tekturna; Tricor; Amlodipine; Avapro; Cardio complete; Cardizem; Ciprofloxacin; Codeine; Cyclobenzaprine; Forteo; Inapsine; Ivp dye; Shellfish allergy; and Tussionex pennkinetic er  Home Medications   Prior to Admission medications   Medication Sig Start Date End Date Taking? Authorizing Provider  aspirin 325 MG tablet Take 325 mg by mouth at bedtime.   Yes Historical Provider, MD  betamethasone dipropionate (DIPROLENE) 0.05 % cream Apply 1 application topically daily as needed (irritated skin).   Yes Historical Provider, MD  Calcium Citrate (CITRACAL PO) Take 1,200 mg by mouth daily.   Yes Historical Provider, MD  carvedilol (COREG) 25 MG tablet Take 25 mg by mouth 2 (two) times daily with a meal. And pt may take another 12.5mg  if needed when BP goes over 210   Yes Historical Provider, MD  cholecalciferol (VITAMIN D) 1000 UNITS tablet Take 2,000 Units by mouth daily.    Yes Historical Provider, MD  cloNIDine (CATAPRES) 0.1 MG tablet Take 1 tablet (0.1 mg total) by mouth 3 (three) times daily. 03/11/15  Yes Kelvin Cellar, MD  [facility] Liver Oil CAPS Take 1 capsule by mouth daily.   Yes Historical Provider, MD  denosumab (PROLIA) 60 MG/ML SOLN  injection Inject 60 mg into the skin every 6 (six) months. Administer in upper arm, thigh, or abdomen   Yes Historical Provider, MD  docusate sodium (COLACE) 100 MG capsule Take 2 capsules (200 mg total) by mouth 2 (two) times daily. Patient taking differently: Take 200 mg by mouth daily as needed for moderate constipation.  02/28/15  Yes Thurnell Lose, MD  esomeprazole (NEXIUM) 40 MG capsule Take 80 mg by mouth 2 (two) times daily.    Yes Historical Provider, MD  fexofenadine (ALLEGRA) 180 MG tablet Take 180 mg by mouth daily.   Yes Historical Provider, MD  meclizine (ANTIVERT) 25 MG tablet Take 25 mg by mouth 3 (three) times daily.    Yes Historical Provider, MD  methimazole (TAPAZOLE) 10 MG tablet Take 1 tablet (10 mg total)  by mouth 2 (two) times daily. Patient taking differently: Take 10 mg by mouth daily.  03/11/15  Yes Kelvin Cellar, MD  Multiple Vitamins-Minerals (PRESERVISION AREDS) CAPS Take 1 capsule by mouth once a week.    Yes Historical Provider, MD  nitroGLYCERIN (NITROSTAT) 0.4 MG SL tablet Place 0.4 mg under the tongue every 5 (five) minutes as needed for chest pain (3 doses MAX).   Yes Historical Provider, MD  olmesartan (BENICAR) 40 MG tablet Take 20 mg by mouth 2 (two) times daily.    Yes Historical Provider, MD  PREMARIN vaginal cream Place 1-2 g vaginally 2 (two) times a week. 10/06/14  Yes Historical Provider, MD  simvastatin (ZOCOR) 20 MG tablet Take 20 mg by mouth every Monday, Wednesday, and Friday.  10/14/11  Yes Historical Provider, MD  VITAMIN E PO Take 1 capsule by mouth daily.   Yes Historical Provider, MD  diclofenac sodium (VOLTAREN) 1 % GEL Apply 4 g topically 4 (four) times daily as needed (pain). Patient not taking: Reported on 04/18/2015 02/28/15   Thurnell Lose, MD  oxyCODONE-acetaminophen (PERCOCET/ROXICET) 5-325 MG tablet Take 1-2 tablets by mouth every 4 (four) hours as needed (pain). Patient not taking: Reported on 09/04/2015 04/23/15   Jovita Gamma, MD   triamterene-hydrochlorothiazide (MAXZIDE) 75-50 MG tablet Take 0.5 tablets by mouth daily. Reported on 09/04/2015    Historical Provider, MD   BP 151/68 mmHg  Pulse 65  Temp(Src) 97.7 F (36.5 C) (Oral)  Resp 15  SpO2 95% Physical Exam  Constitutional: She is oriented to person, place, and time. She appears well-developed and well-nourished. No distress.  HENT:  Head: Normocephalic and atraumatic.  Mouth/Throat: Oropharynx is clear and moist. No oropharyngeal exudate.  Eyes: EOM are normal. Pupils are equal, round, and reactive to light.  No nystagmus  Neck: Normal range of motion. Neck supple. No JVD present.  Cardiovascular: Normal rate and regular rhythm.   Murmur heard. Harsh systolic murmur  Pulmonary/Chest: Effort normal and breath sounds normal. No respiratory distress. She has no wheezes. She has no rales. She exhibits no tenderness.  Abdominal: Soft. Bowel sounds are normal. She exhibits no distension and no mass. There is no tenderness. There is no rebound and no guarding.  Musculoskeletal: Normal range of motion. She exhibits no edema or tenderness.  No lower extremity swelling, asymmetry or tenderness. Distal pulses are equal and intact.  Neurological: She is alert and oriented to person, place, and time.  5/5 motor in all extremities. Sensation is fully intact. Cranial nerves II through XII grossly intact.  Skin: Skin is warm and dry. No rash noted. No erythema.  Psychiatric: She has a normal mood and affect. Her behavior is normal.  Nursing note and vitals reviewed.   ED Course  Procedures (including critical care time) Labs Review Labs Reviewed  BASIC METABOLIC PANEL - Abnormal; Notable for the following:    Sodium 134 (*)    Chloride 98 (*)    Glucose, Bld 129 (*)    BUN 28 (*)    Creatinine, Ser 1.44 (*)    GFR calc non Af Amer 33 (*)    GFR calc Af Amer 38 (*)    All other components within normal limits  CBC - Abnormal; Notable for the following:     Hemoglobin 11.5 (*)    HCT 34.1 (*)    All other components within normal limits  BRAIN NATRIURETIC PEPTIDE - Abnormal; Notable for the following:    B Natriuretic Peptide 400.7 (*)  All other components within normal limits  D-DIMER, QUANTITATIVE (NOT AT [facility]) - Abnormal; Notable for the following:    D-Dimer, Quant 0.53 (*)    All other components within normal limits  CBG MONITORING, ED - Abnormal; Notable for the following:    Glucose-Capillary 125 (*)    All other components within normal limits  URINALYSIS, ROUTINE W REFLEX MICROSCOPIC (NOT AT [facility])  Randolm Idol, ED  Randolm Idol, ED    Imaging Review Dg Chest Port 1 View  09/04/2015  CLINICAL DATA:  Dizziness, shortness of breath EXAM: PORTABLE CHEST 1 VIEW COMPARISON:  08/17/15 FINDINGS: The heart size and mediastinal contours are within normal limits. Both lungs are clear. The visualized skeletal structures are unremarkable. IMPRESSION: No active disease. Electronically Signed   By: Inez Catalina M.D.   On: 09/04/2015 14:09   I have personally reviewed and evaluated these images and lab results as part of my medical decision-making.   EKG Interpretation   Date/Time:  Tuesday Sep 04 2015 13:21:17 EDT Ventricular Rate:  58 PR Interval:  175 QRS Duration: 95 QT Interval:  424 QTC Calculation: 416 R Axis:   66 Text Interpretation:  Sinus rhythm Probable LVH with secondary repol abnrm  Baseline wander in lead(s) III Confirmed by Lita Mains  MD, Cannon Arreola ([id number])  on 09/04/2015 1:47:14 PM      MDM   Final diagnoses:  Dyspnea  Lightheadedness    Discussed with Dr. [name] who reviewed the patient's second EKG. He does not believe meets STEMI criteria. Patient is symptom-free except for lightheadedness at this point. We'll continue workup.   Patient states she is feeling much better. No shortness of breath or lightheadedness. Age-adjusted d-dimer is normal. Initial troponin is normal. We'll get repeat troponin. The  patient is to be asymptomatic and normal vital signs, likely have patient follow-up with her primary physician and cardiology as an outpatient.   Repeat troponin is normal. We'll discharge home to follow-up with her cardiologist and return precautions.  Julianne Rice, MD 09/04/15 [phone]

## 2015-09-04 NOTE — ED Notes (Signed)
Pt reports mild pain in her chest which started last week.  Reports today, denies any pain in her chest but reports near syncope and SOB.  Pt is A&O x 4.  Pt has hx of MI in 07/17/05, states "I almost died."

## 2015-09-10 NOTE — Progress Notes (Signed)
Cardiology Office Note   Date:  09/11/2015   ID:  [name], DOB 1931-09-05, MRN [id number]  PCP:  Irven Shelling, MD  Cardiologist:  Dr. Tamala Julian   PV Dr. Fletcher Anon  Chief Complaint  Patient presents with  . Hospitalization Follow-up    "going up and down" BP       History of Present Illness: [name] is a 82 y.o. female who presents for post ER visit for SOB and near syncope.  States she got "hot and clammy" and was concerned she was having heart attack. Denies having any shortness of breath or diaphoresis at this point. No new lower extremity swelling or pain.  She woke up the morning of ER visit and stated that her systolic blood pressure was greater than 200. She took half of her carvedilol as instructed to do by her primary doctor when having elevated blood pressure.  She has a hx of refractory hypertension and renal artery stenosis. She has known history of coronary artery disease with chronic right coronary total occlusion, and LAD disease.  history of renal artery stenosis, renal vascular hypertension with difficult to control blood pressure, multiple medication intolerances.  She had left renal artery stent placement in 2012 by Dr. Irish Lack.   Last duplex with  left renal artery stent was found to be recently occluded. This probably has been occluded for few months. Thus, there is probably unkown benefit from revascularization. However, recent renal ultrasound showed left renal size to be reasonable at 9 cm which might indicate some viability. PV angio of renal arteries as above and patent rt renal artery.    Biggest issue is intolerance to multiple medications. She tolerates Clonidine and thus I decided to change this to 0.1 mg bid instead of prn.  If not improvement with BP, Dr. Fletcher Anon noted he ight consider revascularization of left renal artery occlusion after addressing the mass noted on posterior bladder wall.   Last echo 02/2015 with EF 60-65% G1DD,  Aortic  valve with mild stenosis,  mitral valve with calcified annulus.   Today she continues with the near syncope up to 5 times a day.  BP is elevated in the AM but once on meds is more stable. She denies any rapid HR.  Dr. Laurann Montana wanted her to have repeat echo to eval her murmur.  No chest pain.  No SOB.  She is planning a move to [facility] July 7.     Past Medical History  Diagnosis Date  . CAD (coronary artery disease)   . Renal artery stenosis ([place])   . HTN (hypertension)   . Dyslipidemia   . GERD (gastroesophageal reflux disease)   . Osteoporosis   . Meniere disease     "for 20 years" takes Antivert 3 times per day  . Complication of anesthesia     "almost died when I had my breast reduction"  . Heart murmur   . Myocardial infarction ([place]) 2007  . History of hiatal hernia   . Type II diabetes mellitus ([place])     "controlled w/diet and exercise only" (02/26/2015)  . Hyperthyroidism     Past Surgical History  Procedure Laterality Date  . Hip fracture surgery Right 2011    "added rod in my leg and a ball"  . Wrist fracture surgery Left 2012  . Cholecystectomy    . Bladder suspension  x3  . Anterior cervical decomp/discectomy fusion  02/2002    Archie Endo 08/26/2010  . Reduction  mammaplasty Bilateral 1985  . Renal artery stent Left 2012  . Peripheral vascular catheterization Left 12/20/2014    Procedure: Renal Angiography;  Surgeon: Wellington Hampshire, MD;  Location: [facility];  Service: Cardiovascular;  Laterality: Left;  . Vaginal hysterectomy  ~ 1995  . Fracture surgery    . Back surgery    . Breast lumpectomy  11/2007    breast needle-localized lumpectomy/notes 08/12/2012  . Neck hardware removal  11/2002    Archie Endo 08/26/2010  . Cataract extraction w/ intraocular lens  implant, bilateral Bilateral   . Cardiac catheterization  2007    "couldn't get stents in; I almost died'  . Kyphoplasty N/A 04/17/15    Procedure: THORACIC TWELVE KYPHOPLASTY;  Surgeon: Jovita Gamma, MD;  Location: [facility];  Service: Neurosurgery;  Laterality: N/A;  . Tonsillectomy    . Appendectomy    . Kyphoplasty N/A 04/23/2015    Procedure: KYPHOPLASTY - LUMBAR ONE;  Surgeon: Jovita Gamma, MD;  Location: [facility];  Service: Neurosurgery;  Laterality: N/A;  L1 Kyphoplasty     Current Outpatient Prescriptions  Medication Sig Dispense Refill  . aspirin 325 MG tablet Take 325 mg by mouth at bedtime.    . betamethasone dipropionate (DIPROLENE) 0.05 % cream Apply 1 application topically daily as needed (irritated skin).    . Calcium Citrate (CITRACAL PO) Take 1,200 mg by mouth daily.    . carvedilol (COREG) 25 MG tablet Take 25 mg by mouth 2 (two) times daily with a meal. And pt may take another 12.5mg  if needed when BP goes over 210    . cholecalciferol (VITAMIN D) 1000 UNITS tablet Take 2,000 Units by mouth daily.     . cloNIDine (CATAPRES) 0.1 MG tablet Take 1 tablet (0.1 mg total) by mouth 3 (three) times daily. 60 tablet 11  . Cod Liver Oil CAPS Take 1 capsule by mouth daily.    [name] denosumab (PROLIA) 60 MG/ML SOLN injection Inject 60 mg into the skin every 6 (six) months. Administer in upper arm, thigh, or abdomen    . docusate sodium (COLACE) 100 MG capsule Take 100 mg by mouth daily as needed for mild constipation.    [name] esomeprazole (NEXIUM) 40 MG capsule Take 80 mg by mouth 2 (two) times daily.     . fexofenadine (ALLEGRA) 180 MG tablet Take 180 mg by mouth daily.    . meclizine (ANTIVERT) 25 MG tablet Take 25 mg by mouth 3 (three) times daily.     . methimazole (TAPAZOLE) 5 MG tablet Take 5 mg by mouth daily.  11  . Multiple Vitamins-Minerals (PRESERVISION AREDS) CAPS Take 1 capsule by mouth once a week.     . nitroGLYCERIN (NITROSTAT) 0.4 MG SL tablet Place 0.4 mg under the tongue every 5 (five) minutes as needed for chest pain (3 doses MAX).    [name] olmesartan (BENICAR) 40 MG tablet Take 20 mg by mouth 2 (two) times daily.     [name] PREMARIN vaginal cream Place 1-2  g vaginally 2 (two) times a week.  4  . simvastatin (ZOCOR) 20 MG tablet Take 20 mg by mouth every Monday, Wednesday, and Friday.     [name] VITAMIN E PO Take 1 capsule by mouth daily.     No current facility-administered medications for this visit.    Allergies:   Fentanyl; Fish allergy; Morphine and related; Singulair; Actonel; Dilaudid; Fosamax; Hydralazine; Lipitor; Lisinopril; Micardis; Septra; Tekturna; Tricor; Amlodipine; Avapro; Cardio complete; Cardizem; Ciprofloxacin;  Codeine; Cyclobenzaprine; Forteo; Inapsine; Ivp dye; Shellfish allergy; and Tussionex pennkinetic er    Social History:  The patient  reports that she has quit smoking. Her smoking use included Cigarettes. She has a 30 pack-year smoking history. She has never used smokeless tobacco. She reports that she does not drink alcohol or use illicit drugs.   Family History:  The patient's family history includes Alzheimer's disease in her sister; Congestive Heart Failure in her father; Dementia in her brother; Diabetes type II in her brother, mother, sister, and sister; Healthy in her sister and sister; Heart attack in her mother and sister; Heart disease in her brother, mother, and sister; Kidney failure in her sister and sister; Other in her brother; Pulmonary disease in her sister; Pulmonary embolism in her father; Stroke in her brother and brother.    ROS:  General:no colds or fevers, no weight changes Skin:no rashes or ulcers HEENT:no blurred vision, no congestion CV:see HPI PUL:see HPI GI:no diarrhea constipation or melena, no indigestion GU:no hematuria, no dysuria MS:no joint pain, no claudication Neuro:+ near syncope as above, no lightheadedness Endo:no diabetes, no thyroid disease  Wt Readings from Last 3 Encounters:  09/11/15 110 lb 12.8 oz (50.259 kg)  04/18/15 106 lb 4.8 oz (48.217 kg)  03/20/15 113 lb 14.4 oz (51.665 kg)     PHYSICAL EXAM: VS:  BP 150/60 mmHg  Pulse 64  Ht 5\' 1"  (1.549 m)  Wt 110 lb 12.8 oz  (50.259 kg)  BMI 20.95 kg/m2 , BMI Body mass index is 20.95 kg/(m^2). General:Pleasant affect, NAD Skin:Warm and dry, brisk capillary refill HEENT:normocephalic, sclera clear, mucus membranes moist Neck:supple, no JVD, + bil bruits  Heart:S1S2 RRR with 3/6 systolic murmur aortic and mitral. no gallup, rub or click Lungs:clear without rales, rhonchi, or wheezes [id number], non tender, + BS, do not palpate liver spleen or masses Ext:no lower ext edema, 1+ pedal pulses, 2+ radial pulses Neuro:alert and oriented, MAE, follows commands, + facial symmetry    EKG:  EKG is NOT ordered today. The ekg from ER reviewed no acute changes.   Recent Labs: 03/08/2015: ALT 13*; TSH 0.040* 03/10/2015: Magnesium 1.9 09/04/2015: B Natriuretic Peptide 400.7*; BUN 28*; Creatinine, Ser 1.44*; Hemoglobin 11.5*; Platelets 296; Potassium 4.6; Sodium 134*    Lipid Panel    Component Value Date/Time   CHOL  05/10/2008 0635    177        ATP III CLASSIFICATION:  <200     mg/dL   Desirable  200-239  mg/dL   Borderline High  >=240    mg/dL   High          TRIG 546* 05/10/2008 0635   HDL 31* 05/10/2008 0635   CHOLHDL 5.7 05/10/2008 0635   VLDL UNABLE TO CALCULATE IF TRIGLYCERIDE OVER 400 mg/dL 05/10/2008 0635   LDLCALC  05/10/2008 0635    UNABLE TO CALCULATE IF TRIGLYCERIDE OVER 400 mg/dL        Total Cholesterol/HDL:CHD Risk Coronary Heart Disease Risk Table                     Men   Women  1/2 Average Risk   3.4   3.3  Average Risk       5.0   4.4  2 X Average Risk   9.6   7.1  3 X Average Risk  23.4   11.0        Use the calculated Patient Ratio above and the CHD Risk Table  to determine the patient's CHD Risk.        ATP III CLASSIFICATION (LDL):  <100     mg/dL   Optimal  100-129  mg/dL   Near or Above                    Optimal  130-159  mg/dL   Borderline  160-189  mg/dL   High  >190     mg/dL   Very High       Other studies Reviewed: Additional studies/ records that were reviewed  today include: . Echo 03/08/16  Study Conclusions  - Left ventricle: The cavity size was normal. Wall thickness was  increased in a pattern of mild LVH. Systolic function was normal.  The estimated ejection fraction was in the range of 60% to 65%.  Wall motion was normal; there were no regional wall motion  abnormalities. Doppler parameters are consistent with abnormal  left ventricular relaxation (grade 1 diastolic dysfunction).  Doppler parameters are consistent with high ventricular filling  pressure. - Aortic valve: There was very mild stenosis. There was trivial  regurgitation. Valve area (VTI): 0.97 cm^2. Valve area (Vmax):  0.9 cm^2. Valve area (Vmean): 0.89 cm^2. - Mitral valve: Calcified annulus. Valve area by pressure  half-time: 2.47 cm^2. Valve area by continuity equation (using  LVOT flow): 1.16 cm^2.   ASSESSMENT AND PLAN:  1. Near syncope several times a day.  With carotid bruits will check carotids, and recheck echo.  She has known aortic and mitral disease. She will follow up with me psot test and with Dr. Tamala Julian in August though I will review results with Dr. Tamala Julian.   Discussed 24 hr. Monitor but will eval. The echo and dopplers first.   2.  Renovascular hypertension with chronic kidney disease: The left renal artery stent was found to be recently occluded. This probably has been occluded for few months. Thus, there is probably unkown benefit from revascularization. However, recent renal ultrasound showed left renal size to be reasonable at 9 cm which might indicate some viability.  Biggest issue is intolerance to multiple medications. She tolerates Clonidine and thus I decided to change this to 0.1 mg bid instead of prn.  If not improvement with BP, I might consider revascularization of left renal artery occlusion after addressing the mass noted on posterior bladder wall.   3. Bladder mass: has seen Dr. Gaynelle Arabian and had cysto he did not find any  abnormalities per pt.   4. Coronary artery disease involving native coronary artery of native heart without angina pectoris No chest pain no SOB  5. Hyperlipidemia On therapy and followed by primary care  6. Recent 2 back surgeries  Current medicines are reviewed with the patient today.  The patient Has no concerns regarding medicines.  The following changes have been made:  See above Labs/ tests ordered today include:see above  Disposition:   FU:  see above  Signed, Cecilie Kicks, NP  09/11/2015 9:37 AM    [address], [place], [place], [place] Phone: [phone]; Fax: [phone]

## 2015-09-11 ENCOUNTER — Ambulatory Visit (INDEPENDENT_AMBULATORY_CARE_PROVIDER_SITE_OTHER): Payer: Medicare Other | Admitting: Cardiology

## 2015-09-11 ENCOUNTER — Encounter: Payer: Self-pay | Admitting: Cardiology

## 2015-09-11 VITALS — BP 150/60 | HR 64 | Ht 61.0 in | Wt 110.8 lb

## 2015-09-11 DIAGNOSIS — R011 Cardiac murmur, unspecified: Secondary | ICD-10-CM | POA: Diagnosis not present

## 2015-09-11 DIAGNOSIS — R0989 Other specified symptoms and signs involving the circulatory and respiratory systems: Secondary | ICD-10-CM

## 2015-09-11 DIAGNOSIS — I701 Atherosclerosis of renal artery: Secondary | ICD-10-CM | POA: Diagnosis not present

## 2015-09-11 DIAGNOSIS — R55 Syncope and collapse: Secondary | ICD-10-CM | POA: Diagnosis not present

## 2015-09-11 DIAGNOSIS — I10 Essential (primary) hypertension: Secondary | ICD-10-CM

## 2015-09-11 DIAGNOSIS — I2510 Atherosclerotic heart disease of native coronary artery without angina pectoris: Secondary | ICD-10-CM

## 2015-09-11 DIAGNOSIS — E785 Hyperlipidemia, unspecified: Secondary | ICD-10-CM

## 2015-09-11 DIAGNOSIS — N183 Chronic kidney disease, stage 3 unspecified: Secondary | ICD-10-CM

## 2015-09-11 NOTE — Patient Instructions (Signed)
Medication Instructions:  None  Labwork: None  Testing/Procedures: Your physician has requested that you have a carotid duplex. This test is an ultrasound of the carotid arteries in your neck. It looks at blood flow through these arteries that supply the brain with blood. Allow one hour for this exam. There are no restrictions or special instructions.  Your physician has requested that you have an echocardiogram. Echocardiography is a painless test that uses sound waves to create images of your heart. It provides your doctor with information about the size and shape of your heart and how well your heart's chambers and valves are working. This procedure takes approximately one hour. There are no restrictions for this procedure.   Follow-Up: Your physician recommends that you schedule a follow-up appointment with PA or NP shortly after echo and carotid doppler are completed.  Your physician recommends that you schedule a follow-up appointment in: August with Dr. Tamala Julian.    Any Other Special Instructions Will Be Listed Below (If Applicable).     If you need a refill on your cardiac medications before your next appointment, please call your pharmacy.

## 2015-09-20 ENCOUNTER — Ambulatory Visit (HOSPITAL_COMMUNITY)
Admission: RE | Admit: 2015-09-20 | Discharge: 2015-09-20 | Disposition: A | Discharge status: Home / Self Care | Payer: Medicare Other | Source: Ambulatory Visit | Attending: Cardiology | Admitting: Cardiology

## 2015-09-20 DIAGNOSIS — I6523 Occlusion and stenosis of bilateral carotid arteries: Secondary | ICD-10-CM | POA: Insufficient documentation

## 2015-09-20 DIAGNOSIS — G458 Other transient cerebral ischemic attacks and related syndromes: Secondary | ICD-10-CM | POA: Insufficient documentation

## 2015-09-20 DIAGNOSIS — E119 Type 2 diabetes mellitus without complications: Secondary | ICD-10-CM | POA: Diagnosis not present

## 2015-09-20 DIAGNOSIS — E785 Hyperlipidemia, unspecified: Secondary | ICD-10-CM | POA: Diagnosis not present

## 2015-09-20 DIAGNOSIS — I2510 Atherosclerotic heart disease of native coronary artery without angina pectoris: Secondary | ICD-10-CM | POA: Diagnosis not present

## 2015-09-20 DIAGNOSIS — R0989 Other specified symptoms and signs involving the circulatory and respiratory systems: Secondary | ICD-10-CM | POA: Insufficient documentation

## 2015-09-20 DIAGNOSIS — I10 Essential (primary) hypertension: Secondary | ICD-10-CM | POA: Diagnosis not present

## 2015-09-20 DIAGNOSIS — K219 Gastro-esophageal reflux disease without esophagitis: Secondary | ICD-10-CM | POA: Insufficient documentation

## 2015-09-24 ENCOUNTER — Ambulatory Visit (HOSPITAL_COMMUNITY): Payer: Medicare Other | Attending: Cardiovascular Disease

## 2015-09-24 ENCOUNTER — Other Ambulatory Visit: Payer: Self-pay

## 2015-09-24 DIAGNOSIS — R011 Cardiac murmur, unspecified: Secondary | ICD-10-CM | POA: Diagnosis present

## 2015-09-24 DIAGNOSIS — R55 Syncope and collapse: Secondary | ICD-10-CM | POA: Insufficient documentation

## 2015-09-24 DIAGNOSIS — E785 Hyperlipidemia, unspecified: Secondary | ICD-10-CM | POA: Diagnosis not present

## 2015-09-24 DIAGNOSIS — I352 Nonrheumatic aortic (valve) stenosis with insufficiency: Secondary | ICD-10-CM | POA: Diagnosis not present

## 2015-09-24 DIAGNOSIS — I119 Hypertensive heart disease without heart failure: Secondary | ICD-10-CM | POA: Insufficient documentation

## 2015-09-24 DIAGNOSIS — I340 Nonrheumatic mitral (valve) insufficiency: Secondary | ICD-10-CM | POA: Diagnosis not present

## 2015-09-24 DIAGNOSIS — E119 Type 2 diabetes mellitus without complications: Secondary | ICD-10-CM | POA: Insufficient documentation

## 2015-09-24 LAB — ECHOCARDIOGRAM COMPLETE
AV Area VTI index: 1.18 cm2/m2
AV Area VTI: 1.61 cm2
AV Mean grad: 13 mmHg
AV Peak grad: 22 mmHg
AV VEL mean LVOT/AV: 0.48
AV peak Index: 1.1
AV pk vel: 234 cm/s
AVAREAMEANV: 1.67 cm2
AVAREAMEANVIN: 1.13 cm2/m2
Ao pk vel: 0.47 m/s
Area-P 1/2: 1.83 cm2
CHL CUP AV VEL: 1.74
CHL CUP LVOT MV VTI INDEX: 1.52 cm2/m2
CHL CUP LVOT MV VTI: 2.24
CHL CUP TV REG PEAK VELOCITY: 290 cm/s
DOP CAL AO MEAN VELOCITY: 168 cm/s
E decel time: 285 msec
E/e' ratio: 18.41
FS: 27 % — AB (ref 28–44)
IVS/LV PW RATIO, ED: 1.04
LA diam end sys: 37 mm
LA vol index: 38.1 mL/m2
LADIAMINDEX: 2.52 cm/m2
LASIZE: 37 mm
LAVOL: 56 mL
LAVOLA4C: 46 mL
LDCA: 3.46 cm2
LV E/e'average: 18.41
LV TDI E'MEDIAL: 3.51
LV e' LATERAL: 5.04 cm/s
LVEEMED: 18.41
LVOT SV: 103 mL
LVOT VTI: 29.8 cm
LVOT peak vel: 109 cm/s
LVOTD: 21 mm
LVOTVTI: 0.5 cm
MV Dec: 285
MV M vel: 86.6
MV Peak grad: 3 mmHg
MV pk A vel: 139 m/s
MVANNULUSVTI: 46 cm
MVPKEVEL: 92.8 m/s
MVSPHT: 95 ms
Mean grad: 4 mmHg
P 1/2 time: 315 ms
PW: 10.3 mm — AB (ref 0.6–1.1)
TDI e' lateral: 5.04
TR max vel: 290 cm/s
VTI: 59.4 cm
Valve area index: 1.18
Valve area: 1.74 cm2

## 2015-09-26 ENCOUNTER — Telehealth: Payer: Self-pay | Admitting: *Deleted

## 2015-09-26 DIAGNOSIS — R55 Syncope and collapse: Secondary | ICD-10-CM

## 2015-09-26 DIAGNOSIS — I2510 Atherosclerotic heart disease of native coronary artery without angina pectoris: Secondary | ICD-10-CM

## 2015-09-26 DIAGNOSIS — R931 Abnormal findings on diagnostic imaging of heart and coronary circulation: Secondary | ICD-10-CM

## 2015-09-26 NOTE — Telephone Encounter (Signed)
Pt has been made aware of her echo results. She is agreeable with the plan of 30 day event monitor and lexiscan myoview. Orders in EPIC. Pt will await for someone to call her to get everything scheduled.

## 2015-09-26 NOTE — Telephone Encounter (Signed)
-----   Message from Isaiah Serge, NP sent at 09/25/2015  9:06 PM EDT ----- Please tell pt her her ultrasound showed  Her heart is not pumping as well, so Dr. Tamala Julian wants a 30 day event monitor and lexiscan myoview.  Thanks.

## 2015-10-01 ENCOUNTER — Ambulatory Visit (INDEPENDENT_AMBULATORY_CARE_PROVIDER_SITE_OTHER): Payer: Medicare Other

## 2015-10-01 DIAGNOSIS — R55 Syncope and collapse: Secondary | ICD-10-CM | POA: Diagnosis not present

## 2015-10-01 DIAGNOSIS — I2510 Atherosclerotic heart disease of native coronary artery without angina pectoris: Secondary | ICD-10-CM

## 2015-10-04 ENCOUNTER — Ambulatory Visit (INDEPENDENT_AMBULATORY_CARE_PROVIDER_SITE_OTHER): Payer: Medicare Other | Admitting: Cardiology

## 2015-10-04 ENCOUNTER — Encounter: Payer: Self-pay | Admitting: Cardiology

## 2015-10-04 VITALS — BP 184/90 | HR 72 | Ht 61.0 in | Wt 111.2 lb

## 2015-10-04 DIAGNOSIS — I701 Atherosclerosis of renal artery: Secondary | ICD-10-CM | POA: Diagnosis not present

## 2015-10-04 DIAGNOSIS — R55 Syncope and collapse: Secondary | ICD-10-CM | POA: Diagnosis not present

## 2015-10-04 DIAGNOSIS — I2510 Atherosclerotic heart disease of native coronary artery without angina pectoris: Secondary | ICD-10-CM | POA: Diagnosis not present

## 2015-10-04 DIAGNOSIS — R931 Abnormal findings on diagnostic imaging of heart and coronary circulation: Secondary | ICD-10-CM

## 2015-10-04 DIAGNOSIS — I10 Essential (primary) hypertension: Secondary | ICD-10-CM

## 2015-10-04 MED ORDER — CLONIDINE HCL 0.1 MG PO TABS: ORAL_TABLET | ORAL | Status: DC

## 2015-10-04 NOTE — Patient Instructions (Addendum)
Medication Instructions:  INCREASE Clonidine to 0.2 mg at night  Labwork: None Ordered   Testing/Procedures: Your Lexiscan Myoview (chemical stress test) has been rescheduled for Wednesday July 12 at 9:45 am   Follow-Up: Your physician recommends that you keep your follow-up appointment with Dr. Tamala Julian for August   If you need a refill on your cardiac medications before your next appointment, please call your pharmacy.   Thank you for choosing [facility]! Christen Bame, RN [phone]

## 2015-10-04 NOTE — Progress Notes (Signed)
Cardiology Office Note   Date:  10/04/2015   ID:  [name], DOB 1931-07-25, MRN [id number]  PCP:  Irven Shelling, MD  Cardiologist:  DR. Tamala Julian    Chief Complaint  Patient presents with  . Loss of Consciousness    NO CP, SOB OR SWELLING. NO OTHER COMPLAINTS.      History of Present Illness: [name] is a 82 y.o. female who presents for syncope.  She believes this occurred when she took an extra half of coreg for BP at [id number] systolic.  Her EF had decreased and with hx of CAD we are arranging lexiscan myoview and she is wearing an event monitor for now.    BP continues to be elevated.   She has a hx of refractory hypertension and renal artery stenosis. She has known history of coronary artery disease with chronic right coronary total occlusion, and LAD disease. history of renal artery stenosis, renal vascular hypertension with difficult to control blood pressure, multiple medication intolerances. She had left renal artery stent placement in 2012 by Dr. Irish Lack.   Last duplex with left renal artery stent was found to be recently occluded. This probably has been occluded for few months. Thus, there is probably unkown benefit from revascularization. However, recent renal ultrasound showed left renal size to be reasonable at 9 cm which might indicate some viability. PV angio of renal arteries as above and patent rt renal artery.   Biggest issue is intolerance to multiple medications. She tolerates Clonidine and thus I decided to change this to 0.1 mg bid instead of prn.   If not improvement with BP, Dr. Fletcher Anon noted he might consider revascularization of left renal artery occlusion after addressing the mass noted on posterior bladder wall. She did have a cysto and no mass found per pt.  Last echo 02/2015 with EF 60-65% G1DD, Aortic valve with mild stenosis, mitral valve with calcified annulus.  Now with  EF 45-50%, diffuse hypokinesis.  Mild AS, mild MR, PA  peak  pressure: 37 mm Hg (S).   Past Medical History  Diagnosis Date  . CAD (coronary artery disease)   . Renal artery stenosis ([place])   . HTN (hypertension)   . Dyslipidemia   . GERD (gastroesophageal reflux disease)   . Osteoporosis   . Meniere disease     "for 20 years" takes Antivert 3 times per day  . Complication of anesthesia     "almost died when I had my breast reduction"  . Heart murmur   . Myocardial infarction ([place]) 2007  . History of hiatal hernia   . Type II diabetes mellitus ([place])     "controlled w/diet and exercise only" (02/26/2015)  . Hyperthyroidism     Past Surgical History  Procedure Laterality Date  . Hip fracture surgery Right 2011    "added rod in my leg and a ball"  . Wrist fracture surgery Left 2012  . Cholecystectomy    . Bladder suspension  x3  . Anterior cervical decomp/discectomy fusion  02/2002    Archie Endo 08/26/2010  . Reduction mammaplasty Bilateral 1985  . Renal artery stent Left 2012  . Peripheral vascular catheterization Left 12/20/2014    Procedure: Renal Angiography;  Surgeon: Wellington Hampshire, MD;  Location: [facility];  Service: Cardiovascular;  Laterality: Left;  . Vaginal hysterectomy  ~ 1995  . Fracture surgery    . Back surgery    . Breast lumpectomy  11/2007    breast  needle-localized lumpectomy/notes 08/12/2012  . Neck hardware removal  11/2002    [facility] Endo 08/26/2010  . Cataract extraction w/ intraocular lens  implant, bilateral Bilateral   . Cardiac catheterization  2007    "couldn't get stents in; I almost died'  . Kyphoplasty N/A 2015-04-03    Procedure: THORACIC TWELVE KYPHOPLASTY;  Surgeon: Jovita Gamma, MD;  Location: [facility];  Service: Neurosurgery;  Laterality: N/A;  . Tonsillectomy    . Appendectomy    . Kyphoplasty N/A 04/23/2015    Procedure: KYPHOPLASTY - LUMBAR ONE;  Surgeon: Jovita Gamma, MD;  Location: [facility];  Service: Neurosurgery;  Laterality: N/A;  L1 Kyphoplasty  . Renal arteriogram  12/2014     occluded previous Lt renal stent and patent rt renal artery.     Current Outpatient Prescriptions  Medication Sig Dispense Refill  . aspirin 325 MG tablet Take 325 mg by mouth at bedtime.    . betamethasone dipropionate (DIPROLENE) 0.05 % cream Apply 1 application topically daily as needed (irritated skin).    . Calcium Citrate (CITRACAL PO) Take 1,200 mg by mouth daily.    . carvedilol (COREG) 25 MG tablet Take 25 mg by mouth 2 (two) times daily with a meal. And pt may take another 12.5mg  if needed when BP goes over 210    . cholecalciferol (VITAMIN D) 1000 UNITS tablet Take 2,000 Units by mouth daily.     . cloNIDine (CATAPRES) 0.1 MG tablet Take 1 tablet (0.1 mg total) by mouth 3 (three) times daily. 60 tablet 11  . Cod Liver Oil CAPS Take 1 capsule by mouth daily.    [name] denosumab (PROLIA) 60 MG/ML SOLN injection Inject 60 mg into the skin every 6 (six) months. Administer in upper arm, thigh, or abdomen    . docusate sodium (COLACE) 100 MG capsule Take 100 mg by mouth daily as needed for mild constipation.    [name] esomeprazole (NEXIUM) 40 MG capsule Take 80 mg by mouth 2 (two) times daily.     . fexofenadine (ALLEGRA) 180 MG tablet Take 180 mg by mouth daily.    . meclizine (ANTIVERT) 25 MG tablet Take 25 mg by mouth 3 (three) times daily.     . methimazole (TAPAZOLE) 5 MG tablet Take 5 mg by mouth daily.  11  . Multiple Vitamins-Minerals (PRESERVISION AREDS) CAPS Take 1 capsule by mouth once a week.     . nitroGLYCERIN (NITROSTAT) 0.4 MG SL tablet Place 0.4 mg under the tongue every 5 (five) minutes as needed for chest pain (3 doses MAX).    [name] olmesartan (BENICAR) 40 MG tablet Take 20 mg by mouth 2 (two) times daily.     [name] PREMARIN vaginal cream Place 1-2 g vaginally 2 (two) times a week.  4  . simvastatin (ZOCOR) 20 MG tablet Take 20 mg by mouth every Monday, Wednesday, and Friday.     [name] VITAMIN E PO Take 1 capsule by mouth daily.     No current facility-administered medications for this  visit.    Allergies:   Fentanyl; Fish allergy; Morphine and related; Singulair; Actonel; Dilaudid; Fosamax; Hydralazine; Lipitor; Lisinopril; Micardis; Septra; Tekturna; Tricor; Amlodipine; Avapro; Cardio complete; Cardizem; Ciprofloxacin; Codeine; Cyclobenzaprine; Forteo; Inapsine; Ivp dye; Shellfish allergy; and Tussionex pennkinetic er    Social History:  The patient  reports that she has quit smoking. Her smoking use included Cigarettes. She has a 30 pack-year smoking history. She has never used smokeless tobacco. She reports that she does not  drink alcohol or use illicit drugs.   Family History:  The patient's family history includes Alzheimer's disease in her sister; Congestive Heart Failure in her father; Dementia in her brother; Diabetes type II in her brother, mother, sister, and sister; Healthy in her sister and sister; Heart attack in her mother and sister; Heart disease in her brother, mother, and sister; Kidney failure in her sister and sister; Other in her brother; Pulmonary disease in her sister; Pulmonary embolism in her father; Stroke in her brother and brother.    ROS:  General:no colds or fevers, no weight changes Skin:no rashes or ulcers HEENT:no blurred vision, no congestion CV:see HPI PUL:see HPI GI:no diarrhea constipation or melena, no indigestion GU:no hematuria, no dysuria MS:no joint pain, no claudication Neuro:no syncope, no lightheadedness Endo:no diabetes, + thyroid disease  Wt Readings from Last 3 Encounters:  10/04/15 111 lb 3.2 oz (50.44 kg)  09/11/15 110 lb 12.8 oz (50.259 kg)  04/18/15 106 lb 4.8 oz (48.217 kg)     PHYSICAL EXAM: VS:  BP 184/90 mmHg  Pulse 72  Ht 5\' 1"  (1.549 m)  Wt 111 lb 3.2 oz (50.44 kg)  BMI 21.02 kg/m2  SpO2 98% , BMI Body mass index is 21.02 kg/(m^2). General:Pleasant affect, NAD Skin:Warm and dry, brisk capillary refill HEENT:normocephalic, sclera clear, mucus membranes moist Neck:supple, no JVD, no bruits  Heart:S1S2  RRR without murmur, gallup, rub or click Lungs:clear without rales, rhonchi, or wheezes [id number], non tender, + BS, do not palpate liver spleen or masses Ext:no lower ext edema, 2+ pedal pulses, 2+ radial pulses Neuro:alert and oriented, MAE, follows commands, + facial symmetry    EKG:  EKG is NOT ordered today.    Recent Labs: 03/08/2015: ALT 13*; TSH 0.040* 03/10/2015: Magnesium 1.9 09/04/2015: B Natriuretic Peptide 400.7*; BUN 28*; Creatinine, Ser 1.44*; Hemoglobin 11.5*; Platelets 296; Potassium 4.6; Sodium 134*    Lipid Panel    Component Value Date/Time   CHOL  05/10/2008 0635    177        ATP III CLASSIFICATION:  <200     mg/dL   Desirable  200-239  mg/dL   Borderline High  >=240    mg/dL   High          TRIG 546* 05/10/2008 0635   HDL 31* 05/10/2008 0635   CHOLHDL 5.7 05/10/2008 0635   VLDL UNABLE TO CALCULATE IF TRIGLYCERIDE OVER 400 mg/dL 05/10/2008 0635   LDLCALC  05/10/2008 0635    UNABLE TO CALCULATE IF TRIGLYCERIDE OVER 400 mg/dL        Total Cholesterol/HDL:CHD Risk Coronary Heart Disease Risk Table                     Men   Women  1/2 Average Risk   3.4   3.3  Average Risk       5.0   4.4  2 X Average Risk   9.6   7.1  3 X Average Risk  23.4   11.0        Use the calculated Patient Ratio above and the CHD Risk Table to determine the patient's CHD Risk.        ATP III CLASSIFICATION (LDL):  <100     mg/dL   Optimal  100-129  mg/dL   Near or Above                    Optimal  130-159  mg/dL   Borderline  160-189  mg/dL   High  >190     mg/dL   Very High       Other studies Reviewed: Additional studies/ records that were reviewed today include: as above with Echo.   ASSESSMENT AND PLAN:  1. Near syncope several times a day. With carotid bruits  Carotid doppler with mild disease -- echo with decreased EF awaiting lexiscan myoview . She has known aortic and mitral disease.   She is now wearing  24 hr. Monitor  2. Renovascular  hypertension with chronic kidney disease: The left renal artery stent was found to be recently occluded. This probably has been occluded for few months. Thus, there is probably unkown benefit from revascularization. However, recent renal ultrasound showed left renal size to be reasonable at 9 cm which might indicate some viability.  Biggest issue is intolerance to multiple medications. She tolerates Clonidine and thus I decided to change this to 0.1 mg bid instead of prn.  Per Dr. Fletcher Anon "If not improvement with BP, I might consider revascularization of left renal artery occlusion after addressing the mass noted on posterior bladder wall."    BP remains elevated, have increased evening dose of clonidine to 0.2 mg.  And continue the 0.1 AM and lunch.    3. Bladder mass: has seen Dr. Gaynelle Arabian and had cysto he did not find any abnormalities per pt.   4. Coronary artery disease involving native coronary artery of native heart without angina pectoris No chest pain no SOB  5. Hyperlipidemia On therapy and followed by primary care  6. Recent 2 back surgeries   Current medicines are reviewed with the patient today.  The patient Has no concerns regarding medicines.  The following changes have been made:  See above Labs/ tests ordered today include:see above  Disposition:   FU:  see above  Signed, Cecilie Kicks, NP  10/04/2015 3:10 PM    [address], [place], [place], [place] Phone: [phone]; Fax: [phone]

## 2015-10-08 ENCOUNTER — Telehealth: Payer: Self-pay | Admitting: *Deleted

## 2015-10-08 NOTE — Telephone Encounter (Signed)
I spoke with pt. She reports blood pressure has improved since clonidine increased. Reports most recent blood pressure is 168/80 and most readings are in this range. Also states she is moving on July 6th and wants to take monitor off at that time. I explained to pt we would like her to wear for the whole 30 days which would be through July 19th.  She states monitor is heavy and cumbersome and wants to see if OK with Dr. Tamala Julian for her to take off on the 6th.

## 2015-10-08 NOTE — Telephone Encounter (Signed)
Patient saw Cecilie Kicks on 10/04/15 and she was told to call with an update on how the new dose of clonidine was working. She stated that her bp has been lower on this dose.

## 2015-10-09 NOTE — Telephone Encounter (Signed)
Okay to stop when requested. Tell her we will do the best we can with inadequate data.

## 2015-10-09 NOTE — Telephone Encounter (Signed)
Informed pt of information provided by Dr. Tamala Julian. Pt verbalized understanding.

## 2015-10-12 ENCOUNTER — Telehealth: Payer: Self-pay | Admitting: Interventional Cardiology

## 2015-10-12 NOTE — Telephone Encounter (Signed)
New Message  Pt was contacted to schedule appt w/ Dr Fletcher Anon per Jennifer/staff message-   <This pt needs an appt ASAP with Dr. Fletcher Anon. See message below:  Please arrange pt to see Dr. Fletcher Anon for Renal artery stenosis and uncontrolled HTN ASAP, he thought he could do a stent. Please let pt know, I had discussed through epic with Dr. Tamala Julian. >  Pt refused to sched appt and requested to speak w/ RN about other options. Please call back and discuss.

## 2015-10-12 NOTE — Telephone Encounter (Signed)
Returned pt call. Pt sts that she is in agreement that she needs f/u for her renal artery stenosis/htn but does not want to see Dr.Arida. Pt would like to know who else Dr.Smith would recommend. Pt sts that she will go with what ever Dr.Smith recommends. Adv pt that Dr.Smith is out of the office I will fwd him an update and call back with his response. Pt verbalized understanding.

## 2015-10-12 NOTE — Telephone Encounter (Signed)
Dr. [name].

## 2015-10-15 ENCOUNTER — Telehealth: Payer: Self-pay | Admitting: Interventional Cardiology

## 2015-10-15 DIAGNOSIS — R55 Syncope and collapse: Secondary | ICD-10-CM

## 2015-10-15 NOTE — Telephone Encounter (Signed)
Pt would like to switch PV physicians from Dr.Allred to Dr.Berry. Dr.Smith is aware. Adv pt that I will fwd a message to pcc.to call her with an appt. Pt voiced appreciation and verbalized understanding.

## 2015-10-15 NOTE — Telephone Encounter (Signed)
Pt aware that she should keep her appt for her upcoming stress test (lexsiscan). Pt has a hx of CAD and a recent reduction in her EF per most recent echo. Pt agreeable and verbalized understanding.

## 2015-10-15 NOTE — Telephone Encounter (Signed)
New message   Pt is calling to see if she still needs MYOCARDIAL PERFUSION done

## 2015-10-15 NOTE — Telephone Encounter (Signed)
Spoke with pt and she states that she will not go to the ER for CT scan because it will take too long.  Advised pt that I could place order and someone would call her to schedule and she said that was fine and she would do it some time in August and she is trying to move right now and does not have time.  Advised pt of the importance of having this done soon so we can try to figure out what is going on because we don't want her to get injured if this happens again.  Pt verbalized understanding and was in agreement to have scan done sooner.  Advised pt someone will call her to get this scheduled.

## 2015-10-15 NOTE — Telephone Encounter (Signed)
Received monitor tracing where pt initiated recording.  On 10/14/15 at approximately 6pm pt had a syncopal episode.  Pt states that she was cooking and suddenly felt like she was going to pass out, so she tried to sit down but did ultimately end up falling.  Pt denies hitting her head or any other injuries.  Vitals right after incident 159/82, HR 74.  Vitals this morning 172/76, HR 72.  Advised pt that I would speak with our PA and see about recommendations and we may need her to be seen in office today.  Pt states that she has been feeling fine since this occurred and prefers not to be seen because she is in the process of moving and does not have time.  Spoke with Melina Copa, PA-C and she said pt needs to have a head CT completed either by going to ER or we can order one and have it completed.  Attempted to call pt back but phone rang several times with no answer and no VM.  Will try again later.

## 2015-10-17 ENCOUNTER — Telehealth: Payer: Self-pay | Admitting: *Deleted

## 2015-10-17 NOTE — Telephone Encounter (Signed)
Called pt to get her scheduled with an APP 10/29/15 on a day Dr. Tamala Julian is in the office... Per pt husband, pt is in the bed asleep.  He will have her call us back when she gets up

## 2015-10-17 NOTE — Telephone Encounter (Signed)
Pt returned my call.  She is now willing to see Dr. [name]. Sent Fredia Beets a message to see if she could get her an appt since she was covering [employer].

## 2015-10-22 ENCOUNTER — Telehealth (HOSPITAL_COMMUNITY): Payer: Self-pay | Admitting: *Deleted

## 2015-10-22 NOTE — Telephone Encounter (Signed)
Left message on voicemail per DPR in reference to upcoming appointment scheduled on 10/24/15 with detailed instructions given per Myocardial Perfusion Study Information Sheet for the test. LM to arrive 15 minutes early, and that it is imperative to arrive on time for appointment to keep from having the test rescheduled. If you need to cancel or reschedule your appointment, please call the office within 24 hours of your appointment. Failure to do so may result in a cancellation of your appointment, and a $50 no show fee. Phone number given for call back for any questions.  Hubbard Robinson, RN

## 2015-10-24 ENCOUNTER — Ambulatory Visit (HOSPITAL_COMMUNITY): Payer: Medicare Other | Attending: Cardiology

## 2015-10-24 DIAGNOSIS — E119 Type 2 diabetes mellitus without complications: Secondary | ICD-10-CM | POA: Diagnosis not present

## 2015-10-24 DIAGNOSIS — I10 Essential (primary) hypertension: Secondary | ICD-10-CM | POA: Insufficient documentation

## 2015-10-24 DIAGNOSIS — I2510 Atherosclerotic heart disease of native coronary artery without angina pectoris: Secondary | ICD-10-CM

## 2015-10-24 DIAGNOSIS — R55 Syncope and collapse: Secondary | ICD-10-CM

## 2015-10-24 DIAGNOSIS — R9439 Abnormal result of other cardiovascular function study: Secondary | ICD-10-CM | POA: Insufficient documentation

## 2015-10-24 DIAGNOSIS — Z8249 Family history of ischemic heart disease and other diseases of the circulatory system: Secondary | ICD-10-CM | POA: Insufficient documentation

## 2015-10-24 DIAGNOSIS — Z87891 Personal history of nicotine dependence: Secondary | ICD-10-CM | POA: Insufficient documentation

## 2015-10-24 DIAGNOSIS — R42 Dizziness and giddiness: Secondary | ICD-10-CM | POA: Diagnosis not present

## 2015-10-24 LAB — MYOCARDIAL PERFUSION IMAGING
CHL CUP NUCLEAR SDS: 3
CHL CUP NUCLEAR SRS: 9
CHL CUP NUCLEAR SSS: 12
LV dias vol: 86 mL (ref 46–106)
LV sys vol: 41 mL
Peak HR: 85 {beats}/min
RATE: 0.31
Rest HR: 63 {beats}/min
TID: 0.94

## 2015-10-24 MED ORDER — REGADENOSON 0.4 MG/5ML IV SOLN
0.4000 mg | Freq: Once | INTRAVENOUS | Status: AC
  Administered 2015-10-24: 0.4 mg via INTRAVENOUS

## 2015-10-24 MED ORDER — TECHNETIUM TC 99M TETROFOSMIN IV KIT
31.5000 | PACK | Freq: Once | INTRAVENOUS | Status: AC | PRN
  Administered 2015-10-24: 32 via INTRAVENOUS
  Filled 2015-10-24: qty 32

## 2015-10-24 MED ORDER — TECHNETIUM TC 99M TETROFOSMIN IV KIT
10.1000 | PACK | Freq: Once | INTRAVENOUS | Status: AC | PRN
  Administered 2015-10-24: 10.1 via INTRAVENOUS
  Filled 2015-10-24: qty 10

## 2015-10-25 ENCOUNTER — Ambulatory Visit (INDEPENDENT_AMBULATORY_CARE_PROVIDER_SITE_OTHER)
Admission: RE | Admit: 2015-10-25 | Discharge: 2015-10-25 | Disposition: A | Discharge status: Home / Self Care | Payer: Medicare Other | Source: Ambulatory Visit | Attending: Physician Assistant | Admitting: Physician Assistant

## 2015-10-25 DIAGNOSIS — R55 Syncope and collapse: Secondary | ICD-10-CM | POA: Diagnosis not present

## 2015-11-07 ENCOUNTER — Encounter (HOSPITAL_COMMUNITY): Payer: Medicare Other

## 2015-11-12 ENCOUNTER — Telehealth: Payer: Self-pay

## 2015-11-12 MED ORDER — CARVEDILOL 12.5 MG PO TABS: 12.5000 mg | ORAL_TABLET | Freq: Two times a day (BID) | ORAL | 5 refills | Status: DC

## 2015-11-12 NOTE — Telephone Encounter (Signed)
-----   Message from Belva Crome, MD sent at 11/12/2015  7:54 AM EDT ----- Let the patient know please let her know that the monitor does not reveal an explanation for fainting. She does get rather slow heart rates down into the low 40s. This is evidence that her pacemaker (sinus node) is sick. Decrease carvedilol to 12.5 mg twice a day. This will allow the heart rate to increase A copy will be sent to Irven Shelling, MD

## 2015-11-12 NOTE — Telephone Encounter (Signed)
Patient aware of cardiac monitor results and Dr.Smith's recommendation. She will reduce Carvedilol to 12.5mg  bid. She will keep her f/u appt scheduled with Dr.Smith for 8/31. Pt verbalized understanding.

## 2015-11-28 ENCOUNTER — Ambulatory Visit (INDEPENDENT_AMBULATORY_CARE_PROVIDER_SITE_OTHER): Payer: Medicare Other | Admitting: Cardiovascular Disease

## 2015-11-28 ENCOUNTER — Encounter: Payer: Self-pay | Admitting: Cardiovascular Disease

## 2015-11-28 VITALS — BP 170/64 | HR 63 | Ht 60.0 in | Wt 114.4 lb

## 2015-11-28 DIAGNOSIS — I151 Hypertension secondary to other renal disorders: Secondary | ICD-10-CM

## 2015-11-28 DIAGNOSIS — I158 Other secondary hypertension: Secondary | ICD-10-CM

## 2015-11-28 DIAGNOSIS — I701 Atherosclerosis of renal artery: Secondary | ICD-10-CM

## 2015-11-28 NOTE — Progress Notes (Signed)
11/28/2015 Earna Coder   08-24-1931  [id number]  Primary Physician Irven Shelling, MD Primary Cardiologist: Lorretta Harp MD Renae Gloss  HPI:  Ms. Schwarzkopf is a delightful 82 year old female fell appearing married Caucasian female accompanied by her husband Thayer Jew. She was referred by Dr. Tamala Julian for peripheral vascular evaluation because of difficult call to control hypertension the setting of a occluded left renal artery stent. She had a left renal artery stent placed by Dr. Irish Lack in 2012 for renal vascular hypertension. After that her blood pressure was much better controlled. She also has a history of coronary artery disease with a known occluded RCA with normal LV function and she is a symptomatically from this. Her blood pressures were recorded in the controlled after the renal artery stent implantation for several years however last year she began to develop more difficult to control blood pressures. Renal Dopplers however did not suggest renal artery stenosis and in addition showed that her renal dimensions were symmetric. Dr. Fletcher Anon performed angiography on her 12/20/14 demonstrating an occluded left renal artery stent although there appeared to be a perfusion in the renal artery beyond this and the renal dimension remained fairly normal at 9 cm.   Current Outpatient Prescriptions  Medication Sig Dispense Refill  . aspirin 325 MG tablet Take 325 mg by mouth at bedtime.    . betamethasone dipropionate (DIPROLENE) 0.05 % cream Apply 1 application topically daily as needed (irritated skin).    . Calcium Citrate (CITRACAL PO) Take 1,200 mg by mouth daily.    . carvedilol (COREG) 12.5 MG tablet Take 1 tablet (12.5 mg total) by mouth 2 (two) times daily with a meal. 60 tablet 5  . cholecalciferol (VITAMIN D) 1000 UNITS tablet Take 2,000 Units by mouth daily.     . cloNIDine (CATAPRES) 0.1 MG tablet Take 0.1 mg by mouth 3 (three) times daily with meals.    . cloNIDine  (CATAPRES) 0.2 MG tablet Take 0.2 mg by mouth at bedtime.    [name] Cod Liver Oil CAPS Take 1 capsule by mouth daily.    [name] denosumab (PROLIA) 60 MG/ML SOLN injection Inject 60 mg into the skin every 6 (six) months. Administer in upper arm, thigh, or abdomen    . docusate sodium (COLACE) 100 MG capsule Take 100 mg by mouth daily as needed for mild constipation.    [name] esomeprazole (NEXIUM) 40 MG capsule Take 80 mg by mouth 2 (two) times daily.     . fexofenadine (ALLEGRA) 180 MG tablet Take 180 mg by mouth daily.    . meclizine (ANTIVERT) 25 MG tablet Take 25 mg by mouth 3 (three) times daily.     . methimazole (TAPAZOLE) 5 MG tablet Take 5 mg by mouth daily.  11  . Multiple Vitamins-Minerals (PRESERVISION AREDS) CAPS Take 1 capsule by mouth once a week.     . nitroGLYCERIN (NITROSTAT) 0.4 MG SL tablet Place 0.4 mg under the tongue every 5 (five) minutes as needed for chest pain (3 doses MAX).    [name] olmesartan (BENICAR) 40 MG tablet Take 20 mg by mouth 2 (two) times daily.     [name] PREMARIN vaginal cream Place 1-2 g vaginally 2 (two) times a week.  4  . simvastatin (ZOCOR) 20 MG tablet Take 20 mg by mouth daily.    [name] VITAMIN E PO Take 1 capsule by mouth daily.     No current facility-administered medications for this visit.  Allergies  Allergen Reactions  . Fentanyl Anaphylaxis    Cardiac arrest and coded  . Fish Allergy Anaphylaxis and Rash  . Morphine And Related Anaphylaxis    Cardiac arrest and coded Per Dr. Conley Canal patient has taken percocet without issue  . Singulair [Montelukast] Palpitations and Other (See Comments)    Increased BP and HR  . Actonel [Risedronate Sodium] Rash and Other (See Comments)    weakness  . Dilaudid [Hydromorphone Hcl] Rash and Other (See Comments)    Crying and screaming  . Fosamax [Alendronate Sodium] Rash and Other (See Comments)    Weakness and myalgias  . Hydralazine Diarrhea and Other (See Comments)    Swelling in stomach  . Lipitor [Atorvastatin]  Swelling and Rash    Tongue swelling   . Lisinopril Diarrhea, Rash and Cough  . Micardis [Telmisartan] Diarrhea and Rash  . Septra [Sulfamethoxazole-Trimethoprim] Nausea And Vomiting and Rash  . Tekturna [Aliskiren] Swelling and Rash    Swelling of tongue  . Tricor [Fenofibrate] Rash and Other (See Comments)    Flu symptoms  . Amlodipine Diarrhea  . Avapro [Irbesartan] Rash  . Cardio Complete [Nutritional Supplements] Rash  . Cardizem [Diltiazem Hcl] Rash  . Ciprofloxacin Rash and Other (See Comments)    Black spot on body  . Codeine Rash  . Cyclobenzaprine Nausea Only  . Forteo [Teriparatide (Recombinant)] Rash  . Inapsine [Droperidol] Rash  . Ivp Dye [Iodinated Diagnostic Agents] Rash  . Shellfish Allergy Rash  . Tussionex Pennkinetic Er [Hydrocod Polst-Cpm Polst Er] Itching and Rash    Social History   Social History  . Marital status: Married    Spouse name: N/A  . Number of children: N/A  . Years of education: N/A   Occupational History  . Not on file.   Social History Main Topics  . Smoking status: Former Smoker    Packs/day: 1.00    Years: 30.00    Types: Cigarettes  . Smokeless tobacco: Never Used     Comment: "quit smoking in 1989"  . Alcohol use No  . Drug use: No  . Sexual activity: No   Other Topics Concern  . Not on file   Social History Narrative  . No narrative on file     Review of Systems: General: negative for chills, fever, night sweats or weight changes.  Cardiovascular: negative for chest pain, dyspnea on exertion, edema, orthopnea, palpitations, paroxysmal nocturnal dyspnea or shortness of breath Dermatological: negative for rash Respiratory: negative for cough or wheezing Urologic: negative for hematuria Abdominal: negative for nausea, vomiting, diarrhea, bright red blood per rectum, melena, or hematemesis Neurologic: negative for visual changes, syncope, or dizziness All other systems reviewed and are otherwise negative except as  noted above.    Blood pressure (!) 170/64, pulse 63, height 5' (1.524 m), weight 114 lb 6.4 oz (51.9 kg).  General appearance: alert and no distress Neck: no adenopathy, no JVD, supple, symmetrical, trachea midline, thyroid not enlarged, symmetric, no tenderness/mass/nodules and Soft bilateral carotid bruits Lungs: clear to auscultation bilaterally Heart: regular rate and rhythm, S1, S2 normal, no murmur, click, rub or gallop Extremities: extremities normal, atraumatic, no cyanosis or edema  EKG not performed today  ASSESSMENT AND PLAN:   Renal artery stenosis [facility]) Mr. Zale was referred to me by Dr. Tamala Julian for evaluation of renal vascular hypertension. She had a left renal artery stent placed by Dr. Irish Lack in 2012. After that her blood pressures were much better controlled. Last year her blood  pressures came for difficult control and despite renal Dopplers that were performed 10/20/14 that suggested a widely patent stent she underwent angiography by Dr. Fletcher Anon 957/16 that showed an occluded left renal stent. Interestingly, her multiple dimension on the left was symmetric to the right and well-preserved suggesting that her kidney was still adequately perfused and potentially renin producing. Blood pressures have been labile and more difficult to control on multiple medications. I'm going to repeat renal Doppler studies and if her left renal dimension remains adequate have decided to proceed with relook angiography and potential intervention if there is even a small channel. I discussed this with Dr. Tamala Julian, her cardiologist, and with the patient and her husband and they agree to proceed      Lorretta Harp MD [facility], [facility] 11/28/2015 3:03 PM

## 2015-11-28 NOTE — Patient Instructions (Addendum)
Medication Instructions:  Your physician recommends that you continue on your current medications as directed. Please refer to the Current Medication list given to you today.   Testing/Procedures: Your physician has requested that you have a renal artery duplex. During this test, an ultrasound is used to evaluate blood flow to the kidneys. Allow one hour for this exam. Do not eat after midnight the day before and avoid carbonated beverages. Take your medications as you usually do.   Follow-Up: Your physician recommends that you schedule a follow-up appointment in: [place].  If you need a refill on your cardiac medications before your next appointment, please call your pharmacy.

## 2015-11-28 NOTE — Assessment & Plan Note (Signed)
[name] was referred to me by Dr. Tamala Julian for evaluation of renal vascular hypertension. She had a left renal artery stent placed by Dr. Irish Lack in 2012. After that her blood pressures were much better controlled. Last year her blood pressures came for difficult control and despite renal Dopplers that were performed 10/20/14 that suggested a widely patent stent she underwent angiography by Dr. Fletcher Anon 957/16 that showed an occluded left renal stent. Interestingly, her multiple dimension on the left was symmetric to the right and well-preserved suggesting that her kidney was still adequately perfused and potentially renin producing. Blood pressures have been labile and more difficult to control on multiple medications. I'm going to repeat renal Doppler studies and if her left renal dimension remains adequate have decided to proceed with relook angiography and potential intervention if there is even a small channel. I discussed this with Dr. Tamala Julian, her cardiologist, and with the patient and her husband and they agree to proceed

## 2015-12-07 ENCOUNTER — Ambulatory Visit (HOSPITAL_COMMUNITY)
Admission: RE | Admit: 2015-12-07 | Discharge: 2015-12-07 | Disposition: A | Discharge status: Home / Self Care | Payer: Medicare Other | Source: Ambulatory Visit | Attending: Cardiology | Admitting: Cardiology

## 2015-12-07 DIAGNOSIS — I151 Hypertension secondary to other renal disorders: Secondary | ICD-10-CM

## 2015-12-07 DIAGNOSIS — E119 Type 2 diabetes mellitus without complications: Secondary | ICD-10-CM | POA: Diagnosis not present

## 2015-12-07 DIAGNOSIS — I2510 Atherosclerotic heart disease of native coronary artery without angina pectoris: Secondary | ICD-10-CM | POA: Diagnosis not present

## 2015-12-07 DIAGNOSIS — I10 Essential (primary) hypertension: Secondary | ICD-10-CM | POA: Insufficient documentation

## 2015-12-07 DIAGNOSIS — E785 Hyperlipidemia, unspecified: Secondary | ICD-10-CM | POA: Insufficient documentation

## 2015-12-07 DIAGNOSIS — I158 Other secondary hypertension: Secondary | ICD-10-CM | POA: Diagnosis present

## 2015-12-07 DIAGNOSIS — I700 Atherosclerosis of aorta: Secondary | ICD-10-CM | POA: Diagnosis not present

## 2015-12-07 DIAGNOSIS — N280 Ischemia and infarction of kidney: Secondary | ICD-10-CM | POA: Insufficient documentation

## 2015-12-07 DIAGNOSIS — Z87891 Personal history of nicotine dependence: Secondary | ICD-10-CM | POA: Diagnosis not present

## 2015-12-13 ENCOUNTER — Encounter: Payer: Self-pay | Admitting: Interventional Cardiology

## 2015-12-13 ENCOUNTER — Ambulatory Visit (INDEPENDENT_AMBULATORY_CARE_PROVIDER_SITE_OTHER): Payer: Medicare Other | Admitting: Interventional Cardiology

## 2015-12-13 VITALS — BP 170/72 | HR 66 | Ht 61.0 in | Wt 113.8 lb

## 2015-12-13 DIAGNOSIS — I2510 Atherosclerotic heart disease of native coronary artery without angina pectoris: Secondary | ICD-10-CM

## 2015-12-13 DIAGNOSIS — I701 Atherosclerosis of renal artery: Secondary | ICD-10-CM | POA: Diagnosis not present

## 2015-12-13 DIAGNOSIS — E785 Hyperlipidemia, unspecified: Secondary | ICD-10-CM

## 2015-12-13 DIAGNOSIS — R55 Syncope and collapse: Secondary | ICD-10-CM

## 2015-12-13 DIAGNOSIS — I10 Essential (primary) hypertension: Secondary | ICD-10-CM

## 2015-12-13 NOTE — Progress Notes (Signed)
Cardiology Office Note    Date:  12/13/2015   ID:  [name], DOB 1931-12-02, MRN [id number]  PCP:  Irven Shelling, MD  Cardiologist: Sinclair Grooms, MD   Chief Complaint  Patient presents with  . Follow-up    HTN  . Coronary Artery Disease    History of Present Illness:  [name] is a 82 y.o. female coronary artery disease with known total occlusion of the right coronary, history of renal artery stenosis with left renal stent 2012 and recent angiogram suggesting total occlusion, severe and labile hypertension, mild aortic stenosis, hyperlipidemia, diabetes mellitus, and recent recurring syncopal episodes.  Syncope has been a recent problem. She had one episode while wearing a continuous monitor. There was no rhythm disturbance to explain the episode. The working diagnosis currently is that of orthostatic hypotension. She did have a CT scan performed and has been noted to have bilateral basal ganglia lacunae infarcts.  She denies angina.  Blood pressures been difficult to control. She has recently been seeing Dr. Gwenlyn [name] who has repeated her bilateral renal duplex which demonstrates normal kidney size and bilateral blood flow to the kidneys.  Past Medical History:  Diagnosis Date  . CAD (coronary artery disease)   . Complication of anesthesia    "almost died when I had my breast reduction"  . Dyslipidemia   . GERD (gastroesophageal reflux disease)   . Heart murmur   . History of hiatal hernia   . HTN (hypertension)   . Hyperthyroidism   . Meniere disease    "for 20 years" takes Antivert 3 times per day  . Myocardial infarction ([place]) 2007  . Osteoporosis   . Renal artery stenosis ([place])   . Type II diabetes mellitus ([place])    "controlled w/diet and exercise only" (02/26/2015)    Past Surgical History:  Procedure Laterality Date  . ANTERIOR CERVICAL DECOMP/DISCECTOMY FUSION  02/2002   [name] 08/26/2010  . APPENDECTOMY    . BACK SURGERY    . BLADDER  SUSPENSION  x3  . BREAST LUMPECTOMY  11/2007   breast needle-localized lumpectomy/notes 08/12/2012  . CARDIAC CATHETERIZATION  2007   "couldn't get stents in; I almost died'  . CATARACT EXTRACTION W/ INTRAOCULAR LENS  IMPLANT, BILATERAL Bilateral   . CHOLECYSTECTOMY    . FRACTURE SURGERY    . HIP FRACTURE SURGERY Right 2011   "added rod in my leg and a ball"  . KYPHOPLASTY N/A 03/20/2015   Procedure: THORACIC TWELVE KYPHOPLASTY;  Surgeon: Cathy Gamma, MD;  Location: [facility]URO ORS;  Service: Neurosurgery;  Laterality: N/A;  . KYPHOPLASTY N/A 04/23/2015   Procedure: KYPHOPLASTY - LUMBAR ONE;  Surgeon: Cathy Gamma, MD;  Location: [facility] NEURO ORS;  Service: Neurosurgery;  Laterality: N/A;  L1 Kyphoplasty  . NECK HARDWARE REMOVAL  11/2002   [name] 08/26/2010  . PERIPHERAL VASCULAR CATHETERIZATION Left 12/20/2014   Procedure: Renal Angiography;  Surgeon: Cathy Hampshire, MD;  Location: [facility] CV LAB;  Service: Cardiovascular;  Laterality: Left;  . REDUCTION MAMMAPLASTY Bilateral 1985  . renal arteriogram  12/2014   occluded previous Lt renal stent and patent rt renal artery.  [name] RENAL ARTERY STENT Left 2012  . TONSILLECTOMY    . VAGINAL HYSTERECTOMY  ~ 1995  . WRIST FRACTURE SURGERY Left 2012    Current Medications: Outpatient Medications Prior to Visit  Medication Sig Dispense Refill  . aspirin 325 MG tablet Take 325 mg by mouth at bedtime.    [name]  betamethasone dipropionate (DIPROLENE) 0.05 % cream Apply 1 application topically daily as needed (irritated skin).    . Calcium Citrate (CITRACAL PO) Take 1,200 mg by mouth daily.    . carvedilol (COREG) 12.5 MG tablet Take 1 tablet (12.5 mg total) by mouth 2 (two) times daily with a meal. 60 tablet 5  . cholecalciferol (VITAMIN D) 1000 UNITS tablet Take 2,000 Units by mouth daily.     . cloNIDine (CATAPRES) 0.1 MG tablet Take 0.1 mg by mouth 3 (three) times daily with meals.    . cloNIDine (CATAPRES) 0.2 MG tablet Take 0.2 mg by mouth at  bedtime.    [name] Cod Liver Oil CAPS Take 1 capsule by mouth daily.    [name] denosumab (PROLIA) 60 MG/ML SOLN injection Inject 60 mg into the skin every 6 (six) months. Administer in upper arm, thigh, or abdomen    . docusate sodium (COLACE) 100 MG capsule Take 100 mg by mouth daily as needed for mild constipation.    [name] esomeprazole (NEXIUM) 40 MG capsule Take 80 mg by mouth 2 (two) times daily.     . fexofenadine (ALLEGRA) 180 MG tablet Take 180 mg by mouth daily.    . meclizine (ANTIVERT) 25 MG tablet Take 25 mg by mouth 3 (three) times daily.     . methimazole (TAPAZOLE) 5 MG tablet Take 5 mg by mouth daily.  11  . Multiple Vitamins-Minerals (PRESERVISION AREDS) CAPS Take 1 capsule by mouth once a week.     . nitroGLYCERIN (NITROSTAT) 0.4 MG SL tablet Place 0.4 mg under the tongue every 5 (five) minutes as needed for chest pain (3 doses MAX).    [name] olmesartan (BENICAR) 40 MG tablet Take 20 mg by mouth 2 (two) times daily.     [name] PREMARIN vaginal cream Place 1-2 g vaginally 2 (two) times a week.  4  . simvastatin (ZOCOR) 20 MG tablet Take 20 mg by mouth daily.    [name] VITAMIN E PO Take 1 capsule by mouth daily.     No facility-administered medications prior to visit.      Allergies:   Fentanyl; Fish allergy; Morphine and related; Singulair [montelukast]; Actonel [risedronate sodium]; Dilaudid [hydromorphone hcl]; Fosamax [alendronate sodium]; Hydralazine; Lipitor [atorvastatin]; Lisinopril; Micardis [telmisartan]; Septra [sulfamethoxazole-trimethoprim]; Tekturna [aliskiren]; Tricor [fenofibrate]; Amlodipine; Avapro [irbesartan]; Cardio complete [nutritional supplements]; Cardizem [diltiazem hcl]; Ciprofloxacin; Codeine; Cyclobenzaprine; Forteo [teriparatide (recombinant)]; Inapsine [droperidol]; Ivp dye [iodinated diagnostic agents]; Shellfish allergy; and Tussionex pennkinetic er [hydrocod polst-cpm polst er]   Social History   Social History  . Marital status: Married    Spouse name: N/A  . Number of  children: N/A  . Years of education: N/A   Social History Main Topics  . Smoking status: Former Smoker    Packs/day: 1.00    Years: 30.00    Types: Cigarettes  . Smokeless tobacco: Never Used     Comment: "quit smoking in 1989"  . Alcohol use No  . Drug use: No  . Sexual activity: No   Other Topics Concern  . None   Social History Narrative  . None     Family History:  The patient's family history includes Alzheimer's disease in her sister; Congestive Heart Failure in her father; Dementia in her brother; Diabetes type II in her brother, mother, sister, and sister; Healthy in her sister and sister; Heart attack in her mother and sister; Heart disease in her brother, mother, and sister; Kidney failure in her sister and sister; Other in her  brother; Pulmonary disease in her sister; Pulmonary embolism in her father; Stroke in her brother and brother.   ROS:   Please see the history of present illness.    Multiple complaints including palpitations especially since her beta blocker dose is been decreased. Decreased hearing. Depression now that she lives in friend's home. There are no social outlets.  All other systems reviewed and are negative.   PHYSICAL EXAM:   VS:  BP (!) 170/72   Pulse 66   Ht 5\' 1"  (1.549 m)   Wt 113 lb 12.8 oz (51.6 kg)   BMI 21.50 kg/m    GEN: Well nourished, well developed, in no acute distress  HEENT: normal  Neck: no JVD, carotid bruits, or masses Cardiac: RRR; 99991111 systolic murmur compatible with known mild aortic stenosis.No rubs but S4 gallop is present. There is no edema  Respiratory:  clear to auscultation bilaterally, normal work of breathing GI: soft, nontender, nondistended, + BS MS: no deformity or atrophy  Skin: warm and dry, no rash Neuro:  Alert and Oriented x 3, Strength and sensation are intact Psych: euthymic mood, full affect  Wt Readings from Last 3 Encounters:  12/13/15 113 lb 12.8 oz (51.6 kg)  11/28/15 114 lb 6.4 oz (51.9 kg)    10/24/15 111 lb (50.3 kg)      Studies/Labs Reviewed:   EKG:  EKG  Not repeated  Recent Labs: 03/08/2015: ALT 13; TSH 0.040 03/10/2015: Magnesium 1.9 09/04/2015: B Natriuretic Peptide 400.7; BUN 28; Creatinine, Ser 1.44; Hemoglobin 11.5; Platelets 296; Potassium 4.6; Sodium 134   Lipid Panel    Component Value Date/Time   CHOL  05/10/2008 0635    177        ATP III CLASSIFICATION:  <200     mg/dL   Desirable  200-239  mg/dL   Borderline High  >=240    mg/dL   High          TRIG 546 (H) 05/10/2008 0635   HDL 31 (L) 05/10/2008 0635   CHOLHDL 5.7 05/10/2008 0635   VLDL UNABLE TO CALCULATE IF TRIGLYCERIDE OVER 400 mg/dL 05/10/2008 0635   LDLCALC  05/10/2008 0635    UNABLE TO CALCULATE IF TRIGLYCERIDE OVER 400 mg/dL        Total Cholesterol/HDL:CHD Risk Coronary Heart Disease Risk Table                     Men   Women  1/2 Average Risk   3.4   3.3  Average Risk       5.0   4.4  2 X Average Risk   9.6   7.1  3 X Average Risk  23.4   11.0        Use the calculated Patient Ratio above and the CHD Risk Table to determine the patient's CHD Risk.        ATP III CLASSIFICATION (LDL):  <100     mg/dL   Optimal  100-129  mg/dL   Near or Above                    Optimal  130-159  mg/dL   Borderline  160-189  mg/dL   High  >190     mg/dL   Very High    Additional studies/ records that were reviewed today include:   Renal artery duplex performed 12/07/15:  Impressions Normal caliber abdominal aorta, with aortic atherosclerosis. Stable bilateral kidney size, right slightly smaller than  left. Normal right renal artery. Patent arterial flow into the left kidney. By angiogram, the main left renal artery is occluded, and this may represent a collateral or accessory renal artery. Patent IVC and renal veins.  ASSESSMENT:    1. Coronary artery disease involving native coronary artery of native heart without angina pectoris   2. Renal artery stenosis ([place])   3. Essential  hypertension   4. Vasovagal syncope   5. Hyperlipidemia      PLAN:  In order of problems listed above:  1. Stable from coronary standpoint. No changes in medical regimen and made. 2. I reviewed the renal duplex study and based upon my conversation with Dr. Gwenlyn [name], the plan will likely be to consider repeat angiography to consider stenting of the left renal artery. I will confirm this with him. The patient and husband are awaiting a final decision. 3. Relatively poor control. 4. No recurrent syncope. I review the CT results and the patient does have lacunar infarcts in the basal ganglia. This raises a question of whether some of the fainting episodes could be neurologic in origin or seizure related. I recommended a neurology consult but the patient refused. She wants to get the kidney situation squared away first.    Medication Adjustments/Labs and Tests Ordered: Current medicines are reviewed at length with the patient today.  Concerns regarding medicines are outlined above.  Medication changes, Labs and Tests ordered today are listed in the Patient Instructions below. Patient Instructions  Medication Instructions:  Your physician recommends that you continue on your current medications as directed. Please refer to the Current Medication list given to you today.   Labwork: None ordered  Testing/Procedures: None ordered  Follow-Up: Your physician wants you to follow-up in: 6-8 months with Dr.Smith You will receive a reminder letter in the mail two months in advance. If you don't receive a letter, please call our office to schedule the follow-up appointment.   Any Other Special Instructions Will Be Listed Below (If Applicable).     If you need a refill on your cardiac medications before your next appointment, please call your pharmacy.      Signed, Sinclair Grooms, MD  12/13/2015 5:40 PM    [facility] [place], [place], [place]  [address] Phone:  [phone]; Fax: [phone]

## 2015-12-13 NOTE — Patient Instructions (Signed)
Medication Instructions:  Your physician recommends that you continue on your current medications as directed. Please refer to the Current Medication list given to you today.   Labwork: None ordered  Testing/Procedures: None ordered  Follow-Up: Your physician wants you to follow-up in: 6-8 months with Dr.Smith You will receive a reminder letter in the mail two months in advance. If you don't receive a letter, please call our office to schedule the follow-up appointment.   Any Other Special Instructions Will Be Listed Below (If Applicable).     If you need a refill on your cardiac medications before your next appointment, please call your pharmacy.

## 2016-01-08 DIAGNOSIS — H8110 Benign paroxysmal vertigo, unspecified ear: Secondary | ICD-10-CM | POA: Insufficient documentation

## 2016-01-08 DIAGNOSIS — H6121 Impacted cerumen, right ear: Secondary | ICD-10-CM | POA: Insufficient documentation

## 2016-01-08 HISTORY — DX: Benign paroxysmal vertigo, unspecified ear: H81.10

## 2016-01-18 ENCOUNTER — Ambulatory Visit (INDEPENDENT_AMBULATORY_CARE_PROVIDER_SITE_OTHER): Payer: Medicare Other | Admitting: Cardiovascular Disease

## 2016-01-18 ENCOUNTER — Encounter: Payer: Self-pay | Admitting: Cardiovascular Disease

## 2016-01-18 ENCOUNTER — Other Ambulatory Visit: Payer: Self-pay | Admitting: *Deleted

## 2016-01-18 VITALS — BP 180/72 | HR 68 | Ht 61.0 in | Wt 113.2 lb

## 2016-01-18 DIAGNOSIS — Z01818 Encounter for other preprocedural examination: Secondary | ICD-10-CM | POA: Diagnosis not present

## 2016-01-18 DIAGNOSIS — Z0000 Encounter for general adult medical examination without abnormal findings: Secondary | ICD-10-CM | POA: Diagnosis not present

## 2016-01-18 DIAGNOSIS — I701 Atherosclerosis of renal artery: Secondary | ICD-10-CM

## 2016-01-18 DIAGNOSIS — Z7901 Long term (current) use of anticoagulants: Secondary | ICD-10-CM

## 2016-01-18 NOTE — Patient Instructions (Addendum)
Medication Instructions:  NO CHANGES.   Testing/Procedures: Dr. [name] has ordered a RENAL angiogram to be done at [facility].  This procedure is going to look at the bloodflow in your RENAL ARTERIES.  If Dr. [name] is able to open up the arteries, you will have to spend one night in the hospital.  If he is not able to open the arteries, you will be able to go home that same day.    After the procedure, you will not be allowed to drive for 3 days or push, pull, or lift anything greater than 10 lbs for one week.    You will be required to have the following tests prior to the procedure:  1. Blood work-the blood work can be done no more than 14 days prior to the procedure.  It can be done at any [facility] lab.  There is one downstairs on the first floor of this building and one in the [facility] building [phone] [address], [address])   *REPS  NONE PER DR BERRY  Puncture site RIGHT GROIN    If you need a refill on your cardiac medications before your next appointment, please call your pharmacy.

## 2016-01-18 NOTE — Assessment & Plan Note (Signed)
[name] has what appears to be renal vascular hypertension. She had a renal artery stent placed by Dr. varicosity 2012 with marked improvement in her blood pressure after that almost immediately. Because of recurrent difficult to control hypertension she had angiography performed by Dr. Fletcher Anon 12/20/14 which seemed to indicate occlusion of the stent although on review the angiograms there may have been a small channel. Renal dimensions remain stable. She is on multiple antihypertensive medications and remains difficult to control with elevated blood pressures. She does have moderate renal insufficiency. Discussed options and have decided to proceed with re-angiography and potential intervention.

## 2016-01-18 NOTE — Progress Notes (Signed)
01/18/2016 [name]   11/26/1931  [id number]  Primary Physician Irven Shelling, MD Primary Cardiologist: Lorretta Harp MD Renae Gloss  HPI:  [name] is a delightful 82 year old female fell appearing married Caucasian female accompanied by her husband [name]. I last saw her in the office 11/28/15. She was referred by Dr. Tamala Julian for peripheral vascular evaluation because of difficult call to control hypertension the setting of a occluded left renal artery stent. She had a left renal artery stent placed by Dr. Irish Lack in 2012 for renal vascular hypertension. After that her blood pressure was much better controlled. She also has a history of coronary artery disease with a known occluded RCA with normal LV function and she is a symptomatically from this. Her blood pressures were recorded in the controlled after the renal artery stent implantation for several years however last year she began to develop more difficult to control blood pressures. Renal Dopplers however did not suggest renal artery stenosis and in addition showed that her renal dimensions were symmetric. Dr. Fletcher Anon performed angiography on her 12/20/14 demonstrating an occluded left renal artery stent although there appeared to be a perfusion in the renal artery beyond this and the renal dimension remained fairly normal at 9 cm. her most recent renal Doppler studies performed 12/07/15 again revealed preservation of her left renal dimensions. Her blood pressures have been significantly elevated and she wishes to proceed with angiography and potential intervention.    Current Outpatient Prescriptions  Medication Sig Dispense Refill  . aspirin 325 MG tablet Take 325 mg by mouth at bedtime.    . betamethasone dipropionate (DIPROLENE) 0.05 % cream Apply 1 application topically daily as needed (irritated skin).    . Calcium Citrate (CITRACAL PO) Take 1,200 mg by mouth daily.    . carvedilol (COREG) 12.5 MG tablet Take  1 tablet (12.5 mg total) by mouth 2 (two) times daily with a meal. 60 tablet 5  . cholecalciferol (VITAMIN D) 1000 UNITS tablet Take 2,000 Units by mouth daily.     . cloNIDine (CATAPRES) 0.1 MG tablet Take 0.1 mg by mouth 3 (three) times daily with meals.    . cloNIDine (CATAPRES) 0.2 MG tablet Take 0.2 mg by mouth at bedtime.    [name] Cod Liver Oil CAPS Take 1 capsule by mouth daily.    [name] denosumab (PROLIA) 60 MG/ML SOLN injection Inject 60 mg into the skin every 6 (six) months. Administer in upper arm, thigh, or abdomen    . docusate sodium (COLACE) 100 MG capsule Take 100 mg by mouth daily as needed for mild constipation.    [name] esomeprazole (NEXIUM) 40 MG capsule Take 80 mg by mouth 2 (two) times daily.     . fexofenadine (ALLEGRA) 180 MG tablet Take 180 mg by mouth daily.    . meclizine (ANTIVERT) 25 MG tablet Take 25 mg by mouth 3 (three) times daily.     . methimazole (TAPAZOLE) 5 MG tablet Take 5 mg by mouth daily.  11  . Multiple Vitamins-Minerals (PRESERVISION AREDS) CAPS Take 1 capsule by mouth once a week.     . nitroGLYCERIN (NITROSTAT) 0.4 MG SL tablet Place 0.4 mg under the tongue every 5 (five) minutes as needed for chest pain (3 doses MAX).    [name] olmesartan (BENICAR) 40 MG tablet Take 20 mg by mouth 2 (two) times daily.     [name] PREMARIN vaginal cream Place 1-2 g vaginally 2 (two) times a week.  4  . simvastatin (ZOCOR) 20 MG tablet Take 20 mg by mouth daily.    [name] VITAMIN E PO Take 1 capsule by mouth daily.     No current facility-administered medications for this visit.     Allergies  Allergen Reactions  . Fentanyl Anaphylaxis    Cardiac arrest and coded  . Fish Allergy Anaphylaxis and Rash  . Morphine And Related Anaphylaxis    Cardiac arrest and coded Per Dr. Conley Canal patient has taken percocet without issue  . Singulair [Montelukast] Palpitations and Other (See Comments)    Increased BP and HR  . Actonel [Risedronate Sodium] Rash and Other (See Comments)    weakness  .  Dilaudid [Hydromorphone Hcl] Rash and Other (See Comments)    Crying and screaming  . Fosamax [Alendronate Sodium] Rash and Other (See Comments)    Weakness and myalgias  . Hydralazine Diarrhea and Other (See Comments)    Swelling in stomach  . Lipitor [Atorvastatin] Swelling and Rash    Tongue swelling   . Lisinopril Diarrhea, Rash and Cough  . Micardis [Telmisartan] Diarrhea and Rash  . Septra [Sulfamethoxazole-Trimethoprim] Nausea And Vomiting and Rash  . Tekturna [Aliskiren] Swelling and Rash    Swelling of tongue  . Tricor [Fenofibrate] Rash and Other (See Comments)    Flu symptoms  . Amlodipine Diarrhea  . Avapro [Irbesartan] Rash  . Cardio Complete [Nutritional Supplements] Rash  . Cardizem [Diltiazem Hcl] Rash  . Ciprofloxacin Rash and Other (See Comments)    Black spot on body  . Codeine Rash  . Cyclobenzaprine Nausea Only  . Forteo [Teriparatide (Recombinant)] Rash  . Inapsine [Droperidol] Rash  . Ivp Dye [Iodinated Diagnostic Agents] Rash  . Shellfish Allergy Rash  . Tussionex Pennkinetic Er [Hydrocod Polst-Cpm Polst Er] Itching and Rash    Social History   Social History  . Marital status: Married    Spouse name: N/A  . Number of children: N/A  . Years of education: N/A   Occupational History  . Not on file.   Social History Main Topics  . Smoking status: Former Smoker    Packs/day: 1.00    Years: 30.00    Types: Cigarettes  . Smokeless tobacco: Never Used     Comment: "quit smoking in 1989"  . Alcohol use No  . Drug use: No  . Sexual activity: No   Other Topics Concern  . Not on file   Social History Narrative  . No narrative on file     Review of Systems: General: negative for chills, fever, night sweats or weight changes.  Cardiovascular: negative for chest pain, dyspnea on exertion, edema, orthopnea, palpitations, paroxysmal nocturnal dyspnea or shortness of breath Dermatological: negative for rash Respiratory: negative for cough or  wheezing Urologic: negative for hematuria Abdominal: negative for nausea, vomiting, diarrhea, bright red blood per rectum, melena, or hematemesis Neurologic: negative for visual changes, syncope, or dizziness All other systems reviewed and are otherwise negative except as noted above.    Blood pressure (!) 180/72, pulse 68, height 5\' 1"  (1.549 m), weight 113 lb 3.2 oz (51.3 kg).  General appearance: alert and no distress Neck: no adenopathy, no JVD, supple, symmetrical, trachea midline, thyroid not enlarged, symmetric, no tenderness/mass/nodules and Bilateral carotid bruits Lungs: clear to auscultation bilaterally Heart: Soft outflow tract murmur consistent with aortic stenosis Extremities: extremities normal, atraumatic, no cyanosis or edema  EKG not performed today  ASSESSMENT AND PLAN:   Renal artery stenosis [facility]) [name] has what  appears to be renal vascular hypertension. She had a renal artery stent placed by Dr. varicosity 2012 with marked improvement in her blood pressure after that almost immediately. Because of recurrent difficult to control hypertension she had angiography performed by Dr. Fletcher Anon 12/20/14 which seemed to indicate occlusion of the stent although on review the angiograms there may have been a small channel. Renal dimensions remain stable. She is on multiple antihypertensive medications and remains difficult to control with elevated blood pressures. She does have moderate renal insufficiency. Discussed options and have decided to proceed with re-angiography and potential intervention.      Lorretta Harp MD FACP,FACC,FAHA, [facility] 01/18/2016 4:47 PM

## 2016-01-21 LAB — CBC WITH DIFFERENTIAL/PLATELET
BASOS ABS: 0 {cells}/uL (ref 0–200)
Basophils Relative: 0 %
EOS PCT: 2 %
Eosinophils Absolute: 128 cells/uL (ref 15–500)
HCT: 34.9 % — ABNORMAL LOW (ref 35.0–45.0)
Hemoglobin: 11.4 g/dL — ABNORMAL LOW (ref 11.7–15.5)
Lymphocytes Relative: 27 %
Lymphs Abs: 1728 cells/uL (ref 850–3900)
MCH: 28 pg (ref 27.0–33.0)
MCHC: 32.7 g/dL (ref 32.0–36.0)
MCV: 85.7 fL (ref 80.0–100.0)
MONOS PCT: 7 %
MPV: 9.5 fL (ref 7.5–12.5)
Monocytes Absolute: 448 cells/uL (ref 200–950)
NEUTROS ABS: 4096 {cells}/uL (ref 1500–7800)
NEUTROS PCT: 64 %
PLATELETS: 274 10*3/uL (ref 140–400)
RBC: 4.07 MIL/uL (ref 3.80–5.10)
RDW: 14.2 % (ref 11.0–15.0)
WBC: 6.4 10*3/uL (ref 3.8–10.8)

## 2016-01-22 LAB — BASIC METABOLIC PANEL
BUN: 23 mg/dL (ref 7–25)
CALCIUM: 8.4 mg/dL — AB (ref 8.6–10.4)
CO2: 24 mmol/L (ref 20–31)
Chloride: 102 mmol/L (ref 98–110)
Creat: 1.29 mg/dL — ABNORMAL HIGH (ref 0.60–0.88)
Glucose, Bld: 148 mg/dL — ABNORMAL HIGH (ref 65–99)
Potassium: 4.4 mmol/L (ref 3.5–5.3)
SODIUM: 136 mmol/L (ref 135–146)

## 2016-01-22 LAB — PROTIME-INR
INR: 1
PROTHROMBIN TIME: 10.4 s (ref 9.0–11.5)

## 2016-01-22 LAB — APTT: APTT: 27 s (ref 22–34)

## 2016-01-22 LAB — TSH: TSH: 2.44 m[IU]/L

## 2016-01-29 DIAGNOSIS — H9113 Presbycusis, bilateral: Secondary | ICD-10-CM | POA: Insufficient documentation

## 2016-01-29 HISTORY — DX: Presbycusis, bilateral: H91.13

## 2016-01-31 ENCOUNTER — Ambulatory Visit (HOSPITAL_COMMUNITY)
Admission: RE | Admit: 2016-01-31 | Discharge: 2016-02-01 | Disposition: A | Discharge status: Home / Self Care | Payer: Medicare Other | Source: Ambulatory Visit | Attending: Cardiovascular Disease | Admitting: Cardiovascular Disease

## 2016-01-31 ENCOUNTER — Encounter (HOSPITAL_COMMUNITY): Payer: Self-pay | Admitting: Cardiovascular Disease

## 2016-01-31 ENCOUNTER — Encounter (HOSPITAL_COMMUNITY)
Admission: RE | Disposition: A | Discharge status: Home / Self Care | Payer: Self-pay | Source: Ambulatory Visit | Attending: Cardiovascular Disease

## 2016-01-31 DIAGNOSIS — H8109 Meniere's disease, unspecified ear: Secondary | ICD-10-CM | POA: Insufficient documentation

## 2016-01-31 DIAGNOSIS — R55 Syncope and collapse: Secondary | ICD-10-CM | POA: Diagnosis not present

## 2016-01-31 DIAGNOSIS — D631 Anemia in chronic kidney disease: Secondary | ICD-10-CM | POA: Diagnosis not present

## 2016-01-31 DIAGNOSIS — E119 Type 2 diabetes mellitus without complications: Secondary | ICD-10-CM

## 2016-01-31 DIAGNOSIS — M549 Dorsalgia, unspecified: Secondary | ICD-10-CM | POA: Insufficient documentation

## 2016-01-31 DIAGNOSIS — Z91013 Allergy to seafood: Secondary | ICD-10-CM | POA: Diagnosis not present

## 2016-01-31 DIAGNOSIS — I129 Hypertensive chronic kidney disease with stage 1 through stage 4 chronic kidney disease, or unspecified chronic kidney disease: Secondary | ICD-10-CM | POA: Diagnosis not present

## 2016-01-31 DIAGNOSIS — E7800 Pure hypercholesterolemia, unspecified: Secondary | ICD-10-CM | POA: Insufficient documentation

## 2016-01-31 DIAGNOSIS — E1122 Type 2 diabetes mellitus with diabetic chronic kidney disease: Secondary | ICD-10-CM | POA: Insufficient documentation

## 2016-01-31 DIAGNOSIS — I701 Atherosclerosis of renal artery: Secondary | ICD-10-CM | POA: Diagnosis present

## 2016-01-31 DIAGNOSIS — K219 Gastro-esophageal reflux disease without esophagitis: Secondary | ICD-10-CM | POA: Insufficient documentation

## 2016-01-31 DIAGNOSIS — Z87891 Personal history of nicotine dependence: Secondary | ICD-10-CM | POA: Insufficient documentation

## 2016-01-31 DIAGNOSIS — I2510 Atherosclerotic heart disease of native coronary artery without angina pectoris: Secondary | ICD-10-CM | POA: Diagnosis present

## 2016-01-31 DIAGNOSIS — N183 Chronic kidney disease, stage 3 unspecified: Secondary | ICD-10-CM | POA: Diagnosis present

## 2016-01-31 DIAGNOSIS — Z7982 Long term (current) use of aspirin: Secondary | ICD-10-CM | POA: Insufficient documentation

## 2016-01-31 DIAGNOSIS — E0590 Thyrotoxicosis, unspecified without thyrotoxic crisis or storm: Secondary | ICD-10-CM | POA: Insufficient documentation

## 2016-01-31 DIAGNOSIS — Z8673 Personal history of transient ischemic attack (TIA), and cerebral infarction without residual deficits: Secondary | ICD-10-CM | POA: Diagnosis not present

## 2016-01-31 DIAGNOSIS — T82858A Stenosis of vascular prosthetic devices, implants and grafts, initial encounter: Secondary | ICD-10-CM | POA: Diagnosis not present

## 2016-01-31 DIAGNOSIS — D649 Anemia, unspecified: Secondary | ICD-10-CM

## 2016-01-31 DIAGNOSIS — Y812 Prosthetic and other implants, materials and accessory general- and plastic-surgery devices associated with adverse incidents: Secondary | ICD-10-CM | POA: Insufficient documentation

## 2016-01-31 DIAGNOSIS — G8929 Other chronic pain: Secondary | ICD-10-CM | POA: Diagnosis not present

## 2016-01-31 DIAGNOSIS — E785 Hyperlipidemia, unspecified: Secondary | ICD-10-CM | POA: Diagnosis present

## 2016-01-31 DIAGNOSIS — Z885 Allergy status to narcotic agent status: Secondary | ICD-10-CM | POA: Insufficient documentation

## 2016-01-31 DIAGNOSIS — R001 Bradycardia, unspecified: Secondary | ICD-10-CM | POA: Diagnosis not present

## 2016-01-31 DIAGNOSIS — I10 Essential (primary) hypertension: Secondary | ICD-10-CM | POA: Diagnosis present

## 2016-01-31 HISTORY — DX: Chronic kidney disease, stage 3 unspecified: N18.30

## 2016-01-31 HISTORY — DX: Syncope and collapse: R55

## 2016-01-31 HISTORY — DX: Disorder of arteries and arterioles, unspecified: I77.9

## 2016-01-31 HISTORY — DX: Cerebral infarction, unspecified: I63.9

## 2016-01-31 HISTORY — DX: Low back pain, unspecified: M54.50

## 2016-01-31 HISTORY — PX: PERIPHERAL VASCULAR CATHETERIZATION: SHX172C

## 2016-01-31 HISTORY — DX: Pure hypercholesterolemia, unspecified: E78.00

## 2016-01-31 HISTORY — DX: Chronic kidney disease, stage 3 (moderate): N18.3

## 2016-01-31 HISTORY — DX: Bradycardia, unspecified: R00.1

## 2016-01-31 HISTORY — DX: Low back pain: M54.5

## 2016-01-31 HISTORY — DX: Peripheral vascular disease, unspecified: I73.9

## 2016-01-31 HISTORY — DX: Other chronic pain: G89.29

## 2016-01-31 HISTORY — DX: Anemia, unspecified: D64.9

## 2016-01-31 LAB — POCT ACTIVATED CLOTTING TIME
ACTIVATED CLOTTING TIME: 153 s
Activated Clotting Time: 252 seconds
Activated Clotting Time: 219 seconds

## 2016-01-31 LAB — GLUCOSE, CAPILLARY
GLUCOSE-CAPILLARY: 235 mg/dL — AB (ref 65–99)
Glucose-Capillary: 165 mg/dL — ABNORMAL HIGH (ref 65–99)

## 2016-01-31 SURGERY — RENAL ANGIOGRAPHY
Anesthesia: LOCAL

## 2016-01-31 MED ORDER — HEPARIN (PORCINE) IN NACL 2-0.9 UNIT/ML-% IJ SOLN
INTRAMUSCULAR | Status: AC
  Filled 2016-01-31: qty 1000

## 2016-01-31 MED ORDER — CARVEDILOL 12.5 MG PO TABS
12.5000 mg | ORAL_TABLET | Freq: Two times a day (BID) | ORAL | Status: DC
  Administered 2016-01-31 – 2016-02-01 (×2): 12.5 mg via ORAL
  Filled 2016-01-31 (×3): qty 1

## 2016-01-31 MED ORDER — NITROGLYCERIN IN D5W 200-5 MCG/ML-% IV SOLN
INTRAVENOUS | Status: DC | PRN
  Administered 2016-01-31: 10 ug/min via INTRAVENOUS

## 2016-01-31 MED ORDER — ESTROGENS, CONJUGATED 0.625 MG/GM VA CREA
1.0000 g | TOPICAL_CREAM | VAGINAL | Status: DC
  Filled 2016-01-31: qty 30

## 2016-01-31 MED ORDER — IODIXANOL 320 MG/ML IV SOLN
INTRAVENOUS | Status: DC | PRN
  Administered 2016-01-31: 107 mL via INTRA_ARTERIAL

## 2016-01-31 MED ORDER — SODIUM CHLORIDE 0.9 % WEIGHT BASED INFUSION: 1.0000 mL/kg/h | INTRAVENOUS | Status: DC

## 2016-01-31 MED ORDER — CLOPIDOGREL BISULFATE 300 MG PO TABS
ORAL_TABLET | ORAL | Status: AC
  Filled 2016-01-31: qty 1

## 2016-01-31 MED ORDER — CLOPIDOGREL BISULFATE 300 MG PO TABS
ORAL_TABLET | ORAL | Status: DC | PRN
  Administered 2016-01-31: 300 mg via ORAL

## 2016-01-31 MED ORDER — PANTOPRAZOLE SODIUM 40 MG PO TBEC
40.0000 mg | DELAYED_RELEASE_TABLET | Freq: Two times a day (BID) | ORAL | Status: DC
  Administered 2016-01-31 – 2016-02-01 (×2): 40 mg via ORAL
  Filled 2016-01-31 (×2): qty 1

## 2016-01-31 MED ORDER — DIPHENHYDRAMINE HCL 50 MG/ML IJ SOLN
25.0000 mg | Freq: Once | INTRAMUSCULAR | Status: AC
  Administered 2016-01-31: 25 mg via INTRAVENOUS

## 2016-01-31 MED ORDER — ASPIRIN 81 MG PO CHEW
81.0000 mg | CHEWABLE_TABLET | ORAL | Status: AC
  Administered 2016-01-31: 81 mg via ORAL

## 2016-01-31 MED ORDER — NITROGLYCERIN IN D5W 200-5 MCG/ML-% IV SOLN
INTRAVENOUS | Status: AC
  Filled 2016-01-31: qty 250

## 2016-01-31 MED ORDER — ACETAMINOPHEN 325 MG PO TABS: 650.0000 mg | ORAL_TABLET | ORAL | Status: DC | PRN

## 2016-01-31 MED ORDER — HEPARIN SODIUM (PORCINE) 1000 UNIT/ML IJ SOLN
INTRAMUSCULAR | Status: AC
  Filled 2016-01-31: qty 1

## 2016-01-31 MED ORDER — ASPIRIN EC 81 MG PO TBEC
81.0000 mg | DELAYED_RELEASE_TABLET | Freq: Every day | ORAL | Status: DC
Start: 2016-01-31 — End: 2016-02-01
  Administered 2016-02-01: 81 mg via ORAL
  Filled 2016-01-31: qty 1

## 2016-01-31 MED ORDER — VITAMIN D 1000 UNITS PO TABS
2000.0000 [IU] | ORAL_TABLET | Freq: Every day | ORAL | Status: DC
  Administered 2016-02-01: 09:00:00 2000 [IU] via ORAL
  Filled 2016-01-31: qty 2

## 2016-01-31 MED ORDER — NITROGLYCERIN 0.4 MG SL SUBL: 0.4000 mg | SUBLINGUAL_TABLET | SUBLINGUAL | Status: DC | PRN

## 2016-01-31 MED ORDER — CLOPIDOGREL BISULFATE 75 MG PO TABS
75.0000 mg | ORAL_TABLET | Freq: Every day | ORAL | Status: DC
  Administered 2016-02-01: 09:00:00 75 mg via ORAL
  Filled 2016-01-31: qty 1

## 2016-01-31 MED ORDER — SIMVASTATIN 20 MG PO TABS
20.0000 mg | ORAL_TABLET | Freq: Every day | ORAL | Status: DC
Start: 2016-02-01 — End: 2016-02-01
  Administered 2016-02-01: 09:00:00 20 mg via ORAL
  Filled 2016-01-31: qty 1

## 2016-01-31 MED ORDER — FAMOTIDINE IN NACL 20-0.9 MG/50ML-% IV SOLN
INTRAVENOUS | Status: AC
  Filled 2016-01-31: qty 50

## 2016-01-31 MED ORDER — CLONIDINE HCL 0.1 MG PO TABS
0.2000 mg | ORAL_TABLET | Freq: Every day | ORAL | Status: DC
  Administered 2016-01-31: 0.2 mg via ORAL
  Filled 2016-01-31: qty 2

## 2016-01-31 MED ORDER — HEPARIN SODIUM (PORCINE) 1000 UNIT/ML IJ SOLN
INTRAMUSCULAR | Status: DC | PRN
  Administered 2016-01-31: 5000 [IU] via INTRAVENOUS

## 2016-01-31 MED ORDER — DOCUSATE SODIUM 100 MG PO CAPS
100.0000 mg | ORAL_CAPSULE | Freq: Every evening | ORAL | Status: DC
  Administered 2016-01-31: 100 mg via ORAL
  Filled 2016-01-31: qty 1

## 2016-01-31 MED ORDER — SODIUM CHLORIDE 0.9% FLUSH: 3.0000 mL | INTRAVENOUS | Status: DC | PRN

## 2016-01-31 MED ORDER — LIDOCAINE HCL (PF) 1 % IJ SOLN
INTRAMUSCULAR | Status: AC
  Filled 2016-01-31: qty 30

## 2016-01-31 MED ORDER — MIDAZOLAM HCL 2 MG/2ML IJ SOLN
INTRAMUSCULAR | Status: AC
  Filled 2016-01-31: qty 2

## 2016-01-31 MED ORDER — LIDOCAINE HCL (PF) 1 % IJ SOLN
INTRAMUSCULAR | Status: DC | PRN
  Administered 2016-01-31: 30 mL

## 2016-01-31 MED ORDER — SODIUM CHLORIDE 0.9 % WEIGHT BASED INFUSION
3.0000 mL/kg/h | INTRAVENOUS | Status: DC
  Administered 2016-01-31: 3 mL/kg/h via INTRAVENOUS

## 2016-01-31 MED ORDER — ACETAMINOPHEN 500 MG PO TABS
1000.0000 mg | ORAL_TABLET | Freq: Four times a day (QID) | ORAL | Status: DC | PRN
  Administered 2016-01-31: 1000 mg via ORAL
  Filled 2016-01-31: qty 2

## 2016-01-31 MED ORDER — FAMOTIDINE IN NACL 20-0.9 MG/50ML-% IV SOLN
20.0000 mg | Freq: Once | INTRAVENOUS | Status: AC
  Administered 2016-01-31: 20 mg via INTRAVENOUS

## 2016-01-31 MED ORDER — DIPHENHYDRAMINE HCL 50 MG/ML IJ SOLN
INTRAMUSCULAR | Status: AC
  Administered 2016-01-31: 25 mg via INTRAVENOUS
  Filled 2016-01-31: qty 1

## 2016-01-31 MED ORDER — SODIUM CHLORIDE 0.9 % IV SOLN
INTRAVENOUS | Status: AC
  Administered 2016-01-31: 20:00:00 via INTRAVENOUS

## 2016-01-31 MED ORDER — INSULIN ASPART 100 UNIT/ML ~~LOC~~ SOLN
0.0000 [IU] | Freq: Three times a day (TID) | SUBCUTANEOUS | Status: DC
  Administered 2016-01-31: 3 [IU] via SUBCUTANEOUS
  Administered 2016-02-01: 07:00:00 1 [IU] via SUBCUTANEOUS

## 2016-01-31 MED ORDER — CALCIUM CARBONATE 1250 (500 CA) MG PO TABS
0.5000 | ORAL_TABLET | Freq: Two times a day (BID) | ORAL | Status: DC
  Filled 2016-01-31: qty 1

## 2016-01-31 MED ORDER — MECLIZINE HCL 25 MG PO TABS
25.0000 mg | ORAL_TABLET | Freq: Three times a day (TID) | ORAL | Status: DC
  Administered 2016-01-31 – 2016-02-01 (×3): 25 mg via ORAL
  Filled 2016-01-31 (×4): qty 1

## 2016-01-31 MED ORDER — ONDANSETRON HCL 4 MG/2ML IJ SOLN: 4.0000 mg | Freq: Four times a day (QID) | INTRAMUSCULAR | Status: DC | PRN

## 2016-01-31 MED ORDER — MIDAZOLAM HCL 2 MG/2ML IJ SOLN
INTRAMUSCULAR | Status: DC | PRN
  Administered 2016-01-31: 1 mg via INTRAVENOUS

## 2016-01-31 MED ORDER — ASPIRIN 325 MG PO TABS: 325.0000 mg | ORAL_TABLET | Freq: Every day | ORAL | Status: DC

## 2016-01-31 MED ORDER — VITAMIN E 180 MG (400 UNIT) PO CAPS
400.0000 [IU] | ORAL_CAPSULE | Freq: Every day | ORAL | Status: DC
  Administered 2016-02-01: 09:00:00 400 [IU] via ORAL
  Filled 2016-01-31: qty 1

## 2016-01-31 MED ORDER — ATROPINE SULFATE 1 MG/ML IJ SOLN
0.5000 mg | Freq: Once | INTRAMUSCULAR | Status: AC
  Administered 2016-01-31: 0.5 mg via INTRAVENOUS

## 2016-01-31 MED ORDER — PRESERVISION AREDS PO CAPS: 1.0000 | ORAL_CAPSULE | Freq: Every day | ORAL | Status: DC

## 2016-01-31 MED ORDER — HEPARIN (PORCINE) IN NACL 2-0.9 UNIT/ML-% IJ SOLN
INTRAMUSCULAR | Status: DC | PRN
  Administered 2016-01-31: 1000 mL

## 2016-01-31 MED ORDER — OLMESARTAN MEDOXOMIL 20 MG PO TABS
20.0000 mg | ORAL_TABLET | Freq: Two times a day (BID) | ORAL | Status: DC
  Administered 2016-01-31 – 2016-02-01 (×2): 20 mg via ORAL
  Filled 2016-01-31 (×2): qty 1

## 2016-01-31 MED ORDER — CLONIDINE HCL 0.1 MG PO TABS
0.1000 mg | ORAL_TABLET | Freq: Three times a day (TID) | ORAL | Status: DC
  Administered 2016-01-31 – 2016-02-01 (×2): 0.1 mg via ORAL
  Filled 2016-01-31 (×2): qty 1

## 2016-01-31 MED ORDER — METHYLPREDNISOLONE SODIUM SUCC 125 MG IJ SOLR
INTRAMUSCULAR | Status: AC
  Filled 2016-01-31: qty 2

## 2016-01-31 MED ORDER — COD LIVER OIL PO CAPS: 1.0000 | ORAL_CAPSULE | Freq: Every day | ORAL | Status: DC

## 2016-01-31 MED ORDER — ANGIOPLASTY BOOK
Freq: Once | Status: AC
  Administered 2016-01-31: 23:00:00
  Filled 2016-01-31: qty 1

## 2016-01-31 MED ORDER — METHYLPREDNISOLONE SODIUM SUCC 125 MG IJ SOLR
125.0000 mg | Freq: Once | INTRAMUSCULAR | Status: AC
  Administered 2016-01-31: 125 mg via INTRAVENOUS

## 2016-01-31 MED ORDER — CALCIUM CARBONATE 1500 (600 CA) MG PO TABS
600.0000 mg | ORAL_TABLET | Freq: Two times a day (BID) | ORAL | Status: DC
  Filled 2016-01-31: qty 1

## 2016-01-31 MED ORDER — ACETAMINOPHEN 500 MG PO TABS: 500.0000 mg | ORAL_TABLET | Freq: Four times a day (QID) | ORAL | Status: DC | PRN

## 2016-01-31 MED ORDER — DENOSUMAB 60 MG/ML ~~LOC~~ SOLN: 60.0000 mg | SUBCUTANEOUS | Status: DC

## 2016-01-31 MED ORDER — ASPIRIN 81 MG PO CHEW
CHEWABLE_TABLET | ORAL | Status: AC
  Filled 2016-01-31: qty 1

## 2016-01-31 MED ORDER — METHIMAZOLE 5 MG PO TABS
5.0000 mg | ORAL_TABLET | Freq: Every day | ORAL | Status: DC
  Administered 2016-02-01: 5 mg via ORAL
  Filled 2016-01-31: qty 1

## 2016-01-31 SURGICAL SUPPLY — 21 items
BALLN ANGIOSCULPT OTW 5.0X20 (BALLOONS) ×3
BALLN TREK RX 2.5X15 (BALLOONS) ×3
BALLN VIATRAC 4X20X135 (BALLOONS) ×3
BALLOON ANGIOSCULPT OTW 5.0X20 (BALLOONS) ×2 IMPLANT
BALLOON TREK RX 2.5X15 (BALLOONS) ×2 IMPLANT
BALLOON VIATRAC 4X20X135 (BALLOONS) ×2 IMPLANT
CATH ANGIO 5F PIGTAIL 65CM (CATHETERS) ×3 IMPLANT
GUIDE CATH VISTA IMA 6F (CATHETERS) ×3 IMPLANT
GUIDE CATH VISTA JR4 6F (CATHETERS) ×3 IMPLANT
KIT ENCORE 26 ADVANTAGE (KITS) ×3 IMPLANT
KIT PV (KITS) ×3 IMPLANT
SHEATH PINNACLE 6F 10CM (SHEATH) ×3 IMPLANT
STENT HERCULINK RX 5.0X15X135 (Permanent Stent) ×3 IMPLANT
STOPCOCK MORSE 400PSI 3WAY (MISCELLANEOUS) ×6 IMPLANT
SYR MEDRAD MARK V 150ML (SYRINGE) ×3 IMPLANT
TRANSDUCER W/STOPCOCK (MISCELLANEOUS) ×3 IMPLANT
TRAY PV CATH (CUSTOM PROCEDURE TRAY) ×3 IMPLANT
TUBING CIL FLEX 10 FLL-RA (TUBING) ×6 IMPLANT
WIRE HITORQ VERSACORE ST 145CM (WIRE) ×3 IMPLANT
WIRE SPARTACORE .014X300CM (WIRE) ×3 IMPLANT
WIRE STABILIZER XS .014X180CM (WIRE) ×3 IMPLANT

## 2016-01-31 NOTE — H&P (View-Only) (Signed)
01/18/2016 [name]   [birth year]  [id number]  Primary Physician Irven Shelling, MD Primary Cardiologist: Lorretta Harp MD Renae Gloss  HPI:  [name] is a delightful 82 year old female fell appearing married Caucasian female accompanied by her husband [name]. I last saw her in the office 11/28/15. She was referred by Dr. Tamala Julian for peripheral vascular evaluation because of difficult call to control hypertension the setting of a occluded left renal artery stent. She had a left renal artery stent placed by Dr. Irish Lack in 2012 for renal vascular hypertension. After that her blood pressure was much better controlled. She also has a history of coronary artery disease with a known occluded RCA with normal LV function and she is a symptomatically from this. Her blood pressures were recorded in the controlled after the renal artery stent implantation for several years however last year she began to develop more difficult to control blood pressures. Renal Dopplers however did not suggest renal artery stenosis and in addition showed that her renal dimensions were symmetric. Dr. Fletcher Anon performed angiography on her 12/20/14 demonstrating an occluded left renal artery stent although there appeared to be a perfusion in the renal artery beyond this and the renal dimension remained fairly normal at 9 cm. her most recent renal Doppler studies performed 12/07/15 again revealed preservation of her left renal dimensions. Her blood pressures have been significantly elevated and she wishes to proceed with angiography and potential intervention.    Current Outpatient Prescriptions  Medication Sig Dispense Refill  . aspirin 325 MG tablet Take 325 mg by mouth at bedtime.    . betamethasone dipropionate (DIPROLENE) 0.05 % cream Apply 1 application topically daily as needed (irritated skin).    . Calcium Citrate (CITRACAL PO) Take 1,200 mg by mouth daily.    . carvedilol (COREG) 12.5 MG tablet Take  1 tablet (12.5 mg total) by mouth 2 (two) times daily with a meal. 60 tablet 5  . cholecalciferol (VITAMIN D) 1000 UNITS tablet Take 2,000 Units by mouth daily.     . cloNIDine (CATAPRES) 0.1 MG tablet Take 0.1 mg by mouth 3 (three) times daily with meals.    . cloNIDine (CATAPRES) 0.2 MG tablet Take 0.2 mg by mouth at bedtime.    [name] Cod Liver Oil CAPS Take 1 capsule by mouth daily.    [name] denosumab (PROLIA) 60 MG/ML SOLN injection Inject 60 mg into the skin every 6 (six) months. Administer in upper arm, thigh, or abdomen    . docusate sodium (COLACE) 100 MG capsule Take 100 mg by mouth daily as needed for mild constipation.    [name] esomeprazole (NEXIUM) 40 MG capsule Take 80 mg by mouth 2 (two) times daily.     . fexofenadine (ALLEGRA) 180 MG tablet Take 180 mg by mouth daily.    . meclizine (ANTIVERT) 25 MG tablet Take 25 mg by mouth 3 (three) times daily.     . methimazole (TAPAZOLE) 5 MG tablet Take 5 mg by mouth daily.  11  . Multiple Vitamins-Minerals (PRESERVISION AREDS) CAPS Take 1 capsule by mouth once a week.     . nitroGLYCERIN (NITROSTAT) 0.4 MG SL tablet Place 0.4 mg under the tongue every 5 (five) minutes as needed for chest pain (3 doses MAX).    [name] olmesartan (BENICAR) 40 MG tablet Take 20 mg by mouth 2 (two) times daily.     [name] PREMARIN vaginal cream Place 1-2 g vaginally 2 (two) times a week.  4  . simvastatin (ZOCOR) 20 MG tablet Take 20 mg by mouth daily.    [name] VITAMIN E PO Take 1 capsule by mouth daily.     No current facility-administered medications for this visit.     Allergies  Allergen Reactions  . Fentanyl Anaphylaxis    Cardiac arrest and coded  . Fish Allergy Anaphylaxis and Rash  . Morphine And Related Anaphylaxis    Cardiac arrest and coded Per Dr. Conley Canal patient has taken percocet without issue  . Singulair [Montelukast] Palpitations and Other (See Comments)    Increased BP and HR  . Actonel [Risedronate Sodium] Rash and Other (See Comments)    weakness  .  Dilaudid [Hydromorphone Hcl] Rash and Other (See Comments)    Crying and screaming  . Fosamax [Alendronate Sodium] Rash and Other (See Comments)    Weakness and myalgias  . Hydralazine Diarrhea and Other (See Comments)    Swelling in stomach  . Lipitor [Atorvastatin] Swelling and Rash    Tongue swelling   . Lisinopril Diarrhea, Rash and Cough  . Micardis [Telmisartan] Diarrhea and Rash  . Septra [Sulfamethoxazole-Trimethoprim] Nausea And Vomiting and Rash  . Tekturna [Aliskiren] Swelling and Rash    Swelling of tongue  . Tricor [Fenofibrate] Rash and Other (See Comments)    Flu symptoms  . Amlodipine Diarrhea  . Avapro [Irbesartan] Rash  . Cardio Complete [Nutritional Supplements] Rash  . Cardizem [Diltiazem Hcl] Rash  . Ciprofloxacin Rash and Other (See Comments)    Black spot on body  . Codeine Rash  . Cyclobenzaprine Nausea Only  . Forteo [Teriparatide (Recombinant)] Rash  . Inapsine [Droperidol] Rash  . Ivp Dye [Iodinated Diagnostic Agents] Rash  . Shellfish Allergy Rash  . Tussionex Pennkinetic Er [Hydrocod Polst-Cpm Polst Er] Itching and Rash    Social History   Social History  . Marital status: Married    Spouse name: N/A  . Number of children: N/A  . Years of education: N/A   Occupational History  . Not on file.   Social History Main Topics  . Smoking status: Former Smoker    Packs/day: 1.00    Years: 30.00    Types: Cigarettes  . Smokeless tobacco: Never Used     Comment: "quit smoking in 1989"  . Alcohol use No  . Drug use: No  . Sexual activity: No   Other Topics Concern  . Not on file   Social History Narrative  . No narrative on file     Review of Systems: General: negative for chills, fever, night sweats or weight changes.  Cardiovascular: negative for chest pain, dyspnea on exertion, edema, orthopnea, palpitations, paroxysmal nocturnal dyspnea or shortness of breath Dermatological: negative for rash Respiratory: negative for cough or  wheezing Urologic: negative for hematuria Abdominal: negative for nausea, vomiting, diarrhea, bright red blood per rectum, melena, or hematemesis Neurologic: negative for visual changes, syncope, or dizziness All other systems reviewed and are otherwise negative except as noted above.    Blood pressure (!) 180/72, pulse 68, height 5\' 1"  (1.549 m), weight 113 lb 3.2 oz (51.3 kg).  General appearance: alert and no distress Neck: no adenopathy, no JVD, supple, symmetrical, trachea midline, thyroid not enlarged, symmetric, no tenderness/mass/nodules and Bilateral carotid bruits Lungs: clear to auscultation bilaterally Heart: Soft outflow tract murmur consistent with aortic stenosis Extremities: extremities normal, atraumatic, no cyanosis or edema  EKG not performed today  ASSESSMENT AND PLAN:   Renal artery stenosis [facility]) Mrs. Heyder has what  appears to be renal vascular hypertension. She had a renal artery stent placed by Dr. varicosity 2012 with marked improvement in her blood pressure after that almost immediately. Because of recurrent difficult to control hypertension she had angiography performed by Dr. Fletcher Anon 12/20/14 which seemed to indicate occlusion of the stent although on review the angiograms there may have been a small channel. Renal dimensions remain stable. She is on multiple antihypertensive medications and remains difficult to control with elevated blood pressures. She does have moderate renal insufficiency. Discussed options and have decided to proceed with re-angiography and potential intervention.      Lorretta Harp MD FACP,FACC,FAHA, [facility] 01/18/2016 4:47 PM

## 2016-01-31 NOTE — Progress Notes (Signed)
Genene Churn, RN notified of pts IVP allergy.

## 2016-01-31 NOTE — Interval H&P Note (Signed)
History and Physical Interval Note:  01/31/2016 12:42 PM  [name]  has presented today for surgery, with the diagnosis of renal artery stenosis  The various methods of treatment have been discussed with the patient and family. After consideration of risks, benefits and other options for treatment, the patient has consented to  Procedure(s): Renal Angiography (N/A) as a surgical intervention .  The patient's history has been reviewed, patient examined, no change in status, stable for surgery.  I have reviewed the patient's chart and labs.  Questions were answered to the patient's satisfaction.     Quay Burow

## 2016-01-31 NOTE — Progress Notes (Signed)
Site area: right groin  Site Prior to Removal:  Level 0  Pressure Applied For 20 MINUTES    Minutes Beginning at 1710  Manual:   Yes.    Patient Status During Pull:  1715 Patient had a vagal response when holding pressure. Heart rate 20's to 30's with blood pressure 65/34/ patient symptomatic, "I feel like I'm going to pass out.". Diaphoretic and pale. Cold cloth to head, slight trendelenberg position, O2 2 liters [place], and patient given atropine 0.5 mg IV. Patient tolerated remainder of hold after atropine given and vital signs improved at 1725. No further symptoms or signs.   Post Pull Groin Site:  Level 0  Post Pull Instructions Given:  Yes.    Post Pull Pulses Present:  Yes.    Dressing Applied:  Yes.    Comments:  1730 Resting quietly, vital signs stable, no nausea or vomiting during pull or at this time. Sitting up to 30 degrees and eating dinner.

## 2016-02-01 ENCOUNTER — Other Ambulatory Visit: Payer: Self-pay | Admitting: Physician Assistant

## 2016-02-01 ENCOUNTER — Encounter (HOSPITAL_COMMUNITY): Payer: Self-pay | Admitting: Physician Assistant

## 2016-02-01 DIAGNOSIS — R001 Bradycardia, unspecified: Secondary | ICD-10-CM | POA: Diagnosis not present

## 2016-02-01 DIAGNOSIS — I701 Atherosclerosis of renal artery: Secondary | ICD-10-CM

## 2016-02-01 DIAGNOSIS — I10 Essential (primary) hypertension: Secondary | ICD-10-CM | POA: Diagnosis not present

## 2016-02-01 DIAGNOSIS — N183 Chronic kidney disease, stage 3 (moderate): Secondary | ICD-10-CM

## 2016-02-01 DIAGNOSIS — D649 Anemia, unspecified: Secondary | ICD-10-CM

## 2016-02-01 LAB — CBC
HCT: 30.2 % — ABNORMAL LOW (ref 36.0–46.0)
Hemoglobin: 10.1 g/dL — ABNORMAL LOW (ref 12.0–15.0)
MCH: 28.1 pg (ref 26.0–34.0)
MCHC: 33.4 g/dL (ref 30.0–36.0)
MCV: 84.1 fL (ref 78.0–100.0)
PLATELETS: 216 10*3/uL (ref 150–400)
RBC: 3.59 MIL/uL — ABNORMAL LOW (ref 3.87–5.11)
RDW: 13.5 % (ref 11.5–15.5)
WBC: 9.6 10*3/uL (ref 4.0–10.5)

## 2016-02-01 LAB — BASIC METABOLIC PANEL
Anion gap: 6 (ref 5–15)
BUN: 16 mg/dL (ref 6–20)
CALCIUM: 8.1 mg/dL — AB (ref 8.9–10.3)
CO2: 23 mmol/L (ref 22–32)
CREATININE: 1.08 mg/dL — AB (ref 0.44–1.00)
Chloride: 105 mmol/L (ref 101–111)
GFR calc Af Amer: 54 mL/min — ABNORMAL LOW (ref >60)
GFR, EST NON AFRICAN AMERICAN: 47 mL/min — AB (ref >60)
Glucose, Bld: 133 mg/dL — ABNORMAL HIGH (ref 65–99)
POTASSIUM: 4 mmol/L (ref 3.5–5.1)
SODIUM: 134 mmol/L — AB (ref 135–145)

## 2016-02-01 LAB — GLUCOSE, CAPILLARY: GLUCOSE-CAPILLARY: 149 mg/dL — AB (ref 65–99)

## 2016-02-01 MED ORDER — CARVEDILOL 3.125 MG PO TABS: 6.2500 mg | ORAL_TABLET | Freq: Two times a day (BID) | ORAL | Status: DC

## 2016-02-01 MED ORDER — CLOPIDOGREL BISULFATE 75 MG PO TABS: 75.0000 mg | ORAL_TABLET | Freq: Every day | ORAL | 11 refills | Status: DC

## 2016-02-01 MED ORDER — ASPIRIN 81 MG PO TABS: 81.0000 mg | ORAL_TABLET | Freq: Every day | ORAL | 11 refills | Status: DC

## 2016-02-01 MED ORDER — PANTOPRAZOLE SODIUM 40 MG PO TBEC: 40.0000 mg | DELAYED_RELEASE_TABLET | Freq: Two times a day (BID) | ORAL | 1 refills | Status: DC

## 2016-02-01 MED ORDER — CARVEDILOL 6.25 MG PO TABS: 6.2500 mg | ORAL_TABLET | Freq: Two times a day (BID) | ORAL | 1 refills | Status: DC

## 2016-02-01 NOTE — Discharge Summary (Signed)
Discharge Summary    Patient ID: [name],  MRN: [id number], DOB/AGE: 80/04/1931 80 y.o.  Admit date: 01/31/2016 Discharge date: 02/01/2016  Primary Care Provider: Irven Shelling Primary Cardiologist: Dr. Tamala Julian, PV - Dr. [name]  Discharge Diagnoses    Principal Problem:   Left renal artery stenosis [facility]) Active Problems:   DM (diabetes mellitus) ([place])   CAD (coronary artery disease)   HTN (hypertension)   Hyperlipidemia   CKD (chronic kidney disease), stage III   Anemia   Bradycardia    Diagnostic Studies/Procedures    1. PVcatheterization this admission, please see full report and below for summary. _____________   History of Present Illness & Hospital Course    [phone] with CAD (RV infarct 2007 c/b high grade AV block with total occlusion of RCA, unable to treat with PCI), renal artery stenosis s/p left renal stent 2012, prior strokes on head CT, carotid disease, DM, labile HTN, Meniere's disease, hyperthyroidism, high cholesterol, hiatal hernia, GERD, dyslipidemia,  chronic back pain, CKD stage III, syncope/near-syncope, chronic appearing anemia admitted for planned PV angio due to recurrent renovascular hypertension. Also had recent eval for near-syncope/syncope with 2D Echo 09/24/15: EF 45-50%, possible hypokinesis of basal-mid lateral myocardium, mild AS, mild MR, mildly dilated LA/RA, PASP 66mmHG, pirior infarct but no ischemia by nuc 10/2015, event monitor showing HR down to the low 40s 09/2015 requiring decrease in Coreg to 12.5mg  BID with decreased frequency of near-syncopal spells.  She was admitted 01/31/16 for planned PV angio given abnormal dopplers suggesting occlusion of left renal artery stent. She underwent PV angio 02/01/16 s/p L renal artery PTA and stent. She was started on Plavix and aspirin was decreased to 81mg . Post-PV angio, had vagal event with dropped BP and HR, resolved with atropine. She was observed on telemetry overnight and did well without  further vagal reaction. This morning she states "I feel the best I have in a year." Post-PV Cr is stable at 1.08 (1.29 pre-cath). She was noted to have further brief bradycardia with HR down to upper 30s, asymptomatic, so her beta blocker will be cut back further to 6.25mg  BID and restarted in AM. She was asked to only restart if HR is >60 and to call office if lower than that. Her Nexium was changed to Protonix given interaction with Prilosec. She follows her BP and HR closely at home and will notify us of any recurrent bradycardia or decreasing BPs now that she's had renal intervention. Dr. Irish Lack has seen and examined the patient today and feels she is stable for discharge.  She will have renal duplex in the office and f/u in 2-3 weeks after. I have sent a message to our scheduler requesting these appointments, and our office will call the patient with this information (scheduling line was busy by telephone).  _____________  Discharge Vitals Blood pressure (!) 166/52, pulse 63, temperature 97.8 F (36.6 C), temperature source Oral, resp. rate 20, height 5\' 1"  (1.549 m), weight 112 lb 10.5 oz (51.1 kg), SpO2 98 %.  Filed Weights   01/31/16 1023 02/01/16 0418  Weight: 111 lb (50.3 kg) 112 lb 10.5 oz (51.1 kg)    Labs & Radiologic Studies    CBC  Recent Labs  02/01/16 0533  WBC 9.6  HGB 10.1*  HCT 30.2*  MCV 84.1  PLT 123XX123   Basic Metabolic Panel  Recent Labs  02/01/16 0533  NA 134*  K 4.0  CL 105  CO2 23  GLUCOSE 133*  BUN  16  CREATININE 1.08*  CALCIUM 8.1*  _____________  No results found. Disposition   Pt is being discharged home today in good condition.  Follow-up Plans & Appointments     Discharge Instructions    Diet - low sodium heart healthy    Complete by:  As directed    Increase activity slowly    Complete by:  As directed    Your carvedilol dose was decreased to a new lower tablet size - please start tomorrow morning if your heart rate is greater  than 60. If it is less than 60, call the office for further instructions.  Your aspirin dose was decreased to 81mg  daily (baby aspirin) because you were started on another blood thinner called clopidogrel (Plavix).  Some studies suggest Nexium interacts with Plavix. We changed your Nexium to Protonix for less chance of interaction. Please follow up with the doctor that typically prescribes this medicine for further refills.  No driving until cleared by your heart doctor. No lifting over 5 lbs for 1 week. No sexual activity for 1 week. Keep procedure site clean & dry. If you notice increased pain, swelling, bleeding or pus, call/return!  You may shower, but no soaking baths/hot tubs/pools for 1 week.      Discharge Medications     Medication List    STOP taking these medications   esomeprazole 40 MG capsule Commonly known as:  [place] these medications   acetaminophen 500 MG tablet Commonly known as:  TYLENOL Take 500 mg by mouth every 6 (six) hours as needed (for pain.).   aspirin 81 MG tablet Take 1 tablet (81 mg total) by mouth daily. What changed:  medication strength  how much to take  when to take this   azelastine 0.1 % nasal spray Commonly known as:  ASTELIN Place 1-2 sprays into both nostrils daily as needed. For allergies/congestion.   betamethasone valerate lotion 0.1 % Commonly known as:  VALISONE Apply 1 application topically 2 (two) times daily as needed. For irritated skin.   CALCIUM 600 1500 (600 Ca) MG Tabs tablet Generic drug:  calcium carbonate Take 600 mg by mouth 2 (two) times daily.   carvedilol 6.25 MG tablet Commonly known as:  COREG Take 1 tablet (6.25 mg total) by mouth 2 (two) times daily. Start taking on:  02/02/2016 What changed:  medication strength  how much to take   cholecalciferol 1000 units tablet Commonly known as:  VITAMIN D Take 2,000 Units by mouth daily.   cloNIDine 0.1 MG tablet Commonly known as:   CATAPRES Take 0.1 mg by mouth 3 (three) times daily with meals.   cloNIDine 0.2 MG tablet Commonly known as:  CATAPRES Take 0.2 mg by mouth at bedtime.   clopidogrel 75 MG tablet Commonly known as:  PLAVIX Take 1 tablet (75 mg total) by mouth daily.   Cod Liver Oil Caps Take 1 capsule by mouth daily.   denosumab 60 MG/ML Soln injection Commonly known as:  PROLIA Inject 60 mg into the skin every 6 (six) months. Administer in upper arm, thigh, or abdomen   docusate sodium 100 MG capsule Commonly known as:  COLACE Take 100 mg by mouth every evening.   fexofenadine 180 MG tablet Commonly known as:  ALLEGRA Take 180 mg by mouth daily.   meclizine 25 MG tablet Commonly known as:  ANTIVERT Take 25 mg by mouth 3 (three) times daily.   methimazole 5 MG tablet Commonly known as:  TAPAZOLE Take 5 mg by mouth daily.   nitroGLYCERIN 0.4 MG SL tablet Commonly known as:  NITROSTAT Place 0.4 mg under the tongue every 5 (five) minutes as needed for chest pain (3 doses MAX).   olmesartan 40 MG tablet Commonly known as:  BENICAR Take 20 mg by mouth 2 (two) times daily.   pantoprazole 40 MG tablet Commonly known as:  PROTONIX Take 1 tablet (40 mg total) by mouth 2 (two) times daily before a meal.   PREMARIN vaginal cream Generic drug:  conjugated estrogens Place 1-2 g vaginally every Wednesday and Saturday.   PRESERVISION AREDS Caps Take 1 capsule by mouth daily.   simvastatin 20 MG tablet Commonly known as:  ZOCOR Take 20 mg by mouth daily.   vitamin E 400 UNIT capsule Take 400 Units by mouth daily.        Allergies:  Allergies  Allergen Reactions  . Fentanyl Anaphylaxis    Cardiac arrest and coded  . Fish Allergy Anaphylaxis and Rash  . Morphine And Related Anaphylaxis    Cardiac arrest and coded Per Dr. Conley Canal patient has taken percocet without issue  . Singulair [Montelukast] Palpitations and Other (See Comments)    Increased BP and HR  . Actonel  [Risedronate Sodium] Rash and Other (See Comments)    weakness  . Dilaudid [Hydromorphone Hcl] Rash and Other (See Comments)    Crying and screaming  . Fosamax [Alendronate Sodium] Rash and Other (See Comments)    Weakness and myalgias  . Hydralazine Diarrhea and Other (See Comments)    Swelling in stomach  . Lipitor [Atorvastatin] Swelling and Rash    Tongue swelling   . Lisinopril Diarrhea, Rash and Cough  . Micardis [Telmisartan] Diarrhea and Rash  . Septra [Sulfamethoxazole-Trimethoprim] Nausea And Vomiting and Rash  . Tekturna [Aliskiren] Swelling and Rash    Swelling of tongue  . Tricor [Fenofibrate] Rash and Other (See Comments)    Flu symptoms  . Amlodipine Diarrhea  . Avapro [Irbesartan] Rash  . Cardio Complete [Nutritional Supplements] Rash  . Cardizem [Diltiazem Hcl] Rash  . Ciprofloxacin Rash and Other (See Comments)    Black spot on body  . Codeine Rash  . Cyclobenzaprine Nausea Only  . Forteo [Teriparatide (Recombinant)] Rash  . Inapsine [Droperidol] Rash  . Ivp Dye [Iodinated Diagnostic Agents] Rash  . Shellfish Allergy Rash  . Tussionex Pennkinetic Er [Hydrocod Polst-Cpm Polst Er] Itching and Rash    Outstanding Labs/Studies   NA  Duration of Discharge Encounter   Greater than 30 minutes including physician time.  Signed, Charlie Pitter PA-C 02/01/2016, 10:21 AM   I have examined the patient and reviewed assessment and plan and discussed with patient.  Agree with above as stated.  Will decrease Carvedilol to 6.25 BID.  Stressed importance of Plavix with new renal stent placed.  Watch BP.  Right groin stable.  If BP started to drop, would wean off clonidine first since she has dry mouth side effect.  Plan discharge today.  Larae Grooms

## 2016-02-01 NOTE — Progress Notes (Signed)
Patient Name: [name] Date of Encounter: 02/01/2016  Primary Cardiologist: Dr. Tamala Julian / Dr. Serena Croissant Problem List     Principal Problem:   Left renal artery stenosis [facility]) Active Problems:   DM (diabetes mellitus) ([place])   CAD (coronary artery disease)   HTN (hypertension)   Hyperlipidemia   CKD (chronic kidney disease), stage III   Anemia    Subjective   "I feel the best I have in a year." No complaints.  Inpatient Medications    . aspirin EC  81 mg Oral Daily  . calcium carbonate  0.5 tablet Oral BID WC  . carvedilol  12.5 mg Oral BID  . cholecalciferol  2,000 Units Oral Daily  . cloNIDine  0.1 mg Oral TID WC  . cloNIDine  0.2 mg Oral QHS  . clopidogrel  75 mg Oral Q breakfast  . [START ON 02/02/2016] conjugated estrogens  1-2 g Vaginal Q Wed,Sat  . docusate sodium  100 mg Oral QPM  . insulin aspart  0-9 Units Subcutaneous TID WC  . meclizine  25 mg Oral TID  . methimazole  5 mg Oral Daily  . olmesartan  20 mg Oral BID  . pantoprazole  40 mg Oral BID  . simvastatin  20 mg Oral Daily  . vitamin E  400 Units Oral Daily    Vital Signs    Vitals:   01/31/16 2000 01/31/16 2100 02/01/16 0418 02/01/16 0728  BP: (!) 160/52 (!) 149/55 (!) 165/60 (!) 166/52  Pulse: 67 68 69 63  Resp: 20 20 15 20   Temp:   98.3 F (36.8 C) 97.8 F (36.6 C)  TempSrc:   Oral Oral  SpO2: 95% 96% 97% 98%  Weight:   112 lb 10.5 oz (51.1 kg)   Height:        Intake/Output Summary (Last 24 hours) at 02/01/16 0912 Last data filed at 02/01/16 0700  Gross per 24 hour  Intake           1117.5 ml  Output             1110 ml  Net              7.5 ml   Filed Weights   01/31/16 1023 02/01/16 0418  Weight: 111 lb (50.3 kg) 112 lb 10.5 oz (51.1 kg)    Physical Exam    General: Well developed, well nourished WF in no acute distress. HEENT: Normocephalic, atraumatic, sclera non-icteric, no xanthomas, nares are without discharge. Neck: Negative for carotid bruits. JVP not  elevated. Lungs: Clear bilaterally to auscultation without wheezes, rales, or rhonchi. Breathing is unlabored. Cardiac: RRR S1 S2 without murmurs, rubs, or gallops.  Abdomen: Soft, non-tender, non-distended with normoactive bowel sounds. No rebound/guarding. Extremities: No clubbing or cyanosis. No edema. Distal pedal pulses are 2+ and equal bilaterally. Skin: Warm and dry, no significant rash. Right groin groin cath site without hematoma or bruit. Minimal ecchymosis. Neuro: Alert and oriented X 3. Strength and sensation in tact. Psych:  Responds to questions appropriately with a normal affect.  Labs    CBC  Recent Labs  02/01/16 0533  WBC 9.6  HGB 10.1*  HCT 30.2*  MCV 84.1  PLT 123XX123   Basic Metabolic Panel  Recent Labs  02/01/16 0533  NA 134*  K 4.0  CL 105  CO2 23  GLUCOSE 133*  BUN 16  CREATININE 1.08*  CALCIUM 8.1*   Telemetry    NSR, brief sinus  pause 1.5 sec this AM, vagal event yesterday with bradycardia and HR down to the 30s with sheath pull  Radiology    No results found.   Patient Profile     19F with CAD (RV infarct 2007 c/b high grade AV block with total occlusion of RCA, unable to treat with PCI), renal artery stenosis s/p left renal stent 2012, prior strokes on head CT, DM, labile HTN, Meniere's disease, hyperthyroidism, high cholesterol, hiatal hernia, GERD, dyslipidemia,  chronic back pain, CKD stage III, syncope/near-syncope, chronic appearing anemia admitted for planned PV angio due to recurrent renovascular hypertension. Also had recent eval for near-syncope/syncope with 2D Echo 09/24/15: EF 45-50%, possible hypokinesis of basal-mid lateral myocardium, mild AS, mild MR, mildly dilated LA/RA, PASP 64mmHG, pirior infarct but no ischemia by nuc 10/2015, event monitor showing HR down to the low 40s 09/2015 requiring decrease in BB.  PV angio 02/01/16 s/p L renal artery PTA and stent. Post-PV angio, had vagal event with dropped BP and HR.  Assessment &  Plan    1. Renal artery stenosis - s/p repeat renal artery stenting. Now on ASA/Plavix.  2. Renovascular HTN - will review regimen with MD.  3. CKD stage III - Cr stable post-PV angio.  4. H/o near-syncope - has not had any further episodes since reducing BB.  Signed, Charlie Pitter, PA-C  02/01/2016, 9:12 AM   I have examined the patient and reviewed assessment and plan and discussed with patient.  Agree with above as stated.  Please see d/c summary.  Larae Grooms

## 2016-02-05 ENCOUNTER — Other Ambulatory Visit: Payer: Self-pay | Admitting: Cardiovascular Disease

## 2016-02-05 DIAGNOSIS — I701 Atherosclerosis of renal artery: Secondary | ICD-10-CM

## 2016-02-14 ENCOUNTER — Telehealth: Payer: Self-pay | Admitting: Cardiovascular Disease

## 2016-02-14 NOTE — Telephone Encounter (Signed)
Closed encounter °

## 2016-02-15 ENCOUNTER — Ambulatory Visit (HOSPITAL_COMMUNITY)
Admission: RE | Admit: 2016-02-15 | Discharge: 2016-02-15 | Disposition: A | Discharge status: Home / Self Care | Payer: Medicare Other | Source: Ambulatory Visit | Attending: Cardiology | Admitting: Cardiology

## 2016-02-15 DIAGNOSIS — I701 Atherosclerosis of renal artery: Secondary | ICD-10-CM

## 2016-02-19 ENCOUNTER — Telehealth: Payer: Self-pay | Admitting: *Deleted

## 2016-02-19 DIAGNOSIS — I701 Atherosclerosis of renal artery: Secondary | ICD-10-CM

## 2016-02-19 NOTE — Telephone Encounter (Signed)
-----   Message from Lorretta Harp, MD sent at 02/19/2016  1:08 PM EST ----- Patent left renal artery stent. Repeat Doppler 6 months

## 2016-02-22 ENCOUNTER — Ambulatory Visit: Payer: Medicare Other | Admitting: Cardiovascular Disease

## 2016-02-27 ENCOUNTER — Encounter: Payer: Self-pay | Admitting: Cardiovascular Disease

## 2016-02-27 ENCOUNTER — Ambulatory Visit (INDEPENDENT_AMBULATORY_CARE_PROVIDER_SITE_OTHER): Payer: Medicare Other | Admitting: Cardiovascular Disease

## 2016-02-27 DIAGNOSIS — I679 Cerebrovascular disease, unspecified: Secondary | ICD-10-CM | POA: Insufficient documentation

## 2016-02-27 DIAGNOSIS — I701 Atherosclerosis of renal artery: Secondary | ICD-10-CM | POA: Diagnosis not present

## 2016-02-27 DIAGNOSIS — I739 Peripheral vascular disease, unspecified: Secondary | ICD-10-CM

## 2016-02-27 DIAGNOSIS — I779 Disorder of arteries and arterioles, unspecified: Secondary | ICD-10-CM | POA: Diagnosis not present

## 2016-02-27 NOTE — Patient Instructions (Signed)
Medication Instructions: Your physician recommends that you continue on your current medications as directed. Please refer to the Current Medication list given to you today.   Testing/Procedures: Your physician has requested that you have a renal artery duplex every 6 months. During this test, an ultrasound is used to evaluate blood flow to the kidneys. Allow one hour for this exam. Do not eat after midnight the day before and avoid carbonated beverages. Take your medications as you usually do.   Follow-Up: Your physician recommends that you schedule a follow-up appointment as needed with Dr. [name].  If you need a refill on your cardiac medications before your next appointment, please call your pharmacy.

## 2016-02-27 NOTE — Progress Notes (Signed)
02/27/2016 Earna Coder   Jan 31, 1932  [id number]  Primary Physician Irven Shelling, MD Primary Cardiologist: Lorretta Harp MD Renae Gloss  HPI:  Ms. Brietzke is a delightful 82 year old female fell appearing married Caucasian female accompanied by her husband Thayer Jew. I last saw her in the office 01/18/16. She was referred by Dr. Tamala Julian for peripheral vascular evaluation because of difficult call to control hypertension the setting of a occluded left renal artery stent. She had a left renal artery stent placed by Dr. Irish Lack in 2012 for renal vascular hypertension. After that her blood pressure was much better controlled. She also has a history of coronary artery disease with a known occluded RCA with normal LV function and she is a symptomatically from this. Her blood pressures were recorded in the controlled after the renal artery stent implantation for several years however last year she began to develop more difficult to control blood pressures. Renal Dopplers however did not suggest renal artery stenosis and in addition showed that her renal dimensions were symmetric. Dr. Fletcher Anon performed angiography on her 12/20/14 demonstrating an occluded left renal artery stent although there appeared to be a perfusion in the renal artery beyond this and the renal dimension remained fairly normal at 9 cm.her most recent renal Doppler studies performed 12/07/15 again revealed preservation of her left renal dimensions. Because of continued elevations in her blood pressure we decided to proceed with re-angiography and potential intervention which I did on 01/31/16. I did demonstrate a small channel in her low mostly obstructed left renal artery stent. I was able to restent her with a 5 mm x 15 mm long Herculink stent was stent with excellent angiographic and clinical result. Her postprocedure Dopplers revealed present bilaterally patent. Her blood pressures have been under much better  control    Current Outpatient Prescriptions  Medication Sig Dispense Refill  . acetaminophen (TYLENOL) 500 MG tablet Take 500 mg by mouth every 6 (six) hours as needed (for pain.).    [name] aspirin 81 MG tablet Take 1 tablet (81 mg total) by mouth daily. 30 tablet 11  . azelastine (ASTELIN) 0.1 % nasal spray Place 1-2 sprays into both nostrils daily as needed. For allergies/congestion.    . betamethasone valerate lotion (VALISONE) 0.1 % Apply 1 application topically 2 (two) times daily as needed. For irritated skin.    . calcium carbonate (CALCIUM 600) 1500 (600 Ca) MG TABS tablet Take 600 mg by mouth 2 (two) times daily.    . carvedilol (COREG) 6.25 MG tablet Take 1 tablet (6.25 mg total) by mouth 2 (two) times daily. 60 tablet 1  . cholecalciferol (VITAMIN D) 1000 UNITS tablet Take 2,000 Units by mouth daily.     . cloNIDine (CATAPRES) 0.1 MG tablet Take 0.1 mg by mouth 3 (three) times daily with meals.    . cloNIDine (CATAPRES) 0.2 MG tablet Take 0.2 mg by mouth at bedtime.    . clopidogrel (PLAVIX) 75 MG tablet Take 1 tablet (75 mg total) by mouth daily. 30 tablet 11  . Cod Liver Oil CAPS Take 1 capsule by mouth daily.    [name] denosumab (PROLIA) 60 MG/ML SOLN injection Inject 60 mg into the skin every 6 (six) months. Administer in upper arm, thigh, or abdomen    . docusate sodium (COLACE) 100 MG capsule Take 100 mg by mouth every evening.     . fexofenadine (ALLEGRA) 180 MG tablet Take 180 mg by mouth daily.    [name]  meclizine (ANTIVERT) 25 MG tablet Take 25 mg by mouth 3 (three) times daily.     . methimazole (TAPAZOLE) 5 MG tablet Take 5 mg by mouth daily.  11  . Multiple Vitamins-Minerals (PRESERVISION AREDS) CAPS Take 1 capsule by mouth daily.     . nitroGLYCERIN (NITROSTAT) 0.4 MG SL tablet Place 0.4 mg under the tongue every 5 (five) minutes as needed for chest pain (3 doses MAX).    [name] olmesartan (BENICAR) 40 MG tablet Take 20 mg by mouth 2 (two) times daily.     . pantoprazole (PROTONIX) 40  MG tablet Take 1 tablet (40 mg total) by mouth 2 (two) times daily before a meal. 60 tablet 1  . PREMARIN vaginal cream Place 1-2 g vaginally every Wednesday and Saturday.   4  . simvastatin (ZOCOR) 20 MG tablet Take 20 mg by mouth daily.    . vitamin E 400 UNIT capsule Take 400 Units by mouth daily.     No current facility-administered medications for this visit.     Allergies  Allergen Reactions  . Fentanyl Anaphylaxis    Cardiac arrest and coded  . Fish Allergy Anaphylaxis and Rash  . Morphine And Related Anaphylaxis    Cardiac arrest and coded Per Dr. Conley Canal patient has taken percocet without issue  . Singulair [Montelukast] Palpitations and Other (See Comments)    Increased BP and HR  . Actonel [Risedronate Sodium] Rash and Other (See Comments)    weakness  . Dilaudid [Hydromorphone Hcl] Rash and Other (See Comments)    Crying and screaming  . Fosamax [Alendronate Sodium] Rash and Other (See Comments)    Weakness and myalgias  . Hydralazine Diarrhea and Other (See Comments)    Swelling in stomach  . Lipitor [Atorvastatin] Swelling and Rash    Tongue swelling   . Lisinopril Diarrhea, Rash and Cough  . Micardis [Telmisartan] Diarrhea and Rash  . Septra [Sulfamethoxazole-Trimethoprim] Nausea And Vomiting and Rash  . Tekturna [Aliskiren] Swelling and Rash    Swelling of tongue  . Tricor [Fenofibrate] Rash and Other (See Comments)    Flu symptoms  . Amlodipine Diarrhea  . Avapro [Irbesartan] Rash  . Cardio Complete [Nutritional Supplements] Rash  . Cardizem [Diltiazem Hcl] Rash  . Ciprofloxacin Rash and Other (See Comments)    Black spot on body  . Codeine Rash  . Cyclobenzaprine Nausea Only  . Forteo [Teriparatide (Recombinant)] Rash  . Inapsine [Droperidol] Rash  . Ivp Dye [Iodinated Diagnostic Agents] Rash  . Shellfish Allergy Rash  . Tussionex Pennkinetic Er [Hydrocod Polst-Cpm Polst Er] Itching and Rash    Social History   Social History  . Marital status:  Married    Spouse name: N/A  . Number of children: N/A  . Years of education: N/A   Occupational History  . Not on file.   Social History Main Topics  . Smoking status: Former Smoker    Packs/day: 1.00    Years: 30.00    Types: Cigarettes  . Smokeless tobacco: Never Used     Comment: "quit smoking in 1989"  . Alcohol use No  . Drug use: No  . Sexual activity: Not on file   Other Topics Concern  . Not on file   Social History Narrative  . No narrative on file     Review of Systems: General: negative for chills, fever, night sweats or weight changes.  Cardiovascular: negative for chest pain, dyspnea on exertion, edema, orthopnea, palpitations, paroxysmal nocturnal dyspnea or  shortness of breath Dermatological: negative for rash Respiratory: negative for cough or wheezing Urologic: negative for hematuria Abdominal: negative for nausea, vomiting, diarrhea, bright red blood per rectum, melena, or hematemesis Neurologic: negative for visual changes, syncope, or dizziness All other systems reviewed and are otherwise negative except as noted above.    Blood pressure 118/62, pulse 64, height 5\' 1"  (1.549 m), weight 113 lb (51.3 kg), SpO2 98 %.  General appearance: alert and no distress Neck: no adenopathy, no JVD, supple, symmetrical, trachea midline, thyroid not enlarged, symmetric, no tenderness/mass/nodules and Soft bilateral carotid bruits Lungs: clear to auscultation bilaterally Heart: regular rate and rhythm, S1, S2 normal, no murmur, click, rub or gallop Extremities: extremities normal, atraumatic, no cyanosis or edema  EKG not performed today  ASSESSMENT AND PLAN:   Renal artery stenosis [facility]) Mrs. Munroe has a history of renal artery stenting for renal vascular high percent tension performed by Dr. Irish Lack in 2012. Because of the development of difficulty control high blood pressure she underwent angiography by Dr. Fletcher Anon 12/20/14 which suggested that her left renal  artery stent was totally occluded. He did not attempt to recanalize this and more aggressive medical therapy was recommended. She was referred back to me by Dr. Tamala Julian for a second opinion. When I review the angiograms I suspect that I saw a "with" of contrast and given the fact that her renal size was relatively stable I decided to restudy her which I did on 01/31/16. I did demonstrate a small channel and therefore was able to restent her with a 5 mm x 15 mm long Herculink balloon expandable stent. She had an excellent angiographic result. Her follow-up renal Doppler study revealed her stent to be widely patent. Blood pressure 7 under much better control. Whereas prior to stenting her blood pressures were in the 220-240/90 range currently her blood pressures average 140 over 60s. We will continue to follow her by duplex ultrasound on a semiannual basis.  Carotid artery disease ([place]) History of bilateral ICA stenosis demonstrated by duplex ultrasound 09/20/15 with mild to moderate right and moderate left ICA stenosis. This will be rechecked on annual basis.      Lorretta Harp MD FACP,FACC,FAHA, [facility] 02/27/2016 3:38 PM

## 2016-02-27 NOTE — Assessment & Plan Note (Signed)
History of bilateral ICA stenosis demonstrated by duplex ultrasound 09/20/15 with mild to moderate right and moderate left ICA stenosis. This will be rechecked on annual basis.

## 2016-02-27 NOTE — Assessment & Plan Note (Signed)
Mrs. Leinberger has a history of renal artery stenting for renal vascular high percent tension performed by Dr. Irish Lack in 2012. Because of the development of difficulty control high blood pressure she underwent angiography by Dr. Fletcher Anon 12/20/14 which suggested that her left renal artery stent was totally occluded. He did not attempt to recanalize this and more aggressive medical therapy was recommended. She was referred back to me by Dr. Tamala Julian for a second opinion. When I review the angiograms I suspect that I saw a "with" of contrast and given the fact that her renal size was relatively stable I decided to restudy her which I did on 01/31/16. I did demonstrate a small channel and therefore was able to restent her with a 5 mm x 15 mm long Herculink balloon expandable stent. She had an excellent angiographic result. Her follow-up renal Doppler study revealed her stent to be widely patent. Blood pressure 7 under much better control. Whereas prior to stenting her blood pressures were in the 220-240/90 range currently her blood pressures average 140 over 60s. We will continue to follow her by duplex ultrasound on a semiannual basis.

## 2016-03-23 ENCOUNTER — Other Ambulatory Visit: Payer: Self-pay | Admitting: Physician Assistant

## 2016-03-26 ENCOUNTER — Other Ambulatory Visit: Payer: Self-pay | Admitting: Physician Assistant

## 2016-05-07 ENCOUNTER — Other Ambulatory Visit: Payer: Self-pay | Admitting: Internal Medicine

## 2016-05-07 DIAGNOSIS — N63 Unspecified lump in unspecified breast: Secondary | ICD-10-CM

## 2016-05-14 ENCOUNTER — Other Ambulatory Visit: Payer: Self-pay | Admitting: Internal Medicine

## 2016-05-14 ENCOUNTER — Ambulatory Visit
Admission: RE | Admit: 2016-05-14 | Discharge: 2016-05-14 | Disposition: A | Discharge status: Home / Self Care | Payer: Medicare Other | Source: Ambulatory Visit | Attending: Internal Medicine | Admitting: Internal Medicine

## 2016-05-14 DIAGNOSIS — R928 Other abnormal and inconclusive findings on diagnostic imaging of breast: Secondary | ICD-10-CM

## 2016-05-14 DIAGNOSIS — N63 Unspecified lump in unspecified breast: Secondary | ICD-10-CM

## 2016-05-14 DIAGNOSIS — N6320 Unspecified lump in the left breast, unspecified quadrant: Secondary | ICD-10-CM

## 2016-05-22 ENCOUNTER — Other Ambulatory Visit: Payer: Self-pay | Admitting: Internal Medicine

## 2016-05-22 ENCOUNTER — Ambulatory Visit
Admission: RE | Admit: 2016-05-22 | Discharge: 2016-05-22 | Disposition: A | Discharge status: Home / Self Care | Payer: Medicare Other | Source: Ambulatory Visit | Attending: Internal Medicine | Admitting: Internal Medicine

## 2016-05-22 DIAGNOSIS — R928 Other abnormal and inconclusive findings on diagnostic imaging of breast: Secondary | ICD-10-CM

## 2016-05-22 DIAGNOSIS — N6320 Unspecified lump in the left breast, unspecified quadrant: Secondary | ICD-10-CM

## 2016-06-06 ENCOUNTER — Ambulatory Visit (INDEPENDENT_AMBULATORY_CARE_PROVIDER_SITE_OTHER): Payer: Medicare Other | Admitting: Interventional Cardiology

## 2016-06-06 ENCOUNTER — Encounter: Payer: Self-pay | Admitting: Interventional Cardiology

## 2016-06-06 VITALS — BP 132/72 | HR 64 | Ht 61.0 in | Wt 115.0 lb

## 2016-06-06 DIAGNOSIS — I495 Sick sinus syndrome: Secondary | ICD-10-CM | POA: Diagnosis not present

## 2016-06-06 DIAGNOSIS — I701 Atherosclerosis of renal artery: Secondary | ICD-10-CM

## 2016-06-06 DIAGNOSIS — I779 Disorder of arteries and arterioles, unspecified: Secondary | ICD-10-CM

## 2016-06-06 DIAGNOSIS — I2510 Atherosclerotic heart disease of native coronary artery without angina pectoris: Secondary | ICD-10-CM | POA: Diagnosis not present

## 2016-06-06 DIAGNOSIS — I10 Essential (primary) hypertension: Secondary | ICD-10-CM | POA: Diagnosis not present

## 2016-06-06 DIAGNOSIS — I739 Peripheral vascular disease, unspecified: Secondary | ICD-10-CM

## 2016-06-06 NOTE — Progress Notes (Signed)
Cardiology Office Note    Date:  06/06/2016   ID:  [name], DOB [birth year], MRN [id number]  PCP:  Irven Shelling, MD  Cardiologist: Sinclair Grooms, MD   Chief Complaint  Patient presents with  . Follow-up  . Shortness of Breath  . Bradycardia    History of Present Illness:  [name] is a 83 y.o. female coronary artery disease with known total occlusion of the right coronary, history of renal artery stenosis with left renal stent July 31, 2010 and recent angiogram suggesting total occlusion, severe and labile hypertension, mild aortic stenosis, hyperlipidemia, diabetes mellitus, and recent recurring syncopal episodes.   She is concerned because of intermittent episodes of bradycardia associated with feeling fatigued and weak. Blood pressures of been relatively stable. No chest pain. A monitor did demonstrate transient heart rates into the 30s and October. She denies dyspnea. She has increasing difficulty with ambulation due to balance.    Past Medical History:  Diagnosis Date  . Bradycardia    a. Coreg decreased due to HR low 40s on event monitor 07-31-15 -> further decreased due to HR upper 30s in 01/2016.  [name] CAD (coronary artery disease)    a. RV infarct 07-30-05 c/b high grade AV block with total occlusion of RCA, unable to treat with PCI. b. Nuc 10/2015: scar but no ischemia.  . Carotid artery disease ([place])   . Chronic anemia   . Chronic lower back pain   . CKD (chronic kidney disease), stage III   . Complication of anesthesia    "almost died when I had my breast reduction"  . Dyslipidemia   . GERD (gastroesophageal reflux disease)   . Heart murmur   . High cholesterol   . History of hiatal hernia   . HTN (hypertension)   . Hyperthyroidism   . Meniere disease    "for years" takes Antivert 3 times per day (01/31/2016)  . Myocardial infarction Jul 30, 2005   "almost died"  . Near syncope   . Osteoporosis   . Renal artery stenosis ([facility])    a. left renal artery stent  07/31/2010. b. s/p repeat left renal stenting 01/2016.  . Stroke [facility])    a. Prior infarcts seen on head CT.  [name] Type II diabetes mellitus ([place])    "controlled w/diet and exercise only" (01/31/2016)    Past Surgical History:  Procedure Laterality Date  . ANTERIOR CERVICAL DECOMP/DISCECTOMY FUSION  02/2002   Archie Endo 08/26/2010  . APPENDECTOMY    . BACK SURGERY    . BLADDER SUSPENSION  x3  . BREAST LUMPECTOMY  11/2007   breast needle-localized lumpectomy/notes 08/12/2012  . CARDIAC CATHETERIZATION  07-30-05   "couldn't get stents in; I almost died'  . CATARACT EXTRACTION W/ INTRAOCULAR LENS  IMPLANT, BILATERAL Bilateral   . CATARACT EXTRACTION W/ INTRAOCULAR LENS  IMPLANT, BILATERAL Bilateral   . CHOLECYSTECTOMY OPEN    . FRACTURE SURGERY    . HIP FRACTURE SURGERY Right 07-30-2009   "added rod in my leg and a ball"  . KYPHOPLASTY N/A 03/20/2015   Procedure: THORACIC TWELVE KYPHOPLASTY;  Surgeon: Jovita Gamma, MD;  Location: [place] [facility];  Service: Neurosurgery;  Laterality: N/A;  . KYPHOPLASTY N/A 04/23/2015   Procedure: KYPHOPLASTY - LUMBAR ONE;  Surgeon: Jovita Gamma, MD;  Location: [place] [facility];  Service: Neurosurgery;  Laterality: N/A;  L1 Kyphoplasty  . NECK HARDWARE REMOVAL  11/2002   "replaced screw"  . PERIPHERAL VASCULAR CATHETERIZATION Left 12/20/2014   Procedure: Renal  Angiography;  Surgeon: Wellington Hampshire, MD;  Location: [place] [facility];  Service: Cardiovascular;  Laterality: Left;  . PERIPHERAL VASCULAR CATHETERIZATION N/A 01/31/2016   Procedure: Renal Angiography;  Surgeon: Lorretta Harp, MD;  Location: [place] [facility] LAB;  Service: Cardiovascular;  Laterality: N/A;  . PERIPHERAL VASCULAR CATHETERIZATION N/A 01/31/2016   Procedure: Abdominal Aortogram;  Surgeon: Lorretta Harp, MD;  Location: [place] CV LAB;  Service: Cardiovascular;  Laterality: N/A;  . PERIPHERAL VASCULAR CATHETERIZATION  01/31/2016   Procedure: Peripheral Vascular Intervention;  Surgeon: Lorretta Harp,  MD;  Location: [place] [facility];  Service: Cardiovascular;;  . REDUCTION MAMMAPLASTY Bilateral 1985  . renal arteriogram  12/2014   occluded previous Lt renal stent and patent rt renal artery.  [name] RENAL ARTERY STENT Left 2012  . TONSILLECTOMY    . TUBAL LIGATION    . VAGINAL HYSTERECTOMY  ~ 1995  . WRIST FRACTURE SURGERY Left 2012    Current Medications: Outpatient Medications Prior to Visit  Medication Sig Dispense Refill  . acetaminophen (TYLENOL) 500 MG tablet Take 500 mg by mouth every 6 (six) hours as needed (for pain.).    [name] aspirin 81 MG tablet Take 1 tablet (81 mg total) by mouth daily. 30 tablet 11  . azelastine (ASTELIN) 0.1 % nasal spray Place 1-2 sprays into both nostrils daily as needed. For allergies/congestion.    . betamethasone valerate lotion (VALISONE) 0.1 % Apply 1 application topically 2 (two) times daily as needed. For irritated skin.    . calcium carbonate (CALCIUM 600) 1500 (600 Ca) MG TABS tablet Take 600 mg by mouth 2 (two) times daily.    . carvedilol (COREG) 6.25 MG tablet TAKE 1 TABLET BY MOUTH TWICE A DAY 60 tablet 7  . cholecalciferol (VITAMIN D) 1000 UNITS tablet Take 2,000 Units by mouth daily.     . cloNIDine (CATAPRES) 0.1 MG tablet Take 0.1 mg by mouth 3 (three) times daily with meals.    . cloNIDine (CATAPRES) 0.2 MG tablet Take 0.2 mg by mouth at bedtime.    . clopidogrel (PLAVIX) 75 MG tablet Take 1 tablet (75 mg total) by mouth daily. 30 tablet 11  . Cod Liver Oil CAPS Take 1 capsule by mouth daily.    [name] denosumab (PROLIA) 60 MG/ML SOLN injection Inject 60 mg into the skin every 6 (six) months. Administer in upper arm, thigh, or abdomen    . docusate sodium (COLACE) 100 MG capsule Take 100 mg by mouth every evening.     . fexofenadine (ALLEGRA) 180 MG tablet Take 180 mg by mouth daily.    . meclizine (ANTIVERT) 25 MG tablet Take 25 mg by mouth 3 (three) times daily.     . methimazole (TAPAZOLE) 5 MG tablet Take 5 mg by mouth daily.  11  . Multiple  Vitamins-Minerals (PRESERVISION AREDS) CAPS Take 1 capsule by mouth daily.     . nitroGLYCERIN (NITROSTAT) 0.4 MG SL tablet Place 0.4 mg under the tongue every 5 (five) minutes as needed for chest pain (3 doses MAX).    [name] olmesartan (BENICAR) 40 MG tablet Take 20 mg by mouth 2 (two) times daily.     . pantoprazole (PROTONIX) 40 MG tablet TAKE 1 TABLET BY MOUTH TWICE A DAY BEFORE A MEAL 60 tablet 11  . PREMARIN vaginal cream Place 1-2 g vaginally every Wednesday and Saturday.   4  . simvastatin (ZOCOR) 20 MG tablet Take 20 mg by mouth daily.    [name]  vitamin E 400 UNIT capsule Take 400 Units by mouth daily.     No facility-administered medications prior to visit.      Allergies:   Fentanyl; Fish allergy; Morphine and related; Singulair [montelukast]; Actonel [risedronate sodium]; Dilaudid [hydromorphone hcl]; Fosamax [alendronate sodium]; Hydralazine; Lipitor [atorvastatin]; Lisinopril; Micardis [telmisartan]; Septra [sulfamethoxazole-trimethoprim]; Tekturna [aliskiren]; Tricor [fenofibrate]; Amlodipine; Avapro [irbesartan]; Cardio complete [nutritional supplements]; Cardizem [diltiazem hcl]; Ciprofloxacin; Codeine; Cyclobenzaprine; Forteo [teriparatide (recombinant)]; Inapsine [droperidol]; Ivp dye [iodinated diagnostic agents]; Shellfish allergy; and Tussionex pennkinetic er [hydrocod polst-cpm polst er]   Social History   Social History  . Marital status: Married    Spouse name: N/A  . Number of children: N/A  . Years of education: N/A   Social History Main Topics  . Smoking status: Former Smoker    Packs/day: 1.00    Years: 30.00    Types: Cigarettes  . Smokeless tobacco: Never Used     Comment: "quit smoking in 1989"  . Alcohol use No  . Drug use: No  . Sexual activity: Not Asked   Other Topics Concern  . None   Social History Narrative  . None     Family History:  The patient's family history includes Alzheimer's disease in her sister; Congestive Heart Failure in her father;  Dementia in her brother; Diabetes type II in her brother, mother, sister, and sister; Healthy in her sister and sister; Heart attack in her mother and sister; Heart disease in her brother, mother, and sister; Kidney failure in her sister and sister; Other in her brother; Pulmonary disease in her sister; Pulmonary embolism in her father; Stroke in her brother and brother.   ROS:   Please see the history of present illness.    Hearing loss, irregular heartbeat, snoring, constipation, cough, and difficulty with balance  All other systems reviewed and are negative.   PHYSICAL EXAM:   VS:  BP 132/72 (BP Location: Left Arm)   Pulse 64   Ht 5\' 1"  (1.549 m)   Wt 115 lb (52.2 kg)   BMI 21.73 kg/m    GEN: Well nourished, well developed, in no acute distress  HEENT: normal  Neck: no JVD, carotid bruits, or masses Cardiac: RRR; 3/6 crescendo decrescendo systolic murmur, rubs, or gallops,no edema  Respiratory:  clear to auscultation bilaterally, normal work of breathing GI: soft, nontender, nondistended, + BS MS: no deformity or atrophy  Skin: warm and dry, no rash Neuro:  Alert and Oriented x 3, Strength and sensation are intact Psych: euthymic mood, full affect  Wt Readings from Last 3 Encounters:  06/06/16 115 lb (52.2 kg)  02/27/16 113 lb (51.3 kg)  02/01/16 112 lb 10.5 oz (51.1 kg)      Studies/Labs Reviewed:   EKG:  EKG  Sinus rhythm, biatrial abnormality, old inferior lateral infarct.  Recent Labs: 09/04/2015: B Natriuretic Peptide 400.7 01/21/2016: TSH 2.44 02/01/2016: BUN 16; Creatinine, Ser 1.08; Hemoglobin 10.1; Platelets 216; Potassium 4.0; Sodium 134   Lipid Panel    Component Value Date/Time   CHOL  05/10/2008 0635    177        ATP III CLASSIFICATION:  <200     mg/dL   Desirable  200-239  mg/dL   Borderline High  >=240    mg/dL   High          TRIG 546 (H) 05/10/2008 0635   HDL 31 (L) 05/10/2008 0635   CHOLHDL 5.7 05/10/2008 0635   VLDL UNABLE TO CALCULATE IF  TRIGLYCERIDE OVER  400 mg/dL 05/10/2008 0635   LDLCALC  05/10/2008 0635    UNABLE TO CALCULATE IF TRIGLYCERIDE OVER 400 mg/dL        Total Cholesterol/HDL:CHD Risk Coronary Heart Disease Risk Table                     Men   Women  1/2 Average Risk   3.4   3.3  Average Risk       5.0   4.4  2 X Average Risk   9.6   7.1  3 X Average Risk  23.4   11.0        Use the calculated Patient Ratio above and the CHD Risk Table to determine the patient's CHD Risk.        ATP III CLASSIFICATION (LDL):  <100     mg/dL   Optimal  100-129  mg/dL   Near or Above                    Optimal  130-159  mg/dL   Borderline  160-189  mg/dL   High  >190     mg/dL   Very High    Additional studies/ records that were reviewed today include:  June 2017 continuous monitor: Study Highlights     Underlying rhythm is NSR with HR range 37-114 bpm. Ave HR 65 bpm.  Syncope episode not associated with arrhythmia   Underlying basic rhythm is NSR with occasional bradycardia as low as 37 bpm. Syncope not associated with rhythm disturbance. Possible early Sick sinus given occasional bradycardia below 50 bpm.      ASSESSMENT:    1. Coronary artery disease involving native coronary artery of native heart without angina pectoris   2. Essential hypertension   3. Sinus node dysfunction (HCC)   4. Left renal artery stenosis (HCC)   5. Bilateral carotid artery disease ([place])      PLAN:  In order of problems listed above:  1. Asymptomatic with reference to angina. 2. Blood pressure is under reasonable control after recent left renal artery stenting by Dr. [name]. 3. Heart rates unexpectedly dipping into the 30s. This was documented by monitoring mid last year. We decreased the dose of carvedilol at that time but she continues to have these episodes and feels faint. We'll plan to wean and DC carvedilol completely. If she continues to have the episodes, clonidine will need to be discontinued. She may eventually  require permanent pacemaker therapy. 4.     Medication Adjustments/Labs and Tests Ordered: Current medicines are reviewed at length with the patient today.  Concerns regarding medicines are outlined above.  Medication changes, Labs and Tests ordered today are listed in the Patient Instructions below. Patient Instructions  Medication Instructions:  1) Take Carvedilol 3.125mg  twice daily for one week and then discontinue.  Labwork: None  Testing/Procedures: None  Follow-Up: Your physician wants you to follow-up in: 6 months with Dr. Tamala Julian. You will receive a reminder letter in the mail two months in advance. If you don't receive a letter, please call our office to schedule the follow-up appointment.   Any Other Special Instructions Will Be Listed Below (If Applicable).  Monitor your blood pressure and heart rate.  Please contact our office if your blood pressure is abnormal or if your heart rate remains low after stopping the Carvedilol.    If you need a refill on your cardiac medications before your next appointment, please call your pharmacy.  Signed, Sinclair Grooms, MD  06/06/2016 4:15 PM    [facility] [address], [place], [place]  [address] Phone: [phone]; Fax: [phone]

## 2016-06-06 NOTE — Patient Instructions (Signed)
Medication Instructions:  1) Take Carvedilol 3.125mg  twice daily for one week and then discontinue.  Labwork: None  Testing/Procedures: None  Follow-Up: Your physician wants you to follow-up in: 6 months with Dr. Tamala Julian. You will receive a reminder letter in the mail two months in advance. If you don't receive a letter, please call our office to schedule the follow-up appointment.   Any Other Special Instructions Will Be Listed Below (If Applicable).  Monitor your blood pressure and heart rate.  Please contact our office if your blood pressure is abnormal or if your heart rate remains low after stopping the Carvedilol.    If you need a refill on your cardiac medications before your next appointment, please call your pharmacy.

## 2016-06-09 ENCOUNTER — Ambulatory Visit: Payer: Self-pay | Admitting: Surgery

## 2016-06-14 ENCOUNTER — Telehealth: Payer: Self-pay | Admitting: Physician Assistant

## 2016-06-14 ENCOUNTER — Encounter (HOSPITAL_COMMUNITY): Payer: Self-pay | Admitting: Emergency Medicine

## 2016-06-14 ENCOUNTER — Observation Stay (HOSPITAL_COMMUNITY)
Admission: EM | Admit: 2016-06-14 | Discharge: 2016-06-16 | Disposition: A | Discharge status: Home / Self Care | Payer: Medicare Other | Attending: Family Medicine | Admitting: Family Medicine

## 2016-06-14 DIAGNOSIS — E871 Hypo-osmolality and hyponatremia: Secondary | ICD-10-CM | POA: Diagnosis not present

## 2016-06-14 DIAGNOSIS — Z79890 Hormone replacement therapy (postmenopausal): Secondary | ICD-10-CM | POA: Insufficient documentation

## 2016-06-14 DIAGNOSIS — Z7982 Long term (current) use of aspirin: Secondary | ICD-10-CM | POA: Insufficient documentation

## 2016-06-14 DIAGNOSIS — N183 Chronic kidney disease, stage 3 unspecified: Secondary | ICD-10-CM | POA: Diagnosis present

## 2016-06-14 DIAGNOSIS — D649 Anemia, unspecified: Secondary | ICD-10-CM | POA: Diagnosis not present

## 2016-06-14 DIAGNOSIS — H8109 Meniere's disease, unspecified ear: Secondary | ICD-10-CM | POA: Diagnosis not present

## 2016-06-14 DIAGNOSIS — E7800 Pure hypercholesterolemia, unspecified: Secondary | ICD-10-CM | POA: Diagnosis not present

## 2016-06-14 DIAGNOSIS — Z8673 Personal history of transient ischemic attack (TIA), and cerebral infarction without residual deficits: Secondary | ICD-10-CM | POA: Insufficient documentation

## 2016-06-14 DIAGNOSIS — Z7983 Long term (current) use of bisphosphonates: Secondary | ICD-10-CM | POA: Diagnosis not present

## 2016-06-14 DIAGNOSIS — I252 Old myocardial infarction: Secondary | ICD-10-CM | POA: Insufficient documentation

## 2016-06-14 DIAGNOSIS — Z7902 Long term (current) use of antithrombotics/antiplatelets: Secondary | ICD-10-CM | POA: Diagnosis not present

## 2016-06-14 DIAGNOSIS — E1122 Type 2 diabetes mellitus with diabetic chronic kidney disease: Secondary | ICD-10-CM | POA: Diagnosis not present

## 2016-06-14 DIAGNOSIS — I160 Hypertensive urgency: Principal | ICD-10-CM

## 2016-06-14 DIAGNOSIS — Z87891 Personal history of nicotine dependence: Secondary | ICD-10-CM | POA: Diagnosis not present

## 2016-06-14 DIAGNOSIS — Z885 Allergy status to narcotic agent status: Secondary | ICD-10-CM | POA: Insufficient documentation

## 2016-06-14 DIAGNOSIS — Z981 Arthrodesis status: Secondary | ICD-10-CM | POA: Diagnosis not present

## 2016-06-14 DIAGNOSIS — Z955 Presence of coronary angioplasty implant and graft: Secondary | ICD-10-CM | POA: Diagnosis not present

## 2016-06-14 DIAGNOSIS — K219 Gastro-esophageal reflux disease without esophagitis: Secondary | ICD-10-CM | POA: Insufficient documentation

## 2016-06-14 DIAGNOSIS — I208 Other forms of angina pectoris: Secondary | ICD-10-CM | POA: Diagnosis present

## 2016-06-14 DIAGNOSIS — R001 Bradycardia, unspecified: Secondary | ICD-10-CM | POA: Diagnosis present

## 2016-06-14 DIAGNOSIS — I2582 Chronic total occlusion of coronary artery: Secondary | ICD-10-CM | POA: Diagnosis not present

## 2016-06-14 DIAGNOSIS — B373 Candidiasis of vulva and vagina: Secondary | ICD-10-CM | POA: Diagnosis not present

## 2016-06-14 DIAGNOSIS — Z888 Allergy status to other drugs, medicaments and biological substances status: Secondary | ICD-10-CM | POA: Insufficient documentation

## 2016-06-14 DIAGNOSIS — I129 Hypertensive chronic kidney disease with stage 1 through stage 4 chronic kidney disease, or unspecified chronic kidney disease: Secondary | ICD-10-CM | POA: Diagnosis not present

## 2016-06-14 DIAGNOSIS — I701 Atherosclerosis of renal artery: Secondary | ICD-10-CM | POA: Diagnosis present

## 2016-06-14 DIAGNOSIS — Z79899 Other long term (current) drug therapy: Secondary | ICD-10-CM | POA: Insufficient documentation

## 2016-06-14 DIAGNOSIS — E119 Type 2 diabetes mellitus without complications: Secondary | ICD-10-CM

## 2016-06-14 DIAGNOSIS — I10 Essential (primary) hypertension: Secondary | ICD-10-CM | POA: Diagnosis present

## 2016-06-14 DIAGNOSIS — E785 Hyperlipidemia, unspecified: Secondary | ICD-10-CM | POA: Insufficient documentation

## 2016-06-14 DIAGNOSIS — R6884 Jaw pain: Secondary | ICD-10-CM | POA: Diagnosis present

## 2016-06-14 DIAGNOSIS — I2510 Atherosclerotic heart disease of native coronary artery without angina pectoris: Secondary | ICD-10-CM | POA: Diagnosis present

## 2016-06-14 DIAGNOSIS — E0590 Thyrotoxicosis, unspecified without thyrotoxic crisis or storm: Secondary | ICD-10-CM | POA: Insufficient documentation

## 2016-06-14 DIAGNOSIS — I25118 Atherosclerotic heart disease of native coronary artery with other forms of angina pectoris: Secondary | ICD-10-CM | POA: Diagnosis not present

## 2016-06-14 LAB — CBC WITH DIFFERENTIAL/PLATELET
BASOS ABS: 0 10*3/uL (ref 0.0–0.1)
Basophils Relative: 0 %
Eosinophils Absolute: 0.1 10*3/uL (ref 0.0–0.7)
Eosinophils Relative: 2 %
HEMATOCRIT: 30.6 % — AB (ref 36.0–46.0)
HEMOGLOBIN: 10.4 g/dL — AB (ref 12.0–15.0)
LYMPHS PCT: 25 %
Lymphs Abs: 1.9 10*3/uL (ref 0.7–4.0)
MCH: 27.7 pg (ref 26.0–34.0)
MCHC: 34 g/dL (ref 30.0–36.0)
MCV: 81.4 fL (ref 78.0–100.0)
MONOS PCT: 8 %
Monocytes Absolute: 0.6 10*3/uL (ref 0.1–1.0)
NEUTROS ABS: 4.9 10*3/uL (ref 1.7–7.7)
NEUTROS PCT: 65 %
Platelets: 218 10*3/uL (ref 150–400)
RBC: 3.76 MIL/uL — ABNORMAL LOW (ref 3.87–5.11)
RDW: 14.6 % (ref 11.5–15.5)
WBC: 7.5 10*3/uL (ref 4.0–10.5)

## 2016-06-14 LAB — I-STAT TROPONIN, ED: Troponin i, poc: 0.01 ng/mL (ref 0.00–0.08)

## 2016-06-14 MED ORDER — CLONIDINE HCL 0.1 MG PO TABS
0.1000 mg | ORAL_TABLET | Freq: Once | ORAL | Status: AC
  Administered 2016-06-15: 0.1 mg via ORAL
  Filled 2016-06-14: qty 1

## 2016-06-14 NOTE — ED Provider Notes (Signed)
[facility] DEPT Provider Note   CSN: [id number] Arrival date & time: 06/14/16  07/24/2252  By signing my name below, I, Higinio Plan, attest that this documentation has been prepared under the direction and in the presence of Merryl Hacker, MD . Electronically Signed: Higinio Plan, Scribe. 06/14/2016. 1:09 AM.  History   Chief Complaint Chief Complaint  Patient presents with  . Jaw Pain   The history is provided by the patient. No language interpreter was used.   HPI Comments: [name] is a 83 y.o. female with PMHx of CAD, CKD, HTN, DM2, and MI, brought in by EMS to the Emergency Department from [facility] complaining of gradually worsening, jaw pain that began at ~9:00 PM this evening. Pt reports hx of irregular heartbeat that began ~1 year ago in which she visited her cardiologist, Dr. Tamala Julian, last week who decreased her Coreg from 25 mg to 6.25 mg and completely stopped this medication 2 days ago. She states she was sitting down this evening when her pulse suddenly decreased to 34 in which she took one 6.25 mg Coreg which "caused her blood pressure to jump up to 240/110." She notes she called her doctor on call who told her to watch her blood pressure and visit the ED if she experienced any jaw pain. Pt reports she began to experience jaw pain in which she called EMS and was transported to the ED. She states she was given 1 NTG en route by EMS with moderate relief of her pain. No pain at this time.  She notes associated chills that began ~1 month ago and cough that is relieved with over the counter nasal spray. Pt denies any chest pain, shortness of breath, diaphoresis, and fever. She states her last cardiac catheterization and stress test was ~10 years ago.   Past Medical History:  Diagnosis Date  . Bradycardia    a. Coreg decreased due to HR low 40s on event monitor 07-25-2015 -> further decreased due to HR upper 30s in 01/2016.  [name] CAD (coronary artery disease)    a. RV infarct July 24, 2005 c/b  high grade AV block with total occlusion of RCA, unable to treat with PCI. b. Nuc 10/2015: scar but no ischemia.  . Carotid artery disease ([place])   . Chronic anemia   . Chronic lower back pain   . CKD (chronic kidney disease), stage III   . Complication of anesthesia    "almost died when I had my breast reduction"  . Dyslipidemia   . GERD (gastroesophageal reflux disease)   . Heart murmur   . High cholesterol   . History of hiatal hernia   . HTN (hypertension)   . Hyperthyroidism   . Meniere disease    "for years" takes Antivert 3 times per day (01/31/2016)  . Myocardial infarction 07-24-05   "almost died"  . Near syncope   . Osteoporosis   . Renal artery stenosis (HCC)    a. left renal artery stent Jul 25, 2010. b. s/p repeat left renal stenting 01/2016.  . Stroke [facility])    a. Prior infarcts seen on head CT.  [name] Type II diabetes mellitus ([place])    "controlled w/diet and exercise only" (01/31/2016)    Patient Active Problem List   Diagnosis Date Noted  . Carotid artery disease ([place]) 02/27/2016  . Anemia 02/01/2016  . Bradycardia 02/01/2016  . Left renal artery stenosis ([place]) 01/31/2016  . Compression fracture of L1 lumbar vertebra (HCC) 04/23/2015  . T12 compression  fracture ([place]) 03/20/2015  . Hyperthyroidism 03/11/2015  . Thyroid nodule 03/11/2015  . Syncope 03/08/2015  . Hypokalemia 03/08/2015  . Weakness 02/26/2015  . Compression fracture of T12 vertebra ([place]) 02/26/2015  . CKD (chronic kidney disease), stage III 02/26/2015  . Multiple allergies 02/26/2015  . Abdominal pain 02/04/2015  . Hyponatremia 02/04/2015  . Left flank pain 02/03/2015  . LLQ abdominal pain 08/21/2012  . Indigestion 12/21/2011  . DM (diabetes mellitus) ([place]) 12/21/2011  . CAD (coronary artery disease) 12/21/2011  . HTN (hypertension) 12/21/2011  . Hyperlipidemia 12/21/2011  . GERD (gastroesophageal reflux disease) 12/21/2011    Past Surgical History:  Procedure Laterality Date  . ANTERIOR CERVICAL  DECOMP/DISCECTOMY FUSION  02/2002   Archie Endo 08/26/2010  . APPENDECTOMY    . BACK SURGERY    . BLADDER SUSPENSION  x3  . BREAST LUMPECTOMY  11/2007   breast needle-localized lumpectomy/notes 08/12/2012  . CARDIAC CATHETERIZATION  2007   "couldn't get stents in; I almost died'  . CATARACT EXTRACTION W/ INTRAOCULAR LENS  IMPLANT, BILATERAL Bilateral   . CATARACT EXTRACTION W/ INTRAOCULAR LENS  IMPLANT, BILATERAL Bilateral   . CHOLECYSTECTOMY OPEN    . FRACTURE SURGERY    . HIP FRACTURE SURGERY Right 2011   "added rod in my leg and a ball"  . KYPHOPLASTY N/A 03/20/2015   Procedure: THORACIC TWELVE KYPHOPLASTY;  Surgeon: Jovita Gamma, MD;  Location: [place] NEURO ORS;  Service: Neurosurgery;  Laterality: N/A;  . KYPHOPLASTY N/A 04/23/2015   Procedure: KYPHOPLASTY - LUMBAR ONE;  Surgeon: Jovita Gamma, MD;  Location: [place] NEURO ORS;  Service: Neurosurgery;  Laterality: N/A;  L1 Kyphoplasty  . NECK HARDWARE REMOVAL  11/2002   "replaced screw"  . PERIPHERAL VASCULAR CATHETERIZATION Left 12/20/2014   Procedure: Renal Angiography;  Surgeon: Wellington Hampshire, MD;  Location: [place] CV LAB;  Service: Cardiovascular;  Laterality: Left;  . PERIPHERAL VASCULAR CATHETERIZATION N/A 01/31/2016   Procedure: Renal Angiography;  Surgeon: Lorretta Harp, MD;  Location: [place] CV LAB;  Service: Cardiovascular;  Laterality: N/A;  . PERIPHERAL VASCULAR CATHETERIZATION N/A 01/31/2016   Procedure: Abdominal Aortogram;  Surgeon: Lorretta Harp, MD;  Location: [place] CV LAB;  Service: Cardiovascular;  Laterality: N/A;  . PERIPHERAL VASCULAR CATHETERIZATION  01/31/2016   Procedure: Peripheral Vascular Intervention;  Surgeon: Lorretta Harp, MD;  Location: [place] CV LAB;  Service: Cardiovascular;;  . REDUCTION MAMMAPLASTY Bilateral 1985  . renal arteriogram  12/2014   occluded previous Lt renal stent and patent rt renal artery.  [name] RENAL ARTERY STENT Left 2012  . TONSILLECTOMY    . TUBAL LIGATION    .  VAGINAL HYSTERECTOMY  ~ 1995  . WRIST FRACTURE SURGERY Left 2012    OB History    No data available     Home Medications    Prior to Admission medications   Medication Sig Start Date End Date Taking? Authorizing Provider  acetaminophen (TYLENOL) 500 MG tablet Take 500 mg by mouth every 6 (six) hours as needed (for pain.).    Historical Provider, MD  aspirin 81 MG tablet Take 1 tablet (81 mg total) by mouth daily. 02/01/16   Dayna N Dunn, PA-C  azelastine (ASTELIN) 0.1 % nasal spray Place 1-2 sprays into both nostrils daily as needed. For allergies/congestion. 12/14/15   Historical Provider, MD  betamethasone valerate lotion (VALISONE) 0.1 % Apply 1 application topically 2 (two) times daily as needed. For irritated skin. 12/06/15   Historical Provider, MD  calcium carbonate (  CALCIUM 600) 1500 (600 Ca) MG TABS tablet Take 600 mg by mouth 2 (two) times daily.    Historical Provider, MD  carvedilol (COREG) 6.25 MG tablet TAKE 1 TABLET BY MOUTH TWICE A DAY 03/26/16   Belva Crome, MD  cholecalciferol (VITAMIN D) 1000 UNITS tablet Take 2,000 Units by mouth daily.     Historical Provider, MD  cloNIDine (CATAPRES) 0.1 MG tablet Take 0.1 mg by mouth 3 (three) times daily with meals. 10/25/15   Historical Provider, MD  cloNIDine (CATAPRES) 0.2 MG tablet Take 0.2 mg by mouth at bedtime. 11/24/15   Historical Provider, MD  clopidogrel (PLAVIX) 75 MG tablet Take 1 tablet (75 mg total) by mouth daily. 02/01/16   Dayna N Dunn, PA-C  Cod Liver Oil CAPS Take 1 capsule by mouth daily.    Historical Provider, MD  denosumab (PROLIA) 60 MG/ML SOLN injection Inject 60 mg into the skin every 6 (six) months. Administer in upper arm, thigh, or abdomen    Historical Provider, MD  docusate sodium (COLACE) 100 MG capsule Take 100 mg by mouth every evening.     Historical Provider, MD  fexofenadine (ALLEGRA) 180 MG tablet Take 180 mg by mouth daily.    Historical Provider, MD  meclizine (ANTIVERT) 25 MG tablet Take 25 mg  by mouth 3 (three) times daily.     Historical Provider, MD  methimazole (TAPAZOLE) 5 MG tablet Take 5 mg by mouth daily. 08/27/15   Historical Provider, MD  Multiple Vitamins-Minerals (PRESERVISION AREDS) CAPS Take 1 capsule by mouth daily.     Historical Provider, MD  nitroGLYCERIN (NITROSTAT) 0.4 MG SL tablet Place 0.4 mg under the tongue every 5 (five) minutes as needed for chest pain (3 doses MAX).    Historical Provider, MD  olmesartan (BENICAR) 40 MG tablet Take 20 mg by mouth 2 (two) times daily.     Historical Provider, MD  pantoprazole (PROTONIX) 40 MG tablet TAKE 1 TABLET BY MOUTH TWICE A DAY BEFORE A MEAL 03/24/16   Dayna N Dunn, PA-C  PREMARIN vaginal cream Place 1-2 g vaginally every Wednesday and Saturday.  10/06/14   Historical Provider, MD  simvastatin (ZOCOR) 20 MG tablet Take 20 mg by mouth daily.    Historical Provider, MD  vitamin E 400 UNIT capsule Take 400 Units by mouth daily.    Historical Provider, MD    Family History Family History  Problem Relation Age of Onset  . Diabetes type II Mother   . Heart attack Mother   . Heart disease Mother   . Congestive Heart Failure Father   . Pulmonary embolism Father   . Alzheimer's disease Sister   . Heart disease Sister   . Heart disease Brother   . Healthy Sister   . Healthy Sister   . Stroke Brother   . Dementia Brother     VASCULAR  . Stroke Brother   . Diabetes type II Brother   . Other Brother     KILLED IN PLANE CRASH  . Pulmonary disease Sister   . Heart attack Sister   . Kidney failure Sister   . Diabetes type II Sister   . Diabetes type II Sister   . Kidney failure Sister     Social History Social History  Substance Use Topics  . Smoking status: Former Smoker    Packs/day: 1.00    Years: 30.00    Types: Cigarettes  . Smokeless tobacco: Never Used     Comment: "quit smoking  in 1989"  . Alcohol use No   Allergies   Fentanyl; Fish allergy; Morphine and related; Singulair [montelukast]; Actonel  [risedronate sodium]; Dilaudid [hydromorphone hcl]; Fosamax [alendronate sodium]; Hydralazine; Lipitor [atorvastatin]; Lisinopril; Micardis [telmisartan]; Septra [sulfamethoxazole-trimethoprim]; Tekturna [aliskiren]; Tricor [fenofibrate]; Amlodipine; Avapro [irbesartan]; Cardio complete [nutritional supplements]; Cardizem [diltiazem hcl]; Ciprofloxacin; Codeine; Cyclobenzaprine; Forteo [teriparatide (recombinant)]; Inapsine [droperidol]; Ivp dye [iodinated diagnostic agents]; Shellfish allergy; and Tussionex pennkinetic er [hydrocod polst-cpm polst er]   Review of Systems Review of Systems  Constitutional: Positive for chills. Negative for diaphoresis and fever.  HENT:       +jaw pain   Respiratory: Positive for cough. Negative for shortness of breath.   Cardiovascular: Negative for chest pain.       Jaw pain  Gastrointestinal: Negative for abdominal pain and nausea.  All other systems reviewed and are negative.  Physical Exam Updated Vital Signs BP (!) 217/78 (BP Location: Right Arm)   Pulse 64   Temp 98 F (36.7 C) (Oral)   Resp 17   Ht 5\' 1"  (1.549 m)   Wt 115 lb (52.2 kg)   SpO2 97%   BMI 21.73 kg/m   Physical Exam  Constitutional: She is oriented to person, place, and time.  Elderly, no acute distress  HENT:  Head: Normocephalic and atraumatic.  Cardiovascular: Normal rate, regular rhythm and normal heart sounds.   No murmur heard. Pulmonary/Chest: Effort normal and breath sounds normal. No respiratory distress. She has no wheezes.  Abdominal: Soft. Bowel sounds are normal. There is no tenderness. There is no guarding.  Multiple well-healed scars over the abdomen  Musculoskeletal: She exhibits no edema.  Neurological: She is alert and oriented to person, place, and time.  Skin: Skin is warm and dry.  Psychiatric: She has a normal mood and affect.  Nursing note and vitals reviewed.   ED Treatments / Results  DIAGNOSTIC STUDIES:  Oxygen Saturation is 97% on RA,  normal by my interpretation.    COORDINATION OF CARE:  11:29 PM Discussed treatment plan with pt at bedside and pt agreed to plan.  Labs (all labs ordered are listed, but only abnormal results are displayed) Labs Reviewed  CBC WITH DIFFERENTIAL/PLATELET - Abnormal; Notable for the following:       Result Value   RBC 3.76 (*)    Hemoglobin 10.4 (*)    HCT 30.6 (*)    All other components within normal limits  BASIC METABOLIC PANEL - Abnormal; Notable for the following:    Sodium 126 (*)    Chloride 94 (*)    Glucose, Bld 134 (*)    Creatinine, Ser 1.22 (*)    GFR calc non Af Amer 40 (*)    GFR calc Af Amer 46 (*)    All other components within normal limits  I-STAT TROPOININ, ED    EKG  EKG Interpretation  Date/Time:  Saturday June 14 2016 23:35:14 EST Ventricular Rate:  60 PR Interval:    QRS Duration: 100 QT Interval:  469 QTC Calculation: 469 R Axis:   38 Text Interpretation:  Sinus rhythm Probable LVH with secondary repol abnrm Abnormal inferior Q waves No significant change since last tracing Confirmed by Katelyn Kohlmeyer  MD, Osa Fogarty ([id number]) on 06/14/2016 11:38:43 PM      Radiology Dg Chest 2 View  Result Date: 06/15/2016 CLINICAL DATA:  Acute onset of jaw pain and generalized chest pain. Initial encounter. EXAM: CHEST  2 VIEW COMPARISON:  Chest radiograph performed 09/04/2015 FINDINGS: The lungs are well-aerated and  clear. There is no evidence of focal opacification, pleural effusion or pneumothorax. The heart is normal in size; the mediastinal contour is within normal limits. No acute osseous abnormalities are seen. Cervical spinal fusion hardware is noted. Chronic compression deformities are noted of the lower thoracic spine, with changes of vertebroplasty. Diffuse calcification is seen along the abdominal aorta. IMPRESSION: 1. No acute cardiopulmonary process seen. 2. Chronic compression deformities along the lower thoracic spine, with changes of vertebroplasty. 3. Diffuse  aortic atherosclerosis. Electronically Signed   By: Garald Balding M.D.   On: 06/15/2016 00:15    Procedures Procedures (including critical care time)  Medications Ordered in ED Medications  cloNIDine (CATAPRES) tablet 0.1 mg (0.1 mg Oral Given 06/15/16 0018)    Initial Impression / Assessment and Plan / ED Course  I have reviewed the triage vital signs and the nursing notes.  Pertinent labs & imaging results that were available during my care of the patient were reviewed by me and considered in my medical decision making (see chart for details).    Patient presents with jaw pain, hypertension, episode of bradycardia at home. She is currently asymptomatic. Followed closely by cardiology with a recent decrease in her Coreg. Initial blood pressure [id number] systolic. She is on clonidine several times per day but has not missed a dose and took a dose at bedtime. She has hypersensitivity to hydralazine. She was dose 0.1 mg additional clonidine for her hypertension. Her jaw pain is suspicious given improvement with nitroglycerin. EKG is nonischemic and initial troponin is negative. Lab work notable for mild hyponatremia with a sodium of 126. Chest x-ray negative. She is remained asymptomatic in the ED. Given episode of bradycardia, and jaw pain in the setting of severe hypertension, will admit for formal chest pain rule out and blood pressure management. Patient's repeat blood pressure 168/64 following clonidine. I discussed with cardiology fellow, Dr. Eula Fried, who has deferred admission to the hospitalist. He will have the daytime cardiology team evaluate the patient with recommendations.  I personally performed the services described in this documentation, which was scribed in my presence. The recorded information has been reviewed and is accurate.   Final Clinical Impressions(s) / ED Diagnoses   Final diagnoses:  Hypertensive urgency    New Prescriptions New Prescriptions   No medications on file      Merryl Hacker, MD 06/15/16 0111

## 2016-06-14 NOTE — Telephone Encounter (Signed)
    Ms. Frometa had another episode of bradycardia with HR in mid 53s. She stopped coreg completely last week. She didn't feel well so she checked her pulse. BP then went up to 227/98 with HR of 76. Currently BP 143/68, HR 51 and feeling fine. She gets really nervous and feels badly when heart dips into 30s. I have made her an appt with Dr. Curt Bears this Wednesday at 11:15 to discuss options.   Angelena Form PA-C  [facility]

## 2016-06-14 NOTE — ED Triage Notes (Addendum)
Pt lives [facility] with her husband. Pt reports around 9pm tonight her BP was high above the 200s. Pt was having jaw pain/pressure 5/10. Pt gave herself 1 ASA and 1 nitro which subsided her pain. Once EMS arrived, EMS gave 3 more ASA for a total of 324mg  and 2 more sublingual nitro at 2226 which helped relieve her jaw pain again. Pt's EMS BP was 232/92 then later 169/88. Pt denies N&V, SOB, diaphoresis, and denies Cp. Hx of MI in 2007.

## 2016-06-15 ENCOUNTER — Encounter (HOSPITAL_COMMUNITY): Payer: Self-pay | Admitting: Family Medicine

## 2016-06-15 ENCOUNTER — Emergency Department (HOSPITAL_COMMUNITY): Payer: Medicare Other

## 2016-06-15 DIAGNOSIS — I160 Hypertensive urgency: Secondary | ICD-10-CM | POA: Diagnosis present

## 2016-06-15 DIAGNOSIS — I208 Other forms of angina pectoris: Secondary | ICD-10-CM | POA: Diagnosis not present

## 2016-06-15 DIAGNOSIS — I10 Essential (primary) hypertension: Secondary | ICD-10-CM | POA: Diagnosis not present

## 2016-06-15 DIAGNOSIS — E871 Hypo-osmolality and hyponatremia: Secondary | ICD-10-CM

## 2016-06-15 DIAGNOSIS — N183 Chronic kidney disease, stage 3 (moderate): Secondary | ICD-10-CM

## 2016-06-15 DIAGNOSIS — I2510 Atherosclerotic heart disease of native coronary artery without angina pectoris: Secondary | ICD-10-CM | POA: Diagnosis not present

## 2016-06-15 DIAGNOSIS — I701 Atherosclerosis of renal artery: Secondary | ICD-10-CM | POA: Diagnosis not present

## 2016-06-15 DIAGNOSIS — R001 Bradycardia, unspecified: Secondary | ICD-10-CM | POA: Diagnosis not present

## 2016-06-15 DIAGNOSIS — E0590 Thyrotoxicosis, unspecified without thyrotoxic crisis or storm: Secondary | ICD-10-CM

## 2016-06-15 LAB — BASIC METABOLIC PANEL
ANION GAP: 8 (ref 5–15)
Anion gap: 8 (ref 5–15)
BUN: 15 mg/dL (ref 6–20)
BUN: 19 mg/dL (ref 6–20)
CHLORIDE: 94 mmol/L — AB (ref 101–111)
CHLORIDE: 98 mmol/L — AB (ref 101–111)
CO2: 22 mmol/L (ref 22–32)
CO2: 24 mmol/L (ref 22–32)
CREATININE: 1.03 mg/dL — AB (ref 0.44–1.00)
CREATININE: 1.22 mg/dL — AB (ref 0.44–1.00)
Calcium: 9 mg/dL (ref 8.9–10.3)
Calcium: 9.1 mg/dL (ref 8.9–10.3)
GFR calc Af Amer: 46 mL/min — ABNORMAL LOW (ref >60)
GFR calc non Af Amer: 40 mL/min — ABNORMAL LOW (ref >60)
GFR calc non Af Amer: 49 mL/min — ABNORMAL LOW (ref >60)
GFR, EST AFRICAN AMERICAN: 57 mL/min — AB (ref >60)
GLUCOSE: 134 mg/dL — AB (ref 65–99)
Glucose, Bld: 137 mg/dL — ABNORMAL HIGH (ref 65–99)
POTASSIUM: 4.8 mmol/L (ref 3.5–5.1)
Potassium: 4 mmol/L (ref 3.5–5.1)
Sodium: 126 mmol/L — ABNORMAL LOW (ref 135–145)
Sodium: 128 mmol/L — ABNORMAL LOW (ref 135–145)

## 2016-06-15 LAB — I-STAT CHEM 8, ED
BUN: 23 mg/dL — ABNORMAL HIGH (ref 6–20)
Calcium, Ion: 1.15 mmol/L (ref 1.15–1.40)
Chloride: 93 mmol/L — ABNORMAL LOW (ref 101–111)
Creatinine, Ser: 1.2 mg/dL — ABNORMAL HIGH (ref 0.44–1.00)
Glucose, Bld: 127 mg/dL — ABNORMAL HIGH (ref 65–99)
HEMATOCRIT: 30 % — AB (ref 36.0–46.0)
HEMOGLOBIN: 10.2 g/dL — AB (ref 12.0–15.0)
POTASSIUM: 4.9 mmol/L (ref 3.5–5.1)
Sodium: 127 mmol/L — ABNORMAL LOW (ref 135–145)
TCO2: 26 mmol/L (ref 0–100)

## 2016-06-15 LAB — TSH: TSH: 3.513 u[IU]/mL (ref 0.350–4.500)

## 2016-06-15 LAB — TROPONIN I
Troponin I: <0.03 ng/mL (ref <0.03)
Troponin I: <0.03 ng/mL (ref <0.03)

## 2016-06-15 LAB — CORTISOL: Cortisol, Plasma: 17.9 ug/dL

## 2016-06-15 LAB — MRSA PCR SCREENING: MRSA by PCR: NEGATIVE

## 2016-06-15 MED ORDER — HYDRALAZINE HCL 20 MG/ML IJ SOLN: 5.0000 mg | Freq: Four times a day (QID) | INTRAMUSCULAR | Status: DC | PRN

## 2016-06-15 MED ORDER — HEPARIN SODIUM (PORCINE) 5000 UNIT/ML IJ SOLN
5000.0000 [IU] | Freq: Three times a day (TID) | INTRAMUSCULAR | Status: DC
  Administered 2016-06-15 – 2016-06-16 (×3): 5000 [IU] via SUBCUTANEOUS
  Filled 2016-06-15 (×4): qty 1

## 2016-06-15 MED ORDER — CLOPIDOGREL BISULFATE 75 MG PO TABS
75.0000 mg | ORAL_TABLET | Freq: Every day | ORAL | Status: DC
  Administered 2016-06-15 – 2016-06-16 (×2): 75 mg via ORAL
  Filled 2016-06-15 (×2): qty 1

## 2016-06-15 MED ORDER — DOCUSATE SODIUM 100 MG PO CAPS
100.0000 mg | ORAL_CAPSULE | Freq: Every day | ORAL | Status: DC
  Administered 2016-06-15 – 2016-06-16 (×2): 100 mg via ORAL
  Filled 2016-06-15 (×2): qty 1

## 2016-06-15 MED ORDER — CALCIUM CARBONATE 1250 (500 CA) MG PO TABS
1250.0000 mg | ORAL_TABLET | Freq: Two times a day (BID) | ORAL | Status: DC
  Administered 2016-06-15 – 2016-06-16 (×3): 1250 mg via ORAL
  Filled 2016-06-15 (×4): qty 1

## 2016-06-15 MED ORDER — ALPRAZOLAM 0.25 MG PO TABS
0.2500 mg | ORAL_TABLET | Freq: Two times a day (BID) | ORAL | Status: DC | PRN
  Administered 2016-06-15 (×2): 0.25 mg via ORAL
  Filled 2016-06-15 (×2): qty 1

## 2016-06-15 MED ORDER — METHIMAZOLE 5 MG PO TABS
5.0000 mg | ORAL_TABLET | Freq: Every day | ORAL | Status: DC
  Administered 2016-06-15 – 2016-06-16 (×2): 5 mg via ORAL
  Filled 2016-06-15 (×3): qty 1

## 2016-06-15 MED ORDER — SODIUM CHLORIDE 0.9% FLUSH: 3.0000 mL | INTRAVENOUS | Status: DC | PRN

## 2016-06-15 MED ORDER — ASPIRIN EC 81 MG PO TBEC
81.0000 mg | DELAYED_RELEASE_TABLET | Freq: Every day | ORAL | Status: DC
  Administered 2016-06-16: 81 mg via ORAL
  Filled 2016-06-15: qty 1

## 2016-06-15 MED ORDER — NITROGLYCERIN 2 % TD OINT
0.5000 [in_us] | TOPICAL_OINTMENT | Freq: Three times a day (TID) | TRANSDERMAL | Status: DC
  Administered 2016-06-15 – 2016-06-16 (×3): 0.5 [in_us] via TOPICAL
  Filled 2016-06-15: qty 30
  Filled 2016-06-15: qty 1

## 2016-06-15 MED ORDER — VITAMIN D 1000 UNITS PO TABS
2000.0000 [IU] | ORAL_TABLET | Freq: Every day | ORAL | Status: DC
  Administered 2016-06-15 – 2016-06-16 (×2): 2000 [IU] via ORAL
  Filled 2016-06-15 (×2): qty 2

## 2016-06-15 MED ORDER — SODIUM CHLORIDE 0.9% FLUSH
3.0000 mL | Freq: Two times a day (BID) | INTRAVENOUS | Status: DC
  Administered 2016-06-15 – 2016-06-16 (×3): 3 mL via INTRAVENOUS

## 2016-06-15 MED ORDER — ONDANSETRON HCL 4 MG/2ML IJ SOLN: 4.0000 mg | Freq: Four times a day (QID) | INTRAMUSCULAR | Status: DC | PRN

## 2016-06-15 MED ORDER — LORATADINE 10 MG PO TABS
10.0000 mg | ORAL_TABLET | Freq: Every day | ORAL | Status: DC
  Administered 2016-06-15 – 2016-06-16 (×2): 10 mg via ORAL
  Filled 2016-06-15 (×2): qty 1

## 2016-06-15 MED ORDER — CARVEDILOL 6.25 MG PO TABS
6.2500 mg | ORAL_TABLET | Freq: Two times a day (BID) | ORAL | Status: DC
  Administered 2016-06-15: 6.25 mg via ORAL
  Filled 2016-06-15: qty 1

## 2016-06-15 MED ORDER — MECLIZINE HCL 25 MG PO TABS
25.0000 mg | ORAL_TABLET | Freq: Two times a day (BID) | ORAL | Status: DC | PRN
  Administered 2016-06-15 – 2016-06-16 (×3): 25 mg via ORAL
  Filled 2016-06-15 (×6): qty 1

## 2016-06-15 MED ORDER — SODIUM CHLORIDE 0.9 % IV SOLN: 250.0000 mL | INTRAVENOUS | Status: DC | PRN

## 2016-06-15 MED ORDER — CLONIDINE HCL 0.1 MG PO TABS: 0.1000 mg | ORAL_TABLET | Freq: Four times a day (QID) | ORAL | Status: DC | PRN

## 2016-06-15 MED ORDER — ACETAMINOPHEN 325 MG PO TABS: 650.0000 mg | ORAL_TABLET | ORAL | Status: DC | PRN

## 2016-06-15 MED ORDER — PANTOPRAZOLE SODIUM 40 MG PO TBEC
40.0000 mg | DELAYED_RELEASE_TABLET | Freq: Two times a day (BID) | ORAL | Status: DC
  Administered 2016-06-15 – 2016-06-16 (×3): 40 mg via ORAL
  Filled 2016-06-15 (×4): qty 1

## 2016-06-15 MED ORDER — CLONIDINE HCL 0.1 MG PO TABS
0.1000 mg | ORAL_TABLET | Freq: Three times a day (TID) | ORAL | Status: DC
  Administered 2016-06-15 – 2016-06-16 (×4): 0.1 mg via ORAL
  Filled 2016-06-15 (×5): qty 1

## 2016-06-15 MED ORDER — SIMVASTATIN 20 MG PO TABS
20.0000 mg | ORAL_TABLET | ORAL | Status: DC
  Administered 2016-06-16: 20 mg via ORAL
  Filled 2016-06-15: qty 1

## 2016-06-15 MED ORDER — DOCUSATE SODIUM 100 MG PO CAPS
200.0000 mg | ORAL_CAPSULE | Freq: Every day | ORAL | Status: DC
  Administered 2016-06-15: 200 mg via ORAL
  Filled 2016-06-15: qty 2

## 2016-06-15 MED ORDER — ACETAMINOPHEN 500 MG PO TABS
1000.0000 mg | ORAL_TABLET | Freq: Two times a day (BID) | ORAL | Status: DC
  Administered 2016-06-15 – 2016-06-16 (×3): 1000 mg via ORAL
  Filled 2016-06-15 (×3): qty 2

## 2016-06-15 NOTE — Consult Note (Signed)
CARDIOLOGY INPATIENT CONSULTATION NOTE  Patient ID: [name] MRN: [id number], DOB/AGE: May 29, 1931   Admit date: 06/14/2016   Primary Physician: Irven Shelling, MD Primary Cardiologist: Daneen Schick MD  Reason for Consult:   Chest pain   Requesting Physician: Dr. Dina Rich ED  HPI: This is a 83 y.o. female with known history of CAD and MI in 2005/06/22 with known CTO of RCA, hypertension, diet-controlled diabetes mellitus, renal artery stenosis status post left renal artery stent, chronic kidney disease stage III, chronic anemia, and hyperthyroidism presented with elevated blood pressure and jaw pain. She complained of jaw pain with the elevated BP. She also has CP, SOB, diaphoresis,  Nausea, vomiting. She had palpitations and chills.  Patient also had bradycardia and was taken off coreg by Dr. Tamala Julian because of that. She has an upcoming appointment with Dr. Baird Kay for evaluation of pacemaker. Yesterday she took her coreg since her BP was elevated. Her pulse was in 69s yesterday. She called on call provider who recommended her to come to the ED via EMS. She received 324 mg aspirin and 3 x NTG en route with improvement and resolution of chest pain.  Initial SBP was 217. Was given clonidine by the ED physician.   Problem List: Past Medical History:  Diagnosis Date  . Bradycardia    a. Coreg decreased due to HR low 40s on event monitor 2015/06/23 -> further decreased due to HR upper 30s in 01/2016.  [name] CAD (coronary artery disease)    a. RV infarct 22-Jun-2005 c/b high grade AV block with total occlusion of RCA, unable to treat with PCI. b. Nuc 10/2015: scar but no ischemia.  . Carotid artery disease ([place])   . Chronic anemia   . Chronic lower back pain   . CKD (chronic kidney disease), stage III   . Complication of anesthesia    "almost died when I had my breast reduction"  . Dyslipidemia   . GERD (gastroesophageal reflux disease)   . Heart murmur   . High cholesterol   . History of hiatal  hernia   . HTN (hypertension)   . Hyperthyroidism   . Meniere disease    "for years" takes Antivert 3 times per day (01/31/2016)  . Myocardial infarction 06/22/2005   "almost died"  . Near syncope   . Osteoporosis   . Renal artery stenosis (HCC)    a. left renal artery stent June 23, 2010. b. s/p repeat left renal stenting 01/2016.  . Stroke [facility])    a. Prior infarcts seen on head CT.  [name] Type II diabetes mellitus ([place])    "controlled w/diet and exercise only" (01/31/2016)    Past Surgical History:  Procedure Laterality Date  . ANTERIOR CERVICAL DECOMP/DISCECTOMY FUSION  02/2002   Archie Endo 08/26/2010  . APPENDECTOMY    . BACK SURGERY    . BLADDER SUSPENSION  x3  . BREAST LUMPECTOMY  11/2007   breast needle-localized lumpectomy/notes 08/12/2012  . CARDIAC CATHETERIZATION  2005-06-22   "couldn't get stents in; I almost died'  . CATARACT EXTRACTION W/ INTRAOCULAR LENS  IMPLANT, BILATERAL Bilateral   . CATARACT EXTRACTION W/ INTRAOCULAR LENS  IMPLANT, BILATERAL Bilateral   . CHOLECYSTECTOMY OPEN    . FRACTURE SURGERY    . HIP FRACTURE SURGERY Right 2009/06/22   "added rod in my leg and a ball"  . KYPHOPLASTY N/A 03/20/2015   Procedure: THORACIC TWELVE KYPHOPLASTY;  Surgeon: Jovita Gamma, MD;  Location: [place] NEURO ORS;  Service: Neurosurgery;  Laterality: N/A;  .  KYPHOPLASTY N/A 04/23/2015   Procedure: KYPHOPLASTY - LUMBAR ONE;  Surgeon: Jovita Gamma, MD;  Location: [place] NEURO ORS;  Service: Neurosurgery;  Laterality: N/A;  L1 Kyphoplasty  . NECK HARDWARE REMOVAL  11/2002   "replaced screw"  . PERIPHERAL VASCULAR CATHETERIZATION Left 12/20/2014   Procedure: Renal Angiography;  Surgeon: Wellington Hampshire, MD;  Location: [place] CV LAB;  Service: Cardiovascular;  Laterality: Left;  . PERIPHERAL VASCULAR CATHETERIZATION N/A 01/31/2016   Procedure: Renal Angiography;  Surgeon: Lorretta Harp, MD;  Location: [place] CV LAB;  Service: Cardiovascular;  Laterality: N/A;  . PERIPHERAL VASCULAR CATHETERIZATION N/A  01/31/2016   Procedure: Abdominal Aortogram;  Surgeon: Lorretta Harp, MD;  Location: [place] CV LAB;  Service: Cardiovascular;  Laterality: N/A;  . PERIPHERAL VASCULAR CATHETERIZATION  01/31/2016   Procedure: Peripheral Vascular Intervention;  Surgeon: Lorretta Harp, MD;  Location: [facility] CV LAB;  Service: Cardiovascular;;  . REDUCTION MAMMAPLASTY Bilateral 1985  . renal arteriogram  12/2014   occluded previous Lt renal stent and patent rt renal artery.  [name] RENAL ARTERY STENT Left 2012  . TONSILLECTOMY    . TUBAL LIGATION    . VAGINAL HYSTERECTOMY  ~ 1995  . WRIST FRACTURE SURGERY Left 2012     Allergies:  Allergies  Allergen Reactions  . Fentanyl Anaphylaxis    Cardiac arrest and coded  . Fish Allergy Anaphylaxis and Rash  . Morphine And Related Anaphylaxis    Cardiac arrest and coded Per Dr. Conley Canal patient has taken percocet without issue  . Singulair [Montelukast] Palpitations and Other (See Comments)    Increased BP and HR  . Actonel [Risedronate Sodium] Rash and Other (See Comments)    weakness  . Dilaudid [Hydromorphone Hcl] Rash and Other (See Comments)    Crying and screaming  . Fosamax [Alendronate Sodium] Rash and Other (See Comments)    Weakness and myalgias  . Hydralazine Diarrhea and Other (See Comments)    Swelling in stomach  . Lipitor [Atorvastatin] Swelling and Rash    Tongue swelling   . Lisinopril Diarrhea, Rash and Cough  . Micardis [Telmisartan] Diarrhea and Rash  . Septra [Sulfamethoxazole-Trimethoprim] Nausea And Vomiting and Rash  . Tekturna [Aliskiren] Swelling and Rash    Swelling of tongue  . Tricor [Fenofibrate] Rash and Other (See Comments)    Flu symptoms  . Amlodipine Diarrhea  . Avapro [Irbesartan] Rash  . Cardio Complete [Nutritional Supplements] Rash  . Cardizem [Diltiazem Hcl] Rash  . Ciprofloxacin Rash and Other (See Comments)    Black spot on body  . Codeine Rash  . Cyclobenzaprine Nausea Only  . Forteo  [Teriparatide (Recombinant)] Rash  . Inapsine [Droperidol] Rash  . Ivp Dye [Iodinated Diagnostic Agents] Rash  . Shellfish Allergy Rash  . Tussionex Pennkinetic Er [Hydrocod Polst-Cpm Polst Er] Itching and Rash     Home Medications Current Facility-Administered Medications  Medication Dose Route Frequency Provider Last Rate Last Dose  . 0.9 %  sodium chloride infusion  250 mL Intravenous PRN Vianne Bulls, MD      . acetaminophen (TYLENOL) tablet 1,000 mg  1,000 mg Oral BID Vianne Bulls, MD      . acetaminophen (TYLENOL) tablet 650 mg  650 mg Oral Q4H PRN Vianne Bulls, MD      . ALPRAZolam Duanne Moron) tablet 0.25 mg  0.25 mg Oral BID PRN Vianne Bulls, MD      . Derrill Memo ON 06/16/2016] aspirin EC tablet 81 mg  81 mg Oral Daily Ilene Qua Opyd, MD      . calcium carbonate (OS-CAL - dosed in mg of elemental calcium) tablet 1,250 mg  1,250 mg Oral BID WC Vianne Bulls, MD      . cholecalciferol (VITAMIN D) tablet 2,000 Units  2,000 Units Oral Daily Ilene Qua Opyd, MD      . cloNIDine (CATAPRES) tablet 0.1 mg  0.1 mg Oral TID WC Ilene Qua Opyd, MD      . cloNIDine (CATAPRES) tablet 0.1 mg  0.1 mg Oral Q6H PRN Lily Kocher, MD      . clopidogrel (PLAVIX) tablet 75 mg  75 mg Oral Daily Ilene Qua Opyd, MD      . docusate sodium (COLACE) capsule 100 mg  100 mg Oral Daily Ilene Qua Opyd, MD      . docusate sodium (COLACE) capsule 200 mg  200 mg Oral QHS Velvet Bathe, MD      . heparin injection 5,000 Units  5,000 Units Subcutaneous Q8H Ilene Qua Opyd, MD      . loratadine (CLARITIN) tablet 10 mg  10 mg Oral Daily Vianne Bulls, MD      . meclizine (ANTIVERT) tablet 25 mg  25 mg Oral BID PRN Vianne Bulls, MD      . methimazole (TAPAZOLE) tablet 5 mg  5 mg Oral Daily Ilene Qua Opyd, MD      . nitroGLYCERIN (NITROGLYN) 2 % ointment 0.5 inch  0.5 inch Topical Q8H Vianne Bulls, MD   0.5 inch at 06/15/16 0235  . ondansetron (ZOFRAN) injection 4 mg  4 mg Intravenous Q6H PRN Vianne Bulls, MD      .  pantoprazole (PROTONIX) EC tablet 40 mg  40 mg Oral BID AC Vianne Bulls, MD      . Derrill Memo ON 06/16/2016] simvastatin (ZOCOR) tablet 20 mg  20 mg Oral Once per day on Mon Wed Fri Timothy S Opyd, MD      . sodium chloride flush (NS) 0.9 % injection 3 mL  3 mL Intravenous Q12H Vianne Bulls, MD   3 mL at 06/15/16 0232  . sodium chloride flush (NS) 0.9 % injection 3 mL  3 mL Intravenous PRN Vianne Bulls, MD         Family History  Problem Relation Age of Onset  . Diabetes type II Mother   . Heart attack Mother   . Heart disease Mother   . Congestive Heart Failure Father   . Pulmonary embolism Father   . Alzheimer's disease Sister   . Heart disease Sister   . Heart disease Brother   . Healthy Sister   . Healthy Sister   . Stroke Brother   . Dementia Brother     VASCULAR  . Stroke Brother   . Diabetes type II Brother   . Other Brother     KILLED IN PLANE CRASH  . Pulmonary disease Sister   . Heart attack Sister   . Kidney failure Sister   . Diabetes type II Sister   . Diabetes type II Sister   . Kidney failure Sister      Social History   Social History  . Marital status: Married    Spouse name: N/A  . Number of children: N/A  . Years of education: N/A   Occupational History  . Not on file.   Social History Main Topics  . Smoking status: Former Smoker    Packs/day: 1.00  Years: 30.00    Types: Cigarettes  . Smokeless tobacco: Never Used     Comment: "quit smoking in 1989"  . Alcohol use No  . Drug use: No  . Sexual activity: Not on file   Other Topics Concern  . Not on file   Social History Narrative  . No narrative on file     Review of Systems: General: fatigue and chills negative for fever, night sweats  Cardiovascular:chest pain, jaw pain, elevated BP  Dermatological: negative for rash Respiratory: cough non productive negative for wheezing  Urologic: negative for hematuria Abdominal: nausea negative for vomiting, diarrhea, bright red blood per  rectum, melena, or hematemesis Neurologic: negative for visual changes, syncope, or dizziness Hematology: denied anemia  Psychiatry: non suicidal/homicidal  Musculoskeletal: denied back pain  Physical Exam: Vitals: BP (!) 192/79   Pulse 72   Temp 98.7 F (37.1 C) (Oral)   Resp (!) 26   Ht '5\' 1"'$  (1.549 m)   Wt 52.2 kg (115 lb)   SpO2 96%   BMI 21.73 kg/m  General: not in acute distress Neck: JVP flat, neck supple Heart: bradycardiac, regular rhythm, S1, S2, no murmurs  Lungs: CTAB  GI: non tender, non distended, bowel sounds present Extremities: no edema Neuro: AAO x 3  Psych: normal affect, no anxiety   Labs:   Results for orders placed or performed during the hospital encounter of 06/14/16 (from the past 24 hour(s))  CBC with Differential     Status: Abnormal   Collection Time: 06/14/16 11:37 PM  Result Value Ref Range   WBC 7.5 4.0 - 10.5 K/uL   RBC 3.76 (L) 3.87 - 5.11 MIL/uL   Hemoglobin 10.4 (L) 12.0 - 15.0 g/dL   HCT 30.6 (L) 36.0 - 46.0 %   MCV 81.4 78.0 - 100.0 fL   MCH 27.7 26.0 - 34.0 pg   MCHC 34.0 30.0 - 36.0 g/dL   RDW 14.6 11.5 - 15.5 %   Platelets 218 150 - 400 K/uL   Neutrophils Relative % 65 %   Neutro Abs 4.9 1.7 - 7.7 K/uL   Lymphocytes Relative 25 %   Lymphs Abs 1.9 0.7 - 4.0 K/uL   Monocytes Relative 8 %   Monocytes Absolute 0.6 0.1 - 1.0 K/uL   Eosinophils Relative 2 %   Eosinophils Absolute 0.1 0.0 - 0.7 K/uL   Basophils Relative 0 %   Basophils Absolute 0.0 0.0 - 0.1 K/uL  Basic metabolic panel     Status: Abnormal   Collection Time: 06/14/16 11:37 PM  Result Value Ref Range   Sodium 126 (L) 135 - 145 mmol/L   Potassium 4.8 3.5 - 5.1 mmol/L   Chloride 94 (L) 101 - 111 mmol/L   CO2 24 22 - 32 mmol/L   Glucose, Bld 134 (H) 65 - 99 mg/dL   BUN 19 6 - 20 mg/dL   Creatinine, Ser 1.22 (H) 0.44 - 1.00 mg/dL   Calcium 9.0 8.9 - 10.3 mg/dL   GFR calc non Af Amer 40 (L) >60 mL/min   GFR calc Af Amer 46 (L) >60 mL/min   Anion gap 8 5 - 15    I-Stat Troponin, ED (not at [facility])     Status: None   Collection Time: 06/14/16 11:49 PM  Result Value Ref Range   Troponin i, poc 0.01 0.00 - 0.08 ng/mL   Comment 3          I-Stat Chem 8, ED  (not at [facility])     Status: Abnormal   Collection Time: 06/15/16  1:30 AM  Result Value Ref Range   Sodium 127 (L) 135 - 145 mmol/L   Potassium 4.9 3.5 - 5.1 mmol/L   Chloride 93 (L) 101 - 111 mmol/L   BUN 23 (H) 6 - 20 mg/dL   Creatinine, Ser 1.20 (H) 0.44 - 1.00 mg/dL   Glucose, Bld 127 (H) 65 - 99 mg/dL   Calcium, Ion 1.15 1.15 - 1.40 mmol/L   TCO2 26 0 - 100 mmol/L   Hemoglobin 10.2 (L) 12.0 - 15.0 g/dL   HCT 30.0 (L) 36.0 - 46.0 %  TSH     Status: None   Collection Time: 06/15/16  2:28 AM  Result Value Ref Range   TSH 3.513 0.350 - 4.500 uIU/mL  Troponin I     Status: None   Collection Time: 06/15/16  2:28 AM  Result Value Ref Range   Troponin I <0.03 <0.03 ng/mL     Radiology/Studies: Dg Chest 2 View  Result Date: 06/15/2016 CLINICAL DATA:  Acute onset of jaw pain and generalized chest pain. Initial encounter. EXAM: CHEST  2 VIEW COMPARISON:  Chest radiograph performed 09/04/2015 FINDINGS: The lungs are well-aerated and clear. There is no evidence of focal opacification, pleural effusion or pneumothorax. The heart is normal in size; the mediastinal contour is within normal limits. No acute osseous abnormalities are seen. Cervical spinal fusion hardware is noted. Chronic compression deformities are noted of the lower thoracic spine, with changes of vertebroplasty. Diffuse calcification is seen along the abdominal aorta. IMPRESSION: 1. No acute cardiopulmonary process seen. 2. Chronic compression deformities along the lower thoracic spine, with changes of vertebroplasty. 3. Diffuse aortic atherosclerosis. Electronically Signed   By: Garald Balding M.D.   On: 06/15/2016 00:15   Mm Clip Placement Left  Result Date: 05/22/2016 CLINICAL DATA:  Status post ultrasound-guided biopsy of a left  breast mass. EXAM: DIAGNOSTIC LEFT MAMMOGRAM POST ULTRASOUND BIOPSY COMPARISON:  Previous exam(s). FINDINGS: Mammographic images were obtained following ultrasound guided biopsy of the left breast mass at the 11:30 o'clock axis. At the conclusion of the procedure, a heart shaped tissue marker was placed at the biopsy site. Biopsy clip is well-positioned within the targeted mass. IMPRESSION: Biopsy clip well-positioned within the targeted mass in the left breast at the 11:30 o'clock axis. Final Assessment: Post Procedure Mammograms for Marker Placement Electronically Signed   By: Franki Cabot M.D.   On: 05/22/2016 14:48   [place] Lt Breast Bx W Loc Dev 1st Lesion Img Bx Spec US Guide  Addendum Date: 05/26/2016   ADDENDUM REPORT: 05/26/2016 11:01 ADDENDUM: Pathology revealed FAT NECROSIS of the Left breast, 11:30 o'clock. This was found to be discordant by Dr. Franki Cabot, with excision recommended. Pathology results were discussed with the patient by telephone. The patient reported doing well after the biopsy with tenderness at the site. Post biopsy instructions and care were reviewed and questions were answered. The patient was encouraged to call [facility] for any additional concerns. Surgical consultation has been arranged with Dr. Erroll Luna at [facility] on June 09, 2016. Pathology results reported by Terie Purser, RN on 05/26/2016. Electronically Signed   By: Franki Cabot M.D.   On: 05/26/2016 11:01   Result Date: 05/26/2016 CLINICAL DATA:  Patient with a left breast mass presents today for ultrasound-guided biopsy. EXAM: ULTRASOUND GUIDED LEFT BREAST CORE NEEDLE BIOPSY COMPARISON:  Previous exam(s). PROCEDURE: I met with  the patient and we discussed the procedure of ultrasound-guided biopsy, including benefits and alternatives. We discussed the high likelihood of a successful procedure. We discussed the risks of the procedure including infection, bleeding,  tissue injury, clip migration, and inadequate sampling. Informed written consent was given. The usual time-out protocol was performed immediately prior to the procedure. Using sterile technique and 1% Lidocaine as local anesthetic, under direct ultrasound visualization, a 12 gauge spring-loaded device was used to perform biopsy of the left breast mass at the 11:30 o'clock axis, 3 cm from the nipple,using a lateral approach. At the conclusion of the procedure, a heart shaped tissue marker clip was deployed into the biopsy cavity. Follow-up 2-view mammogram was performed and dictated separately. IMPRESSION: Ultrasound-guided biopsy of the left breast mass at the 11:30 o'clock axis. No apparent complications. Electronically Signed: By: Franki Cabot M.D. On: 05/22/2016 14:30    EKG: normal sinus rhythm, LVH  Echo: 09/19/2015 - Left ventricle: The cavity size was normal. Wall thickness was   normal. Systolic function was mildly reduced. The estimated   ejection fraction was in the range of 45% to 50%. Diffuse   hypokinesis. Possible mild hypokinesis of the basal-midlateral   myocardium. - Aortic valve: There was mild stenosis. There was mild   regurgitation. Valve area (VTI): 1.74 cm^2. Valve area (Vmax):   1.61 cm^2. Valve area (Vmean): 1.67 cm^2. - Mitral valve: Calcified annulus. There was mild regurgitation.   Valve area by pressure half-time: 1.83 cm^2. Valve area by   continuity equation (using LVOT flow): 2.24 cm^2. - Left atrium: The atrium was mildly dilated. - Right atrium: The atrium was mildly dilated. - Pulmonary arteries: Systolic pressure was mildly increased. PA   peak pressure: 37 mm Hg (S).  Cardiac cath: 10/24/2015  Nuclear stress EF: 53%.  There was no ST segment deviation noted during stress.  Defect 1: There is a small defect of severe severity present in the basal inferior and mid inferior location.  Findings consistent with prior myocardial infarction.  This is a low  risk study.  The left ventricular ejection fraction is mildly decreased (45-54%).   There is a small severe non-reversible defect in the basal and mid inferior walls consistent with prior infarction and no evidence of ischemia.  Medical decision making:  Discussed care with the patient Discussed care with the physician on the phone Reviewed labs and imaging personally Reviewed prior records  ASSESSMENT AND PLAN:  This is a 83 y.o. female with known CAD presented with hypertensive urgency and chest pain. Recent stress test negative for ischemia in 2017. She is being taken off coreg but continues to be bradycardiac since she was still taking coreg at home.    Principal Problem:   Angina at rest [facility]) Active Problems:   DM (diabetes mellitus) ([place])   CAD (coronary artery disease)   HTN (hypertension)   GERD (gastroesophageal reflux disease)   Hyponatremia   CKD (chronic kidney disease), stage III   Hyperthyroidism   Left renal artery stenosis (HCC)   Bradycardia   Hypertensive urgency   Stable angina, CCS IV in the presence of known CAD, improved with NTG - continue aspirin, NTG, avoid BB, maximize medical therpy control BP, monitor on telemetry, cycle troponin, serial ECG  Hypertensive urgency with left renal artery stenting  - agree with primary care team management, avoid BB due to bradycardia,       Signed, Flossie Dibble, MD MS 06/15/2016, 7:58 AM

## 2016-06-15 NOTE — ED Notes (Signed)
Patient transported to X-ray 

## 2016-06-15 NOTE — Progress Notes (Signed)
Patient seen and evaluated earlier this am by my associate. Cardiology on board  Pt has prn clonidine for elevated BP. Has extensive allergy list with many different BP medications complicating care and choices available to use  Continue plan outlined by cardiology and my associate.   Will reassess next am. Vital signs reviewed and clonidine is currently on board prn and scheduled  Jalexis Breed, Linward Foster

## 2016-06-15 NOTE — H&P (Signed)
History and Physical    [name] [id number] DOB: [birth year] DOA: 06/14/2016  PCP: Irven Shelling, MD   Patient coming from: [facility]   Chief Complaint: Transient bradycardia, hypertension, jaw pain   HPI: [name] is a 83 y.o. female with medical history significant for hypertension, diet-controlled diabetes mellitus, renal artery stenosis status post left renal artery stent, chronic kidney disease stage III, chronic anemia, coronary artery disease with known RCA occlusion not amenable to PCI, and hyperthyroidism who presents to the emergency department for evaluation of jaw pain. Patient reports that she had been in her usual state of health until she developed a general sense of malaise this evening, checked her pulse, and found to be in the 30s. She noted her blood pressure to be greater than [id number] systolic at this time as well. She had recently been taken off of Coreg secondary to intermittent bradycardia, but reports taking a dose due to the high blood pressure. She also noted a pain in her jaw, described as a dull ache, moderate in intensity, localized, with no appreciable alleviating or exacerbating factors, and no associated nausea, dyspnea, or diaphoresis. She reached an on-call provider with her cardiology clinic, described her symptoms, and was advised to seek evaluation in the emergency department. Patient has a history of MI in 2007 and is followed closely by cardiology. She took a baby aspirin and one dose of nitroglycerin at home with improvement in her symptoms. EMS was called out and completed the 324 mg aspirin en route to the hospital, and also administer 2 more doses of sublingual nitroglycerin for ongoing anginal symptoms.  ED Course: Upon arrival to the ED, patient is found to be afebrile, saturating well on room air, hypertensive to 215/80, and with normal heart rate of respirations. EKG features a sinus rhythm with LVH by voltage criteria and  repolarization abnormality. Chest x-ray is negative for acute cardiopulmonary disease. Chemistry panels notable for hyponatremia to 126 and a serum creatinine 1.22 which appears consistent with her baseline. CBC is notable for a stable normocytic anemia with hemoglobin 10.4. Initial troponin is within normal limits at 0.01. Patient had a brief recurrence in central chest pressure while in the ED and Nitropaste was applied. She is given a dose of clonidine by the ED physician and blood pressure normalized. Cardiology consultation was obtained by the ED physician, and a medical admission was advised for ACS rule-out. Patient will be observed in the stepdown unit for ongoing evaluation and management of jaw ache and chest pressure concerning for possible unstable angina.  Review of Systems:  All other systems reviewed and apart from HPI, are negative.  Past Medical History:  Diagnosis Date  . Bradycardia    a. Coreg decreased due to HR low 40s on event monitor 2017 -> further decreased due to HR upper 30s in 01/2016.  [name] CAD (coronary artery disease)    a. RV infarct 2007 c/b high grade AV block with total occlusion of RCA, unable to treat with PCI. b. Nuc 10/2015: scar but no ischemia.  . Carotid artery disease ([place])   . Chronic anemia   . Chronic lower back pain   . CKD (chronic kidney disease), stage III   . Complication of anesthesia    "almost died when I had my breast reduction"  . Dyslipidemia   . GERD (gastroesophageal reflux disease)   . Heart murmur   . High cholesterol   . History of hiatal hernia   . HTN (hypertension)   .  Hyperthyroidism   . Meniere disease    "for years" takes Antivert 3 times per day (01/31/2016)  . Myocardial infarction 05-Aug-2005   "almost died"  . Near syncope   . Osteoporosis   . Renal artery stenosis ([facility])    a. left renal artery stent Aug 06, 2010. b. s/p repeat left renal stenting 01/2016.  . Stroke [facility])    a. Prior infarcts seen on head CT.  [name] Type II diabetes  mellitus ([place])    "controlled w/diet and exercise only" (01/31/2016)    Past Surgical History:  Procedure Laterality Date  . ANTERIOR CERVICAL DECOMP/DISCECTOMY FUSION  02/2002   Archie Endo 08/26/2010  . APPENDECTOMY    . BACK SURGERY    . BLADDER SUSPENSION  x3  . BREAST LUMPECTOMY  11/2007   breast needle-localized lumpectomy/notes 08/12/2012  . CARDIAC CATHETERIZATION  08-05-2005   "couldn't get stents in; I almost died'  . CATARACT EXTRACTION W/ INTRAOCULAR LENS  IMPLANT, BILATERAL Bilateral   . CATARACT EXTRACTION W/ INTRAOCULAR LENS  IMPLANT, BILATERAL Bilateral   . CHOLECYSTECTOMY OPEN    . FRACTURE SURGERY    . HIP FRACTURE SURGERY Right 08/05/2009   "added rod in my leg and a ball"  . KYPHOPLASTY N/A 03/20/2015   Procedure: THORACIC TWELVE KYPHOPLASTY;  Surgeon: Jovita Gamma, MD;  Location: [place] [facility];  Service: Neurosurgery;  Laterality: N/A;  . KYPHOPLASTY N/A 04/23/2015   Procedure: KYPHOPLASTY - LUMBAR ONE;  Surgeon: Jovita Gamma, MD;  Location: [place] [facility];  Service: Neurosurgery;  Laterality: N/A;  L1 Kyphoplasty  . NECK HARDWARE REMOVAL  11/2002   "replaced screw"  . PERIPHERAL VASCULAR CATHETERIZATION Left 12/20/2014   Procedure: Renal Angiography;  Surgeon: Wellington Hampshire, MD;  Location: [place] CV LAB;  Service: Cardiovascular;  Laterality: Left;  . PERIPHERAL VASCULAR CATHETERIZATION N/A 01/31/2016   Procedure: Renal Angiography;  Surgeon: Lorretta Harp, MD;  Location: [place] CV LAB;  Service: Cardiovascular;  Laterality: N/A;  . PERIPHERAL VASCULAR CATHETERIZATION N/A 01/31/2016   Procedure: Abdominal Aortogram;  Surgeon: Lorretta Harp, MD;  Location: [place] CV LAB;  Service: Cardiovascular;  Laterality: N/A;  . PERIPHERAL VASCULAR CATHETERIZATION  01/31/2016   Procedure: Peripheral Vascular Intervention;  Surgeon: Lorretta Harp, MD;  Location: [place] CV LAB;  Service: Cardiovascular;;  . REDUCTION MAMMAPLASTY Bilateral 1985  . renal arteriogram   12/2014   occluded previous Lt renal stent and patent rt renal artery.  [name] RENAL ARTERY STENT Left 08/06/10  . TONSILLECTOMY    . TUBAL LIGATION    . VAGINAL HYSTERECTOMY  ~ 1995  . WRIST FRACTURE SURGERY Left 08/06/10     reports that she has quit smoking. Her smoking use included Cigarettes. She has a 30.00 pack-year smoking history. She has never used smokeless tobacco. She reports that she does not drink alcohol or use drugs.  Allergies  Allergen Reactions  . Fentanyl Anaphylaxis    Cardiac arrest and coded  . Fish Allergy Anaphylaxis and Rash  . Morphine And Related Anaphylaxis    Cardiac arrest and coded Per Dr. Conley Canal patient has taken percocet without issue  . Singulair [Montelukast] Palpitations and Other (See Comments)    Increased BP and HR  . Actonel [Risedronate Sodium] Rash and Other (See Comments)    weakness  . Dilaudid [Hydromorphone Hcl] Rash and Other (See Comments)    Crying and screaming  . Fosamax [Alendronate Sodium] Rash and Other (See Comments)    Weakness and myalgias  .  Hydralazine Diarrhea and Other (See Comments)    Swelling in stomach  . Lipitor [Atorvastatin] Swelling and Rash    Tongue swelling   . Lisinopril Diarrhea, Rash and Cough  . Micardis [Telmisartan] Diarrhea and Rash  . Septra [Sulfamethoxazole-Trimethoprim] Nausea And Vomiting and Rash  . Tekturna [Aliskiren] Swelling and Rash    Swelling of tongue  . Tricor [Fenofibrate] Rash and Other (See Comments)    Flu symptoms  . Amlodipine Diarrhea  . Avapro [Irbesartan] Rash  . Cardio Complete [Nutritional Supplements] Rash  . Cardizem [Diltiazem Hcl] Rash  . Ciprofloxacin Rash and Other (See Comments)    Black spot on body  . Codeine Rash  . Cyclobenzaprine Nausea Only  . Forteo [Teriparatide (Recombinant)] Rash  . Inapsine [Droperidol] Rash  . Ivp Dye [Iodinated Diagnostic Agents] Rash  . Shellfish Allergy Rash  . Tussionex Pennkinetic Er [Hydrocod Polst-Cpm Polst Er] Itching and Rash     Family History  Problem Relation Age of Onset  . Diabetes type II Mother   . Heart attack Mother   . Heart disease Mother   . Congestive Heart Failure Father   . Pulmonary embolism Father   . Alzheimer's disease Sister   . Heart disease Sister   . Heart disease Brother   . Healthy Sister   . Healthy Sister   . Stroke Brother   . Dementia Brother     VASCULAR  . Stroke Brother   . Diabetes type II Brother   . Other Brother     KILLED IN PLANE CRASH  . Pulmonary disease Sister   . Heart attack Sister   . Kidney failure Sister   . Diabetes type II Sister   . Diabetes type II Sister   . Kidney failure Sister      Prior to Admission medications   Medication Sig Start Date End Date Taking? Authorizing Provider  acetaminophen (TYLENOL) 500 MG tablet Take 500 mg by mouth every 6 (six) hours as needed (for pain.).    Historical Provider, MD  aspirin 81 MG tablet Take 1 tablet (81 mg total) by mouth daily. 02/01/16   Dayna N Dunn, PA-C  azelastine (ASTELIN) 0.1 % nasal spray Place 1-2 sprays into both nostrils daily as needed. For allergies/congestion. 12/14/15   Historical Provider, MD  betamethasone valerate lotion (VALISONE) 0.1 % Apply 1 application topically 2 (two) times daily as needed. For irritated skin. 12/06/15   Historical Provider, MD  calcium carbonate (CALCIUM 600) 1500 (600 Ca) MG TABS tablet Take 600 mg by mouth 2 (two) times daily.    Historical Provider, MD  carvedilol (COREG) 6.25 MG tablet TAKE 1 TABLET BY MOUTH TWICE A DAY 03/26/16   Belva Crome, MD  cholecalciferol (VITAMIN D) 1000 UNITS tablet Take 2,000 Units by mouth daily.     Historical Provider, MD  cloNIDine (CATAPRES) 0.1 MG tablet Take 0.1 mg by mouth 3 (three) times daily with meals. 10/25/15   Historical Provider, MD  cloNIDine (CATAPRES) 0.2 MG tablet Take 0.2 mg by mouth at bedtime. 11/24/15   Historical Provider, MD  clopidogrel (PLAVIX) 75 MG tablet Take 1 tablet (75 mg total) by mouth daily.  02/01/16   Dayna N Dunn, PA-C  Cod Liver Oil CAPS Take 1 capsule by mouth daily.    Historical Provider, MD  denosumab (PROLIA) 60 MG/ML SOLN injection Inject 60 mg into the skin every 6 (six) months. Administer in upper arm, thigh, or abdomen    Historical Provider, MD  docusate sodium (COLACE) 100 MG capsule Take 100 mg by mouth every evening.     Historical Provider, MD  fexofenadine (ALLEGRA) 180 MG tablet Take 180 mg by mouth daily.    Historical Provider, MD  meclizine (ANTIVERT) 25 MG tablet Take 25 mg by mouth 3 (three) times daily.     Historical Provider, MD  methimazole (TAPAZOLE) 5 MG tablet Take 5 mg by mouth daily. 08/27/15   Historical Provider, MD  Multiple Vitamins-Minerals (PRESERVISION AREDS) CAPS Take 1 capsule by mouth daily.     Historical Provider, MD  nitroGLYCERIN (NITROSTAT) 0.4 MG SL tablet Place 0.4 mg under the tongue every 5 (five) minutes as needed for chest pain (3 doses MAX).    Historical Provider, MD  olmesartan (BENICAR) 40 MG tablet Take 20 mg by mouth 2 (two) times daily.     Historical Provider, MD  pantoprazole (PROTONIX) 40 MG tablet TAKE 1 TABLET BY MOUTH TWICE A DAY BEFORE A MEAL 03/24/16   Dayna N Dunn, PA-C  PREMARIN vaginal cream Place 1-2 g vaginally every Wednesday and Saturday.  10/06/14   Historical Provider, MD  simvastatin (ZOCOR) 20 MG tablet Take 20 mg by mouth daily.    Historical Provider, MD  vitamin E 400 UNIT capsule Take 400 Units by mouth daily.    Historical Provider, MD    Physical Exam: Vitals:   06/15/16 0115 06/15/16 0130 06/15/16 0145 06/15/16 0200  BP: (!) 141/54 173/64 160/57 176/71  Pulse: (!) 59 65 62 65  Resp: 18 14 15 15   Temp:      TempSrc:      SpO2: 94% 98% 97% 98%  Weight:      Height:          Constitutional: NAD, calm, comfortable Eyes: PERTLA, lids and conjunctivae normal ENMT: Mucous membranes are moist. Posterior pharynx clear of any exudate or lesions.   Neck: normal, supple, no masses, no  thyromegaly Respiratory: clear to auscultation bilaterally, no wheezing, no crackles. Normal respiratory effort.    Cardiovascular: S1 & S2 heard, regular rate and rhythm, soft systolic murmur at USB. No extremity edema. No significant JVD. Abdomen: No distension, no tenderness, no masses palpated. Bowel sounds normal.  Musculoskeletal: no clubbing / cyanosis. No joint deformity upper and lower extremities.    Skin: no significant rashes, lesions, ulcers. Warm, dry, well-perfused. Neurologic: CN 2-12 grossly intact. Sensation intact, DTR normal. Strength 5/5 in all 4 limbs.  Psychiatric: Normal judgment and insight. Alert and oriented x 3. Normal mood and affect.     Labs on Admission: I have personally reviewed following labs and imaging studies  CBC:  Recent Labs Lab 06/14/16 2337 06/15/16 0130  WBC 7.5  --   NEUTROABS 4.9  --   HGB 10.4* 10.2*  HCT 30.6* 30.0*  MCV 81.4  --   PLT 218  --    Basic Metabolic Panel:  Recent Labs Lab 06/14/16 2337 06/15/16 0130  NA 126* 127*  K 4.8 4.9  CL 94* 93*  CO2 24  --   GLUCOSE 134* 127*  BUN 19 23*  CREATININE 1.22* 1.20*  CALCIUM 9.0  --    GFR: Estimated Creatinine Clearance: 27.3 mL/min (by C-G formula based on SCr of 1.2 mg/dL (H)). Liver Function Tests: No results for input(s): AST, ALT, ALKPHOS, BILITOT, PROT, ALBUMIN in the last 168 hours. No results for input(s): LIPASE, AMYLASE in the last 168 hours. No results for input(s): AMMONIA in the last 168 hours. Coagulation Profile:  No results for input(s): INR, PROTIME in the last 168 hours. Cardiac Enzymes: No results for input(s): CKTOTAL, CKMB, CKMBINDEX, TROPONINI in the last 168 hours. BNP (last 3 results) No results for input(s): PROBNP in the last 8760 hours. HbA1C: No results for input(s): HGBA1C in the last 72 hours. CBG: No results for input(s): GLUCAP in the last 168 hours. Lipid Profile: No results for input(s): CHOL, HDL, LDLCALC, TRIG, CHOLHDL,  LDLDIRECT in the last 72 hours. Thyroid Function Tests: No results for input(s): TSH, T4TOTAL, FREET4, T3FREE, THYROIDAB in the last 72 hours. Anemia Panel: No results for input(s): VITAMINB12, FOLATE, FERRITIN, TIBC, IRON, RETICCTPCT in the last 72 hours. Urine analysis:    Component Value Date/Time   COLORURINE YELLOW 09/04/2015 [place] 09/04/2015 1518   LABSPEC 1.021 09/04/2015 1518   PHURINE 7.0 09/04/2015 1518   GLUCOSEU NEGATIVE 09/04/2015 1518   HGBUR NEGATIVE 09/04/2015 1518   BILIRUBINUR NEGATIVE 09/04/2015 1518   KETONESUR NEGATIVE 09/04/2015 1518   PROTEINUR 100 (A) 09/04/2015 1518   UROBILINOGEN 0.2 02/26/2015 0812   NITRITE NEGATIVE 09/04/2015 1518   LEUKOCYTESUR NEGATIVE 09/04/2015 1518   Sepsis Labs: @LABRCNTIP (procalcitonin:4,lacticidven:4) )No results found for this or any previous visit (from the past 240 hour(s)).   Radiological Exams on Admission: Dg Chest 2 View  Result Date: 06/15/2016 CLINICAL DATA:  Acute onset of jaw pain and generalized chest pain. Initial encounter. EXAM: CHEST  2 VIEW COMPARISON:  Chest radiograph performed 09/04/2015 FINDINGS: The lungs are well-aerated and clear. There is no evidence of focal opacification, pleural effusion or pneumothorax. The heart is normal in size; the mediastinal contour is within normal limits. No acute osseous abnormalities are seen. Cervical spinal fusion hardware is noted. Chronic compression deformities are noted of the lower thoracic spine, with changes of vertebroplasty. Diffuse calcification is seen along the abdominal aorta. IMPRESSION: 1. No acute cardiopulmonary process seen. 2. Chronic compression deformities along the lower thoracic spine, with changes of vertebroplasty. 3. Diffuse aortic atherosclerosis. Electronically Signed   By: Garald Balding M.D.   On: 06/15/2016 00:15    EKG: Independently reviewed. Sinus rhythm, LVH with repolarization abnormality.   Assessment/Plan  1. Chest  pain, CAD  - Pt has hx of MI in 2007 with RCA occlusion on cath, but not amenable to PCI - She has been managed at home with ASA 81, Plavix, Zocor, and olmesartan; Coreg stopped recently d/t intermittent bradycardia  - She presents with jaw ache, then a central chest pressure after transient bradycardia to 30's at home with SBP >200 - ASA 324 mg and NTG x3 given PTA with resolution of sxs; sxs began to redevelop in ED and nitropaste was placed  - Cardiology is consulting and much appreciated, will follow-up recommendations  - Monitor on telemetry for ischemic changes, obtain serial troponin measurements, repeat EKG    2. Hypertension with hypertensive urgency  - Pt with hx of RAS s/p left renal artery stent; she is managed with clonidine and olmesartan at home - Continue clonidine as needed, avoiding beta-blockade in light of intermittent bradycardia to 30's    3. CKD stage III  - SCr is 1.22 on admission, appears consistent with her baseline  - Avoid nephrotoxins where possible, renally-dose medications    4. Hyponatremia - Serum sodium 126 on admission; intermittently low in past  - Appears roughly euvolemic on admission; check TSH and cortisol; repeat chemistries in am  5. Hyperthyroidism  - Checking TSH as above  - Continue methimazole  6. Normocytic anemia - Hgb is 10.4 on admission and stable, no apparent bleeding  - Likely secondary to chronic disease/CKD     DVT prophylaxis: sq heparin  Code Status: Full  Family Communication: Husband updated at bedside Disposition Plan: Observe in SDU Consults called: Cardiology Admission status: Observation    Vianne Bulls, MD [facility] Pager [phone]  If 7PM-7AM, please contact night-coverage [email or web address] Password [email or web address]  06/15/2016, 2:13 AM

## 2016-06-16 ENCOUNTER — Telehealth: Payer: Self-pay | Admitting: Interventional Cardiology

## 2016-06-16 DIAGNOSIS — R001 Bradycardia, unspecified: Secondary | ICD-10-CM | POA: Diagnosis not present

## 2016-06-16 DIAGNOSIS — E1122 Type 2 diabetes mellitus with diabetic chronic kidney disease: Secondary | ICD-10-CM | POA: Diagnosis not present

## 2016-06-16 DIAGNOSIS — I160 Hypertensive urgency: Secondary | ICD-10-CM | POA: Diagnosis not present

## 2016-06-16 DIAGNOSIS — I208 Other forms of angina pectoris: Secondary | ICD-10-CM | POA: Diagnosis not present

## 2016-06-16 DIAGNOSIS — I25118 Atherosclerotic heart disease of native coronary artery with other forms of angina pectoris: Secondary | ICD-10-CM | POA: Diagnosis not present

## 2016-06-16 DIAGNOSIS — I129 Hypertensive chronic kidney disease with stage 1 through stage 4 chronic kidney disease, or unspecified chronic kidney disease: Secondary | ICD-10-CM | POA: Diagnosis not present

## 2016-06-16 MED ORDER — FLUCONAZOLE 150 MG PO TABS
150.0000 mg | ORAL_TABLET | Freq: Once | ORAL | Status: AC
  Administered 2016-06-16: 150 mg via ORAL
  Filled 2016-06-16: qty 1

## 2016-06-16 MED ORDER — ISOSORBIDE MONONITRATE ER 30 MG PO TB24
30.0000 mg | ORAL_TABLET | Freq: Every day | ORAL | Status: DC
  Administered 2016-06-16: 30 mg via ORAL
  Filled 2016-06-16: qty 1

## 2016-06-16 MED ORDER — CLONIDINE HCL 0.2 MG PO TABS
0.2000 mg | ORAL_TABLET | Freq: Three times a day (TID) | ORAL | Status: DC
  Administered 2016-06-16: 0.2 mg via ORAL
  Filled 2016-06-16: qty 1

## 2016-06-16 MED ORDER — ISOSORBIDE MONONITRATE ER 30 MG PO TB24: 30.0000 mg | ORAL_TABLET | Freq: Every day | ORAL | 0 refills | Status: DC

## 2016-06-16 NOTE — Progress Notes (Signed)
Progress Note  Patient Name: [name] Date of Encounter: 06/16/2016  Primary Cardiologist: Dr. Tamala Julian / Dr. [name]  Subjective   Patient is feeling well; denies chest pain, SOB, and palpitations. She denies jaw pain and is ready for discharge.  Inpatient Medications    Scheduled Meds: . acetaminophen  1,000 mg Oral BID  . aspirin EC  81 mg Oral Daily  . calcium carbonate  1,250 mg Oral BID WC  . cholecalciferol  2,000 Units Oral Daily  . cloNIDine  0.2 mg Oral TID WC  . clopidogrel  75 mg Oral Daily  . docusate sodium  100 mg Oral Daily  . docusate sodium  200 mg Oral QHS  . heparin  5,000 Units Subcutaneous Q8H  . loratadine  10 mg Oral Daily  . methimazole  5 mg Oral Daily  . nitroGLYCERIN  0.5 inch Topical Q8H  . pantoprazole  40 mg Oral BID AC  . simvastatin  20 mg Oral Once per day on Mon Wed Fri  . sodium chloride flush  3 mL Intravenous Q12H   Continuous Infusions:  PRN Meds: sodium chloride, acetaminophen, ALPRAZolam, cloNIDine, meclizine, ondansetron (ZOFRAN) IV, sodium chloride flush   Vital Signs    Vitals:   06/16/16 0358 06/16/16 0500 06/16/16 0600 06/16/16 0753  BP: (!) 178/60 (!) 113/44 (!) 159/42 (!) 173/67  Pulse: 73   68  Resp: 20   (!) 22  Temp: 98.6 F (37 C)   97.3 F (36.3 C)  TempSrc: Oral   Oral  SpO2: 98%   97%  Weight:  106 lb 11.2 oz (48.4 kg)    Height:        Intake/Output Summary (Last 24 hours) at 06/16/16 0947 Last data filed at 06/15/16 1000  Gross per 24 hour  Intake                3 ml  Output                0 ml  Net                3 ml   Filed Weights   06/14/16 2312 06/16/16 0500  Weight: 115 lb (52.2 kg) 106 lb 11.2 oz (48.4 kg)     Physical Exam   General: Well developed, well nourished, female appearing in no acute distress. Head: Normocephalic, atraumatic.  Neck: Supple without bruits, JVD. Lungs:  Resp regular and unlabored, CTA. Heart: RRR, S1, S2, holosystolic murmur 3/6. Abdomen: Soft,  non-tender, non-distended with normoactive bowel sounds. No hepatomegaly. No rebound/guarding. No obvious abdominal masses. Extremities: No clubbing, cyanosis, no edema. Distal pedal pulses are 2+ bilaterally. Neuro: Alert and oriented X 3. Moves all extremities spontaneously. Psych: Normal affect.  Labs    Chemistry Recent Labs Lab 06/14/16 2337 06/15/16 0130 06/15/16 0839  NA 126* 127* 128*  K 4.8 4.9 4.0  CL 94* 93* 98*  CO2 24  --  22  GLUCOSE 134* 127* 137*  BUN 19 23* 15  CREATININE 1.22* 1.20* 1.03*  CALCIUM 9.0  --  9.1  GFRNONAA 40*  --  49*  GFRAA 46*  --  57*  ANIONGAP 8  --  8     Hematology Recent Labs Lab 06/14/16 2337 06/15/16 0130  WBC 7.5  --   RBC 3.76*  --   HGB 10.4* 10.2*  HCT 30.6* 30.0*  MCV 81.4  --   MCH 27.7  --   MCHC 34.0  --  RDW 14.6  --   PLT 218  --     Cardiac Enzymes Recent Labs Lab 06/15/16 0228 06/15/16 0839 06/15/16 1303  TROPONINI <0.03 <0.03 <0.03    Recent Labs Lab 06/14/16 2349  TROPIPOC 0.01     BNPNo results for input(s): BNP, PROBNP in the last 168 hours.   DDimer No results for input(s): DDIMER in the last 168 hours.   Radiology    Dg Chest 2 View  Result Date: 06/15/2016 CLINICAL DATA:  Acute onset of jaw pain and generalized chest pain. Initial encounter. EXAM: CHEST  2 VIEW COMPARISON:  Chest radiograph performed 09/04/2015 FINDINGS: The lungs are well-aerated and clear. There is no evidence of focal opacification, pleural effusion or pneumothorax. The heart is normal in size; the mediastinal contour is within normal limits. No acute osseous abnormalities are seen. Cervical spinal fusion hardware is noted. Chronic compression deformities are noted of the lower thoracic spine, with changes of vertebroplasty. Diffuse calcification is seen along the abdominal aorta. IMPRESSION: 1. No acute cardiopulmonary process seen. 2. Chronic compression deformities along the lower thoracic spine, with changes of  vertebroplasty. 3. Diffuse aortic atherosclerosis. Electronically Signed   By: Garald Balding M.D.   On: 06/15/2016 00:15     Telemetry    Sinus arrhythmia with bradycardia fluctuating 50-70s - Personally Reviewed  ECG    06/16/16: LVH, Minimal ST depressions in V3-V6, similar to yesterday and to 06/06/16 - Personally Reviewed   Cardiac Studies   Nuclear stress test 10/24/15:  Nuclear stress EF: 53%.  There was no ST segment deviation noted during stress.  Defect 1: There is a small defect of severe severity present in the basal inferior and mid inferior location.  Findings consistent with prior myocardial infarction.  This is a low risk study.  The left ventricular ejection fraction is mildly decreased (45-54%).   There is a small severe non-reversible defect in the basal and mid inferior walls consistent with prior infarction and no evidence of ischemia.   Echo: 09/19/2015 - Left ventricle: The cavity size was normal. Wall thickness was normal. Systolic function was mildly reduced. The estimated ejection fraction was in the range of 45% to 50%. Diffuse hypokinesis. Possible mild hypokinesis of the basal-midlateral myocardium. - Aortic valve: There was mild stenosis. There was mild regurgitation. Valve area (VTI): 1.74 cm^2. Valve area (Vmax): 1.61 cm^2. Valve area (Vmean): 1.67 cm^2. - Mitral valve: Calcified annulus. There was mild regurgitation. Valve area by pressure half-time: 1.83 cm^2. Valve area by continuity equation (using LVOT flow): 2.24 cm^2. - Left atrium: The atrium was mildly dilated. - Right atrium: The atrium was mildly dilated. - Pulmonary arteries: Systolic pressure was mildly increased. PA peak pressure: 37 mm Hg (s).   Patient Profile     83 y.o. female with known CAD (total occlusion of RCA) and renal artery stenosis with left renal stent placed in 2012. Angiogram on 01/31/16 with total occlusion of left renal artery; stent had  restenosed and she underwent successful re-stenting. Dopplers on 02/15/16 with normal flow in left renal artery.  She has HTN, HLD, mild aortic stenosis, and DM. She presented with hypertensive urgency and chest pain. Recent stress test negative for ischemia in 2017. She is being taken off coreg but continues to be bradycardiac since she was still taking coreg at home. She presented to ED with hypertensive urgency and left jaw pain.   Assessment & Plan    1. Hypertensive urgency - s/p left renal artery stenting (  2017) - she was last seen in our office on 06/06/16 by Dr. Tamala Julian with symptomatic bradycardia. Coreg was weaned and discontinued at that time. In his note, if she has continued bradycardia, may need a PPM. - HR: 50s currenlty - continue clonidine in hospital, continue to hold BB 2/ bradycardia - pressures continues to be elevated; however, 173/67 was before her AM dose of clonidine; 133/84 following AM clonidine   - patient states she is compliant on clonidine; may consider imdur in presence of CAD   2. Chest pain / jaw pain / CAD - she has known CAD - RCA chronic occlusion (2007) - chest pain is improved with NTG - troponin x 3 negative; EKG with previously documented ST changes - chest pain may be secondary to HTN urgency, relief by NTG may be due to BP lowering effects - pt denies current chest pain and jaw pain   3. CKD stage III - avoid nephrotoxic agents, including ACEI/ARB at this time - GFR 40; sCr 1.03 (1.22) - per primary team   4. Vaginitis secondary to yeast infection s/p ABX administration - pt requests diflucan from primary team     Signed, Ledora Bottcher , PA-C 9:47 AM 06/16/2016 Pager: [phone]  Personally seen and examined. Agree with above.  Feeling well, would like to go home. This seems reasonable. RRR, Lungs clear, AAO x 3.   Angina  - Will start Imdur 30. Side effect to look out for is HA.   - Off Coreg because of bradycardia  HTN  -  challenging to control  - many allergies  - Starting Imdur. Continue clonidine. Improved.   Sinus bradycardia  - has appt with Dr. Curt Bears on Wed.   - HR at times dips into the upper 30's.   OK with DC home.  Candee Furbish, MD

## 2016-06-16 NOTE — Telephone Encounter (Signed)
Spoke with pt and she states she was wondering if she would get to go home from the hospital today.  She also wanted to know since Coreg was d/c'ed if she would be started on something different.  She states PA rounded on her today and had mentioned possibly switching from Coreg to something else.  Advised pt to speak with the hospital staff in regards to her questions as they would be the ones to make those decisions.  Pt also asked if she could keep appt with Dr. Curt Bears if she got out of the hospital.  Advised she could keep appt for 06/18/16 as long as she was discharged.  Pt verbalized understanding and was appreciative for assistance.

## 2016-06-16 NOTE — Telephone Encounter (Signed)
New message    Pt husband is calling asking for RN to call him about his wife.

## 2016-06-16 NOTE — Discharge Summary (Addendum)
Physician Discharge Summary  [name] ZOX:[id number] DOB: 06/04/1931 DOA: 06/14/2016  PCP: Irven Shelling, MD  Admit date: 06/14/2016 Discharge date: 06/16/2016  Time spent: > 35 minutes  Recommendations for Outpatient Follow-up:  1. Re-Assess BP 2. Ensure f/u with cardiologist   Discharge Diagnoses:  Principal Problem:   Angina at rest [facility]) Active Problems:   DM (diabetes mellitus) ([place])   CAD (coronary artery disease)   HTN (hypertension)   GERD (gastroesophageal reflux disease)   Hyponatremia   CKD (chronic kidney disease), stage III   Hyperthyroidism   Left renal artery stenosis ([facility])   Bradycardia   Hypertensive urgency   Discharge Condition: stable  Diet recommendation: Heart healthy  Filed Weights   06/14/16 2312 06/16/16 0500  Weight: 52.2 kg (115 lb) 48.4 kg (106 lb 11.2 oz)    History of present illness:  83 y.o. female with known CAD (total occlusion of RCA) and renal artery stenosis with left renal stent placed in 2012. Angiogram on 01/31/16 with total occlusion of left renal artery; stent had restenosed and she underwent successful re-stenting. Dopplers on 02/15/16 with normal flow in left renal artery.  She has HTN, HLD, mild aortic stenosis, and DM. She presented with hypertensive urgency and chest pain. Recent stress test negative for ischemia in 2017. She is being taken off coreg but continues to be bradycardiac since she was still taking coreg at home. She presented to ED with hypertensive urgency and left jaw pain.  Hospital Course:  Evaluated by Cardiology who recommended the following.  1. Hypertensive urgency - s/p left renal artery stenting (2017) - she was last seen in our office on 06/06/16 by Dr. Tamala Julian with symptomatic bradycardia. Coreg was weaned and discontinued at that time. In his note, if she has continued bradycardia, may need a PPM. - HR: 50s currenlty - continue clonidine in hospital, continue to hold BB 2/ bradycardia -  pressures continues to be elevated; however, 173/67 was before her AM dose of clonidine; 133/84 following AM clonidine   - patient states she is compliant on clonidine; may consider imdur in presence of CAD   2. Chest pain / jaw pain / CAD - she has known CAD - RCA chronic occlusion (2007) - chest pain is improved with NTG - troponin x 3 negative; EKG with previously documented ST changes - chest pain may be secondary to HTN urgency, relief by NTG may be due to BP lowering effects - pt denies current chest pain and jaw pain   3. CKD stage III - avoid nephrotoxic agents, including ACEI/ARB at this time - GFR 40; sCr 1.03 (1.22) - per primary team   4. Vaginitis secondary to yeast infection s/p ABX administration - pt requests diflucan from primary team   Will provide diflucan dose prior to discharge.  Procedures:  None  Consultations:  Cardiology   Discharge Exam: Vitals:   06/16/16 1200 06/16/16 1400  BP: (!) 151/88 (!) 108/59  Pulse: (!) 54 (!) 57  Resp:  17  Temp: 97.5 F (36.4 C)     General: Pt in nad, alert and awake Cardiovascular: rrr, no rubs Respiratory: no increased wob, no wheezes  Discharge Instructions   Discharge Instructions    Call MD for:  difficulty breathing, headache or visual disturbances    Complete by:  As directed    Call MD for:  severe uncontrolled pain    Complete by:  As directed    Call MD for:  temperature >100.4  Complete by:  As directed    Diet - low sodium heart healthy    Complete by:  As directed    Discharge instructions    Complete by:  As directed    Please make sure patient follows up with Cardiologist.   Increase activity slowly    Complete by:  As directed      Current Discharge Medication List    START taking these medications   Details  isosorbide mononitrate (IMDUR) 30 MG 24 hr tablet Take 1 tablet (30 mg total) by mouth daily. Qty: 30 tablet, Refills: 0      CONTINUE these medications which  have NOT CHANGED   Details  acetaminophen (TYLENOL) 500 MG tablet Take 1,000 mg by mouth 2 (two) times daily.     aspirin 81 MG tablet Take 1 tablet (81 mg total) by mouth daily. Qty: 30 tablet, Refills: 11    azelastine (ASTELIN) 0.1 % nasal spray Place 1-2 sprays into both nostrils daily as needed. For allergies/congestion.    betamethasone valerate lotion (VALISONE) 0.1 % Apply 1 application topically 2 (two) times daily as needed. For irritated skin.    calcium carbonate (CALCIUM 600) 1500 (600 Ca) MG TABS tablet Take 1,200 mg by mouth 2 (two) times daily.     cholecalciferol (VITAMIN D) 1000 UNITS tablet Take 2,000 Units by mouth daily.     cloNIDine (CATAPRES) 0.1 MG tablet Take 0.1 mg by mouth 3 (three) times daily with meals.    clopidogrel (PLAVIX) 75 MG tablet Take 1 tablet (75 mg total) by mouth daily. Qty: 30 tablet, Refills: 11    Cod Liver Oil CAPS Take 1 capsule by mouth daily.    denosumab (PROLIA) 60 MG/ML SOLN injection Inject 60 mg into the skin every 6 (six) months. Administer in upper arm, thigh, or abdomen    docusate sodium (COLACE) 100 MG capsule Take 100-200 mg by mouth 2 (two) times daily. '100MG'$  in the morning and '200mg'$  in the evening    fexofenadine (ALLEGRA) 180 MG tablet Take 180 mg by mouth daily.    meclizine (ANTIVERT) 25 MG tablet Take 25 mg by mouth 3 (three) times daily.     methimazole (TAPAZOLE) 5 MG tablet Take 5 mg by mouth daily. Refills: 11    Multiple Vitamins-Minerals (PRESERVISION AREDS) CAPS Take 1 capsule by mouth once a week.     nitroGLYCERIN (NITROSTAT) 0.4 MG SL tablet Place 0.4 mg under the tongue every 5 (five) minutes as needed for chest pain (3 doses MAX).    pantoprazole (PROTONIX) 40 MG tablet TAKE 1 TABLET BY MOUTH TWICE A DAY BEFORE A MEAL Qty: 60 tablet, Refills: 11    simvastatin (ZOCOR) 20 MG tablet Take 20 mg by mouth 3 (three) times a week. Monday, Wednesday and Friday    vitamin E 400 UNIT capsule Take 2,000  Units by mouth daily.       STOP taking these medications     carvedilol (COREG) 6.25 MG tablet      olmesartan (BENICAR) 40 MG tablet      PREMARIN vaginal cream        Allergies  Allergen Reactions  . Fentanyl Anaphylaxis    Cardiac arrest and coded  . Fish Allergy Anaphylaxis and Rash  . Morphine And Related Anaphylaxis    Cardiac arrest and coded Per Dr. Conley Canal patient has taken percocet without issue  . Singulair [Montelukast] Palpitations and Other (See Comments)    Increased BP and HR  .  Actonel [Risedronate Sodium] Rash and Other (See Comments)    weakness  . Dilaudid [Hydromorphone Hcl] Rash and Other (See Comments)    Crying and screaming  . Fosamax [Alendronate Sodium] Rash and Other (See Comments)    Weakness and myalgias  . Hydralazine Diarrhea and Other (See Comments)    Swelling in stomach  . Lipitor [Atorvastatin] Swelling and Rash    Tongue swelling   . Lisinopril Diarrhea, Rash and Cough  . Micardis [Telmisartan] Diarrhea and Rash  . Septra [Sulfamethoxazole-Trimethoprim] Nausea And Vomiting and Rash  . Tekturna [Aliskiren] Swelling and Rash    Swelling of tongue  . Tricor [Fenofibrate] Rash and Other (See Comments)    Flu symptoms  . Amlodipine Diarrhea  . Avapro [Irbesartan] Rash  . Cardio Complete [Nutritional Supplements] Rash  . Cardizem [Diltiazem Hcl] Rash  . Ciprofloxacin Rash and Other (See Comments)    Black spot on body  . Codeine Rash  . Cyclobenzaprine Nausea Only  . Forteo [Teriparatide (Recombinant)] Rash  . Inapsine [Droperidol] Rash  . Ivp Dye [Iodinated Diagnostic Agents] Rash  . Shellfish Allergy Rash  . Tussionex Pennkinetic Er [Hydrocod Polst-Cpm Polst Er] Itching and Rash    The results of significant diagnostics from this hospitalization (including imaging, microbiology, ancillary and laboratory) are listed below for reference.    Significant Diagnostic Studies: Dg Chest 2 View  Result Date: 06/15/2016 CLINICAL  DATA:  Acute onset of jaw pain and generalized chest pain. Initial encounter. EXAM: CHEST  2 VIEW COMPARISON:  Chest radiograph performed 09/04/2015 FINDINGS: The lungs are well-aerated and clear. There is no evidence of focal opacification, pleural effusion or pneumothorax. The heart is normal in size; the mediastinal contour is within normal limits. No acute osseous abnormalities are seen. Cervical spinal fusion hardware is noted. Chronic compression deformities are noted of the lower thoracic spine, with changes of vertebroplasty. Diffuse calcification is seen along the abdominal aorta. IMPRESSION: 1. No acute cardiopulmonary process seen. 2. Chronic compression deformities along the lower thoracic spine, with changes of vertebroplasty. 3. Diffuse aortic atherosclerosis. Electronically Signed   By: Garald Balding M.D.   On: 06/15/2016 00:15   Mm Clip Placement Left  Result Date: 05/22/2016 CLINICAL DATA:  Status post ultrasound-guided biopsy of a left breast mass. EXAM: DIAGNOSTIC LEFT MAMMOGRAM POST ULTRASOUND BIOPSY COMPARISON:  Previous exam(s). FINDINGS: Mammographic images were obtained following ultrasound guided biopsy of the left breast mass at the 11:30 o'clock axis. At the conclusion of the procedure, a heart shaped tissue marker was placed at the biopsy site. Biopsy clip is well-positioned within the targeted mass. IMPRESSION: Biopsy clip well-positioned within the targeted mass in the left breast at the 11:30 o'clock axis. Final Assessment: Post Procedure Mammograms for Marker Placement Electronically Signed   By: Franki Cabot M.D.   On: 05/22/2016 14:48   [place] Lt Breast Bx W Loc Dev 1st Lesion Img Bx Spec US Guide  Addendum Date: 05/26/2016   ADDENDUM REPORT: 05/26/2016 11:01 ADDENDUM: Pathology revealed FAT NECROSIS of the Left breast, 11:30 o'clock. This was found to be discordant by Dr. Franki Cabot, with excision recommended. Pathology results were discussed with the patient by telephone.  The patient reported doing well after the biopsy with tenderness at the site. Post biopsy instructions and care were reviewed and questions were answered. The patient was encouraged to call [facility] for any additional concerns. Surgical consultation has been arranged with Dr. Erroll Luna at [facility] on June 09, 2016. Pathology results reported by Terie Purser, RN on 05/26/2016. Electronically Signed   By: Franki Cabot M.D.   On: 05/26/2016 11:01   Result Date: 05/26/2016 CLINICAL DATA:  Patient with a left breast mass presents today for ultrasound-guided biopsy. EXAM: ULTRASOUND GUIDED LEFT BREAST CORE NEEDLE BIOPSY COMPARISON:  Previous exam(s). PROCEDURE: I met with the patient and we discussed the procedure of ultrasound-guided biopsy, including benefits and alternatives. We discussed the high likelihood of a successful procedure. We discussed the risks of the procedure including infection, bleeding, tissue injury, clip migration, and inadequate sampling. Informed written consent was given. The usual time-out protocol was performed immediately prior to the procedure. Using sterile technique and 1% Lidocaine as local anesthetic, under direct ultrasound visualization, a 12 gauge spring-loaded device was used to perform biopsy of the left breast mass at the 11:30 o'clock axis, 3 cm from the nipple,using a lateral approach. At the conclusion of the procedure, a heart shaped tissue marker clip was deployed into the biopsy cavity. Follow-up 2-view mammogram was performed and dictated separately. IMPRESSION: Ultrasound-guided biopsy of the left breast mass at the 11:30 o'clock axis. No apparent complications. Electronically Signed: By: Franki Cabot M.D. On: 05/22/2016 14:30    Microbiology: Recent Results (from the past 240 hour(s))  MRSA PCR Screening     Status: None   Collection Time: 06/15/16  6:48 AM  Result Value Ref Range Status   MRSA by PCR  NEGATIVE NEGATIVE Final    Comment:        The GeneXpert MRSA Assay (FDA approved for NASAL specimens only), is one component of a comprehensive MRSA colonization surveillance program. It is not intended to diagnose MRSA infection nor to guide or monitor treatment for MRSA infections.      Labs: Basic Metabolic Panel:  Recent Labs Lab 06/14/16 2337 06/15/16 0130 06/15/16 0839  NA 126* 127* 128*  K 4.8 4.9 4.0  CL 94* 93* 98*  CO2 24  --  22  GLUCOSE 134* 127* 137*  BUN 19 23* 15  CREATININE 1.22* 1.20* 1.03*  CALCIUM 9.0  --  9.1   Liver Function Tests: No results for input(s): AST, ALT, ALKPHOS, BILITOT, PROT, ALBUMIN in the last 168 hours. No results for input(s): LIPASE, AMYLASE in the last 168 hours. No results for input(s): AMMONIA in the last 168 hours. CBC:  Recent Labs Lab 06/14/16 2337 06/15/16 0130  WBC 7.5  --   NEUTROABS 4.9  --   HGB 10.4* 10.2*  HCT 30.6* 30.0*  MCV 81.4  --   PLT 218  --    Cardiac Enzymes:  Recent Labs Lab 06/15/16 0228 06/15/16 0839 06/15/16 1303  TROPONINI <0.03 <0.03 <0.03   BNP: BNP (last 3 results)  Recent Labs  09/04/15 1324  BNP 400.7*    ProBNP (last 3 results) No results for input(s): PROBNP in the last 8760 hours.  CBG: No results for input(s): GLUCAP in the last 168 hours.  Signed:  Velvet Bathe MD.  [facility] 06/16/2016, 2:35 PM

## 2016-06-18 ENCOUNTER — Ambulatory Visit (INDEPENDENT_AMBULATORY_CARE_PROVIDER_SITE_OTHER): Payer: Medicare Other | Admitting: Cardiology

## 2016-06-18 ENCOUNTER — Encounter: Payer: Self-pay | Admitting: Cardiology

## 2016-06-18 VITALS — BP 180/70 | HR 72 | Ht 61.0 in | Wt 115.0 lb

## 2016-06-18 DIAGNOSIS — R001 Bradycardia, unspecified: Secondary | ICD-10-CM

## 2016-06-18 DIAGNOSIS — I10 Essential (primary) hypertension: Secondary | ICD-10-CM

## 2016-06-18 MED ORDER — ISOSORBIDE MONONITRATE ER 30 MG PO TB24: 15.0000 mg | ORAL_TABLET | Freq: Every day | ORAL | 6 refills | Status: DC

## 2016-06-18 MED ORDER — OLMESARTAN MEDOXOMIL 20 MG PO TABS: 20.0000 mg | ORAL_TABLET | Freq: Every day | ORAL | 6 refills | Status: DC

## 2016-06-18 NOTE — Patient Instructions (Addendum)
Medication Instructions:   Your physician has recommended you make the following change in your medication:  1) START Benicar 20 mg twice a day 2) DECREASE Imdur to 15 mg daily  (1/2 tablet)  --- If you need a refill on your cardiac medications before your next appointment, please call your pharmacy. ---  Labwork:  None ordered  Testing/Procedures:  None ordered  Follow-Up:  Your physician recommends that you schedule a follow-up appointment in: 2 weeks with pharmacist for BP check.   No follow up is needed at this time with Dr. Curt Bears.  He will see you on an as needed basis.   Thank you for choosing [facility]!!   Trinidad Curet, RN [phone]

## 2016-06-18 NOTE — Progress Notes (Signed)
Electrophysiology Office Note   Date:  06/18/2016   ID:  [name], DOB 22-May-1931, MRN [id number]  PCP:  Irven Shelling, MD  Cardiologist:  Tamala Julian Primary Electrophysiologist:  Yajahira Tison Meredith Leeds, MD    Chief Complaint  Patient presents with  . Follow-up    Bradycardia     History of Present Illness: [name] is a 83 y.o. female who presents today for electrophysiology evaluation.   She has a history of coronary artery disease with a known total occlusion of the right coronary, renal artery stenosis with left renal stent in 2010-07-22 and a recent angiogram suggesting total occlusion, severe labile hypertension, aortic stenosis, hyperlipidemia, diabetes, and recurrent syncope. She wore a cardiac monitor that showed transient heart rates into the 30s. He is at difficulties with ambulation due to balance issues.She is continuing to have episodes of vertigo. She has taken her Antivert today, but does continue to have dizziness. She was recently discharged from the hospital with chest pain and was found to be hypertensive. She ruled out for MI with negative troponin 3.   Today, she denies symptoms of palpitations, chest pain, shortness of breath, orthopnea, PND, lower extremity edema, claudication, dizziness, presyncope, syncope, bleeding, or neurologic sequela. The patient is tolerating medications without difficulties and is otherwise without complaint today.    Past Medical History:  Diagnosis Date  . Bradycardia    a. Coreg decreased due to HR low 40s on event monitor 07-22-15 -> further decreased due to HR upper 30s in 01/2016.  [name] CAD (coronary artery disease)    a. RV infarct July 21, 2005 c/b high grade AV block with total occlusion of RCA, unable to treat with PCI. b. Nuc 10/2015: scar but no ischemia.  . Carotid artery disease ([place])   . Chronic anemia   . Chronic lower back pain   . CKD (chronic kidney disease), stage III   . Complication of anesthesia    "almost died when I  had my breast reduction"  . Dyslipidemia   . GERD (gastroesophageal reflux disease)   . Heart murmur   . High cholesterol   . History of hiatal hernia   . HTN (hypertension)   . Hyperthyroidism   . Meniere disease    "for years" takes Antivert 3 times per day (01/31/2016)  . Myocardial infarction 21-Jul-2005   "almost died"  . Near syncope   . Osteoporosis   . Renal artery stenosis ([facility])    a. left renal artery stent July 22, 2010. b. s/p repeat left renal stenting 01/2016.  . Stroke [facility])    a. Prior infarcts seen on head CT.  [name] Type II diabetes mellitus ([place])    "controlled w/diet and exercise only" (01/31/2016)   Past Surgical History:  Procedure Laterality Date  . ANTERIOR CERVICAL DECOMP/DISCECTOMY FUSION  02/2002   Archie Endo 08/26/2010  . APPENDECTOMY    . BACK SURGERY    . BLADDER SUSPENSION  x3  . BREAST LUMPECTOMY  11/2007   breast needle-localized lumpectomy/notes 08/12/2012  . CARDIAC CATHETERIZATION  Jul 21, 2005   "couldn't get stents in; I almost died'  . CATARACT EXTRACTION W/ INTRAOCULAR LENS  IMPLANT, BILATERAL Bilateral   . CATARACT EXTRACTION W/ INTRAOCULAR LENS  IMPLANT, BILATERAL Bilateral   . CHOLECYSTECTOMY OPEN    . FRACTURE SURGERY    . HIP FRACTURE SURGERY Right Jul 21, 2009   "added rod in my leg and a ball"  . KYPHOPLASTY N/A 03/20/2015   Procedure: THORACIC TWELVE KYPHOPLASTY;  Surgeon: Jovita Gamma, MD;  Location: [place] [facility];  Service: Neurosurgery;  Laterality: N/A;  . KYPHOPLASTY N/A 04/23/2015   Procedure: KYPHOPLASTY - LUMBAR ONE;  Surgeon: Jovita Gamma, MD;  Location: [place] [facility]UR[facility] OR[facility];  Service: Neurosurgery;  Laterality: N/A;  L1 Kyphoplasty  . NECK HARDWARE REMOVAL  11/2002   "replaced screw"  . PERIPHERAL VASCULAR CATHETERIZATION Left 12/20/2014   Procedure: Renal Angiography;  Surgeon: Wellington Hampshire, MD;  Location: [place] CV LAB;  Service: Cardiovascular;  Laterality: Left;  . PERIPHERAL VASCULAR CATHETERIZATION N/A 01/31/2016   Procedure: Renal Angiography;   Surgeon: Lorretta Harp, MD;  Location: [place] CV LAB;  Service: Cardiovascular;  Laterality: N/A;  . PERIPHERAL VASCULAR CATHETERIZATION N/A 01/31/2016   Procedure: Abdominal Aortogram;  Surgeon: Lorretta Harp, MD;  Location: [place] CV LAB;  Service: Cardiovascular;  Laterality: N/A;  . PERIPHERAL VASCULAR CATHETERIZATION  01/31/2016   Procedure: Peripheral Vascular Intervention;  Surgeon: Lorretta Harp, MD;  Location: [place] CV LAB;  Service: Cardiovascular;;  . REDUCTION MAMMAPLASTY Bilateral 1985  . renal arteriogram  12/2014   occluded previous Lt renal stent and patent rt renal artery.  [name] RENAL ARTERY STENT Left 2012  . TONSILLECTOMY    . TUBAL LIGATION    . VAGINAL HYSTERECTOMY  ~ 1995  . WRIST FRACTURE SURGERY Left 2012     Current Outpatient Prescriptions  Medication Sig Dispense Refill  . acetaminophen (TYLENOL) 500 MG tablet Take 1,000 mg by mouth 2 (two) times daily.     [name] aspirin 81 MG tablet Take 1 tablet (81 mg total) by mouth daily. 30 tablet 11  . azelastine (ASTELIN) 0.1 % nasal spray Place 1-2 sprays into both nostrils daily as needed. For allergies/congestion.    . betamethasone valerate lotion (VALISONE) 0.1 % Apply 1 application topically 2 (two) times daily as needed. For irritated skin.    . calcium carbonate (CALCIUM 600) 1500 (600 Ca) MG TABS tablet Take 1,200 mg by mouth 2 (two) times daily.     . cholecalciferol (VITAMIN D) 1000 UNITS tablet Take 2,000 Units by mouth daily.     . cloNIDine (CATAPRES) 0.1 MG tablet Take 0.1 mg by mouth 3 (three) times daily with meals.    . clopidogrel (PLAVIX) 75 MG tablet Take 1 tablet (75 mg total) by mouth daily. 30 tablet 11  . Cod Liver Oil CAPS Take 1 capsule by mouth daily.    [name] denosumab (PROLIA) 60 MG/ML SOLN injection Inject 60 mg into the skin every 6 (six) months. Administer in upper arm, thigh, or abdomen    . docusate sodium (COLACE) 100 MG capsule Take 100-200 mg by mouth 2 (two) times  daily. 100MG  in the morning and 200mg  in the evening    . fexofenadine (ALLEGRA) 180 MG tablet Take 180 mg by mouth daily.    . isosorbide mononitrate (IMDUR) 30 MG 24 hr tablet Take 0.5 tablets (15 mg total) by mouth daily. 15 tablet 6  . meclizine (ANTIVERT) 25 MG tablet Take 25 mg by mouth 3 (three) times daily.     . methimazole (TAPAZOLE) 5 MG tablet Take 5 mg by mouth daily.  11  . Multiple Vitamins-Minerals (PRESERVISION AREDS) CAPS Take 1 capsule by mouth once a week.     . nitroGLYCERIN (NITROSTAT) 0.4 MG SL tablet Place 0.4 mg under the tongue every 5 (five) minutes as needed for chest pain (3 doses MAX).    [name] pantoprazole (PROTONIX) 40 MG tablet TAKE 1 TABLET BY MOUTH TWICE  A DAY BEFORE A MEAL 60 tablet 11  . simvastatin (ZOCOR) 20 MG tablet Take 20 mg by mouth 3 (three) times a week. Monday, Wednesday and Friday    . vitamin E 400 UNIT capsule Take 2,000 Units by mouth daily.     [name] olmesartan (BENICAR) 20 MG tablet Take 1 tablet (20 mg total) by mouth daily. 60 tablet 6   No current facility-administered medications for this visit.     Allergies:   Fentanyl; Fish allergy; Morphine and related; Singulair [montelukast]; Actonel [risedronate sodium]; Dilaudid [hydromorphone hcl]; Fosamax [alendronate sodium]; Hydralazine; Lipitor [atorvastatin]; Lisinopril; Micardis [telmisartan]; Septra [sulfamethoxazole-trimethoprim]; Tekturna [aliskiren]; Tricor [fenofibrate]; Amlodipine; Avapro [irbesartan]; Cardio complete [nutritional supplements]; Cardizem [diltiazem hcl]; Ciprofloxacin; Codeine; Cyclobenzaprine; Forteo [teriparatide (recombinant)]; Inapsine [droperidol]; Ivp dye [iodinated diagnostic agents]; Shellfish allergy; and Tussionex pennkinetic er [hydrocod polst-cpm polst er]   Social History:  The patient  reports that she has quit smoking. Her smoking use included Cigarettes. She has a 30.00 pack-year smoking history. She has never used smokeless tobacco. She reports that she does not  drink alcohol or use drugs.   Family History:  The patient's family history includes Alzheimer's disease in her sister; Congestive Heart Failure in her father; Dementia in her brother; Diabetes type II in her brother, mother, sister, and sister; Healthy in her sister and sister; Heart attack in her mother and sister; Heart disease in her brother, mother, and sister; Kidney failure in her sister and sister; Other in her brother; Pulmonary disease in her sister; Pulmonary embolism in her father; Stroke in her brother and brother.    ROS:  Please see the history of present illness.   Otherwise, review of systems is positive for Chills, fatigue, palpitations, chest pressure, hearing loss, visual changes, cough, diarrhea, anxiety, difficulty urinating, back pain, balance problems, dizziness.   All other systems are reviewed and negative.    PHYSICAL EXAM: VS:  BP (!) 180/70   Pulse 72   Ht 5\' 1"  (1.549 m)   Wt 115 lb (52.2 kg)   BMI 21.73 kg/m  , BMI Body mass index is 21.73 kg/m. GEN: Well nourished, well developed, in no acute distress  HEENT: normal  Neck: no JVD, carotid bruits, or masses Cardiac: RRR; no murmurs, rubs, or gallops,no edema  Respiratory:  clear to auscultation bilaterally, normal work of breathing GI: soft, nontender, nondistended, + BS MS: no deformity or atrophy  Skin: warm and dry Neuro:  Strength and sensation are intact Psych: euthymic mood, full affect  EKG:  EKG is not ordered today. Personal review of the ekg ordered shows sinus rhythm, LVH by voltage  Recent Labs: 09/04/2015: B Natriuretic Peptide 400.7 06/14/2016: Platelets 218 06/15/2016: BUN 15; Creatinine, Ser 1.03; Hemoglobin 10.2; Potassium 4.0; Sodium 128; TSH 3.513    Lipid Panel     Component Value Date/Time   CHOL  05/10/2008 0635    177        ATP III CLASSIFICATION:  <200     mg/dL   Desirable  200-239  mg/dL   Borderline High  >=240    mg/dL   High          TRIG 546 (H) 05/10/2008 0635    HDL 31 (L) 05/10/2008 0635   CHOLHDL 5.7 05/10/2008 0635   VLDL UNABLE TO CALCULATE IF TRIGLYCERIDE OVER 400 mg/dL 05/10/2008 0635   LDLCALC  05/10/2008 0635    UNABLE TO CALCULATE IF TRIGLYCERIDE OVER 400 mg/dL        Total Cholesterol/HDL:CHD  Risk Coronary Heart Disease Risk Table                     Men   Women  1/2 Average Risk   3.4   3.3  Average Risk       5.0   4.4  2 X Average Risk   9.6   7.1  3 X Average Risk  23.4   11.0        Use the calculated Patient Ratio above and the CHD Risk Table to determine the patient's CHD Risk.        ATP III CLASSIFICATION (LDL):  <100     mg/dL   Optimal  100-129  mg/dL   Near or Above                    Optimal  130-159  mg/dL   Borderline  160-189  mg/dL   High  >190     mg/dL   Very High     Wt Readings from Last 3 Encounters:  06/18/16 115 lb (52.2 kg)  06/16/16 106 lb 11.2 oz (48.4 kg)  06/06/16 115 lb (52.2 kg)      Other studies Reviewed: Additional studies/ records that were reviewed today include: SPECT 10/2015, TTE 09/2015  Review of the above records today demonstrates:   Nuclear stress EF: 53%.  There was no ST segment deviation noted during stress.  Defect 1: There is a small defect of severe severity present in the basal inferior and mid inferior location.  Findings consistent with prior myocardial infarction.  This is a low risk study.  The left ventricular ejection fraction is mildly decreased (45-54%).   There is a small severe non-reversible defect in the basal and mid inferior walls consistent with prior infarction and no evidence of ischemia.   - Left ventricle: The cavity size was normal. Wall thickness was   normal. Systolic function was mildly reduced. The estimated   ejection fraction was in the range of 45% to 50%. Diffuse   hypokinesis. Possible mild hypokinesis of the basal-midlateral   myocardium. - Aortic valve: There was mild stenosis. There was mild   regurgitation. Valve area (VTI):  1.74 cm^2. Valve area (Vmax):   1.61 cm^2. Valve area (Vmean): 1.67 cm^2. - Mitral valve: Calcified annulus. There was mild regurgitation.   Valve area by pressure half-time: 1.83 cm^2. Valve area by   continuity equation (using LVOT flow): 2.24 cm^2. - Left atrium: The atrium was mildly dilated. - Right atrium: The atrium was mildly dilated. - Pulmonary arteries: Systolic pressure was mildly increased. PA   peak pressure: 37 mm Hg (S).  Telemetry 09/2015  Underlying rhythm is NSR with HR range 37-114 bpm. Ave HR 65 bpm.  Syncope episode not associated with arrhythmia   Underlying basic rhythm is NSR with occasional bradycardia as low as 37 bpm. Syncope not associated with rhythm disturbance. Possible early Sick sinus given occasional bradycardia below 50 bpm.  ASSESSMENT AND PLAN:  1.  Sick sinus syndrome: Currently with heart rates in the 70s after stopping her beta blocker. At this time, with normal heart rates, and normal heart rates at home by their monitoring, Maurina Fawaz not implant pacemaker. Should she have further episodes of bradycardia, we'll consider pacemaker at that time.  2. Coronary artery disease: Currently with an occluded RCA. Recent episode of chest pain in the hospital was ruled out for MI. No current chest pain.  3. Labile hypertension: s/p  renal artery stenting with improvement in BP she is intolerant of many blood pressure medications. In discussing her medications with her, she has been able to tolerate Benicar in the past. We'll restart that today at 20 mg twice a day and return in 2 weeks for blood pressure check.  Current medicines are reviewed at length with the patient today.   The patient does not have concerns regarding her medicines.  The following changes were made today:  Benicar   Labs/ tests ordered today include:  No orders of the defined types were placed in this encounter.    Disposition:   FU with Honora Searson as needed  Signed, Harshal Sirmon Meredith Leeds, MD  06/18/2016 11:46 AM     [facility] [address] [address] [phone] (office) [phone] (fax)

## 2016-06-19 ENCOUNTER — Telehealth: Payer: Self-pay

## 2016-06-19 NOTE — Telephone Encounter (Addendum)
Prior auth for [employer] submitted to [name]. Faxed letter received with approval of Benicar. Ref # [id number]. This is valid through 04/13/2017. Local pharmacy notified.

## 2016-06-20 ENCOUNTER — Telehealth: Payer: Self-pay | Admitting: Cardiology

## 2016-06-20 DIAGNOSIS — Z79899 Other long term (current) drug therapy: Secondary | ICD-10-CM

## 2016-06-20 NOTE — Telephone Encounter (Signed)
Discussed symptoms pt describes.  Explained that difficulty seeing and reflux are not related SE to Imdur (reviewed w/ pharmacist). She reports yesterday almost falling and bruised her left shoulder secondary to dizziness.  She reports BP at 240 before meds and then "better at 192".  I informed patient that this was way too high and may be the cause of her "difficulty seeing". But also talked about how hard it will be to control her BP if renal artery has restenosis.  She is scheduled to see Dr. [name] on 3/27 for assessment of renal artery and if stent has re-occluded. She understands I will forward this to Dr. Tamala Julian to review and she may not get a call back till Monday. Advised to call office over weekend if she experiences worsening issues or go to the nearest emergency room. Patient verbalized understanding and agreeable to plan.

## 2016-06-20 NOTE — Telephone Encounter (Signed)
New message      Pt c/o medication issue:  1. Name of Medication: imdur 2. How are you currently taking this medication (dosage and times per day)?  30mg  3. Are you having a reaction (difficulty breathing--STAT)?  no 4. What is your medication issue?  Since starting medication, pt is c/o difficult seeing, headaches, acid reflux, dizziness and unsteady on her feet.  Could this be the medication?

## 2016-06-21 NOTE — Telephone Encounter (Signed)
Start furosemide 40 mg daily for BP control.  BMET 5 days later.

## 2016-06-23 ENCOUNTER — Telehealth: Payer: Self-pay | Admitting: Cardiology

## 2016-06-23 NOTE — Telephone Encounter (Signed)
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  Informed pt this was not being given because of edema but for the BP, secondary to kidney issue. Pt now understands and is agreeable to plan for starting med.

## 2016-06-23 NOTE — Telephone Encounter (Signed)
Follow Up:; ° ° °Returning your call. °

## 2016-06-23 NOTE — Telephone Encounter (Signed)
Spoke w/ husband (ok per wife).   Advised to have pt start Lasix 40 mg once daily. He is agreeable. Reports BPs avg 170s over weekend. F/u BMET scheduled for 3/20. Advised to call office if no improvement in pressure.  They have appt on Friday w/ pharmacist in BP clinic. Husband verbalized understanding and agreeable to plan.

## 2016-06-24 ENCOUNTER — Telehealth: Payer: Self-pay | Admitting: Interventional Cardiology

## 2016-06-24 ENCOUNTER — Telehealth: Payer: Self-pay

## 2016-06-24 MED ORDER — FUROSEMIDE 40 MG PO TABS: 40.0000 mg | ORAL_TABLET | Freq: Every day | ORAL | 2 refills | Status: DC

## 2016-06-24 NOTE — Telephone Encounter (Signed)
New message  Pt husband states that LASIX Rx was supposed to sent to pharmacy yesterday and it was not sent.   *STAT* If patient is at the pharmacy, call can be transferred to refill team.   1. Which medications need to be refilled? (please list name of each medication and dose if known) LASIX  2. Which pharmacy/location (including street and city if local pharmacy) is medication to be sent to? [facility] [address]  3. Do they need a 30 day or 90 day supply? 30 day supply

## 2016-06-24 NOTE — Telephone Encounter (Signed)
Message sent to Dr. Smith

## 2016-06-24 NOTE — Telephone Encounter (Signed)
Patient called about her HR 114 BP 181/92. Went over patient's medication list. Patient had recently taken her clonidine. Patient has not taken her lasix, but will take it after she picks up from pharmacy. Patient is not taking her imdur, patient stated it causes blindness and headaches. Put imdur on patients allergy list, per patient request. Asked patient to take BP again after taking her clonidine. BP 186/84 HR 91. Encouraged patient to take lasix and give our office a call if her BP does not improve. Will forward to Dr. Tamala Julian for further advisement.

## 2016-06-24 NOTE — Telephone Encounter (Signed)
Follow up    Pt called back after taking her medication   181/91 at 4p pulse 71

## 2016-06-24 NOTE — Telephone Encounter (Addendum)
called to inform pt that lasix 40 mg 1 t po qd was sent to pharmacy. pt aware.

## 2016-06-24 NOTE — Telephone Encounter (Signed)
Conversation  [facility])  June 23, 2016  Stanton Kidney, RN      8:39 AM  Note    Spoke w/ husband (ok per wife).   Advised to have pt start Lasix 40 mg once daily. He is agreeable. Reports BPs avg 170s over weekend. F/u BMET scheduled for 3/20. Advised to call office if no improvement in pressure.  They have appt on Friday w/ pharmacist in BP clinic. Husband verbalized understanding and agreeable to plan.

## 2016-06-25 ENCOUNTER — Telehealth: Payer: Self-pay | Admitting: Interventional Cardiology

## 2016-06-25 MED ORDER — NEBIVOLOL HCL 10 MG PO TABS: 10.0000 mg | ORAL_TABLET | Freq: Every day | ORAL | 3 refills | Status: DC

## 2016-06-25 NOTE — Telephone Encounter (Signed)
Mr. Hammer (pt husband) is calling states that he received the samples but states that the patient is allergic to some of the medications. Please call to discuss, thanks.

## 2016-06-25 NOTE — Telephone Encounter (Addendum)
Spoke with husband and he states that he was reading about the Bystolic and he was concerned about pt starting this medication  He states that insert mentioned not to take is thyroid problems, kidney issues or diabetes and pt has these problems.  Pt then gets on the phone and tells me she does have an allergy to BB and was told several years ago by Dr. Trilby Leaver to never take BB.  Pt states she started taking Furosemide 40mg  QD and BP was down yesterday and today.  Vitals today were 139/62, 88.  Pt would like to continue taking the Furosemide for now and monitor BP before starting another medication.  Pt currently scheduled for BMET and HTN clinic on 07/04/16.  Advised pt to continue monitoring BP. Pt also mentioned she maybe able to handle ACE inhibitors if they are needed in the future. Pt and husband verbalized understanding and were in agreement with this plan.  Will route to Dr. Tamala Julian to make him aware.

## 2016-06-25 NOTE — Telephone Encounter (Signed)
Reasonable.

## 2016-06-25 NOTE — Telephone Encounter (Signed)
Find out if we can use BYSTOLIC? We are in a situation where we don't have good choices due to allergy/intolerance. Start Bystolic 10 mg daily. Make sure she is not beta blocker intolerant.

## 2016-06-25 NOTE — Telephone Encounter (Signed)
Spoke with pt and husband.  Pt denies recalling any allergies or intolerances to BB.  Advised of recommendations per Dr. Tamala Julian.  Husband and pt verbalized understanding and were in agreement with this plan.

## 2016-07-01 ENCOUNTER — Other Ambulatory Visit: Payer: Medicare Other

## 2016-07-03 NOTE — Progress Notes (Signed)
Patient ID: [name]                 DOB: 10-Apr-1932                      MRN: [id number]     HPI: [name] is a 83 y.o. female patient of Dr. Tamala Julian with PMH below who presents today for hypertension evaluation. PMH includes sick sinus syndrome, CAD w/ occluded RCA, HTN, renal artery stenosis, GERD, DM, hyperthyroidism, CKD3, and HLD. She has quite a few medication intolerances. She was recently started on furosemide 40mg  daily. Her pressures remained elevated and it was recommended to start Bystolic 10mg  daily. Patient husband has since called stating that she will not start Bystolic as she is intolerant to beta blockers.   Patient presents today with her husband. She reports that she is feeling good on the olmesartan, but feels like she has a dry cough that has gotten worse since starting this medication. She says another doctor has advised that it was likely due to allergies. She acknowledges that she has few other options for her blood pressure, and that she would like to continue on this medication, even with the cough. She denies any fatigue or dizziness with this dose of clonidine.  Cardiac Hx: CAD, HTN, renal artery stenosis (left stent 2012, re-stent 01/2016), HLD  Current HTN meds:   Lasix 40mg  daily  Olmesartan 20mg  daily  Clonidine 0.1mg  TID  Previously tried: hydralazine - swelling in stomach/diarrhea, lisinopril - diarrhea, rash, cough, telmisartan - rash, Tekturna - swelling of tongue, Imdur - headache, blindness, amlodipine - diarrhea, irbesartan - rash, dilitazem - rash;   BP goal: <140/90  Family History: Mother- HTN, Father- CHF; Siblings- HTN, T2DM  Social History: Patient denies tobacco or alcohol use.   Diet: She and her husband live at [facility], so they have a restricted diet already. She eats chicken, is allergic to fish, and typically eats lots of vegetables. She has DM and avoids carbohydrates and excess sugars. She does occasionally add some salt to  her food.   Home BP readings:  This morning, her pressure was 140/78; HR 78; She says that some pressures are in the 150s, but are typically 130-140. She reports checking her blood pressure as frequently as hourly to monitor her heart rate.   Wt Readings from Last 3 Encounters:  06/18/16 115 lb (52.2 kg)  06/16/16 106 lb 11.2 oz (48.4 kg)  06/06/16 115 lb (52.2 kg)   BP Readings from Last 3 Encounters:  06/18/16 (!) 180/70  06/16/16 (!) 108/59  06/06/16 132/72   Pulse Readings from Last 3 Encounters:  06/18/16 72  06/16/16 (!) 57  06/06/16 64    Renal function: CrCl cannot be calculated (Unknown ideal weight.).  Past Medical History:  Diagnosis Date  . Bradycardia    a. Coreg decreased due to HR low 40s on event monitor 2017 -> further decreased due to HR upper 30s in 01/2016.  [name] CAD (coronary artery disease)    a. RV infarct 2007 c/b high grade AV block with total occlusion of RCA, unable to treat with PCI. b. Nuc 10/2015: scar but no ischemia.  . Carotid artery disease ([place])   . Chronic anemia   . Chronic lower back pain   . CKD (chronic kidney disease), stage III   . Complication of anesthesia    "almost died when I had my breast reduction"  . Dyslipidemia   .  GERD (gastroesophageal reflux disease)   . Heart murmur   . High cholesterol   . History of hiatal hernia   . HTN (hypertension)   . Hyperthyroidism   . Meniere disease    "for years" takes Antivert 3 times per day (01/31/2016)  . Myocardial infarction 2005/07/29   "almost died"  . Near syncope   . Osteoporosis   . Renal artery stenosis (HCC)    a. left renal artery stent 07/30/2010. b. s/p repeat left renal stenting 01/2016.  . Stroke [facility])    a. Prior infarcts seen on head CT.  [name] Type II diabetes mellitus ([place])    "controlled w/diet and exercise only" (01/31/2016)    Current Outpatient Prescriptions on File Prior to Visit  Medication Sig Dispense Refill  . acetaminophen (TYLENOL) 500 MG tablet Take 1,000 mg by  mouth 2 (two) times daily.     [name] aspirin 81 MG tablet Take 1 tablet (81 mg total) by mouth daily. 30 tablet 11  . azelastine (ASTELIN) 0.1 % nasal spray Place 1-2 sprays into both nostrils daily as needed. For allergies/congestion.    . betamethasone valerate lotion (VALISONE) 0.1 % Apply 1 application topically 2 (two) times daily as needed. For irritated skin.    . calcium carbonate (CALCIUM 600) 1500 (600 Ca) MG TABS tablet Take 1,200 mg by mouth 2 (two) times daily.     . cholecalciferol (VITAMIN D) 1000 UNITS tablet Take 2,000 Units by mouth daily.     . cloNIDine (CATAPRES) 0.1 MG tablet Take 0.1 mg by mouth 3 (three) times daily with meals.    . clopidogrel (PLAVIX) 75 MG tablet Take 1 tablet (75 mg total) by mouth daily. 30 tablet 11  . Cod Liver Oil CAPS Take 1 capsule by mouth daily.    [name] denosumab (PROLIA) 60 MG/ML SOLN injection Inject 60 mg into the skin every 6 (six) months. Administer in upper arm, thigh, or abdomen    . docusate sodium (COLACE) 100 MG capsule Take 100-200 mg by mouth 2 (two) times daily. 100MG  in the morning and 200mg  in the evening    . fexofenadine (ALLEGRA) 180 MG tablet Take 180 mg by mouth daily.    . furosemide (LASIX) 40 MG tablet Take 1 tablet (40 mg total) by mouth daily. (Patient not taking: Reported on 06/24/2016) 30 tablet 2  . meclizine (ANTIVERT) 25 MG tablet Take 25 mg by mouth 3 (three) times daily.     . methimazole (TAPAZOLE) 5 MG tablet Take 5 mg by mouth daily.  11  . Multiple Vitamins-Minerals (PRESERVISION AREDS) CAPS Take 1 capsule by mouth once a week.     . nebivolol (BYSTOLIC) 10 MG tablet Take 1 tablet (10 mg total) by mouth daily. 90 tablet 3  . nitroGLYCERIN (NITROSTAT) 0.4 MG SL tablet Place 0.4 mg under the tongue every 5 (five) minutes as needed for chest pain (3 doses MAX).    [name] olmesartan (BENICAR) 20 MG tablet Take 1 tablet (20 mg total) by mouth daily. 60 tablet 6  . pantoprazole (PROTONIX) 40 MG tablet TAKE 1 TABLET BY MOUTH  TWICE A DAY BEFORE A MEAL 60 tablet 11  . simvastatin (ZOCOR) 20 MG tablet Take 20 mg by mouth 3 (three) times a week. Monday, Wednesday and Friday    . vitamin E 400 UNIT capsule Take 2,000 Units by mouth daily.      No current facility-administered medications on file prior to visit.     Allergies  Allergen Reactions  . Fentanyl Anaphylaxis    Cardiac arrest and coded  . Fish Allergy Anaphylaxis and Rash  . Morphine And Related Anaphylaxis    Cardiac arrest and coded Per Dr. Conley Canal patient has taken percocet without issue  . Singulair [Montelukast] Palpitations and Other (See Comments)    Increased BP and HR  . Actonel [Risedronate Sodium] Rash and Other (See Comments)    weakness  . Dilaudid [Hydromorphone Hcl] Rash and Other (See Comments)    Crying and screaming  . Fosamax [Alendronate Sodium] Rash and Other (See Comments)    Weakness and myalgias  . Hydralazine Diarrhea and Other (See Comments)    Swelling in stomach  . Lipitor [Atorvastatin] Swelling and Rash    Tongue swelling   . Lisinopril Diarrhea, Rash and Cough  . Micardis [Telmisartan] Diarrhea and Rash  . Septra [Sulfamethoxazole-Trimethoprim] Nausea And Vomiting and Rash  . Tekturna [Aliskiren] Swelling and Rash    Swelling of tongue  . Tricor [Fenofibrate] Rash and Other (See Comments)    Flu symptoms  . Imdur [Isosorbide Dinitrate] Other (See Comments)    Headaches and blindness  . Amlodipine Diarrhea  . Avapro [Irbesartan] Rash  . Cardio Complete [Nutritional Supplements] Rash  . Cardizem [Diltiazem Hcl] Rash  . Ciprofloxacin Rash and Other (See Comments)    Black spot on body  . Codeine Rash  . Cyclobenzaprine Nausea Only  . Forteo [Teriparatide (Recombinant)] Rash  . Inapsine [Droperidol] Rash  . Ivp Dye [Iodinated Diagnostic Agents] Rash  . Shellfish Allergy Rash  . Tussionex Pennkinetic Er [Hydrocod Polst-Cpm Polst Er] Itching and Rash    There were no vitals taken for this  visit.   Assessment/Plan: Hypertension: Patient's blood pressure is controlled and <140/90. 1. Continue olmesartan 20 mg, furosemide 40 mg, and clonidine 0.1 mg TID.  We advised patient to limit her blood pressure monitoring to once daily. We recommended that she try a different OTC antihistamine and see if this helps with her cough.  Follow up with HTN clinic as needed.   Patient was seen with [name], PharmD Candidate 2018.   Thank you, [name], [facility]  07/03/2016 8:38 PM

## 2016-07-04 ENCOUNTER — Ambulatory Visit (INDEPENDENT_AMBULATORY_CARE_PROVIDER_SITE_OTHER): Payer: Medicare Other | Admitting: Pharmacist

## 2016-07-04 ENCOUNTER — Other Ambulatory Visit (INDEPENDENT_AMBULATORY_CARE_PROVIDER_SITE_OTHER): Payer: Medicare Other

## 2016-07-04 VITALS — BP 130/78 | HR 78

## 2016-07-04 DIAGNOSIS — I701 Atherosclerosis of renal artery: Secondary | ICD-10-CM | POA: Diagnosis not present

## 2016-07-04 DIAGNOSIS — I10 Essential (primary) hypertension: Secondary | ICD-10-CM

## 2016-07-04 DIAGNOSIS — Z79899 Other long term (current) drug therapy: Secondary | ICD-10-CM | POA: Diagnosis not present

## 2016-07-04 NOTE — Patient Instructions (Signed)
Return for a a follow up appointment as needed.  Take your BP meds as follows: Continue olmesartan (Benicar) 20 mg daily, furosemide (Lasix) 40 mg daily and clonidine 0.1 mg TID.  For allergic rhinitis, the over the counter options are Allegra (fexofenidine), Zyrtec (cetirizine), Claritin (loratidine), and Xyzal (levocetirizine). Try changing the Allegra to Xyzal.  Bring all of your meds, your BP cuff and your record of home blood pressures to your next appointment.  Exercise as you're able, try to walk approximately 30 minutes per day.  Keep salt intake to a minimum, especially watch canned and prepared boxed foods.  Eat more fresh fruits and vegetables and fewer canned items.  Avoid eating in fast food restaurants.    HOW TO TAKE YOUR BLOOD PRESSURE: . Rest 5 minutes before taking your blood pressure. .  Don't smoke or drink caffeinated beverages for at least 30 minutes before. . Take your blood pressure before (not after) you eat. . Sit comfortably with your back supported and both feet on the floor (don't cross your legs). . Elevate your arm to heart level on a table or a desk. . Use the proper sized cuff. It should fit smoothly and snugly around your bare upper arm. There should be enough room to slip a fingertip under the cuff. The bottom edge of the cuff should be 1 inch above the crease of the elbow. . Ideally, take 3 measurements at one sitting and record the average.

## 2016-07-05 LAB — BASIC METABOLIC PANEL
BUN / CREAT RATIO: 18 (ref 12–28)
BUN: 22 mg/dL (ref 8–27)
CALCIUM: 9.6 mg/dL (ref 8.7–10.3)
CHLORIDE: 86 mmol/L — AB (ref 96–106)
CO2: 27 mmol/L (ref 18–29)
Creatinine, Ser: 1.2 mg/dL — ABNORMAL HIGH (ref 0.57–1.00)
GFR, EST AFRICAN AMERICAN: 49 mL/min/{1.73_m2} — AB (ref >59)
GFR, EST NON AFRICAN AMERICAN: 42 mL/min/{1.73_m2} — AB (ref >59)
Glucose: 102 mg/dL — ABNORMAL HIGH (ref 65–99)
POTASSIUM: 4.2 mmol/L (ref 3.5–5.2)
Sodium: 131 mmol/L — ABNORMAL LOW (ref 134–144)

## 2016-07-08 ENCOUNTER — Encounter: Payer: Self-pay | Admitting: Cardiovascular Disease

## 2016-07-08 ENCOUNTER — Ambulatory Visit (INDEPENDENT_AMBULATORY_CARE_PROVIDER_SITE_OTHER): Payer: Medicare Other | Admitting: Cardiovascular Disease

## 2016-07-08 VITALS — BP 130/71 | HR 88 | Ht 61.0 in | Wt 114.8 lb

## 2016-07-08 DIAGNOSIS — I779 Disorder of arteries and arterioles, unspecified: Secondary | ICD-10-CM

## 2016-07-08 DIAGNOSIS — I701 Atherosclerosis of renal artery: Secondary | ICD-10-CM | POA: Diagnosis not present

## 2016-07-08 DIAGNOSIS — I739 Peripheral vascular disease, unspecified: Principal | ICD-10-CM

## 2016-07-08 NOTE — Assessment & Plan Note (Signed)
History of bilateral internal carotid artery stenosis suspected since ultrasound 09/20/15 left greater than right. We'll reschedule carotid Doppler studies on her in June of this year.

## 2016-07-08 NOTE — Assessment & Plan Note (Signed)
History of left renal artery stenting by myself 01/31/16 for renal vascular hypertension. She had renal artery stenting by Dr. Irish Lack in 2012. She underwent re-angiography because of recurrent uncontrolled hypertension by Dr. Fletcher Anon 12/20/14 which suggested her stent was totally occluded. He did not attempt to recanalize this. I restarted her 01/31/16 demonstrated a small channel and therefore was able to restart her with a 5 mm x 15 mm long Herculink balloon expandable stent with an excellent result. Her blood pressure control improved. Her most recent renal Doppler study performed 02/15/16 revealed a widely patent stent in the left kidney measuring 8.7 cm. Her blood pressure today is 130/71.

## 2016-07-08 NOTE — Progress Notes (Signed)
07/08/2016 [name]   07-28-1931  [id number]  Primary Physician Irven Shelling, MD Primary Cardiologist: Lorretta Harp MD Renae Gloss  HPI:  [name] is a delightful 83 year old female fell appearing married Caucasian female accompanied by her husband [name]. I last saw her in the office 02/27/16. She was referred by Dr. Tamala Julian for peripheral vascular evaluation because of difficult call to control hypertension the setting of a occluded left renal artery stent. She had a left renal artery stent placed by Dr. Irish Lack in 2012 for renal vascular hypertension. After that her blood pressure was much better controlled. She also has a history of coronary artery disease with a known occluded RCA with normal LV function and she is a symptomatically from this. Her blood pressures were recorded in the controlled after the renal artery stent implantation for several years however last year she began to develop more difficult to control blood pressures. Renal Dopplers however did not suggest renal artery stenosis and in addition showed that her renal dimensions were symmetric. Dr. Fletcher Anon performed angiography on her 12/20/14 demonstrating an occluded left renal artery stent although there appeared to be a perfusion in the renal artery beyond this and the renal dimension remained fairly normal at 9 cm.her most recent renal Doppler studies performed 12/07/15 again revealed preservation of her left renal dimensions. Because of continued elevations in her blood pressure we decided to proceed with re-angiography and potential intervention which I did on 01/31/16. I did demonstrate a small channel in her low mostly obstructed left renal artery stent. I was able to restent her with a 5 mm x 15 mm long Herculink stent was stent with excellent angiographic and clinical result. Her postprocedure Dopplers revealed present bilaterally patent. Her blood pressures have been under much better control. She  is otherwise asymptomatic.   Current Outpatient Prescriptions  Medication Sig Dispense Refill  . acetaminophen (TYLENOL) 500 MG tablet Take 1,000 mg by mouth 2 (two) times daily.     [name] aspirin 81 MG tablet Take 1 tablet (81 mg total) by mouth daily. 30 tablet 11  . azelastine (ASTELIN) 0.1 % nasal spray Place 1-2 sprays into both nostrils daily as needed. For allergies/congestion.    . betamethasone valerate lotion (VALISONE) 0.1 % Apply 1 application topically 2 (two) times daily as needed. For irritated skin.    . calcium carbonate (CALCIUM 600) 1500 (600 Ca) MG TABS tablet Take 1,200 mg by mouth 2 (two) times daily.     . cholecalciferol (VITAMIN D) 1000 UNITS tablet Take 2,000 Units by mouth daily.     . cloNIDine (CATAPRES) 0.1 MG tablet Take 0.1 mg by mouth 3 (three) times daily with meals.    . clopidogrel (PLAVIX) 75 MG tablet Take 1 tablet (75 mg total) by mouth daily. 30 tablet 11  . Cod Liver Oil CAPS Take 1 capsule by mouth daily.    [name] denosumab (PROLIA) 60 MG/ML SOLN injection Inject 60 mg into the skin every 6 (six) months. Administer in upper arm, thigh, or abdomen    . docusate sodium (COLACE) 100 MG capsule Take 100-200 mg by mouth 2 (two) times daily. 100MG  in the morning and 200mg  in the evening    . fexofenadine (ALLEGRA) 180 MG tablet Take 180 mg by mouth daily.    . furosemide (LASIX) 40 MG tablet Take 1 tablet (40 mg total) by mouth daily. 30 tablet 2  . levocetirizine (XYZAL) 5 MG tablet Take 5 mg  by mouth every evening.    . meclizine (ANTIVERT) 25 MG tablet Take 25 mg by mouth 3 (three) times daily.     . methimazole (TAPAZOLE) 5 MG tablet Take 5 mg by mouth daily.  11  . Multiple Vitamins-Minerals (PRESERVISION AREDS) CAPS Take 1 capsule by mouth once a week.     . nitroGLYCERIN (NITROSTAT) 0.4 MG SL tablet Place 0.4 mg under the tongue every 5 (five) minutes as needed for chest pain (3 doses MAX).    [name] olmesartan (BENICAR) 20 MG tablet Take 1 tablet (20 mg total) by  mouth daily. 60 tablet 6  . pantoprazole (PROTONIX) 40 MG tablet TAKE 1 TABLET BY MOUTH TWICE A DAY BEFORE A MEAL 60 tablet 11  . simvastatin (ZOCOR) 20 MG tablet Take 20 mg by mouth 3 (three) times a week. Monday, Wednesday and Friday    . vitamin E 400 UNIT capsule Take 2,000 Units by mouth daily.      No current facility-administered medications for this visit.     Allergies  Allergen Reactions  . Fentanyl Anaphylaxis    Cardiac arrest and coded  . Fish Allergy Anaphylaxis and Rash  . Morphine And Related Anaphylaxis    Cardiac arrest and coded Per Dr. Conley Canal patient has taken percocet without issue  . Singulair [Montelukast] Palpitations and Other (See Comments)    Increased BP and HR  . Actonel [Risedronate Sodium] Rash and Other (See Comments)    weakness  . Dilaudid [Hydromorphone Hcl] Rash and Other (See Comments)    Crying and screaming  . Fosamax [Alendronate Sodium] Rash and Other (See Comments)    Weakness and myalgias  . Hydralazine Diarrhea and Other (See Comments)    Swelling in stomach  . Lipitor [Atorvastatin] Swelling and Rash    Tongue swelling   . Lisinopril Diarrhea, Rash and Cough  . Micardis [Telmisartan] Diarrhea and Rash  . Septra [Sulfamethoxazole-Trimethoprim] Nausea And Vomiting and Rash  . Tekturna [Aliskiren] Swelling and Rash    Swelling of tongue  . Tricor [Fenofibrate] Rash and Other (See Comments)    Flu symptoms  . Imdur [Isosorbide Dinitrate] Other (See Comments)    Headaches and blindness  . Amlodipine Diarrhea  . Avapro [Irbesartan] Rash  . Cardio Complete [Nutritional Supplements] Rash  . Cardizem [Diltiazem Hcl] Rash  . Ciprofloxacin Rash and Other (See Comments)    Black spot on body  . Codeine Rash  . Cyclobenzaprine Nausea Only  . Forteo [Teriparatide (Recombinant)] Rash  . Inapsine [Droperidol] Rash  . Ivp Dye [Iodinated Diagnostic Agents] Rash  . Shellfish Allergy Rash  . Tussionex Pennkinetic Er [Hydrocod Polst-Cpm  Polst Er] Itching and Rash    Social History   Social History  . Marital status: Married    Spouse name: N/A  . Number of children: N/A  . Years of education: N/A   Occupational History  . Not on file.   Social History Main Topics  . Smoking status: Former Smoker    Packs/day: 1.00    Years: 30.00    Types: Cigarettes  . Smokeless tobacco: Never Used     Comment: "quit smoking in 1989"  . Alcohol use No  . Drug use: No  . Sexual activity: Not on file   Other Topics Concern  . Not on file   Social History Narrative  . No narrative on file     Review of Systems: General: negative for chills, fever, night sweats or weight changes.  Cardiovascular: negative  for chest pain, dyspnea on exertion, edema, orthopnea, palpitations, paroxysmal nocturnal dyspnea or shortness of breath Dermatological: negative for rash Respiratory: negative for cough or wheezing Urologic: negative for hematuria Abdominal: negative for nausea, vomiting, diarrhea, bright red blood per rectum, melena, or hematemesis Neurologic: negative for visual changes, syncope, or dizziness All other systems reviewed and are otherwise negative except as noted above.    Blood pressure 130/71, pulse 88, height 5\' 1"  (1.549 m), weight 114 lb 12.8 oz (52.1 kg), SpO2 96 %.  General appearance: alert and no distress Neck: no adenopathy, no JVD, supple, symmetrical, trachea midline, thyroid not enlarged, symmetric, no tenderness/mass/nodules and Soft bilateral carotid bruits Lungs: clear to auscultation bilaterally Heart: regular rate and rhythm, S1, S2 normal, no murmur, click, rub or gallop Extremities: extremities normal, atraumatic, no cyanosis or edema  EKG not performed today  ASSESSMENT AND PLAN:   Left renal artery stenosis (HCC) History of left renal artery stenting by myself 01/31/16 for renal vascular hypertension. She had renal artery stenting by Dr. Irish Lack in 2012. She underwent re-angiography  because of recurrent uncontrolled hypertension by Dr. Fletcher Anon 12/20/14 which suggested her stent was totally occluded. He did not attempt to recanalize this. I restarted her 01/31/16 demonstrated a small channel and therefore was able to restart her with a 5 mm x 15 mm long Herculink balloon expandable stent with an excellent result. Her blood pressure control improved. Her most recent renal Doppler study performed 02/15/16 revealed a widely patent stent in the left kidney measuring 8.7 cm. Her blood pressure today is 130/71.  Carotid artery disease ([place]) History of bilateral internal carotid artery stenosis suspected since ultrasound 09/20/15 left greater than right. We'll reschedule carotid Doppler studies on her in June of this year.      Lorretta Harp MD FACP,FACC,FAHA, [facility] 07/08/2016 4:47 PM

## 2016-07-08 NOTE — Patient Instructions (Signed)
Medication Instructions: Your physician recommends that you continue on your current medications as directed. Please refer to the Current Medication list given to you today.  Testing/Procedures: Schedule Carotid doppler for June and Renal doppler for May (2018).  Follow-Up: Your physician recommends that you schedule a follow-up appointment as needed with Dr. [name].

## 2016-08-26 DIAGNOSIS — M26629 Arthralgia of temporomandibular joint, unspecified side: Secondary | ICD-10-CM | POA: Insufficient documentation

## 2016-08-26 HISTORY — DX: Arthralgia of temporomandibular joint, unspecified side: M26.629

## 2016-09-04 ENCOUNTER — Ambulatory Visit (HOSPITAL_COMMUNITY)
Admission: RE | Admit: 2016-09-04 | Discharge: 2016-09-04 | Disposition: A | Discharge status: Home / Self Care | Payer: Medicare Other | Source: Ambulatory Visit | Attending: Cardiovascular Disease | Admitting: Cardiovascular Disease

## 2016-09-04 ENCOUNTER — Ambulatory Visit (HOSPITAL_COMMUNITY)
Admission: RE | Admit: 2016-09-04 | Discharge: 2016-09-04 | Disposition: A | Discharge status: Home / Self Care | Payer: Medicare Other | Source: Ambulatory Visit | Attending: Cardiology | Admitting: Cardiology

## 2016-09-04 DIAGNOSIS — I10 Essential (primary) hypertension: Secondary | ICD-10-CM | POA: Diagnosis not present

## 2016-09-04 DIAGNOSIS — E785 Hyperlipidemia, unspecified: Secondary | ICD-10-CM | POA: Diagnosis not present

## 2016-09-04 DIAGNOSIS — I2510 Atherosclerotic heart disease of native coronary artery without angina pectoris: Secondary | ICD-10-CM | POA: Diagnosis not present

## 2016-09-04 DIAGNOSIS — Z9889 Other specified postprocedural states: Secondary | ICD-10-CM | POA: Diagnosis not present

## 2016-09-04 DIAGNOSIS — E119 Type 2 diabetes mellitus without complications: Secondary | ICD-10-CM | POA: Diagnosis not present

## 2016-09-04 DIAGNOSIS — I6523 Occlusion and stenosis of bilateral carotid arteries: Secondary | ICD-10-CM | POA: Diagnosis not present

## 2016-09-04 DIAGNOSIS — I739 Peripheral vascular disease, unspecified: Principal | ICD-10-CM

## 2016-09-04 DIAGNOSIS — Z95828 Presence of other vascular implants and grafts: Secondary | ICD-10-CM | POA: Insufficient documentation

## 2016-09-04 DIAGNOSIS — Z09 Encounter for follow-up examination after completed treatment for conditions other than malignant neoplasm: Secondary | ICD-10-CM | POA: Insufficient documentation

## 2016-09-04 DIAGNOSIS — I701 Atherosclerosis of renal artery: Secondary | ICD-10-CM

## 2016-09-04 DIAGNOSIS — I779 Disorder of arteries and arterioles, unspecified: Secondary | ICD-10-CM

## 2016-09-04 DIAGNOSIS — Z87891 Personal history of nicotine dependence: Secondary | ICD-10-CM | POA: Diagnosis not present

## 2016-09-10 ENCOUNTER — Other Ambulatory Visit: Payer: Self-pay | Admitting: Interventional Cardiology

## 2016-09-10 ENCOUNTER — Other Ambulatory Visit: Payer: Self-pay | Admitting: Cardiovascular Disease

## 2016-09-10 DIAGNOSIS — I779 Disorder of arteries and arterioles, unspecified: Secondary | ICD-10-CM

## 2016-09-10 DIAGNOSIS — I701 Atherosclerosis of renal artery: Secondary | ICD-10-CM

## 2016-09-10 DIAGNOSIS — I739 Peripheral vascular disease, unspecified: Principal | ICD-10-CM

## 2016-09-18 ENCOUNTER — Telehealth: Payer: Self-pay | Admitting: Cardiovascular Disease

## 2016-09-18 NOTE — Telephone Encounter (Signed)
New message     Pt is having acid reflux really bad on the protonix and her pcp wants to put her back on nexium

## 2016-09-18 NOTE — Telephone Encounter (Signed)
Best alternative is to change protonix of ranitidine 150mg  twice daily to avoid potential interactions with plavix (clopidogrel) , also recommend void irritants in diet.

## 2016-09-18 NOTE — Telephone Encounter (Signed)
Patient aware of recommendations and verbalized understanding

## 2016-09-18 NOTE — Telephone Encounter (Signed)
Returned call to patient-reports she is having bad acid reflux and her primary wants to try Nexium instead of Protonix.  States she was instructed in the hospital that she shouldn't take Nexium and wanted to make sure it wouldn't interfere with her stent.     Per discharge summary 2017: Some studies suggest Nexium interacts with Plavix. We changed your Nexium to Protonix for less chance of interaction. Please follow up with the doctor that typically prescribes this medicine for further refills.   Advised I would route to Dr. [name] and pharmacy for recommendations.

## 2016-10-16 ENCOUNTER — Other Ambulatory Visit: Payer: Self-pay | Admitting: Cardiology

## 2016-12-05 ENCOUNTER — Encounter: Payer: Self-pay | Admitting: Interventional Cardiology

## 2016-12-09 ENCOUNTER — Other Ambulatory Visit: Payer: Self-pay | Admitting: Surgery

## 2016-12-09 DIAGNOSIS — Z1231 Encounter for screening mammogram for malignant neoplasm of breast: Secondary | ICD-10-CM

## 2016-12-25 ENCOUNTER — Ambulatory Visit: Payer: Medicare Other | Admitting: Interventional Cardiology

## 2017-01-13 IMAGING — CT CT ABD-PELV W/O CM
2 of 4 series · 15 of 46 positions shown, 17 images · non-contrast
Comparison: CT of the abdomen pelvis from 08/20/2012, and renal
ultrasound performed 12/29/2014

CLINICAL DATA: Acute onset of left flank pain, vomiting and
constipation. Initial encounter.

EXAM:
CT ABDOMEN AND PELVIS WITHOUT CONTRAST
TECHNIQUE: Multidetector CT imaging of the abdomen and pelvis was performed
following the standard protocol without IV contrast.

[Series 2: abd/ pelvis 5.0 i30f 1 · axial · 0.75mm/px · z∈[-459,-49]mm · 12 of 90 slices shown, 14 images]
[im 4/90  soft-tissue]
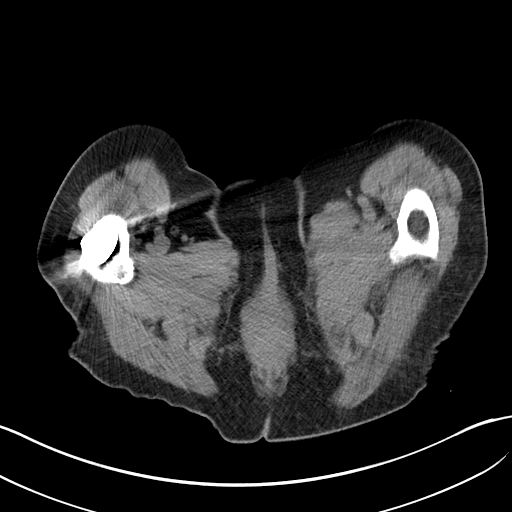
[im 4/90  bone]
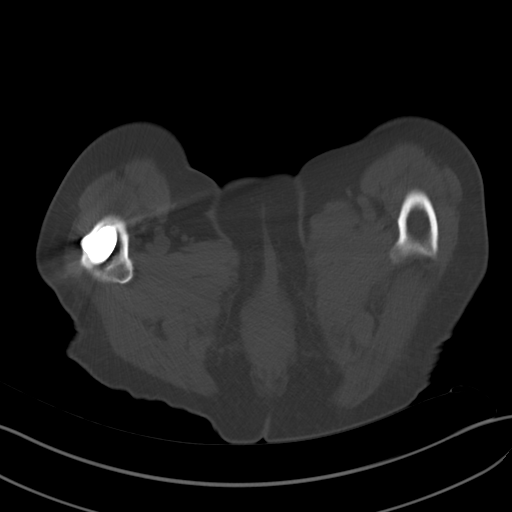
[im 12/90  soft-tissue]
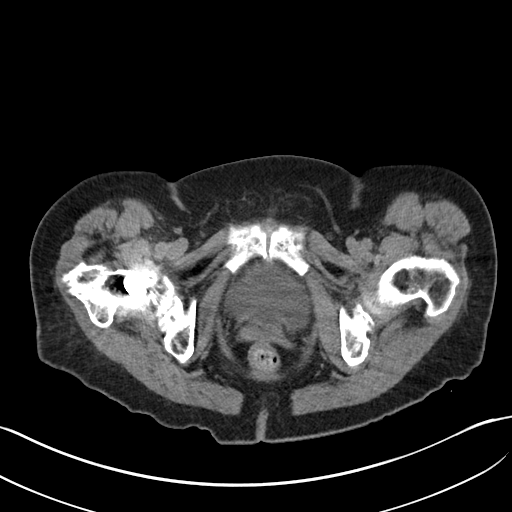
[im 20/90  soft-tissue]
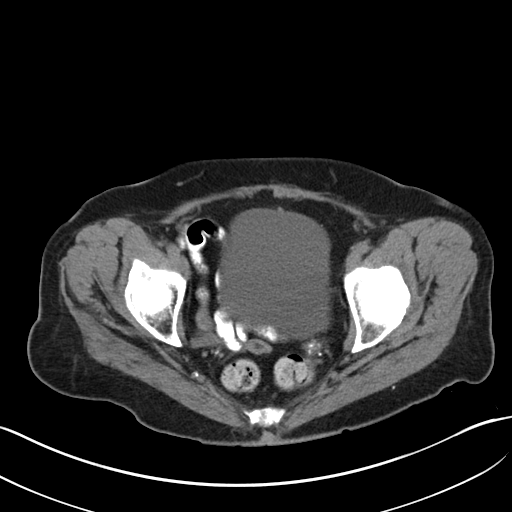
[im 28/90  soft-tissue]
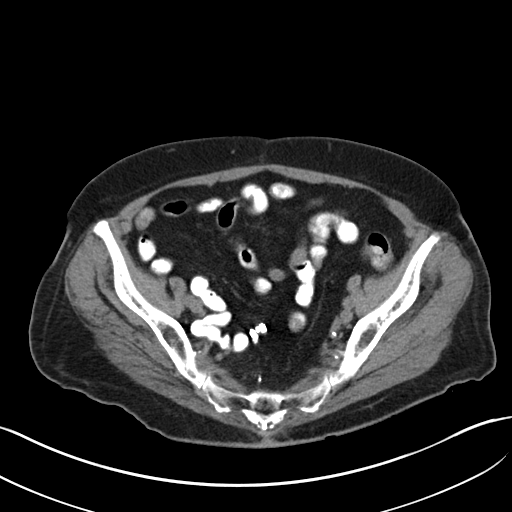
[im 35/90  soft-tissue]
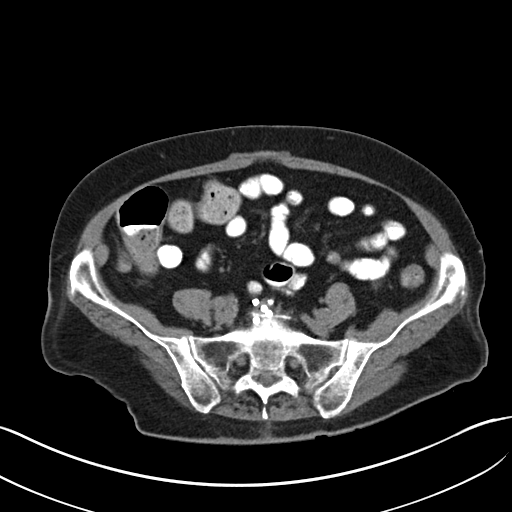
[im 43/90  soft-tissue]
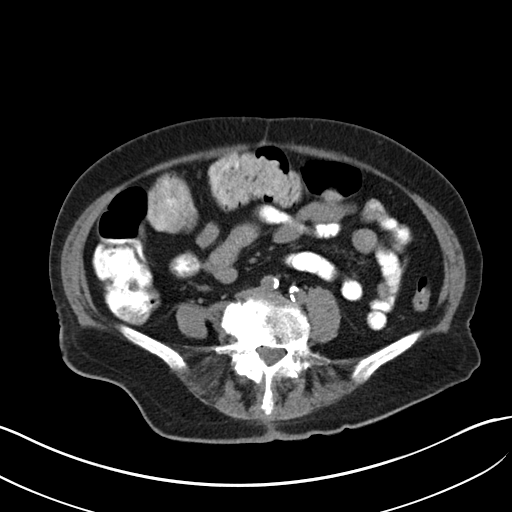
[im 47/90  soft-tissue]
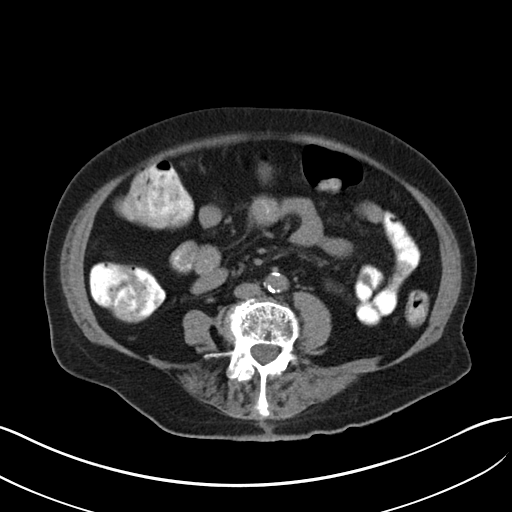
[im 55/90  soft-tissue]
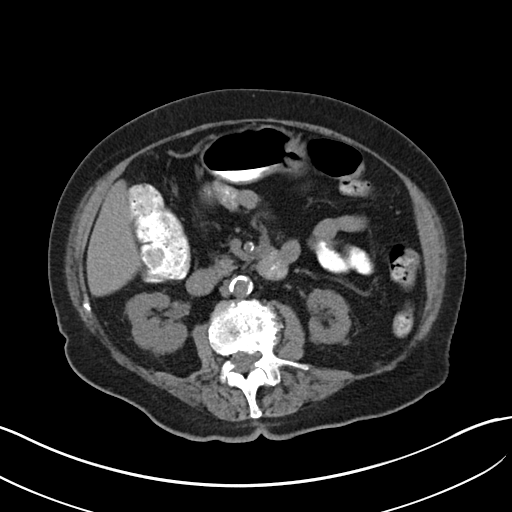
[im 62/90  soft-tissue]
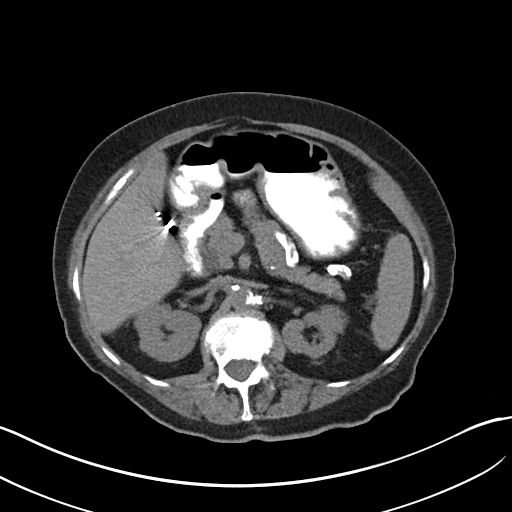
[im 62/90  bone]
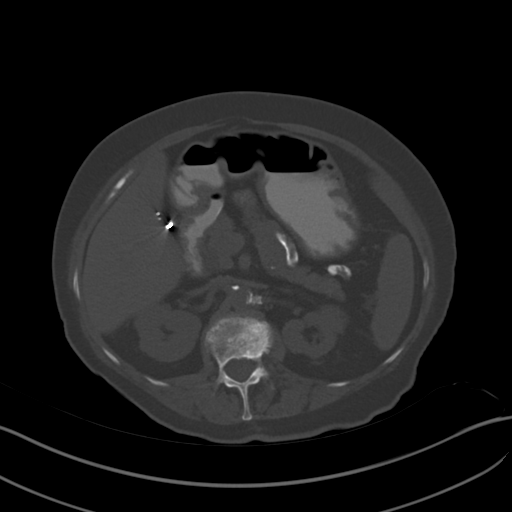
[im 70/90  soft-tissue]
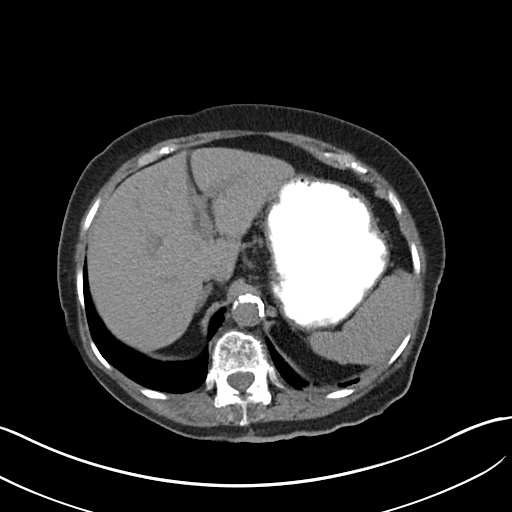
[im 78/90  soft-tissue]
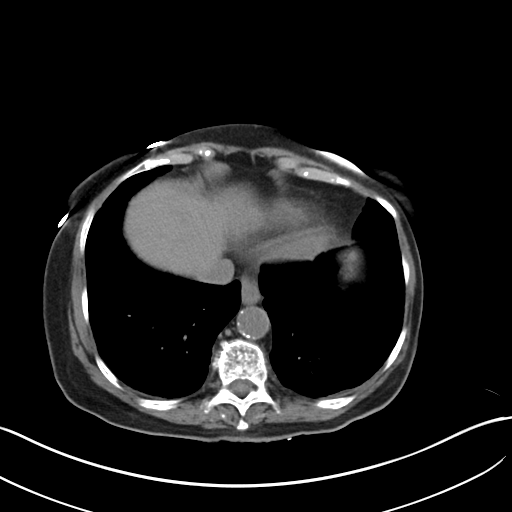
[im 86/90  soft-tissue]
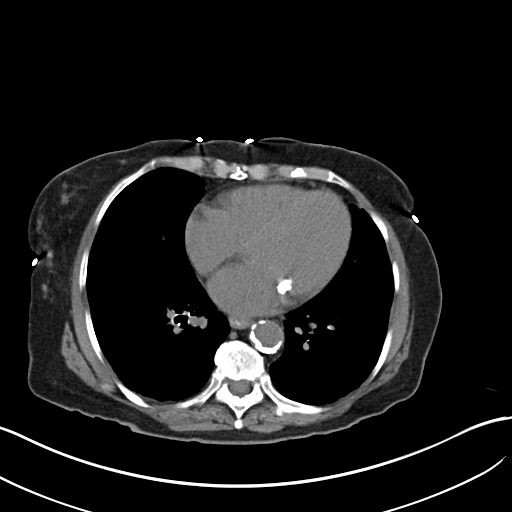

[Series 5: cor st · coronal · 0.72mm/px · 3 of 72 slices shown]
[im 24/72  soft-tissue]
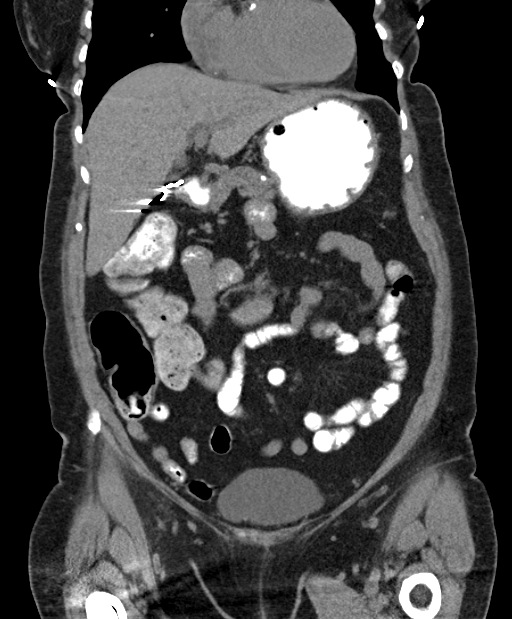
[im 32/72  soft-tissue]
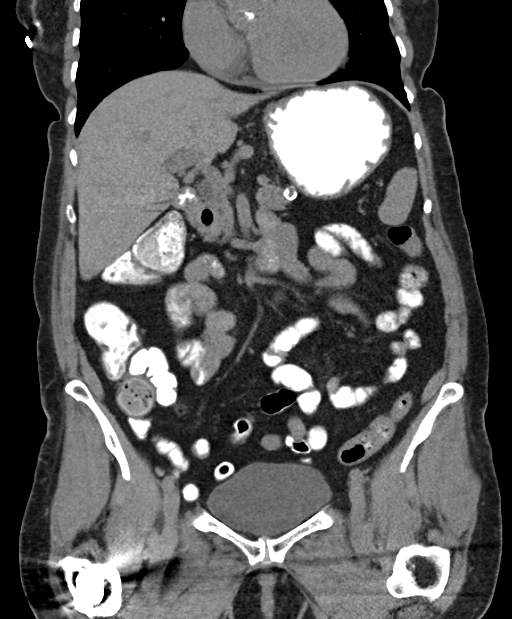
[im 40/72  soft-tissue]
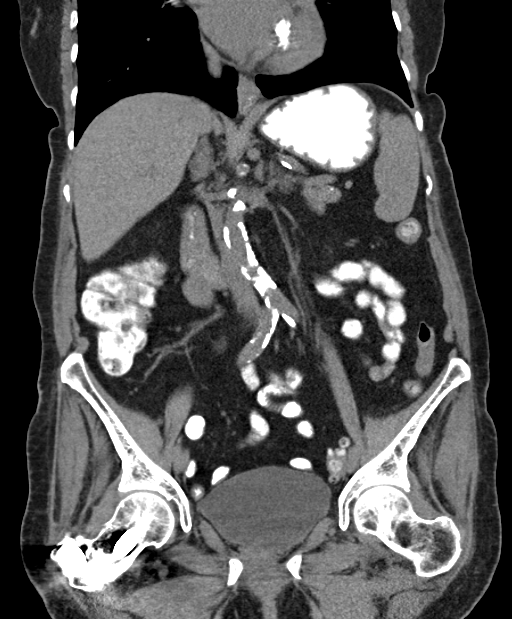

[15 of 46 positions shown; findings below may reference images not displayed]

FINDINGS: The visualized lung bases are clear. Calcification is noted at the
mitral and aortic valves. Scattered coronary artery calcifications
are seen.

The liver and spleen are unremarkable in appearance. The patient is
status post cholecystectomy, with clips noted at the gallbladder
fossa. The pancreas and adrenal glands are unremarkable.

Small bilateral renal cysts are seen. Nonspecific perinephric
stranding is noted bilaterally. Mild left renal atrophy is noted.
There is no evidence of hydronephrosis. No renal or ureteral stones
are identified.

No free fluid is identified. The small bowel is unremarkable in
appearance. A small duodenal diverticulum is noted at the pancreatic
head. The stomach is within normal limits. No acute vascular
abnormalities are seen. Scattered calcification is noted along the
abdominal aorta and its branches.

The appendix is not seen. There is no evidence for appendicitis.
Scattered diverticulosis is noted along the descending and sigmoid
colon, without evidence of diverticulitis.

The bladder is moderately distended and grossly unremarkable. The
patient is status post hysterectomy. No suspicious adnexal masses
are seen. No inguinal lymphadenopathy is seen.

No acute osseous abnormalities are identified. Visualized right
femoral hardware is grossly unremarkable in appearance. Multilevel
vacuum phenomenon is noted along the lower thoracic and lumbar
spine, with endplate sclerotic changes noted at multiple levels.
IMPRESSION: 1. No acute abnormality seen to explain the patient's symptoms.
2. Mild left renal atrophy noted.  Small bilateral renal cysts seen.
3. Calcification at the mitral and aortic valves. Scattered coronary
artery calcification noted.
4. Small duodenal diverticulum at the pancreatic head.
5. Scattered calcification along the abdominal aorta and its
branches.
6. Scattered diverticulosis along the descending and sigmoid colon,
without evidence of diverticulitis.
7. Degenerative change along the lower thoracic and lumbar spine.

## 2017-01-24 ENCOUNTER — Other Ambulatory Visit: Payer: Self-pay | Admitting: Physician Assistant

## 2017-02-09 IMAGING — CR DG CHEST 2V
2 series · 2 of 2 positions shown · non-contrast
Comparison: 12/21/2011 and 12/12/2004 and CT scan of the abdomen
dated 01/30/2015

CLINICAL DATA: Chest pain. Nausea and vomiting. Weakness. Neck
pain.

EXAM:
CHEST  2 VIEW

[chest lat]
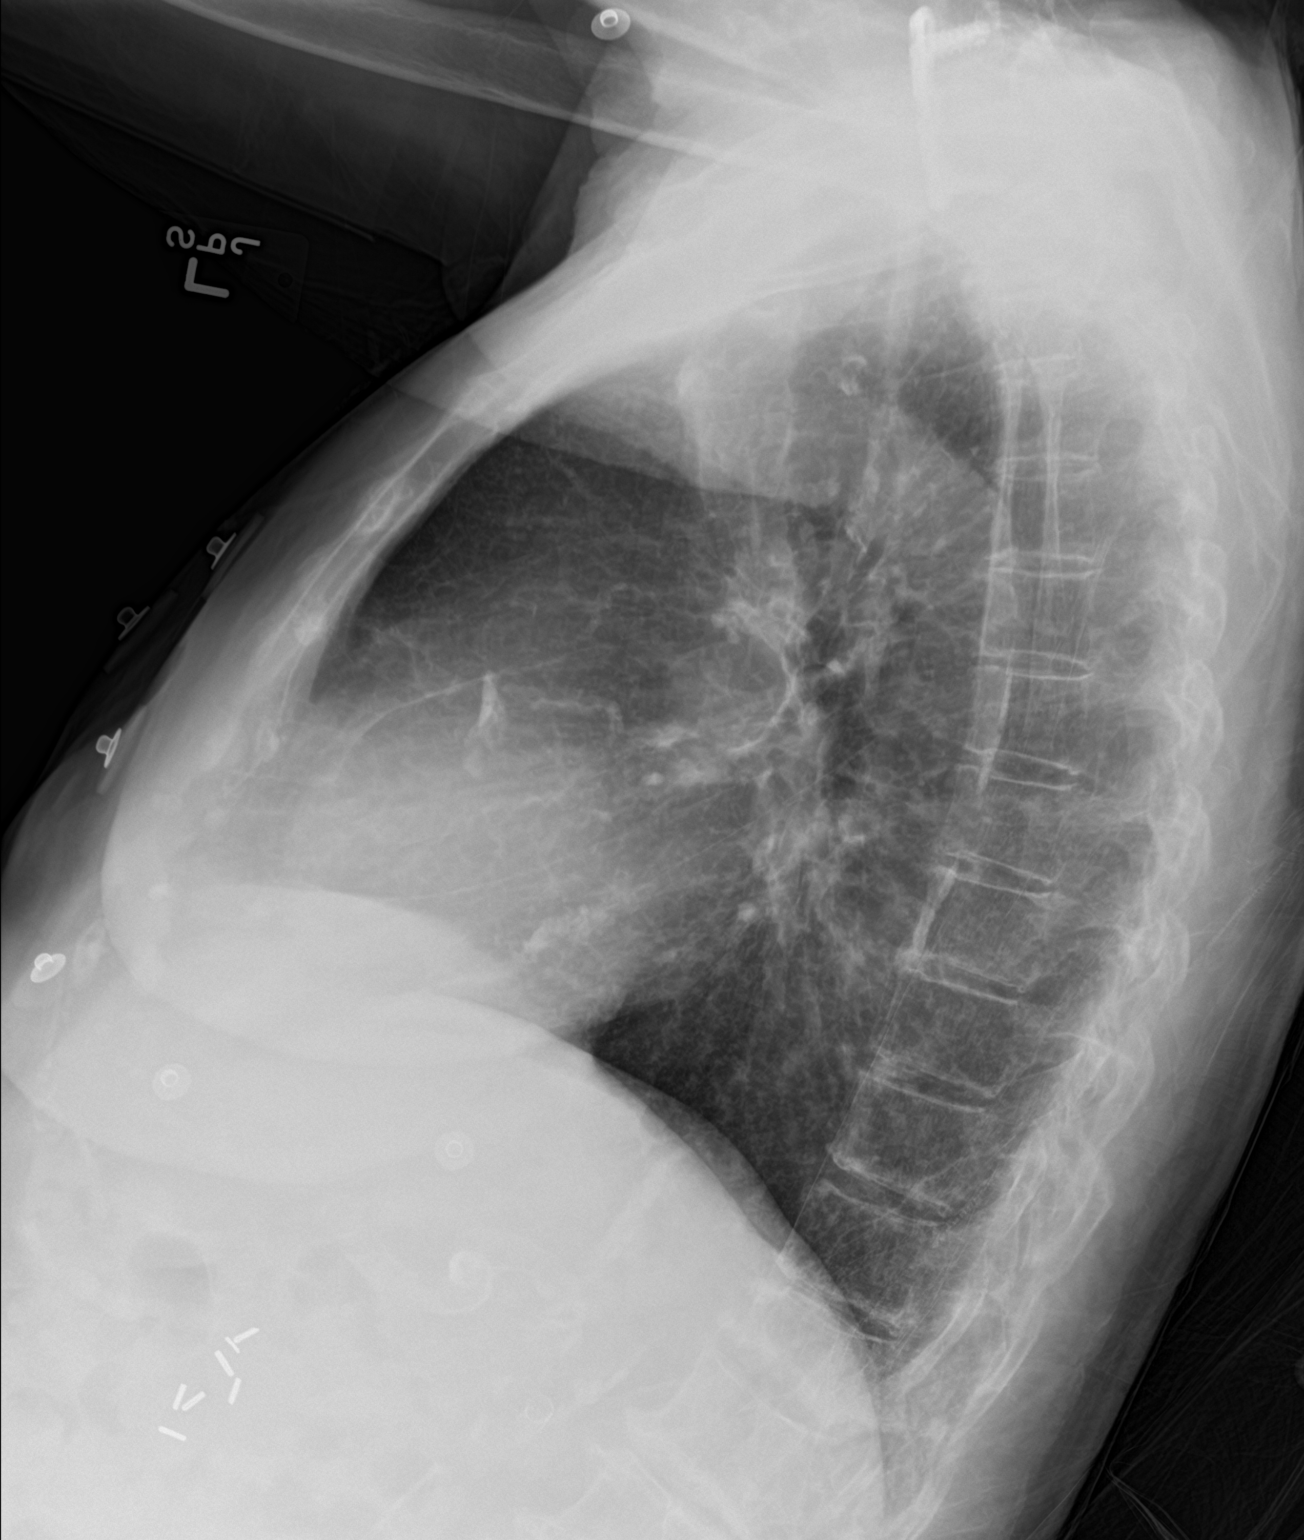

[chest ap]
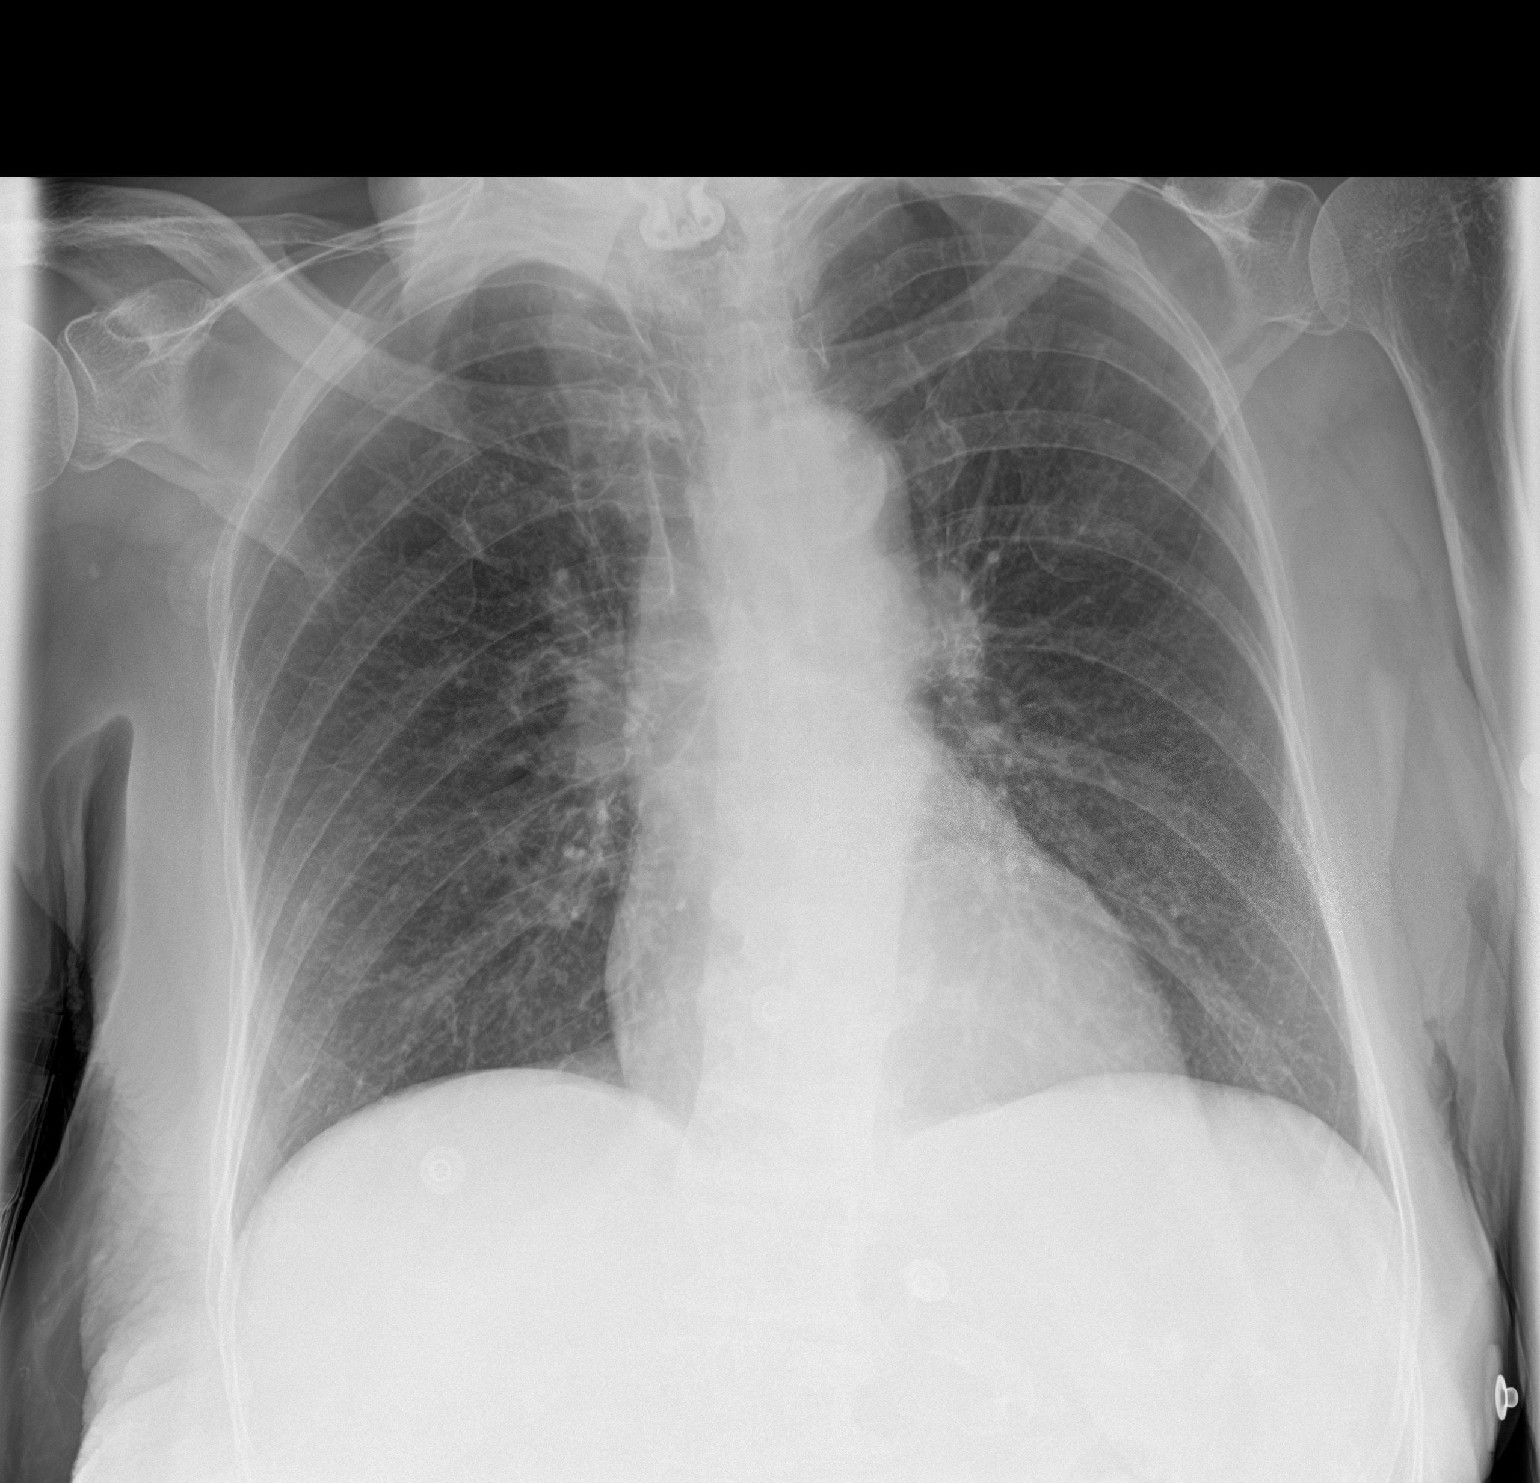

[2 of 2 positions shown; findings below may reference images not displayed]

FINDINGS: Heart size and pulmonary vascularity are normal and the lungs are
clear. The patient has developed a compression fracture of the
inferior aspect T12 since the prior CT scan 01/30/2015.

Calcification in the thoracic aorta.
IMPRESSION: New compression fracture of the inferior aspect of T12 since
01/30/2015. Heart and lungs appear normal.

Aortic atherosclerosis.

## 2017-02-10 ENCOUNTER — Ambulatory Visit (INDEPENDENT_AMBULATORY_CARE_PROVIDER_SITE_OTHER): Payer: Medicare Other | Admitting: Interventional Cardiology

## 2017-02-10 ENCOUNTER — Encounter: Payer: Self-pay | Admitting: Interventional Cardiology

## 2017-02-10 VITALS — BP 196/70 | HR 79 | Ht 61.0 in | Wt 116.6 lb

## 2017-02-10 DIAGNOSIS — I701 Atherosclerosis of renal artery: Secondary | ICD-10-CM

## 2017-02-10 DIAGNOSIS — I209 Angina pectoris, unspecified: Secondary | ICD-10-CM | POA: Diagnosis not present

## 2017-02-10 DIAGNOSIS — I6523 Occlusion and stenosis of bilateral carotid arteries: Secondary | ICD-10-CM | POA: Diagnosis not present

## 2017-02-10 DIAGNOSIS — I10 Essential (primary) hypertension: Secondary | ICD-10-CM

## 2017-02-10 DIAGNOSIS — I208 Other forms of angina pectoris: Secondary | ICD-10-CM

## 2017-02-10 NOTE — Progress Notes (Signed)
Cardiology Office Note    Date:  02/10/2017   ID:  [name], DOB [birth year], MRN [id number]  PCP:  Lavone Orn, MD  Cardiologist: Sinclair Grooms, MD   Chief Complaint  Patient presents with  . Follow-up    Elevated blood pressure  . Advice Only    Blood pressure    History of Present Illness:  [name] is a 83 y.o. female  coronary artery disease with known total occlusion of the right coronary, history of renal artery stenosis with left renal stent 2010-07-27 and recent angiogram suggesting total occlusion, severe and labile hypertension, mild aortic stenosis, hyperlipidemia, diabetes mellitus, and recent recurring syncopal episodes.  She is accompanied by her husband.  She has had a recent syncopal episodes, etiology of which is not been discovered.  She denies chest pain or palpitations prior to the episodes.  She has had 3-5 over the last 10 years.  She cannot remember many details.  She is having difficulty with memory.  She is here for annual checkup.  She brought her blood pressure log for evaluation.  Blood pressures are quite labile.  Generally the pressures are less than 160/90 mmHg.  Some are greater than 846 mmHg systolic.  It is difficult to know when these pressures are taken in relationship to medication.  She has a long list of medication intolerances.  Because of this, the medication regimen is essentially locked in without ability to add other agents.  Aggressive diuresis causes hyponatremia.  She has multiple other intolerances (please see extended list above).   Past Medical History:  Diagnosis Date  . Bradycardia    a. Coreg decreased due to HR low 40s on event monitor 27-Jul-2015 -> further decreased due to HR upper 30s in 01/2016.  [name] CAD (coronary artery disease)    a. RV infarct 2005/07/26 c/b high grade AV block with total occlusion of RCA, unable to treat with PCI. b. Nuc 10/2015: scar but no ischemia.  . Carotid artery disease ([name])   . Chronic anemia     . Chronic lower back pain   . CKD (chronic kidney disease), stage III ([place])   . Complication of anesthesia    "almost died when I had my breast reduction"  . Dyslipidemia   . GERD (gastroesophageal reflux disease)   . Heart murmur   . High cholesterol   . History of hiatal hernia   . HTN (hypertension)   . Hyperthyroidism   . Meniere disease    "for years" takes Antivert 3 times per day (01/31/2016)  . Myocardial infarction [facility]) 07/26/05   "almost died"  . Near syncope   . Osteoporosis   . Renal artery stenosis (H[facility])    a. left renal artery stent 07-27-10. b. s/p repeat left renal stenting 01/2016.  . Stroke [facility])    a. Prior infarcts seen on head CT.  [name] Type II diabetes mellitus ([place])    "controlled w/diet and exercise only" (01/31/2016)    Past Surgical History:  Procedure Laterality Date  . ANTERIOR CERVICAL DECOMP/DISCECTOMY FUSION  02/2002   Archie Endo 08/26/2010  . APPENDECTOMY    . BACK SURGERY    . BLADDER SUSPENSION  x3  . BREAST LUMPECTOMY  11/2007   breast needle-localized lumpectomy/notes 08/12/2012  . CARDIAC CATHETERIZATION  26-Jul-2005   "couldn't get stents in; I almost died'  . CATARACT EXTRACTION W/ INTRAOCULAR LENS  IMPLANT, BILATERAL Bilateral   . CATARACT EXTRACTION W/ INTRAOCULAR LENS  IMPLANT,  BILATERAL Bilateral   . CHOLECYSTECTOMY OPEN    . FRACTURE SURGERY    . HIP FRACTURE SURGERY Right 2011   "added rod in my leg and a ball"  . KYPHOPLASTY N/A 03/20/2015   Procedure: THORACIC TWELVE KYPHOPLASTY;  Surgeon: Jovita Gamma, MD;  Location: [place] NEURO ORS;  Service: Neurosurgery;  Laterality: N/A;  . KYPHOPLASTY N/A 04/23/2015   Procedure: KYPHOPLASTY - LUMBAR ONE;  Surgeon: Jovita Gamma, MD;  Location: [place] NEURO ORS;  Service: Neurosurgery;  Laterality: N/A;  L1 Kyphoplasty  . NECK HARDWARE REMOVAL  11/2002   "replaced screw"  . PERIPHERAL VASCULAR CATHETERIZATION Left 12/20/2014   Procedure: Renal Angiography;  Surgeon: Wellington Hampshire, MD;  Location: [place] CV  LAB;  Service: Cardiovascular;  Laterality: Left;  . PERIPHERAL VASCULAR CATHETERIZATION N/A 01/31/2016   Procedure: Renal Angiography;  Surgeon: Lorretta Harp, MD;  Location: [place] CV LAB;  Service: Cardiovascular;  Laterality: N/A;  . PERIPHERAL VASCULAR CATHETERIZATION N/A 01/31/2016   Procedure: Abdominal Aortogram;  Surgeon: Lorretta Harp, MD;  Location: [place] CV LAB;  Service: Cardiovascular;  Laterality: N/A;  . PERIPHERAL VASCULAR CATHETERIZATION  01/31/2016   Procedure: Peripheral Vascular Intervention;  Surgeon: Lorretta Harp, MD;  Location: [place] CV LAB;  Service: Cardiovascular;;  . REDUCTION MAMMAPLASTY Bilateral 1985  . renal arteriogram  12/2014   occluded previous Lt renal stent and patent rt renal artery.  [name] RENAL ARTERY STENT Left 2012  . TONSILLECTOMY    . TUBAL LIGATION    . VAGINAL HYSTERECTOMY  ~ 1995  . WRIST FRACTURE SURGERY Left 2012    Current Medications: Outpatient Medications Prior to Visit  Medication Sig Dispense Refill  . acetaminophen (TYLENOL) 500 MG tablet Take 1,000 mg by mouth 2 (two) times daily.     [name] aspirin 81 MG tablet Take 1 tablet (81 mg total) by mouth daily. 30 tablet 11  . azelastine (ASTELIN) 0.1 % nasal spray Place 1-2 sprays into both nostrils daily as needed. For allergies/congestion.    . betamethasone valerate lotion (VALISONE) 0.1 % Apply 1 application topically 2 (two) times daily as needed. For irritated skin.    . calcium carbonate (CALCIUM 600) 1500 (600 Ca) MG TABS tablet Take 1,200 mg by mouth 2 (two) times daily.     . cholecalciferol (VITAMIN D) 1000 UNITS tablet Take 2,000 Units by mouth daily.     . cloNIDine (CATAPRES) 0.1 MG tablet TAKE 1 TABLET BY MOUTH EVERY MORNING AND 2 TABLETS IN THE EVENING 270 tablet 1  . clopidogrel (PLAVIX) 75 MG tablet TAKE 1 TABLET BY MOUTH EVERY DAY 30 tablet 4  . Cod Liver Oil CAPS Take 1 capsule by mouth daily.    [name] denosumab (PROLIA) 60 MG/ML SOLN injection Inject  60 mg into the skin every 6 (six) months. Administer in upper arm, thigh, or abdomen    . docusate sodium (COLACE) 100 MG capsule Take 100-200 mg by mouth 2 (two) times daily. 100MG  in the morning and 200mg  in the evening    . fexofenadine (ALLEGRA) 180 MG tablet Take 180 mg by mouth daily.    . furosemide (LASIX) 40 MG tablet TAKE 1 TABLET (40 MG TOTAL) BY MOUTH DAILY. 30 tablet 8  . levocetirizine (XYZAL) 5 MG tablet Take 5 mg by mouth every evening.    . meclizine (ANTIVERT) 25 MG tablet Take 25 mg by mouth 3 (three) times daily.     . methimazole (TAPAZOLE) 5 MG tablet Take  5 mg by mouth daily.  11  . Multiple Vitamins-Minerals (PRESERVISION AREDS) CAPS Take 1 capsule by mouth once a week.     . nitroGLYCERIN (NITROSTAT) 0.4 MG SL tablet Place 0.4 mg under the tongue every 5 (five) minutes as needed for chest pain (3 doses MAX).    [name] olmesartan (BENICAR) 20 MG tablet Take 1 tablet (20 mg total) by mouth daily. 60 tablet 6  . pantoprazole (PROTONIX) 40 MG tablet TAKE 1 TABLET BY MOUTH TWICE A DAY BEFORE A MEAL 60 tablet 11  . simvastatin (ZOCOR) 20 MG tablet Take 20 mg by mouth 3 (three) times a week. Monday, Wednesday and Friday    . vitamin E 400 UNIT capsule Take 2,000 Units by mouth daily.      No facility-administered medications prior to visit.      Allergies:   Fentanyl; Fish allergy; Morphine and related; Singulair [montelukast]; Actonel [risedronate sodium]; Dilaudid [hydromorphone hcl]; Fosamax [alendronate sodium]; Hydralazine; Lipitor [atorvastatin]; Lisinopril; Micardis [telmisartan]; Septra [sulfamethoxazole-trimethoprim]; Tekturna [aliskiren]; Tricor [fenofibrate]; Imdur [isosorbide dinitrate]; Amlodipine; Avapro [irbesartan]; Cardio complete [nutritional supplements]; Cardizem [diltiazem hcl]; Ciprofloxacin; Codeine; Cyclobenzaprine; Forteo [teriparatide (recombinant)]; Inapsine [droperidol]; Ivp dye [iodinated diagnostic agents]; Shellfish allergy; and Tussionex pennkinetic er  [hydrocod polst-cpm polst er]   Social History   Social History  . Marital status: Married    Spouse name: N/A  . Number of children: N/A  . Years of education: N/A   Social History Main Topics  . Smoking status: Former Smoker    Packs/day: 1.00    Years: 30.00    Types: Cigarettes  . Smokeless tobacco: Never Used     Comment: "quit smoking in 1989"  . Alcohol use No  . Drug use: No  . Sexual activity: Not Asked   Other Topics Concern  . None   Social History Narrative  . None     Family History:  The patient's family history includes Alzheimer's disease in her sister; Congestive Heart Failure in her father; Dementia in her brother; Diabetes type II in her brother, mother, sister, and sister; Healthy in her sister and sister; Heart attack in her mother and sister; Heart disease in her brother, mother, and sister; Kidney failure in her sister and sister; Other in her brother; Pulmonary disease in her sister; Pulmonary embolism in her father; Stroke in her brother and brother.   ROS:   Please see the history of present illness.    Hearing loss, dizziness, difficulty with balance, and irregular heartbeat. All other systems reviewed and are negative.   PHYSICAL EXAM:   VS:  BP (!) 196/70 (BP Location: Right Arm)   Pulse 79   Ht 5\' 1"  (1.549 m)   Wt 116 lb 9.6 oz (52.9 kg)   BMI 22.03 kg/m    GEN: Well nourished, well developed, in no acute distress  HEENT: normal  Neck: no JVD, carotid bruits, or masses Cardiac: RRR; no murmurs, rubs, or gallops,no edema  Respiratory:  clear to auscultation bilaterally, normal work of breathing GI: soft, nontender, nondistended, + BS MS: no deformity or atrophy  Skin: warm and dry, no rash Neuro:  Alert and Oriented x 3, Strength and sensation are intact Psych: euthymic mood, full affect  Wt Readings from Last 3 Encounters:  02/10/17 116 lb 9.6 oz (52.9 kg)  07/08/16 114 lb 12.8 oz (52.1 kg)  06/18/16 115 lb (52.2 kg)       Studies/Labs Reviewed:   EKG:  EKG not performed  Recent Labs: 06/14/2016:  Platelets 218 06/15/2016: Hemoglobin 10.2; TSH 3.513 07/04/2016: BUN 22; Creatinine, Ser 1.20; Potassium 4.2; Sodium 131   Lipid Panel    Component Value Date/Time   CHOL  05/10/2008 0635    177        ATP III CLASSIFICATION:  <200     mg/dL   Desirable  200-239  mg/dL   Borderline High  >=240    mg/dL   High          TRIG 546 (H) 05/10/2008 0635   HDL 31 (L) 05/10/2008 0635   CHOLHDL 5.7 05/10/2008 0635   VLDL UNABLE TO CALCULATE IF TRIGLYCERIDE OVER 400 mg/dL 05/10/2008 0635   LDLCALC  05/10/2008 0635    UNABLE TO CALCULATE IF TRIGLYCERIDE OVER 400 mg/dL        Total Cholesterol/HDL:CHD Risk Coronary Heart Disease Risk Table                     Men   Women  1/2 Average Risk   3.4   3.3  Average Risk       5.0   4.4  2 X Average Risk   9.6   7.1  3 X Average Risk  23.4   11.0        Use the calculated Patient Ratio above and the CHD Risk Table to determine the patient's CHD Risk.        ATP III CLASSIFICATION (LDL):  <100     mg/dL   Optimal  100-129  mg/dL   Near or Above                    Optimal  130-159  mg/dL   Borderline  160-189  mg/dL   High  >190     mg/dL   Very High    Additional studies/ records that were reviewed today include:  No new data    ASSESSMENT:    1. Angina at rest [facility])   2. Essential hypertension   3. Left renal artery stenosis (HCC)   4. Bilateral carotid artery stenosis      PLAN:  In order of problems listed above:  1. Minimal angina in quite stable from that regard. 2. Blood pressures were repeated after the patient rested in the room for a while.  Sitting blood pressure 120/60 mmHg in the right arm.  Standing blood pressure 145/60 mmHg in the right arm, heart rate 75 bpm.  This pressure was recorded after standing for 2 minutes.  Heart rate increased to 88 bpm.  No change in current blood pressure regimen.  Adhere to low-salt diet.  We are doing  everything we can for the blood pressure.  I have asked her to stop measuring the pressure so frequently as she clearly gets nervous about the blood pressure levels. 3. Not addressed 4. Not addressed  Plan clinical follow-up as needed.  Her major issues at this time are becoming difficulty with memory and frailty.  I will see and return as needed.    Medication Adjustments/Labs and Tests Ordered: Current medicines are reviewed at length with the patient today.  Concerns regarding medicines are outlined above.  Medication changes, Labs and Tests ordered today are listed in the Patient Instructions below. Patient Instructions  Medication Instructions:  Your physician recommends that you continue on your current medications as directed. Please refer to the Current Medication list given to you today.  Labwork: None ordered   Testing/Procedures: None ordered  Follow-Up: Your physician recommends that you schedule a follow-up appointment as needed with Dr. Tamala Julian.   Any Other Special Instructions Will Be Listed Below (If Applicable).     If you need a refill on your cardiac medications before your next appointment, please call your pharmacy.     Signed, Sinclair Grooms, MD  02/10/2017 5:38 PM    [facility] [place], [place], [place]  [address] Phone: [phone]; Fax: [phone]

## 2017-02-10 NOTE — Patient Instructions (Signed)
Medication Instructions:  Your physician recommends that you continue on your current medications as directed. Please refer to the Current Medication list given to you today.   Labwork: None ordered  Testing/Procedures: None ordered  Follow-Up: Your physician recommends that you schedule a follow-up appointment as needed with Dr. Smith.   Any Other Special Instructions Will Be Listed Below (If Applicable).     If you need a refill on your cardiac medications before your next appointment, please call your pharmacy.   

## 2017-05-18 ENCOUNTER — Ambulatory Visit
Admission: RE | Admit: 2017-05-18 | Discharge: 2017-05-18 | Disposition: A | Discharge status: Home / Self Care | Payer: Medicare Other | Source: Ambulatory Visit | Attending: Surgery | Admitting: Surgery

## 2017-05-18 DIAGNOSIS — Z1231 Encounter for screening mammogram for malignant neoplasm of breast: Secondary | ICD-10-CM

## 2017-05-30 ENCOUNTER — Other Ambulatory Visit: Payer: Self-pay | Admitting: Interventional Cardiology

## 2017-06-20 ENCOUNTER — Other Ambulatory Visit: Payer: Self-pay | Admitting: Physician Assistant

## 2017-08-04 ENCOUNTER — Other Ambulatory Visit (HOSPITAL_COMMUNITY): Payer: Self-pay | Admitting: Internal Medicine

## 2017-08-04 DIAGNOSIS — I639 Cerebral infarction, unspecified: Secondary | ICD-10-CM

## 2017-08-04 DIAGNOSIS — R269 Unspecified abnormalities of gait and mobility: Secondary | ICD-10-CM

## 2017-08-04 DIAGNOSIS — I69322 Dysarthria following cerebral infarction: Secondary | ICD-10-CM

## 2017-08-04 DIAGNOSIS — R471 Dysarthria and anarthria: Secondary | ICD-10-CM

## 2017-08-05 ENCOUNTER — Other Ambulatory Visit (HOSPITAL_COMMUNITY): Payer: Self-pay | Admitting: Internal Medicine

## 2017-08-05 ENCOUNTER — Ambulatory Visit (HOSPITAL_COMMUNITY)
Admission: RE | Admit: 2017-08-05 | Discharge: 2017-08-05 | Disposition: A | Discharge status: Home / Self Care | Payer: Medicare Other | Source: Ambulatory Visit | Attending: Internal Medicine | Admitting: Internal Medicine

## 2017-08-05 DIAGNOSIS — M4802 Spinal stenosis, cervical region: Secondary | ICD-10-CM | POA: Diagnosis not present

## 2017-08-05 DIAGNOSIS — R269 Unspecified abnormalities of gait and mobility: Secondary | ICD-10-CM

## 2017-08-05 DIAGNOSIS — G319 Degenerative disease of nervous system, unspecified: Secondary | ICD-10-CM | POA: Diagnosis not present

## 2017-09-24 ENCOUNTER — Ambulatory Visit (HOSPITAL_COMMUNITY)
Admission: RE | Admit: 2017-09-24 | Discharge: 2017-09-24 | Disposition: A | Discharge status: Home / Self Care | Payer: Medicare Other | Source: Ambulatory Visit | Attending: Cardiology | Admitting: Cardiology

## 2017-09-24 DIAGNOSIS — I701 Atherosclerosis of renal artery: Secondary | ICD-10-CM | POA: Insufficient documentation

## 2017-09-24 DIAGNOSIS — E785 Hyperlipidemia, unspecified: Secondary | ICD-10-CM | POA: Insufficient documentation

## 2017-09-24 DIAGNOSIS — E119 Type 2 diabetes mellitus without complications: Secondary | ICD-10-CM | POA: Insufficient documentation

## 2017-09-24 DIAGNOSIS — Z87891 Personal history of nicotine dependence: Secondary | ICD-10-CM | POA: Insufficient documentation

## 2017-09-24 DIAGNOSIS — I2510 Atherosclerotic heart disease of native coronary artery without angina pectoris: Secondary | ICD-10-CM | POA: Diagnosis not present

## 2017-09-24 DIAGNOSIS — I779 Disorder of arteries and arterioles, unspecified: Secondary | ICD-10-CM | POA: Diagnosis present

## 2017-09-24 DIAGNOSIS — I739 Peripheral vascular disease, unspecified: Secondary | ICD-10-CM

## 2017-09-24 DIAGNOSIS — I10 Essential (primary) hypertension: Secondary | ICD-10-CM | POA: Insufficient documentation

## 2017-09-24 DIAGNOSIS — I6523 Occlusion and stenosis of bilateral carotid arteries: Secondary | ICD-10-CM | POA: Insufficient documentation

## 2017-09-29 ENCOUNTER — Other Ambulatory Visit: Payer: Self-pay | Admitting: *Deleted

## 2017-09-29 DIAGNOSIS — I779 Disorder of arteries and arterioles, unspecified: Secondary | ICD-10-CM

## 2017-09-29 DIAGNOSIS — I701 Atherosclerosis of renal artery: Secondary | ICD-10-CM

## 2017-09-29 DIAGNOSIS — I739 Peripheral vascular disease, unspecified: Principal | ICD-10-CM

## 2017-09-29 NOTE — Progress Notes (Signed)
vas 

## 2017-10-26 ENCOUNTER — Other Ambulatory Visit: Payer: Self-pay

## 2017-10-26 ENCOUNTER — Emergency Department (HOSPITAL_BASED_OUTPATIENT_CLINIC_OR_DEPARTMENT_OTHER)
Admission: EM | Admit: 2017-10-26 | Discharge: 2017-10-26 | Disposition: A | Discharge status: Home / Self Care | Payer: Medicare Other | Attending: Emergency Medicine | Admitting: Emergency Medicine

## 2017-10-26 ENCOUNTER — Encounter (HOSPITAL_BASED_OUTPATIENT_CLINIC_OR_DEPARTMENT_OTHER): Payer: Self-pay | Admitting: *Deleted

## 2017-10-26 DIAGNOSIS — I2510 Atherosclerotic heart disease of native coronary artery without angina pectoris: Secondary | ICD-10-CM | POA: Diagnosis not present

## 2017-10-26 DIAGNOSIS — Z7982 Long term (current) use of aspirin: Secondary | ICD-10-CM | POA: Diagnosis not present

## 2017-10-26 DIAGNOSIS — R0989 Other specified symptoms and signs involving the circulatory and respiratory systems: Secondary | ICD-10-CM

## 2017-10-26 DIAGNOSIS — I129 Hypertensive chronic kidney disease with stage 1 through stage 4 chronic kidney disease, or unspecified chronic kidney disease: Secondary | ICD-10-CM | POA: Insufficient documentation

## 2017-10-26 DIAGNOSIS — Z7902 Long term (current) use of antithrombotics/antiplatelets: Secondary | ICD-10-CM | POA: Diagnosis not present

## 2017-10-26 DIAGNOSIS — R6889 Other general symptoms and signs: Secondary | ICD-10-CM | POA: Diagnosis present

## 2017-10-26 DIAGNOSIS — E1122 Type 2 diabetes mellitus with diabetic chronic kidney disease: Secondary | ICD-10-CM | POA: Diagnosis not present

## 2017-10-26 DIAGNOSIS — Z87891 Personal history of nicotine dependence: Secondary | ICD-10-CM | POA: Insufficient documentation

## 2017-10-26 DIAGNOSIS — N183 Chronic kidney disease, stage 3 (moderate): Secondary | ICD-10-CM | POA: Diagnosis not present

## 2017-10-26 DIAGNOSIS — Z79899 Other long term (current) drug therapy: Secondary | ICD-10-CM | POA: Insufficient documentation

## 2017-10-26 NOTE — ED Provider Notes (Signed)
[facility] EMERGENCY DEPARTMENT Provider Note   CSN: [id number] Arrival date & time: 10/26/17  July 19, 1124     History   Chief Complaint Chief Complaint  Patient presents with  . Hypertension    HPI [name] is a 84 y.o. female.  84 year old female with extensive past medical history including CAD, CKD, labile hypertension, renal artery stenosis, T2DM, CVA who presents with hypertension.  Early this morning the patient woke up with a funny feeling in her head that she says she usually gets when her blood pressure is elevated.  She checked her blood pressure and her systolic BP was 333.  She called her office and was instructed to go to the ER for evaluation.  She took her morning medications at home and since then has noted that her blood pressure has improved.  She denies any headache, vision changes, chest pain, shortness of breath, focal weakness or numbness.  In the past she has had a hard time controlling her blood pressure due to adverse reactions to certain medications.  Her PCP follows her for her hypertension.  She reports she is compliant with all of her medicines.  The history is provided by the patient.  Hypertension     Past Medical History:  Diagnosis Date  . Bradycardia    a. Coreg decreased due to HR low 40s on event monitor Jul 20, 2015 -> further decreased due to HR upper 30s in 01/2016.  [name] CAD (coronary artery disease)    a. RV infarct Jul 19, 2005 c/b high grade AV block with total occlusion of RCA, unable to treat with PCI. b. Nuc 10/2015: scar but no ischemia.  . Carotid artery disease ([facility])   . Chronic anemia   . Chronic lower back pain   . CKD (chronic kidney disease), stage III ([place])   . Complication of anesthesia    "almost died when I had my breast reduction"  . Dyslipidemia   . GERD (gastroesophageal reflux disease)   . Heart murmur   . High cholesterol   . History of hiatal hernia   . HTN (hypertension)   . Hyperthyroidism   . Meniere disease    "for years" takes Antivert 3 times per day (01/31/2016)  . Myocardial infarction [facility]) 07/19/2005   "almost died"  . Near syncope   . Osteoporosis   . Renal artery stenosis (HCC)    a. left renal artery stent 2010/07/20. b. s/p repeat left renal stenting 01/2016.  . Stroke [facility])    a. Prior infarcts seen on head CT.  [name] Type II diabetes mellitus ([place])    "controlled w/diet and exercise only" (01/31/2016)    Patient Active Problem List   Diagnosis Date Noted  . Angina at rest [facility]) 06/15/2016  . Hypertensive urgency 06/15/2016  . Carotid artery disease ([place]) 02/27/2016  . Anemia 02/01/2016  . Bradycardia 02/01/2016  . Left renal artery stenosis ([place]) 01/31/2016  . Compression fracture of L1 lumbar vertebra (HCC) 04/23/2015  . T12 compression fracture ([place]) 03/20/2015  . Hyperthyroidism 03/11/2015  . Thyroid nodule 03/11/2015  . Syncope 03/08/2015  . Hypokalemia 03/08/2015  . Weakness 02/26/2015  . Compression fracture of T12 vertebra ([place]) 02/26/2015  . CKD (chronic kidney disease), stage III ([place]) 02/26/2015  . Multiple allergies 02/26/2015  . Abdominal pain 02/04/2015  . Hyponatremia 02/04/2015  . Left flank pain 02/03/2015  . LLQ abdominal pain 08/21/2012  . Indigestion 12/21/2011  . DM (diabetes mellitus) ([place]) 12/21/2011  . CAD (coronary artery disease) 12/21/2011  .  HTN (hypertension) 12/21/2011  . Hyperlipidemia 12/21/2011  . GERD (gastroesophageal reflux disease) 12/21/2011    Past Surgical History:  Procedure Laterality Date  . ANTERIOR CERVICAL DECOMP/DISCECTOMY FUSION  02/2002   Archie Endo 08/26/2010  . APPENDECTOMY    . BACK SURGERY    . BLADDER SUSPENSION  x3  . BREAST LUMPECTOMY  11/2007   breast needle-localized lumpectomy/notes 08/12/2012  . CARDIAC CATHETERIZATION  2007   "couldn't get stents in; I almost died'  . CATARACT EXTRACTION W/ INTRAOCULAR LENS  IMPLANT, BILATERAL Bilateral   . CATARACT EXTRACTION W/ INTRAOCULAR LENS  IMPLANT, BILATERAL Bilateral   .  CHOLECYSTECTOMY OPEN    . FRACTURE SURGERY    . HIP FRACTURE SURGERY Right 2011   "added rod in my leg and a ball"  . KYPHOPLASTY N/A 03/20/2015   Procedure: THORACIC TWELVE KYPHOPLASTY;  Surgeon: Jovita Gamma, MD;  Location: [facility] NEURO ORS;  Service: Neurosurgery;  Laterality: N/A;  . KYPHOPLASTY N/A 04/23/2015   Procedure: KYPHOPLASTY - LUMBAR ONE;  Surgeon: Jovita Gamma, MD;  Location: [facility] NEURO ORS;  Service: Neurosurgery;  Laterality: N/A;  L1 Kyphoplasty  . NECK HARDWARE REMOVAL  11/2002   "replaced screw"  . PERIPHERAL VASCULAR CATHETERIZATION Left 12/20/2014   Procedure: Renal Angiography;  Surgeon: Wellington Hampshire, MD;  Location: [facility] CV LAB;  Service: Cardiovascular;  Laterality: Left;  . PERIPHERAL VASCULAR CATHETERIZATION N/A 01/31/2016   Procedure: Renal Angiography;  Surgeon: Lorretta Harp, MD;  Location: [facility] CV LAB;  Service: Cardiovascular;  Laterality: N/A;  . PERIPHERAL VASCULAR CATHETERIZATION N/A 01/31/2016   Procedure: Abdominal Aortogram;  Surgeon: Lorretta Harp, MD;  Location: [facility] CV LAB;  Service: Cardiovascular;  Laterality: N/A;  . PERIPHERAL VASCULAR CATHETERIZATION  01/31/2016   Procedure: Peripheral Vascular Intervention;  Surgeon: Lorretta Harp, MD;  Location: [facility] CV LAB;  Service: Cardiovascular;;  . REDUCTION MAMMAPLASTY Bilateral 1985  . renal arteriogram  12/2014   occluded previous Lt renal stent and patent rt renal artery.  [name] RENAL ARTERY STENT Left 2012  . TONSILLECTOMY    . TUBAL LIGATION    . VAGINAL HYSTERECTOMY  ~ 1995  . WRIST FRACTURE SURGERY Left 2012     OB History   None      Home Medications    Prior to Admission medications   Medication Sig Start Date End Date Taking? Authorizing Provider  acetaminophen (TYLENOL) 500 MG tablet Take 1,000 mg by mouth 2 (two) times daily.     [provider]  aspirin 81 MG tablet Take 1 tablet (81 mg total) by mouth daily. 02/01/16   Dunn, Nedra Hai, PA-C   azelastine (ASTELIN) 0.1 % nasal spray Place 1-2 sprays into both nostrils daily as needed. For allergies/congestion. 12/14/15   [provider]  betamethasone valerate lotion (VALISONE) 0.1 % Apply 1 application topically 2 (two) times daily as needed. For irritated skin. 12/06/15   [provider]  calcium carbonate (CALCIUM 600) 1500 (600 Ca) MG TABS tablet Take 1,200 mg by mouth 2 (two) times daily.     [provider]  cholecalciferol (VITAMIN D) 1000 UNITS tablet Take 2,000 Units by mouth daily.     [provider]  cloNIDine (CATAPRES) 0.1 MG tablet TAKE 1 TABLET BY MOUTH EVERY MORNING AND 2 TABLETS IN THE EVENING 10/17/16   Lorretta Harp, MD  clopidogrel (PLAVIX) 75 MG tablet TAKE 1 TABLET BY MOUTH EVERY DAY 06/22/17   Charlie Pitter, PA-C  Cod  Liver Oil CAPS Take 1 capsule by mouth daily.    [provider]  denosumab (PROLIA) 60 MG/ML SOLN injection Inject 60 mg into the skin every 6 (six) months. Administer in upper arm, thigh, or abdomen    [provider]  docusate sodium (COLACE) 100 MG capsule Take 100-200 mg by mouth 2 (two) times daily. 100MG  in the morning and 200mg  in the evening    [provider]  fexofenadine (ALLEGRA) 180 MG tablet Take 180 mg by mouth daily.    [provider]  furosemide (LASIX) 40 MG tablet TAKE 1 TABLET (40 MG TOTAL) BY MOUTH DAILY. 06/02/17   Belva Crome, MD  levocetirizine (XYZAL) 5 MG tablet Take 5 mg by mouth every evening.    [provider]  meclizine (ANTIVERT) 25 MG tablet Take 25 mg by mouth 3 (three) times daily.     [provider]  methimazole (TAPAZOLE) 5 MG tablet Take 5 mg by mouth daily. 08/27/15   [provider]  Multiple Vitamins-Minerals (PRESERVISION AREDS) CAPS Take 1 capsule by mouth once a week.     [provider]  nitroGLYCERIN (NITROSTAT) 0.4 MG SL tablet Place 0.4 mg under the tongue every 5 (five) minutes as needed for  chest pain (3 doses MAX).    [provider]  olmesartan (BENICAR) 20 MG tablet Take 1 tablet (20 mg total) by mouth daily. 06/18/16   Camnitz, Will Hassell Done, MD  pantoprazole (PROTONIX) 40 MG tablet TAKE 1 TABLET BY MOUTH TWICE A DAY BEFORE A MEAL 03/24/16   Dunn, Dayna N, PA-C  simvastatin (ZOCOR) 20 MG tablet Take 20 mg by mouth 3 (three) times a week. Monday, Wednesday and Friday    [provider]  vitamin E 400 UNIT capsule Take 2,000 Units by mouth daily.     [provider]    Family History Family History  Problem Relation Age of Onset  . Diabetes type II Mother   . Heart attack Mother   . Heart disease Mother   . Congestive Heart Failure Father   . Pulmonary embolism Father   . Alzheimer's disease Sister   . Heart disease Sister   . Heart disease Brother   . Healthy Sister   . Healthy Sister   . Stroke Brother   . Dementia Brother        VASCULAR  . Stroke Brother   . Diabetes type II Brother   . Other Brother        KILLED IN PLANE CRASH  . Pulmonary disease Sister   . Heart attack Sister   . Kidney failure Sister   . Diabetes type II Sister   . Diabetes type II Sister   . Kidney failure Sister     Social History Social History   Tobacco Use  . Smoking status: Former Smoker    Packs/day: 1.00    Years: 30.00    Pack years: 30.00    Types: Cigarettes  . Smokeless tobacco: Never Used  . Tobacco comment: "quit smoking in 1989"  Substance Use Topics  . Alcohol use: No    Alcohol/week: 0.0 oz  . Drug use: No     Allergies   Fentanyl; Fish allergy; Morphine and related; Singulair [montelukast]; Actonel [risedronate sodium]; Dilaudid [hydromorphone hcl]; Fosamax [alendronate sodium]; Hydralazine; Lipitor [atorvastatin]; Lisinopril; Micardis [telmisartan]; Septra [sulfamethoxazole-trimethoprim]; Tekturna [aliskiren]; Tricor [fenofibrate]; Imdur [isosorbide dinitrate]; Amlodipine; Avapro [irbesartan]; Cardio complete [nutritional  supplements]; Cardizem [diltiazem hcl]; Ciprofloxacin; Codeine; Cyclobenzaprine; Forteo [  teriparatide (recombinant)]; Inapsine [droperidol]; Ivp dye [iodinated diagnostic agents]; Shellfish allergy; and Tussionex pennkinetic er [employer] polst-cpm polst er]   Review of Systems Review of Systems   Physical Exam Updated Vital Signs BP (!) 180/67 (BP Location: Left Arm)   Pulse 77   Temp 98.4 F (36.9 C) (Oral)   Resp 18   Ht 5' 1.25" (1.556 m)   Wt 49.9 kg (110 lb)   SpO2 100%   BMI 20.61 kg/m   Physical Exam  Constitutional: She is oriented to person, place, and time. She appears well-developed and well-nourished. No distress.  HENT:  Head: Normocephalic and atraumatic.  Moist mucous membranes  Eyes: Pupils are equal, round, and reactive to light. Conjunctivae are normal.  Neck: Neck supple.  Cardiovascular: Normal rate and regular rhythm.  Murmur heard. Pulmonary/Chest: Effort normal and breath sounds normal.  Abdominal: Soft. Bowel sounds are normal. She exhibits no distension. There is no tenderness.  Musculoskeletal: She exhibits no edema.  Neurological: She is alert and oriented to person, place, and time. Coordination normal.  Fluent speech  Skin: Skin is warm and dry.  Psychiatric: She has a normal mood and affect. Judgment normal.  Nursing note and vitals reviewed.    ED Treatments / Results  Labs (all labs ordered are listed, but only abnormal results are displayed) Labs Reviewed - No data to display  EKG None  Radiology No results found.  Procedures Procedures (including critical care time)  Medications Ordered in ED Medications - No data to display   Initial Impression / Assessment and Plan / ED Course  I have reviewed the triage vital signs and the nursing notes.     Pt well appearing on comfortable on exam. BP was improved without intervention. I noted previous documentation from cardiology stating her BP is difficult to treat due to  intolerance of other meds. I explained that the fact that she was close to being due for her morning clonidine dose may explain why her pressure was so elevated.  She is completely asymptomatic here and no signs/symptoms to suggest hypertensive emergency.  Have instructed to continue her home medications and follow closely with her PCP.  Extensively reviewed return precautions and she voiced understanding.  Final Clinical Impressions(s) / ED Diagnoses   Final diagnoses:  Labile hypertension    ED Discharge Orders    None       Maryalyce Sanjuan, Wenda Overland, MD 10/26/17 1318

## 2017-10-26 NOTE — ED Triage Notes (Signed)
Hypertension. She woke with a funny feeling in her head this am. She took her BP and it was elevated. She took her BP medication and thinks it may have helped. She just started on BP medication a month ago.

## 2017-12-28 DIAGNOSIS — M19042 Primary osteoarthritis, left hand: Secondary | ICD-10-CM | POA: Insufficient documentation

## 2017-12-28 DIAGNOSIS — R52 Pain, unspecified: Secondary | ICD-10-CM | POA: Insufficient documentation

## 2018-01-04 ENCOUNTER — Other Ambulatory Visit: Payer: Self-pay | Admitting: Orthopedic Surgery

## 2018-01-04 DIAGNOSIS — R52 Pain, unspecified: Secondary | ICD-10-CM

## 2018-01-04 DIAGNOSIS — M19042 Primary osteoarthritis, left hand: Secondary | ICD-10-CM

## 2018-01-07 ENCOUNTER — Ambulatory Visit
Admission: RE | Admit: 2018-01-07 | Discharge: 2018-01-07 | Disposition: A | Discharge status: Home / Self Care | Payer: Medicare Other | Source: Ambulatory Visit | Attending: Orthopedic Surgery | Admitting: Orthopedic Surgery

## 2018-01-07 DIAGNOSIS — M19042 Primary osteoarthritis, left hand: Secondary | ICD-10-CM

## 2018-01-07 DIAGNOSIS — R52 Pain, unspecified: Secondary | ICD-10-CM

## 2018-01-14 ENCOUNTER — Emergency Department (HOSPITAL_BASED_OUTPATIENT_CLINIC_OR_DEPARTMENT_OTHER): Payer: Medicare Other

## 2018-01-14 ENCOUNTER — Other Ambulatory Visit: Payer: Self-pay

## 2018-01-14 ENCOUNTER — Encounter (HOSPITAL_BASED_OUTPATIENT_CLINIC_OR_DEPARTMENT_OTHER): Payer: Self-pay | Admitting: Emergency Medicine

## 2018-01-14 DIAGNOSIS — I129 Hypertensive chronic kidney disease with stage 1 through stage 4 chronic kidney disease, or unspecified chronic kidney disease: Secondary | ICD-10-CM | POA: Diagnosis not present

## 2018-01-14 DIAGNOSIS — Z7982 Long term (current) use of aspirin: Secondary | ICD-10-CM | POA: Insufficient documentation

## 2018-01-14 DIAGNOSIS — Z79899 Other long term (current) drug therapy: Secondary | ICD-10-CM | POA: Insufficient documentation

## 2018-01-14 DIAGNOSIS — E1122 Type 2 diabetes mellitus with diabetic chronic kidney disease: Secondary | ICD-10-CM | POA: Diagnosis not present

## 2018-01-14 DIAGNOSIS — I252 Old myocardial infarction: Secondary | ICD-10-CM | POA: Insufficient documentation

## 2018-01-14 DIAGNOSIS — I2510 Atherosclerotic heart disease of native coronary artery without angina pectoris: Secondary | ICD-10-CM | POA: Insufficient documentation

## 2018-01-14 DIAGNOSIS — Z87891 Personal history of nicotine dependence: Secondary | ICD-10-CM | POA: Insufficient documentation

## 2018-01-14 DIAGNOSIS — M25571 Pain in right ankle and joints of right foot: Secondary | ICD-10-CM | POA: Diagnosis not present

## 2018-01-14 DIAGNOSIS — M25471 Effusion, right ankle: Secondary | ICD-10-CM | POA: Insufficient documentation

## 2018-01-14 DIAGNOSIS — Z7902 Long term (current) use of antithrombotics/antiplatelets: Secondary | ICD-10-CM | POA: Diagnosis not present

## 2018-01-14 DIAGNOSIS — N183 Chronic kidney disease, stage 3 (moderate): Secondary | ICD-10-CM | POA: Insufficient documentation

## 2018-01-14 NOTE — ED Triage Notes (Signed)
Pt states she is having 10/10 right foot pain for the past few days getting worse today, pt states she is not able to put any weight on her leg. Denies any fall or injury.

## 2018-01-15 ENCOUNTER — Emergency Department (HOSPITAL_BASED_OUTPATIENT_CLINIC_OR_DEPARTMENT_OTHER)
Admission: EM | Admit: 2018-01-15 | Discharge: 2018-01-15 | Disposition: A | Discharge status: Home / Self Care | Payer: Medicare Other | Attending: Emergency Medicine | Admitting: Emergency Medicine

## 2018-01-15 DIAGNOSIS — M25571 Pain in right ankle and joints of right foot: Secondary | ICD-10-CM | POA: Diagnosis not present

## 2018-01-15 DIAGNOSIS — M25471 Effusion, right ankle: Secondary | ICD-10-CM

## 2018-01-15 DIAGNOSIS — R609 Edema, unspecified: Secondary | ICD-10-CM

## 2018-01-15 LAB — CBG MONITORING, ED: Glucose-Capillary: 161 mg/dL — ABNORMAL HIGH (ref 70–99)

## 2018-01-15 MED ORDER — PREDNISONE 20 MG PO TABS: 40.0000 mg | ORAL_TABLET | Freq: Every day | ORAL | 0 refills | Status: DC

## 2018-01-15 MED ORDER — HYDROCODONE-ACETAMINOPHEN 5-325 MG PO TABS
1.0000 | ORAL_TABLET | Freq: Once | ORAL | Status: AC
  Administered 2018-01-15: 1 via ORAL
  Filled 2018-01-15: qty 1

## 2018-01-15 MED ORDER — PREDNISONE 20 MG PO TABS
40.0000 mg | ORAL_TABLET | Freq: Once | ORAL | Status: AC
  Administered 2018-01-15: 40 mg via ORAL
  Filled 2018-01-15: qty 2

## 2018-01-15 MED ORDER — HYDROCODONE-ACETAMINOPHEN 5-325 MG PO TABS: 1.0000 | ORAL_TABLET | ORAL | 0 refills | Status: DC | PRN

## 2018-01-15 NOTE — Discharge Instructions (Addendum)
I suspect that the pain in your foot and ankle is from gout.  You have been given a prescription for prednisone, which should help with get better.  However, it can make your blood sugar go higher.  Please watch your blood sugar carefully and, if it gets too high, contact your primary care provider or come back to the emergency department.  Apply ice as needed, and keep your foot elevated when possible.

## 2018-01-15 NOTE — ED Provider Notes (Signed)
[facility] EMERGENCY DEPARTMENT Provider Note   CSN: [id number] Arrival date & time: 01/14/18  08/04/29     History   Chief Complaint Chief Complaint  Patient presents with  . Foot Pain    HPI [name] is a 84 y.o. female.  The history is provided by the patient.  She has history of coronary artery disease, chronic kidney disease, hypertension, hyperlipidemia, diet-controlled diabetes and comes in with pain in her right foot and ankle for the last 5 days.  She denies any trauma.  Pain is worse with attempting to ambulate.  She tried applying ice and heat but both actually made the pain worse.  She has noted some swelling in her foot and ankle.  She has made an appointment with a foot doctor, but that appointment is not for another 2 weeks.  She has not had similar problems before.  Past Medical History:  Diagnosis Date  . Bradycardia    a. Coreg decreased due to HR low 40s on event monitor Aug 05, 2015 -> further decreased due to HR upper 30s in 01/2016.  [name] CAD (coronary artery disease)    a. RV infarct August 04, 2005 c/b high grade AV block with total occlusion of RCA, unable to treat with PCI. b. Nuc 10/2015: scar but no ischemia.  . Carotid artery disease ([place])   . Chronic anemia   . Chronic lower back pain   . CKD (chronic kidney disease), stage III ([place])   . Complication of anesthesia    "almost died when I had my breast reduction"  . Dyslipidemia   . GERD (gastroesophageal reflux disease)   . Heart murmur   . High cholesterol   . History of hiatal hernia   . HTN (hypertension)   . Hyperthyroidism   . Meniere disease    "for years" takes Antivert 3 times per day (01/31/2016)  . Myocardial infarction [facility]) 08-04-2005   "almost died"  . Near syncope   . Osteoporosis   . Renal artery stenosis (H[facility])    a. left renal artery stent 08/05/10. b. s/p repeat left renal stenting 01/2016.  . Stroke [facility])    a. Prior infarcts seen on head CT.  [name] Type II diabetes mellitus ([place])    "controlled w/diet and exercise only" (01/31/2016)    Patient Active Problem List   Diagnosis Date Noted  . Angina at rest [facility]) 06/15/2016  . Hypertensive urgency 06/15/2016  . Carotid artery disease ([place]) 02/27/2016  . Anemia 02/01/2016  . Bradycardia 02/01/2016  . Left renal artery stenosis ([place]) 01/31/2016  . Compression fracture of L1 lumbar vertebra (H[facility]) 04/23/2015  . T12 compression fracture ([place]) 03/20/2015  . Hyperthyroidism 03/11/2015  . Thyroid nodule 03/11/2015  . Syncope 03/08/2015  . Hypokalemia 03/08/2015  . Weakness 02/26/2015  . Compression fracture of T12 vertebra ([place]) 02/26/2015  . CKD (chronic kidney disease), stage III ([place]) 02/26/2015  . Multiple allergies 02/26/2015  . Abdominal pain 02/04/2015  . Hyponatremia 02/04/2015  . Left flank pain 02/03/2015  . LLQ abdominal pain 08/21/2012  . Indigestion 12/21/2011  . DM (diabetes mellitus) ([place]) 12/21/2011  . CAD (coronary artery disease) 12/21/2011  . HTN (hypertension) 12/21/2011  . Hyperlipidemia 12/21/2011  . GERD (gastroesophageal reflux disease) 12/21/2011    Past Surgical History:  Procedure Laterality Date  . ANTERIOR CERVICAL DECOMP/DISCECTOMY FUSION  02/2002   Archie Endo 08/26/2010  . APPENDECTOMY    . BACK SURGERY    . BLADDER SUSPENSION  x3  . BREAST LUMPECTOMY  11/2007   breast needle-localized lumpectomy/notes 08/12/2012  . CARDIAC CATHETERIZATION  2007   "couldn't get stents in; I almost died'  . CATARACT EXTRACTION W/ INTRAOCULAR LENS  IMPLANT, BILATERAL Bilateral   . CATARACT EXTRACTION W/ INTRAOCULAR LENS  IMPLANT, BILATERAL Bilateral   . CHOLECYSTECTOMY OPEN    . FRACTURE SURGERY    . HIP FRACTURE SURGERY Right 2011   "added rod in my leg and a ball"  . KYPHOPLASTY N/A 03/20/2015   Procedure: THORACIC TWELVE KYPHOPLASTY;  Surgeon: Jovita Gamma, MD;  Location: [facility] NEURO ORS;  Service: Neurosurgery;  Laterality: N/A;  . KYPHOPLASTY N/A 04/23/2015   Procedure: KYPHOPLASTY - LUMBAR ONE;   Surgeon: Jovita Gamma, MD;  Location: [place] NEURO ORS;  Service: Neurosurgery;  Laterality: N/A;  L1 Kyphoplasty  . NECK HARDWARE REMOVAL  11/2002   "replaced screw"  . PERIPHERAL VASCULAR CATHETERIZATION Left 12/20/2014   Procedure: Renal Angiography;  Surgeon: Wellington Hampshire, MD;  Location: [facility] CV LAB;  Service: Cardiovascular;  Laterality: Left;  . PERIPHERAL VASCULAR CATHETERIZATION N/A 01/31/2016   Procedure: Renal Angiography;  Surgeon: Lorretta Harp, MD;  Location: [facility] [place] CV LAB;  Service: Cardiovascular;  Laterality: N/A;  . PERIPHERAL VASCULAR CATHETERIZATION N/A 01/31/2016   Procedure: Abdominal Aortogram;  Surgeon: Lorretta Harp, MD;  Location: [place] CV LAB;  Service: Cardiovascular;  Laterality: N/A;  . PERIPHERAL VASCULAR CATHETERIZATION  01/31/2016   Procedure: Peripheral Vascular Intervention;  Surgeon: Lorretta Harp, MD;  Location: [place] CV LAB;  Service: Cardiovascular;;  . REDUCTION MAMMAPLASTY Bilateral 1985  . renal arteriogram  12/2014   occluded previous Lt renal stent and patent rt renal artery.  [name] RENAL ARTERY STENT Left 2012  . TONSILLECTOMY    . TUBAL LIGATION    . VAGINAL HYSTERECTOMY  ~ 1995  . WRIST FRACTURE SURGERY Left 2012     OB History   None      Home Medications    Prior to Admission medications   Medication Sig Start Date End Date Taking? Authorizing Provider  acetaminophen (TYLENOL) 500 MG tablet Take 1,000 mg by mouth 2 (two) times daily.     [provider]  aspirin 81 MG tablet Take 1 tablet (81 mg total) by mouth daily. 02/01/16   Dunn, Nedra Hai, PA-C  azelastine (ASTELIN) 0.1 % nasal spray Place 1-2 sprays into both nostrils daily as needed. For allergies/congestion. 12/14/15   [provider]  betamethasone valerate lotion (VALISONE) 0.1 % Apply 1 application topically 2 (two) times daily as needed. For irritated skin. 12/06/15   [provider]  calcium carbonate (CALCIUM 600) 1500  (600 Ca) MG TABS tablet Take 1,200 mg by mouth 2 (two) times daily.     [provider]  cholecalciferol (VITAMIN D) 1000 UNITS tablet Take 2,000 Units by mouth daily.     [provider]  cloNIDine (CATAPRES) 0.1 MG tablet TAKE 1 TABLET BY MOUTH EVERY MORNING AND 2 TABLETS IN THE EVENING 10/17/16   Lorretta Harp, MD  clopidogrel (PLAVIX) 75 MG tablet TAKE 1 TABLET BY MOUTH EVERY DAY 06/22/17   Dunn, Nedra Hai, PA-C  Cod Liver Oil CAPS Take 1 capsule by mouth daily.    [provider]  denosumab (PROLIA) 60 MG/ML SOLN injection Inject 60 mg into the skin every 6 (six) months. Administer in upper arm, thigh, or abdomen    [provider]  docusate sodium (COLACE) 100 MG capsule Take 100-200 mg by mouth 2 (  two) times daily. 100MG  in the morning and 200mg  in the evening    [provider]  fexofenadine (ALLEGRA) 180 MG tablet Take 180 mg by mouth daily.    [provider]  furosemide (LASIX) 40 MG tablet TAKE 1 TABLET (40 MG TOTAL) BY MOUTH DAILY. 06/02/17   Belva Crome, MD  levocetirizine (XYZAL) 5 MG tablet Take 5 mg by mouth every evening.    [provider]  meclizine (ANTIVERT) 25 MG tablet Take 25 mg by mouth 3 (three) times daily.     [provider]  methimazole (TAPAZOLE) 5 MG tablet Take 5 mg by mouth daily. 08/27/15   [provider]  Multiple Vitamins-Minerals (PRESERVISION AREDS) CAPS Take 1 capsule by mouth once a week.     [provider]  nitroGLYCERIN (NITROSTAT) 0.4 MG SL tablet Place 0.4 mg under the tongue every 5 (five) minutes as needed for chest pain (3 doses MAX).    [provider]  olmesartan (BENICAR) 20 MG tablet Take 1 tablet (20 mg total) by mouth daily. 06/18/16   Camnitz, Will Hassell Done, MD  pantoprazole (PROTONIX) 40 MG tablet TAKE 1 TABLET BY MOUTH TWICE A DAY BEFORE A MEAL 03/24/16   Dunn, Dayna N, PA-C  simvastatin (ZOCOR) 20 MG tablet Take 20 mg by mouth 3 (three) times a  week. Monday, Wednesday and Friday    [provider]  vitamin E 400 UNIT capsule Take 2,000 Units by mouth daily.     [provider]    Family History Family History  Problem Relation Age of Onset  . Diabetes type II Mother   . Heart attack Mother   . Heart disease Mother   . Congestive Heart Failure Father   . Pulmonary embolism Father   . Alzheimer's disease Sister   . Heart disease Sister   . Heart disease Brother   . Healthy Sister   . Healthy Sister   . Stroke Brother   . Dementia Brother        VASCULAR  . Stroke Brother   . Diabetes type II Brother   . Other Brother        KILLED IN PLANE CRASH  . Pulmonary disease Sister   . Heart attack Sister   . Kidney failure Sister   . Diabetes type II Sister   . Diabetes type II Sister   . Kidney failure Sister     Social History Social History   Tobacco Use  . Smoking status: Former Smoker    Packs/day: 1.00    Years: 30.00    Pack years: 30.00    Types: Cigarettes  . Smokeless tobacco: Never Used  . Tobacco comment: "quit smoking in 1989"  Substance Use Topics  . Alcohol use: No    Alcohol/week: 0.0 standard drinks  . Drug use: No     Allergies   Fentanyl; Fish allergy; Morphine and related; Singulair [montelukast]; Actonel [risedronate sodium]; Dilaudid [hydromorphone hcl]; Fosamax [alendronate sodium]; Hydralazine; Lipitor [atorvastatin]; Lisinopril; Micardis [telmisartan]; Septra [sulfamethoxazole-trimethoprim]; Tekturna [aliskiren]; Tricor [fenofibrate]; Imdur [isosorbide dinitrate]; Amlodipine; Avapro [irbesartan]; Cardio complete [nutritional supplements]; Cardizem [diltiazem hcl]; Ciprofloxacin; Codeine; Cyclobenzaprine; Forteo [teriparatide (recombinant)]; Inapsine [droperidol]; Ivp dye [iodinated diagnostic agents]; Shellfish allergy; and Tussionex pennkinetic er [hydrocod polst-cpm polst er]   Review of Systems Review of Systems  All other systems reviewed and are  negative.    Physical Exam Updated Vital Signs BP (!) 192/61 (BP Location: Right Arm)   Pulse (!) 53  Temp 98 F (36.7 C) (Oral)   Resp 16   Ht 5\' 1"  (1.549 m)   Wt 49 kg   SpO2 97%   BMI 20.41 kg/m   Physical Exam  Nursing note and vitals reviewed.  84 year old female, resting comfortably and in no acute distress. Vital signs are significant for elevated blood pressure and slow heart rate. Oxygen saturation is 97%, which is normal. Head is normocephalic and atraumatic. PERRLA, EOMI. Oropharynx is clear. Neck is nontender and supple without adenopathy or JVD. Back is nontender and there is no CVA tenderness. Lungs are clear without rales, wheezes, or rhonchi. Chest is nontender. Heart has regular rate and rhythm with 2/6 systolic ejection murmur heard along left sternal border and at the apex. Abdomen is soft, flat, nontender without masses or hepatosplenomegaly and peristalsis is normoactive. Extremities have trace edema which is slightly greater on the right, full range of motion is present.  There appears to be an effusion of the right ankle with warmth and tenderness fairly well localized to the area just anterior to the lateral malleolus. Skin is warm and dry without rash. Neurologic: Mental status is normal, cranial nerves are intact, there are no motor or sensory deficits.  ED Treatments / Results  Labs (all labs ordered are listed, but only abnormal results are displayed) Labs Reviewed  CBG MONITORING, ED - Abnormal; Notable for the following components:      Result Value   Glucose-Capillary 161 (*)    All other components within normal limits   Radiology Dg Foot Complete Right  Result Date: 01/14/2018 CLINICAL DATA:  Right foot pain, unable to bear weight. Pain for 1 week, worse today. EXAM: RIGHT FOOT COMPLETE - 3+ VIEW COMPARISON:  None. FINDINGS: There is no evidence of fracture or dislocation. The bones are under mineralized. Only minimal degenerative change  in the digits, expected for age. No periosteal reaction or bony destructive change. There are vascular calcifications. Soft tissues are unremarkable. IMPRESSION: No acute findings or explanation for right foot pain. Electronically Signed   By: Keith Rake M.D.   On: 01/14/2018 22:39    Procedures Procedures   Medications Ordered in ED Medications  HYDROcodone-acetaminophen (NORCO/VICODIN) 5-325 MG per tablet 1 tablet (1 tablet Oral Given 01/15/18 0223)  predniSONE (DELTASONE) tablet 40 mg (40 mg Oral Given 01/15/18 0223)     Initial Impression / Assessment and Plan / ED Course  I have reviewed the triage vital signs and the nursing notes.  Pertinent labs & imaging results that were available during my care of the patient were reviewed by me and considered in my medical decision making (see chart for details).  Right foot and ankle pain.  She had been sent for x-rays which showed no acute process.  I suspect that she has gout in her ankle.  CBG was obtained, and is 161.  Old records are reviewed, and I see no relevant past visits.  She does have history of chronic kidney disease, so NSAIDs are contraindicated.  I feel the best treatment option is moderate dose prednisone.  She is discharged with prescription for 4-day course of prednisone, plus an initial dose in the ED.  Also given prescription for small number of hydrocodone-acetaminophen tablets (she is allergic to prednisone, but has taken hydrocodone in the past without any reactions).  Recommended follow-up with PCP in 4days.  Final Clinical Impressions(s) / ED Diagnoses   Final diagnoses:  Pain and swelling of right ankle  Peripheral edema  ED Discharge Orders         Ordered    predniSONE (DELTASONE) 20 MG tablet  Daily,   Status:  Discontinued     01/15/18 0221    HYDROcodone-acetaminophen (NORCO) 5-325 MG tablet  Every 4 hours PRN,   Status:  Discontinued     01/15/18 0221    HYDROcodone-acetaminophen (NORCO) 5-325 MG  tablet  Every 4 hours PRN     01/15/18 0226    predniSONE (DELTASONE) 20 MG tablet  Daily     01/15/18 7366           Delora Fuel, MD 81/59/47 0230

## 2018-01-18 ENCOUNTER — Other Ambulatory Visit: Payer: Self-pay | Admitting: Interventional Cardiology

## 2018-01-19 ENCOUNTER — Ambulatory Visit: Payer: Medicare Other | Admitting: Podiatry

## 2018-01-28 ENCOUNTER — Ambulatory Visit: Payer: Medicare Other | Admitting: Podiatry

## 2018-03-18 ENCOUNTER — Other Ambulatory Visit: Payer: Self-pay | Admitting: Interventional Cardiology

## 2018-03-18 MED ORDER — CLOPIDOGREL BISULFATE 75 MG PO TABS: 75.0000 mg | ORAL_TABLET | Freq: Every day | ORAL | 0 refills | Status: DC

## 2018-04-15 ENCOUNTER — Other Ambulatory Visit: Payer: Self-pay | Admitting: Surgery

## 2018-04-15 DIAGNOSIS — Z1231 Encounter for screening mammogram for malignant neoplasm of breast: Secondary | ICD-10-CM

## 2018-04-20 ENCOUNTER — Other Ambulatory Visit: Payer: Self-pay | Admitting: Interventional Cardiology

## 2018-04-28 DIAGNOSIS — N390 Urinary tract infection, site not specified: Secondary | ICD-10-CM | POA: Diagnosis not present

## 2018-05-19 ENCOUNTER — Ambulatory Visit
Admission: RE | Admit: 2018-05-19 | Discharge: 2018-05-19 | Disposition: A | Discharge status: Home / Self Care | Payer: PPO | Source: Ambulatory Visit | Attending: Surgery | Admitting: Surgery

## 2018-05-19 DIAGNOSIS — Z1231 Encounter for screening mammogram for malignant neoplasm of breast: Secondary | ICD-10-CM | POA: Diagnosis not present

## 2018-07-27 DIAGNOSIS — I129 Hypertensive chronic kidney disease with stage 1 through stage 4 chronic kidney disease, or unspecified chronic kidney disease: Secondary | ICD-10-CM | POA: Diagnosis not present

## 2018-07-27 DIAGNOSIS — H9193 Unspecified hearing loss, bilateral: Secondary | ICD-10-CM | POA: Diagnosis not present

## 2018-07-27 DIAGNOSIS — K219 Gastro-esophageal reflux disease without esophagitis: Secondary | ICD-10-CM | POA: Diagnosis not present

## 2018-07-27 DIAGNOSIS — N183 Chronic kidney disease, stage 3 (moderate): Secondary | ICD-10-CM | POA: Diagnosis not present

## 2018-07-27 DIAGNOSIS — R42 Dizziness and giddiness: Secondary | ICD-10-CM | POA: Diagnosis not present

## 2018-07-27 DIAGNOSIS — E1122 Type 2 diabetes mellitus with diabetic chronic kidney disease: Secondary | ICD-10-CM | POA: Diagnosis not present

## 2018-08-27 DIAGNOSIS — N390 Urinary tract infection, site not specified: Secondary | ICD-10-CM | POA: Diagnosis not present

## 2018-08-30 DIAGNOSIS — E1122 Type 2 diabetes mellitus with diabetic chronic kidney disease: Secondary | ICD-10-CM | POA: Diagnosis not present

## 2018-08-30 DIAGNOSIS — F22 Delusional disorders: Secondary | ICD-10-CM | POA: Diagnosis not present

## 2018-08-30 DIAGNOSIS — R413 Other amnesia: Secondary | ICD-10-CM | POA: Diagnosis not present

## 2018-08-30 DIAGNOSIS — D649 Anemia, unspecified: Secondary | ICD-10-CM | POA: Diagnosis not present

## 2018-09-07 DIAGNOSIS — N183 Chronic kidney disease, stage 3 (moderate): Secondary | ICD-10-CM | POA: Diagnosis not present

## 2018-09-15 DIAGNOSIS — N183 Chronic kidney disease, stage 3 (moderate): Secondary | ICD-10-CM | POA: Diagnosis not present

## 2018-09-21 ENCOUNTER — Emergency Department (HOSPITAL_BASED_OUTPATIENT_CLINIC_OR_DEPARTMENT_OTHER): Payer: PPO

## 2018-09-21 ENCOUNTER — Other Ambulatory Visit: Payer: Self-pay

## 2018-09-21 ENCOUNTER — Encounter (HOSPITAL_BASED_OUTPATIENT_CLINIC_OR_DEPARTMENT_OTHER): Payer: Self-pay

## 2018-09-21 ENCOUNTER — Emergency Department (HOSPITAL_BASED_OUTPATIENT_CLINIC_OR_DEPARTMENT_OTHER)
Admission: EM | Admit: 2018-09-21 | Discharge: 2018-09-21 | Disposition: A | Discharge status: Home / Self Care | Payer: PPO | Attending: Emergency Medicine | Admitting: Emergency Medicine

## 2018-09-21 DIAGNOSIS — Z7982 Long term (current) use of aspirin: Secondary | ICD-10-CM | POA: Diagnosis not present

## 2018-09-21 DIAGNOSIS — R42 Dizziness and giddiness: Secondary | ICD-10-CM | POA: Diagnosis not present

## 2018-09-21 DIAGNOSIS — E1122 Type 2 diabetes mellitus with diabetic chronic kidney disease: Secondary | ICD-10-CM | POA: Diagnosis not present

## 2018-09-21 DIAGNOSIS — Z87891 Personal history of nicotine dependence: Secondary | ICD-10-CM | POA: Diagnosis not present

## 2018-09-21 DIAGNOSIS — M810 Age-related osteoporosis without current pathological fracture: Secondary | ICD-10-CM | POA: Diagnosis not present

## 2018-09-21 DIAGNOSIS — I10 Essential (primary) hypertension: Secondary | ICD-10-CM | POA: Diagnosis not present

## 2018-09-21 DIAGNOSIS — Z7902 Long term (current) use of antithrombotics/antiplatelets: Secondary | ICD-10-CM | POA: Insufficient documentation

## 2018-09-21 DIAGNOSIS — Y9389 Activity, other specified: Secondary | ICD-10-CM | POA: Insufficient documentation

## 2018-09-21 DIAGNOSIS — I129 Hypertensive chronic kidney disease with stage 1 through stage 4 chronic kidney disease, or unspecified chronic kidney disease: Secondary | ICD-10-CM | POA: Insufficient documentation

## 2018-09-21 DIAGNOSIS — R0902 Hypoxemia: Secondary | ICD-10-CM | POA: Diagnosis not present

## 2018-09-21 DIAGNOSIS — Z79899 Other long term (current) drug therapy: Secondary | ICD-10-CM | POA: Insufficient documentation

## 2018-09-21 DIAGNOSIS — F22 Delusional disorders: Secondary | ICD-10-CM | POA: Diagnosis not present

## 2018-09-21 DIAGNOSIS — I252 Old myocardial infarction: Secondary | ICD-10-CM | POA: Insufficient documentation

## 2018-09-21 DIAGNOSIS — I2510 Atherosclerotic heart disease of native coronary artery without angina pectoris: Secondary | ICD-10-CM | POA: Diagnosis not present

## 2018-09-21 DIAGNOSIS — W1839XA Other fall on same level, initial encounter: Secondary | ICD-10-CM | POA: Insufficient documentation

## 2018-09-21 DIAGNOSIS — Y929 Unspecified place or not applicable: Secondary | ICD-10-CM | POA: Insufficient documentation

## 2018-09-21 DIAGNOSIS — S0990XA Unspecified injury of head, initial encounter: Secondary | ICD-10-CM | POA: Diagnosis not present

## 2018-09-21 DIAGNOSIS — N183 Chronic kidney disease, stage 3 (moderate): Secondary | ICD-10-CM | POA: Diagnosis not present

## 2018-09-21 DIAGNOSIS — Y999 Unspecified external cause status: Secondary | ICD-10-CM | POA: Insufficient documentation

## 2018-09-21 DIAGNOSIS — S0083XA Contusion of other part of head, initial encounter: Secondary | ICD-10-CM | POA: Diagnosis not present

## 2018-09-21 DIAGNOSIS — R609 Edema, unspecified: Secondary | ICD-10-CM | POA: Diagnosis not present

## 2018-09-21 DIAGNOSIS — I959 Hypotension, unspecified: Secondary | ICD-10-CM | POA: Diagnosis not present

## 2018-09-21 DIAGNOSIS — W19XXXA Unspecified fall, initial encounter: Secondary | ICD-10-CM

## 2018-09-21 LAB — BASIC METABOLIC PANEL
Anion gap: 10 (ref 5–15)
BUN: 30 mg/dL — ABNORMAL HIGH (ref 8–23)
CO2: 26 mmol/L (ref 22–32)
Calcium: 11.1 mg/dL — ABNORMAL HIGH (ref 8.9–10.3)
Chloride: 98 mmol/L (ref 98–111)
Creatinine, Ser: 1.42 mg/dL — ABNORMAL HIGH (ref 0.44–1.00)
GFR calc Af Amer: 39 mL/min — ABNORMAL LOW (ref >60)
GFR calc non Af Amer: 34 mL/min — ABNORMAL LOW (ref >60)
Glucose, Bld: 205 mg/dL — ABNORMAL HIGH (ref 70–99)
Potassium: 3.7 mmol/L (ref 3.5–5.1)
Sodium: 134 mmol/L — ABNORMAL LOW (ref 135–145)

## 2018-09-21 LAB — CBC WITH DIFFERENTIAL/PLATELET
Abs Immature Granulocytes: 0.03 10*3/uL (ref 0.00–0.07)
Basophils Absolute: 0 10*3/uL (ref 0.0–0.1)
Basophils Relative: 0 %
Eosinophils Absolute: 0.1 10*3/uL (ref 0.0–0.5)
Eosinophils Relative: 1 %
HCT: 33.4 % — ABNORMAL LOW (ref 36.0–46.0)
Hemoglobin: 10.4 g/dL — ABNORMAL LOW (ref 12.0–15.0)
Immature Granulocytes: 0 %
Lymphocytes Relative: 16 %
Lymphs Abs: 1.4 10*3/uL (ref 0.7–4.0)
MCH: 26.5 pg (ref 26.0–34.0)
MCHC: 31.1 g/dL (ref 30.0–36.0)
MCV: 85.2 fL (ref 80.0–100.0)
Monocytes Absolute: 0.5 10*3/uL (ref 0.1–1.0)
Monocytes Relative: 6 %
Neutro Abs: 6.3 10*3/uL (ref 1.7–7.7)
Neutrophils Relative %: 77 %
Platelets: 389 10*3/uL (ref 150–400)
RBC: 3.92 MIL/uL (ref 3.87–5.11)
RDW: 14.1 % (ref 11.5–15.5)
WBC: 8.3 10*3/uL (ref 4.0–10.5)
nRBC: 0 % (ref 0.0–0.2)

## 2018-09-21 MED ORDER — MECLIZINE HCL 25 MG PO TABS: 25.0000 mg | ORAL_TABLET | Freq: Three times a day (TID) | ORAL | 0 refills | Status: DC

## 2018-09-21 MED ORDER — SODIUM CHLORIDE 0.9 % IV BOLUS
1000.0000 mL | Freq: Once | INTRAVENOUS | Status: AC
Start: 2018-09-21 — End: 2018-09-21
  Administered 2018-09-21: 1000 mL via INTRAVENOUS

## 2018-09-21 NOTE — ED Notes (Signed)
Pt returned from CT °

## 2018-09-21 NOTE — ED Provider Notes (Signed)
[facility] EMERGENCY DEPARTMENT Provider Note   CSN: [id number] Arrival date & time: 09/21/18  1549    History   Chief Complaint Chief Complaint  Patient presents with  . Fall    HPI [name] is a 85 y.o. female.     85 yo F with a chief complaint of a fall.  Patient states that she has been dizzy recently.  States that the chronic problem for her.  Saw her doctor earlier today and was prescribed meclizine.  Patient had gotten up to walk to the bathroom and felt unsteady and fell to the ground.  Struck the back of her head.  She denies other injury in the fall.  States that she otherwise feels well.  Denies any decreased oral intake denies cough congestion fever denies chest pain shortness breath abdominal pain vomiting or diarrhea.  Patient denies difficulty with speech or swallowing denies unilateral numbness or weakness denies difficulty with naming.  Patient denies history of prior stroke.  She has no neck pain.  The history is provided by the patient.  Fall  This is a new problem. The current episode started 1 to 2 hours ago. The problem occurs constantly. The problem has not changed since onset.Pertinent negatives include no chest pain, no abdominal pain, no headaches and no shortness of breath. Nothing aggravates the symptoms. Nothing relieves the symptoms. She has tried nothing for the symptoms. The treatment provided no relief.    Past Medical History:  Diagnosis Date  . Bradycardia    a. Coreg decreased due to HR low 40s on event monitor 14-Jul-2015 -> further decreased due to HR upper 30s in 01/2016.  [name] CAD (coronary artery disease)    a. RV infarct 2005/07/13 c/b high grade AV block with total occlusion of RCA, unable to treat with PCI. b. Nuc 10/2015: scar but no ischemia.  . Carotid artery disease ([place])   . Chronic anemia   . Chronic lower back pain   . CKD (chronic kidney disease), stage III ([place])   . Complication of anesthesia    "almost died when I had my  breast reduction"  . Dyslipidemia   . GERD (gastroesophageal reflux disease)   . Heart murmur   . High cholesterol   . History of hiatal hernia   . HTN (hypertension)   . Hyperthyroidism   . Meniere disease    "for years" takes Antivert 3 times per day (01/31/2016)  . Myocardial infarction [facility]) 07-13-2005   "almost died"  . Near syncope   . Osteoporosis   . Renal artery stenosis (HCC)    a. left renal artery stent 07/14/2010. b. s/p repeat left renal stenting 01/2016.  . Stroke [facility])    a. Prior infarcts seen on head CT.  [name] Type II diabetes mellitus ([place])    "controlled w/diet and exercise only" (01/31/2016)    Patient Active Problem List   Diagnosis Date Noted  . Angina at rest [facility]) 06/15/2016  . Hypertensive urgency 06/15/2016  . Carotid artery disease ([place]) 02/27/2016  . Anemia 02/01/2016  . Bradycardia 02/01/2016  . Left renal artery stenosis ([place]) 01/31/2016  . Compression fracture of L1 lumbar vertebra (HCC) 04/23/2015  . T12 compression fracture ([place]) 03/20/2015  . Hyperthyroidism 03/11/2015  . Thyroid nodule 03/11/2015  . Syncope 03/08/2015  . Hypokalemia 03/08/2015  . Weakness 02/26/2015  . Compression fracture of T12 vertebra ([place]) 02/26/2015  . CKD (chronic kidney disease), stage III ([place]) 02/26/2015  . Multiple allergies 02/26/2015  .  Abdominal pain 02/04/2015  . Hyponatremia 02/04/2015  . Left flank pain 02/03/2015  . LLQ abdominal pain 08/21/2012  . Indigestion 12/21/2011  . DM (diabetes mellitus) ([place]) 12/21/2011  . CAD (coronary artery disease) 12/21/2011  . HTN (hypertension) 12/21/2011  . Hyperlipidemia 12/21/2011  . GERD (gastroesophageal reflux disease) 12/21/2011    Past Surgical History:  Procedure Laterality Date  . ANTERIOR CERVICAL DECOMP/DISCECTOMY FUSION  02/2002   Archie Endo 08/26/2010  . APPENDECTOMY    . BACK SURGERY    . BLADDER SUSPENSION  x3  . BREAST LUMPECTOMY  11/2007   breast needle-localized lumpectomy/notes 08/12/2012  . CARDIAC  CATHETERIZATION  2007   "couldn't get stents in; I almost died'  . CATARACT EXTRACTION W/ INTRAOCULAR LENS  IMPLANT, BILATERAL Bilateral   . CATARACT EXTRACTION W/ INTRAOCULAR LENS  IMPLANT, BILATERAL Bilateral   . CHOLECYSTECTOMY OPEN    . FRACTURE SURGERY    . HIP FRACTURE SURGERY Right 2011   "added rod in my leg and a ball"  . KYPHOPLASTY N/A 03/20/2015   Procedure: THORACIC TWELVE KYPHOPLASTY;  Surgeon: Jovita Gamma, MD;  Location: [facility] NEURO ORS;  Service: Neurosurgery;  Laterality: N/A;  . KYPHOPLASTY N/A 04/23/2015   Procedure: KYPHOPLASTY - LUMBAR ONE;  Surgeon: Jovita Gamma, MD;  Location: [facility] NEURO ORS;  Service: Neurosurgery;  Laterality: N/A;  L1 Kyphoplasty  . NECK HARDWARE REMOVAL  11/2002   "replaced screw"  . PERIPHERAL VASCULAR CATHETERIZATION Left 12/20/2014   Procedure: Renal Angiography;  Surgeon: Wellington Hampshire, MD;  Location: [place] CV LAB;  Service: Cardiovascular;  Laterality: Left;  . PERIPHERAL VASCULAR CATHETERIZATION N/A 01/31/2016   Procedure: Renal Angiography;  Surgeon: Lorretta Harp, MD;  Location: [place] CV LAB;  Service: Cardiovascular;  Laterality: N/A;  . PERIPHERAL VASCULAR CATHETERIZATION N/A 01/31/2016   Procedure: Abdominal Aortogram;  Surgeon: Lorretta Harp, MD;  Location: [place] CV LAB;  Service: Cardiovascular;  Laterality: N/A;  . PERIPHERAL VASCULAR CATHETERIZATION  01/31/2016   Procedure: Peripheral Vascular Intervention;  Surgeon: Lorretta Harp, MD;  Location: [place] CV LAB;  Service: Cardiovascular;;  . REDUCTION MAMMAPLASTY Bilateral 1985  . renal arteriogram  12/2014   occluded previous Lt renal stent and patent rt renal artery.  [name] RENAL ARTERY STENT Left 2012  . TONSILLECTOMY    . TUBAL LIGATION    . VAGINAL HYSTERECTOMY  ~ 1995  . WRIST FRACTURE SURGERY Left 2012     OB History   No obstetric history on file.      Home Medications    Prior to Admission medications   Medication Sig Start Date End  Date Taking? Authorizing Provider  acetaminophen (TYLENOL) 500 MG tablet Take 1,000 mg by mouth 2 (two) times daily.     [provider]  aspirin 81 MG tablet Take 1 tablet (81 mg total) by mouth daily. 02/01/16   Dunn, Nedra Hai, PA-C  azelastine (ASTELIN) 0.1 % nasal spray Place 1-2 sprays into both nostrils daily as needed. For allergies/congestion. 12/14/15   [provider]  betamethasone valerate lotion (VALISONE) 0.1 % Apply 1 application topically 2 (two) times daily as needed. For irritated skin. 12/06/15   [provider]  calcium carbonate (CALCIUM 600) 1500 (600 Ca) MG TABS tablet Take 1,200 mg by mouth 2 (two) times daily.     [provider]  cholecalciferol (VITAMIN D) 1000 UNITS tablet Take 2,000 Units by mouth daily.     [provider]  cloNIDine (CATAPRES) 0.1 MG  tablet TAKE 1 TABLET BY MOUTH EVERY MORNING AND 2 TABLETS IN THE EVENING 10/17/16   Lorretta Harp, MD  clopidogrel (PLAVIX) 75 MG tablet Take 1 tablet (75 mg total) by mouth daily. Please make overdue appt for future refills. [phone]. 2nd attempt. 04/20/18   Belva Crome, MD  Cod Liver Oil CAPS Take 1 capsule by mouth daily.    [provider]  denosumab (PROLIA) 60 MG/ML SOLN injection Inject 60 mg into the skin every 6 (six) months. Administer in upper arm, thigh, or abdomen    [provider]  docusate sodium (COLACE) 100 MG capsule Take 100-200 mg by mouth 2 (two) times daily. 100MG  in the morning and 200mg  in the evening    [provider]  fexofenadine (ALLEGRA) 180 MG tablet Take 180 mg by mouth daily.    [provider]  furosemide (LASIX) 40 MG tablet Take 1 tablet (40 mg total) by mouth daily. Please make overdue appt with Dr. Tamala Julian or refill with PCP. 1st attempt 01/18/18   Belva Crome, MD  HYDROcodone-acetaminophen [facility]) 5-325 MG tablet Take 1 tablet by mouth every 4 (four) hours as needed for moderate pain. 67/6/72   Delora Fuel, MD  levocetirizine (XYZAL) 5 MG tablet Take 5 mg by mouth every evening.    [provider]  meclizine (ANTIVERT) 25 MG tablet Take 1 tablet (25 mg total) by mouth 3 (three) times daily. 09/21/18   Deno Etienne, DO  methimazole (TAPAZOLE) 5 MG tablet Take 5 mg by mouth daily. 08/27/15   [provider]  Multiple Vitamins-Minerals (PRESERVISION AREDS) CAPS Take 1 capsule by mouth once a week.     [provider]  nitroGLYCERIN (NITROSTAT) 0.4 MG SL tablet Place 0.4 mg under the tongue every 5 (five) minutes as needed for chest pain (3 doses MAX).    [provider]  olmesartan (BENICAR) 20 MG tablet Take 1 tablet (20 mg total) by mouth daily. 06/18/16   Camnitz, Will Hassell Done, MD  pantoprazole (PROTONIX) 40 MG tablet TAKE 1 TABLET BY MOUTH TWICE A DAY BEFORE A MEAL 03/24/16   Dunn, Dayna N, PA-C  predniSONE (DELTASONE) 20 MG tablet Take 2 tablets (40 mg total) by mouth daily. 12/16/68   Delora Fuel, MD  simvastatin (ZOCOR) 20 MG tablet Take 20 mg by mouth 3 (three) times a week. Monday, Wednesday and Friday    [provider]  vitamin E 400 UNIT capsule Take 2,000 Units by mouth daily.     [provider]    Family History Family History  Problem Relation Age of Onset  . Diabetes type II Mother   . Heart attack Mother   . Heart disease Mother   . Congestive Heart Failure Father   . Pulmonary embolism Father   . Alzheimer's disease Sister   . Heart disease Sister   . Heart disease Brother   . Healthy Sister   . Healthy Sister   . Stroke Brother   . Dementia Brother        VASCULAR  . Stroke Brother   . Diabetes type II Brother   . Other Brother        KILLED IN PLANE CRASH  . Pulmonary disease Sister   . Heart attack Sister   . Kidney failure Sister   . Diabetes type II Sister   . Diabetes type II Sister   . Kidney failure Sister     Social History Social History  Tobacco Use  . Smoking status: Former Smoker     Packs/day: 1.00    Years: 30.00    Pack years: 30.00    Types: Cigarettes  . Smokeless tobacco: Never Used  . Tobacco comment: "quit smoking in 1989"  Substance Use Topics  . Alcohol use: No    Alcohol/week: 0.0 standard drinks  . Drug use: No     Allergies   Fentanyl; Fish allergy; Morphine and related; Singulair [montelukast]; Actonel [risedronate sodium]; Dilaudid [hydromorphone hcl]; Fosamax [alendronate sodium]; Hydralazine; Lipitor [atorvastatin]; Lisinopril; Micardis [telmisartan]; Septra [sulfamethoxazole-trimethoprim]; Tekturna [aliskiren]; Tricor [fenofibrate]; Imdur [isosorbide dinitrate]; Amlodipine; Avapro [irbesartan]; Cardio complete [nutritional supplements]; Cardizem [diltiazem hcl]; Ciprofloxacin; Codeine; Cyclobenzaprine; Forteo [parathyroid hormone (recomb)]; Inapsine [droperidol]; Ivp dye [iodinated diagnostic agents]; Shellfish allergy; and Tussionex pennkinetic er [hydrocod polst-cpm polst er]   Review of Systems Review of Systems  Constitutional: Negative for chills and fever.  HENT: Negative for congestion and rhinorrhea.   Eyes: Negative for redness and visual disturbance.  Respiratory: Negative for shortness of breath and wheezing.   Cardiovascular: Negative for chest pain and palpitations.  Gastrointestinal: Negative for abdominal pain, nausea and vomiting.  Genitourinary: Negative for dysuria and urgency.  Musculoskeletal: Negative for arthralgias and myalgias.  Skin: Negative for pallor and wound.  Neurological: Positive for dizziness. Negative for headaches.     Physical Exam Updated Vital Signs BP (!) 154/50   Pulse 76   Temp 98.4 F (36.9 C) (Oral)   Resp 17   Ht 5\' 1"  (1.549 m)   Wt 47.6 kg   SpO2 95%   BMI 19.84 kg/m   Physical Exam Vitals signs and nursing note reviewed.  Constitutional:      General: She is not in acute distress.    Appearance: She is well-developed. She is not diaphoretic.  HENT:     Head: Normocephalic and  atraumatic.  Eyes:     Pupils: Pupils are equal, round, and reactive to light.  Neck:     Musculoskeletal: Normal range of motion and neck supple.  Cardiovascular:     Rate and Rhythm: Normal rate and regular rhythm.     Heart sounds: No murmur. No friction rub. No gallop.   Pulmonary:     Effort: Pulmonary effort is normal.     Breath sounds: No wheezing or rales.  Abdominal:     General: There is no distension.     Palpations: Abdomen is soft.     Tenderness: There is no abdominal tenderness.  Musculoskeletal:        General: No tenderness.  Skin:    General: Skin is warm and dry.  Neurological:     Mental Status: She is alert and oriented to person, place, and time.     GCS: GCS eye subscore is 4. GCS verbal subscore is 5. GCS motor subscore is 6.     Comments: Unilateral palate elevation left side is raised compared to the right.  [place] finger-to-nose bilaterally.  Some wavering heel-to-shin worse on the right than the left lower extremity  Psychiatric:        Behavior: Behavior normal.      ED Treatments / Results  Labs (all labs ordered are listed, but only abnormal results are displayed) Labs Reviewed  CBC WITH DIFFERENTIAL/PLATELET - Abnormal; Notable for the following components:      Result Value   Hemoglobin 10.4 (*)    HCT 33.4 (*)    All other components within normal limits  BASIC METABOLIC PANEL -  Abnormal; Notable for the following components:   Sodium 134 (*)    Glucose, Bld 205 (*)    BUN 30 (*)    Creatinine, Ser 1.42 (*)    Calcium 11.1 (*)    GFR calc non Af Amer 34 (*)    GFR calc Af Amer 39 (*)    All other components within normal limits    EKG None  Radiology Ct Head Wo Contrast  Result Date: 09/21/2018 CLINICAL DATA:  Fall with hematoma to back of head EXAM: CT HEAD WITHOUT CONTRAST TECHNIQUE: Contiguous axial images were obtained from the base of the skull through the vertex without intravenous contrast. COMPARISON:  MRI 08/05/2017,  CT brain 10/25/2015 FINDINGS: Brain: No acute territorial infarction, hemorrhage or intracranial mass. Mild atrophy. Moderate to marked small vessel ischemic changes of the white matter. Chronic lacunar infarcts within the left basal ganglia and bilateral cerebellum and left anterior white matter. Stable ventricle size. Vascular: No hyperdense vessels.  Carotid vascular calcification Skull: Sagittal reconstructions suggest possible superior migration of the tip of the dens, incompletely visualized. No skull fracture Sinuses/Orbits: Chronic appearing left nasal bone deformity. Patchy mucosal thickening in the ethmoid sinuses Other: Small left occipital scalp swelling IMPRESSION: 1. No CT evidence for acute intracranial abnormality. Atrophy, small vessel ischemic changes of the white matter and chronic bilateral lacunar infarcts 2. Suspicion of superior migration of the tip of the dens toward the clivus/interval basilar impression. This is incompletely visualized. Consider CT cervical spine for more thorough evaluation Electronically Signed   By: Donavan Foil M.D.   On: 09/21/2018 16:40    Procedures Procedures (including critical care time)  Medications Ordered in ED Medications  sodium chloride 0.9 % bolus 1,000 mL (1,000 mLs Intravenous New Bag/Given 09/21/18 1712)     Initial Impression / Assessment and Plan / ED Course  I have reviewed the triage vital signs and the nursing notes.  Pertinent labs & imaging results that were available during my care of the patient were reviewed by me and considered in my medical decision making (see chart for details).        85 yo F with a chief complaint of dizziness.  Described as a sensation the room is spinning.  Patient states that she is had chronic problems with vertigo and this feels the same.  States is been going on for some time but worsening over the past few days.  Saw her doctor and was prescribed a course of meclizine.  Patient ended up having a  fall due to the dizziness.  My neuro exam with possible cranial nerve deficit, I discussed this with the patient and she was declining advanced imaging at this time.  She would prefer to discuss it with her family physician.  Will obtain a laboratory evaluation to assess for anemia, electrolyte abnormality or acute kidney dysfunction.  We will give a fluid bolus.  Reassess.  The patient continues to feel well.  CT of the head is negative for acute intracranial pathology there is some concern about malplacement of the dens.  Patient is having no neck pain on reassessment.  I discussed the radiologist concern and the patient is declining CT imaging of the neck.  I again discussed my concern about the possible cranial nerve deficit and obtaining an MRI.  Patient again would not like to have this performed.  I will have her discuss her visit with her family doctor.  Will refer to neurology.  5:48 PM:  I have  discussed the diagnosis/risks/treatment options with the patient and believe the pt to be eligible for discharge home to follow-up with PCP, neuro. We also discussed returning to the ED immediately if new or worsening sx occur. We discussed the sx which are most concerning (e.g., sudden worsening pain, fever, inability to tolerate by mouth) that necessitate immediate return. Medications administered to the patient during their visit and any new prescriptions provided to the patient are listed below.  Medications given during this visit Medications  sodium chloride 0.9 % bolus 1,000 mL (1,000 mLs Intravenous New Bag/Given 09/21/18 1712)     The patient appears reasonably screen and/or stabilized for discharge and I doubt any other medical condition or other [facility] requiring further screening, evaluation, or treatment in the ED at this time prior to discharge.    Final Clinical Impressions(s) / ED Diagnoses   Final diagnoses:  Dizziness  Fall, initial encounter    ED Discharge Orders         Ordered     meclizine (ANTIVERT) 25 MG tablet  3 times daily     09/21/18 1744    Ambulatory referral to Neurology    Comments:  Cranial nerve deficit?  vertigo   09/21/18 [place], [place], DO 09/21/18 1748

## 2018-09-21 NOTE — Discharge Instructions (Signed)
Please discussed your visit here with your family doctor.  See if they want you to continue your meclizine.  See also if they want you to see a neurologist in the office.  Please return to the emergency room at any time you wish to have advanced imaging of your head.

## 2018-09-21 NOTE — ED Notes (Signed)
ED Provider at bedside. 

## 2018-09-21 NOTE — ED Notes (Signed)
Pt assisted to BR via wheelchair. Pt voices dizziness

## 2018-09-21 NOTE — ED Triage Notes (Signed)
Pt arrives via [facility] after a fall. Pt was seen at PCP this am for vertigo and d/c meclizine. Pt has hematoma to back of head- pt on Plavix. Pt a/o x 4. Neuro intact. No c/o of pain.

## 2018-10-01 ENCOUNTER — Other Ambulatory Visit (HOSPITAL_COMMUNITY): Payer: Self-pay | Admitting: Cardiovascular Disease

## 2018-10-01 DIAGNOSIS — I701 Atherosclerosis of renal artery: Secondary | ICD-10-CM

## 2018-10-04 DIAGNOSIS — F419 Anxiety disorder, unspecified: Secondary | ICD-10-CM | POA: Diagnosis not present

## 2018-10-04 DIAGNOSIS — Z7984 Long term (current) use of oral hypoglycemic drugs: Secondary | ICD-10-CM | POA: Diagnosis not present

## 2018-10-04 DIAGNOSIS — E1122 Type 2 diabetes mellitus with diabetic chronic kidney disease: Secondary | ICD-10-CM | POA: Diagnosis not present

## 2018-10-06 ENCOUNTER — Ambulatory Visit (HOSPITAL_BASED_OUTPATIENT_CLINIC_OR_DEPARTMENT_OTHER)
Admission: RE | Admit: 2018-10-06 | Discharge: 2018-10-06 | Disposition: A | Discharge status: Home / Self Care | Payer: PPO | Source: Ambulatory Visit | Attending: Cardiovascular Disease | Admitting: Cardiovascular Disease

## 2018-10-06 ENCOUNTER — Ambulatory Visit (HOSPITAL_COMMUNITY)
Admission: RE | Admit: 2018-10-06 | Discharge: 2018-10-06 | Disposition: A | Discharge status: Home / Self Care | Payer: PPO | Source: Ambulatory Visit | Attending: Internal Medicine | Admitting: Internal Medicine

## 2018-10-06 ENCOUNTER — Other Ambulatory Visit: Payer: Self-pay

## 2018-10-06 DIAGNOSIS — I779 Disorder of arteries and arterioles, unspecified: Secondary | ICD-10-CM

## 2018-10-06 DIAGNOSIS — I701 Atherosclerosis of renal artery: Secondary | ICD-10-CM | POA: Diagnosis not present

## 2018-10-06 DIAGNOSIS — I739 Peripheral vascular disease, unspecified: Secondary | ICD-10-CM | POA: Diagnosis not present

## 2018-10-07 ENCOUNTER — Other Ambulatory Visit: Payer: Self-pay

## 2018-10-07 DIAGNOSIS — I6523 Occlusion and stenosis of bilateral carotid arteries: Secondary | ICD-10-CM

## 2018-10-07 NOTE — Progress Notes (Signed)
Notes recorded by Lorretta Harp, MD on 10/06/2018 at 1:18 PM EDT  No change from prior study. Repeat in 12 months.  History of left renal artery stent.

## 2018-10-07 NOTE — Progress Notes (Signed)
Notes recorded by Lorretta Harp, MD on 10/06/2018 at 1:18 PM EDT  No change from prior study. Repeat in 12 months.  Carotid artery disease

## 2018-11-23 DIAGNOSIS — K219 Gastro-esophageal reflux disease without esophagitis: Secondary | ICD-10-CM | POA: Diagnosis not present

## 2018-11-23 DIAGNOSIS — E782 Mixed hyperlipidemia: Secondary | ICD-10-CM | POA: Diagnosis not present

## 2018-11-23 DIAGNOSIS — E1122 Type 2 diabetes mellitus with diabetic chronic kidney disease: Secondary | ICD-10-CM | POA: Diagnosis not present

## 2018-11-23 DIAGNOSIS — M810 Age-related osteoporosis without current pathological fracture: Secondary | ICD-10-CM | POA: Diagnosis not present

## 2018-11-23 DIAGNOSIS — I129 Hypertensive chronic kidney disease with stage 1 through stage 4 chronic kidney disease, or unspecified chronic kidney disease: Secondary | ICD-10-CM | POA: Diagnosis not present

## 2018-11-23 DIAGNOSIS — N183 Chronic kidney disease, stage 3 (moderate): Secondary | ICD-10-CM | POA: Diagnosis not present

## 2018-11-23 DIAGNOSIS — E0590 Thyrotoxicosis, unspecified without thyrotoxic crisis or storm: Secondary | ICD-10-CM | POA: Diagnosis not present

## 2018-11-23 DIAGNOSIS — R413 Other amnesia: Secondary | ICD-10-CM | POA: Diagnosis not present

## 2018-12-15 ENCOUNTER — Other Ambulatory Visit: Payer: Self-pay

## 2018-12-15 ENCOUNTER — Ambulatory Visit: Payer: PPO | Admitting: Podiatry

## 2018-12-15 ENCOUNTER — Ambulatory Visit (INDEPENDENT_AMBULATORY_CARE_PROVIDER_SITE_OTHER): Payer: PPO

## 2018-12-15 DIAGNOSIS — M7752 Other enthesopathy of left foot: Secondary | ICD-10-CM

## 2018-12-15 DIAGNOSIS — M79672 Pain in left foot: Secondary | ICD-10-CM

## 2018-12-15 NOTE — Progress Notes (Signed)
Subjective:   Patient ID: [name], female   DOB: 85 y.o.   MRN: [id number]   HPI Patient presents stating she got bit by a spider on her left foot and has developed stabbing pain and had discoloration and swelling and it seems to be improving but she is concerned and wants to get it checked.  She does not smoke and would like to be more active if possible   Review of Systems  All other systems reviewed and are negative.       Objective:  Physical Exam Vitals signs and nursing note reviewed.  Constitutional:      Appearance: She is well-developed.  Pulmonary:     Effort: Pulmonary effort is normal.  Musculoskeletal: Normal range of motion.  Skin:    General: Skin is warm.  Neurological:     Mental Status: She is alert.     Neurovascular status was found to be somewhat diminished with sharp dull vibratory diminished.  Patient is found to have diminished skin growth bilateral and diminished hair growth and does have some swelling still the left foot that is localized with no proximal edema or edema drainage no breakage of skin noted has good digital perfusion well oriented x3     Assessment:  Probability for a spider bite creating an inflammatory process which is led to condition noted     Plan:  H&P condition reviewed and at this point I have recommended Epson salt soaks elevation compression and I do think this will be uneventful and healing.  Reappoint if symptoms indicate  X-rays indicate there is some calcification of the arterial system but I did not note any ostial lysis with moderate arthritis noted

## 2018-12-16 ENCOUNTER — Other Ambulatory Visit: Payer: Self-pay | Admitting: Podiatry

## 2018-12-16 DIAGNOSIS — M779 Enthesopathy, unspecified: Secondary | ICD-10-CM

## 2018-12-17 DIAGNOSIS — N390 Urinary tract infection, site not specified: Secondary | ICD-10-CM | POA: Diagnosis not present

## 2018-12-21 DIAGNOSIS — Z03818 Encounter for observation for suspected exposure to other biological agents ruled out: Secondary | ICD-10-CM | POA: Diagnosis not present

## 2018-12-23 DIAGNOSIS — N183 Chronic kidney disease, stage 3 (moderate): Secondary | ICD-10-CM | POA: Diagnosis not present

## 2018-12-23 DIAGNOSIS — H2521 Age-related cataract, morgagnian type, right eye: Secondary | ICD-10-CM | POA: Diagnosis not present

## 2018-12-23 DIAGNOSIS — R42 Dizziness and giddiness: Secondary | ICD-10-CM | POA: Diagnosis not present

## 2018-12-23 DIAGNOSIS — I2510 Atherosclerotic heart disease of native coronary artery without angina pectoris: Secondary | ICD-10-CM | POA: Diagnosis not present

## 2018-12-23 DIAGNOSIS — M6281 Muscle weakness (generalized): Secondary | ICD-10-CM | POA: Diagnosis not present

## 2018-12-23 DIAGNOSIS — K219 Gastro-esophageal reflux disease without esophagitis: Secondary | ICD-10-CM | POA: Diagnosis not present

## 2018-12-23 DIAGNOSIS — R2689 Other abnormalities of gait and mobility: Secondary | ICD-10-CM | POA: Diagnosis not present

## 2018-12-23 DIAGNOSIS — M5030 Other cervical disc degeneration, unspecified cervical region: Secondary | ICD-10-CM | POA: Diagnosis not present

## 2018-12-29 DIAGNOSIS — R300 Dysuria: Secondary | ICD-10-CM | POA: Diagnosis not present

## 2019-01-04 DIAGNOSIS — E113293 Type 2 diabetes mellitus with mild nonproliferative diabetic retinopathy without macular edema, bilateral: Secondary | ICD-10-CM | POA: Diagnosis not present

## 2019-01-04 DIAGNOSIS — H524 Presbyopia: Secondary | ICD-10-CM | POA: Diagnosis not present

## 2019-01-04 DIAGNOSIS — D3131 Benign neoplasm of right choroid: Secondary | ICD-10-CM | POA: Diagnosis not present

## 2019-01-11 DIAGNOSIS — J3089 Other allergic rhinitis: Secondary | ICD-10-CM | POA: Diagnosis not present

## 2019-01-13 DIAGNOSIS — M6281 Muscle weakness (generalized): Secondary | ICD-10-CM | POA: Diagnosis not present

## 2019-01-13 DIAGNOSIS — H2521 Age-related cataract, morgagnian type, right eye: Secondary | ICD-10-CM | POA: Diagnosis not present

## 2019-01-13 DIAGNOSIS — R42 Dizziness and giddiness: Secondary | ICD-10-CM | POA: Diagnosis not present

## 2019-01-13 DIAGNOSIS — R2689 Other abnormalities of gait and mobility: Secondary | ICD-10-CM | POA: Diagnosis not present

## 2019-01-13 DIAGNOSIS — M5030 Other cervical disc degeneration, unspecified cervical region: Secondary | ICD-10-CM | POA: Diagnosis not present

## 2019-01-13 DIAGNOSIS — I2510 Atherosclerotic heart disease of native coronary artery without angina pectoris: Secondary | ICD-10-CM | POA: Diagnosis not present

## 2019-01-13 DIAGNOSIS — N183 Chronic kidney disease, stage 3 unspecified: Secondary | ICD-10-CM | POA: Diagnosis not present

## 2019-01-13 DIAGNOSIS — K219 Gastro-esophageal reflux disease without esophagitis: Secondary | ICD-10-CM | POA: Diagnosis not present

## 2019-02-03 DIAGNOSIS — N390 Urinary tract infection, site not specified: Secondary | ICD-10-CM | POA: Diagnosis not present

## 2019-02-14 DIAGNOSIS — H2521 Age-related cataract, morgagnian type, right eye: Secondary | ICD-10-CM | POA: Diagnosis not present

## 2019-02-14 DIAGNOSIS — R42 Dizziness and giddiness: Secondary | ICD-10-CM | POA: Diagnosis not present

## 2019-02-14 DIAGNOSIS — M5030 Other cervical disc degeneration, unspecified cervical region: Secondary | ICD-10-CM | POA: Diagnosis not present

## 2019-02-14 DIAGNOSIS — M6281 Muscle weakness (generalized): Secondary | ICD-10-CM | POA: Diagnosis not present

## 2019-02-14 DIAGNOSIS — I2510 Atherosclerotic heart disease of native coronary artery without angina pectoris: Secondary | ICD-10-CM | POA: Diagnosis not present

## 2019-02-14 DIAGNOSIS — R2689 Other abnormalities of gait and mobility: Secondary | ICD-10-CM | POA: Diagnosis not present

## 2019-02-15 ENCOUNTER — Encounter (HOSPITAL_COMMUNITY): Payer: Self-pay | Admitting: Emergency Medicine

## 2019-02-15 ENCOUNTER — Emergency Department (HOSPITAL_COMMUNITY): Payer: PPO

## 2019-02-15 ENCOUNTER — Observation Stay (HOSPITAL_COMMUNITY)
Admission: EM | Admit: 2019-02-15 | Discharge: 2019-02-16 | Disposition: A | Discharge status: Home / Self Care | Payer: PPO | Attending: Internal Medicine | Admitting: Internal Medicine

## 2019-02-15 ENCOUNTER — Other Ambulatory Visit: Payer: Self-pay

## 2019-02-15 DIAGNOSIS — I10 Essential (primary) hypertension: Secondary | ICD-10-CM | POA: Diagnosis present

## 2019-02-15 DIAGNOSIS — E785 Hyperlipidemia, unspecified: Secondary | ICD-10-CM | POA: Insufficient documentation

## 2019-02-15 DIAGNOSIS — D649 Anemia, unspecified: Secondary | ICD-10-CM | POA: Diagnosis not present

## 2019-02-15 DIAGNOSIS — E1122 Type 2 diabetes mellitus with diabetic chronic kidney disease: Secondary | ICD-10-CM | POA: Insufficient documentation

## 2019-02-15 DIAGNOSIS — I130 Hypertensive heart and chronic kidney disease with heart failure and stage 1 through stage 4 chronic kidney disease, or unspecified chronic kidney disease: Secondary | ICD-10-CM | POA: Insufficient documentation

## 2019-02-15 DIAGNOSIS — E119 Type 2 diabetes mellitus without complications: Secondary | ICD-10-CM

## 2019-02-15 DIAGNOSIS — E041 Nontoxic single thyroid nodule: Secondary | ICD-10-CM

## 2019-02-15 DIAGNOSIS — Z7982 Long term (current) use of aspirin: Secondary | ICD-10-CM | POA: Diagnosis not present

## 2019-02-15 DIAGNOSIS — I25119 Atherosclerotic heart disease of native coronary artery with unspecified angina pectoris: Secondary | ICD-10-CM | POA: Insufficient documentation

## 2019-02-15 DIAGNOSIS — I252 Old myocardial infarction: Secondary | ICD-10-CM | POA: Diagnosis not present

## 2019-02-15 DIAGNOSIS — I6523 Occlusion and stenosis of bilateral carotid arteries: Secondary | ICD-10-CM | POA: Diagnosis not present

## 2019-02-15 DIAGNOSIS — Z66 Do not resuscitate: Secondary | ICD-10-CM | POA: Diagnosis not present

## 2019-02-15 DIAGNOSIS — M5489 Other dorsalgia: Secondary | ICD-10-CM | POA: Diagnosis not present

## 2019-02-15 DIAGNOSIS — Z9181 History of falling: Secondary | ICD-10-CM | POA: Insufficient documentation

## 2019-02-15 DIAGNOSIS — Z20828 Contact with and (suspected) exposure to other viral communicable diseases: Secondary | ICD-10-CM | POA: Insufficient documentation

## 2019-02-15 DIAGNOSIS — E0520 Thyrotoxicosis with toxic multinodular goiter without thyrotoxic crisis or storm: Secondary | ICD-10-CM | POA: Insufficient documentation

## 2019-02-15 DIAGNOSIS — W19XXXA Unspecified fall, initial encounter: Secondary | ICD-10-CM | POA: Diagnosis not present

## 2019-02-15 DIAGNOSIS — R42 Dizziness and giddiness: Secondary | ICD-10-CM | POA: Diagnosis not present

## 2019-02-15 DIAGNOSIS — S3992XA Unspecified injury of lower back, initial encounter: Secondary | ICD-10-CM | POA: Diagnosis not present

## 2019-02-15 DIAGNOSIS — N183 Chronic kidney disease, stage 3 unspecified: Secondary | ICD-10-CM | POA: Diagnosis not present

## 2019-02-15 DIAGNOSIS — R296 Repeated falls: Secondary | ICD-10-CM | POA: Diagnosis not present

## 2019-02-15 DIAGNOSIS — R413 Other amnesia: Secondary | ICD-10-CM | POA: Diagnosis present

## 2019-02-15 DIAGNOSIS — K219 Gastro-esophageal reflux disease without esophagitis: Secondary | ICD-10-CM | POA: Diagnosis not present

## 2019-02-15 DIAGNOSIS — S299XXA Unspecified injury of thorax, initial encounter: Secondary | ICD-10-CM | POA: Diagnosis not present

## 2019-02-15 DIAGNOSIS — I701 Atherosclerosis of renal artery: Secondary | ICD-10-CM | POA: Diagnosis not present

## 2019-02-15 DIAGNOSIS — I2510 Atherosclerotic heart disease of native coronary artery without angina pectoris: Secondary | ICD-10-CM | POA: Diagnosis not present

## 2019-02-15 DIAGNOSIS — R52 Pain, unspecified: Secondary | ICD-10-CM | POA: Diagnosis not present

## 2019-02-15 DIAGNOSIS — I5042 Chronic combined systolic (congestive) and diastolic (congestive) heart failure: Secondary | ICD-10-CM | POA: Diagnosis not present

## 2019-02-15 DIAGNOSIS — M810 Age-related osteoporosis without current pathological fracture: Secondary | ICD-10-CM | POA: Insufficient documentation

## 2019-02-15 DIAGNOSIS — N63 Unspecified lump in unspecified breast: Secondary | ICD-10-CM | POA: Insufficient documentation

## 2019-02-15 DIAGNOSIS — N281 Cyst of kidney, acquired: Secondary | ICD-10-CM | POA: Insufficient documentation

## 2019-02-15 DIAGNOSIS — E871 Hypo-osmolality and hyponatremia: Secondary | ICD-10-CM | POA: Diagnosis not present

## 2019-02-15 DIAGNOSIS — S2242XD Multiple fractures of ribs, left side, subsequent encounter for fracture with routine healing: Secondary | ICD-10-CM | POA: Diagnosis not present

## 2019-02-15 DIAGNOSIS — Z8673 Personal history of transient ischemic attack (TIA), and cerebral infarction without residual deficits: Secondary | ICD-10-CM | POA: Insufficient documentation

## 2019-02-15 DIAGNOSIS — R519 Headache, unspecified: Secondary | ICD-10-CM | POA: Diagnosis not present

## 2019-02-15 DIAGNOSIS — E7800 Pure hypercholesterolemia, unspecified: Secondary | ICD-10-CM | POA: Insufficient documentation

## 2019-02-15 DIAGNOSIS — Y92009 Unspecified place in unspecified non-institutional (private) residence as the place of occurrence of the external cause: Secondary | ICD-10-CM

## 2019-02-15 DIAGNOSIS — M542 Cervicalgia: Secondary | ICD-10-CM | POA: Diagnosis not present

## 2019-02-15 DIAGNOSIS — M545 Low back pain: Secondary | ICD-10-CM | POA: Insufficient documentation

## 2019-02-15 DIAGNOSIS — I679 Cerebrovascular disease, unspecified: Secondary | ICD-10-CM | POA: Diagnosis present

## 2019-02-15 DIAGNOSIS — T148XXA Other injury of unspecified body region, initial encounter: Secondary | ICD-10-CM

## 2019-02-15 DIAGNOSIS — Z7984 Long term (current) use of oral hypoglycemic drugs: Secondary | ICD-10-CM | POA: Insufficient documentation

## 2019-02-15 DIAGNOSIS — Z79899 Other long term (current) drug therapy: Secondary | ICD-10-CM | POA: Insufficient documentation

## 2019-02-15 DIAGNOSIS — Z03818 Encounter for observation for suspected exposure to other biological agents ruled out: Secondary | ICD-10-CM | POA: Diagnosis not present

## 2019-02-15 DIAGNOSIS — S3991XA Unspecified injury of abdomen, initial encounter: Secondary | ICD-10-CM | POA: Diagnosis not present

## 2019-02-15 DIAGNOSIS — Z7902 Long term (current) use of antithrombotics/antiplatelets: Secondary | ICD-10-CM | POA: Insufficient documentation

## 2019-02-15 HISTORY — DX: Unspecified lump in unspecified breast: N63.0

## 2019-02-15 HISTORY — DX: Other amnesia: R41.3

## 2019-02-15 LAB — IRON AND TIBC
Iron: 24 ug/dL — ABNORMAL LOW (ref 28–170)
Saturation Ratios: 6 % — ABNORMAL LOW (ref 10.4–31.8)
TIBC: 375 ug/dL (ref 250–450)
UIBC: 351 ug/dL

## 2019-02-15 LAB — URINALYSIS, ROUTINE W REFLEX MICROSCOPIC
Bacteria, UA: NONE SEEN
Bilirubin Urine: NEGATIVE
Glucose, UA: NEGATIVE mg/dL
Ketones, ur: 5 mg/dL — AB
Leukocytes,Ua: NEGATIVE
Nitrite: NEGATIVE
Protein, ur: 100 mg/dL — AB
Specific Gravity, Urine: 1.004 — ABNORMAL LOW (ref 1.005–1.030)
pH: 7 (ref 5.0–8.0)

## 2019-02-15 LAB — CBC WITH DIFFERENTIAL/PLATELET
Abs Immature Granulocytes: 0.03 10*3/uL (ref 0.00–0.07)
Basophils Absolute: 0 10*3/uL (ref 0.0–0.1)
Basophils Relative: 0 %
Eosinophils Absolute: 0 10*3/uL (ref 0.0–0.5)
Eosinophils Relative: 0 %
HCT: 31.2 % — ABNORMAL LOW (ref 36.0–46.0)
Hemoglobin: 9.8 g/dL — ABNORMAL LOW (ref 12.0–15.0)
Immature Granulocytes: 1 %
Lymphocytes Relative: 15 %
Lymphs Abs: 0.9 10*3/uL (ref 0.7–4.0)
MCH: 23.4 pg — ABNORMAL LOW (ref 26.0–34.0)
MCHC: 31.4 g/dL (ref 30.0–36.0)
MCV: 74.6 fL — ABNORMAL LOW (ref 80.0–100.0)
Monocytes Absolute: 0.3 10*3/uL (ref 0.1–1.0)
Monocytes Relative: 5 %
Neutro Abs: 4.7 10*3/uL (ref 1.7–7.7)
Neutrophils Relative %: 79 %
Platelets: 472 10*3/uL — ABNORMAL HIGH (ref 150–400)
RBC: 4.18 MIL/uL (ref 3.87–5.11)
RDW: 16.6 % — ABNORMAL HIGH (ref 11.5–15.5)
WBC: 5.9 10*3/uL (ref 4.0–10.5)
nRBC: 0 % (ref 0.0–0.2)

## 2019-02-15 LAB — COMPREHENSIVE METABOLIC PANEL
ALT: 17 U/L (ref 0–44)
AST: 23 U/L (ref 15–41)
Albumin: 3.6 g/dL (ref 3.5–5.0)
Alkaline Phosphatase: 68 U/L (ref 38–126)
Anion gap: 14 (ref 5–15)
BUN: 11 mg/dL (ref 8–23)
CO2: 23 mmol/L (ref 22–32)
Calcium: 9.1 mg/dL (ref 8.9–10.3)
Chloride: 88 mmol/L — ABNORMAL LOW (ref 98–111)
Creatinine, Ser: 1.01 mg/dL — ABNORMAL HIGH (ref 0.44–1.00)
GFR calc Af Amer: 59 mL/min — ABNORMAL LOW (ref >60)
GFR calc non Af Amer: 51 mL/min — ABNORMAL LOW (ref >60)
Glucose, Bld: 121 mg/dL — ABNORMAL HIGH (ref 70–99)
Potassium: 3.8 mmol/L (ref 3.5–5.1)
Sodium: 125 mmol/L — ABNORMAL LOW (ref 135–145)
Total Bilirubin: 0.9 mg/dL (ref 0.3–1.2)
Total Protein: 6.5 g/dL (ref 6.5–8.1)

## 2019-02-15 LAB — SODIUM, URINE, RANDOM: Sodium, Ur: 29 mmol/L

## 2019-02-15 LAB — VITAMIN B12: Vitamin B-12: 338 pg/mL (ref 180–914)

## 2019-02-15 LAB — CBG MONITORING, ED
Glucose-Capillary: 125 mg/dL — ABNORMAL HIGH (ref 70–99)
Glucose-Capillary: 146 mg/dL — ABNORMAL HIGH (ref 70–99)

## 2019-02-15 LAB — PROTIME-INR
INR: 1 (ref 0.8–1.2)
Prothrombin Time: 12.7 seconds (ref 11.4–15.2)

## 2019-02-15 LAB — RETICULOCYTES
Immature Retic Fract: 16 % — ABNORMAL HIGH (ref 2.3–15.9)
RBC.: 4.21 MIL/uL (ref 3.87–5.11)
Retic Count, Absolute: 70.3 10*3/uL (ref 19.0–186.0)
Retic Ct Pct: 1.7 % (ref 0.4–3.1)

## 2019-02-15 LAB — TROPONIN I (HIGH SENSITIVITY): Troponin I (High Sensitivity): 25 ng/L — ABNORMAL HIGH (ref <18)

## 2019-02-15 LAB — TSH: TSH: 2.464 u[IU]/mL (ref 0.350–4.500)

## 2019-02-15 LAB — OSMOLALITY: Osmolality: 258 mOsm/kg — ABNORMAL LOW (ref 275–295)

## 2019-02-15 LAB — FERRITIN: Ferritin: 57 ng/mL (ref 11–307)

## 2019-02-15 LAB — CREATININE, URINE, RANDOM: Creatinine, Urine: 29.62 mg/dL

## 2019-02-15 LAB — BRAIN NATRIURETIC PEPTIDE: B Natriuretic Peptide: 1115.9 pg/mL — ABNORMAL HIGH (ref 0.0–100.0)

## 2019-02-15 LAB — OSMOLALITY, URINE: Osmolality, Ur: 152 mOsm/kg — ABNORMAL LOW (ref 300–900)

## 2019-02-15 LAB — FOLATE: Folate: 15.3 ng/mL (ref >5.9)

## 2019-02-15 MED ORDER — CLONIDINE HCL 0.2 MG PO TABS
0.2000 mg | ORAL_TABLET | Freq: Every day | ORAL | Status: DC
  Administered 2019-02-15: 0.2 mg via ORAL
  Filled 2019-02-15: qty 1

## 2019-02-15 MED ORDER — CLOPIDOGREL BISULFATE 75 MG PO TABS
75.0000 mg | ORAL_TABLET | Freq: Every day | ORAL | Status: DC
  Administered 2019-02-16: 75 mg via ORAL
  Filled 2019-02-15: qty 1

## 2019-02-15 MED ORDER — OXYCODONE-ACETAMINOPHEN 5-325 MG PO TABS
1.0000 | ORAL_TABLET | Freq: Once | ORAL | Status: AC
  Administered 2019-02-15: 1 via ORAL
  Filled 2019-02-15: qty 1

## 2019-02-15 MED ORDER — CLONIDINE HCL 0.1 MG PO TABS: 0.1000 mg | ORAL_TABLET | ORAL | Status: DC

## 2019-02-15 MED ORDER — SODIUM CHLORIDE 0.9 % IV SOLN
INTRAVENOUS | Status: DC
  Administered 2019-02-16: 01:00:00 via INTRAVENOUS

## 2019-02-15 MED ORDER — HYDROCODONE-ACETAMINOPHEN 5-325 MG PO TABS
1.0000 | ORAL_TABLET | ORAL | Status: DC | PRN
  Administered 2019-02-15: 2 via ORAL
  Administered 2019-02-16 (×2): 1 via ORAL
  Filled 2019-02-15: qty 1
  Filled 2019-02-15: qty 2
  Filled 2019-02-15: qty 1

## 2019-02-15 MED ORDER — INSULIN ASPART 100 UNIT/ML ~~LOC~~ SOLN: 0.0000 [IU] | Freq: Every day | SUBCUTANEOUS | Status: DC

## 2019-02-15 MED ORDER — METHIMAZOLE 5 MG PO TABS
5.0000 mg | ORAL_TABLET | Freq: Every day | ORAL | Status: DC
  Filled 2019-02-15 (×2): qty 1

## 2019-02-15 MED ORDER — CLONIDINE HCL 0.1 MG PO TABS
0.1000 mg | ORAL_TABLET | Freq: Every day | ORAL | Status: DC
  Administered 2019-02-16: 0.1 mg via ORAL
  Filled 2019-02-15: qty 1

## 2019-02-15 MED ORDER — PANTOPRAZOLE SODIUM 40 MG PO TBEC
40.0000 mg | DELAYED_RELEASE_TABLET | Freq: Two times a day (BID) | ORAL | Status: DC
  Administered 2019-02-15 – 2019-02-16 (×2): 40 mg via ORAL
  Filled 2019-02-15 (×2): qty 1

## 2019-02-15 NOTE — ED Notes (Signed)
Briefs removed with moderate amount of urine. Husband at bedside

## 2019-02-15 NOTE — ED Notes (Signed)
Pt has voided in bed. Linen changed and pure wick replaced.

## 2019-02-15 NOTE — ED Notes (Signed)
Messaged admitting provider about pt pain.

## 2019-02-15 NOTE — ED Provider Notes (Signed)
[facility] EMERGENCY DEPARTMENT Provider Note   CSN: [id number] Arrival date & time: 02/15/19  07-27-55     History   Chief Complaint Chief Complaint  Patient presents with   Fall   Back Pain    HPI [name] is a 85 y.o. female.      Fall This is a new problem. The current episode started more than 2 days ago (4 days ago). The problem occurs constantly. The problem has not changed since onset.Pertinent negatives include no chest pain, no abdominal pain, no headaches and no shortness of breath. The symptoms are aggravated by walking and standing. Nothing relieves the symptoms. She has tried nothing for the symptoms. The treatment provided no relief.  Back Pain Associated symptoms: no abdominal pain, no chest pain, no dysuria, no fever and no headaches   Patient reports that she was ambulating in her house when she had some vertigo and tripped over her husband's mobility scooter.  She has some brief amnesia to the event however she remembers sitting down hard on her backside.  Denies hitting her head.  Denies neck pain or injury.  Past Medical History:  Diagnosis Date   Bradycardia    a. Coreg decreased due to HR low 40s on event monitor Jul 27, 2015 -> further decreased due to HR upper 30s in 01/2016.   CAD (coronary artery disease)    a. RV infarct 07/26/2005 c/b high grade AV block with total occlusion of RCA, unable to treat with PCI. b. Nuc 10/2015: scar but no ischemia.   Carotid artery disease (HCC)    Chronic anemia    Chronic lower back pain    CKD (chronic kidney disease), stage III    Complication of anesthesia    "almost died when I had my breast reduction"   Dyslipidemia    GERD (gastroesophageal reflux disease)    Heart murmur    High cholesterol    History of hiatal hernia    HTN (hypertension)    Hyperthyroidism    Meniere disease    "for years" takes Antivert 3 times per day (01/31/2016)   Myocardial infarction [facility]) 07-26-2005   "almost died"   Near syncope    Osteoporosis    Renal artery stenosis ([place])    a. left renal artery stent 2010-07-27. b. s/p repeat left renal stenting 01/2016.   Stroke [facility])    a. Prior infarcts seen on head CT.   Type II diabetes mellitus ([place])    "controlled w/diet and exercise only" (01/31/2016)    Patient Active Problem List   Diagnosis Date Noted   Pain 12/28/2017   Primary osteoarthritis of left hand 12/28/2017   TMJ pain dysfunction syndrome 08/26/2016   Angina at rest [facility]) 06/15/2016   Hypertensive urgency 06/15/2016   Carotid artery disease ([place]) 02/27/2016   Bradycardia 02/01/2016   Left renal artery stenosis ([place]) 01/31/2016   Presbycusis of both ears 01/29/2016   BPPV (benign paroxysmal positional vertigo) 01/08/2016   Impacted cerumen of right ear 01/08/2016   Compression fracture of L1 lumbar vertebra ([place]) 04/23/2015   T12 compression fracture ([place]) 03/20/2015   Hyperthyroidism 03/11/2015   Thyroid nodule 03/11/2015   Syncope 03/08/2015   Hypokalemia 03/08/2015   Weakness 02/26/2015   Compression fracture of T12 vertebra ([place]) 02/26/2015   CKD (chronic kidney disease), stage III 02/26/2015   Multiple allergies 02/26/2015   Abdominal pain 02/04/2015   Hyponatremia 02/04/2015   DM (diabetes mellitus) ([place]) 12/21/2011   CAD (coronary  artery disease) 12/21/2011   HTN (hypertension) 12/21/2011   Hyperlipidemia 12/21/2011   GERD (gastroesophageal reflux disease) 12/21/2011    Past Surgical History:  Procedure Laterality Date   ANTERIOR CERVICAL DECOMP/DISCECTOMY FUSION  02/2002   Archie Endo 08/26/2010   APPENDECTOMY     BACK SURGERY     BLADDER SUSPENSION  x3   BREAST LUMPECTOMY  11/2007   breast needle-localized lumpectomy/notes 08/12/2012   CARDIAC CATHETERIZATION  2007   "couldn't get stents in; I almost died'   CATARACT EXTRACTION W/ INTRAOCULAR LENS  IMPLANT, BILATERAL Bilateral    CATARACT EXTRACTION W/ INTRAOCULAR  LENS  IMPLANT, BILATERAL Bilateral    CHOLECYSTECTOMY OPEN     FRACTURE SURGERY     HIP FRACTURE SURGERY Right 2011   "added rod in my leg and a ball"   KYPHOPLASTY N/A 03/20/2015   Procedure: THORACIC TWELVE KYPHOPLASTY;  Surgeon: Jovita Gamma, MD;  Location: [facility] NEURO ORS;  Service: Neurosurgery;  Laterality: N/A;   KYPHOPLASTY N/A 04/23/2015   Procedure: KYPHOPLASTY - LUMBAR ONE;  Surgeon: Jovita Gamma, MD;  Location: [place] NEURO ORS;  Service: Neurosurgery;  Laterality: N/A;  L1 Kyphoplasty   NECK HARDWARE REMOVAL  11/2002   "replaced screw"   PERIPHERAL VASCULAR CATHETERIZATION Left 12/20/2014   Procedure: Renal Angiography;  Surgeon: Wellington Hampshire, MD;  Location: [place] CV LAB;  Service: Cardiovascular;  Laterality: Left;   PERIPHERAL VASCULAR CATHETERIZATION N/A 01/31/2016   Procedure: Renal Angiography;  Surgeon: Lorretta Harp, MD;  Location: [place] CV LAB;  Service: Cardiovascular;  Laterality: N/A;   PERIPHERAL VASCULAR CATHETERIZATION N/A 01/31/2016   Procedure: Abdominal Aortogram;  Surgeon: Lorretta Harp, MD;  Location: [place] CV LAB;  Service: Cardiovascular;  Laterality: N/A;   PERIPHERAL VASCULAR CATHETERIZATION  01/31/2016   Procedure: Peripheral Vascular Intervention;  Surgeon: Lorretta Harp, MD;  Location: [place] CV LAB;  Service: Cardiovascular;;   REDUCTION MAMMAPLASTY Bilateral 1985   renal arteriogram  12/2014   occluded previous Lt renal stent and patent rt renal artery.   RENAL ARTERY STENT Left 2012   TONSILLECTOMY     TUBAL LIGATION     VAGINAL HYSTERECTOMY  ~ 1995   WRIST FRACTURE SURGERY Left 2012     OB History   No obstetric history on file.      Home Medications    Prior to Admission medications   Medication Sig Start Date End Date Taking? Authorizing Provider  acetaminophen (TYLENOL) 500 MG tablet Take 1,000 mg by mouth 2 (two) times daily.     [provider]  aspirin 81 MG tablet Take 1  tablet (81 mg total) by mouth daily. 02/01/16   Dunn, Nedra Hai, PA-C  azelastine (ASTELIN) 0.1 % nasal spray Place 1-2 sprays into both nostrils daily as needed. For allergies/congestion. 12/14/15   [provider]  betamethasone dipropionate (DIPROLENE) 0.05 % cream  12/10/18   [provider]  betamethasone valerate lotion (VALISONE) 0.1 % Apply 1 application topically 2 (two) times daily as needed. For irritated skin. 12/06/15   [provider]  cholecalciferol (VITAMIN D) 1000 UNITS tablet Take 2,000 Units by mouth daily.     [provider]  cloNIDine (CATAPRES) 0.1 MG tablet TAKE 1 TABLET BY MOUTH EVERY MORNING AND 2 TABLETS IN THE EVENING 10/17/16   Lorretta Harp, MD  clopidogrel (PLAVIX) 75 MG tablet Take 1 tablet (75 mg total) by mouth daily. Please make overdue appt for future refills. [phone]. 2nd attempt. 04/20/18  Belva Crome, MD  denosumab (PROLIA) 60 MG/ML SOLN injection Inject 60 mg into the skin every 6 (six) months. Administer in upper arm, thigh, or abdomen    [provider]  fexofenadine (ALLEGRA) 180 MG tablet Take 180 mg by mouth daily.    [provider]  FLUZONE HIGH-DOSE QUADRIVALENT 0.7 ML SUSY TO BE ADMINISTERED BY PHARMACIST FOR IMMUNIZATION 12/06/18   [provider]  glimepiride (AMARYL) 1 MG tablet TAKE 1/2 TABLET BY MOUTH ONCE A DAY 09/21/18   [provider]  metFORMIN (GLUCOPHAGE-XR) 500 MG 24 hr tablet TAKE 1 TABLET BY MOUTH EVERY DAY IN THE EVENING WITH FOOD 11/28/18   [provider]  methimazole (TAPAZOLE) 5 MG tablet Take 5 mg by mouth daily. 08/27/15   [provider]  nitroGLYCERIN (NITROSTAT) 0.4 MG SL tablet Place 0.4 mg under the tongue every 5 (five) minutes as needed for chest pain (3 doses MAX).    [provider]  OneTouch Delica Lancets [id number] MISC USE AS DIRECTED TO CHECK BLOOD SUGAR DAILY AND AS NEEDED E11.9 12/04/18   [provider]  ONETOUCH  ULTRA test strip USE AS DIRECTED TO CHECK BLOOD SUGARS DAILY AS NEEDED 11/19/18   [provider]  pantoprazole (PROTONIX) 40 MG tablet TAKE 1 TABLET BY MOUTH TWICE A DAY BEFORE A MEAL 03/24/16   Dunn, Dayna N, PA-C  QUEtiapine (SEROQUEL) 25 MG tablet  12/13/18   [provider]  simvastatin (ZOCOR) 20 MG tablet Take 20 mg by mouth 3 (three) times a week. Monday, Wednesday and Friday    [provider]    Family History Family History  Problem Relation Age of Onset   Diabetes type II Mother    Heart attack Mother    Heart disease Mother    Congestive Heart Failure Father    Pulmonary embolism Father    Alzheimer's disease Sister    Heart disease Sister    Heart disease Brother    Healthy Sister    Healthy Sister    Stroke Brother    Dementia Brother        VASCULAR   Stroke Brother    Diabetes type II Brother    Other Brother        KILLED IN PLANE CRASH   Pulmonary disease Sister    Heart attack Sister    Kidney failure Sister    Diabetes type II Sister    Diabetes type II Sister    Kidney failure Sister     Social History Social History   Tobacco Use   Smoking status: Former Smoker    Packs/day: 1.00    Years: 30.00    Pack years: 30.00    Types: Cigarettes   Smokeless tobacco: Never Used   Tobacco comment: "quit smoking in 1989"  Substance Use Topics   Alcohol use: No    Alcohol/week: 0.0 standard drinks   Drug use: No     Allergies   Fentanyl, Fish allergy, Morphine and related, Singulair [montelukast], Actonel [risedronate sodium], Dilaudid [hydromorphone hcl], Fosamax [alendronate sodium], Hydralazine, Lipitor [atorvastatin], Lisinopril, Micardis [telmisartan], Septra [sulfamethoxazole-trimethoprim], Tekturna [aliskiren], Tricor [fenofibrate], Imdur [isosorbide dinitrate], Amlodipine, Avapro [irbesartan], Cardio complete [nutritional supplements], Cardizem [diltiazem hcl], Ciprofloxacin, Codeine,  Cyclobenzaprine, Forteo [parathyroid hormone (recomb)], Inapsine [droperidol], Ivp dye [iodinated diagnostic agents], Shellfish allergy, and Tussionex pennkinetic er [employer] polst-cpm polst er]   Review of Systems Review of Systems  Constitutional: Negative for chills and fever.  HENT: Negative for ear pain and  sore throat.   Eyes: Negative for pain and visual disturbance.  Respiratory: Negative for cough and shortness of breath.   Cardiovascular: Negative for chest pain and palpitations.  Gastrointestinal: Negative for abdominal pain and vomiting.  Genitourinary: Negative for dysuria and hematuria.  Musculoskeletal: Negative for arthralgias and back pain.  Skin: Negative for color change and rash.  Neurological: Positive for dizziness (Patient is extensive history of vertigo and says she had a vertiginous episode that caused her to fall 4 days ago.  Has not had a similar episode since). Negative for seizures, syncope and headaches.  All other systems reviewed and are negative.    Physical Exam Updated Vital Signs BP (!) 172/60    Pulse 78    Temp 98.4 F (36.9 C) (Oral)    Resp (!) 22    SpO2 94%   Physical Exam Vitals signs and nursing note reviewed.  Constitutional:      General: She is not in acute distress.    Appearance: She is well-developed.  HENT:     Head: Normocephalic and atraumatic.  Eyes:     Conjunctiva/sclera: Conjunctivae normal.  Neck:     Musculoskeletal: Neck supple.  Cardiovascular:     Rate and Rhythm: Normal rate and regular rhythm.     Heart sounds: No murmur.  Pulmonary:     Effort: Pulmonary effort is normal. No respiratory distress.     Breath sounds: Normal breath sounds.  Abdominal:     Palpations: Abdomen is soft.     Tenderness: There is no abdominal tenderness.  Musculoskeletal:        General: Tenderness (There is tenderness palpation of the lumbar and thoracic spines as well as in the sacral region where she has a large approximately 15  cm x 10 cm area of ecchymosis) present. No deformity.  Skin:    General: Skin is warm and dry.  Neurological:     General: No focal deficit present.     Mental Status: She is alert and oriented to person, place, and time.     Cranial Nerves: No cranial nerve deficit.     Sensory: No sensory deficit.     Motor: No weakness.     Coordination: Coordination normal.     Gait: Gait normal.     Deep Tendon Reflexes: Reflexes normal.      ED Treatments / Results  Labs (all labs ordered are listed, but only abnormal results are displayed) Labs Reviewed  CBC WITH DIFFERENTIAL/PLATELET - Abnormal; Notable for the following components:      Result Value   Hemoglobin 9.8 (*)    HCT 31.2 (*)    MCV 74.6 (*)    MCH 23.4 (*)    RDW 16.6 (*)    Platelets 472 (*)    All other components within normal limits  COMPREHENSIVE METABOLIC PANEL - Abnormal; Notable for the following components:   Sodium 125 (*)    Chloride 88 (*)    Glucose, Bld 121 (*)    Creatinine, Ser 1.01 (*)    GFR calc non Af Amer 51 (*)    GFR calc Af Amer 59 (*)    All other components within normal limits  CBG MONITORING, ED - Abnormal; Notable for the following components:   Glucose-Capillary 125 (*)    All other components within normal limits  PROTIME-INR  OSMOLALITY  OSMOLALITY, URINE    EKG None  Radiology Ct Head Wo Contrast  Result Date: 02/15/2019 CLINICAL DATA:  Mechanical fall on Friday, worsening pain since fall, high clinical risk of cervical spine trauma EXAM: CT HEAD WITHOUT CONTRAST CT CERVICAL SPINE WITHOUT CONTRAST TECHNIQUE: Multidetector CT imaging of the head and cervical spine was performed following the standard protocol without intravenous contrast. Multiplanar CT image reconstructions of the cervical spine were also generated. COMPARISON:  CT head 10/25/2015 CT cervical spine 02/22/2009 FINDINGS: CT HEAD FINDINGS Brain: Generalized atrophy. Normal ventricular morphology. No midline shift or  mass effect. Small vessel chronic ischemic changes of deep cerebral white matter. No intracranial hemorrhage, mass lesion, or evidence of acute infarction. No extra-axial fluid collections. Tiny old lacunar infarct LEFT basal ganglia. Vascular: Atherosclerotic calcifications of internal carotid and vertebral arteries at skull base Skull: Demineralized but intact Sinuses/Orbits: Clear Other: N/A CT CERVICAL SPINE FINDINGS Alignment: Chronic anterolisthesis at C2-C3 approximately 2.4 mm. Remaining alignments normal. Skull base and vertebrae: Osseous demineralization. Disc space narrowing C2-C3. Fusion of C3 C7. Anterior plate and screws [id number]. Additional disc space narrowing and endplate spur formation at T1-T2. Vertebral body heights maintained without fracture or additional subluxation. Multilevel facet degenerative changes. Skull base intact. Soft tissues and spinal canal: Prevertebral soft tissues normal thickness. Extensive atherosclerotic calcifications of the carotid and scattered vertebral arteries. Atherosclerotic calcification aortic arch. RIGHT thyroid nodule 2.7 cm diameter with additional isthmic nodule approximately 2.1 cm diameter. Disc levels:  No additional abnormalities Upper chest: Lung apices clear Other: N/A IMPRESSION: Atrophy with small vessel chronic ischemic changes of deep cerebral white matter. Old lacunar infarct LEFT basal ganglia. No acute intracranial abnormalities. Advanced multilevel degenerative disc and facet disease changes of the cervical spine. Prior cervical spine fusion C3-C7. Chronic anterolisthesis at C2-C3. No acute cervical spine abnormalities. Extensive atherosclerotic disease changes. 2.7 cm diameter RIGHT thyroid nodule; further assessment by thyroid ultrasound is recommended unless contraindicated due to patient comorbidities or life expectancy. Aortic Atherosclerosis (ICD10-I70.0). Electronically Signed   By: Lavonia Dana M.D.   On: 02/15/2019 16:58   Ct Cervical Spine  Wo Contrast  Result Date: 02/15/2019 CLINICAL DATA:  Mechanical fall on Friday, worsening pain since fall, high clinical risk of cervical spine trauma EXAM: CT HEAD WITHOUT CONTRAST CT CERVICAL SPINE WITHOUT CONTRAST TECHNIQUE: Multidetector CT imaging of the head and cervical spine was performed following the standard protocol without intravenous contrast. Multiplanar CT image reconstructions of the cervical spine were also generated. COMPARISON:  CT head 10/25/2015 CT cervical spine 02/22/2009 FINDINGS: CT HEAD FINDINGS Brain: Generalized atrophy. Normal ventricular morphology. No midline shift or mass effect. Small vessel chronic ischemic changes of deep cerebral white matter. No intracranial hemorrhage, mass lesion, or evidence of acute infarction. No extra-axial fluid collections. Tiny old lacunar infarct LEFT basal ganglia. Vascular: Atherosclerotic calcifications of internal carotid and vertebral arteries at skull base Skull: Demineralized but intact Sinuses/Orbits: Clear Other: N/A CT CERVICAL SPINE FINDINGS Alignment: Chronic anterolisthesis at C2-C3 approximately 2.4 mm. Remaining alignments normal. Skull base and vertebrae: Osseous demineralization. Disc space narrowing C2-C3. Fusion of C3 C7. Anterior plate and screws [id number]. Additional disc space narrowing and endplate spur formation at T1-T2. Vertebral body heights maintained without fracture or additional subluxation. Multilevel facet degenerative changes. Skull base intact. Soft tissues and spinal canal: Prevertebral soft tissues normal thickness. Extensive atherosclerotic calcifications of the carotid and scattered vertebral arteries. Atherosclerotic calcification aortic arch. RIGHT thyroid nodule 2.7 cm diameter with additional isthmic nodule approximately 2.1 cm diameter. Disc levels:  No additional abnormalities Upper chest: Lung apices clear Other: N/A IMPRESSION: Atrophy with small vessel chronic ischemic changes  of deep cerebral white matter.  Old lacunar infarct LEFT basal ganglia. No acute intracranial abnormalities. Advanced multilevel degenerative disc and facet disease changes of the cervical spine. Prior cervical spine fusion C3-C7. Chronic anterolisthesis at C2-C3. No acute cervical spine abnormalities. Extensive atherosclerotic disease changes. 2.7 cm diameter RIGHT thyroid nodule; further assessment by thyroid ultrasound is recommended unless contraindicated due to patient comorbidities or life expectancy. Aortic Atherosclerosis (ICD10-I70.0). Electronically Signed   By: Lavonia Dana M.D.   On: 02/15/2019 16:58    Procedures Procedures (including critical care time)  Medications Ordered in ED Medications  oxyCODONE-acetaminophen (PERCOCET/ROXICET) 5-325 MG per tablet 1 tablet (1 tablet Oral Given 02/15/19 1329)  oxyCODONE-acetaminophen (PERCOCET/ROXICET) 5-325 MG per tablet 1 tablet (1 tablet Oral Given 02/15/19 1703)     Initial Impression / Assessment and Plan / ED Course  I have reviewed the triage vital signs and the nursing notes.  Pertinent labs & imaging results that were available during my care of the patient were reviewed by me and considered in my medical decision making (see chart for details).        Patient is an 85 year old female with history of physical exam as above presents to emergency room for evaluation of a mechanical fall that occurred 4 days ago.  Notably patient is on Plavix.  She has had pain in her sacral area after the fall for the past several days and she has some bruising in that area as well.  Due to her anticoagulation, labs were ordered including CBC, metabolic panel, coags.  CT scans were obtained including trauma pan scan including head, spine, chest abdomen and pelvis.  Imaging results pending at the time of transition of care to oncoming team at 1600.  Labs demonstrated a hyponatremia at 125.  Notably patient has had hyponatremia in the past although most recent sodium was 134 there are  previous documents sodiums from March in the 126 and 127 range.  Disposition pending based on the CT scans however she may require admission for hyponatremia.  Patient seen in conjunction with my attending physician Dr. Zenia Resides  Final Clinical Impressions(s) / ED Diagnoses   Final diagnoses:  Fall, initial encounter    ED Discharge Orders    None       Romona Curls, MD 02/15/19 1704    Lacretia Leigh, MD 02/17/19 1322

## 2019-02-15 NOTE — ED Provider Notes (Signed)
I saw and evaluated the patient, reviewed the resident's note and I agree with the findings and plan.  EKG:   85 year old female who is here for mechanical fall days ago.  She is on Plavix.  Complains of pain to her sacrum.  On exam she is neurologically intact in her lower extremities.  Will image and disposition based on   Lacretia Leigh, MD 02/15/19 1312

## 2019-02-15 NOTE — ED Notes (Signed)
Called pharm to get clonidine.

## 2019-02-15 NOTE — H&P (Addendum)
[name] [id number] DOB: 05-25-1931 DOA: 02/15/2019     PCP: Lavone Orn, MD   Outpatient Specialists:   CARDS:  Dr. Tamala Julian    Patient arrived to ER on 02/15/19 at 1257  Patient coming from:  From facility [facility]  Chief Complaint:  Chief Complaint  Patient presents with   Fall   Back Pain    HPI: [name] is a 85 y.o. female with medical history significant of DM2, CAD, HTN, GERD, Hyponatremia, left artery stenosis, chronic dizziness and frequent falls, CHF EF 45%,      Presented with   fall that occurred on Friday since then she has been having some low back pain. Back pain has been worse with walking.  She has not had any chest pain abdominal pain history to a bit inconsistent.  Patient feels that she tripped over a walker but when she was found on the floor she was slightly confused.  No history of seizure or seizure-like activity.  She is not quite sure how she fell down but thinks that she did not hit her head but rather that she popped down on her backside.  Patient has chronic dizziness for which she takes meclizine she have had falls in the past.  In fact she has been worked up by for bradycardia by cardiology   no bleeding, no chest pain  History of hyponatremia in the past sodium in March 2018 was down to 126  familt reports decreased PO intake No BM for past 3 days Infectious risk factors:  Reports confusion  In  ER RAPID COVID TEST  in house testing  Pending  No results found for: SARSCOV2NAA   Regarding pertinent Chronic problems:    Hyperlipidemia -  on statins Zocor   HTN on clonidine   CHF diastolic/systolic/ combined - last echo 2017   The estimated   ejection fraction was in the range of 45% to 50%. Diffuse   hypokinesis. Possible mild hypokinesis of the basal-midlateral   myocardium.   CAD  - On Aspirin, statin,   Plavix does not tolerate beta-blocker with bradycardia                -   followed by cardiology                 Renal artery stenosis - s/p left renal artery stenting (2017) On Plavix In June had repeat renal artery bilateral ultrasound showing good flow in the arteries but 70 to 99% stenosis in the celiac artery.     DM 2 -  Lab Results  Component Value Date   HGBA1C 6.5 (H) 02/04/2015   PO meds only,         CKD stage III - baseline Cr 1.2-1.4 Lab Results  Component Value Date   CREATININE 1.01 (H) 02/15/2019   CREATININE 1.42 (H) 09/21/2018   CREATININE 1.20 (H) 07/04/2016     While in ER: Initial work-up unremarkable except for sodium down to 125 Urine osmolarity  Osmolality, Ur 152Low     The following Work up has been ordered so far:  Orders Placed This Encounter  Procedures   SARS CORONAVIRUS 2 (TAT 6-24 HRS) Nasopharyngeal Nasopharyngeal Swab   CT Head Wo Contrast   CT Cervical Spine Wo Contrast   CT ABDOMEN PELVIS WO CONTRAST   CT CHEST WO CONTRAST   CT L-SPINE NO CHARGE   CT T-SPINE NO CHARGE   CBC with Differential  Comprehensive metabolic panel   Protime-INR   Osmolality   Osmolality, urine   Consult to hospitalist  ALL PATIENTS BEING ADMITTED/HAVING PROCEDURES NEED COVID-19 SCREENING   CBG monitoring, ED   EKG 12-Lead   EKG 12-Lead   EKG 12-Lead     Following Medications were ordered in ER: Medications  oxyCODONE-acetaminophen (PERCOCET/ROXICET) 5-325 MG per tablet 1 tablet (1 tablet Oral Given 02/15/19 1329)  oxyCODONE-acetaminophen (PERCOCET/ROXICET) 5-325 MG per tablet 1 tablet (1 tablet Oral Given 02/15/19 1703)        Consult Orders  (From admission, onward)         Start     Ordered   02/15/19 1818  Consult to hospitalist  ALL PATIENTS BEING ADMITTED/HAVING PROCEDURES NEED COVID-19 SCREENING  Once    Comments: ALL PATIENTS BEING ADMITTED/HAVING PROCEDURES NEED COVID-19 SCREENING  Provider:  (Not yet assigned)  Question Answer Comment  Place call to: [facility]   Reason for  Consult Admit      02/15/19 1817           Significant initial  Findings: Abnormal Labs Reviewed  CBC WITH DIFFERENTIAL/PLATELET - Abnormal; Notable for the following components:      Result Value   Hemoglobin 9.8 (*)    HCT 31.2 (*)    MCV 74.6 (*)    MCH 23.4 (*)    RDW 16.6 (*)    Platelets 472 (*)    All other components within normal limits  COMPREHENSIVE METABOLIC PANEL - Abnormal; Notable for the following components:   Sodium 125 (*)    Chloride 88 (*)    Glucose, Bld 121 (*)    Creatinine, Ser 1.01 (*)    GFR calc non Af Amer 51 (*)    GFR calc Af Amer 59 (*)    All other components within normal limits  OSMOLALITY - Abnormal; Notable for the following components:   Osmolality 258 (*)    All other components within normal limits  OSMOLALITY, URINE - Abnormal; Notable for the following components:   Osmolality, Ur 152 (*)    All other components within normal limits  CBG MONITORING, ED - Abnormal; Notable for the following components:   Glucose-Capillary 125 (*)    All other components within normal limits    Otherwise labs showing:    Recent Labs  Lab 02/15/19 1313  NA 125*  K 3.8  CO2 23  GLUCOSE 121*  BUN 11  CREATININE 1.01*  CALCIUM 9.1    Cr  Stable,  Lab Results  Component Value Date   CREATININE 1.01 (H) 02/15/2019   CREATININE 1.42 (H) 09/21/2018   CREATININE 1.20 (H) 07/04/2016    Recent Labs  Lab 02/15/19 1313  AST 23  ALT 17  ALKPHOS 68  BILITOT 0.9  PROT 6.5  ALBUMIN 3.6   Lab Results  Component Value Date   CALCIUM 9.1 02/15/2019     WBC      Component Value Date/Time   WBC 5.9 02/15/2019 1313   ANC    Component Value Date/Time   NEUTROABS 4.7 02/15/2019 1313   ALC No components found for: LYMPHAB    Plt: Lab Results  Component Value Date   PLT 472 (H) 02/15/2019      COVID-19 Labs   No results found for: SARSCOV2NAA   HG/HCT stable,      Component Value Date/Time   HGB 9.8 (L) 02/15/2019 1313     HCT 31.2 (L) 02/15/2019 1313    Troponin  25 Cardiac Panel (last 3 results) No results for input(s): CKTOTAL, CKMB, TROPONINI, RELINDX in the last 72 hours.     ECG: Ordered Personally reviewed by me showing: HR :84 Rhythm:  NSR,     no evidence of ischemic changes evidence of LVH QTC 443   BNP (last 3 results) Recent Labs    02/15/19 1945  BNP 1,115.9*     CBG (last 3)  Recent Labs    02/15/19 1256  GLUCAP 125*       UA no evidence of UTI   Urine analysis:    Component Value Date/Time   COLORURINE STRAW (A) 02/15/2019 1947   APPEARANCEUR CLEAR 02/15/2019 1947   LABSPEC 1.004 (L) 02/15/2019 1947   PHURINE 7.0 02/15/2019 1947   GLUCOSEU NEGATIVE 02/15/2019 1947   HGBUR SMALL (A) 02/15/2019 1947   BILIRUBINUR NEGATIVE 02/15/2019 1947   KETONESUR 5 (A) 02/15/2019 1947   PROTEINUR 100 (A) 02/15/2019 1947   UROBILINOGEN 0.2 02/26/2015 0812   NITRITE NEGATIVE 02/15/2019 1947   LEUKOCYTESUR NEGATIVE 02/15/2019 1947   Ordered  CT HEAD  NON acute    CTabd/pelvis / chest  -  features of bronchitis. Nodular soft tissue density within the breast with scattered macrocalcifications. Correlate with mammography Heterogeneous, nodular thickening of the right lobe thyroid gland with a larger 1.7 cm nodule extending into the thyroid isthmus. Consider further evaluation with thyroid ultrasound as clinically indicated.  Extensive coronary artery calcifications         ED Triage Vitals  Enc Vitals Group     BP 02/15/19 1259 (!) 168/73     Pulse Rate 02/15/19 1259 79     Resp 02/15/19 1259 (!) 22     Temp 02/15/19 1259 98.4 F (36.9 C)     Temp Source 02/15/19 1259 Oral     SpO2 02/15/19 1259 96 %     Weight --      Height --      Head Circumference --      Peak Flow --      Pain Score 02/15/19 1300 10     Pain Loc --      Pain Edu? --      Excl. in [place]? --   TMAX(24)@       Latest  Blood pressure (!) 170/94, pulse 81, temperature 98.4 F (36.9 C),  temperature source Oral, resp. rate 17, SpO2 97 %.     Hospitalist was called for admission for fall, possible syncope and hyponatremia   Review of Systems:    Pertinent positives include: fall, fatigue, back pain  Constitutional:  No weight loss, night sweats, Fevers, chills, weight loss  HEENT:  No headaches, Difficulty swallowing,Tooth/dental problems,Sore throat,  No sneezing, itching, ear ache, nasal congestion, post nasal drip,  Cardio-vascular:  No chest pain, Orthopnea, PND, anasarca, dizziness, palpitations.no Bilateral lower extremity swelling  GI:  No heartburn, indigestion, abdominal pain, nausea, vomiting, diarrhea, change in bowel habits, loss of appetite, melena, blood in stool, hematemesis Resp:  no shortness of breath at rest. No dyspnea on exertion, No excess mucus, no productive cough, No non-productive cough, No coughing up of blood.No change in color of mucus.No wheezing. Skin:  no rash or lesions. No jaundice GU:  no dysuria, change in color of urine, no urgency or frequency. No straining to urinate.  No flank pain.  Musculoskeletal:  No joint pain or no joint swelling. No decreased range of motion. No back pain.  Psych:  No change in  mood or affect. No depression or anxiety. No memory loss.  Neuro: no localizing neurological complaints, no tingling, no weakness, no double vision, no gait abnormality, no slurred speech, no confusion  All systems reviewed and apart from [place] all are negative  Past Medical History:   Past Medical History:  Diagnosis Date   Bradycardia    a. Coreg decreased due to HR low 40s on event monitor 2015-08-03 -> further decreased due to HR upper 30s in 01/2016.   CAD (coronary artery disease)    a. RV infarct 08/02/05 c/b high grade AV block with total occlusion of RCA, unable to treat with PCI. b. Nuc 10/2015: scar but no ischemia.   Carotid artery disease (HCC)    Chronic anemia    Chronic lower back pain    CKD (chronic kidney  disease), stage III    Complication of anesthesia    "almost died when I had my breast reduction"   Dyslipidemia    GERD (gastroesophageal reflux disease)    Heart murmur    High cholesterol    History of hiatal hernia    HTN (hypertension)    Hyperthyroidism    Meniere disease    "for years" takes Antivert 3 times per day (01/31/2016)   Myocardial infarction [facility]) 2005-08-02   "almost died"   Near syncope    Osteoporosis    Renal artery stenosis ([place])    a. left renal artery stent 08-03-2010. b. s/p repeat left renal stenting 01/2016.   Stroke [facility])    a. Prior infarcts seen on head CT.   Type II diabetes mellitus ([place])    "controlled w/diet and exercise only" (01/31/2016)      Past Surgical History:  Procedure Laterality Date   ANTERIOR CERVICAL DECOMP/DISCECTOMY FUSION  02/2002   Archie Endo 08/26/2010   APPENDECTOMY     BACK SURGERY     BLADDER SUSPENSION  x3   BREAST LUMPECTOMY  11/2007   breast needle-localized lumpectomy/notes 08/12/2012   CARDIAC CATHETERIZATION  02-Aug-2005   "couldn't get stents in; I almost died'   CATARACT EXTRACTION W/ INTRAOCULAR LENS  IMPLANT, BILATERAL Bilateral    CATARACT EXTRACTION W/ INTRAOCULAR LENS  IMPLANT, BILATERAL Bilateral    CHOLECYSTECTOMY OPEN     FRACTURE SURGERY     HIP FRACTURE SURGERY Right 02-Aug-2009   "added rod in my leg and a ball"   KYPHOPLASTY N/A 03/20/2015   Procedure: THORACIC TWELVE KYPHOPLASTY;  Surgeon: Jovita Gamma, MD;  Location: [place] [facility] OR[facility];  Service: Neurosurgery;  Laterality: N/A;   KYPHOPLASTY N/A 04/23/2015   Procedure: KYPHOPLASTY - LUMBAR ONE;  Surgeon: Jovita Gamma, MD;  Location: [place] NEUR[facility] OR[facility];  Service: Neurosurgery;  Laterality: N/A;  L1 Kyphoplasty   NECK HARDWARE REMOVAL  11/2002   "replaced screw"   PERIPHERAL VASCULAR CATHETERIZATION Left 12/20/2014   Procedure: Renal Angiography;  Surgeon: Wellington Hampshire, MD;  Location: [place] CV LAB;  Service: Cardiovascular;  Laterality: Left;    PERIPHERAL VASCULAR CATHETERIZATION N/A 01/31/2016   Procedure: Renal Angiography;  Surgeon: Lorretta Harp, MD;  Location: [place] CV LAB;  Service: Cardiovascular;  Laterality: N/A;   PERIPHERAL VASCULAR CATHETERIZATION N/A 01/31/2016   Procedure: Abdominal Aortogram;  Surgeon: Lorretta Harp, MD;  Location: [place] CV LAB;  Service: Cardiovascular;  Laterality: N/A;   PERIPHERAL VASCULAR CATHETERIZATION  01/31/2016   Procedure: Peripheral Vascular Intervention;  Surgeon: Lorretta Harp, MD;  Location: [place] CV LAB;  Service: Cardiovascular;;   REDUCTION MAMMAPLASTY Bilateral  1985   renal arteriogram  12/2014   occluded previous Lt renal stent and patent rt renal artery.   RENAL ARTERY STENT Left 2012   TONSILLECTOMY     TUBAL LIGATION     VAGINAL HYSTERECTOMY  ~ 1995   WRIST FRACTURE SURGERY Left 2012    Social History:  Ambulatory  walker       reports that she has quit smoking. Her smoking use included cigarettes. She has a 30.00 pack-year smoking history. She has never used smokeless tobacco. She reports that she does not drink alcohol or use drugs.     Family History:   Family History  Problem Relation Age of Onset   Diabetes type II Mother    Heart attack Mother    Heart disease Mother    Congestive Heart Failure Father    Pulmonary embolism Father    Alzheimer's disease Sister    Heart disease Sister    Heart disease Brother    Healthy Sister    Healthy Sister    Stroke Brother    Dementia Brother        VASCULAR   Stroke Brother    Diabetes type II Brother    Other Brother        KILLED IN PLANE CRASH   Pulmonary disease Sister    Heart attack Sister    Kidney failure Sister    Diabetes type II Sister    Diabetes type II Sister    Kidney failure Sister     Allergies: Allergies  Allergen Reactions   Fentanyl Anaphylaxis    Cardiac arrest and coded   Fish Allergy Anaphylaxis and Rash   Morphine And  Related Anaphylaxis    Cardiac arrest and coded Per Dr. Conley Canal patient has taken percocet without issue   Singulair [Montelukast] Palpitations and Other (See Comments)    Increased BP and HR   Actonel [Risedronate Sodium] Rash and Other (See Comments)    weakness   Dilaudid [Hydromorphone Hcl] Rash and Other (See Comments)    Crying and screaming   Fosamax [Alendronate Sodium] Rash and Other (See Comments)    Weakness and myalgias   Hydralazine Diarrhea and Other (See Comments)    Swelling in stomach   Lipitor [Atorvastatin] Swelling and Rash    Tongue swelling    Lisinopril Diarrhea, Rash and Cough   Micardis [Telmisartan] Diarrhea and Rash   Septra [Sulfamethoxazole-Trimethoprim] Nausea And Vomiting and Rash   Tekturna [Aliskiren] Swelling and Rash    Swelling of tongue   Tricor [Fenofibrate] Rash and Other (See Comments)    Flu symptoms   Imdur [Isosorbide Dinitrate] Other (See Comments)    Headaches and blindness   Amlodipine Diarrhea   Avapro [Irbesartan] Rash   Cardio Complete [Nutritional Supplements] Rash   Cardizem [Diltiazem Hcl] Rash   Ciprofloxacin Rash and Other (See Comments)    Black spot on body   Codeine Rash   Cyclobenzaprine Nausea Only   Forteo [Parathyroid Hormone (Recomb)] Rash   Inapsine [Droperidol] Rash   Ivp Dye [Iodinated Diagnostic Agents] Rash   Shellfish Allergy Rash   Tussionex Pennkinetic Er [Hydrocod Polst-Cpm Polst Er] Itching and Rash     Prior to Admission medications   Medication Sig Start Date End Date Taking? Authorizing Provider  acetaminophen (TYLENOL) 500 MG tablet Take 1,000 mg by mouth 2 (two) times daily.     [provider]  aspirin 81 MG tablet Take 1 tablet (81 mg total) by mouth daily. 02/01/16  Dunn, Dayna N, PA-C  azelastine (ASTELIN) 0.1 % nasal spray Place 1-2 sprays into both nostrils daily as needed. For allergies/congestion. 12/14/15   [provider]  betamethasone  dipropionate (DIPROLENE) 0.05 % cream  12/10/18   [provider]  betamethasone valerate lotion (VALISONE) 0.1 % Apply 1 application topically 2 (two) times daily as needed. For irritated skin. 12/06/15   [provider]  cholecalciferol (VITAMIN D) 1000 UNITS tablet Take 2,000 Units by mouth daily.     [provider]  cloNIDine (CATAPRES) 0.1 MG tablet TAKE 1 TABLET BY MOUTH EVERY MORNING AND 2 TABLETS IN THE EVENING 10/17/16   Lorretta Harp, MD  clopidogrel (PLAVIX) 75 MG tablet Take 1 tablet (75 mg total) by mouth daily. Please make overdue appt for future refills. [phone]. 2nd attempt. 04/20/18   Belva Crome, MD  denosumab (PROLIA) 60 MG/ML SOLN injection Inject 60 mg into the skin every 6 (six) months. Administer in upper arm, thigh, or abdomen    [provider]  fexofenadine (ALLEGRA) 180 MG tablet Take 180 mg by mouth daily.    [provider]  FLUZONE HIGH-DOSE QUADRIVALENT 0.7 ML SUSY TO BE ADMINISTERED BY PHARMACIST FOR IMMUNIZATION 12/06/18   [provider]  glimepiride (AMARYL) 1 MG tablet TAKE 1/2 TABLET BY MOUTH ONCE A DAY 09/21/18   [provider]  metFORMIN (GLUCOPHAGE-XR) 500 MG 24 hr tablet TAKE 1 TABLET BY MOUTH EVERY DAY IN THE EVENING WITH FOOD 11/28/18   [provider]  methimazole (TAPAZOLE) 5 MG tablet Take 5 mg by mouth daily. 08/27/15   [provider]  nitroGLYCERIN (NITROSTAT) 0.4 MG SL tablet Place 0.4 mg under the tongue every 5 (five) minutes as needed for chest pain (3 doses MAX).    [provider]  OneTouch Delica Lancets [id number] MISC USE AS DIRECTED TO CHECK BLOOD SUGAR DAILY AND AS NEEDED E11.9 12/04/18   [provider]  ONETOUCH ULTRA test strip USE AS DIRECTED TO CHECK BLOOD SUGARS DAILY AS NEEDED 11/19/18   [provider]  pantoprazole (PROTONIX) 40 MG tablet TAKE 1 TABLET BY MOUTH TWICE A DAY BEFORE A MEAL 03/24/16   Dunn, Dayna N, PA-C  QUEtiapine  (SEROQUEL) 25 MG tablet  12/13/18   [provider]  simvastatin (ZOCOR) 20 MG tablet Take 20 mg by mouth 3 (three) times a week. Monday, Wednesday and Friday    [provider]   Physical Exam: Blood pressure (!) 170/94, pulse 81, temperature 98.4 F (36.9 C), temperature source Oral, resp. rate 17, SpO2 97 %. 1. General:  in No  Acute distress   Chronically ill  -appearing 2. Psychological: Alert and  Oriented 3. Head/ENT:    Dry Mucous Membranes                          Head Non traumatic, neck supple                           Poor Dentition 4. SKIN:  decreased Skin turgor,  Skin clean Dry and intact no rash 5. Heart: Regular rate and rhythm no Murmur, no Rub or gallop 6. Lungs:   no wheezes or crackles   7. Abdomen: Soft,  non-tender, slightly, distended  bowel sounds present 8. Lower extremities: no clubbing, cyanosis, no edema 9. Neurologically Grossly intact, moving all 4 extremities equally  10. MSK: Normal range of motion  All other LABS:     Recent Labs  Lab 02/15/19 1313  WBC 5.9  NEUTROABS 4.7  HGB 9.8*  HCT 31.2*  MCV 74.6*  PLT 472*     Recent Labs  Lab 02/15/19 1313  NA 125*  K 3.8  CL 88*  CO2 23  GLUCOSE 121*  BUN 11  CREATININE 1.01*  CALCIUM 9.1     Recent Labs  Lab 02/15/19 1313  AST 23  ALT 17  ALKPHOS 68  BILITOT 0.9  PROT 6.5  ALBUMIN 3.6       Cultures:    Component Value Date/Time   SDES URINE, RANDOM 02/26/2015 1226   SPECREQUEST Normal 02/26/2015 1226   CULT MULTIPLE SPECIES PRESENT, SUGGEST RECOLLECTION 02/26/2015 1226   REPTSTATUS 02/27/2015 FINAL 02/26/2015 1226     Radiological Exams on Admission: Ct Abdomen Pelvis Wo Contrast  Result Date: 02/15/2019 CLINICAL DATA:  Mechanical fall with lower back pain, worsening since fall EXAM: CT CHEST, ABDOMEN AND PELVIS WITHOUT CONTRAST TECHNIQUE: Multidetector CT imaging of the chest, abdomen and pelvis was performed following the standard protocol without  IV contrast. COMPARISON:  CT abdomen pelvis January 30, 2015 FINDINGS: CT CHEST FINDINGS Cardiovascular: Mild cardiomegaly. Dense mitral annular calcification. Extensive atherosclerotic calcification of the coronary arteries. Trace pericardial fluid is likely within physiologic normal. Extensive calcifications of the aortic leaflets as well as atherosclerotic calcification throughout the aortic arch. Normal 3 vessel branching pattern of the arch with proximal great vessel calcifications as well. No periaortic stranding. No abnormal mural thickening or plaque displacement to suggest intramural hematoma. Mediastinum/Nodes: No mediastinal hematoma or pneumomediastinum. No acute traumatic abnormality of the trachea or esophagus. Small hiatal hernia. Heterogeneous, nodular thickening of the right lobe thyroid gland with a larger 1.7 cm nodule extending into the thyroid isthmus. Thoracic inlet is otherwise unremarkable. No mediastinal or axillary adenopathy. Hilar adenopathy is poorly assessed in the absence contrast media. Lungs/Pleura: Few areas of linear scarring and/or atelectasis are present in the anterior right upper lobe, and both lung bases. Scattered sub 5 mm nodules are present throughout the lungs. Largest is a solid 5 mm nodule noted in the periphery of the right upper lobe (5/47). Additional 3 mm solid nodule in the apex of the left upper lobe (5/22). Paired sub 3 mm solid nodules are present in the right lung base (5/122, 123). Mild central airways thickening and scattered secretions. Mild interlobular septal thickening in the apices and bases. Dependent atelectasis posteriorly. No pneumothorax or effusion. Musculoskeletal: Nodular soft tissue density within the breast with scattered macrocalcifications, nonspecific. Multiple partially healed left-sided rib fractures including and anterolateral left fifth through ninth ribs with additional posterolateral left ninth through eleventh rib fractures as well.  Dedicated thoracic spine reconstructions were generated. Please see that report. CT ABDOMEN PELVIS FINDINGS Hepatobiliary: No visible hepatic injury or perihepatic hematoma. No suspicious hepatic lesions cholecystectomy clips are present the gallbladder fossa. Mild biliary ductal dilatation may be related to a combination of age and reservoir effect. No calcified intraductal gallstones. Pancreas: Unremarkable. No pancreatic ductal dilatation or surrounding inflammatory changes. Spleen: No visible splenic injury or perisplenic hematoma. Dense splenic artery calcifications in the hilum. Adrenals/Urinary Tract: Nodular thickening of the adrenal glands is similar to comparison studies. Mild bilateral renal atrophy with multiple exophytic appearing cystic lesions in both kidneys, several subcentimeter hyperattenuating structures arising from the upper pole right kidney are indeterminate on this non-contrast CT study. No hydronephrosis or urolithiasis. Urinary bladder is distended but otherwise unremarkable. Stomach/Bowel: Small  hiatal hernia. Stomach is unremarkable. Small duodenal diverticula are noted (3/66). No small bowel dilatation or wall thickening. The appendix is surgically absent. No colonic dilatation or wall thickening. Scattered colonic diverticula without focal pericolonic inflammation to suggest diverticulitis. No evidence of mesenteric hematoma or contusion. Vascular/Lymphatic: Vascular evaluation limited in the absence of contrast. Extensive atherosclerotic calcification of the aorta and branch vessels. No aneurysm or ectasia. Reproductive: Uterus is surgically absent. No concerning adnexal lesions. Other: No body wall hematoma. Mild soft tissue edema noted posteriorly with skin thickening superficial to the sacrum. Stable soft tissue calcification in the low right anterior abdominal wall. Possibly postsurgical in nature with additional features of prior incision. Musculoskeletal: Prior right femoral  intramedullary nail placement with transcervical fixation screw. Dedicated lumbar spine reconstructions were generated indicated separately. Please see that report. Bones of the pelvis remain congruent. Moderate degenerative changes noted in both hips. Mild degenerative change in the SI joints. No suspicious osseous lesions. No suspicious sclerotic changes of the posterior sacral arch. IMPRESSION: Traumatic Findings: 1. Multiple partially healed left-sided rib fractures as described above. Overall appearance is subacute to chronic in nature though could correlate for point tenderness to assess for acute injury. 2. No other traumatic findings in the chest, abdomen or pelvis. 3. Dedicated thoracic spine and lumbar reconstructions were generated and reported separately. Please see dedicated report. Nontraumatic Findings: 1. Scattered airways thickening and secretions in the lungs, may reflect features of bronchitis. 2. Multiple solid sub 5 mm nodules are present throughout both lungs. Appearance largely favors post infectious or inflammatory change. No follow-up needed if patient is low-risk (and has no known or suspected primary neoplasm). Non-contrast chest CT can be considered in 12 months if patient is high-risk. This recommendation follows the consensus statement: Guidelines for Management of Incidental Pulmonary Nodules Detected on CT Images: From the Fleischner Society 2017; Radiology 2017; 284:228-243. 3. Nodular soft tissue density within the breast with scattered macrocalcifications. Correlate with mammography 4. Heterogeneous, nodular thickening of the right lobe thyroid gland with a larger 1.7 cm nodule extending into the thyroid isthmus. Consider further evaluation with thyroid ultrasound as clinically indicated. This follows consensus guidelines: Managing Incidental Thyroid Nodules Detected on Imaging: White Paper of the ACR Incidental Thyroid Findings Committee. J Am Coll Radiol 2015; 12:143-150. and  [facility] 3-tiered system for managing ITNs: J Am Coll Radiol. 2015; Feb;12(2): 143-50 5. Aortic Atherosclerosis (ICD10-I70.0). 6. Extensive coronary artery calcifications and mild cardiomegaly. Mitral annular calcification noted as well. 7. Skin thickening superficial to the posterior sacrum. Correlate with visual inspection assess for decubitus skin breakdown. 8. Multiple bilateral renal cysts and mild renal atrophy. Several subcentimeter hyperattenuating cystic foci seen in the upper pole right kidney. Could consider further evaluation with dedicated renal imaging on a nonemergent, outpatient basis if felt to be clinically warranted. Electronically Signed   By: Lovena Le M.D.   On: 02/15/2019 17:14   Ct Head Wo Contrast  Result Date: 02/15/2019 CLINICAL DATA:  Mechanical fall on Friday, worsening pain since fall, high clinical risk of cervical spine trauma EXAM: CT HEAD WITHOUT CONTRAST CT CERVICAL SPINE WITHOUT CONTRAST TECHNIQUE: Multidetector CT imaging of the head and cervical spine was performed following the standard protocol without intravenous contrast. Multiplanar CT image reconstructions of the cervical spine were also generated. COMPARISON:  CT head 10/25/2015 CT cervical spine 02/22/2009 FINDINGS: CT HEAD FINDINGS Brain: Generalized atrophy. Normal ventricular morphology. No midline shift or mass effect. Small vessel chronic ischemic changes of deep cerebral white matter. No intracranial  hemorrhage, mass lesion, or evidence of acute infarction. No extra-axial fluid collections. Tiny old lacunar infarct LEFT basal ganglia. Vascular: Atherosclerotic calcifications of internal carotid and vertebral arteries at skull base Skull: Demineralized but intact Sinuses/Orbits: Clear Other: N/A CT CERVICAL SPINE FINDINGS Alignment: Chronic anterolisthesis at C2-C3 approximately 2.4 mm. Remaining alignments normal. Skull base and vertebrae: Osseous demineralization. Disc space narrowing C2-C3. Fusion of C3 C7.  Anterior plate and screws [id number]. Additional disc space narrowing and endplate spur formation at T1-T2. Vertebral body heights maintained without fracture or additional subluxation. Multilevel facet degenerative changes. Skull base intact. Soft tissues and spinal canal: Prevertebral soft tissues normal thickness. Extensive atherosclerotic calcifications of the carotid and scattered vertebral arteries. Atherosclerotic calcification aortic arch. RIGHT thyroid nodule 2.7 cm diameter with additional isthmic nodule approximately 2.1 cm diameter. Disc levels:  No additional abnormalities Upper chest: Lung apices clear Other: N/A IMPRESSION: Atrophy with small vessel chronic ischemic changes of deep cerebral white matter. Old lacunar infarct LEFT basal ganglia. No acute intracranial abnormalities. Advanced multilevel degenerative disc and facet disease changes of the cervical spine. Prior cervical spine fusion C3-C7. Chronic anterolisthesis at C2-C3. No acute cervical spine abnormalities. Extensive atherosclerotic disease changes. 2.7 cm diameter RIGHT thyroid nodule; further assessment by thyroid ultrasound is recommended unless contraindicated due to patient comorbidities or life expectancy. Aortic Atherosclerosis (ICD10-I70.0). Electronically Signed   By: Lavonia Dana M.D.   On: 02/15/2019 16:58   Ct Chest Wo Contrast  Result Date: 02/15/2019 CLINICAL DATA:  Mechanical fall with lower back pain, worsening since fall EXAM: CT CHEST, ABDOMEN AND PELVIS WITHOUT CONTRAST TECHNIQUE: Multidetector CT imaging of the chest, abdomen and pelvis was performed following the standard protocol without IV contrast. COMPARISON:  CT abdomen pelvis January 30, 2015 FINDINGS: CT CHEST FINDINGS Cardiovascular: Mild cardiomegaly. Dense mitral annular calcification. Extensive atherosclerotic calcification of the coronary arteries. Trace pericardial fluid is likely within physiologic normal. Extensive calcifications of the aortic leaflets  as well as atherosclerotic calcification throughout the aortic arch. Normal 3 vessel branching pattern of the arch with proximal great vessel calcifications as well. No periaortic stranding. No abnormal mural thickening or plaque displacement to suggest intramural hematoma. Mediastinum/Nodes: No mediastinal hematoma or pneumomediastinum. No acute traumatic abnormality of the trachea or esophagus. Small hiatal hernia. Heterogeneous, nodular thickening of the right lobe thyroid gland with a larger 1.7 cm nodule extending into the thyroid isthmus. Thoracic inlet is otherwise unremarkable. No mediastinal or axillary adenopathy. Hilar adenopathy is poorly assessed in the absence contrast media. Lungs/Pleura: Few areas of linear scarring and/or atelectasis are present in the anterior right upper lobe, and both lung bases. Scattered sub 5 mm nodules are present throughout the lungs. Largest is a solid 5 mm nodule noted in the periphery of the right upper lobe (5/47). Additional 3 mm solid nodule in the apex of the left upper lobe (5/22). Paired sub 3 mm solid nodules are present in the right lung base (5/122, 123). Mild central airways thickening and scattered secretions. Mild interlobular septal thickening in the apices and bases. Dependent atelectasis posteriorly. No pneumothorax or effusion. Musculoskeletal: Nodular soft tissue density within the breast with scattered macrocalcifications, nonspecific. Multiple partially healed left-sided rib fractures including and anterolateral left fifth through ninth ribs with additional posterolateral left ninth through eleventh rib fractures as well. Dedicated thoracic spine reconstructions were generated. Please see that report. CT ABDOMEN PELVIS FINDINGS Hepatobiliary: No visible hepatic injury or perihepatic hematoma. No suspicious hepatic lesions cholecystectomy clips are present the gallbladder fossa. Mild biliary  ductal dilatation may be related to a combination of age and  reservoir effect. No calcified intraductal gallstones. Pancreas: Unremarkable. No pancreatic ductal dilatation or surrounding inflammatory changes. Spleen: No visible splenic injury or perisplenic hematoma. Dense splenic artery calcifications in the hilum. Adrenals/Urinary Tract: Nodular thickening of the adrenal glands is similar to comparison studies. Mild bilateral renal atrophy with multiple exophytic appearing cystic lesions in both kidneys, several subcentimeter hyperattenuating structures arising from the upper pole right kidney are indeterminate on this non-contrast CT study. No hydronephrosis or urolithiasis. Urinary bladder is distended but otherwise unremarkable. Stomach/Bowel: Small hiatal hernia. Stomach is unremarkable. Small duodenal diverticula are noted (3/66). No small bowel dilatation or wall thickening. The appendix is surgically absent. No colonic dilatation or wall thickening. Scattered colonic diverticula without focal pericolonic inflammation to suggest diverticulitis. No evidence of mesenteric hematoma or contusion. Vascular/Lymphatic: Vascular evaluation limited in the absence of contrast. Extensive atherosclerotic calcification of the aorta and branch vessels. No aneurysm or ectasia. Reproductive: Uterus is surgically absent. No concerning adnexal lesions. Other: No body wall hematoma. Mild soft tissue edema noted posteriorly with skin thickening superficial to the sacrum. Stable soft tissue calcification in the low right anterior abdominal wall. Possibly postsurgical in nature with additional features of prior incision. Musculoskeletal: Prior right femoral intramedullary nail placement with transcervical fixation screw. Dedicated lumbar spine reconstructions were generated indicated separately. Please see that report. Bones of the pelvis remain congruent. Moderate degenerative changes noted in both hips. Mild degenerative change in the SI joints. No suspicious osseous lesions. No  suspicious sclerotic changes of the posterior sacral arch. IMPRESSION: Traumatic Findings: 1. Multiple partially healed left-sided rib fractures as described above. Overall appearance is subacute to chronic in nature though could correlate for point tenderness to assess for acute injury. 2. No other traumatic findings in the chest, abdomen or pelvis. 3. Dedicated thoracic spine and lumbar reconstructions were generated and reported separately. Please see dedicated report. Nontraumatic Findings: 1. Scattered airways thickening and secretions in the lungs, may reflect features of bronchitis. 2. Multiple solid sub 5 mm nodules are present throughout both lungs. Appearance largely favors post infectious or inflammatory change. No follow-up needed if patient is low-risk (and has no known or suspected primary neoplasm). Non-contrast chest CT can be considered in 12 months if patient is high-risk. This recommendation follows the consensus statement: Guidelines for Management of Incidental Pulmonary Nodules Detected on CT Images: From the Fleischner Society 2017; Radiology 2017; 284:228-243. 3. Nodular soft tissue density within the breast with scattered macrocalcifications. Correlate with mammography 4. Heterogeneous, nodular thickening of the right lobe thyroid gland with a larger 1.7 cm nodule extending into the thyroid isthmus. Consider further evaluation with thyroid ultrasound as clinically indicated. This follows consensus guidelines: Managing Incidental Thyroid Nodules Detected on Imaging: White Paper of the ACR Incidental Thyroid Findings Committee. J Am Coll Radiol 2015; 12:143-150. and [facility] 3-tiered system for managing ITNs: J Am Coll Radiol. 2015; Feb;12(2): 143-50 5. Aortic Atherosclerosis (ICD10-I70.0). 6. Extensive coronary artery calcifications and mild cardiomegaly. Mitral annular calcification noted as well. 7. Skin thickening superficial to the posterior sacrum. Correlate with visual inspection assess for  decubitus skin breakdown. 8. Multiple bilateral renal cysts and mild renal atrophy. Several subcentimeter hyperattenuating cystic foci seen in the upper pole right kidney. Could consider further evaluation with dedicated renal imaging on a nonemergent, outpatient basis if felt to be clinically warranted. Electronically Signed   By: Lovena Le M.D.   On: 02/15/2019 17:14   Ct Cervical Spine  Wo Contrast  Result Date: 02/15/2019 CLINICAL DATA:  Mechanical fall on Friday, worsening pain since fall, high clinical risk of cervical spine trauma EXAM: CT HEAD WITHOUT CONTRAST CT CERVICAL SPINE WITHOUT CONTRAST TECHNIQUE: Multidetector CT imaging of the head and cervical spine was performed following the standard protocol without intravenous contrast. Multiplanar CT image reconstructions of the cervical spine were also generated. COMPARISON:  CT head 10/25/2015 CT cervical spine 02/22/2009 FINDINGS: CT HEAD FINDINGS Brain: Generalized atrophy. Normal ventricular morphology. No midline shift or mass effect. Small vessel chronic ischemic changes of deep cerebral white matter. No intracranial hemorrhage, mass lesion, or evidence of acute infarction. No extra-axial fluid collections. Tiny old lacunar infarct LEFT basal ganglia. Vascular: Atherosclerotic calcifications of internal carotid and vertebral arteries at skull base Skull: Demineralized but intact Sinuses/Orbits: Clear Other: N/A CT CERVICAL SPINE FINDINGS Alignment: Chronic anterolisthesis at C2-C3 approximately 2.4 mm. Remaining alignments normal. Skull base and vertebrae: Osseous demineralization. Disc space narrowing C2-C3. Fusion of C3 C7. Anterior plate and screws [id number]. Additional disc space narrowing and endplate spur formation at T1-T2. Vertebral body heights maintained without fracture or additional subluxation. Multilevel facet degenerative changes. Skull base intact. Soft tissues and spinal canal: Prevertebral soft tissues normal thickness. Extensive  atherosclerotic calcifications of the carotid and scattered vertebral arteries. Atherosclerotic calcification aortic arch. RIGHT thyroid nodule 2.7 cm diameter with additional isthmic nodule approximately 2.1 cm diameter. Disc levels:  No additional abnormalities Upper chest: Lung apices clear Other: N/A IMPRESSION: Atrophy with small vessel chronic ischemic changes of deep cerebral white matter. Old lacunar infarct LEFT basal ganglia. No acute intracranial abnormalities. Advanced multilevel degenerative disc and facet disease changes of the cervical spine. Prior cervical spine fusion C3-C7. Chronic anterolisthesis at C2-C3. No acute cervical spine abnormalities. Extensive atherosclerotic disease changes. 2.7 cm diameter RIGHT thyroid nodule; further assessment by thyroid ultrasound is recommended unless contraindicated due to patient comorbidities or life expectancy. Aortic Atherosclerosis (ICD10-I70.0). Electronically Signed   By: Lavonia Dana M.D.   On: 02/15/2019 16:58   Ct T-spine No Charge  Result Date: 02/15/2019 CLINICAL DATA:  Mechanical fall, lower back pain EXAM: CT THORACIC AND LUMBAR SPINE WITHOUT CONTRAST TECHNIQUE: Multiplanar CT reconstructions of the thoracic and lumbar spine were generated from the same day CT of the chest, abdomen and pelvis. No contrast was administered for this exam. COMPARISON:  Lumbar spine radiograph 04/23/2015, chest radiograph 06/15/2016, thoracolumbar radiograph 03/20/2015, CT abdomen pelvis 01/30/2015, lumbar MR 04/13/2015 FINDINGS: CT THORACIC SPINE FINDINGS Alignment: Preservation of normal thoracic kyphosis. No traumatic listhesis. Posterior elements remain normally aligned. Vertebrae: Methylmethacrylate cement noted in the T12 vertebral body with a grossly stable compression deformity of the vertebrae when compared to prior thoracolumbar spinal imaging from 2017. Prior cervical fixation hardware is partially imaged on this examination. Paraspinal and other soft  tissues: No paraspinal fluid or swelling. No visible canal hematoma. Disc levels: Multilevel discogenic and facet degenerative changes are present throughout the thoracic spine. Features are most pronounced at the T1-2 level with sclerotic endplate changes. Posterior disc osteophyte complexes are present at the T1-2 level as well as T7-T8 which efface the ventral thecal sac and results in at most mild canal stenosis. Posterior spurring and facet hypertrophy at the T1-2 level also result in mild-to-moderate bilateral foraminal narrowing at this level. No other significant neural foraminal narrowing in the thoracic spine. CT LUMBAR SPINE FINDINGS Segmentation: 5 lumbar type vertebral bodies are identified. Alignment: Dextrocurvature of the upper lumbar spine apex at L1-2 with levocurvature of the lower  lumbar spine apex at L4-5. No acute traumatic listhesis. Mild degenerative right lateral listhesis of L3 on L4. Curvature is stable from prior studies. Vertebrae: The osseous structures appear diffusely demineralized which may limit detection of small or nondisplaced fractures. Remote compression deformity of the L1 vertebral body post vertebral augmentation with methylmethacrylate cement in the L1 level. Appearance is grossly similar to comparison radiography from 2017. Most recent cross-sectional comparison (December of 2016) was before placement of this augmentation. Posterior elements and transverse processes are intact. No suspicious osseous lesions are seen. Paraspinal and other soft tissues: No acute paravertebral swelling or fluid. No visible canal hematoma Disc levels: Level by level evaluation of the lumbar spine below: T12-L1: Compression deformities at T11 and T12 with 5 mm of retropulsion of the inferior T12 endplate resulting in mild canal stenosis at this level. Severe left and moderate right foraminal narrowing L1-L2: Near complete disc height loss with vacuum phenomenon and posterior spurring with mild  canal stenosis and moderate bilateral foraminal narrowing L2-L3: Near complete disc height loss with vacuum phenomenon and posterior spurring with facet hypertrophic changes. Global disc bulge with a right central protrusion. Mild canal stenosis. Mild bilateral foraminal narrowing. L3-L4: Severe disc height loss with global disc bulge and moderate posterior facet hypertrophy with mild canal stenosis and moderate bilateral foraminal narrowing. L4-L5: Near complete disc height loss with global disc bulge, superimposed right central and subarticular disc protrusion. Posterior ligamentum flavum infolding. Moderate facet hypertrophic changes. Moderate canal stenosis. Moderate left and severe right neural foraminal narrowing. L5-S1: Complete disc height loss with vacuum phenomenon. Global disc bulge. Severe facet hypertrophy. Foci of gas or lata about the left articular facet. Changes result in moderate canal stenosis and severe bilateral foraminal narrowing with additional narrowing of the left subarticular recess. IMPRESSION: 1. No definite acute traumatic injury of the thoracic or lumbar spine. 2. Remote compression deformity of the T12 vertebral body post augmentation with 40% residual anterior height loss. Appearance is similar to comparison radiography and MR imaging. 3. Remote compression deformity of the L1 vertebral body post augmentation with approximately 20% residual height loss anteriorly. Appearance is similar to comparison radiographs. 4. If there is concern for acute injury upon the more remote deformity at the augmented levels, MRI could be obtained to assess for edematous changes. 5. Multilevel degenerative changes throughout the thoracic and lumbar spine as described above. Features are most pronounced in the lower lumbar levels with multilevel moderate canal stenoses and moderate to severe foraminal narrowing. 6. For findings in the included portions of the chest, abdomen and pelvis. Please see  dedicated report for the studies from which this exam is reconstructed. Electronically Signed   By: Lovena Le M.D.   On: 02/15/2019 17:44   Ct L-spine No Charge  Result Date: 02/15/2019 CLINICAL DATA:  Mechanical fall, lower back pain EXAM: CT THORACIC AND LUMBAR SPINE WITHOUT CONTRAST TECHNIQUE: Multiplanar CT reconstructions of the thoracic and lumbar spine were generated from the same day CT of the chest, abdomen and pelvis. No contrast was administered for this exam. COMPARISON:  Lumbar spine radiograph 04/23/2015, chest radiograph 06/15/2016, thoracolumbar radiograph 03/20/2015, CT abdomen pelvis 01/30/2015, lumbar MR 04/13/2015 FINDINGS: CT THORACIC SPINE FINDINGS Alignment: Preservation of normal thoracic kyphosis. No traumatic listhesis. Posterior elements remain normally aligned. Vertebrae: Methylmethacrylate cement noted in the T12 vertebral body with a grossly stable compression deformity of the vertebrae when compared to prior thoracolumbar spinal imaging from 2017. Prior cervical fixation hardware is partially imaged on this examination.  Paraspinal and other soft tissues: No paraspinal fluid or swelling. No visible canal hematoma. Disc levels: Multilevel discogenic and facet degenerative changes are present throughout the thoracic spine. Features are most pronounced at the T1-2 level with sclerotic endplate changes. Posterior disc osteophyte complexes are present at the T1-2 level as well as T7-T8 which efface the ventral thecal sac and results in at most mild canal stenosis. Posterior spurring and facet hypertrophy at the T1-2 level also result in mild-to-moderate bilateral foraminal narrowing at this level. No other significant neural foraminal narrowing in the thoracic spine. CT LUMBAR SPINE FINDINGS Segmentation: 5 lumbar type vertebral bodies are identified. Alignment: Dextrocurvature of the upper lumbar spine apex at L1-2 with levocurvature of the lower lumbar spine apex at L4-5. No acute  traumatic listhesis. Mild degenerative right lateral listhesis of L3 on L4. Curvature is stable from prior studies. Vertebrae: The osseous structures appear diffusely demineralized which may limit detection of small or nondisplaced fractures. Remote compression deformity of the L1 vertebral body post vertebral augmentation with methylmethacrylate cement in the L1 level. Appearance is grossly similar to comparison radiography from 2017. Most recent cross-sectional comparison (December of 2016) was before placement of this augmentation. Posterior elements and transverse processes are intact. No suspicious osseous lesions are seen. Paraspinal and other soft tissues: No acute paravertebral swelling or fluid. No visible canal hematoma Disc levels: Level by level evaluation of the lumbar spine below: T12-L1: Compression deformities at T11 and T12 with 5 mm of retropulsion of the inferior T12 endplate resulting in mild canal stenosis at this level. Severe left and moderate right foraminal narrowing L1-L2: Near complete disc height loss with vacuum phenomenon and posterior spurring with mild canal stenosis and moderate bilateral foraminal narrowing L2-L3: Near complete disc height loss with vacuum phenomenon and posterior spurring with facet hypertrophic changes. Global disc bulge with a right central protrusion. Mild canal stenosis. Mild bilateral foraminal narrowing. L3-L4: Severe disc height loss with global disc bulge and moderate posterior facet hypertrophy with mild canal stenosis and moderate bilateral foraminal narrowing. L4-L5: Near complete disc height loss with global disc bulge, superimposed right central and subarticular disc protrusion. Posterior ligamentum flavum infolding. Moderate facet hypertrophic changes. Moderate canal stenosis. Moderate left and severe right neural foraminal narrowing. L5-S1: Complete disc height loss with vacuum phenomenon. Global disc bulge. Severe facet hypertrophy. Foci of gas or  lata about the left articular facet. Changes result in moderate canal stenosis and severe bilateral foraminal narrowing with additional narrowing of the left subarticular recess. IMPRESSION: 1. No definite acute traumatic injury of the thoracic or lumbar spine. 2. Remote compression deformity of the T12 vertebral body post augmentation with 40% residual anterior height loss. Appearance is similar to comparison radiography and MR imaging. 3. Remote compression deformity of the L1 vertebral body post augmentation with approximately 20% residual height loss anteriorly. Appearance is similar to comparison radiographs. 4. If there is concern for acute injury upon the more remote deformity at the augmented levels, MRI could be obtained to assess for edematous changes. 5. Multilevel degenerative changes throughout the thoracic and lumbar spine as described above. Features are most pronounced in the lower lumbar levels with multilevel moderate canal stenoses and moderate to severe foraminal narrowing. 6. For findings in the included portions of the chest, abdomen and pelvis. Please see dedicated report for the studies from which this exam is reconstructed. Electronically Signed   By: Lovena Le M.D.   On: 02/15/2019 17:44    Chart has been reviewed  Assessment/Plan  85 y.o. female with medical history significant of DM2, CAD, HTN, GERD, Hyponatremia, left artery stenosis, chronic dizziness and frequent falls, CHF EF 45%   Admitted for fall, possible syncope and hyponatremia  Present on Admission:  Hyponatremia -recurrent we will assess fluid status obtain urine electrolytes orthostatics check TSH check cortisol, fluid status to be difficult to ascertain patient reports she has not had much p.o. intake for the past 3 days.  No evidence of edema normal blood pressure but somewhat dry membranes.  Elevated BNP but no evidence of fluid overload on imaging no hypoxia.  Fall unclear etiology cannot rule out  syncope will obtain echogram monitor on telemetry obtain cardiac enzymes check carotid ultrasound   CAD (coronary artery disease) -  - chronic, continue aspirin and statin  Not on betablocker due to hx of bradycardia   HTN (hypertension) -chronic stable resume home blood pressure medications if blood pressure allows   Hyperlipidemia -chronic stable continue home medications   GERD (gastroesophageal reflux disease) -chronic stable   CKD (chronic kidney disease), stage III -avoid nephrotoxic medications   Thyroid nodule -obtain TSH T3 and T4, need to evaluate if prior work-up has been done   Carotid artery disease ([place]) -chronic given unclear history of a fall obtain carotid Dopplers as risk of syncope   Breast nodule -discussed with patient and family in a.m. regarding   long-term work-up and possible mammogram as an outpatient, discussed with family will talk to PCP regarding this  Mild memory issues - will watch for any sign of sun downing  Chronic systolic CHF -currently does not appear to be fluid overload last echogram showed an EF 45%.  We will be judicious with fluid use and follow sodium levels repeat echogram  Dm2 -  - Order Sensitive SSI     -  check TSH and HgA1C  - Hold by mouth medications     Other plan as per orders.  DVT prophylaxis:  SCD      Code Status:    DNR/DNI  as per patient  I had personally discussed CODE STATUS with patient and family    Family Communication:   Family   at  Bedside  plan of care was discussed  With  Husband,    Disposition Plan:                                           Would benefit from PT/OT eval prior to DC  Ordered                                      Social Work  consulted                   Nutrition    consulted                                       Consults called: none  Admission status:  ED Disposition    None      Obs       Level of care    tele  For   24H   Precautions:  NONE   No active  isolations  PPE: Used by the  provider:   P100  eye Goggles,  Gloves       Mychal Durio 02/15/2019, 11:57 PM    [facility]     after 2 AM please page floor coverage PA If 7AM-7PM, please contact the day team taking care of the patient using [email or web address]

## 2019-02-15 NOTE — ED Provider Notes (Signed)
  Physical Exam  BP (!) 170/94   Pulse 81   Temp 98.4 F (36.9 C) (Oral)   Resp 17   SpO2 97%   Physical Exam  ED Course/Procedures     Procedures  MDM  Care assumed at 4 pm. Patient has a fall and was confused afterwards.  Her sodium is 125 Signout pending trauma scan and admission for hyponatremia.  Patient appears euvolemic for previous team.  7:18 PM Serum and urine osms low. Likely unable to concentrate sodium. Trauma scan showed no new fractures. Will admit for hyponatremia, likely need fluid restriction  CRITICAL CARE Performed by: Wandra Arthurs   Total critical care time: 30 minutes  Critical care time was exclusive of separately billable procedures and treating other patients.  Critical care was necessary to treat or prevent imminent or life-threatening deterioration.  Critical care was time spent personally by me on the following activities: development of treatment plan with patient and/or surrogate as well as nursing, discussions with consultants, evaluation of patient's response to treatment, examination of patient, obtaining history from patient or surrogate, ordering and performing treatments and interventions, ordering and review of laboratory studies, ordering and review of radiographic studies, pulse oximetry and re-evaluation of patient's condition.       Drenda Freeze, MD 02/15/19 [phone]

## 2019-02-15 NOTE — ED Triage Notes (Signed)
Pt arrives to ED from [facility] with complaints of a mechanical fall on Friday that caused lower back pain. Patient states pain has gotten worse since the fall. Patient denies any injury to head and denies any LOC.

## 2019-02-16 ENCOUNTER — Encounter (HOSPITAL_COMMUNITY): Payer: PPO

## 2019-02-16 ENCOUNTER — Observation Stay (HOSPITAL_BASED_OUTPATIENT_CLINIC_OR_DEPARTMENT_OTHER): Payer: PPO

## 2019-02-16 DIAGNOSIS — I342 Nonrheumatic mitral (valve) stenosis: Secondary | ICD-10-CM | POA: Diagnosis not present

## 2019-02-16 DIAGNOSIS — I340 Nonrheumatic mitral (valve) insufficiency: Secondary | ICD-10-CM

## 2019-02-16 LAB — COMPREHENSIVE METABOLIC PANEL
ALT: 15 U/L (ref 0–44)
AST: 19 U/L (ref 15–41)
Albumin: 3.1 g/dL — ABNORMAL LOW (ref 3.5–5.0)
Alkaline Phosphatase: 67 U/L (ref 38–126)
Anion gap: 13 (ref 5–15)
BUN: 13 mg/dL (ref 8–23)
CO2: 23 mmol/L (ref 22–32)
Calcium: 9 mg/dL (ref 8.9–10.3)
Chloride: 92 mmol/L — ABNORMAL LOW (ref 98–111)
Creatinine, Ser: 1.05 mg/dL — ABNORMAL HIGH (ref 0.44–1.00)
GFR calc Af Amer: 56 mL/min — ABNORMAL LOW (ref >60)
GFR calc non Af Amer: 48 mL/min — ABNORMAL LOW (ref >60)
Glucose, Bld: 162 mg/dL — ABNORMAL HIGH (ref 70–99)
Potassium: 3.4 mmol/L — ABNORMAL LOW (ref 3.5–5.1)
Sodium: 128 mmol/L — ABNORMAL LOW (ref 135–145)
Total Bilirubin: 0.7 mg/dL (ref 0.3–1.2)
Total Protein: 5.7 g/dL — ABNORMAL LOW (ref 6.5–8.1)

## 2019-02-16 LAB — CBG MONITORING, ED
Glucose-Capillary: 153 mg/dL — ABNORMAL HIGH (ref 70–99)
Glucose-Capillary: 159 mg/dL — ABNORMAL HIGH (ref 70–99)

## 2019-02-16 LAB — CBC
HCT: 29.7 % — ABNORMAL LOW (ref 36.0–46.0)
Hemoglobin: 9.4 g/dL — ABNORMAL LOW (ref 12.0–15.0)
MCH: 23.7 pg — ABNORMAL LOW (ref 26.0–34.0)
MCHC: 31.6 g/dL (ref 30.0–36.0)
MCV: 75 fL — ABNORMAL LOW (ref 80.0–100.0)
Platelets: 428 10*3/uL — ABNORMAL HIGH (ref 150–400)
RBC: 3.96 MIL/uL (ref 3.87–5.11)
RDW: 16.7 % — ABNORMAL HIGH (ref 11.5–15.5)
WBC: 8.1 10*3/uL (ref 4.0–10.5)
nRBC: 0 % (ref 0.0–0.2)

## 2019-02-16 LAB — ECHOCARDIOGRAM COMPLETE

## 2019-02-16 LAB — T4, FREE: Free T4: 1.35 ng/dL — ABNORMAL HIGH (ref 0.61–1.12)

## 2019-02-16 LAB — SARS CORONAVIRUS 2 (TAT 6-24 HRS): SARS Coronavirus 2: NEGATIVE

## 2019-02-16 LAB — TROPONIN I (HIGH SENSITIVITY): Troponin I (High Sensitivity): 32 ng/L — ABNORMAL HIGH (ref <18)

## 2019-02-16 LAB — CORTISOL-AM, BLOOD: Cortisol - AM: 21.4 ug/dL (ref 6.7–22.6)

## 2019-02-16 LAB — HEMOGLOBIN A1C
Hgb A1c MFr Bld: 6.8 % — ABNORMAL HIGH (ref 4.8–5.6)
Mean Plasma Glucose: 148.46 mg/dL

## 2019-02-16 LAB — MAGNESIUM: Magnesium: 1.5 mg/dL — ABNORMAL LOW (ref 1.7–2.4)

## 2019-02-16 LAB — PHOSPHORUS: Phosphorus: 4.1 mg/dL (ref 2.5–4.6)

## 2019-02-16 MED ORDER — ONDANSETRON HCL 4 MG PO TABS: 4.0000 mg | ORAL_TABLET | Freq: Four times a day (QID) | ORAL | Status: DC | PRN

## 2019-02-16 MED ORDER — INSULIN ASPART 100 UNIT/ML ~~LOC~~ SOLN
0.0000 [IU] | Freq: Three times a day (TID) | SUBCUTANEOUS | Status: DC
  Administered 2019-02-16 (×2): 2 [IU] via SUBCUTANEOUS

## 2019-02-16 MED ORDER — ACETAMINOPHEN 650 MG RE SUPP: 650.0000 mg | Freq: Four times a day (QID) | RECTAL | Status: DC | PRN

## 2019-02-16 MED ORDER — ACETAMINOPHEN 325 MG PO TABS: 650.0000 mg | ORAL_TABLET | Freq: Four times a day (QID) | ORAL | Status: DC | PRN

## 2019-02-16 MED ORDER — ONDANSETRON HCL 4 MG/2ML IJ SOLN
4.0000 mg | Freq: Four times a day (QID) | INTRAMUSCULAR | Status: DC | PRN
  Administered 2019-02-16: 4 mg via INTRAVENOUS
  Filled 2019-02-16: qty 2

## 2019-02-16 MED ORDER — SENNA 8.6 MG PO TABS
1.0000 | ORAL_TABLET | Freq: Two times a day (BID) | ORAL | Status: DC
  Administered 2019-02-16: 8.6 mg via ORAL
  Filled 2019-02-16: qty 1

## 2019-02-16 MED ORDER — ASPIRIN 81 MG PO CHEW
81.0000 mg | CHEWABLE_TABLET | Freq: Every day | ORAL | Status: DC
  Administered 2019-02-16: 81 mg via ORAL
  Filled 2019-02-16: qty 1

## 2019-02-16 NOTE — Discharge Instructions (Signed)
Hyponatremia Hyponatremia is when the amount of salt (sodium) in your blood is too low. When sodium levels are low, your cells absorb extra water, which causes them to swell. The swelling happens throughout the body, but it mostly affects the brain. What are the causes? This condition may be caused by:  Certain medical conditions, such as: ? Heart, kidney, or liver problems. ? Thyroid problems. ? Adrenal gland problems. ? Metabolic conditions, such as Addison disease or syndrome of inappropriate antidiuretic hormone (SIADH).  Severe vomiting or diarrhea.  Certain medicines or illegal drugs.  Dehydration.  Drinking too much water.  Eating a diet that is low in sodium.  Large burns on your body.  Excessive sweating. What increases the risk? You are more likely to develop this condition if you:  Have long-term (chronic) kidney disease.  Have heart failure.  Have a medical condition that causes frequent or excessive diarrhea.  Participate in intense physical activities, such as marathon running.  Take certain medicines that affect the sodium and fluid balance in the blood. Some of these medicine types include: ? Diuretics. ? NSAIDs. ? Some opioid pain medicines. ? Some antidepressants. ? Some seizure prevention medicines. What are the signs or symptoms? Symptoms of this condition include:  Headache.  Nausea and vomiting.  Being very tired (lethargic).  Muscle weakness and cramping.  Loss of appetite.  Feeling weak or light-headed. Severe symptoms of this condition include:  Confusion.  Agitation.  Having a rapid heart rate.  Passing out (fainting).  Seizures.  Coma. How is this diagnosed? This condition is diagnosed based on:  A physical exam.  Your medical history.  Tests, including: ? Blood tests. ? Urine tests. How is this treated? Treatment for this condition depends on the cause. Treatment may include:  Getting fluids through an IV  that is inserted into one of your veins.  Medicines to correct the sodium imbalance. If medicines are causing the condition, the medicines will need to be adjusted.  Limiting your water or fluid intake to get the correct sodium balance.  Monitoring in the hospital setting to closely watch your symptoms for improvement. Follow these instructions at home:   Take over-the-counter and prescription medicines only as told by your health care provider. Many medicines can make this condition worse. Talk with your health care provider about any medicines that you are currently taking.  Carefully follow a recommended diet as told by your health care provider.  Carefully follow instructions from your health care provider about fluid restrictions.  Do not drink alcohol.  Keep all follow-up visits as told by your health care provider. This is important. Contact a health care provider if:  You develop worsening nausea, fatigue, headache, confusion, or weakness.  Your symptoms go away and then return.  You have problems following the recommended diet. Get help right away if:  You have a seizure.  You pass out.  You have ongoing diarrhea or vomiting. Summary  Hyponatremia is when the amount of salt (sodium) in your blood is too low.  When sodium levels are low, your cells absorb extra water, which causes them to swell.  The swelling happens throughout the body, but it mostly affects the brain.  Treatment for this condition depends on the cause. It may include IV fluids, medicines, and limiting your fluid intake. This information is not intended to replace advice given to you by your health care provider. Make sure you discuss any questions you have with your health care provider. Document   Released: 03/21/2002 Document Revised: 02/12/2018 Document Reviewed: 02/12/2018 Elsevier Patient Education  2020 [employer]  

## 2019-02-16 NOTE — ED Notes (Signed)
Echo at bedside

## 2019-02-16 NOTE — ED Notes (Signed)
Pt sitting up eating breakfast.

## 2019-02-16 NOTE — ED Notes (Signed)
*  Pt states rounding MD told her she could possibly be discharged this morning from the ED.

## 2019-02-16 NOTE — ED Notes (Signed)
Lunch Tray Ordered @ 1020.  

## 2019-02-16 NOTE — ED Notes (Signed)
Carb modified breakfast ordered

## 2019-02-16 NOTE — ED Notes (Signed)
Pt CBG was 153. Notified Benjamine Mola, RN

## 2019-02-16 NOTE — Progress Notes (Signed)
  Echocardiogram 2D Echocardiogram has been performed.  [name] 02/16/2019, 11:37 AM

## 2019-02-16 NOTE — Evaluation (Signed)
Physical Therapy Evaluation Patient Details Name: [name] MRN: [id number] DOB: [birth year] Today's Date: 02/16/2019   History of Present Illness  JAMISON BACARELLA is a 85 y.o. female with medical history significant of DM2, CAD, HTN, GERD, Hyponatremia, left artery stenosis, chronic dizziness (Meniere's disease) and frequent falls, CHF EF 45%.  She presented to ED due to fall that happened last Friday where she states she thinks she fell over her spouse's scooter and landed on her backside.  Had more pain than anticipated to presented for work up which has shown likely pre-existing T11, T12 & L1 compression deformities.  Clinical Impression  Patient presents with decreased balance, decreased safety awareness and continued high fall risk.  Demonstrates posterior bias and will need hands on assistance for safety when up on her feet.  Spouse and pt assure me that spouse will assist her even for night-time toileting activities.  Discussed rehab, but spouse states she would not be able to have visitors due to Covid restrictions.  He prefers to take her home.  State she has PT there at facility.  Would need HHPT and spouse assist for all mobility even with walker.  PT to follow if not d/c.     Follow Up Recommendations Home health PT;Supervision/Assistance - 24 hour    Equipment Recommendations  None recommended by PT    Recommendations for Other Services       Precautions / Restrictions Precautions Precautions: Fall Precaution Comments: reports 3 falls this year      Mobility  Bed Mobility Overal bed mobility: Needs Assistance Bed Mobility: Supine to Sit;Sit to Supine     Supine to sit: HOB elevated;Min assist Sit to supine: Supervision   General bed mobility comments: gave HHA to lift trunk upright on stretcher, cues for positioning in bed to supine  Transfers Overall transfer level: Needs assistance Equipment used: Rolling walker (2 wheeled) Transfers: Sit to/from Stand Sit  to Stand: Min guard         General transfer comment: placed my foot in front of hers to avoid slipping feet forward due to standing from taller surface and feels her slipper socks are slippery  Ambulation/Gait Ambulation/Gait assistance: Min assist Gait Distance (Feet): 30 Feet(x 2) Assistive device: Rolling walker (2 wheeled) Gait Pattern/deviations: Step-to pattern;Step-through pattern;Decreased stride length;Decreased dorsiflexion - right;Decreased dorsiflexion - left     General Gait Details: assist for balance due to posterior bias throughout; noted tightness in ankles and initial need for help to gain more anterior weight shift, walked across hall to bathroom and back, unable to turn her head to see direction until turned her body  Stairs            Wheelchair Mobility    Modified Rankin (Stroke Patients Only)       Balance Overall balance assessment: Needs assistance Sitting-balance support: Feet unsupported Sitting balance-Leahy Scale: Good   Postural control: Posterior lean Standing balance support: Bilateral upper extremity supported Standing balance-Leahy Scale: Poor Standing balance comment: leaning back with initial assist to gait anterior weight shift to allow ambulation                             Pertinent Vitals/Pain Pain Assessment: Faces Faces Pain Scale: Hurts a little bit Pain Location: at bruise on her buttocks Pain Descriptors / Indicators: Sore Pain Intervention(s): Monitored during session    Home Living Family/patient expects to be discharged to:: Private residence Living Arrangements: Spouse/significant  other Available Help at Discharge: Family Type of Home: Independent living facility(Friend's home [place])       Home Layout: One level(apartment) Home Equipment: Walker - 4 wheels;Walker - 2 wheels;Shower seat;Grab bars - tub/shower      Prior Function Level of Independence: Needs assistance   Gait / Transfers  Assistance Needed: walks with rollator in daytime and RW to bathroom at night; was getting PT at facility 2-3 times a week  ADL's / Homemaking Assistance Needed: sponge bathes two days and showers one day spouse helps sometimes        Hand Dominance        Extremity/Trunk Assessment   Upper Extremity Assessment Upper Extremity Assessment: Overall WFL for tasks assessed    Lower Extremity Assessment Lower Extremity Assessment: Generalized weakness;RLE deficits/detail;LLE deficits/detail RLE Deficits / Details: decreased ankle DF tight heel cords bilat LLE Deficits / Details: decreased ankle DF tight heel cords bilat    Cervical / Trunk Assessment Cervical / Trunk Assessment: Kyphotic  Communication   Communication: No difficulties  Cognition Arousal/Alertness: Awake/alert Behavior During Therapy: WFL for tasks assessed/performed Overall Cognitive Status: History of cognitive impairments - at baseline                                 General Comments: spouse present, pt repeating story with STM deficits      General Comments General comments (skin integrity, edema, etc.): spouse in the room in transport chair; reports able to ambulate short distances, but limited long distance due to SCI.  Educated only to allow pt up with RW and assist to prevent posterior LOB. even up to bathroom at night due to high fall risk.    Exercises     Assessment/Plan    PT Assessment Patient needs continued PT services  PT Problem List Decreased range of motion;Decreased balance;Decreased mobility;Decreased safety awareness       PT Treatment Interventions Therapeutic activities;Balance training;DME instruction;Gait training;Functional mobility training;Therapeutic exercise;Patient/family education    PT Goals (Current goals can be found in the Care Plan section)  Acute Rehab PT Goals Patient Stated Goal: to go home PT Goal Formulation: With patient/family Time For Goal  Achievement: 02/23/19 Potential to Achieve Goals: Fair    Frequency Min 3X/week   Barriers to discharge        Co-evaluation               AM-PAC PT "6 Clicks" Mobility  Outcome Measure Help needed turning from your back to your side while in a flat bed without using bedrails?: A Little Help needed moving from lying on your back to sitting on the side of a flat bed without using bedrails?: A Little Help needed moving to and from a bed to a chair (including a wheelchair)?: A Little Help needed standing up from a chair using your arms (e.g., wheelchair or bedside chair)?: A Little Help needed to walk in hospital room?: A Little Help needed climbing 3-5 steps with a railing? : A Lot 6 Click Score: 17    End of Session   Activity Tolerance: Patient tolerated treatment well Patient left: in bed;with call bell/phone within reach;with family/visitor present   PT Visit Diagnosis: Other abnormalities of gait and mobility (R26.89);History of falling (Z91.81)    Time: [id number] PT Time Calculation (min) (ACUTE ONLY): 35 min   Charges:   PT Evaluation $PT Eval Moderate Complexity: 1 Mod PT Treatments $Gait Training:  8-22 mins        Magda Kiel, [place] [facility] [phone] 02/16/2019   [name] 02/16/2019, 1:23 PM

## 2019-02-17 LAB — T3: T3, Total: 133 ng/dL (ref 71–180)

## 2019-02-21 ENCOUNTER — Encounter (HOSPITAL_COMMUNITY): Payer: Self-pay | Admitting: Emergency Medicine

## 2019-02-21 ENCOUNTER — Emergency Department (HOSPITAL_COMMUNITY): Payer: PPO

## 2019-02-21 ENCOUNTER — Emergency Department (HOSPITAL_COMMUNITY)
Admission: EM | Admit: 2019-02-21 | Discharge: 2019-02-21 | Discharge status: Against Medical Advice | Payer: PPO | Attending: Emergency Medicine | Admitting: Emergency Medicine

## 2019-02-21 DIAGNOSIS — Z5321 Procedure and treatment not carried out due to patient leaving prior to being seen by health care provider: Secondary | ICD-10-CM | POA: Insufficient documentation

## 2019-02-21 DIAGNOSIS — R0789 Other chest pain: Secondary | ICD-10-CM | POA: Diagnosis not present

## 2019-02-21 DIAGNOSIS — R079 Chest pain, unspecified: Secondary | ICD-10-CM | POA: Diagnosis not present

## 2019-02-21 LAB — BASIC METABOLIC PANEL
Anion gap: 12 (ref 5–15)
BUN: 15 mg/dL (ref 8–23)
CO2: 23 mmol/L (ref 22–32)
Calcium: 9 mg/dL (ref 8.9–10.3)
Chloride: 97 mmol/L — ABNORMAL LOW (ref 98–111)
Creatinine, Ser: 1.19 mg/dL — ABNORMAL HIGH (ref 0.44–1.00)
GFR calc Af Amer: 48 mL/min — ABNORMAL LOW (ref >60)
GFR calc non Af Amer: 42 mL/min — ABNORMAL LOW (ref >60)
Glucose, Bld: 155 mg/dL — ABNORMAL HIGH (ref 70–99)
Potassium: 4.3 mmol/L (ref 3.5–5.1)
Sodium: 132 mmol/L — ABNORMAL LOW (ref 135–145)

## 2019-02-21 LAB — CBC
HCT: 28.6 % — ABNORMAL LOW (ref 36.0–46.0)
Hemoglobin: 8.8 g/dL — ABNORMAL LOW (ref 12.0–15.0)
MCH: 23.7 pg — ABNORMAL LOW (ref 26.0–34.0)
MCHC: 30.8 g/dL (ref 30.0–36.0)
MCV: 77.1 fL — ABNORMAL LOW (ref 80.0–100.0)
Platelets: 427 10*3/uL — ABNORMAL HIGH (ref 150–400)
RBC: 3.71 MIL/uL — ABNORMAL LOW (ref 3.87–5.11)
RDW: 17.2 % — ABNORMAL HIGH (ref 11.5–15.5)
WBC: 8.4 10*3/uL (ref 4.0–10.5)
nRBC: 0 % (ref 0.0–0.2)

## 2019-02-21 LAB — TROPONIN I (HIGH SENSITIVITY): Troponin I (High Sensitivity): 23 ng/L — ABNORMAL HIGH (ref <18)

## 2019-02-21 MED ORDER — SODIUM CHLORIDE 0.9% FLUSH: 3.0000 mL | Freq: Once | INTRAVENOUS | Status: DC

## 2019-02-21 NOTE — ED Triage Notes (Signed)
Pt here from Nursing home with c/o chest pain that stated started about 1.5 hrs ago , pt received 3 nitro from nursing home which relieved the pain ems gave 324 mg asa , history of stent

## 2019-02-21 NOTE — ED Notes (Signed)
Pt requesting to leave, encouraged to stay but refused.

## 2019-02-22 ENCOUNTER — Telehealth: Payer: Self-pay | Admitting: Interventional Cardiology

## 2019-02-22 NOTE — Telephone Encounter (Signed)
Left message to call back  

## 2019-02-22 NOTE — Telephone Encounter (Signed)
  Husband is calling because patient has been to the ER twice since 02/15/19. He states that her visit to the ER on 02/21/19 she did not stay but did have lab work done. She had some numbers on her reports that Dr Laurann Montana, her PCP, said for them to get Dr Tamala Julian to look at. Please follow up with patient.

## 2019-02-24 NOTE — Telephone Encounter (Signed)
Left message to call back  

## 2019-02-24 NOTE — Progress Notes (Signed)
Cardiology Office Note:    Date:  02/25/2019   ID:  Earna Coder, DOB 1931-11-28, MRN [id number]  PCP:  Lavone Orn, MD  Cardiologist:  Sinclair Grooms, MD  Electrophysiologist:  None  PV: Dr. [name]  Referring MD: Lavone Orn, MD   Chief Complaint  Patient presents with   Hospitalization Follow-up    ED w/ chest pain    History of Present Illness:    [name] is a 85 y.o. female with:   Coronary artery disease  S/p MI (RV infarct) 2007, c/b high grade AV block - temp pacer; RCA occl >> PCI not successful  Known CTO of RCA (cath 07/2006)  Renal artery stenosis  S/p prior L RA stenting  S/p repeat L RA stenting in 01/2016 (ISR)  Carotid artery dz  Hx of CVA  Mod aortic stenosis   Mild mitral stenosis  Hypertension   Hyperlipidemia   Diabetes mellitus   Chronic kidney disease 3   Hx of syncope  Meniere's Dz  [name] was last seen by Dr. Tamala Julian in 2018.   She went to the emergency room 11/3 after a fall at friend's home last.  She had CT scans that demonstrated no fracture in her spine.  She was noted to have a low sodium (125).  Echocardiogram demonstrated normal LV function, grade 1 diastolic dysfunction, mild MR, mild MS, moderate AI moderate aortic stenosis with a mean gradient of 20.  BNP was 1115.9.  High-sensitivity troponin was 25 and repeat was 32.  Hemoglobin was noted to be 9.8.  She returned to the emergency room 11/9 with chest discomfort.  This was not like her previous angina.  However, she does describe a substernal pressure with some nausea.  She did not have relief with taking 3 nitroglycerin.  High sensitivity troponin was 23.  She was placed on Xanax and has not had further chest discomfort.  She has not had shortness of breath, orthopnea, paroxysmal nocturnal dyspnea, syncope.  She has not had leg swelling.  Most troublesome for her is sacral pain related to her fall on 11/3.  Her PCP has prescribed tramadol without much success.     Prior CV studies:   The following studies were reviewed today:  Echocardiogram 02/16/2019 EF 55-60, mild septal LVH, mild post LVH, Gr 1 DD, mild LAE, mild MAC, mild MR, mild MS (mean 4), severe AoV calc, mod AI, mod AS (mean 20)  Renal Art [place] 10/06/2018 Summary: Largest Aortic Diameter: 2.2 cm Renal: Right: Cyst(s) noted. RRV flow present. No evidence of right renal        artery stenosis.  Left:  Normal size of left kidney. Cyst(s) noted. LRV flow present.        Normal cortical thickness of the left kidney. The left renal        stent was not visualized today due to overlying bowel and        patient discomfort.        Decreased left kidney size compared to previous, although        images are suboptimal. Mesenteric: Normal Superior Mesenteric artery findings. 70 to 99% stenosis in the celiac artery. Areas of limited visceral study include left renal artery.  Carotid US 10/06/2018 Bilateral ICA 1-39; left VA retrograde flow; left subclavian stenosis with left subclavian steal >> repeat 1 year  Myoview 10/24/2015  Nuclear stress EF: 53%.  There was no ST segment deviation noted during stress.  Defect 1:  There is a small defect of severe severity present in the basal inferior and mid inferior location.  Findings consistent with prior myocardial infarction.  This is a low risk study.  The left ventricular ejection fraction is mildly decreased (45-54%). There is a small severe non-reversible defect in the basal and mid inferior walls consistent with prior infarction and no evidence of ischemia.  Event monitor 10/01/15  Underlying rhythm is NSR with HR range 37-114 bpm. Ave HR 65 bpm.  Syncope episode not associated with arrhythmia Underlying basic rhythm is NSR with occasional bradycardia as low as 37 bpm. Syncope not associated with rhythm disturbance. Possible early Sick sinus given occasional bradycardia below 50 bpm.  Cardiac catheterization 07/30/2006 LM ok LAD  mid 60 LCx irregs RCA 100 (L-R collats) L RA superior br 50-60 EF 60  Past Medical History:  Diagnosis Date   Bradycardia    a. Coreg decreased due to HR low 40s on event monitor Jun 23, 2015 -> further decreased due to HR upper 30s in 01/2016.   CAD (coronary artery disease)    a. RV infarct 06/22/2005 c/b high grade AV block with total occlusion of RCA, unable to treat with PCI. b. Nuc 10/2015: scar but no ischemia.   Carotid artery disease (HCC)    Chronic anemia    Chronic lower back pain    CKD (chronic kidney disease), stage III    Complication of anesthesia    "almost died when I had my breast reduction"   Dyslipidemia    GERD (gastroesophageal reflux disease)    Heart murmur    High cholesterol    History of hiatal hernia    HTN (hypertension)    Hyperthyroidism    Meniere disease    "for years" takes Antivert 3 times per day (01/31/2016)   Myocardial infarction [facility]) 06-22-05   "almost died"   Near syncope    Osteoporosis    Renal artery stenosis ([place])    a. left renal artery stent 06/22/10. b. s/p repeat left renal stenting 01/2016.   Stroke [facility])    a. Prior infarcts seen on head CT.   Type II diabetes mellitus ([place])    "controlled w/diet and exercise only" (01/31/2016)   Surgical Hx: The patient  has a past surgical history that includes Hip fracture surgery (Right, 06/22/2009); Wrist fracture surgery (Left, June 22, 2010); Bladder suspension (x3); Anterior cervical decomp/discectomy fusion (02/2002); Renal artery stent (Left, 06/22/10); Cardiac catheterization (Left, 12/20/2014); Fracture surgery; Back surgery; Neck hardware removal (11/2002); Cataract extraction w/ intraocular lens  implant, bilateral (Bilateral); Kyphoplasty (N/A, 03/20/2015); Tonsillectomy; Appendectomy; Kyphoplasty (N/A, 04/23/2015); renal arteriogram (12/2014); Cardiac catheterization (N/A, 01/31/2016); Cardiac catheterization (N/A, 01/31/2016); Cardiac catheterization (01/31/2016); Cholecystectomy open; Cataract  extraction w/ intraocular lens  implant, bilateral (Bilateral); Cardiac catheterization 2005-06-22); Vaginal hysterectomy (~ 1993/06/22); Tubal ligation; Reduction mammaplasty (Bilateral, 1985); and Breast lumpectomy (11/2007).   Current Medications: Current Meds  Medication Sig   acetaminophen (TYLENOL) 500 MG tablet Take 1,000 mg by mouth every 8 (eight) hours as needed for mild pain (for pain.).    ALPRAZolam (XANAX) 0.25 MG tablet Take 0.125 mg by mouth at bedtime as needed for anxiety or sleep.   aspirin 81 MG tablet Take 1 tablet (81 mg total) by mouth daily.   azelastine (ASTELIN) 0.1 % nasal spray Place 1-2 sprays into both nostrils daily as needed. For allergies/congestion.   betamethasone dipropionate (DIPROLENE) 0.05 % cream Apply 1 application topically daily as needed (Skin care).    cholecalciferol (VITAMIN D) 1000 UNITS  tablet Take 2,000 Units by mouth daily.    cloNIDine (CATAPRES) 0.1 MG tablet TAKE 1 TABLET BY MOUTH EVERY MORNING AND 2 TABLETS IN THE EVENING (Patient taking differently: Take 0.1-0.2 mg by mouth See admin instructions. Take 1 tablet bu mouth every morning and 2 tablets by mouth in the evening.)   clopidogrel (PLAVIX) 75 MG tablet Take 1 tablet (75 mg total) by mouth daily. Please make overdue appt for future refills. [phone]. 2nd attempt. (Patient taking differently: Take 75 mg by mouth daily. )   denosumab (PROLIA) 60 MG/ML SOLN injection Inject 60 mg into the skin every 6 (six) months. Administer in upper arm, thigh, or abdomen   docusate sodium (COLACE) 100 MG capsule Take 200 mg by mouth at bedtime as needed for mild constipation.   fexofenadine (ALLEGRA) 180 MG tablet Take 180 mg by mouth daily.   metFORMIN (GLUCOPHAGE-XR) 500 MG 24 hr tablet Take 500 mg by mouth daily with supper.    methimazole (TAPAZOLE) 5 MG tablet Take 5 mg by mouth daily.   nitroGLYCERIN (NITROSTAT) 0.4 MG SL tablet Place 0.4 mg under the tongue every 5 (five) minutes as needed  for chest pain (3 doses MAX).   OneTouch Delica Lancets 79K MISC USE AS DIRECTED TO CHECK BLOOD SUGAR DAILY AND AS NEEDED E11.9   ONETOUCH ULTRA test strip USE AS DIRECTED TO CHECK BLOOD SUGARS DAILY AS NEEDED   Polyethyl Glycol-Propyl Glycol (SYSTANE FREE OP) Place 2 drops into both eyes at bedtime.     Allergies:   Fentanyl, Fish allergy, Morphine and related, Singulair [montelukast], Actonel [risedronate sodium], Dilaudid [hydromorphone hcl], Fosamax [alendronate sodium], Hydralazine, Lipitor [atorvastatin], Lisinopril, Micardis [telmisartan], Septra [sulfamethoxazole-trimethoprim], Tekturna [aliskiren], Tricor [fenofibrate], Imdur [isosorbide dinitrate], Amlodipine, Avapro [irbesartan], Cardio complete [nutritional supplements], Cardizem [diltiazem hcl], Ciprofloxacin, Codeine, Cyclobenzaprine, Forteo [parathyroid hormone (recomb)], Inapsine [droperidol], Ivp dye [iodinated diagnostic agents], Shellfish allergy, and Tussionex pennkinetic er [employer] polst-cpm polst er]   Social History   Tobacco Use   Smoking status: Former Smoker    Packs/day: 1.00    Years: 30.00    Pack years: 30.00    Types: Cigarettes   Smokeless tobacco: Never Used   Tobacco comment: "quit smoking in 1989"  Substance Use Topics   Alcohol use: No    Alcohol/week: 0.0 standard drinks   Drug use: No     Family Hx: The patient's family history includes Alzheimer's disease in her sister; Congestive Heart Failure in her father; Dementia in her brother; Diabetes type II in her brother, mother, sister, and sister; Healthy in her sister and sister; Heart attack in her mother and sister; Heart disease in her brother, mother, and sister; Kidney failure in her sister and sister; Other in her brother; Pulmonary disease in her sister; Pulmonary embolism in her father; Stroke in her brother and brother.  ROS:   Please see the history of present illness.    Review of Systems  Gastrointestinal: Negative for  hematochezia.  Genitourinary: Negative for hematuria.   All other systems reviewed and are negative.   EKGs/Labs/Other Test Reviewed:    EKG:  EKG is not ordered today.  The ekg ordered today demonstrates n/a ECG performed 02/22/2019 was personally reviewed and demonstrated normal sinus rhythm, heart rate 71, normal axis, nonspecific ST-T wave changes, QTC 423, no change from prior tracings  Recent Labs: 02/15/2019: B Natriuretic Peptide 1,115.9; TSH 2.464 02/16/2019: ALT 15; Magnesium 1.5 02/21/2019: BUN 15; Creatinine, Ser 1.19; Hemoglobin 8.8; Platelets 427; Potassium 4.3; Sodium 132  Recent Lipid Panel Lab Results  Component Value Date/Time   CHOL  05/10/2008 06:35 AM    177        ATP III CLASSIFICATION:  <200     mg/dL   Desirable  200-239  mg/dL   Borderline High  >=240    mg/dL   High          TRIG 546 (H) 05/10/2008 06:35 AM   HDL 31 (L) 05/10/2008 06:35 AM   CHOLHDL 5.7 05/10/2008 06:35 AM   LDLCALC  05/10/2008 06:35 AM    UNABLE TO CALCULATE IF TRIGLYCERIDE OVER 400 mg/dL        Total Cholesterol/HDL:CHD Risk Coronary Heart Disease Risk Table                     Men   Women  1/2 Average Risk   3.4   3.3  Average Risk       5.0   4.4  2 X Average Risk   9.6   7.1  3 X Average Risk  23.4   11.0        Use the calculated Patient Ratio above and the CHD Risk Table to determine the patient's CHD Risk.        ATP III CLASSIFICATION (LDL):  <100     mg/dL   Optimal  100-129  mg/dL   Near or Above                    Optimal  130-159  mg/dL   Borderline  160-189  mg/dL   High  >190     mg/dL   Very High    Physical Exam:    VS:  BP 134/62    Pulse 70    Ht _0  (1.549 m)    Wt 99 lb 1.9 oz (45 kg)    SpO2 96%    BMI 18.73 kg/m     Wt Readings from Last 3 Encounters:  02/25/19 99 lb 1.9 oz (45 kg)  09/21/18 105 lb (47.6 kg)  01/14/18 108 lb (49 kg)     Physical Exam  Constitutional: She is oriented to person, place, and time. She appears well-developed  and well-nourished. No distress.  HENT:  Head: Normocephalic and atraumatic.  Eyes: No scleral icterus.  Neck: No thyromegaly present.  Cardiovascular: Normal rate and regular rhythm.  Murmur heard.  Harsh systolic murmur is present with a grade of 2/6 at the upper right sternal border and lower left sternal border. Pulmonary/Chest: Effort normal and breath sounds normal. She has no rales.  Abdominal: Soft. There is no hepatomegaly.  Musculoskeletal:        General: No edema.  Lymphadenopathy:    She has no cervical adenopathy.  Neurological: She is alert and oriented to person, place, and time.  Skin: Skin is warm and dry.  Psychiatric: She has a normal mood and affect.    ASSESSMENT & PLAN:    1. Coronary artery disease involving native coronary artery of native heart without angina pectoris History of RV infarct in 2007.  Her RCA is known to be chronically occluded by cardiac catheterization in 2008.  She recently presented to the emergency room with chest discomfort.  This occurred after another ER visit with back pain related to a recent fall.  She tells me that she has difficulty discerning the back pain from the chest pain.  This only occurred on one occasion and she has not  had a recurrence since starting on Xanax.  She notes increased issues with stress.  Her ECG did not demonstrate any acute findings.  She did have mildly elevated high-sensitivity troponin levels.  However, the delta was less than 20.  The etiology of her chest pain is not entirely clear.  Given her prior history of coronary artery disease, we discussed the possibility of proceeding with stress testing for risk ratification.  However, she notes that she would not pursue cardiac catheterization if a stress test was abnormal.  Therefore, I am not certain that stress testing would be all that helpful.  I considered adjusting her medical therapy for coronary artery disease.  However, she has previous intolerance to  amlodipine and isosorbide.  She has as needed nitroglycerin at home.  No further testing or intervention at this time.  Continue aspirin, statin therapy.  2. Elevated brain natriuretic peptide (BNP) level BNP in the emergency room 02/15/2019 was over 1000.  Her exam is not consistent with volume excess.  She has not been short of breath.  Etiology of this is not entirely clear.  She does not have atrial fibrillation.  I will repeat her BNP today.  If it remains significantly elevated, consider low-dose diuretic therapy.  3. Essential hypertension Fair control.  Continue current therapy.  4. Aortic valve stenosis Moderate aortic stenosis by recent echocardiogram.  Mean gradient 20.  She will likely need a repeat echocardiogram in 1 year.  5. Anemia, unspecified type Etiology not entirely clear.  Ferritin was normal in the hospital.  Repeat CBC today.  If her hemoglobin continues to drop, this may explain her chest discomfort as well as her elevated BNP.  She will need early follow-up with primary care.  6. Hyponatremia Follow-up sodium was improved.  Repeat BMET today.   Dispo:  Return in about 4 weeks (around 03/25/2019) for Close Follow Up w/ Dr. Tamala Julian, (virtual or in-person).   Medication Adjustments/Labs and Tests Ordered: Current medicines are reviewed at length with the patient today.  Concerns regarding medicines are outlined above.  Tests Ordered: Orders Placed This Encounter  Procedures   Pro b natriuretic peptide   CBC w/Diff   Basic Metabolic Panel (BMET)   Medication Changes: Meds ordered this encounter  Medications   telmisartan (MICARDIS) 20 MG tablet    Sig: Take 0.5 tablets (10 mg total) by mouth daily.    Dispense:  30 tablet    Refill:  3    Signed, Richardson Dopp, PA-C  02/25/2019 1:43 PM    [facility] [address], [place], [place]  [address] Phone: [phone]; Fax: [phone]

## 2019-02-24 NOTE — Telephone Encounter (Signed)
Spoke with husband.  Pt has not had any further CP since she went to ED.  Inquired about blood in her stool, urine, etc since her Hgb has dropped from 9.8 to 8.8 in 8 days.  Husband denies any bleeding issues.  Pt seen by PCP and they recommended they contact our office and have Dr. Tamala Julian review labs.  Troponin was elevated but is better than it was 8 days ago.

## 2019-02-24 NOTE — Telephone Encounter (Signed)
Spoke with husband and scheduled pt to come into the office tomorrow at 1215 to see Richardson Dopp, PA-C for assessment.

## 2019-02-24 NOTE — Telephone Encounter (Signed)
Let them know that another artery may be trying to close.  She needs to use nitroglycerin to get relief.  If nitroglycerin up to 3 does not relieve discomfort (each dose must be 5 minutes apart from the prior) she needs to return to the emergency room.  She may need to have a repeat heart catheterization.  She needs to come into the office and see 1 of [place] within the next 3 days.

## 2019-02-25 ENCOUNTER — Ambulatory Visit: Payer: PPO | Admitting: Physician Assistant

## 2019-02-25 ENCOUNTER — Other Ambulatory Visit: Payer: Self-pay

## 2019-02-25 ENCOUNTER — Encounter: Payer: Self-pay | Admitting: Physician Assistant

## 2019-02-25 VITALS — BP 134/62 | HR 70 | Ht 61.0 in | Wt 99.1 lb

## 2019-02-25 DIAGNOSIS — I350 Nonrheumatic aortic (valve) stenosis: Secondary | ICD-10-CM

## 2019-02-25 DIAGNOSIS — I2510 Atherosclerotic heart disease of native coronary artery without angina pectoris: Secondary | ICD-10-CM | POA: Diagnosis not present

## 2019-02-25 DIAGNOSIS — E871 Hypo-osmolality and hyponatremia: Secondary | ICD-10-CM | POA: Diagnosis not present

## 2019-02-25 DIAGNOSIS — I10 Essential (primary) hypertension: Secondary | ICD-10-CM

## 2019-02-25 DIAGNOSIS — D649 Anemia, unspecified: Secondary | ICD-10-CM

## 2019-02-25 DIAGNOSIS — R7989 Other specified abnormal findings of blood chemistry: Secondary | ICD-10-CM | POA: Diagnosis not present

## 2019-02-25 MED ORDER — TELMISARTAN 20 MG PO TABS: 10.0000 mg | ORAL_TABLET | Freq: Every day | ORAL | 3 refills | Status: DC

## 2019-02-25 NOTE — Patient Instructions (Addendum)
Medication Instructions:  Your physician recommends that you continue on your current medications as directed. Please refer to the Current Medication list given to you today.  *If you need a refill on your cardiac medications before your next appointment, please call your pharmacy*  Lab Work: Your physician recommends that you have lab work today: CBC/BNP/bmet If you have labs (blood work) drawn today and your tests are completely normal, you will receive your results only by: [name] MyChart Message (if you have MyChart) OR . A paper copy in the mail If you have any lab test that is abnormal or we need to change your treatment, we will call you to review the results.  Testing/Procedures: -NONE  Follow-Up: At [facility], you and your health needs are our priority.  As part of our continuing mission to provide you with exceptional heart care, we have created designated Provider Care Teams.  These Care Teams include your primary Cardiologist (physician) and Advanced Practice Providers (APPs -  Physician Assistants and Nurse Practitioners) who all work together to provide you with the care you need, when you need it.  Your next appointment:   4-6 weeks  The format for your next appointment:   Either In Person or Virtual  Provider:   Richardson Dopp, PA  Other Instructions -None

## 2019-02-26 LAB — CBC WITH DIFFERENTIAL/PLATELET
Basophils Absolute: 0 10*3/uL (ref 0.0–0.2)
Basos: 0 %
EOS (ABSOLUTE): 0.1 10*3/uL (ref 0.0–0.4)
Eos: 1 %
Hematocrit: 29.8 % — ABNORMAL LOW (ref 34.0–46.6)
Hemoglobin: 9.2 g/dL — ABNORMAL LOW (ref 11.1–15.9)
Immature Grans (Abs): 0 10*3/uL (ref 0.0–0.1)
Immature Granulocytes: 0 %
Lymphocytes Absolute: 1.5 10*3/uL (ref 0.7–3.1)
Lymphs: 19 %
MCH: 23.3 pg — ABNORMAL LOW (ref 26.6–33.0)
MCHC: 30.9 g/dL — ABNORMAL LOW (ref 31.5–35.7)
MCV: 75 fL — ABNORMAL LOW (ref 79–97)
Monocytes Absolute: 0.4 10*3/uL (ref 0.1–0.9)
Monocytes: 4 %
Neutrophils Absolute: 6 10*3/uL (ref 1.4–7.0)
Neutrophils: 76 %
Platelets: 503 10*3/uL — ABNORMAL HIGH (ref 150–450)
RBC: 3.95 x10E6/uL (ref 3.77–5.28)
RDW: 17.1 % — ABNORMAL HIGH (ref 11.7–15.4)
WBC: 8 10*3/uL (ref 3.4–10.8)

## 2019-02-26 LAB — BASIC METABOLIC PANEL
BUN/Creatinine Ratio: 19 (ref 12–28)
BUN: 23 mg/dL (ref 8–27)
CO2: 22 mmol/L (ref 20–29)
Calcium: 9.5 mg/dL (ref 8.7–10.3)
Chloride: 101 mmol/L (ref 96–106)
Creatinine, Ser: 1.19 mg/dL — ABNORMAL HIGH (ref 0.57–1.00)
GFR calc Af Amer: 48 mL/min/{1.73_m2} — ABNORMAL LOW (ref >59)
GFR calc non Af Amer: 42 mL/min/{1.73_m2} — ABNORMAL LOW (ref >59)
Glucose: 112 mg/dL — ABNORMAL HIGH (ref 65–99)
Potassium: 4.7 mmol/L (ref 3.5–5.2)
Sodium: 137 mmol/L (ref 134–144)

## 2019-02-26 LAB — PRO B NATRIURETIC PEPTIDE: NT-Pro BNP: 5548 pg/mL — ABNORMAL HIGH (ref 0–738)

## 2019-02-28 ENCOUNTER — Other Ambulatory Visit: Payer: Self-pay | Admitting: *Deleted

## 2019-02-28 DIAGNOSIS — I2510 Atherosclerotic heart disease of native coronary artery without angina pectoris: Secondary | ICD-10-CM

## 2019-02-28 DIAGNOSIS — R7989 Other specified abnormal findings of blood chemistry: Secondary | ICD-10-CM

## 2019-02-28 MED ORDER — FUROSEMIDE 20 MG PO TABS: 20.0000 mg | ORAL_TABLET | ORAL | 3 refills | Status: DC

## 2019-03-08 ENCOUNTER — Other Ambulatory Visit: Payer: Self-pay

## 2019-03-08 ENCOUNTER — Other Ambulatory Visit: Payer: PPO

## 2019-03-08 DIAGNOSIS — I2510 Atherosclerotic heart disease of native coronary artery without angina pectoris: Secondary | ICD-10-CM | POA: Diagnosis not present

## 2019-03-08 DIAGNOSIS — R7989 Other specified abnormal findings of blood chemistry: Secondary | ICD-10-CM | POA: Diagnosis not present

## 2019-03-09 ENCOUNTER — Other Ambulatory Visit: Payer: Self-pay

## 2019-03-09 ENCOUNTER — Telehealth: Payer: Self-pay

## 2019-03-09 DIAGNOSIS — Z79899 Other long term (current) drug therapy: Secondary | ICD-10-CM

## 2019-03-09 DIAGNOSIS — R7989 Other specified abnormal findings of blood chemistry: Secondary | ICD-10-CM

## 2019-03-09 DIAGNOSIS — I2510 Atherosclerotic heart disease of native coronary artery without angina pectoris: Secondary | ICD-10-CM

## 2019-03-09 LAB — BASIC METABOLIC PANEL
BUN/Creatinine Ratio: 17 (ref 12–28)
BUN: 20 mg/dL (ref 8–27)
CO2: 25 mmol/L (ref 20–29)
Calcium: 9.7 mg/dL (ref 8.7–10.3)
Chloride: 100 mmol/L (ref 96–106)
Creatinine, Ser: 1.17 mg/dL — ABNORMAL HIGH (ref 0.57–1.00)
GFR calc Af Amer: 49 mL/min/{1.73_m2} — ABNORMAL LOW (ref >59)
GFR calc non Af Amer: 43 mL/min/{1.73_m2} — ABNORMAL LOW (ref >59)
Glucose: 95 mg/dL (ref 65–99)
Potassium: 5.3 mmol/L — ABNORMAL HIGH (ref 3.5–5.2)
Sodium: 136 mmol/L (ref 134–144)

## 2019-03-09 LAB — PRO B NATRIURETIC PEPTIDE: NT-Pro BNP: 5028 pg/mL — ABNORMAL HIGH (ref 0–738)

## 2019-03-09 NOTE — Telephone Encounter (Signed)
-----   Message from Liliane Shi, [place] sent at 03/09/2019  4:57 PM EST ----- Ok  PLAN:   - Continue on Lasix 3 x a week  - Check BMET, BNP in 2-3 weeks Richardson Dopp, PA-C    03/09/2019 4:56 PM

## 2019-03-09 NOTE — Telephone Encounter (Signed)
Called patient back about Cathy Dopp PA's plan. Patient stated she would take Lasix 3 times a week and will come in on 03/25/19 for repeat lab work.

## 2019-03-16 DIAGNOSIS — R2689 Other abnormalities of gait and mobility: Secondary | ICD-10-CM | POA: Diagnosis not present

## 2019-03-16 DIAGNOSIS — R42 Dizziness and giddiness: Secondary | ICD-10-CM | POA: Diagnosis not present

## 2019-03-16 DIAGNOSIS — I2510 Atherosclerotic heart disease of native coronary artery without angina pectoris: Secondary | ICD-10-CM | POA: Diagnosis not present

## 2019-03-16 DIAGNOSIS — H2521 Age-related cataract, morgagnian type, right eye: Secondary | ICD-10-CM | POA: Diagnosis not present

## 2019-03-16 DIAGNOSIS — K219 Gastro-esophageal reflux disease without esophagitis: Secondary | ICD-10-CM | POA: Diagnosis not present

## 2019-03-16 DIAGNOSIS — N1832 Chronic kidney disease, stage 3b: Secondary | ICD-10-CM | POA: Diagnosis not present

## 2019-03-16 DIAGNOSIS — M6281 Muscle weakness (generalized): Secondary | ICD-10-CM | POA: Diagnosis not present

## 2019-03-25 ENCOUNTER — Other Ambulatory Visit: Payer: Self-pay

## 2019-03-25 ENCOUNTER — Other Ambulatory Visit: Payer: PPO | Admitting: *Deleted

## 2019-03-25 DIAGNOSIS — I2510 Atherosclerotic heart disease of native coronary artery without angina pectoris: Secondary | ICD-10-CM

## 2019-03-25 DIAGNOSIS — K219 Gastro-esophageal reflux disease without esophagitis: Secondary | ICD-10-CM | POA: Diagnosis not present

## 2019-03-25 DIAGNOSIS — R410 Disorientation, unspecified: Secondary | ICD-10-CM | POA: Diagnosis not present

## 2019-03-25 DIAGNOSIS — Z79899 Other long term (current) drug therapy: Secondary | ICD-10-CM | POA: Diagnosis not present

## 2019-03-25 DIAGNOSIS — I129 Hypertensive chronic kidney disease with stage 1 through stage 4 chronic kidney disease, or unspecified chronic kidney disease: Secondary | ICD-10-CM | POA: Diagnosis not present

## 2019-03-25 DIAGNOSIS — R7989 Other specified abnormal findings of blood chemistry: Secondary | ICD-10-CM

## 2019-03-25 DIAGNOSIS — E1122 Type 2 diabetes mellitus with diabetic chronic kidney disease: Secondary | ICD-10-CM | POA: Diagnosis not present

## 2019-03-26 LAB — BASIC METABOLIC PANEL
BUN/Creatinine Ratio: 18 (ref 12–28)
BUN: 20 mg/dL (ref 8–27)
CO2: 26 mmol/L (ref 20–29)
Calcium: 9.3 mg/dL (ref 8.7–10.3)
Chloride: 91 mmol/L — ABNORMAL LOW (ref 96–106)
Creatinine, Ser: 1.14 mg/dL — ABNORMAL HIGH (ref 0.57–1.00)
GFR calc Af Amer: 51 mL/min/{1.73_m2} — ABNORMAL LOW (ref >59)
GFR calc non Af Amer: 44 mL/min/{1.73_m2} — ABNORMAL LOW (ref >59)
Glucose: 216 mg/dL — ABNORMAL HIGH (ref 65–99)
Potassium: 4.5 mmol/L (ref 3.5–5.2)
Sodium: 130 mmol/L — ABNORMAL LOW (ref 134–144)

## 2019-03-26 LAB — PRO B NATRIURETIC PEPTIDE: NT-Pro BNP: 3715 pg/mL — ABNORMAL HIGH (ref 0–738)

## 2019-04-06 ENCOUNTER — Emergency Department (HOSPITAL_BASED_OUTPATIENT_CLINIC_OR_DEPARTMENT_OTHER): Payer: PPO

## 2019-04-06 ENCOUNTER — Encounter (HOSPITAL_BASED_OUTPATIENT_CLINIC_OR_DEPARTMENT_OTHER): Payer: Self-pay | Admitting: *Deleted

## 2019-04-06 ENCOUNTER — Emergency Department (HOSPITAL_BASED_OUTPATIENT_CLINIC_OR_DEPARTMENT_OTHER)
Admission: EM | Admit: 2019-04-06 | Discharge: 2019-04-06 | Disposition: A | Discharge status: Home / Self Care | Payer: PPO | Attending: Emergency Medicine | Admitting: Emergency Medicine

## 2019-04-06 ENCOUNTER — Other Ambulatory Visit: Payer: Self-pay

## 2019-04-06 DIAGNOSIS — S0101XA Laceration without foreign body of scalp, initial encounter: Secondary | ICD-10-CM | POA: Insufficient documentation

## 2019-04-06 DIAGNOSIS — Z7984 Long term (current) use of oral hypoglycemic drugs: Secondary | ICD-10-CM | POA: Diagnosis not present

## 2019-04-06 DIAGNOSIS — I129 Hypertensive chronic kidney disease with stage 1 through stage 4 chronic kidney disease, or unspecified chronic kidney disease: Secondary | ICD-10-CM | POA: Insufficient documentation

## 2019-04-06 DIAGNOSIS — Z7901 Long term (current) use of anticoagulants: Secondary | ICD-10-CM | POA: Insufficient documentation

## 2019-04-06 DIAGNOSIS — S0990XA Unspecified injury of head, initial encounter: Secondary | ICD-10-CM | POA: Diagnosis not present

## 2019-04-06 DIAGNOSIS — Y9389 Activity, other specified: Secondary | ICD-10-CM | POA: Diagnosis not present

## 2019-04-06 DIAGNOSIS — W19XXXA Unspecified fall, initial encounter: Secondary | ICD-10-CM

## 2019-04-06 DIAGNOSIS — N183 Chronic kidney disease, stage 3 unspecified: Secondary | ICD-10-CM | POA: Insufficient documentation

## 2019-04-06 DIAGNOSIS — E1122 Type 2 diabetes mellitus with diabetic chronic kidney disease: Secondary | ICD-10-CM | POA: Diagnosis not present

## 2019-04-06 DIAGNOSIS — Y92199 Unspecified place in other specified residential institution as the place of occurrence of the external cause: Secondary | ICD-10-CM | POA: Diagnosis not present

## 2019-04-06 DIAGNOSIS — Z23 Encounter for immunization: Secondary | ICD-10-CM | POA: Diagnosis not present

## 2019-04-06 DIAGNOSIS — W01198A Fall on same level from slipping, tripping and stumbling with subsequent striking against other object, initial encounter: Secondary | ICD-10-CM | POA: Diagnosis not present

## 2019-04-06 DIAGNOSIS — Z79899 Other long term (current) drug therapy: Secondary | ICD-10-CM | POA: Insufficient documentation

## 2019-04-06 DIAGNOSIS — Y999 Unspecified external cause status: Secondary | ICD-10-CM | POA: Insufficient documentation

## 2019-04-06 DIAGNOSIS — R5381 Other malaise: Secondary | ICD-10-CM | POA: Diagnosis not present

## 2019-04-06 MED ORDER — LIDOCAINE-EPINEPHRINE (PF) 2 %-1:200000 IJ SOLN
INTRAMUSCULAR | Status: AC
  Filled 2019-04-06: qty 10

## 2019-04-06 MED ORDER — LIDOCAINE-EPINEPHRINE 1 %-1:100000 IJ SOLN
INTRAMUSCULAR | Status: AC
  Filled 2019-04-06: qty 1

## 2019-04-06 MED ORDER — LIDOCAINE-EPINEPHRINE 2 %-1:100000 IJ SOLN
5.0000 mL | Freq: Once | INTRAMUSCULAR | Status: DC
  Filled 2019-04-06: qty 5.1

## 2019-04-06 MED ORDER — TETANUS-DIPHTH-ACELL PERTUSSIS 5-2.5-18.5 LF-MCG/0.5 IM SUSP
0.5000 mL | Freq: Once | INTRAMUSCULAR | Status: AC
  Administered 2019-04-06: 0.5 mL via INTRAMUSCULAR
  Filled 2019-04-06: qty 0.5

## 2019-04-06 NOTE — ED Provider Notes (Signed)
[facility] EMERGENCY DEPARTMENT Provider Note   CSN: [id number] Arrival date & time: 04/06/19  11-Jul-1432     History Chief Complaint  Patient presents with  . Head Injury    [name] is a 85 y.o. female with history of CAD s/p MI, renal artery stenosis s/p stenting, stroke on Plavix presents [facility] for evaluation of witnessed mechanical fall.  History obtained directly from patient who states she was on her way to the bathroom with her walker that accidentally got stuck and she fell backwards landing directly on her buttocks and hitting the back of her head on the base of the door nearby.  Reports initial local posterior head but denies any significant pain anywhere.  No headache.  Denies any LOC.  She was able to stand up with assistance of staff and has been ambulatory since.  Denies any other physical injuries.  No headache, vision changes, dizziness or any other complaints.  No interventions.  No modifying factors.  Unknown last tetanus.  States she has been feeling at her baseline without any medical issues lately.   HPI     Past Medical History:  Diagnosis Date  . Bradycardia    a. Coreg decreased due to HR low 40s on event monitor 2015/07/12 -> further decreased due to HR upper 30s in 01/2016.  [name] CAD (coronary artery disease)    a. RV infarct 07-11-05 c/b high grade AV block with total occlusion of RCA, unable to treat with PCI. b. Nuc 10/2015: scar but no ischemia.  . Carotid artery disease ([place])   . Chronic anemia   . Chronic lower back pain   . CKD (chronic kidney disease), stage III   . Complication of anesthesia    "almost died when I had my breast reduction"  . Dyslipidemia   . GERD (gastroesophageal reflux disease)   . Heart murmur   . High cholesterol   . History of hiatal hernia   . HTN (hypertension)   . Hyperthyroidism   . Meniere disease    "for years" takes Antivert 3 times per day (01/31/2016)  . Myocardial  infarction [facility]) 07/11/2005   "almost died"  . Near syncope   . Osteoporosis   . Renal artery stenosis (HCC)    a. left renal artery stent 2010/07/12. b. s/p repeat left renal stenting 01/2016.  . Stroke [facility])    a. Prior infarcts seen on head CT.  [name] Type II diabetes mellitus ([place])    "controlled w/diet and exercise only" (01/31/2016)    Patient Active Problem List   Diagnosis Date Noted  . Fall at home, initial encounter 02/15/2019  . Breast nodule 02/15/2019  . Memory problem 02/15/2019  . Pain 12/28/2017  . Primary osteoarthritis of left hand 12/28/2017  . TMJ pain dysfunction syndrome 08/26/2016  . Angina at rest [facility]) 06/15/2016  . Hypertensive urgency 06/15/2016  . Carotid artery disease ([place]) 02/27/2016  . Bradycardia 02/01/2016  . Left renal artery stenosis ([place]) 01/31/2016  . Presbycusis of both ears 01/29/2016  . BPPV (benign paroxysmal positional vertigo) 01/08/2016  . Impacted cerumen of right ear 01/08/2016  . Compression fracture of L1 lumbar vertebra (HCC) 04/23/2015  . T12 compression fracture ([place]) 03/20/2015  . Hyperthyroidism 03/11/2015  . Thyroid nodule 03/11/2015  . Syncope 03/08/2015  . Hypokalemia 03/08/2015  . Weakness 02/26/2015  . Compression fracture of T12 vertebra ([place]) 02/26/2015  . CKD (chronic kidney disease), stage III 02/26/2015  .  Multiple allergies 02/26/2015  . Abdominal pain 02/04/2015  . Hyponatremia 02/04/2015  . DM (diabetes mellitus) ([place]) 12/21/2011  . CAD (coronary artery disease) 12/21/2011  . HTN (hypertension) 12/21/2011  . Hyperlipidemia 12/21/2011  . GERD (gastroesophageal reflux disease) 12/21/2011    Past Surgical History:  Procedure Laterality Date  . ANTERIOR CERVICAL DECOMP/DISCECTOMY FUSION  02/2002   Archie Endo 08/26/2010  . APPENDECTOMY    . BACK SURGERY    . BLADDER SUSPENSION  x3  . BREAST LUMPECTOMY  11/2007   breast needle-localized lumpectomy/notes 08/12/2012  . CARDIAC CATHETERIZATION  2007   "couldn't get stents in;  I almost died'  . CATARACT EXTRACTION W/ INTRAOCULAR LENS  IMPLANT, BILATERAL Bilateral   . CATARACT EXTRACTION W/ INTRAOCULAR LENS  IMPLANT, BILATERAL Bilateral   . CHOLECYSTECTOMY OPEN    . FRACTURE SURGERY    . HIP FRACTURE SURGERY Right 2011   "added rod in my leg and a ball"  . KYPHOPLASTY N/A 03/20/2015   Procedure: THORACIC TWELVE KYPHOPLASTY;  Surgeon: Jovita Gamma, MD;  Location: [place] NEURO ORS;  Service: Neurosurgery;  Laterality: N/A;  . KYPHOPLASTY N/A 04/23/2015   Procedure: KYPHOPLASTY - LUMBAR ONE;  Surgeon: Jovita Gamma, MD;  Location: [place] NEURO ORS;  Service: Neurosurgery;  Laterality: N/A;  L1 Kyphoplasty  . NECK HARDWARE REMOVAL  11/2002   "replaced screw"  . PERIPHERAL VASCULAR CATHETERIZATION Left 12/20/2014   Procedure: Renal Angiography;  Surgeon: Wellington Hampshire, MD;  Location: [place] CV LAB;  Service: Cardiovascular;  Laterality: Left;  . PERIPHERAL VASCULAR CATHETERIZATION N/A 01/31/2016   Procedure: Renal Angiography;  Surgeon: Lorretta Harp, MD;  Location: [place] CV LAB;  Service: Cardiovascular;  Laterality: N/A;  . PERIPHERAL VASCULAR CATHETERIZATION N/A 01/31/2016   Procedure: Abdominal Aortogram;  Surgeon: Lorretta Harp, MD;  Location: [place] CV LAB;  Service: Cardiovascular;  Laterality: N/A;  . PERIPHERAL VASCULAR CATHETERIZATION  01/31/2016   Procedure: Peripheral Vascular Intervention;  Surgeon: Lorretta Harp, MD;  Location: [place] CV LAB;  Service: Cardiovascular;;  . REDUCTION MAMMAPLASTY Bilateral 1985  . renal arteriogram  12/2014   occluded previous Lt renal stent and patent rt renal artery.  [name] RENAL ARTERY STENT Left 2012  . TONSILLECTOMY    . TUBAL LIGATION    . VAGINAL HYSTERECTOMY  ~ 1995  . WRIST FRACTURE SURGERY Left 2012     OB History   No obstetric history on file.     Family History  Problem Relation Age of Onset  . Diabetes type II Mother   . Heart attack Mother   . Heart disease Mother   .  Congestive Heart Failure Father   . Pulmonary embolism Father   . Alzheimer's disease Sister   . Heart disease Sister   . Heart disease Brother   . Healthy Sister   . Healthy Sister   . Stroke Brother   . Dementia Brother        VASCULAR  . Stroke Brother   . Diabetes type II Brother   . Other Brother        KILLED IN PLANE CRASH  . Pulmonary disease Sister   . Heart attack Sister   . Kidney failure Sister   . Diabetes type II Sister   . Diabetes type II Sister   . Kidney failure Sister     Social History   Tobacco Use  . Smoking status: Former Smoker    Packs/day: 1.00    Years: 30.00  Pack years: 30.00    Types: Cigarettes  . Smokeless tobacco: Never Used  . Tobacco comment: "quit smoking in 1989"  Substance Use Topics  . Alcohol use: No    Alcohol/week: 0.0 standard drinks  . Drug use: No    Home Medications Prior to Admission medications   Medication Sig Start Date End Date Taking? Authorizing Provider  acetaminophen (TYLENOL) 500 MG tablet Take 1,000 mg by mouth every 8 (eight) hours as needed for mild pain (for pain.).     [provider]  ALPRAZolam Duanne Moron) 0.25 MG tablet Take 0.125 mg by mouth at bedtime as needed for anxiety or sleep.    [provider]  aspirin 81 MG tablet Take 1 tablet (81 mg total) by mouth daily. 02/01/16   Dunn, Nedra Hai, PA-C  azelastine (ASTELIN) 0.1 % nasal spray Place 1-2 sprays into both nostrils daily as needed. For allergies/congestion. 12/14/15   [provider]  betamethasone dipropionate (DIPROLENE) 0.05 % cream Apply 1 application topically daily as needed (Skin care).  12/10/18   [provider]  cholecalciferol (VITAMIN D) 1000 UNITS tablet Take 2,000 Units by mouth daily.     [provider]  cloNIDine (CATAPRES) 0.1 MG tablet TAKE 1 TABLET BY MOUTH EVERY MORNING AND 2 TABLETS IN THE EVENING Patient taking differently: Take 0.1-0.2 mg by mouth See admin instructions. Take 1 tablet  bu mouth every morning and 2 tablets by mouth in the evening. 10/17/16   Lorretta Harp, MD  clopidogrel (PLAVIX) 75 MG tablet Take 1 tablet (75 mg total) by mouth daily. Please make overdue appt for future refills. [phone]. 2nd attempt. Patient taking differently: Take 75 mg by mouth daily.  04/20/18   Belva Crome, MD  denosumab (PROLIA) 60 MG/ML SOLN injection Inject 60 mg into the skin every 6 (six) months. Administer in upper arm, thigh, or abdomen    [provider]  docusate sodium (COLACE) 100 MG capsule Take 200 mg by mouth at bedtime as needed for mild constipation.    [provider]  fexofenadine (ALLEGRA) 180 MG tablet Take 180 mg by mouth daily.    [provider]  furosemide (LASIX) 20 MG tablet Take 1 tablet (20 mg total) by mouth 3 (three) times a week. Mon, Wed, and Fri only. 02/28/19 05/29/19  Richardson Dopp T, PA-C  HYDROcodone-acetaminophen (NORCO/VICODIN) 5-325 MG tablet Take 1 tablet by mouth every 6 (six) hours as needed. 03/01/19   [provider]  metFORMIN (GLUCOPHAGE-XR) 500 MG 24 hr tablet Take 500 mg by mouth daily with supper.  11/28/18   [provider]  methimazole (TAPAZOLE) 5 MG tablet Take 5 mg by mouth daily. 08/27/15   [provider]  nitroGLYCERIN (NITROSTAT) 0.4 MG SL tablet Place 0.4 mg under the tongue every 5 (five) minutes as needed for chest pain (3 doses MAX).    [provider]  OneTouch Delica Lancets 99991111 MISC USE AS DIRECTED TO CHECK BLOOD SUGAR DAILY AND AS NEEDED E11.9 12/04/18   [provider]  ONETOUCH ULTRA test strip USE AS DIRECTED TO CHECK BLOOD SUGARS DAILY AS NEEDED 11/19/18   [provider]  pantoprazole (PROTONIX) 40 MG tablet TAKE 1 TABLET BY MOUTH TWICE A DAY BEFORE A MEAL Patient taking differently: Take 40 mg by mouth 2 (two) times daily.  03/24/16   Dunn, Nedra Hai, PA-C  Polyethyl Glycol-Propyl Glycol (SYSTANE FREE OP) Place 2 drops into both eyes at  bedtime.  [provider]  telmisartan (MICARDIS) 20 MG tablet Take 0.5 tablets (10 mg total) by mouth daily. 02/25/19   Richardson Dopp T, PA-C    Allergies    Fentanyl, Fish allergy, Morphine and related, Singulair [montelukast], Actonel [risedronate sodium], Dilaudid [hydromorphone hcl], Fosamax [alendronate sodium], Hydralazine, Lipitor [atorvastatin], Lisinopril, Micardis [telmisartan], Septra [sulfamethoxazole-trimethoprim], Tekturna [aliskiren], Tricor [fenofibrate], Imdur [isosorbide dinitrate], Amlodipine, Avapro [irbesartan], Cardio complete [nutritional supplements], Cardizem [diltiazem hcl], Ciprofloxacin, Codeine, Cyclobenzaprine, Forteo [parathyroid hormone (recomb)], Inapsine [droperidol], Ivp dye [iodinated diagnostic agents], Shellfish allergy, and Tussionex pennkinetic er [hydrocod polst-cpm polst er]  Review of Systems   Review of Systems  Skin: Positive for wound.  Hematological: Bruises/bleeds easily.  All other systems reviewed and are negative.   Physical Exam Updated Vital Signs BP (!) 189/72 (BP Location: Right Arm)   Pulse 80   Temp 98.3 F (36.8 C)   Resp 16   Ht 5' (1.524 m)   Wt 46.5 kg   SpO2 96%   BMI 20.04 kg/m   Physical Exam Vitals and nursing note reviewed.  Constitutional:      General: She is not in acute distress.    Appearance: She is well-developed.     Comments: NAD.  HENT:     Head: Normocephalic. Laceration present.     Comments: Approximately 3 cm laceration vertically to the occipital prominence with contusion, oozing blood.  No significant tenderness.  No other signs of scalp trauma or injury.  No facial or nasal bone tenderness.    Right Ear: External ear normal.     Left Ear: External ear normal.     Ears:     Comments: TMs normal.  No hemotympanum.  No battle signs.    Nose: Nose normal.     Mouth/Throat:     Comments: No intraoral or tongue injury.  Dentures stable. Eyes:     General: No scleral icterus.     Conjunctiva/sclera: Conjunctivae normal.     Comments: No raccoons eyes.  No periorbital bone tenderness.  Neck:     Comments: C-spine: Patient arrives without cervical collar.  No midline or paraspinal muscle tenderness.  Full range of motion of the neck without any pain. Cardiovascular:     Rate and Rhythm: Normal rate and regular rhythm.     Heart sounds: Murmur present.  Pulmonary:     Effort: Pulmonary effort is normal.     Breath sounds: Normal breath sounds.  Musculoskeletal:        General: No deformity. Normal range of motion.     Cervical back: Normal range of motion and neck supple.     Comments: Patient ambulatory onto the stretcher.  TL spine: No midline or paraspinal muscle tenderness.  No bruising in the back.  Pelvis: No tenderness over bony prominences of the hips bilaterally.  No pubic symphysis tenderness or obvious separation.  Full range of motion of the hips without any pain.  Negative logroll bilaterally. No tenderness over buttocks or signs of injury/ecchymosis or wounds   Skin:    General: Skin is warm and dry.     Capillary Refill: Capillary refill takes less than 2 seconds.  Neurological:     Mental Status: She is alert and oriented to person, place, and time.     Comments:   Mental Status: Patient is awake, alert, oriented to person, place, year, and situation. Patient is able to give a clear and coherent history. Speech is fluent and clear without dysarthria or aphasia. No signs  of neglect.  Cranial Nerves: I not tested II visual fields full bilaterally. PERRL.  Unable to visualize posterior eye. III, IV, VI EOMs intact without ptosis or diplopia  V sensation to light touch intact in all 3 divisions of trigeminal nerve bilaterally  VII facial movements symmetric bilaterally VIII hearing intact to voice/conversation  IX, X no uvula deviation, symmetric rise of soft palate/uvula XI 5/5 SCM and trapezius strength bilaterally  XII tongue protrusion  midline, symmetric L/R movements  Motor: Strength 5/5 in upper/lower extremities .   Sensation to light touch intact in face, upper/lower extremities. No pronator drift. No leg drop.  Cerebellar: No ataxia with finger to nose.   Psychiatric:        Behavior: Behavior normal.        Thought Content: Thought content normal.        Judgment: Judgment normal.     ED Results / Procedures / Treatments   Labs (all labs ordered are listed, but only abnormal results are displayed) Labs Reviewed - No data to display  EKG None  Radiology CT Head Wo Contrast  Result Date: 04/06/2019 CLINICAL DATA:  Fall, head trauma EXAM: CT HEAD WITHOUT CONTRAST TECHNIQUE: Contiguous axial images were obtained from the base of the skull through the vertex without intravenous contrast. COMPARISON:  02/15/2019 FINDINGS: Brain: There is no acute intracranial hemorrhage, mass-effect, or edema. Gray-white differentiation is preserved. There is no extra-axial fluid collection. Mild prominence of the ventricles and sulci reflects stable parenchymal volume loss. Patchy and confluent hypoattenuation in the supratentorial white matter likely reflects stable chronic microvascular ischemic changes. Vascular: There is atherosclerotic calcification at the skull base. Skull: Calvarium is unremarkable. Sinuses/Orbits: No acute finding. Other: Posterior scalp soft tissue swelling with overlying gauze. IMPRESSION: No evidence of acute intracranial injury. Stable chronic findings detailed above. Electronically Signed   By: Macy Mis M.D.   On: 04/06/2019 15:32    Procedures .Marland KitchenLaceration Repair  Date/Time: 04/06/2019 4:43 PM Performed by: Kinnie Feil, PA-C Authorized by: Kinnie Feil, PA-C   Consent:    Consent obtained:  Verbal   Consent given by:  Patient   Risks discussed:  Infection, need for additional repair, pain, poor cosmetic result and poor wound healing   Alternatives discussed:  No  treatment Universal protocol:    Procedure explained and questions answered to patient or proxy's satisfaction: yes     Relevant documents present and verified: yes     Test results available and properly labeled: yes     Imaging studies available: yes     Required blood products, implants, devices, and special equipment available: yes     Site/side marked: yes     Immediately prior to procedure, a time out was called: yes     Patient identity confirmed:  Verbally with patient Anesthesia (see MAR for exact dosages):    Anesthesia method:  Local infiltration   Local anesthetic:  Lidocaine 2% WITH epi Laceration details:    Location:  Scalp   Scalp location:  Occipital   Length (cm):  3 Repair type:    Repair type:  Simple Pre-procedure details:    Preparation:  Patient was prepped and draped in usual sterile fashion Exploration:    Hemostasis achieved with:  Epinephrine   Wound extent: no muscle damage noted, no underlying fracture noted and no vascular damage noted     Contaminated: no   Treatment:    Area cleansed with:  Shur-Clens   Amount of cleaning:  Standard Skin repair:    Repair method:  Staples   Number of staples:  4 Approximation:    Approximation:  Close Post-procedure details:    Dressing:  Non-adherent dressing   Patient tolerance of procedure:  Tolerated well, no immediate complications   (including critical care time)  Medications Ordered in ED Medications  lidocaine-EPINEPHrine (XYLOCAINE W/EPI) 2 %-1:100000 (with pres) injection 5 mL (has no administration in time range)  lidocaine-EPINEPHrine (XYLOCAINE W/EPI) 2 %-1:200000 (PF) injection (has no administration in time range)  Tdap (BOOSTRIX) injection 0.5 mL (0.5 mLs Intramuscular Given 04/06/19 1519)    ED Course  I have reviewed the triage vital signs and the nursing notes.  Pertinent labs & imaging results that were available during my care of the patient were reviewed by me and considered in my  medical decision making (see chart for details).    MDM Rules/Calculators/A&P                      85 y.o. yo female here after mechanical fall and scalp laceration.  Denies any other symptoms.   On plavix.   HD stable on arrival.  Alert. No obvious signs of significant CTL-spine, chest, abdominal, pelvis, extremity injury. Given symptoms, exam will obtain imaging.   Head CT is normal.  Result given to patient.  Per EMT patient reporting dizziness with attempting to walk.  Pt states she feels a little "shaky" and is afraid to fall but to me denies light headedness, CP, palpitations, dizziness.  She is afraid to fall again.  She is a retired Therapist, sports and comfortable being discharged.    Appropriate for discharge with wound care instructions and staple removal  Return precautions given. Symptomatic tx and f/u with PCP for re-evaluation for persistent symptoms. Pt in agreement with ER tx and discharge plan. Shared with EDP.   Final Clinical Impression(s) / ED Diagnoses Final diagnoses:  Fall, initial encounter  Laceration of scalp, initial encounter    Rx / DC Orders ED Discharge Orders    None       Arlean Hopping 04/06/19 1702    Wyvonnia Dusky, MD 04/06/19 1921

## 2019-04-06 NOTE — ED Triage Notes (Signed)
Pt brought in by EMS from [facility] , witnessed fall no LOC , pt is on plavix

## 2019-04-06 NOTE — ED Notes (Signed)
Patient transported to CT 

## 2019-04-06 NOTE — ED Notes (Signed)
PT ambulated to the bathroom with steady gait and 1 person assistance. PT reported dizziness but successfully ambulated to the bathroom and back with assistance.

## 2019-04-06 NOTE — ED Notes (Signed)
Called pt's husband to update on status and relayed that pt was up for discharge. Mr. Bernardin is calling their son to come pick up patient and is appreciative.

## 2019-04-06 NOTE — ED Notes (Signed)
Pt very dizzy, portable toilet in room

## 2019-04-06 NOTE — ED Notes (Signed)
ED Provider at bedside. 

## 2019-04-06 NOTE — Discharge Instructions (Addendum)
You were seen in the ER after a fall  Laceration was repaired with 4 staples  Keep the wound dry for the next 24 hours.  You can then rinse with water, tap dry and apply antibiotic ointment.  Use ice to help with swelling.  Alternate Tylenol every 6-8 hours for local pain.  Staples need to be removed in the next 7 to 10 days.  Return to the ER for signs of infection like swelling redness warmth pus fevers or any other concerning symptoms

## 2019-04-07 ENCOUNTER — Telehealth: Payer: Self-pay

## 2019-04-07 ENCOUNTER — Emergency Department (HOSPITAL_BASED_OUTPATIENT_CLINIC_OR_DEPARTMENT_OTHER): Payer: PPO

## 2019-04-07 ENCOUNTER — Other Ambulatory Visit: Payer: Self-pay

## 2019-04-07 ENCOUNTER — Emergency Department (HOSPITAL_BASED_OUTPATIENT_CLINIC_OR_DEPARTMENT_OTHER)
Admission: EM | Admit: 2019-04-07 | Discharge: 2019-04-08 | Disposition: A | Discharge status: Home / Self Care | Payer: PPO | Attending: Emergency Medicine | Admitting: Emergency Medicine

## 2019-04-07 ENCOUNTER — Encounter (HOSPITAL_BASED_OUTPATIENT_CLINIC_OR_DEPARTMENT_OTHER): Payer: Self-pay | Admitting: Emergency Medicine

## 2019-04-07 DIAGNOSIS — S0990XA Unspecified injury of head, initial encounter: Secondary | ICD-10-CM | POA: Diagnosis not present

## 2019-04-07 DIAGNOSIS — Y939 Activity, unspecified: Secondary | ICD-10-CM | POA: Insufficient documentation

## 2019-04-07 DIAGNOSIS — S161XXA Strain of muscle, fascia and tendon at neck level, initial encounter: Secondary | ICD-10-CM | POA: Insufficient documentation

## 2019-04-07 DIAGNOSIS — S199XXA Unspecified injury of neck, initial encounter: Secondary | ICD-10-CM | POA: Diagnosis not present

## 2019-04-07 DIAGNOSIS — W19XXXA Unspecified fall, initial encounter: Secondary | ICD-10-CM | POA: Diagnosis not present

## 2019-04-07 DIAGNOSIS — Z87891 Personal history of nicotine dependence: Secondary | ICD-10-CM | POA: Diagnosis not present

## 2019-04-07 DIAGNOSIS — I2510 Atherosclerotic heart disease of native coronary artery without angina pectoris: Secondary | ICD-10-CM | POA: Insufficient documentation

## 2019-04-07 DIAGNOSIS — Z8673 Personal history of transient ischemic attack (TIA), and cerebral infarction without residual deficits: Secondary | ICD-10-CM | POA: Insufficient documentation

## 2019-04-07 DIAGNOSIS — S300XXA Contusion of lower back and pelvis, initial encounter: Secondary | ICD-10-CM | POA: Insufficient documentation

## 2019-04-07 DIAGNOSIS — I252 Old myocardial infarction: Secondary | ICD-10-CM | POA: Diagnosis not present

## 2019-04-07 DIAGNOSIS — I129 Hypertensive chronic kidney disease with stage 1 through stage 4 chronic kidney disease, or unspecified chronic kidney disease: Secondary | ICD-10-CM | POA: Insufficient documentation

## 2019-04-07 DIAGNOSIS — Z7902 Long term (current) use of antithrombotics/antiplatelets: Secondary | ICD-10-CM | POA: Insufficient documentation

## 2019-04-07 DIAGNOSIS — W01198A Fall on same level from slipping, tripping and stumbling with subsequent striking against other object, initial encounter: Secondary | ICD-10-CM | POA: Diagnosis not present

## 2019-04-07 DIAGNOSIS — I10 Essential (primary) hypertension: Secondary | ICD-10-CM | POA: Diagnosis not present

## 2019-04-07 DIAGNOSIS — Z79899 Other long term (current) drug therapy: Secondary | ICD-10-CM | POA: Diagnosis not present

## 2019-04-07 DIAGNOSIS — S0101XA Laceration without foreign body of scalp, initial encounter: Secondary | ICD-10-CM | POA: Diagnosis not present

## 2019-04-07 DIAGNOSIS — Y929 Unspecified place or not applicable: Secondary | ICD-10-CM | POA: Insufficient documentation

## 2019-04-07 DIAGNOSIS — Z7982 Long term (current) use of aspirin: Secondary | ICD-10-CM | POA: Insufficient documentation

## 2019-04-07 DIAGNOSIS — Z7984 Long term (current) use of oral hypoglycemic drugs: Secondary | ICD-10-CM | POA: Diagnosis not present

## 2019-04-07 DIAGNOSIS — N183 Chronic kidney disease, stage 3 unspecified: Secondary | ICD-10-CM | POA: Insufficient documentation

## 2019-04-07 DIAGNOSIS — R296 Repeated falls: Secondary | ICD-10-CM | POA: Diagnosis not present

## 2019-04-07 DIAGNOSIS — Y999 Unspecified external cause status: Secondary | ICD-10-CM | POA: Diagnosis not present

## 2019-04-07 DIAGNOSIS — E1122 Type 2 diabetes mellitus with diabetic chronic kidney disease: Secondary | ICD-10-CM | POA: Insufficient documentation

## 2019-04-07 DIAGNOSIS — S0003XA Contusion of scalp, initial encounter: Secondary | ICD-10-CM | POA: Diagnosis not present

## 2019-04-07 NOTE — ED Notes (Signed)
Imaging after bleeding is controlled.

## 2019-04-07 NOTE — ED Notes (Signed)
Pt transported to CT ?

## 2019-04-07 NOTE — ED Notes (Signed)
Pt head bleeding through guauze/coban- EDP to bedside to eval. Staples and quickclot applied. Dressing reapplied.

## 2019-04-07 NOTE — Telephone Encounter (Signed)
Patient called and stated she recently got hurt and was taken to ED where they placed 4 staples in her head. She states a CT was performed and everything was fine. She denied pain. She states staples need to be removed 7-10 days after placed and would like to know if she can have staples removed at clinic.  Please advise.

## 2019-04-07 NOTE — ED Notes (Signed)
RN spoke with pt husband and updated with plan of care.

## 2019-04-07 NOTE — ED Provider Notes (Signed)
[facility] Provider Note   CSN: [id number] Arrival date & time: 04/07/19  03-Aug-2203     History Chief Complaint  Patient presents with  . Fall    [name] is a 85 y.o. female.  Patient is a an 85 year old female with past medical history coronary artery disease on Plavix, hypertension, hyperlipidemia, chronic renal insufficiency.  She presents today for evaluation of fall.  Patient became dizzy this evening, then fell backward and hit the back of her head.  Patient had a fall yesterday and was seen here.  She had a CT scan and staples placed.  Tonight's fall appears to have caused this laceration to reopen.  Patient denies loss of consciousness or headache.  She denies neck pain.  She does report pain in her buttock.  She was able to stand and walk after the fall.  The history is provided by the patient.  Fall This is a new problem. The current episode started 1 to 2 hours ago. The problem occurs constantly. The problem has not changed since onset.Associated symptoms include headaches. Nothing aggravates the symptoms. Nothing relieves the symptoms. She has tried nothing for the symptoms.       Past Medical History:  Diagnosis Date  . Bradycardia    a. Coreg decreased due to HR low 40s on event monitor Aug 03, 2015 -> further decreased due to HR upper 30s in 01/2016.  [name] CAD (coronary artery disease)    a. RV infarct 2005-08-02 c/b high grade AV block with total occlusion of RCA, unable to treat with PCI. b. Nuc 10/2015: scar but no ischemia.  . Carotid artery disease ([place])   . Chronic anemia   . Chronic lower back pain   . CKD (chronic kidney disease), stage III   . Complication of anesthesia    "almost died when I had my breast reduction"  . Dyslipidemia   . GERD (gastroesophageal reflux disease)   . Heart murmur   . High cholesterol   . History of hiatal hernia   . HTN (hypertension)   . Hyperthyroidism   . Meniere disease    "for years" takes Antivert  3 times per day (01/31/2016)  . Myocardial infarction [facility]) 08/02/2005   "almost died"  . Near syncope   . Osteoporosis   . Renal artery stenosis (HCC)    a. left renal artery stent 08-03-2010. b. s/p repeat left renal stenting 01/2016.  . Stroke [facility])    a. Prior infarcts seen on head CT.  [name] Type II diabetes mellitus ([place])    "controlled w/diet and exercise only" (01/31/2016)    Patient Active Problem List   Diagnosis Date Noted  . Fall at home, initial encounter 02/15/2019  . Breast nodule 02/15/2019  . Memory problem 02/15/2019  . Pain 12/28/2017  . Primary osteoarthritis of left hand 12/28/2017  . TMJ pain dysfunction syndrome 08/26/2016  . Angina at rest [facility]) 06/15/2016  . Hypertensive urgency 06/15/2016  . Carotid artery disease ([place]) 02/27/2016  . Bradycardia 02/01/2016  . Left renal artery stenosis ([place]) 01/31/2016  . Presbycusis of both ears 01/29/2016  . BPPV (benign paroxysmal positional vertigo) 01/08/2016  . Impacted cerumen of right ear 01/08/2016  . Compression fracture of L1 lumbar vertebra (HCC) 04/23/2015  . T12 compression fracture ([place]) 03/20/2015  . Hyperthyroidism 03/11/2015  . Thyroid nodule 03/11/2015  . Syncope 03/08/2015  . Hypokalemia 03/08/2015  . Weakness 02/26/2015  . Compression fracture of T12 vertebra ([place]) 02/26/2015  . CKD (  chronic kidney disease), stage III 02/26/2015  . Multiple allergies 02/26/2015  . Abdominal pain 02/04/2015  . Hyponatremia 02/04/2015  . DM (diabetes mellitus) ([place]) 12/21/2011  . CAD (coronary artery disease) 12/21/2011  . HTN (hypertension) 12/21/2011  . Hyperlipidemia 12/21/2011  . GERD (gastroesophageal reflux disease) 12/21/2011    Past Surgical History:  Procedure Laterality Date  . ANTERIOR CERVICAL DECOMP/DISCECTOMY FUSION  02/2002   Archie Endo 08/26/2010  . APPENDECTOMY    . BACK SURGERY    . BLADDER SUSPENSION  x3  . BREAST LUMPECTOMY  11/2007   breast needle-localized lumpectomy/notes 08/12/2012  . CARDIAC  CATHETERIZATION  2007   "couldn't get stents in; I almost died'  . CATARACT EXTRACTION W/ INTRAOCULAR LENS  IMPLANT, BILATERAL Bilateral   . CATARACT EXTRACTION W/ INTRAOCULAR LENS  IMPLANT, BILATERAL Bilateral   . CHOLECYSTECTOMY OPEN    . FRACTURE SURGERY    . HIP FRACTURE SURGERY Right 2011   "added rod in my leg and a ball"  . KYPHOPLASTY N/A 03/20/2015   Procedure: THORACIC TWELVE KYPHOPLASTY;  Surgeon: Jovita Gamma, MD;  Location: [place] NEURO ORS;  Service: Neurosurgery;  Laterality: N/A;  . KYPHOPLASTY N/A 04/23/2015   Procedure: KYPHOPLASTY - LUMBAR ONE;  Surgeon: Jovita Gamma, MD;  Location: [place] NEURO ORS;  Service: Neurosurgery;  Laterality: N/A;  L1 Kyphoplasty  . NECK HARDWARE REMOVAL  11/2002   "replaced screw"  . PERIPHERAL VASCULAR CATHETERIZATION Left 12/20/2014   Procedure: Renal Angiography;  Surgeon: Wellington Hampshire, MD;  Location: [place] CV LAB;  Service: Cardiovascular;  Laterality: Left;  . PERIPHERAL VASCULAR CATHETERIZATION N/A 01/31/2016   Procedure: Renal Angiography;  Surgeon: Lorretta Harp, MD;  Location: [place] CV LAB;  Service: Cardiovascular;  Laterality: N/A;  . PERIPHERAL VASCULAR CATHETERIZATION N/A 01/31/2016   Procedure: Abdominal Aortogram;  Surgeon: Lorretta Harp, MD;  Location: [place] CV LAB;  Service: Cardiovascular;  Laterality: N/A;  . PERIPHERAL VASCULAR CATHETERIZATION  01/31/2016   Procedure: Peripheral Vascular Intervention;  Surgeon: Lorretta Harp, MD;  Location: [place] CV LAB;  Service: Cardiovascular;;  . REDUCTION MAMMAPLASTY Bilateral 1985  . renal arteriogram  12/2014   occluded previous Lt renal stent and patent rt renal artery.  [name] RENAL ARTERY STENT Left 2012  . TONSILLECTOMY    . TUBAL LIGATION    . VAGINAL HYSTERECTOMY  ~ 1995  . WRIST FRACTURE SURGERY Left 2012     OB History   No obstetric history on file.     Family History  Problem Relation Age of Onset  . Diabetes type II Mother   . Heart  attack Mother   . Heart disease Mother   . Congestive Heart Failure Father   . Pulmonary embolism Father   . Alzheimer's disease Sister   . Heart disease Sister   . Heart disease Brother   . Healthy Sister   . Healthy Sister   . Stroke Brother   . Dementia Brother        VASCULAR  . Stroke Brother   . Diabetes type II Brother   . Other Brother        KILLED IN PLANE CRASH  . Pulmonary disease Sister   . Heart attack Sister   . Kidney failure Sister   . Diabetes type II Sister   . Diabetes type II Sister   . Kidney failure Sister     Social History   Tobacco Use  . Smoking status: Former Smoker    Packs/day:  1.00    Years: 30.00    Pack years: 30.00    Types: Cigarettes  . Smokeless tobacco: Never Used  . Tobacco comment: "quit smoking in 1989"  Substance Use Topics  . Alcohol use: No    Alcohol/week: 0.0 standard drinks  . Drug use: No    Home Medications Prior to Admission medications   Medication Sig Start Date End Date Taking? Authorizing Provider  acetaminophen (TYLENOL) 500 MG tablet Take 1,000 mg by mouth every 8 (eight) hours as needed for mild pain (for pain.).     [provider]  ALPRAZolam Duanne Moron) 0.25 MG tablet Take 0.125 mg by mouth at bedtime as needed for anxiety or sleep.    [provider]  aspirin 81 MG tablet Take 1 tablet (81 mg total) by mouth daily. 02/01/16   Dunn, Nedra Hai, PA-C  azelastine (ASTELIN) 0.1 % nasal spray Place 1-2 sprays into both nostrils daily as needed. For allergies/congestion. 12/14/15   [provider]  betamethasone dipropionate (DIPROLENE) 0.05 % cream Apply 1 application topically daily as needed (Skin care).  12/10/18   [provider]  cholecalciferol (VITAMIN D) 1000 UNITS tablet Take 2,000 Units by mouth daily.     [provider]  cloNIDine (CATAPRES) 0.1 MG tablet TAKE 1 TABLET BY MOUTH EVERY MORNING AND 2 TABLETS IN THE EVENING Patient taking differently: Take 0.1-0.2 mg by  mouth See admin instructions. Take 1 tablet bu mouth every morning and 2 tablets by mouth in the evening. 10/17/16   Lorretta Harp, MD  clopidogrel (PLAVIX) 75 MG tablet Take 1 tablet (75 mg total) by mouth daily. Please make overdue appt for future refills. [phone]. 2nd attempt. Patient taking differently: Take 75 mg by mouth daily.  04/20/18   Belva Crome, MD  denosumab (PROLIA) 60 MG/ML SOLN injection Inject 60 mg into the skin every 6 (six) months. Administer in upper arm, thigh, or abdomen    [provider]  docusate sodium (COLACE) 100 MG capsule Take 200 mg by mouth at bedtime as needed for mild constipation.    [provider]  fexofenadine (ALLEGRA) 180 MG tablet Take 180 mg by mouth daily.    [provider]  furosemide (LASIX) 20 MG tablet Take 1 tablet (20 mg total) by mouth 3 (three) times a week. Mon, Wed, and Fri only. 02/28/19 05/29/19  Richardson Dopp T, PA-C  HYDROcodone-acetaminophen (NORCO/VICODIN) 5-325 MG tablet Take 1 tablet by mouth every 6 (six) hours as needed. 03/01/19   [provider]  metFORMIN (GLUCOPHAGE-XR) 500 MG 24 hr tablet Take 500 mg by mouth daily with supper.  11/28/18   [provider]  methimazole (TAPAZOLE) 5 MG tablet Take 5 mg by mouth daily. 08/27/15   [provider]  nitroGLYCERIN (NITROSTAT) 0.4 MG SL tablet Place 0.4 mg under the tongue every 5 (five) minutes as needed for chest pain (3 doses MAX).    [provider]  OneTouch Delica Lancets [id number] MISC USE AS DIRECTED TO CHECK BLOOD SUGAR DAILY AND AS NEEDED E11.9 12/04/18   [provider]  ONETOUCH ULTRA test strip USE AS DIRECTED TO CHECK BLOOD SUGARS DAILY AS NEEDED 11/19/18   [provider]  pantoprazole (PROTONIX) 40 MG tablet TAKE 1 TABLET BY MOUTH TWICE A DAY BEFORE A MEAL Patient taking differently: Take 40 mg by mouth 2 (two) times daily.  03/24/16   Dunn, Nedra Hai, PA-C  Polyethyl Glycol-Propyl Glycol (SYSTANE  FREE OP) Place 2  drops into both eyes at bedtime.    [provider]  telmisartan (MICARDIS) 20 MG tablet Take 0.5 tablets (10 mg total) by mouth daily. 02/25/19   Richardson Dopp T, PA-C    Allergies    Fentanyl, Fish allergy, Morphine and related, Singulair [montelukast], Actonel [risedronate sodium], Dilaudid [hydromorphone hcl], Fosamax [alendronate sodium], Hydralazine, Lipitor [atorvastatin], Lisinopril, Micardis [telmisartan], Septra [sulfamethoxazole-trimethoprim], Tekturna [aliskiren], Tricor [fenofibrate], Imdur [isosorbide dinitrate], Amlodipine, Avapro [irbesartan], Cardio complete [nutritional supplements], Cardizem [diltiazem hcl], Ciprofloxacin, Codeine, Cyclobenzaprine, Forteo [parathyroid hormone (recomb)], Inapsine [droperidol], Ivp dye [iodinated diagnostic agents], Shellfish allergy, and Tussionex pennkinetic er [hydrocod polst-cpm polst er]  Review of Systems   Review of Systems  Neurological: Positive for headaches.  All other systems reviewed and are negative.   Physical Exam Updated Vital Signs BP (!) 190/73 (BP Location: Right Arm)   Pulse 93   Temp 97.6 F (36.4 C) (Oral)   Resp 18   SpO2 98%   Physical Exam Vitals and nursing note reviewed.  Constitutional:      General: She is not in acute distress.    Appearance: She is well-developed. She is not diaphoretic.  HENT:     Head: Normocephalic and atraumatic.  Eyes:     Extraocular Movements: Extraocular movements intact.     Pupils: Pupils are equal, round, and reactive to light.  Cardiovascular:     Rate and Rhythm: Normal rate and regular rhythm.     Heart sounds: No murmur. No friction rub. No gallop.   Pulmonary:     Effort: Pulmonary effort is normal. No respiratory distress.     Breath sounds: Normal breath sounds. No wheezing.  Abdominal:     General: Bowel sounds are normal. There is no distension.     Palpations: Abdomen is soft.     Tenderness: There is no abdominal tenderness.    Musculoskeletal:        General: Normal range of motion.     Cervical back: Normal range of motion and neck supple.  Skin:    General: Skin is warm and dry.  Neurological:     General: No focal deficit present.     Mental Status: She is alert and oriented to person, place, and time.     Cranial Nerves: No cranial nerve deficit.     Motor: No weakness.     Coordination: Coordination normal.     ED Results / Procedures / Treatments   Labs (all labs ordered are listed, but only abnormal results are displayed) Labs Reviewed - No data to display  EKG None  Radiology CT Head Wo Contrast  Result Date: 04/06/2019 CLINICAL DATA:  Fall, head trauma EXAM: CT HEAD WITHOUT CONTRAST TECHNIQUE: Contiguous axial images were obtained from the base of the skull through the vertex without intravenous contrast. COMPARISON:  02/15/2019 FINDINGS: Brain: There is no acute intracranial hemorrhage, mass-effect, or edema. Gray-white differentiation is preserved. There is no extra-axial fluid collection. Mild prominence of the ventricles and sulci reflects stable parenchymal volume loss. Patchy and confluent hypoattenuation in the supratentorial white matter likely reflects stable chronic microvascular ischemic changes. Vascular: There is atherosclerotic calcification at the skull base. Skull: Calvarium is unremarkable. Sinuses/Orbits: No acute finding. Other: Posterior scalp soft tissue swelling with overlying gauze. IMPRESSION: No evidence of acute intracranial injury. Stable chronic findings detailed above. Electronically Signed   By: Macy Mis M.D.   On: 04/06/2019 15:32    Procedures Procedures (including critical care time)  Medications Ordered in ED Medications -  No data to display  ED Course  I have reviewed the triage vital signs and the nursing notes.  Pertinent labs & imaging results that were available during my care of the patient were reviewed by me and considered in my medical  decision making (see chart for details).    MDM Rules/Calculators/A&P  Patient is an 85 year old female presenting with complaints of a head injury.  Patient fell this evening for a second evening in a row.  She was seen yesterday and had several staples placed.  This evening she fell backward and struck her head and bleeding resumed again.  Patient arrived here with extensive coagulated blood in her hair which made visualizing the lacerated area difficult.  The area from where the bleeding originated was cleaned and the hair trimmed away.  The lacerated area had 4 staples placed, however bleeding continued and significant bruising from the wound and the staple insertion sites.  Quick clot was eventually applied and pressure held with eventual hemostasis.  At this point, the bleeding has stopped and patient's imaging studies are unremarkable.  She is awake and alert, and neurologically intact.  I feel as though she is stable for discharge.  Patient with a Coban dressing applied and is to leave this in place for the next 24 hours.  Final Clinical Impression(s) / ED Diagnoses Final diagnoses:  None    Rx / DC Orders ED Discharge Orders    None       Veryl Speak, MD 04/08/19 [phone]

## 2019-04-07 NOTE — ED Triage Notes (Addendum)
Pt fell tonight and hit back of head on door hinge and sustained puncture type injury. Pt has staples in back of head from fall last night. EMS reports at least 1 or more of those came out tonight. Bleeding controlled with bandage in place by EMS. Pt also c/o left buttock pain.

## 2019-04-08 DIAGNOSIS — S0003XA Contusion of scalp, initial encounter: Secondary | ICD-10-CM | POA: Diagnosis not present

## 2019-04-08 DIAGNOSIS — S199XXA Unspecified injury of neck, initial encounter: Secondary | ICD-10-CM | POA: Diagnosis not present

## 2019-04-08 DIAGNOSIS — R296 Repeated falls: Secondary | ICD-10-CM | POA: Diagnosis not present

## 2019-04-08 NOTE — Discharge Instructions (Addendum)
Leave dressing in place for the next 24 hours, then remove.   Staples are to be removed in 7 days.  Follow-up with your primary doctor for this.  Return to the emergency department if you experience any new and/or concerning symptoms.

## 2019-04-08 NOTE — ED Notes (Signed)
Pt husband updated with plan of care.

## 2019-04-11 NOTE — Telephone Encounter (Signed)
Yes Mickey Farber can do it. Can you call her Mickey Farber and see when we can schedule in our Clinic For this ?Thanks

## 2019-04-11 NOTE — Telephone Encounter (Signed)
Incoming call received from patients husband requesting to schedule an appointment to have staples removed.  I reviewed chart and it appears that patient is not an established patient yet and her new patient appointment to establish care is pending for January 2020. Appointment was scheduled for this Wednesday at 3:00 pm based on the conversation noted in this encounter where Dr.Gupta states she will remove staples for patient.  Patient was told to have staples removed in 7-10 days and appointment for 04/13/2019 will be 6 days from placement. Mr.Andrades would like to confirm it would be ok to remove on day 6. The other option would be 04/26/2018 or for patient to come to [facility] and Mr.Rahn preference is to have them removed on site at [facility]

## 2019-04-12 ENCOUNTER — Encounter: Payer: Self-pay | Admitting: *Deleted

## 2019-04-13 ENCOUNTER — Non-Acute Institutional Stay: Payer: PPO | Admitting: Internal Medicine

## 2019-04-13 ENCOUNTER — Other Ambulatory Visit: Payer: Self-pay

## 2019-04-13 ENCOUNTER — Encounter: Payer: Self-pay | Admitting: Internal Medicine

## 2019-04-13 VITALS — BP 102/56 | HR 78 | Temp 97.6°F | Ht 60.0 in | Wt 100.6 lb

## 2019-04-13 DIAGNOSIS — S0101XD Laceration without foreign body of scalp, subsequent encounter: Secondary | ICD-10-CM | POA: Diagnosis not present

## 2019-04-13 DIAGNOSIS — H8110 Benign paroxysmal vertigo, unspecified ear: Secondary | ICD-10-CM | POA: Diagnosis not present

## 2019-04-13 DIAGNOSIS — W19XXXD Unspecified fall, subsequent encounter: Secondary | ICD-10-CM

## 2019-04-13 DIAGNOSIS — E0590 Thyrotoxicosis, unspecified without thyrotoxic crisis or storm: Secondary | ICD-10-CM | POA: Diagnosis not present

## 2019-04-13 DIAGNOSIS — I10 Essential (primary) hypertension: Secondary | ICD-10-CM | POA: Diagnosis not present

## 2019-04-13 NOTE — Progress Notes (Signed)
Location: [place] of Service:  Clinic (12)  Provider:   Code Status:  Goals of Care:  Advanced Directives 04/06/2019  Does Patient Have a Medical Advance Directive? No  Type of Advance Directive -  Does patient want to make changes to medical advance directive? -  Copy of [place] in Chart? -  Would patient like information on creating a medical advance directive? -  Pre-existing out of facility DNR order (yellow form or pink MOST form) -     Chief Complaint  Patient presents with  . Medical Management of Chronic Issues    Patient here today to establish care and remove stables. Shs has been having some dizziness.     HPI: Patient is a 85 y.o. female seen today for an acute visit for Establish Care and Remove Staples from her Head  Patient has multiple Medical Problems  Fall Twice Seems Mechanical But she does c/o Dizziness .Sustaining Laceration needing Staples CT scan showed Right Parietal Hematoma with Chronic Microvascular Angiopathy Here for staple Removal Dizziness Is going to see Neurology in Few weeks. Says it is better since she is using Walker Hyperthyroidism Was followed By Dr Laurann Montana On Tapazole Anemia Seems Iron Def Has not had any work up recently CAD Is followed by Dr Tamala Julian Was recently seen For Chest Pain Atypical E Cho showed AS and AI EF preserved BNP elevated  Started on Lasix Low dose Hypertension On Number of Meds H/O Renal And Carotid Artery Stenosis Osteoporosis On Prolia Diabetes Mellitus On Metformin Last A1C was 6.8 Patient is retired Marine scientist. Lives with her husband who said she has some Short tem Memory issues. Now using  Walker to ambulate   Past Medical History:  Diagnosis Date  . BPPV (benign paroxysmal positional vertigo) 01/08/2016  . Bradycardia    a. Coreg decreased due to HR low 40s on event monitor 2015/07/25 -> further decreased due to HR upper 30s in 01/2016.  . Breast nodule  02/15/2019  . CAD (coronary artery disease)    a. RV infarct Jul 24, 2005 c/b high grade AV block with total occlusion of RCA, unable to treat with PCI. b. Nuc 10/2015: scar but no ischemia.  . Carotid artery disease ([place])   . Chronic anemia   . Chronic lower back pain   . CKD (chronic kidney disease), stage III   . Complication of anesthesia    "almost died when I had my breast reduction"  . Dyslipidemia   . GERD (gastroesophageal reflux disease)   . Heart murmur   . High cholesterol   . History of hiatal hernia   . HTN (hypertension)   . Hyperthyroidism   . Memory problem 02/15/2019  . Meniere disease    "for years" takes Antivert 3 times per day (01/31/2016)  . Myocardial infarction [facility]) 2005-07-24   "almost died"  . Near syncope   . Osteoporosis   . Presbycusis of both ears 01/29/2016  . Renal artery stenosis ([facility])    a. left renal artery stent 07/25/10. b. s/p repeat left renal stenting 01/2016.  . Stroke [facility])    a. Prior infarcts seen on head CT.  . TMJ pain dysfunction syndrome 08/26/2016  . Type II diabetes mellitus ([place])    "controlled w/diet and exercise only" (01/31/2016)  . Weakness 02/26/2015    Past Surgical History:  Procedure Laterality Date  . ANTERIOR CERVICAL DECOMP/DISCECTOMY FUSION  02/2002   Archie Endo 08/26/2010  . APPENDECTOMY    .  BACK SURGERY    . BLADDER SUSPENSION  x3  . BREAST LUMPECTOMY  11/2007   breast needle-localized lumpectomy/notes 08/12/2012  . CARDIAC CATHETERIZATION  2007   "couldn't get stents in; I almost died'  . CATARACT EXTRACTION W/ INTRAOCULAR LENS  IMPLANT, BILATERAL Bilateral   . CATARACT EXTRACTION W/ INTRAOCULAR LENS  IMPLANT, BILATERAL Bilateral   . CHOLECYSTECTOMY OPEN    . FRACTURE SURGERY    . HIP FRACTURE SURGERY Right 2011   "added rod in my leg and a ball"  . KYPHOPLASTY N/A 03/20/2015   Procedure: THORACIC TWELVE KYPHOPLASTY;  Surgeon: Jovita Gamma, MD;  Location: [place] NEURO ORS;  Service: Neurosurgery;  Laterality: N/A;  .  KYPHOPLASTY N/A 04/23/2015   Procedure: KYPHOPLASTY - LUMBAR ONE;  Surgeon: Jovita Gamma, MD;  Location: [place] NEURO ORS;  Service: Neurosurgery;  Laterality: N/A;  L1 Kyphoplasty  . NECK HARDWARE REMOVAL  11/2002   "replaced screw"  . PERIPHERAL VASCULAR CATHETERIZATION Left 12/20/2014   Procedure: Renal Angiography;  Surgeon: Wellington Hampshire, MD;  Location: [place] CV LAB;  Service: Cardiovascular;  Laterality: Left;  . PERIPHERAL VASCULAR CATHETERIZATION N/A 01/31/2016   Procedure: Renal Angiography;  Surgeon: Lorretta Harp, MD;  Location: [place] CV LAB;  Service: Cardiovascular;  Laterality: N/A;  . PERIPHERAL VASCULAR CATHETERIZATION N/A 01/31/2016   Procedure: Abdominal Aortogram;  Surgeon: Lorretta Harp, MD;  Location: [place] CV LAB;  Service: Cardiovascular;  Laterality: N/A;  . PERIPHERAL VASCULAR CATHETERIZATION  01/31/2016   Procedure: Peripheral Vascular Intervention;  Surgeon: Lorretta Harp, MD;  Location: [place] CV LAB;  Service: Cardiovascular;;  . REDUCTION MAMMAPLASTY Bilateral 1985  . renal arteriogram  12/2014   occluded previous Lt renal stent and patent rt renal artery.  [name] RENAL ARTERY STENT Left 2012  . TONSILLECTOMY    . TUBAL LIGATION    . VAGINAL HYSTERECTOMY  ~ 1995  . WRIST FRACTURE SURGERY Left 2012    Allergies  Allergen Reactions  . Fentanyl Anaphylaxis    Cardiac arrest and coded  . Fish Allergy Anaphylaxis and Rash  . Morphine And Related Anaphylaxis    Cardiac arrest and coded Per Dr. Conley Canal patient has taken percocet without issue  . Singulair [Montelukast] Palpitations and Other (See Comments)    Increased BP and HR  . Actonel [Risedronate Sodium] Rash and Other (See Comments)    weakness  . Dilaudid [Hydromorphone Hcl] Rash and Other (See Comments)    Crying and screaming  . Fosamax [Alendronate Sodium] Rash and Other (See Comments)    Weakness and myalgias  . Hydralazine Diarrhea and Other (See Comments)    Swelling  in stomach  . Lipitor [Atorvastatin] Swelling and Rash    Tongue swelling   . Lisinopril Diarrhea, Rash and Cough  . Micardis [Telmisartan] Diarrhea and Rash  . Septra [Sulfamethoxazole-Trimethoprim] Nausea And Vomiting and Rash  . Tekturna [Aliskiren] Swelling and Rash    Swelling of tongue  . Tricor [Fenofibrate] Rash and Other (See Comments)    Flu symptoms  . Imdur [Isosorbide Dinitrate] Other (See Comments)    Headaches and blindness  . Amlodipine Diarrhea  . Avapro [Irbesartan] Rash  . Cardio Complete [Nutritional Supplements] Rash  . Cardizem [Diltiazem Hcl] Rash  . Ciprofloxacin Rash and Other (See Comments)    Black spot on body  . Codeine Rash  . Cyclobenzaprine Nausea Only  . Forteo [Parathyroid Hormone (Recomb)] Rash  . Inapsine [Droperidol] Rash  . Ivp Dye [Iodinated Diagnostic Agents]  Rash  . Shellfish Allergy Rash  . Tussionex Pennkinetic Er [Hydrocod Polst-Cpm Polst Er] Itching and Rash    Outpatient Encounter Medications as of 04/13/2019  Medication Sig  . acetaminophen (TYLENOL) 500 MG tablet Take 1,000 mg by mouth every 8 (eight) hours as needed for mild pain (for pain.).   [name] ALPRAZolam (XANAX) 0.25 MG tablet Take 0.125 mg by mouth at bedtime as needed for anxiety or sleep.  [name] aspirin 81 MG tablet Take 1 tablet (81 mg total) by mouth daily.  [name] azelastine (ASTELIN) 0.1 % nasal spray Place 1-2 sprays into both nostrils daily as needed. For allergies/congestion.  . betamethasone dipropionate (DIPROLENE) 0.05 % cream Apply 1 application topically daily as needed (Skin care).   . cholecalciferol (VITAMIN D) 1000 UNITS tablet Take 2,000 Units by mouth daily.   . cloNIDine (CATAPRES) 0.1 MG tablet TAKE 1 TABLET BY MOUTH EVERY MORNING AND 2 TABLETS IN THE EVENING (Patient taking differently: Take 0.1-0.2 mg by mouth See admin instructions. Take 1 tablet bu mouth every morning and 2 tablets by mouth in the evening.)  . clopidogrel (PLAVIX) 75 MG tablet Take 1 tablet (75  mg total) by mouth daily. Please make overdue appt for future refills. [phone]. 2nd attempt. (Patient taking differently: Take 75 mg by mouth daily. )  . denosumab (PROLIA) 60 MG/ML SOLN injection Inject 60 mg into the skin every 6 (six) months. Administer in upper arm, thigh, or abdomen  . docusate sodium (COLACE) 100 MG capsule Take 200 mg by mouth at bedtime as needed for mild constipation.  . fexofenadine (ALLEGRA) 180 MG tablet Take 180 mg by mouth daily.  . furosemide (LASIX) 20 MG tablet Take 1 tablet (20 mg total) by mouth 3 (three) times a week. Mon, Wed, and Fri only.  [name] HYDROcodone-acetaminophen (NORCO/VICODIN) 5-325 MG tablet Take 1 tablet by mouth every 6 (six) hours as needed.  . metFORMIN (GLUCOPHAGE-XR) 500 MG 24 hr tablet Take 500 mg by mouth daily with supper.   . methimazole (TAPAZOLE) 5 MG tablet Take 5 mg by mouth daily.  . nitroGLYCERIN (NITROSTAT) 0.4 MG SL tablet Place 0.4 mg under the tongue every 5 (five) minutes as needed for chest pain (3 doses MAX).  Glory Rosebush Delica Lancets [id number] MISC USE AS DIRECTED TO CHECK BLOOD SUGAR DAILY AND AS NEEDED E11.9  . ONETOUCH ULTRA test strip USE AS DIRECTED TO CHECK BLOOD SUGARS DAILY AS NEEDED  . pantoprazole (PROTONIX) 40 MG tablet TAKE 1 TABLET BY MOUTH TWICE A DAY BEFORE A MEAL (Patient taking differently: Take 40 mg by mouth 2 (two) times daily. )  . Polyethyl Glycol-Propyl Glycol (SYSTANE FREE OP) Place 2 drops into both eyes at bedtime.  [name] telmisartan (MICARDIS) 20 MG tablet Take 0.5 tablets (10 mg total) by mouth daily.   No facility-administered encounter medications on file as of 04/13/2019.    Review of Systems:  Review of Systems  Constitutional: Positive for activity change.  HENT: Negative.   Respiratory: Negative.   Cardiovascular: Negative.   Gastrointestinal: Positive for constipation.  Genitourinary: Negative.   Musculoskeletal: Positive for gait problem.  Neurological: Positive for dizziness and  weakness.  Psychiatric/Behavioral: Negative.     Health Maintenance  Topic Date Due  . FOOT EXAM  02/13/1942  . OPHTHALMOLOGY EXAM  02/13/1942  . PNA vac Low Risk Adult (2 of 2 - PCV13) 12/14/2015  . HEMOGLOBIN A1C  08/16/2019  . TETANUS/TDAP  04/05/2029  . INFLUENZA VACCINE  Completed  . DEXA  SCAN  Completed    Physical Exam: Vitals:   04/13/19 1509  BP: (!) 102/56  Pulse: 78  Temp: 97.6 F (36.4 C)  SpO2: 99%  Weight: 100 lb 9.6 oz (45.6 kg)  Height: 5' (1.524 m)   Body mass index is 19.65 kg/m. Physical Exam Vitals reviewed.  Constitutional:      Appearance: Normal appearance.  HENT:     Head: Normocephalic.     Comments: Well healed 2 Wounds    Nose: Nose normal.     Mouth/Throat:     Mouth: Mucous membranes are moist.     Pharynx: Oropharynx is clear.  Eyes:     Pupils: Pupils are equal, round, and reactive to light.  Cardiovascular:     Rate and Rhythm: Normal rate.     Pulses: Normal pulses.     Heart sounds: Murmur present.  Pulmonary:     Effort: Pulmonary effort is normal. No respiratory distress.     Breath sounds: Normal breath sounds. No wheezing or rales.  Abdominal:     General: Abdomen is flat. Bowel sounds are normal.     Palpations: Abdomen is soft.  Musculoskeletal:        General: No swelling.     Cervical back: Neck supple.  Skin:    General: Skin is warm.  Neurological:     General: No focal deficit present.     Mental Status: She is alert and oriented to person, place, and time.     Comments: Was able to walk with walker. No Dizziness  Psychiatric:        Mood and Affect: Mood normal.        Thought Content: Thought content normal.     Labs reviewed: Basic Metabolic Panel: Recent Labs    02/15/19 1945 02/16/19 0500 02/25/19 1339 03/08/19 1422 03/25/19 1554  NA  --  128* 137 136 130*  K  --  3.4* 4.7 5.3* 4.5  CL  --  92* 101 100 91*  CO2  --  23 22 25 26   GLUCOSE  --  162* 112* 95 216*  BUN  --  13 23 20 20     CREATININE  --  1.05* 1.19* 1.17* 1.14*  CALCIUM  --  9.0 9.5 9.7 9.3  MG  --  1.5*  --   --   --   PHOS  --  4.1  --   --   --   TSH 2.464  --   --   --   --    Liver Function Tests: Recent Labs    02/15/19 1313 02/16/19 0500  AST 23 19  ALT 17 15  ALKPHOS 68 67  BILITOT 0.9 0.7  PROT 6.5 5.7*  ALBUMIN 3.6 3.1*   No results for input(s): LIPASE, AMYLASE in the last 8760 hours. No results for input(s): AMMONIA in the last 8760 hours. CBC: Recent Labs    09/21/18 1711 02/15/19 1313 02/16/19 0500 02/21/19 1439 02/25/19 1339  WBC 8.3 5.9 8.1 8.4 8.0  NEUTROABS 6.3 4.7  --   --  6.0  HGB 10.4* 9.8* 9.4* 8.8* 9.2*  HCT 33.4* 31.2* 29.7* 28.6* 29.8*  MCV 85.2 74.6* 75.0* 77.1* 75*  PLT 389 472* 428* 427* 503*   Lipid Panel: No results for input(s): CHOL, HDL, LDLCALC, TRIG, CHOLHDL, LDLDIRECT in the last 8760 hours. Lab Results  Component Value Date   HGBA1C 6.8 (H) 02/16/2019    Procedures since last visit: DG Pelvis  1-2 Views  Result Date: 04/08/2019 CLINICAL DATA:  Multiple falls, denies hip pain EXAM: PELVIS - 1-2 VIEW COMPARISON:  Radiograph 02/06/2015, CT abdomen pelvis 02/15/2019 FINDINGS: Bones of the pelvis are intact and congruent. Femoral heads are normally located. Prior right femoral intramedullary with transcervical fixation is unchanged from comparison exams. No evidence of acute hardware complication. Radiodense coils projecting over the sacrum have an appearance most compatible with mesh anchors. Vascular calcium noted in the pelvis. Bowel gas pattern is normal. Degenerative changes are present in the lower lumbar spine with marked levocurvature similar to prior, SI joints and both hips. Heterotopic calcification adjacent the right greater trochanter is similar to prior. IMPRESSION: 1. No acute fracture or traumatic malalignment. 2. Diffuse osteopenia. 3. Stable postoperative appearance of the right hip without evidence of hardware complication. 4. Stable  heterotopic ossification adjacent to the right greater trochanter. Electronically Signed   By: Lovena Le M.D.   On: 04/08/2019 00:45   CT Head Wo Contrast  Result Date: 04/08/2019 CLINICAL DATA:  Multiple falls, fell yesterday with scalp staples EXAM: CT HEAD WITHOUT CONTRAST CT CERVICAL SPINE WITHOUT CONTRAST TECHNIQUE: Multidetector CT imaging of the head and cervical spine was performed following the standard protocol without intravenous contrast. Multiplanar CT image reconstructions of the cervical spine were also generated. COMPARISON:  CT head 04/06/2019, CT cervical spine 02/15/2019 FINDINGS: CT HEAD FINDINGS Brain: Remote bilateral basal ganglia lacune is. No evidence of acute infarction, hemorrhage, hydrocephalus, extra-axial collection or mass lesion/mass effect. Symmetric prominence of the ventricles, cisterns and sulci compatible with parenchymal volume loss. Confluent areas of white matter hypoattenuation are most compatible with chronic microvascular angiopathy. Vascular: Atherosclerotic calcification of the carotid siphons and intradural vertebral arteries. No hyperdense vessel. Skull: Posterior scalp swelling and skin staples with overlying bandaging material. Some right parieto-occipital swelling is new from comparison 04/06/2019 with trace lentiform scalp hematoma measuring up to 3 mm in thickness. No subjacent calvarial fracture. No suspicious osseous lesions. Sinuses/Orbits: Paranasal sinuses and mastoid air cells are predominantly clear. Orbital structures are unremarkable aside from prior lens extractions. Remote nasal bone fractures similar to prior. Other: Bilateral TMJ arthrosis. CT CERVICAL SPINE FINDINGS Alignment: Chronic anterolisthesis of C2 in 3 of approximately 2.5 mm, similar to comparison study. Additional minimal anterolisthesis of C7 on T1, also stable from prior. Bony fusion of the C3-C5 levels. Anterior plate and screw fixation of the C5-C7 levels with bony fusion across  C6 and C7. No abnormally widened facets. Bony fusion of the bilateral C3-C5 and C6-C7 facets. Skull base and vertebrae: Degenerative mineralization is adjacent the dens are similar to prior with calcific pannus formation. No acute fracture or vertebral body height loss. No suspicious osseous lesions. The osseous structures appear diffusely demineralized which may limit detection of small or nondisplaced fractures. Soft tissues and spinal canal: No pre or paravertebral fluid or swelling. No visible canal hematoma. Disc levels: Multilevel fusions of the cervical spine as above. There is mild bony canal stenosis from C3-C5. Uncinate spurring and facet hypertrophic changes at the unfused levels result in mild to moderate multilevel neural foraminal narrowing. Additional pronounced spondylitic endplate changes at [id number]. Upper chest: Extensive calcification of the proximal great vessels. Biapical pleuroparenchymal scarring and some bronchiectatic change in the left apex are similar to prior. Other: Heterogeneously enlarged and multinodular thyroid gland. There is a 2.7 cm hypoattenuating right thyroid lobe nodule as well as a 2.1 cm isthmic nodule similar to the comparison study. IMPRESSION: 1. Increasing posterior scalp swelling with new  trace right parietal scalp hematoma without subjacent calvarial fracture. 2. No acute intracranial abnormality. 3. Chronic microvascular angiopathy and parenchymal volume loss, similar to priors. Stable bilateral lacunar infarcts in the basal ganglia. 4. No acute cervical spine fracture. 5. Multilevel cervical fusion. Bony fusion C3-C5 and C6-C7 with instrumented anterior fixation C5-C7. 6. Mild canal stenosis C3-C5 levels. Multilevel mild neural foraminal narrowing. 7. Heterogeneously enlarged multinodular thyroid gland. Further assessment by dedicated thyroid ultrasound is recommended unless contraindicated by age and/or comorbidities following clinical discussion. This follows  consensus guidelines: Managing Incidental Thyroid Nodules Detected on Imaging: White Paper of the ACR Incidental Thyroid Findings Committee. J Am Coll Radiol 2015; 12:143-150. and [facility] 3-tiered system for managing ITNs: J Am Coll Radiol. 2015; Feb;12(2): 143-50 Electronically Signed   By: Lovena Le M.D.   On: 04/08/2019 00:57   CT Head Wo Contrast  Result Date: 04/06/2019 CLINICAL DATA:  Fall, head trauma EXAM: CT HEAD WITHOUT CONTRAST TECHNIQUE: Contiguous axial images were obtained from the base of the skull through the vertex without intravenous contrast. COMPARISON:  02/15/2019 FINDINGS: Brain: There is no acute intracranial hemorrhage, mass-effect, or edema. Gray-white differentiation is preserved. There is no extra-axial fluid collection. Mild prominence of the ventricles and sulci reflects stable parenchymal volume loss. Patchy and confluent hypoattenuation in the supratentorial white matter likely reflects stable chronic microvascular ischemic changes. Vascular: There is atherosclerotic calcification at the skull base. Skull: Calvarium is unremarkable. Sinuses/Orbits: No acute finding. Other: Posterior scalp soft tissue swelling with overlying gauze. IMPRESSION: No evidence of acute intracranial injury. Stable chronic findings detailed above. Electronically Signed   By: Macy Mis M.D.   On: 04/06/2019 15:32   CT Cervical Spine Wo Contrast  Result Date: 04/08/2019 CLINICAL DATA:  Multiple falls, fell yesterday with scalp staples EXAM: CT HEAD WITHOUT CONTRAST CT CERVICAL SPINE WITHOUT CONTRAST TECHNIQUE: Multidetector CT imaging of the head and cervical spine was performed following the standard protocol without intravenous contrast. Multiplanar CT image reconstructions of the cervical spine were also generated. COMPARISON:  CT head 04/06/2019, CT cervical spine 02/15/2019 FINDINGS: CT HEAD FINDINGS Brain: Remote bilateral basal ganglia lacune is. No evidence of acute infarction, hemorrhage,  hydrocephalus, extra-axial collection or mass lesion/mass effect. Symmetric prominence of the ventricles, cisterns and sulci compatible with parenchymal volume loss. Confluent areas of white matter hypoattenuation are most compatible with chronic microvascular angiopathy. Vascular: Atherosclerotic calcification of the carotid siphons and intradural vertebral arteries. No hyperdense vessel. Skull: Posterior scalp swelling and skin staples with overlying bandaging material. Some right parieto-occipital swelling is new from comparison 04/06/2019 with trace lentiform scalp hematoma measuring up to 3 mm in thickness. No subjacent calvarial fracture. No suspicious osseous lesions. Sinuses/Orbits: Paranasal sinuses and mastoid air cells are predominantly clear. Orbital structures are unremarkable aside from prior lens extractions. Remote nasal bone fractures similar to prior. Other: Bilateral TMJ arthrosis. CT CERVICAL SPINE FINDINGS Alignment: Chronic anterolisthesis of C2 in 3 of approximately 2.5 mm, similar to comparison study. Additional minimal anterolisthesis of C7 on T1, also stable from prior. Bony fusion of the C3-C5 levels. Anterior plate and screw fixation of the C5-C7 levels with bony fusion across C6 and C7. No abnormally widened facets. Bony fusion of the bilateral C3-C5 and C6-C7 facets. Skull base and vertebrae: Degenerative mineralization is adjacent the dens are similar to prior with calcific pannus formation. No acute fracture or vertebral body height loss. No suspicious osseous lesions. The osseous structures appear diffusely demineralized which may limit detection of small or nondisplaced fractures.  Soft tissues and spinal canal: No pre or paravertebral fluid or swelling. No visible canal hematoma. Disc levels: Multilevel fusions of the cervical spine as above. There is mild bony canal stenosis from C3-C5. Uncinate spurring and facet hypertrophic changes at the unfused levels result in mild to  moderate multilevel neural foraminal narrowing. Additional pronounced spondylitic endplate changes at QA348G. Upper chest: Extensive calcification of the proximal great vessels. Biapical pleuroparenchymal scarring and some bronchiectatic change in the left apex are similar to prior. Other: Heterogeneously enlarged and multinodular thyroid gland. There is a 2.7 cm hypoattenuating right thyroid lobe nodule as well as a 2.1 cm isthmic nodule similar to the comparison study. IMPRESSION: 1. Increasing posterior scalp swelling with new trace right parietal scalp hematoma without subjacent calvarial fracture. 2. No acute intracranial abnormality. 3. Chronic microvascular angiopathy and parenchymal volume loss, similar to priors. Stable bilateral lacunar infarcts in the basal ganglia. 4. No acute cervical spine fracture. 5. Multilevel cervical fusion. Bony fusion C3-C5 and C6-C7 with instrumented anterior fixation C5-C7. 6. Mild canal stenosis C3-C5 levels. Multilevel mild neural foraminal narrowing. 7. Heterogeneously enlarged multinodular thyroid gland. Further assessment by dedicated thyroid ultrasound is recommended unless contraindicated by age and/or comorbidities following clinical discussion. This follows consensus guidelines: Managing Incidental Thyroid Nodules Detected on Imaging: White Paper of the ACR Incidental Thyroid Findings Committee. J Am Coll Radiol 2015; 12:143-150. and [facility] 3-tiered system for managing ITNs: J Am Coll Radiol. 2015; Feb;12(2): 143-50 Electronically Signed   By: Lovena Le M.D.   On: 04/08/2019 00:57    Assessment/Plan Laceration of scalp, subsequent encounter Staples Removed from one Wound.  Husband wanted to wait before getting the other one out. Will be done in next Visit  Fall, subsequent encounter Patient says she is doing better now that she is using Walker Has seen therapy before. Will need more eval if any other falls  Hyperthyroidism On Tapazole Followed by her  PCP Will need Repeat TSH and T$ CAD S/p MI in 2007 On Aspirin and Plavix ? No statin  Essential hypertension On Number of Meds I have told husband to check her BP when she c/o Dizziness on standing up And make sure it is not related to low BP Recently added Micardis and Lasix per Cardiology  Benign paroxysmal positional vertigo,  Does have h/o  Today in the Clinic She denied any dizziness Plan to see Dr Jannifer Franklin for this  Anemia Looks Iron Def No GI work up that I see Have told her to start Iron supplements 3/week for few weeks to see if it helps her  Diabetes On Metformin A1C was less then 7 Osteoporosis ON Prolia and Vit D Labs/tests ordered:  * No order type specified * Next appt:  04/29/2019  Total time spent in this patient care encounter was  45_  minutes; greater than 50% of the visit spent counseling patient and staff, reviewing records , Labs and coordinating care for problems addressed at this encounter.

## 2019-04-17 DIAGNOSIS — W19XXXA Unspecified fall, initial encounter: Secondary | ICD-10-CM | POA: Insufficient documentation

## 2019-04-20 ENCOUNTER — Non-Acute Institutional Stay (SKILLED_NURSING_FACILITY): Payer: Self-pay | Admitting: Internal Medicine

## 2019-04-20 ENCOUNTER — Other Ambulatory Visit: Payer: Self-pay

## 2019-04-20 VITALS — HR 85 | Temp 97.7°F

## 2019-04-20 DIAGNOSIS — Z4802 Encounter for removal of sutures: Secondary | ICD-10-CM

## 2019-04-21 ENCOUNTER — Telehealth: Payer: Self-pay | Admitting: Internal Medicine

## 2019-04-21 ENCOUNTER — Telehealth: Payer: Self-pay

## 2019-04-21 NOTE — Telephone Encounter (Signed)
Patients husband called with concerns of his wife's behavior last night he said they had just gotten the Covid Vaccine and that she was not acting like herself her speech was slurred and she was confused and didn't know his name and wanted to keep getting up to get dressed and he wanted to see what Dr. Lyndel Safe had to say about this I called Dr. Lyndel Safe and she said she would call and speak with him herself

## 2019-04-21 NOTE — Telephone Encounter (Signed)
I talked to the Husband. Patient was sleeping. Per husband she got the Covid vaccine on Mon. Yesterday she got up at night and was confused and restless. And now is sleeping I told him that I don't think it is due to Vaccine. If she gets up and still confused then she needs to go to ED. But if she is just tired he can monitor her. He understood.

## 2019-04-22 ENCOUNTER — Other Ambulatory Visit: Payer: Self-pay

## 2019-04-22 ENCOUNTER — Telehealth (INDEPENDENT_AMBULATORY_CARE_PROVIDER_SITE_OTHER): Payer: PPO | Admitting: Physician Assistant

## 2019-04-22 VITALS — BP 176/73 | HR 86 | Ht 61.0 in | Wt 100.0 lb

## 2019-04-22 DIAGNOSIS — I5032 Chronic diastolic (congestive) heart failure: Secondary | ICD-10-CM

## 2019-04-22 DIAGNOSIS — I2510 Atherosclerotic heart disease of native coronary artery without angina pectoris: Secondary | ICD-10-CM

## 2019-04-22 DIAGNOSIS — I110 Hypertensive heart disease with heart failure: Secondary | ICD-10-CM

## 2019-04-22 DIAGNOSIS — I350 Nonrheumatic aortic (valve) stenosis: Secondary | ICD-10-CM

## 2019-04-22 DIAGNOSIS — I10 Essential (primary) hypertension: Secondary | ICD-10-CM

## 2019-04-22 MED ORDER — CLONIDINE HCL 0.1 MG PO TABS: ORAL_TABLET | ORAL | 3 refills | Status: DC

## 2019-04-22 MED ORDER — CLONIDINE HCL 0.2 MG PO TABS: 0.2000 mg | ORAL_TABLET | Freq: Two times a day (BID) | ORAL | 2 refills | Status: DC

## 2019-04-22 NOTE — Patient Instructions (Addendum)
Medication Instructions:  Your physician has recommended you make the following change in your medication:  Increase Clonidine to 0.2 mg in the morning, 0.1 mg at 4 PM and 0.2 mg at 10 PM daily    *If you need a refill on your cardiac medications before your next appointment, please call your pharmacy*  Lab Work:  none If you have labs (blood work) drawn today and your tests are completely normal, you will receive your results only by: [name] MyChart Message (if you have MyChart) OR . A paper copy in the mail If you have any lab test that is abnormal or we need to change your treatment, we will call you to review the results.  Testing/Procedures:  none  Follow-Up: At [facility], you and your health needs are our priority.  As part of our continuing mission to provide you with exceptional heart care, we have created designated Provider Care Teams.  These Care Teams include your primary Cardiologist (physician) and Advanced Practice Providers (APPs -  Physician Assistants and Nurse Practitioners) who all work together to provide you with the care you need, when you need it.  Your next appointment:   February 19,2021 at 3:00  The format for your next appointment:   In Person  Provider:   Dr Tamala Julian  Other Instructions

## 2019-04-22 NOTE — Progress Notes (Signed)
Virtual Visit via Telephone Note   This visit type was conducted due to national recommendations for restrictions regarding the COVID-19 Pandemic (e.g. social distancing) in an effort to limit this patient's exposure and mitigate transmission in our community.  Due to her co-morbid illnesses, this patient is at least at moderate risk for complications without adequate follow up.  This format is felt to be most appropriate for this patient at this time.  The patient did not have access to video technology/had technical difficulties with video requiring transitioning to audio format only (telephone).  All issues noted in this document were discussed and addressed.  No physical exam could be performed with this format.  Please refer to the patient's chart for her  consent to telehealth for [facility].   Date:  04/22/2019   ID:  [name], DOB Jun 13, 1931, MRN [id number]  Patient Location: Home Provider Location: Home  PCP:  Lavone Orn, MD  Cardiologist:  Sinclair Grooms, MD   Electrophysiologist:  None   Evaluation Performed:  Follow-Up Visit  Chief Complaint:  CHF, HTN  History of Present Illness:    [name] is a 86 y.o. female with    Coronary artery disease ? S/p MI (RV infarct) 2007, c/b high grade AV block - temp pacer; RCA occl >> PCI not successful ? Known CTO of RCA (cath 07/2006)  Renal artery stenosis ? S/p prior L RA stenting ? S/p repeat L RA stenting in 01/2016 (ISR)  Carotid artery dz  Hx of CVA  Mod aortic stenosis   Mild mitral stenosis  Hypertension   Hyperlipidemia   Diabetes mellitus   Chronic kidney disease 3   Hx of syncope  Meniere's Dz  She was last seen in 02/2019 after a trip to the ED following a fall and a subsequent ED visit for chest pain.  She had a low sodium, low hemoglobin, elevated BNP and hs-Trop was mildly elevated without significant delta.  Her chest pain was likely MSK and no further testing was recommended.  A  repeat BNP was still elevated and I placed her on Furosemide.  She has been to the ED twice in late Dec due to mechanical falls resulting in a head laceration.  Head CT was without acute hemorrhage.  Most recently, she had a COVID-19 vaccine and called her PCP with symptoms of confusion.    Today, her husband, [name], helps with the history.  [name] has not had chest pain, shortness of breath, orthopnea, paroxysmal nocturnal dyspnea, leg swelling or syncope.     Past Medical History:  Diagnosis Date  . BPPV (benign paroxysmal positional vertigo) 01/08/2016  . Bradycardia    a. Coreg decreased due to HR low 40s on event monitor 2017 -> further decreased due to HR upper 30s in 01/2016.  . Breast nodule 02/15/2019  . CAD (coronary artery disease)    a. RV infarct 2007 c/b high grade AV block with total occlusion of RCA, unable to treat with PCI. b. Nuc 10/2015: scar but no ischemia.  . Carotid artery disease ([place])   . Chronic anemia   . Chronic lower back pain   . CKD (chronic kidney disease), stage III   . Complication of anesthesia    "almost died when I had my breast reduction"  . Dyslipidemia   . GERD (gastroesophageal reflux disease)   . Heart murmur   . High cholesterol   . History of hiatal hernia   . HTN (  hypertension)   . Hyperthyroidism   . Memory problem 02/15/2019  . Meniere disease    "for years" takes Antivert 3 times per day (01/31/2016)  . Myocardial infarction [facility]) 2005-06-13   "almost died"  . Near syncope   . Osteoporosis   . Presbycusis of both ears 01/29/2016  . Renal artery stenosis ([facility])    a. left renal artery stent June 13, 2010. b. s/p repeat left renal stenting 01/2016.  . Stroke [facility])    a. Prior infarcts seen on head CT.  . TMJ pain dysfunction syndrome 08/26/2016  . Type II diabetes mellitus ([place])    "controlled w/diet and exercise only" (01/31/2016)  . Weakness 02/26/2015   Past Surgical History:  Procedure Laterality Date  . ANTERIOR CERVICAL  DECOMP/DISCECTOMY FUSION  02/2002   Archie Endo 08/26/2010  . APPENDECTOMY    . BACK SURGERY    . BLADDER SUSPENSION  x3  . BREAST LUMPECTOMY  11/2007   breast needle-localized lumpectomy/notes 08/12/2012  . CARDIAC CATHETERIZATION  06-13-2005   "couldn't get stents in; I almost died'  . CATARACT EXTRACTION W/ INTRAOCULAR LENS  IMPLANT, BILATERAL Bilateral   . CATARACT EXTRACTION W/ INTRAOCULAR LENS  IMPLANT, BILATERAL Bilateral   . CHOLECYSTECTOMY OPEN    . FRACTURE SURGERY    . HIP FRACTURE SURGERY Right 06-13-2009   "added rod in my leg and a ball"  . KYPHOPLASTY N/A 03/20/2015   Procedure: THORACIC TWELVE KYPHOPLASTY;  Surgeon: Jovita Gamma, MD;  Location: [place] NEURO ORS;  Service: Neurosurgery;  Laterality: N/A;  . KYPHOPLASTY N/A 04/23/2015   Procedure: KYPHOPLASTY - LUMBAR ONE;  Surgeon: Jovita Gamma, MD;  Location: [place] NEURO ORS;  Service: Neurosurgery;  Laterality: N/A;  L1 Kyphoplasty  . NECK HARDWARE REMOVAL  11/2002   "replaced screw"  . PERIPHERAL VASCULAR CATHETERIZATION Left 12/20/2014   Procedure: Renal Angiography;  Surgeon: Wellington Hampshire, MD;  Location: [place] CV LAB;  Service: Cardiovascular;  Laterality: Left;  . PERIPHERAL VASCULAR CATHETERIZATION N/A 01/31/2016   Procedure: Renal Angiography;  Surgeon: Lorretta Harp, MD;  Location: [place] CV LAB;  Service: Cardiovascular;  Laterality: N/A;  . PERIPHERAL VASCULAR CATHETERIZATION N/A 01/31/2016   Procedure: Abdominal Aortogram;  Surgeon: Lorretta Harp, MD;  Location: [place] CV LAB;  Service: Cardiovascular;  Laterality: N/A;  . PERIPHERAL VASCULAR CATHETERIZATION  01/31/2016   Procedure: Peripheral Vascular Intervention;  Surgeon: Lorretta Harp, MD;  Location: [place] CV LAB;  Service: Cardiovascular;;  . REDUCTION MAMMAPLASTY Bilateral 1985  . renal arteriogram  12/2014   occluded previous Lt renal stent and patent rt renal artery.  [name] RENAL ARTERY STENT Left 2010-06-13  . TONSILLECTOMY    . TUBAL LIGATION    .  VAGINAL HYSTERECTOMY  ~ 1995  . WRIST FRACTURE SURGERY Left 06-13-2010     Current Meds  Medication Sig  . acetaminophen (TYLENOL) 500 MG tablet Take 1,000 mg by mouth every 8 (eight) hours as needed for mild pain (for pain.).   [name] ALPRAZolam (XANAX) 0.25 MG tablet Take 0.125 mg by mouth at bedtime as needed for anxiety or sleep.  [name] aspirin 81 MG tablet Take 1 tablet (81 mg total) by mouth daily.  [name] azelastine (ASTELIN) 0.1 % nasal spray Place 1-2 sprays into both nostrils daily as needed. For allergies/congestion.  . betamethasone dipropionate (DIPROLENE) 0.05 % cream Apply 1 application topically daily as needed (Skin care).   . cholecalciferol (VITAMIN D) 1000 UNITS tablet Take 2,000 Units by mouth daily.   [name]  clopidogrel (PLAVIX) 75 MG tablet Take 1 tablet (75 mg total) by mouth daily. Please make overdue appt for future refills. [phone]. 2nd attempt. (Patient taking differently: Take 75 mg by mouth daily. )  . denosumab (PROLIA) 60 MG/ML SOLN injection Inject 60 mg into the skin every 6 (six) months. Administer in upper arm, thigh, or abdomen  . docusate sodium (COLACE) 100 MG capsule Take 200 mg by mouth at bedtime as needed for mild constipation.  . fexofenadine (ALLEGRA) 180 MG tablet Take 180 mg by mouth daily.  . furosemide (LASIX) 20 MG tablet Take 1 tablet (20 mg total) by mouth 3 (three) times a week. Mon, Wed, and Fri only.  . metFORMIN (GLUCOPHAGE-XR) 500 MG 24 hr tablet Take 500 mg by mouth daily with supper.   . methimazole (TAPAZOLE) 5 MG tablet Take 5 mg by mouth daily.  . nitroGLYCERIN (NITROSTAT) 0.4 MG SL tablet Place 0.4 mg under the tongue every 5 (five) minutes as needed for chest pain (3 doses MAX).  Glory Rosebush Delica Lancets 71I MISC USE AS DIRECTED TO CHECK BLOOD SUGAR DAILY AND AS NEEDED E11.9  . ONETOUCH ULTRA test strip USE AS DIRECTED TO CHECK BLOOD SUGARS DAILY AS NEEDED  . pantoprazole (PROTONIX) 40 MG tablet TAKE 1 TABLET BY MOUTH TWICE A DAY BEFORE A MEAL  (Patient taking differently: Take 40 mg by mouth 2 (two) times daily. )  . Polyethyl Glycol-Propyl Glycol (SYSTANE FREE OP) Place 2 drops into both eyes at bedtime.  . simvastatin (ZOCOR) 20 MG tablet Take 20 mg by mouth as directed. Take one tablet every Monday, Wednesday, Friday  . telmisartan (MICARDIS) 40 MG tablet Take 20 mg by mouth 2 (two) times daily.  . [DISCONTINUED] cloNIDine (CATAPRES) 0.1 MG tablet TAKE 1 TABLET BY MOUTH EVERY MORNING AND 2 TABLETS IN THE EVENING (Patient taking differently: Take 0.1-0.2 mg by mouth See admin instructions. Take 1 tablet bu mouth every morning and 2 tablets by mouth in the evening.)  . [DISCONTINUED] telmisartan (MICARDIS) 20 MG tablet Take 0.5 tablets (10 mg total) by mouth daily.     Allergies:   Fentanyl, Fish allergy, Morphine and related, Singulair [montelukast], Actonel [risedronate sodium], Dilaudid [hydromorphone hcl], Fosamax [alendronate sodium], Hydralazine, Lipitor [atorvastatin], Lisinopril, Micardis [telmisartan], Septra [sulfamethoxazole-trimethoprim], Tekturna [aliskiren], Tricor [fenofibrate], Imdur [isosorbide dinitrate], Amlodipine, Avapro [irbesartan], Cardio complete [nutritional supplements], Cardizem [diltiazem hcl], Ciprofloxacin, Codeine, Cyclobenzaprine, Forteo [parathyroid hormone (recomb)], Inapsine [droperidol], Ivp dye [iodinated diagnostic agents], Shellfish allergy, and Tussionex pennkinetic er [hydrocod polst-cpm polst er]   Social History   Tobacco Use  . Smoking status: Former Smoker    Packs/day: 1.00    Years: 30.00    Pack years: 30.00    Types: Cigarettes  . Smokeless tobacco: Never Used  . Tobacco comment: "quit smoking in 1989"  Substance Use Topics  . Alcohol use: No    Alcohol/week: 0.0 standard drinks  . Drug use: No     Family Hx: The patient's family history includes Alzheimer's disease in her sister; Congestive Heart Failure in her father; Dementia in her brother; Diabetes type II in her brother,  mother, sister, and sister; Healthy in her sister and sister; Heart attack in her mother and sister; Heart disease in her brother, mother, and sister; Kidney failure in her sister and sister; Other in her brother; Pulmonary disease in her sister; Pulmonary embolism in her father; Stroke in her brother and brother.  ROS:   Please see the history of present illness.  All other systems reviewed and are negative.   Prior CV studies:   The following studies were reviewed today:   Echocardiogram 02/16/2019 EF 55-60, mild septal LVH, mild post LVH, Gr 1 DD, mild LAE, mild MAC, mild MR, mild MS (mean 4), severe AoV calc, mod AI, mod AS (mean 20)  Renal Art [place] 10/06/2018 Summary: Largest Aortic Diameter: 2.2 cm Renal: Right: Cyst(s) noted. RRV flow present. No evidence of right renal artery stenosis.  Left: Normal size of left kidney. Cyst(s) noted. LRV flow present. Normal cortical thickness of the left kidney. The left renal stent was not visualized today due to overlying bowel and patient discomfort. Decreased left kidney size compared to previous, although images are suboptimal. Mesenteric: Normal Superior Mesenteric artery findings. 70 to 99% stenosis in the celiac artery. Areas of limited visceral study include left renal artery.  Carotid US 10/06/2018 Bilateral ICA 1-39; left VA retrograde flow; left subclavian stenosis with left subclavian steal >> repeat 1 year  Myoview 10/24/2015  Nuclear stress EF: 53%.  There was no ST segment deviation noted during stress.  Defect 1: There is a small defect of severe severity present in the basal inferior and mid inferior location.  Findings consistent with prior myocardial infarction.  This is a low risk study.  The left ventricular ejection fraction is mildly decreased (45-54%). There is a small severe non-reversible defect in the basal and mid inferior walls consistent with prior  infarction and no evidence of ischemia.  Event monitor 10/01/15  Underlying rhythm is NSR with HR range 37-114 bpm. Ave HR 65 bpm.  Syncope episode not associated with arrhythmia Underlying basic rhythm is NSR with occasional bradycardia as low as 37 bpm. Syncope not associated with rhythm disturbance. Possible early Sick sinus given occasional bradycardia below 50 bpm.  Cardiac catheterization 07/30/2006 LM ok LAD mid 60 LCx irregs RCA 100 (L-R collats) L RA superior br 50-60 EF 60   Labs/Other Tests and Data Reviewed:    EKG:  No ECG reviewed.  Recent Labs: 02/15/2019: B Natriuretic Peptide 1,115.9; TSH 2.464 02/16/2019: ALT 15; Magnesium 1.5 02/25/2019: Hemoglobin 9.2; Platelets 503 03/25/2019: BUN 20; Creatinine, Ser 1.14; NT-Pro BNP 3,715; Potassium 4.5; Sodium 130   Recent Lipid Panel   Wt Readings from Last 3 Encounters:  04/22/19 100 lb (45.4 kg)  04/13/19 100 lb 9.6 oz (45.6 kg)  04/06/19 102 lb 9.6 oz (46.5 kg)     Objective:    Vital Signs:  BP (!) 176/73   Pulse 86   Ht _0  (1.549 m)   Wt 100 lb (45.4 kg)   BMI 18.89 kg/m    VITAL SIGNS:  reviewed GEN:  no acute distress RESPIRATORY:  Normal respiratory effort NEURO:  Alert and oriented PSYCH:  Normal mood  ASSESSMENT & PLAN:    1. Chronic diastolic CHF (congestive heart failure) ([place]) She had several recent visits to the emergency room.  She has had several BNP levels that are significantly elevated.  An echocardiogram in November 2020 demonstrated normal LV function and mild diastolic dysfunction.  She is largely asymptomatic.  She has been tolerating furosemide 3 times a week.  As she is clinically stable, I do not recommend adjusting her furosemide any further.  We discussed symptoms to look out for which could indicate worsening volume status.  2. Coronary artery disease involving native coronary artery of native heart without angina pectoris History of RV infarct in 2007.  She has a known  chronically occluded RCA.  She is doing well without angina.  Continue aspirin, Plavix, simvastatin.  3. Essential hypertension Blood pressure is uncontrolled.  She has multiple drug intolerances.  Continue current dose of telmisartan, furosemide.  Increase clonidine to 0.2 mg twice daily.  4. Aortic valve stenosis Moderate aortic stenosis by echocardiogram November 2020 with a mean gradient of 20.  She also has mild mitral stenosis and moderate aortic insufficiency.  Consider repeat echocardiogram in November 2021.  Time:   Today, I have spent 21 minutes with the patient with telehealth technology discussing the above problems.     Medication Adjustments/Labs and Tests Ordered: Current medicines are reviewed at length with the patient today.  Concerns regarding medicines are outlined above.   Tests Ordered: No orders of the defined types were placed in this encounter.   Medication Changes: Meds ordered this encounter  Medications  . DISCONTD: cloNIDine (CATAPRES) 0.2 MG tablet    Sig: Take 1 tablet (0.2 mg total) by mouth 2 (two) times daily.    Dispense:  180 tablet    Refill:  2    Dose increase  . cloNIDine (CATAPRES) 0.1 MG tablet    Sig: Take two tablets by mouth in the morning, one tablet at 4 PM and two tablets at 10 PM daily    Dispense:  150 tablet    Refill:  3    Please cancel previous prescription    Follow Up:  06/03/2019 with Dr. Tamala Julian   Signed, Richardson Dopp, PA-C  04/22/2019 12:37 PM    [place]

## 2019-04-26 DIAGNOSIS — H903 Sensorineural hearing loss, bilateral: Secondary | ICD-10-CM | POA: Diagnosis not present

## 2019-04-26 NOTE — Progress Notes (Signed)
  Patient was seen for staple removal. No charge.

## 2019-04-27 ENCOUNTER — Other Ambulatory Visit: Payer: Self-pay | Admitting: Physician Assistant

## 2019-04-27 ENCOUNTER — Other Ambulatory Visit: Payer: Self-pay | Admitting: Internal Medicine

## 2019-04-27 DIAGNOSIS — I10 Essential (primary) hypertension: Secondary | ICD-10-CM

## 2019-04-27 DIAGNOSIS — E871 Hypo-osmolality and hyponatremia: Secondary | ICD-10-CM

## 2019-04-27 DIAGNOSIS — E0590 Thyrotoxicosis, unspecified without thyrotoxic crisis or storm: Secondary | ICD-10-CM

## 2019-04-29 ENCOUNTER — Other Ambulatory Visit: Payer: PPO

## 2019-04-29 ENCOUNTER — Other Ambulatory Visit: Payer: Self-pay

## 2019-04-29 DIAGNOSIS — I10 Essential (primary) hypertension: Secondary | ICD-10-CM | POA: Diagnosis not present

## 2019-04-29 DIAGNOSIS — E871 Hypo-osmolality and hyponatremia: Secondary | ICD-10-CM | POA: Diagnosis not present

## 2019-04-29 DIAGNOSIS — E0590 Thyrotoxicosis, unspecified without thyrotoxic crisis or storm: Secondary | ICD-10-CM | POA: Diagnosis not present

## 2019-04-30 LAB — CBC WITH DIFFERENTIAL/PLATELET
Absolute Monocytes: 481 cells/uL (ref 200–950)
Basophils Absolute: 22 cells/uL (ref 0–200)
Basophils Relative: 0.3 %
Eosinophils Absolute: 37 cells/uL (ref 15–500)
Eosinophils Relative: 0.5 %
HCT: 23.1 % — ABNORMAL LOW (ref 35.0–45.0)
Hemoglobin: 7.1 g/dL — ABNORMAL LOW (ref 11.7–15.5)
Lymphs Abs: 1369 cells/uL (ref 850–3900)
MCH: 23.7 pg — ABNORMAL LOW (ref 27.0–33.0)
MCHC: 30.7 g/dL — ABNORMAL LOW (ref 32.0–36.0)
MCV: 77 fL — ABNORMAL LOW (ref 80.0–100.0)
MPV: 9.3 fL (ref 7.5–12.5)
Monocytes Relative: 6.5 %
Neutro Abs: 5491 cells/uL (ref 1500–7800)
Neutrophils Relative %: 74.2 %
Platelets: 530 10*3/uL — ABNORMAL HIGH (ref 140–400)
RBC: 3 10*6/uL — ABNORMAL LOW (ref 3.80–5.10)
RDW: 16.4 % — ABNORMAL HIGH (ref 11.0–15.0)
Total Lymphocyte: 18.5 %
WBC: 7.4 10*3/uL (ref 3.8–10.8)

## 2019-04-30 LAB — COMPLETE METABOLIC PANEL WITH GFR
AG Ratio: 1.6 (calc) (ref 1.0–2.5)
ALT: 12 U/L (ref 6–29)
AST: 16 U/L (ref 10–35)
Albumin: 4.1 g/dL (ref 3.6–5.1)
Alkaline phosphatase (APISO): 90 U/L (ref 37–153)
BUN/Creatinine Ratio: 15 (calc) (ref 6–22)
BUN: 18 mg/dL (ref 7–25)
CO2: 25 mmol/L (ref 20–32)
Calcium: 9.7 mg/dL (ref 8.6–10.4)
Chloride: 91 mmol/L — ABNORMAL LOW (ref 98–110)
Creat: 1.22 mg/dL — ABNORMAL HIGH (ref 0.60–0.88)
GFR, Est African American: 47 mL/min/{1.73_m2} — ABNORMAL LOW (ref >=60)
GFR, Est Non African American: 40 mL/min/{1.73_m2} — ABNORMAL LOW (ref >=60)
Globulin: 2.5 g/dL (calc) (ref 1.9–3.7)
Glucose, Bld: 198 mg/dL — ABNORMAL HIGH (ref 65–99)
Potassium: 4.2 mmol/L (ref 3.5–5.3)
Sodium: 130 mmol/L — ABNORMAL LOW (ref 135–146)
Total Bilirubin: 0.3 mg/dL (ref 0.2–1.2)
Total Protein: 6.6 g/dL (ref 6.1–8.1)

## 2019-04-30 LAB — LIPID PANEL
Cholesterol: 187 mg/dL (ref <200)
HDL: 56 mg/dL (ref >=50)
LDL Cholesterol (Calc): 102 mg/dL (calc) — ABNORMAL HIGH
Non-HDL Cholesterol (Calc): 131 mg/dL (calc) — ABNORMAL HIGH (ref <130)
Total CHOL/HDL Ratio: 3.3 (calc) (ref <5.0)
Triglycerides: 175 mg/dL — ABNORMAL HIGH (ref <150)

## 2019-04-30 LAB — T4, FREE: Free T4: 1.3 ng/dL (ref 0.8–1.8)

## 2019-04-30 LAB — T3, FREE: T3, Free: 3.3 pg/mL (ref 2.3–4.2)

## 2019-04-30 LAB — TSH: TSH: 3.01 mIU/L (ref 0.40–4.50)

## 2019-05-02 ENCOUNTER — Other Ambulatory Visit: Payer: Self-pay | Admitting: Internal Medicine

## 2019-05-02 DIAGNOSIS — D509 Iron deficiency anemia, unspecified: Secondary | ICD-10-CM

## 2019-05-04 ENCOUNTER — Ambulatory Visit: Payer: PPO | Admitting: Neurology

## 2019-05-04 ENCOUNTER — Encounter: Payer: Self-pay | Admitting: Internal Medicine

## 2019-05-04 ENCOUNTER — Non-Acute Institutional Stay: Payer: PPO | Admitting: Internal Medicine

## 2019-05-04 ENCOUNTER — Encounter: Payer: Self-pay | Admitting: Neurology

## 2019-05-04 ENCOUNTER — Other Ambulatory Visit: Payer: Self-pay

## 2019-05-04 VITALS — BP 142/58 | HR 62 | Temp 97.7°F | Ht 61.0 in | Wt 99.4 lb

## 2019-05-04 VITALS — BP 127/71 | HR 93 | Temp 97.5°F | Ht 61.0 in | Wt 101.5 lb

## 2019-05-04 DIAGNOSIS — D509 Iron deficiency anemia, unspecified: Secondary | ICD-10-CM

## 2019-05-04 DIAGNOSIS — E538 Deficiency of other specified B group vitamins: Secondary | ICD-10-CM | POA: Diagnosis not present

## 2019-05-04 DIAGNOSIS — R413 Other amnesia: Secondary | ICD-10-CM | POA: Diagnosis not present

## 2019-05-04 MED ORDER — MECLIZINE HCL 25 MG PO TABS: 25.0000 mg | ORAL_TABLET | Freq: Three times a day (TID) | ORAL | 2 refills | Status: DC | PRN

## 2019-05-04 NOTE — Progress Notes (Signed)
Location: [place] of Service:  Clinic (12)  Provider:   Code Status:  Goals of Care:  Advanced Directives 04/06/2019  Does Patient Have a Medical Advance Directive? No  Type of Advance Directive -  Does patient want to make changes to medical advance directive? -  Copy of [place] in Chart? -  Would patient like information on creating a medical advance directive? -  Pre-existing out of facility DNR order (yellow form or pink MOST form) -     Chief Complaint  Patient presents with   Medical Management of Chronic Issues    Patient recently seen neurologist.    HPI: Patient is a 86 y.o. female seen today for an acute visit for Follow up of her Anemia Patient is a new patient to our practice. I had done a routine CBC on her to follow-up of her low hemoglobin with microcytosis. Her hemoglobin came back at 7.1 with MCV of 77 Patient denies any blood in her stool.  No obvious signs of bleeding from anywhere else no hematuria no dysuria.  She has no abdominal pain no nausea no vomiting.  She denies any chest pain shortness of breath. She did have dizziness which she saw Dr. Jannifer Franklin this morning and according to his neurologist she has BPPV.  Patient was given a prescription for meclizine Patient does complain of constipation.   Patient states that she did have colonoscopy when she turned 18 and was told by Dr. Norwood [facility] that she does not need any more procedures. She also had a CT scan of her abdomen done in 11/20 which did not show any acute processes I had told her to start iron but she has not done that yet.  Past Medical History:  Diagnosis Date   BPPV (benign paroxysmal positional vertigo) 01/08/2016   Bradycardia    a. Coreg decreased due to HR low 40s on event monitor 2015/07/21 -> further decreased due to HR upper 30s in 01/2016.   Breast nodule 02/15/2019   CAD (coronary artery disease)    a. RV infarct 2005-07-20 c/b high grade AV  block with total occlusion of RCA, unable to treat with PCI. b. Nuc 10/2015: scar but no ischemia.   Carotid artery disease (HCC)    Chronic anemia    Chronic lower back pain    CKD (chronic kidney disease), stage III    Complication of anesthesia    "almost died when I had my breast reduction"   Diabetes ([place]) 07/20/2000   Dyslipidemia    GERD (gastroesophageal reflux disease)    Heart murmur    High cholesterol    History of hiatal hernia    HTN (hypertension)    Hyperthyroidism    Memory problem 02/15/2019   Meniere disease    "for years" takes Antivert 3 times per day (01/31/2016)   Myocardial infarction [facility]) 07-20-2005   "almost died"   Near syncope    Osteoporosis    Presbycusis of both ears 01/29/2016   Renal artery stenosis (HCC)    a. left renal artery stent July 21, 2010. b. s/p repeat left renal stenting 01/2016.   Stroke [facility])    a. Prior infarcts seen on head CT.   TMJ pain dysfunction syndrome 08/26/2016   Type II diabetes mellitus ([place])    "controlled w/diet and exercise only" (01/31/2016)   Weakness 02/26/2015    Past Surgical History:  Procedure Laterality Date   ANTERIOR CERVICAL DECOMP/DISCECTOMY FUSION  02/2002   Archie Endo 08/26/2010   APPENDECTOMY     BACK SURGERY     BLADDER SUSPENSION  x3   BREAST LUMPECTOMY  11/2007   breast needle-localized lumpectomy/notes 08/12/2012   CARDIAC CATHETERIZATION  2007   "couldn't get stents in; I almost died'   CATARACT EXTRACTION W/ INTRAOCULAR LENS  IMPLANT, BILATERAL Bilateral    CATARACT EXTRACTION W/ INTRAOCULAR LENS  IMPLANT, BILATERAL Bilateral    CHOLECYSTECTOMY OPEN     FRACTURE SURGERY     HIP FRACTURE SURGERY Right 2011   "added rod in my leg and a ball"   KYPHOPLASTY N/A 03/20/2015   Procedure: THORACIC TWELVE KYPHOPLASTY;  Surgeon: Jovita Gamma, MD;  Location: [place] NEURO ORS;  Service: Neurosurgery;  Laterality: N/A;   KYPHOPLASTY N/A 04/23/2015   Procedure: KYPHOPLASTY - LUMBAR ONE;   Surgeon: Jovita Gamma, MD;  Location: [place] NEURO ORS;  Service: Neurosurgery;  Laterality: N/A;  L1 Kyphoplasty   NECK HARDWARE REMOVAL  11/2002   "replaced screw"   PERIPHERAL VASCULAR CATHETERIZATION Left 12/20/2014   Procedure: Renal Angiography;  Surgeon: Wellington Hampshire, MD;  Location: [place] CV LAB;  Service: Cardiovascular;  Laterality: Left;   PERIPHERAL VASCULAR CATHETERIZATION N/A 01/31/2016   Procedure: Renal Angiography;  Surgeon: Lorretta Harp, MD;  Location: [place] CV LAB;  Service: Cardiovascular;  Laterality: N/A;   PERIPHERAL VASCULAR CATHETERIZATION N/A 01/31/2016   Procedure: Abdominal Aortogram;  Surgeon: Lorretta Harp, MD;  Location: [place] CV LAB;  Service: Cardiovascular;  Laterality: N/A;   PERIPHERAL VASCULAR CATHETERIZATION  01/31/2016   Procedure: Peripheral Vascular Intervention;  Surgeon: Lorretta Harp, MD;  Location: [place] CV LAB;  Service: Cardiovascular;;   REDUCTION MAMMAPLASTY Bilateral 1985   renal arteriogram  12/2014   occluded previous Lt renal stent and patent rt renal artery.   RENAL ARTERY STENT Left 2012   TONSILLECTOMY     TUBAL LIGATION     VAGINAL HYSTERECTOMY  ~ 1995   WRIST FRACTURE SURGERY Left 2012    Allergies  Allergen Reactions   Fentanyl Anaphylaxis    Cardiac arrest and coded   Fish Allergy Anaphylaxis and Rash   Morphine And Related Anaphylaxis    Cardiac arrest and coded Per Dr. Conley Canal patient has taken percocet without issue   Singulair [Montelukast] Palpitations and Other (See Comments)    Increased BP and HR   Actonel [Risedronate Sodium] Rash and Other (See Comments)    weakness   Dilaudid [Hydromorphone Hcl] Rash and Other (See Comments)    Crying and screaming   Fosamax [Alendronate Sodium] Rash and Other (See Comments)    Weakness and myalgias   Hydralazine Diarrhea and Other (See Comments)    Swelling in stomach   Lipitor [Atorvastatin] Swelling and Rash     Tongue swelling    Lisinopril Diarrhea, Rash and Cough   Micardis [Telmisartan] Diarrhea and Rash   Septra [Sulfamethoxazole-Trimethoprim] Nausea And Vomiting and Rash   Tekturna [Aliskiren] Swelling and Rash    Swelling of tongue   Tricor [Fenofibrate] Rash and Other (See Comments)    Flu symptoms   Imdur [Isosorbide Dinitrate] Other (See Comments)    Headaches and blindness   Amlodipine Diarrhea   Avapro [Irbesartan] Rash   Cardio Complete [Nutritional Supplements] Rash   Cardizem [Diltiazem Hcl] Rash   Ciprofloxacin Rash and Other (See Comments)    Black spot on body   Codeine Rash   Cyclobenzaprine Nausea Only   Forteo [Parathyroid Hormone (Recomb)] Rash  Inapsine [Droperidol] Rash   Ivp Dye [Iodinated Diagnostic Agents] Rash   Shellfish Allergy Rash   Tussionex Pennkinetic Er [Hydrocod Polst-Cpm Polst Er] Itching and Rash    Outpatient Encounter Medications as of 05/04/2019  Medication Sig   acetaminophen (TYLENOL) 500 MG tablet Take 1,000 mg by mouth every 8 (eight) hours as needed for mild pain (for pain.).    ALPRAZolam (XANAX) 0.25 MG tablet Take 0.125 mg by mouth at bedtime as needed for anxiety or sleep.   aspirin 81 MG tablet Take 1 tablet (81 mg total) by mouth daily.   azelastine (ASTELIN) 0.1 % nasal spray Place 1-2 sprays into both nostrils daily as needed. For allergies/congestion.   betamethasone dipropionate (DIPROLENE) 0.05 % cream Apply 1 application topically daily as needed (Skin care).    cholecalciferol (VITAMIN D) 1000 UNITS tablet Take 2,000 Units by mouth daily.    cloNIDine (CATAPRES) 0.1 MG tablet TAKE TWO TABLETS BY MOUTH IN THE MORNING, ONE TABLET AT 4 PM AND TWO TABLETS AT 10 PM DAILY   clopidogrel (PLAVIX) 75 MG tablet Take 1 tablet (75 mg total) by mouth daily. Please make overdue appt for future refills. [phone]. 2nd attempt. (Patient taking differently: Take 75 mg by mouth daily. )   denosumab (PROLIA) 60 MG/ML  SOLN injection Inject 60 mg into the skin every 6 (six) months. Administer in upper arm, thigh, or abdomen   docusate sodium (COLACE) 100 MG capsule Take 200 mg by mouth at bedtime as needed for mild constipation.   fexofenadine (ALLEGRA) 180 MG tablet Take 180 mg by mouth daily.   furosemide (LASIX) 20 MG tablet Take 1 tablet (20 mg total) by mouth 3 (three) times a week. Mon, Wed, and Fri only.   meclizine (ANTIVERT) 25 MG tablet Take 1 tablet (25 mg total) by mouth 3 (three) times daily as needed for dizziness.   metFORMIN (GLUCOPHAGE-XR) 500 MG 24 hr tablet Take 500 mg by mouth daily with supper.    methimazole (TAPAZOLE) 5 MG tablet Take 5 mg by mouth daily.   nitroGLYCERIN (NITROSTAT) 0.4 MG SL tablet Place 0.4 mg under the tongue every 5 (five) minutes as needed for chest pain (3 doses MAX).   OneTouch Delica Lancets [id number] MISC USE AS DIRECTED TO CHECK BLOOD SUGAR DAILY AND AS NEEDED E11.9   ONETOUCH ULTRA test strip USE AS DIRECTED TO CHECK BLOOD SUGARS DAILY AS NEEDED   pantoprazole (PROTONIX) 40 MG tablet TAKE 1 TABLET BY MOUTH TWICE A DAY BEFORE A MEAL (Patient taking differently: Take 40 mg by mouth 2 (two) times daily. )   Polyethyl Glycol-Propyl Glycol (SYSTANE FREE OP) Place 2 drops into both eyes at bedtime.   simvastatin (ZOCOR) 20 MG tablet Take 20 mg by mouth as directed. Take one tablet every Monday, Wednesday, Friday   telmisartan (MICARDIS) 40 MG tablet Take 20 mg by mouth 2 (two) times daily.   No facility-administered encounter medications on file as of 05/04/2019.    Review of Systems:  Review of Systems  All 10 systems were reviewed and were negative except mentioned in presenting complaint  Health Maintenance  Topic Date Due   FOOT EXAM  02/13/1942   OPHTHALMOLOGY EXAM  02/13/1942   PNA vac Low Risk Adult (2 of 2 - PCV13) 12/14/2015   HEMOGLOBIN A1C  08/16/2019   TETANUS/TDAP  04/05/2029   INFLUENZA VACCINE  Completed   DEXA SCAN  Completed     Physical Exam: Vitals:   05/04/19 1540  BP: [name])  142/58  Pulse: 62  Temp: 97.7 F (36.5 C)  SpO2: 93%  Weight: 99 lb 6.4 oz (45.1 kg)  Height: 5\' 1"  (1.549 m)   Body mass index is 18.78 kg/m. Physical Exam  Constitutional: Oriented to person, place, and time. Well-developed and well-nourished.  HENT:  Head: Normocephalic. Head wound Has healed Mouth/Throat: Oropharynx is clear and moist.  Eyes: Pupils are equal, round, and reactive to light.  Neck: Neck supple.  Cardiovascular: Normal rate and normal heart sounds.  Murmur Present Pulmonary/Chest: Effort normal and breath sounds normal. No respiratory distress. No wheezes. She has no rales.  Abdominal: Soft. Bowel sounds are normal. No distension. There is no tenderness. There is no rebound.  Rectal Exam done No Stool in the Rectum unable to do Guaiac Musculoskeletal: No edema.  Lymphadenopathy: none Neurological: Alert and oriented to person, place, and time. Was able to walk with walker. No Dizziness  Skin: Skin is warm and dry.  Psychiatric: Normal mood and affect. Behavior is normal. Thought content normal.    Labs reviewed: Basic Metabolic Panel: Recent Labs    02/15/19 1313 02/15/19 1945 02/16/19 0500 02/21/19 1439 03/08/19 1422 03/25/19 1554 04/29/19 1415  NA   < >  --  128*   < > 136 130* 130*  K   < >  --  3.4*   < > 5.3* 4.5 4.2  CL   < >  --  92*   < > 100 91* 91*  CO2   < >  --  23   < > 25 26 25   GLUCOSE   < >  --  162*   < > 95 216* 198*  BUN   < >  --  13   < > 20 20 18   CREATININE   < >  --  1.05*   < > 1.17* 1.14* 1.22*  CALCIUM   < >  --  9.0   < > 9.7 9.3 9.7  MG  --   --  1.5*  --   --   --   --   PHOS  --   --  4.1  --   --   --   --   TSH  --  2.464  --   --   --   --  3.01   < > = values in this interval not displayed.   Liver Function Tests: Recent Labs    02/15/19 1313 02/16/19 0500 04/29/19 1415  AST 23 19 16   ALT 17 15 12   ALKPHOS 68 67  --   BILITOT 0.9 0.7 0.3  PROT  6.5 5.7* 6.6  ALBUMIN 3.6 3.1*  --    No results for input(s): LIPASE, AMYLASE in the last 8760 hours. No results for input(s): AMMONIA in the last 8760 hours. CBC: Recent Labs    02/15/19 1313 02/16/19 0500 02/21/19 1439 02/25/19 1339 04/29/19 1415  WBC 5.9   < > 8.4 8.0 7.4  NEUTROABS 4.7  --   --  6.0 5,491  HGB 9.8*   < > 8.8* 9.2* 7.1*  HCT 31.2*   < > 28.6* 29.8* 23.1*  MCV 74.6*   < > 77.1* 75* 77.0*  PLT 472*   < > 427* 503* 530*   < > = values in this interval not displayed.   Lipid Panel: Recent Labs    04/29/19 1415  CHOL 187  HDL 56  LDLCALC 102*  TRIG 175*  CHOLHDL 3.3  Lab Results  Component Value Date   HGBA1C 6.8 (H) 02/16/2019    Procedures since last visit: DG Pelvis 1-2 Views  Result Date: 04/08/2019 CLINICAL DATA:  Multiple falls, denies hip pain EXAM: PELVIS - 1-2 VIEW COMPARISON:  Radiograph 02/06/2015, CT abdomen pelvis 02/15/2019 FINDINGS: Bones of the pelvis are intact and congruent. Femoral heads are normally located. Prior right femoral intramedullary with transcervical fixation is unchanged from comparison exams. No evidence of acute hardware complication. Radiodense coils projecting over the sacrum have an appearance most compatible with mesh anchors. Vascular calcium noted in the pelvis. Bowel gas pattern is normal. Degenerative changes are present in the lower lumbar spine with marked levocurvature similar to prior, SI joints and both hips. Heterotopic calcification adjacent the right greater trochanter is similar to prior. IMPRESSION: 1. No acute fracture or traumatic malalignment. 2. Diffuse osteopenia. 3. Stable postoperative appearance of the right hip without evidence of hardware complication. 4. Stable heterotopic ossification adjacent to the right greater trochanter. Electronically Signed   By: Lovena Le M.D.   On: 04/08/2019 00:45   CT Head Wo Contrast  Result Date: 04/08/2019 CLINICAL DATA:  Multiple falls, fell yesterday with  scalp staples EXAM: CT HEAD WITHOUT CONTRAST CT CERVICAL SPINE WITHOUT CONTRAST TECHNIQUE: Multidetector CT imaging of the head and cervical spine was performed following the standard protocol without intravenous contrast. Multiplanar CT image reconstructions of the cervical spine were also generated. COMPARISON:  CT head 04/06/2019, CT cervical spine 02/15/2019 FINDINGS: CT HEAD FINDINGS Brain: Remote bilateral basal ganglia lacune is. No evidence of acute infarction, hemorrhage, hydrocephalus, extra-axial collection or mass lesion/mass effect. Symmetric prominence of the ventricles, cisterns and sulci compatible with parenchymal volume loss. Confluent areas of white matter hypoattenuation are most compatible with chronic microvascular angiopathy. Vascular: Atherosclerotic calcification of the carotid siphons and intradural vertebral arteries. No hyperdense vessel. Skull: Posterior scalp swelling and skin staples with overlying bandaging material. Some right parieto-occipital swelling is new from comparison 04/06/2019 with trace lentiform scalp hematoma measuring up to 3 mm in thickness. No subjacent calvarial fracture. No suspicious osseous lesions. Sinuses/Orbits: Paranasal sinuses and mastoid air cells are predominantly clear. Orbital structures are unremarkable aside from prior lens extractions. Remote nasal bone fractures similar to prior. Other: Bilateral TMJ arthrosis. CT CERVICAL SPINE FINDINGS Alignment: Chronic anterolisthesis of C2 in 3 of approximately 2.5 mm, similar to comparison study. Additional minimal anterolisthesis of C7 on T1, also stable from prior. Bony fusion of the C3-C5 levels. Anterior plate and screw fixation of the C5-C7 levels with bony fusion across C6 and C7. No abnormally widened facets. Bony fusion of the bilateral C3-C5 and C6-C7 facets. Skull base and vertebrae: Degenerative mineralization is adjacent the dens are similar to prior with calcific pannus formation. No acute fracture  or vertebral body height loss. No suspicious osseous lesions. The osseous structures appear diffusely demineralized which may limit detection of small or nondisplaced fractures. Soft tissues and spinal canal: No pre or paravertebral fluid or swelling. No visible canal hematoma. Disc levels: Multilevel fusions of the cervical spine as above. There is mild bony canal stenosis from C3-C5. Uncinate spurring and facet hypertrophic changes at the unfused levels result in mild to moderate multilevel neural foraminal narrowing. Additional pronounced spondylitic endplate changes at [id number]. Upper chest: Extensive calcification of the proximal great vessels. Biapical pleuroparenchymal scarring and some bronchiectatic change in the left apex are similar to prior. Other: Heterogeneously enlarged and multinodular thyroid gland. There is a 2.7 cm hypoattenuating right thyroid lobe nodule  as well as a 2.1 cm isthmic nodule similar to the comparison study. IMPRESSION: 1. Increasing posterior scalp swelling with new trace right parietal scalp hematoma without subjacent calvarial fracture. 2. No acute intracranial abnormality. 3. Chronic microvascular angiopathy and parenchymal volume loss, similar to priors. Stable bilateral lacunar infarcts in the basal ganglia. 4. No acute cervical spine fracture. 5. Multilevel cervical fusion. Bony fusion C3-C5 and C6-C7 with instrumented anterior fixation C5-C7. 6. Mild canal stenosis C3-C5 levels. Multilevel mild neural foraminal narrowing. 7. Heterogeneously enlarged multinodular thyroid gland. Further assessment by dedicated thyroid ultrasound is recommended unless contraindicated by age and/or comorbidities following clinical discussion. This follows consensus guidelines: Managing Incidental Thyroid Nodules Detected on Imaging: White Paper of the ACR Incidental Thyroid Findings Committee. J Am Coll Radiol 2015; 12:143-150. and [facility] 3-tiered system for managing ITNs: J Am Coll Radiol. 2015;  Feb;12(2): 143-50 Electronically Signed   By: Lovena Le M.D.   On: 04/08/2019 00:57   CT Head Wo Contrast  Result Date: 04/06/2019 CLINICAL DATA:  Fall, head trauma EXAM: CT HEAD WITHOUT CONTRAST TECHNIQUE: Contiguous axial images were obtained from the base of the skull through the vertex without intravenous contrast. COMPARISON:  02/15/2019 FINDINGS: Brain: There is no acute intracranial hemorrhage, mass-effect, or edema. Gray-white differentiation is preserved. There is no extra-axial fluid collection. Mild prominence of the ventricles and sulci reflects stable parenchymal volume loss. Patchy and confluent hypoattenuation in the supratentorial white matter likely reflects stable chronic microvascular ischemic changes. Vascular: There is atherosclerotic calcification at the skull base. Skull: Calvarium is unremarkable. Sinuses/Orbits: No acute finding. Other: Posterior scalp soft tissue swelling with overlying gauze. IMPRESSION: No evidence of acute intracranial injury. Stable chronic findings detailed above. Electronically Signed   By: Macy Mis M.D.   On: 04/06/2019 15:32   CT Cervical Spine Wo Contrast  Result Date: 04/08/2019 CLINICAL DATA:  Multiple falls, fell yesterday with scalp staples EXAM: CT HEAD WITHOUT CONTRAST CT CERVICAL SPINE WITHOUT CONTRAST TECHNIQUE: Multidetector CT imaging of the head and cervical spine was performed following the standard protocol without intravenous contrast. Multiplanar CT image reconstructions of the cervical spine were also generated. COMPARISON:  CT head 04/06/2019, CT cervical spine 02/15/2019 FINDINGS: CT HEAD FINDINGS Brain: Remote bilateral basal ganglia lacune is. No evidence of acute infarction, hemorrhage, hydrocephalus, extra-axial collection or mass lesion/mass effect. Symmetric prominence of the ventricles, cisterns and sulci compatible with parenchymal volume loss. Confluent areas of white matter hypoattenuation are most compatible with  chronic microvascular angiopathy. Vascular: Atherosclerotic calcification of the carotid siphons and intradural vertebral arteries. No hyperdense vessel. Skull: Posterior scalp swelling and skin staples with overlying bandaging material. Some right parieto-occipital swelling is new from comparison 04/06/2019 with trace lentiform scalp hematoma measuring up to 3 mm in thickness. No subjacent calvarial fracture. No suspicious osseous lesions. Sinuses/Orbits: Paranasal sinuses and mastoid air cells are predominantly clear. Orbital structures are unremarkable aside from prior lens extractions. Remote nasal bone fractures similar to prior. Other: Bilateral TMJ arthrosis. CT CERVICAL SPINE FINDINGS Alignment: Chronic anterolisthesis of C2 in 3 of approximately 2.5 mm, similar to comparison study. Additional minimal anterolisthesis of C7 on T1, also stable from prior. Bony fusion of the C3-C5 levels. Anterior plate and screw fixation of the C5-C7 levels with bony fusion across C6 and C7. No abnormally widened facets. Bony fusion of the bilateral C3-C5 and C6-C7 facets. Skull base and vertebrae: Degenerative mineralization is adjacent the dens are similar to prior with calcific pannus formation. No acute fracture or vertebral body  height loss. No suspicious osseous lesions. The osseous structures appear diffusely demineralized which may limit detection of small or nondisplaced fractures. Soft tissues and spinal canal: No pre or paravertebral fluid or swelling. No visible canal hematoma. Disc levels: Multilevel fusions of the cervical spine as above. There is mild bony canal stenosis from C3-C5. Uncinate spurring and facet hypertrophic changes at the unfused levels result in mild to moderate multilevel neural foraminal narrowing. Additional pronounced spondylitic endplate changes at QA348G. Upper chest: Extensive calcification of the proximal great vessels. Biapical pleuroparenchymal scarring and some bronchiectatic change in  the left apex are similar to prior. Other: Heterogeneously enlarged and multinodular thyroid gland. There is a 2.7 cm hypoattenuating right thyroid lobe nodule as well as a 2.1 cm isthmic nodule similar to the comparison study. IMPRESSION: 1. Increasing posterior scalp swelling with new trace right parietal scalp hematoma without subjacent calvarial fracture. 2. No acute intracranial abnormality. 3. Chronic microvascular angiopathy and parenchymal volume loss, similar to priors. Stable bilateral lacunar infarcts in the basal ganglia. 4. No acute cervical spine fracture. 5. Multilevel cervical fusion. Bony fusion C3-C5 and C6-C7 with instrumented anterior fixation C5-C7. 6. Mild canal stenosis C3-C5 levels. Multilevel mild neural foraminal narrowing. 7. Heterogeneously enlarged multinodular thyroid gland. Further assessment by dedicated thyroid ultrasound is recommended unless contraindicated by age and/or comorbidities following clinical discussion. This follows consensus guidelines: Managing Incidental Thyroid Nodules Detected on Imaging: White Paper of the ACR Incidental Thyroid Findings Committee. J Am Coll Radiol 2015; 12:143-150. and [facility] 3-tiered system for managing ITNs: J Am Coll Radiol. 2015; Feb;12(2): 143-50 Electronically Signed   By: Lovena Le M.D.   On: 04/08/2019 00:57    Assessment/Plan Iron deficiency anemia Patient does not have any obvious signs of bleeding She is asymptomatic at this time We will start her iron today Will have another CBC drawn in few days If it is less than 7 then she would need transfusion I am going to go ahead and make a Urgent  GI referral for her to rule out GI bleed.   Labs/tests ordered:  * No order type specified * Next appt:  Visit date not found

## 2019-05-04 NOTE — Progress Notes (Signed)
Reason for visit: Dementia, vertigo  Referring physician: Dr. Eddie North is a 86 y.o. female  History of present illness:  [name] is an 86 year old right-handed white female with a lifelong history she claims of intermittent episodes of vertigo.  The patient will have vertigo oftentimes when she first wakes up in the morning, and it may persist for 3 to 4 days.  The patient may have nausea or vomiting with the events, she does not get headaches, she has never had headaches.  The patient reports that she has been seen through ENT previously, she has had vestibular rehabilitation without benefit.  The episodes oftentimes will make her fall.  The patient is using a walker for ambulation, she claims that she may fall even with a walker.  She has learned not to try to walk when she feels vertigo.  The patient has had CT and MRI evaluation of the brain, she has extensive white matter changes throughout the brain and some involvement in the brainstem as well.  She is on Plavix.  She has developed over the last year some problems with memory as well.  She has had some problems with delusional thinking and some occasional hallucinations.  The patient currently lives at [facility] with her husband.  She has a caretaker who comes in to help out 5 days a week.  The patient has been noted to have a Mini-Mental status examination score 23/30 previously.  The patient has been through physical therapy recently for the walking but this was not extremely helpful.  The patient oftentimes will try to walk and forget to use the walker.  She had been on meclizine previously, this has helped her vertigo significantly.  The meclizine was discontinued, the patient is unsure exactly why.  The patient has had recent falls 2 days ago, she was in the emergency room on the 23rd and 04/07/2019 for falls.  Past Medical History:  Diagnosis Date  . BPPV (benign paroxysmal positional vertigo) 01/08/2016  .  Bradycardia    a. Coreg decreased due to HR low 40s on event monitor 08/02/2015 -> further decreased due to HR upper 30s in 01/2016.  . Breast nodule 02/15/2019  . CAD (coronary artery disease)    a. RV infarct Aug 01, 2005 c/b high grade AV block with total occlusion of RCA, unable to treat with PCI. b. Nuc 10/2015: scar but no ischemia.  . Carotid artery disease ([place])   . Chronic anemia   . Chronic lower back pain   . CKD (chronic kidney disease), stage III   . Complication of anesthesia    "almost died when I had my breast reduction"  . Diabetes ([place]) Aug 01, 2000  . Dyslipidemia   . GERD (gastroesophageal reflux disease)   . Heart murmur   . High cholesterol   . History of hiatal hernia   . HTN (hypertension)   . Hyperthyroidism   . Memory problem 02/15/2019  . Meniere disease    "for years" takes Antivert 3 times per day (01/31/2016)  . Myocardial infarction [facility]) August 01, 2005   "almost died"  . Near syncope   . Osteoporosis   . Presbycusis of both ears 01/29/2016  . Renal artery stenosis ([facility])    a. left renal artery stent 08/02/10. b. s/p repeat left renal stenting 01/2016.  . Stroke [facility])    a. Prior infarcts seen on head CT.  . TMJ pain dysfunction syndrome 08/26/2016  . Type II diabetes mellitus ([place])    "  controlled w/diet and exercise only" (01/31/2016)  . Weakness 02/26/2015    Past Surgical History:  Procedure Laterality Date  . ANTERIOR CERVICAL DECOMP/DISCECTOMY FUSION  02/2002   Archie Endo 08/26/2010  . APPENDECTOMY    . BACK SURGERY    . BLADDER SUSPENSION  x3  . BREAST LUMPECTOMY  11/2007   breast needle-localized lumpectomy/notes 08/12/2012  . CARDIAC CATHETERIZATION  2007   "couldn't get stents in; I almost died'  . CATARACT EXTRACTION W/ INTRAOCULAR LENS  IMPLANT, BILATERAL Bilateral   . CATARACT EXTRACTION W/ INTRAOCULAR LENS  IMPLANT, BILATERAL Bilateral   . CHOLECYSTECTOMY OPEN    . FRACTURE SURGERY    . HIP FRACTURE SURGERY Right 2011   "added rod in my leg and a ball"  . KYPHOPLASTY  N/A 03/20/2015   Procedure: THORACIC TWELVE KYPHOPLASTY;  Surgeon: Jovita Gamma, MD;  Location: [place] NEUR[facility] ORS;  Service: Neurosurgery;  Laterality: N/A;  . KYPHOPLASTY N/A 04/23/2015   Procedure: KYPHOPLASTY - LUMBAR ONE;  Surgeon: Jovita Gamma, MD;  Location: [place] NEUR[facility] ORS;  Service: Neurosurgery;  Laterality: N/A;  L1 Kyphoplasty  . NECK HARDWARE REMOVAL  11/2002   "replaced screw"  . PERIPHERAL VASCULAR CATHETERIZATION Left 12/20/2014   Procedure: Renal Angiography;  Surgeon: Wellington Hampshire, MD;  Location: [place] CV LAB;  Service: Cardiovascular;  Laterality: Left;  . PERIPHERAL VASCULAR CATHETERIZATION N/A 01/31/2016   Procedure: Renal Angiography;  Surgeon: Lorretta Harp, MD;  Location: [place] CV LAB;  Service: Cardiovascular;  Laterality: N/A;  . PERIPHERAL VASCULAR CATHETERIZATION N/A 01/31/2016   Procedure: Abdominal Aortogram;  Surgeon: Lorretta Harp, MD;  Location: [place] CV LAB;  Service: Cardiovascular;  Laterality: N/A;  . PERIPHERAL VASCULAR CATHETERIZATION  01/31/2016   Procedure: Peripheral Vascular Intervention;  Surgeon: Lorretta Harp, MD;  Location: [place] CV LAB;  Service: Cardiovascular;;  . REDUCTION MAMMAPLASTY Bilateral 1985  . renal arteriogram  12/2014   occluded previous Lt renal stent and patent rt renal artery.  [name] RENAL ARTERY STENT Left 2012  . TONSILLECTOMY    . TUBAL LIGATION    . VAGINAL HYSTERECTOMY  ~ 1995  . WRIST FRACTURE SURGERY Left 2012    Family History  Problem Relation Age of Onset  . Diabetes type II Mother   . Heart attack Mother   . Heart disease Mother   . Stroke Mother   . Congestive Heart Failure Father   . Pulmonary embolism Father   . Alzheimer's disease Sister   . Heart disease Sister   . Heart disease Brother   . Healthy Sister   . Healthy Sister   . Stroke Brother   . Dementia Brother        VASCULAR  . Stroke Brother   . Diabetes type II Brother   . Other Brother        KILLED IN PLANE CRASH   . Pulmonary disease Sister   . Heart attack Sister   . Kidney failure Sister   . Diabetes type II Sister   . Diabetes type II Sister   . Kidney failure Sister     Social history:  reports that she has quit smoking. Her smoking use included cigarettes. She has a 30.00 pack-year smoking history. She has never used smokeless tobacco. She reports that she does not drink alcohol or use drugs.  Medications:  Prior to Admission medications   Medication Sig Start Date End Date Taking? Authorizing Provider  acetaminophen (TYLENOL) 500 MG tablet Take 1,000 mg  by mouth every 8 (eight) hours as needed for mild pain (for pain.).    Yes [provider]  ALPRAZolam (XANAX) 0.25 MG tablet Take 0.125 mg by mouth at bedtime as needed for anxiety or sleep.   Yes [provider]  aspirin 81 MG tablet Take 1 tablet (81 mg total) by mouth daily. 02/01/16  Yes Dunn, Dayna N, PA-C  azelastine (ASTELIN) 0.1 % nasal spray Place 1-2 sprays into both nostrils daily as needed. For allergies/congestion. 12/14/15  Yes [provider]  betamethasone dipropionate (DIPROLENE) 0.05 % cream Apply 1 application topically daily as needed (Skin care).  12/10/18  Yes [provider]  cholecalciferol (VITAMIN D) 1000 UNITS tablet Take 2,000 Units by mouth daily.    Yes [provider]  cloNIDine (CATAPRES) 0.1 MG tablet TAKE TWO TABLETS BY MOUTH IN THE MORNING, ONE TABLET AT 4 PM AND TWO TABLETS AT 10 PM DAILY 04/28/19  Yes Richardson Dopp T, PA-C  clopidogrel (PLAVIX) 75 MG tablet Take 1 tablet (75 mg total) by mouth daily. Please make overdue appt for future refills. [phone]. 2nd attempt. Patient taking differently: Take 75 mg by mouth daily.  04/20/18  Yes Belva Crome, MD  denosumab (PROLIA) 60 MG/ML SOLN injection Inject 60 mg into the skin every 6 (six) months. Administer in upper arm, thigh, or abdomen   Yes [provider]  docusate sodium (COLACE) 100 MG capsule Take 200  mg by mouth at bedtime as needed for mild constipation.   Yes [provider]  fexofenadine (ALLEGRA) 180 MG tablet Take 180 mg by mouth daily.   Yes [provider]  furosemide (LASIX) 20 MG tablet Take 1 tablet (20 mg total) by mouth 3 (three) times a week. Mon, Wed, and Fri only. 02/28/19 05/29/19 Yes Weaver, Scott T, PA-C  metFORMIN (GLUCOPHAGE-XR) 500 MG 24 hr tablet Take 500 mg by mouth daily with supper.  11/28/18  Yes [provider]  methimazole (TAPAZOLE) 5 MG tablet Take 5 mg by mouth daily. 08/27/15  Yes [provider]  nitroGLYCERIN (NITROSTAT) 0.4 MG SL tablet Place 0.4 mg under the tongue every 5 (five) minutes as needed for chest pain (3 doses MAX).   Yes [provider]  OneTouch Delica Lancets 99991111 MISC USE AS DIRECTED TO CHECK BLOOD SUGAR DAILY AND AS NEEDED E11.9 12/04/18  Yes [provider]  ONETOUCH ULTRA test strip USE AS DIRECTED TO CHECK BLOOD SUGARS DAILY AS NEEDED 11/19/18  Yes [provider]  pantoprazole (PROTONIX) 40 MG tablet TAKE 1 TABLET BY MOUTH TWICE A DAY BEFORE A MEAL Patient taking differently: Take 40 mg by mouth 2 (two) times daily.  03/24/16  Yes Dunn, Dayna N, PA-C  Polyethyl Glycol-Propyl Glycol (SYSTANE FREE OP) Place 2 drops into both eyes at bedtime.   Yes [provider]  simvastatin (ZOCOR) 20 MG tablet Take 20 mg by mouth as directed. Take one tablet every Monday, Wednesday, Friday   Yes [provider]  telmisartan (MICARDIS) 40 MG tablet Take 20 mg by mouth 2 (two) times daily.   Yes [provider]      Allergies  Allergen Reactions  . Fentanyl Anaphylaxis    Cardiac arrest and coded  . Fish Allergy Anaphylaxis and Rash  . Morphine And Related Anaphylaxis    Cardiac arrest and coded Per Dr. Conley Canal patient has taken percocet without issue  . Singulair [Montelukast] Palpitations and Other (See Comments)    Increased BP and HR  .  Actonel [Risedronate  Sodium] Rash and Other (See Comments)    weakness  . Dilaudid [Hydromorphone Hcl] Rash and Other (See Comments)    Crying and screaming  . Fosamax [Alendronate Sodium] Rash and Other (See Comments)    Weakness and myalgias  . Hydralazine Diarrhea and Other (See Comments)    Swelling in stomach  . Lipitor [Atorvastatin] Swelling and Rash    Tongue swelling   . Lisinopril Diarrhea, Rash and Cough  . Micardis [Telmisartan] Diarrhea and Rash  . Septra [Sulfamethoxazole-Trimethoprim] Nausea And Vomiting and Rash  . Tekturna [Aliskiren] Swelling and Rash    Swelling of tongue  . Tricor [Fenofibrate] Rash and Other (See Comments)    Flu symptoms  . Imdur [Isosorbide Dinitrate] Other (See Comments)    Headaches and blindness  . Amlodipine Diarrhea  . Avapro [Irbesartan] Rash  . Cardio Complete [Nutritional Supplements] Rash  . Cardizem [Diltiazem Hcl] Rash  . Ciprofloxacin Rash and Other (See Comments)    Black spot on body  . Codeine Rash  . Cyclobenzaprine Nausea Only  . Forteo [Parathyroid Hormone (Recomb)] Rash  . Inapsine [Droperidol] Rash  . Ivp Dye [Iodinated Diagnostic Agents] Rash  . Shellfish Allergy Rash  . Tussionex Pennkinetic Er [Hydrocod Polst-Cpm Polst Er] Itching and Rash    ROS:  Out of a complete 14 system review of symptoms, the patient complains only of the following symptoms, and all other reviewed systems are negative.  Dizziness, vertigo Walking difficulty Memory problems  Blood pressure 127/71, pulse 93, temperature (!) 97.5 F (36.4 C), height 5\' 1"  (1.549 m), weight 101 lb 8 oz (46 kg).  Physical Exam  General: The patient is alert and cooperative at the time of the examination.  Eyes: Pupils are equal, round, and reactive to light. Discs are flat bilaterally.  Neck: The neck is supple, no carotid bruits are noted on the left, a carotid bruit is noted on the right versus a heart murmur radiation.  Respiratory: The respiratory examination is  clear.  Cardiovascular: The cardiovascular examination reveals a regular rate and rhythm, a grade III/VI systolic murmur is noted maximal at the left lower sternal border.  Skin: Extremities are without significant edema.  Neurologic Exam  Mental status: The patient is alert and oriented x 3 at the time of the examination. The Mini-Mental status examination done today shows a total score 25/30.  The patient is able to name 6 four-legged animals in 60 seconds.  Cranial nerves: Facial symmetry is present. There is good sensation of the face to pinprick and soft touch bilaterally. The strength of the facial muscles and the muscles to head turning and shoulder shrug are normal bilaterally. Speech is well enunciated, no aphasia or dysarthria is noted. Extraocular movements are full. Visual fields are full. The tongue is midline, and the patient has symmetric elevation of the soft palate. No obvious hearing deficits are noted.  Motor: The motor testing reveals 5 over 5 strength of all 4 extremities. Good symmetric motor tone is noted throughout.  Sensory: Sensory testing is intact to pinprick, soft touch, vibration sensation, and position sense on all 4 extremities, with exception of the may be a slight stocking pattern sensory deficit to pinprick across the ankles bilaterally. No evidence of extinction is noted.  Coordination: Cerebellar testing reveals good finger-nose-finger and heel-to-shin bilaterally.  Gait and station: Gait is wide-based, the patient can walk independently, usually uses a walker.  Tandem gait was not attempted.  Romberg is negative.  Reflexes: Deep  tendon reflexes are symmetric and normal bilaterally, with exception of some slight depression at the ankle jerks bilaterally. Toes are downgoing bilaterally.   Assessment/Plan:  1.  Episodic vertigo, lifelong  2.  Memory disorder, dementia  3.  Gait disorder, multiple falls  The patient clearly relates her vertigo with her  falls.  She has developed a dementia, she is having some occasional hallucinations and delusional thinking.  She may forget to use her walker increasing her risk for falls.  She has been through physical therapy recently.  She believes that meclizine does help her vertigo, this was discontinued possibly because of the memory problems.  The patient will be sent for blood work today.  MRI of the brain shows extensive white matter changes that likely have some bearing on her memory and gait problems and possibly the dizziness.  The patient may have a concurrent Alzheimer's process as well.  The patient will be given a small prescription for the meclizine, the family will look out for increased confusion.  The patient will follow-up here in 4 months.  We will follow the memory issues over time.  We may consider addition of a medication for memory in the future.  Jill Alexanders MD 05/04/2019 11:20 AM  [facility] [address] [place], [place] [address]  Phone [phone] Fax [phone]

## 2019-05-05 LAB — SEDIMENTATION RATE: Sed Rate: 30 mm/hr (ref 0–40)

## 2019-05-05 LAB — VITAMIN B12: Vitamin B-12: 528 pg/mL (ref 232–1245)

## 2019-05-05 LAB — RPR: RPR Ser Ql: NONREACTIVE

## 2019-05-09 ENCOUNTER — Other Ambulatory Visit: Payer: Self-pay

## 2019-05-09 DIAGNOSIS — D509 Iron deficiency anemia, unspecified: Secondary | ICD-10-CM

## 2019-05-09 LAB — CBC WITH DIFFERENTIAL/PLATELET
Absolute Monocytes: 572 cells/uL (ref 200–950)
Basophils Absolute: 21 cells/uL (ref 0–200)
Basophils Relative: 0.2 %
Eosinophils Absolute: 94 cells/uL (ref 15–500)
Eosinophils Relative: 0.9 %
HCT: 24 % — ABNORMAL LOW (ref 35.0–45.0)
Hemoglobin: 7.3 g/dL — ABNORMAL LOW (ref 11.7–15.5)
Lymphs Abs: 1342 cells/uL (ref 850–3900)
MCH: 23.2 pg — ABNORMAL LOW (ref 27.0–33.0)
MCHC: 30.4 g/dL — ABNORMAL LOW (ref 32.0–36.0)
MCV: 76.2 fL — ABNORMAL LOW (ref 80.0–100.0)
MPV: 8.9 fL (ref 7.5–12.5)
Monocytes Relative: 5.5 %
Neutro Abs: 8372 cells/uL — ABNORMAL HIGH (ref 1500–7800)
Neutrophils Relative %: 80.5 %
Platelets: 498 10*3/uL — ABNORMAL HIGH (ref 140–400)
RBC: 3.15 10*6/uL — ABNORMAL LOW (ref 3.80–5.10)
RDW: 15.9 % — ABNORMAL HIGH (ref 11.0–15.0)
Total Lymphocyte: 12.9 %
WBC: 10.4 10*3/uL (ref 3.8–10.8)

## 2019-05-10 ENCOUNTER — Other Ambulatory Visit: Payer: Self-pay

## 2019-05-11 ENCOUNTER — Telehealth: Payer: Self-pay | Admitting: Neurology

## 2019-05-11 NOTE — Telephone Encounter (Signed)
I called pts husband and verified that the meclizine is three times a day as needed per Dr. Jannifer Franklin note. The husband verbalized understanding and will tell his wife.

## 2019-05-11 NOTE — Telephone Encounter (Signed)
Pt called needing to speak to the RN about the dosage for her meclizine (ANTIVERT) 25 MG tablet Please advise.

## 2019-06-01 ENCOUNTER — Other Ambulatory Visit: Payer: Self-pay

## 2019-06-01 ENCOUNTER — Encounter: Payer: Self-pay | Admitting: Internal Medicine

## 2019-06-01 ENCOUNTER — Non-Acute Institutional Stay: Payer: PPO | Admitting: Internal Medicine

## 2019-06-01 VITALS — BP 126/84 | HR 63 | Temp 97.7°F | Ht 61.0 in | Wt 98.2 lb

## 2019-06-01 DIAGNOSIS — E785 Hyperlipidemia, unspecified: Secondary | ICD-10-CM | POA: Diagnosis not present

## 2019-06-01 DIAGNOSIS — M810 Age-related osteoporosis without current pathological fracture: Secondary | ICD-10-CM

## 2019-06-01 DIAGNOSIS — D509 Iron deficiency anemia, unspecified: Secondary | ICD-10-CM | POA: Diagnosis not present

## 2019-06-01 DIAGNOSIS — I10 Essential (primary) hypertension: Secondary | ICD-10-CM | POA: Diagnosis not present

## 2019-06-01 DIAGNOSIS — H8110 Benign paroxysmal vertigo, unspecified ear: Secondary | ICD-10-CM | POA: Diagnosis not present

## 2019-06-01 DIAGNOSIS — I2510 Atherosclerotic heart disease of native coronary artery without angina pectoris: Secondary | ICD-10-CM

## 2019-06-01 DIAGNOSIS — E1122 Type 2 diabetes mellitus with diabetic chronic kidney disease: Secondary | ICD-10-CM | POA: Diagnosis not present

## 2019-06-01 DIAGNOSIS — R413 Other amnesia: Secondary | ICD-10-CM

## 2019-06-01 DIAGNOSIS — E0590 Thyrotoxicosis, unspecified without thyrotoxic crisis or storm: Secondary | ICD-10-CM

## 2019-06-01 DIAGNOSIS — N183 Chronic kidney disease, stage 3 unspecified: Secondary | ICD-10-CM | POA: Diagnosis not present

## 2019-06-01 NOTE — Progress Notes (Signed)
Location: [place] of Service:  Clinic (12)  Provider:   Code Status: Goals of Care:  Advanced Directives 04/06/2019  Does Patient Have a Medical Advance Directive? No  Type of Advance Directive -  Does patient want to make changes to medical advance directive? -  Copy of [place] in Chart? -  Would patient like information on creating a medical advance directive? -  Pre-existing out of facility DNR order (yellow form or pink MOST form) -     Chief Complaint  Patient presents with  . Medical Management of Chronic Issues    1 month follow up  . Health Maintenance    Foot and eye exam, PCV13    HPI: Patient is a 87 y.o. female seen today for an acute visit for Follow up for her Iron Def Anemia.  Anemia Patient had routine Labs drawn which Showed her Hgb was low at 7.1 with low MCV Her rectal exam was negative No source of Bleeding Was started on Iron and GI Consult was made. She is S/P Colonoscopy 5 years ago  She has Appointment with GI in few weeks.   BPPV Per Neurology. On Meclizine per Dr Jannifer Franklin Doing well. Denies any more falls or Dizziness Has Caregiver who is helping her S/P Fall in 12/20 with Laceration Thought to be Mechanical Fall Doing well since then  CT scan showed Right Parietal Hematoma with Chronic Microvascular Angiopathy Hypertension On Number of Meds H/O Renal And Carotid Artery Stenosis  CAD Is followed by Dr Salvadore Oxford showed AS and AI EF preserved BNP elevated  Started on Lasix Low dose  Patient is retired Marine scientist. Lives with her husband who said she has some Short tem Memory issues. Now using  Walker to ambulate Past Medical History:  Diagnosis Date  . BPPV (benign paroxysmal positional vertigo) 01/08/2016  . Bradycardia    a. Coreg decreased due to HR low 40s on event monitor 2015/07/11 -> further decreased due to HR upper 30s in 01/2016.  . Breast nodule 02/15/2019  . CAD (coronary artery disease)    a. RV infarct 07/10/2005 c/b high grade AV block with total occlusion of RCA, unable to treat with PCI. b. Nuc 10/2015: scar but no ischemia.  . Carotid artery disease ([place])   . Chronic anemia   . Chronic lower back pain   . CKD (chronic kidney disease), stage III   . Complication of anesthesia    "almost died when I had my breast reduction"  . Diabetes ([place]) 07/10/2000  . Dyslipidemia   . GERD (gastroesophageal reflux disease)   . Heart murmur   . High cholesterol   . History of hiatal hernia   . HTN (hypertension)   . Hyperthyroidism   . Memory problem 02/15/2019  . Meniere disease    "for years" takes Antivert 3 times per day (01/31/2016)  . Myocardial infarction [facility]) 07-10-2005   "almost died"  . Near syncope   . Osteoporosis   . Presbycusis of both ears 01/29/2016  . Renal artery stenosis ([facility])    a. left renal artery stent 2010-07-11. b. s/p repeat left renal stenting 01/2016.  . Stroke [facility])    a. Prior infarcts seen on head CT.  . TMJ pain dysfunction syndrome 08/26/2016  . Type II diabetes mellitus ([place])    "controlled w/diet and exercise only" (01/31/2016)  . Weakness 02/26/2015    Past Surgical History:  Procedure Laterality Date  .  ANTERIOR CERVICAL DECOMP/DISCECTOMY FUSION  02/2002   Archie Endo 08/26/2010  . APPENDECTOMY    . BACK SURGERY    . BLADDER SUSPENSION  x3  . BREAST LUMPECTOMY  11/2007   breast needle-localized lumpectomy/notes 08/12/2012  . CARDIAC CATHETERIZATION  2007   "couldn't get stents in; I almost died'  . CATARACT EXTRACTION W/ INTRAOCULAR LENS  IMPLANT, BILATERAL Bilateral   . CATARACT EXTRACTION W/ INTRAOCULAR LENS  IMPLANT, BILATERAL Bilateral   . CHOLECYSTECTOMY OPEN    . FRACTURE SURGERY    . HIP FRACTURE SURGERY Right 2011   "added rod in my leg and a ball"  . KYPHOPLASTY N/A 03/20/2015   Procedure: THORACIC TWELVE KYPHOPLASTY;  Surgeon: Jovita Gamma, MD;  Location: [place] NEURO ORS;  Service: Neurosurgery;  Laterality: N/A;  . KYPHOPLASTY N/A 04/23/2015    Procedure: KYPHOPLASTY - LUMBAR ONE;  Surgeon: Jovita Gamma, MD;  Location: [place] NEURO ORS;  Service: Neurosurgery;  Laterality: N/A;  L1 Kyphoplasty  . NECK HARDWARE REMOVAL  11/2002   "replaced screw"  . PERIPHERAL VASCULAR CATHETERIZATION Left 12/20/2014   Procedure: Renal Angiography;  Surgeon: Wellington Hampshire, MD;  Location: [place] CV LAB;  Service: Cardiovascular;  Laterality: Left;  . PERIPHERAL VASCULAR CATHETERIZATION N/A 01/31/2016   Procedure: Renal Angiography;  Surgeon: Lorretta Harp, MD;  Location: [place] CV LAB;  Service: Cardiovascular;  Laterality: N/A;  . PERIPHERAL VASCULAR CATHETERIZATION N/A 01/31/2016   Procedure: Abdominal Aortogram;  Surgeon: Lorretta Harp, MD;  Location: [place] CV LAB;  Service: Cardiovascular;  Laterality: N/A;  . PERIPHERAL VASCULAR CATHETERIZATION  01/31/2016   Procedure: Peripheral Vascular Intervention;  Surgeon: Lorretta Harp, MD;  Location: [place] CV LAB;  Service: Cardiovascular;;  . REDUCTION MAMMAPLASTY Bilateral 1985  . renal arteriogram  12/2014   occluded previous Lt renal stent and patent rt renal artery.  [name] RENAL ARTERY STENT Left 2012  . TONSILLECTOMY    . TUBAL LIGATION    . VAGINAL HYSTERECTOMY  ~ 1995  . WRIST FRACTURE SURGERY Left 2012    Allergies  Allergen Reactions  . Fentanyl Anaphylaxis    Cardiac arrest and coded  . Fish Allergy Anaphylaxis and Rash  . Morphine And Related Anaphylaxis    Cardiac arrest and coded Per Dr. Conley Canal patient has taken percocet without issue  . Singulair [Montelukast] Palpitations and Other (See Comments)    Increased BP and HR  . Actonel [Risedronate Sodium] Rash and Other (See Comments)    weakness  . Dilaudid [Hydromorphone Hcl] Rash and Other (See Comments)    Crying and screaming  . Fosamax [Alendronate Sodium] Rash and Other (See Comments)    Weakness and myalgias  . Hydralazine Diarrhea and Other (See Comments)    Swelling in stomach  . Lipitor  [Atorvastatin] Swelling and Rash    Tongue swelling   . Lisinopril Diarrhea, Rash and Cough  . Micardis [Telmisartan] Diarrhea and Rash  . Septra [Sulfamethoxazole-Trimethoprim] Nausea And Vomiting and Rash  . Tekturna [Aliskiren] Swelling and Rash    Swelling of tongue  . Tricor [Fenofibrate] Rash and Other (See Comments)    Flu symptoms  . Imdur [Isosorbide Dinitrate] Other (See Comments)    Headaches and blindness  . Amlodipine Diarrhea  . Avapro [Irbesartan] Rash  . Cardio Complete [Nutritional Supplements] Rash  . Cardizem [Diltiazem Hcl] Rash  . Ciprofloxacin Rash and Other (See Comments)    Black spot on body  . Codeine Rash  . Cyclobenzaprine Nausea Only  .  Forteo [Parathyroid Hormone (Recomb)] Rash  . Inapsine [Droperidol] Rash  . Ivp Dye [Iodinated Diagnostic Agents] Rash  . Shellfish Allergy Rash  . Tussionex Pennkinetic Er [Hydrocod Polst-Cpm Polst Er] Itching and Rash    Outpatient Encounter Medications as of 06/01/2019  Medication Sig  . acetaminophen (TYLENOL) 500 MG tablet Take 1,000 mg by mouth every 8 (eight) hours as needed for mild pain (for pain.).   [name] aspirin 81 MG tablet Take 1 tablet (81 mg total) by mouth daily.  [name] azelastine (ASTELIN) 0.1 % nasal spray Place 1-2 sprays into both nostrils daily as needed. For allergies/congestion.  . betamethasone dipropionate (DIPROLENE) 0.05 % cream Apply 1 application topically daily as needed (Skin care).   . cholecalciferol (VITAMIN D) 1000 UNITS tablet Take 2,000 Units by mouth daily.   . cloNIDine (CATAPRES) 0.1 MG tablet TAKE TWO TABLETS BY MOUTH IN THE MORNING, ONE TABLET AT 4 PM AND TWO TABLETS AT 10 PM DAILY  . clopidogrel (PLAVIX) 75 MG tablet Take 1 tablet (75 mg total) by mouth daily. Please make overdue appt for future refills. [phone]. 2nd attempt. (Patient taking differently: Take 75 mg by mouth daily. )  . denosumab (PROLIA) 60 MG/ML SOLN injection Inject 60 mg into the skin every 6 (six) months.  Administer in upper arm, thigh, or abdomen  . docusate sodium (COLACE) 100 MG capsule Take 200 mg by mouth at bedtime as needed for mild constipation.  . ferrous gluconate (FERGON) 240 (27 FE) MG tablet Take 1 tablet (240 mg total) by mouth daily.  . fexofenadine (ALLEGRA) 180 MG tablet Take 180 mg by mouth daily.  . furosemide (LASIX) 20 MG tablet Take 1 tablet (20 mg total) by mouth 3 (three) times a week. Mon, Wed, and Fri only.  . meclizine (ANTIVERT) 25 MG tablet Take 1 tablet (25 mg total) by mouth 3 (three) times daily as needed for dizziness.  . metFORMIN (GLUCOPHAGE-XR) 500 MG 24 hr tablet Take 500 mg by mouth daily with supper.   . methimazole (TAPAZOLE) 5 MG tablet Take 5 mg by mouth daily.  . nitroGLYCERIN (NITROSTAT) 0.4 MG SL tablet Place 0.4 mg under the tongue every 5 (five) minutes as needed for chest pain (3 doses MAX).  Glory Rosebush Delica Lancets [id number] MISC USE AS DIRECTED TO CHECK BLOOD SUGAR DAILY AND AS NEEDED E11.9  . ONETOUCH ULTRA test strip USE AS DIRECTED TO CHECK BLOOD SUGARS DAILY AS NEEDED  . pantoprazole (PROTONIX) 40 MG tablet TAKE 1 TABLET BY MOUTH TWICE A DAY BEFORE A MEAL (Patient taking differently: Take 40 mg by mouth 2 (two) times daily. )  . Polyethyl Glycol-Propyl Glycol (SYSTANE FREE OP) Place 2 drops into both eyes at bedtime.  . simvastatin (ZOCOR) 20 MG tablet Take 20 mg by mouth as directed. Take one tablet every Monday, Wednesday, Friday  . telmisartan (MICARDIS) 40 MG tablet Take 20 mg by mouth 2 (two) times daily.  . [DISCONTINUED] ALPRAZolam (XANAX) 0.25 MG tablet Take 0.125 mg by mouth at bedtime as needed for anxiety or sleep.   No facility-administered encounter medications on file as of 06/01/2019.    Review of Systems:  Review of Systems  Review of Systems  Constitutional: Negative for activity change, appetite change, chills, diaphoresis, fatigue and fever.  HENT: Negative for mouth sores, postnasal drip, rhinorrhea, sinus pain and sore  throat.   Respiratory: Negative for apnea, cough, chest tightness, shortness of breath and wheezing.   Cardiovascular: Negative for chest pain, palpitations and  leg swelling.  Gastrointestinal: Negative for abdominal distention, abdominal pain, constipation, diarrhea, nausea and vomiting.  Genitourinary: Negative for dysuria and frequency.  Musculoskeletal: Negative for arthralgias, joint swelling and myalgias.  Skin: Negative for rash.  Neurological: Negative for  syncope, weakness, light-headedness and numbness.  Psychiatric/Behavioral: Negative for behavioral problems, confusion and sleep disturbance.     Health Maintenance  Topic Date Due  . FOOT EXAM  02/13/1942  . OPHTHALMOLOGY EXAM  02/13/1942  . PNA vac Low Risk Adult (2 of 2 - PCV13) 12/14/2015  . HEMOGLOBIN A1C  08/16/2019  . TETANUS/TDAP  04/05/2029  . INFLUENZA VACCINE  Completed  . DEXA SCAN  Completed    Physical Exam: Vitals:   06/01/19 1352  BP: 126/84  Pulse: 63  Temp: 97.7 F (36.5 C)  SpO2: 97%  Weight: 98 lb 3.2 oz (44.5 kg)  Height: 5\' 1"  (1.549 m)   Body mass index is 18.55 kg/m. Physical Exam  Constitutional: Oriented to person, place, and time. Well-developed and well-nourished.  HENT:  Head: Normocephalic.  Mouth/Throat: Oropharynx is clear and moist.  Eyes: Pupils are equal, round, and reactive to light.  Neck: Neck supple.  Cardiovascular: Normal rate and normal heart sounds.  [place] Present Pulmonary/Chest: Effort normal and breath sounds normal. No respiratory distress. No wheezes. She has no rales.  Abdominal: Soft. Bowel sounds are normal. No distension. There is no tenderness. There is no rebound.  Musculoskeletal: No edema.  Lymphadenopathy: none Neurological: Alert and oriented to person, place, and time.  Stands up with No Issues. No Dizziness. Walk with walker short distance Skin: Skin is warm and dry.  Psychiatric: Normal mood and affect. Behavior is normal. Thought content  normal.    Labs reviewed: Basic Metabolic Panel: Recent Labs    02/15/19 1313 02/15/19 1945 02/16/19 0500 02/21/19 1439 03/08/19 1422 03/25/19 1554 04/29/19 1415  NA   < >  --  128*   < > 136 130* 130*  K   < >  --  3.4*   < > 5.3* 4.5 4.2  CL   < >  --  92*   < > 100 91* 91*  CO2   < >  --  23   < > 25 26 25   GLUCOSE   < >  --  162*   < > 95 216* 198*  BUN   < >  --  13   < > 20 20 18   CREATININE   < >  --  1.05*   < > 1.17* 1.14* 1.22*  CALCIUM   < >  --  9.0   < > 9.7 9.3 9.7  MG  --   --  1.5*  --   --   --   --   PHOS  --   --  4.1  --   --   --   --   TSH  --  2.464  --   --   --   --  3.01   < > = values in this interval not displayed.   Liver Function Tests: Recent Labs    02/15/19 1313 02/16/19 0500 04/29/19 1415  AST 23 19 16   ALT 17 15 12   ALKPHOS 68 67  --   BILITOT 0.9 0.7 0.3  PROT 6.5 5.7* 6.6  ALBUMIN 3.6 3.1*  --    No results for input(s): LIPASE, AMYLASE in the last 8760 hours. No results for input(s): AMMONIA in the last 8760 hours.  CBC: Recent Labs    02/25/19 1339 04/29/19 1415 05/09/19 1408  WBC 8.0 7.4 10.4  NEUTROABS 6.0 5,491 8,372*  HGB 9.2* 7.1* 7.3*  HCT 29.8* 23.1* 24.0*  MCV 75* 77.0* 76.2*  PLT 503* 530* 498*   Lipid Panel: Recent Labs    04/29/19 1415  CHOL 187  HDL 56  LDLCALC 102*  TRIG 175*  CHOLHDL 3.3   Lab Results  Component Value Date   HGBA1C 6.8 (H) 02/16/2019    Procedures since last visit: No results found.  Assessment/Plan  Iron deficiency anemia, unspecified iron deficiency anemia type On iron Has Appointment to follow with GI Repeat CBC  Essential hypertension On Clonidine and Micardis Her Caregiver will follow her BP at home and bring the readings for me  Hyperthyroidism TSH and Free T3 in good Range  Benign paroxysmal positional vertigo,  Doing well on Meclizine  Coronary artery disease On Aspirin, Plavix and statin Follows with Dr Pernell Dupre  Type 2 diabetes mellitus A1C  was ON Metformin  Memory problem Will need MMSE next visit  Hyperlipidemia,  Doing well on Statin LDL 102  Osteoporosis On Prolia Due this March Have been on it since 21016 Dont see DEXA scan Labs/tests ordered:  * No order type specified * Next appt:  Visit date not found

## 2019-06-02 NOTE — Progress Notes (Signed)
Cardiology Office Note:    Date:  06/03/2019   ID:  Earna Coder, DOB 1931/08/01, MRN [id number]  PCP:  [name], MD  Cardiologist:  Sinclair Grooms, MD   Referring MD: Lavone Orn, MD   Chief Complaint  Patient presents with  . Coronary Artery Disease  . Cardiac Valve Problem    Aortic stenosis    History of Present Illness:     [name] is a 86 y.o. female with a hx of  Coronary artery disease 08/01/2005 RCA occl>>PCI not successful, renal artery stenosis, left rrtery Stent x 2 August 02, 2015, Carotid artery dz, Hx of CVA, moderateaortic stenosis, mild zms, hypertension, hyperlipidemia, DM II, CKD III, Meniere's disease, and syncope.    She has had both COVID-19 shots (Moderna).  She has been having dizziness.  She has had episodes of syncope felt secondary to Mnire's disease/vertigo.  Recently diagnosed with mild aortic stenosis based upon echo.  She has not had angina or dyspnea.  She denies orthopnea.  She is in a wheelchair today.  She was brought in by her son.   Past Medical History:  Diagnosis Date  . BPPV (benign paroxysmal positional vertigo) 01/08/2016  . Bradycardia    a. Coreg decreased due to HR low 40s on event monitor 2015-08-02 -> further decreased due to HR upper 30s in 01/2016.  . Breast nodule 02/15/2019  . CAD (coronary artery disease)    a. RV infarct 01-Aug-2005 c/b high grade AV block with total occlusion of RCA, unable to treat with PCI. b. Nuc 10/2015: scar but no ischemia.  . Carotid artery disease ([place])   . Chronic anemia   . Chronic lower back pain   . CKD (chronic kidney disease), stage III   . Complication of anesthesia    "almost died when I had my breast reduction"  . Diabetes ([place]) 08/01/2000  . Dyslipidemia   . GERD (gastroesophageal reflux disease)   . Heart murmur   . High cholesterol   . History of hiatal hernia   . HTN (hypertension)   . Hyperthyroidism   . Memory problem 02/15/2019  . Meniere disease    "for years" takes Antivert 3 times  per day (01/31/2016)  . Myocardial infarction [facility]) 01-Aug-2005   "almost died"  . Near syncope   . Osteoporosis   . Presbycusis of both ears 01/29/2016  . Renal artery stenosis ([facility])    a. left renal artery stent 2010-08-02. b. s/p repeat left renal stenting 01/2016.  . Stroke [facility])    a. Prior infarcts seen on head CT.  . TMJ pain dysfunction syndrome 08/26/2016  . Type II diabetes mellitus ([place])    "controlled w/diet and exercise only" (01/31/2016)  . Weakness 02/26/2015    Past Surgical History:  Procedure Laterality Date  . ANTERIOR CERVICAL DECOMP/DISCECTOMY FUSION  02/2002   Archie Endo 08/26/2010  . APPENDECTOMY    . BACK SURGERY    . BLADDER SUSPENSION  x3  . BREAST LUMPECTOMY  11/2007   breast needle-localized lumpectomy/notes 08/12/2012  . CARDIAC CATHETERIZATION  01-Aug-2005   "couldn't get stents in; I almost died'  . CATARACT EXTRACTION W/ INTRAOCULAR LENS  IMPLANT, BILATERAL Bilateral   . CATARACT EXTRACTION W/ INTRAOCULAR LENS  IMPLANT, BILATERAL Bilateral   . CHOLECYSTECTOMY OPEN    . FRACTURE SURGERY    . HIP FRACTURE SURGERY Right 08/01/09   "added rod in my leg and a ball"  . KYPHOPLASTY N/A 03/20/2015   Procedure: THORACIC  TWELVE KYPHOPLASTY;  Surgeon: Jovita Gamma, MD;  Location: [place] NEURO ORS;  Service: Neurosurgery;  Laterality: N/A;  . KYPHOPLASTY N/A 04/23/2015   Procedure: KYPHOPLASTY - LUMBAR ONE;  Surgeon: Jovita Gamma, MD;  Location: [place] NEURO ORS;  Service: Neurosurgery;  Laterality: N/A;  L1 Kyphoplasty  . NECK HARDWARE REMOVAL  11/2002   "replaced screw"  . PERIPHERAL VASCULAR CATHETERIZATION Left 12/20/2014   Procedure: Renal Angiography;  Surgeon: Wellington Hampshire, MD;  Location: [place] CV LAB;  Service: Cardiovascular;  Laterality: Left;  . PERIPHERAL VASCULAR CATHETERIZATION N/A 01/31/2016   Procedure: Renal Angiography;  Surgeon: Lorretta Harp, MD;  Location: [place] CV LAB;  Service: Cardiovascular;  Laterality: N/A;  . PERIPHERAL VASCULAR CATHETERIZATION  N/A 01/31/2016   Procedure: Abdominal Aortogram;  Surgeon: Lorretta Harp, MD;  Location: [place] CV LAB;  Service: Cardiovascular;  Laterality: N/A;  . PERIPHERAL VASCULAR CATHETERIZATION  01/31/2016   Procedure: Peripheral Vascular Intervention;  Surgeon: Lorretta Harp, MD;  Location: [place] CV LAB;  Service: Cardiovascular;;  . REDUCTION MAMMAPLASTY Bilateral 1985  . renal arteriogram  12/2014   occluded previous Lt renal stent and patent rt renal artery.  [name] RENAL ARTERY STENT Left 2012  . TONSILLECTOMY    . TUBAL LIGATION    . VAGINAL HYSTERECTOMY  ~ 1995  . WRIST FRACTURE SURGERY Left 2012    Current Medications: Current Meds  Medication Sig  . acetaminophen (TYLENOL) 500 MG tablet Take 1,000 mg by mouth every 8 (eight) hours as needed for mild pain (for pain.).   [name] aspirin 81 MG tablet Take 1 tablet (81 mg total) by mouth daily.  [name] azelastine (ASTELIN) 0.1 % nasal spray Place 1-2 sprays into both nostrils daily as needed. For allergies/congestion.  . betamethasone dipropionate (DIPROLENE) 0.05 % cream Apply 1 application topically daily as needed (Skin care).   . cholecalciferol (VITAMIN D) 1000 UNITS tablet Take 2,000 Units by mouth daily.   . cloNIDine (CATAPRES) 0.1 MG tablet TAKE TWO TABLETS BY MOUTH IN THE MORNING, ONE TABLET AT 4 PM AND TWO TABLETS AT 10 PM DAILY  . clopidogrel (PLAVIX) 75 MG tablet Take 1 tablet (75 mg total) by mouth daily. Please make overdue appt for future refills. [phone]. 2nd attempt.  . denosumab (PROLIA) 60 MG/ML SOLN injection Inject 60 mg into the skin every 6 (six) months. Administer in upper arm, thigh, or abdomen  . docusate sodium (COLACE) 100 MG capsule Take 200 mg by mouth at bedtime as needed for mild constipation.  . ferrous gluconate (FERGON) 240 (27 FE) MG tablet Take 1 tablet (240 mg total) by mouth daily.  . fexofenadine (ALLEGRA) 180 MG tablet Take 180 mg by mouth daily.  . furosemide (LASIX) 20 MG tablet Take 1 tablet  (20 mg total) by mouth 3 (three) times a week. Mon, Wed, and Fri only.  . meclizine (ANTIVERT) 25 MG tablet Take 1 tablet (25 mg total) by mouth 3 (three) times daily as needed for dizziness.  . metFORMIN (GLUCOPHAGE-XR) 500 MG 24 hr tablet Take 500 mg by mouth daily with supper.   . methimazole (TAPAZOLE) 5 MG tablet Take 5 mg by mouth daily.  . nitroGLYCERIN (NITROSTAT) 0.4 MG SL tablet Place 0.4 mg under the tongue every 5 (five) minutes as needed for chest pain (3 doses MAX).  Glory Rosebush Delica Lancets [id number] MISC USE AS DIRECTED TO CHECK BLOOD SUGAR DAILY AND AS NEEDED E11.9  . ONETOUCH ULTRA test strip USE AS DIRECTED  TO CHECK BLOOD SUGARS DAILY AS NEEDED  . pantoprazole (PROTONIX) 40 MG tablet TAKE 1 TABLET BY MOUTH TWICE A DAY BEFORE A MEAL  . Polyethyl Glycol-Propyl Glycol (SYSTANE FREE OP) Place 2 drops into both eyes at bedtime.  . simvastatin (ZOCOR) 20 MG tablet Take 20 mg by mouth as directed. Take one tablet every Monday, Wednesday, Friday  . telmisartan (MICARDIS) 40 MG tablet Take 20 mg by mouth 2 (two) times daily.     Allergies:   Fentanyl, Fish allergy, Morphine and related, Singulair [montelukast], Actonel [risedronate sodium], Dilaudid [hydromorphone hcl], Fosamax [alendronate sodium], Hydralazine, Lipitor [atorvastatin], Lisinopril, Micardis [telmisartan], Septra [sulfamethoxazole-trimethoprim], Tekturna [aliskiren], Tricor [fenofibrate], Imdur [isosorbide dinitrate], Amlodipine, Avapro [irbesartan], Cardio complete [nutritional supplements], Cardizem [diltiazem hcl], Ciprofloxacin, Codeine, Cyclobenzaprine, Forteo [parathyroid hormone (recomb)], Inapsine [droperidol], Ivp dye [iodinated diagnostic agents], Shellfish allergy, and Tussionex pennkinetic er [hydrocod polst-cpm polst er]   Social History   Socioeconomic History  . Marital status: Married    Spouse name: Not on file  . Number of children: 3  . Years of education: college  . Highest education level: Not on file    Occupational History  . Occupation: Retired Therapist, sports  Tobacco Use  . Smoking status: Former Smoker    Packs/day: 1.00    Years: 30.00    Pack years: 30.00    Types: Cigarettes  . Smokeless tobacco: Never Used  . Tobacco comment: "quit smoking in 1989"  Substance and Sexual Activity  . Alcohol use: No    Alcohol/week: 0.0 standard drinks  . Drug use: No  . Sexual activity: Not on file  Other Topics Concern  . Not on file  Social History Narrative   She and her husband live at [facility].   Right-handed.   No daily caffeine use.   Social Determinants of Health   Financial Resource Strain:   . Difficulty of Paying Living Expenses: Not on file  Food Insecurity:   . Worried About Charity fundraiser in the Last Year: Not on file  . Ran Out of Food in the Last Year: Not on file  Transportation Needs:   . Lack of Transportation (Medical): Not on file  . Lack of Transportation (Non-Medical): Not on file  Physical Activity:   . Days of Exercise per Week: Not on file  . Minutes of Exercise per Session: Not on file  Stress:   . Feeling of Stress : Not on file  Social Connections:   . Frequency of Communication with Friends and Family: Not on file  . Frequency of Social Gatherings with Friends and Family: Not on file  . Attends Religious Services: Not on file  . Active Member of Clubs or Organizations: Not on file  . Attends Archivist Meetings: Not on file  . Marital Status: Not on file     Family History: The patient's family history includes Alzheimer's disease in her sister; Congestive Heart Failure in her father; Dementia in her brother; Diabetes type II in her brother, mother, sister, and sister; Healthy in her sister and sister; Heart attack in her mother and sister; Heart disease in her brother, mother, and sister; Kidney failure in her sister and sister; Other in her brother; Pulmonary disease in her sister; Pulmonary embolism in her father; Stroke in her  brother, brother, and mother.  ROS:   Please see the history of present illness.    Frail.  Lives at [facility].  She denies lower extremity swelling.  Appetite  is been stable.  She has had transient ischemic attacks.  We discussed management which includes dual antiplatelet therapy and statin therapy.  All other systems reviewed and are negative.  EKGs/Labs/Other Studies Reviewed:    The following studies were reviewed today:  2D Doppler echocardiogram November 2020: IMPRESSIONS    1. Left ventricular ejection fraction, by visual estimation, is 55 to  60%. The left ventricle has normal function. Left ventricular septal wall  thickness was mildly increased. Mildly increased left ventricular  posterior wall thickness. There is no left  ventricular hypertrophy.  2. Elevated left ventricular end-diastolic pressure.  3. Left ventricular diastolic parameters are consistent with Grade I  diastolic dysfunction (impaired relaxation).  4. Global right ventricle has normal systolic function.The right  ventricular size is normal. No increase in right ventricular wall  thickness.  5. Left atrial size was mildly dilated.  6. Right atrial size was normal.  7. Mild calcification of the anterior mitral valve leaflet(s). Mild  thickening of the anterior mitral valve leaflet(s). Severe mitral annular  calcification. Mild mitral valve regurgitation. Mild mitral stenosis. The  mean MV gradient is 70mmHg.  8. The tricuspid valve is normal in structure. Tricuspid valve  regurgitation is trivial.  9. The aortic valve is tricuspid. There is Severe calcifcation of the  aortic valve. There is Severely thickening of the aortic valve. Aortic  valve regurgitation is moderate. Moderate aortic valve stenosis. Aortic  regurgitation PHT measures 305 msec.  Aortic valve mean gradient measures 20 mmHg. Aortic valve peak gradient  measures 30.2 mmHg. Aortic valve area, by VTI measures 1.30 cm.    10. The pulmonic valve was normal in structure. Pulmonic valve  regurgitation is trivial.  11. Normal pulmonary artery systolic pressure.  12. The inferior vena cava is normal in size with greater than 50%  respiratory variability, suggesting right atrial pressure of 3 mmHg.  EKG:  EKG not performed on today's visit  Recent Labs: 02/15/2019: B Natriuretic Peptide 1,115.9 02/16/2019: Magnesium 1.5 03/25/2019: NT-Pro BNP 3,715 04/29/2019: ALT 12; BUN 18; Creat 1.22; Potassium 4.2; Sodium 130; TSH 3.01 05/09/2019: Hemoglobin 7.3; Platelets 498  Recent Lipid Panel    Component Value Date/Time   CHOL 187 04/29/2019 1415   TRIG 175 (H) 04/29/2019 1415   HDL 56 04/29/2019 1415   CHOLHDL 3.3 04/29/2019 1415   VLDL UNABLE TO CALCULATE IF TRIGLYCERIDE OVER 400 mg/dL 05/10/2008 0635   LDLCALC 102 (H) 04/29/2019 1415    Physical Exam:    VS:  BP 136/68   Pulse 70   Ht 5\' 1"  (1.549 m)   Wt 99 lb 6.4 oz (45.1 kg)   SpO2 98%   BMI 18.78 kg/m     Wt Readings from Last 3 Encounters:  06/03/19 99 lb 6.4 oz (45.1 kg)  06/01/19 98 lb 3.2 oz (44.5 kg)  05/04/19 99 lb 6.4 oz (45.1 kg)     GEN: Frail and in a wheelchair. No acute distress HEENT: Normal NECK: No JVD. LYMPHATICS: No lymphadenopathy CARDIAC: 3/6 crescendo decrescendo right upper sternal systolic murmur of aortic stenosis RRR without murmur, gallop, or edema. VASCULAR:  Normal Pulses. No bruits. RESPIRATORY:  Clear to auscultation without rales, wheezing or rhonchi  ABDOMEN: Soft, non-tender, non-distended, No pulsatile mass, MUSCULOSKELETAL: No deformity  SKIN: Warm and dry NEUROLOGIC:  Alert and oriented x 3 PSYCHIATRIC:  Normal affect   ASSESSMENT:    1. Coronary artery disease involving native coronary artery of native heart without angina pectoris   2.  Chronic diastolic CHF (congestive heart failure) ([place])   3. Essential hypertension   4. Aortic valve stenosis   5. Bilateral carotid artery stenosis   6. RAS (renal  artery stenosis) ([place])   7. Educated about COVID-19 virus infection    PLAN:    In order of problems listed above:  1. Stable clinically.  We discussed secondary prevention.  She is on statin therapy.  Blood pressure is better controlled. 2. No evidence of volume overload 3. Relatively low blood pressure, possibly related to aortic stenosis. 4. Has moderate aortic stenosis by echocardiogram done in November.  Plan repeat echocardiogram November 2021 or before if suspicious symptoms. 5. Bilateral transmitted carotid bruits. 6. No significant of uncontrolled hypertension. 7. 2 doses of COVID-19 vaccine have been received.  She is still practicing social distancing and masking.   Medication Adjustments/Labs and Tests Ordered: Current medicines are reviewed at length with the patient today.  Concerns regarding medicines are outlined above.  No orders of the defined types were placed in this encounter.  No orders of the defined types were placed in this encounter.   Patient Instructions  Medication Instructions:  Your physician recommends that you continue on your current medications as directed. Please refer to the Current Medication list given to you today.  *If you need a refill on your cardiac medications before your next appointment, please call your pharmacy*  Lab Work: None  If you have labs (blood work) drawn today and your tests are completely normal, you will receive your results only by: [name] MyChart Message (if you have MyChart) OR . A paper copy in the mail If you have any lab test that is abnormal or we need to change your treatment, we will call you to review the results.  Testing/Procedures: None  Follow-Up: At [facility], you and your health needs are our priority.  As part of our continuing mission to provide you with exceptional heart care, we have created designated Provider Care Teams.  These Care Teams include your primary Cardiologist (physician) and  Advanced Practice Providers (APPs -  Physician Assistants and Nurse Practitioners) who all work together to provide you with the care you need, when you need it.  Your next appointment:   9 month(s)  The format for your next appointment:   In Person  Provider:   You may see Sinclair Grooms, MD or one of the following Advanced Practice Providers on your designated Care Team:    Truitt Merle, NP  Cecilie Kicks, NP  Kathyrn Drown, NP   Other Instructions      Signed, Sinclair Grooms, MD  06/03/2019 3:38 PM    [place]

## 2019-06-03 ENCOUNTER — Encounter: Payer: Self-pay | Admitting: Interventional Cardiology

## 2019-06-03 ENCOUNTER — Ambulatory Visit: Payer: PPO | Admitting: Interventional Cardiology

## 2019-06-03 ENCOUNTER — Other Ambulatory Visit: Payer: Self-pay

## 2019-06-03 VITALS — BP 136/68 | HR 70 | Ht 61.0 in | Wt 99.4 lb

## 2019-06-03 DIAGNOSIS — Z7189 Other specified counseling: Secondary | ICD-10-CM

## 2019-06-03 DIAGNOSIS — I6523 Occlusion and stenosis of bilateral carotid arteries: Secondary | ICD-10-CM | POA: Diagnosis not present

## 2019-06-03 DIAGNOSIS — I350 Nonrheumatic aortic (valve) stenosis: Secondary | ICD-10-CM | POA: Diagnosis not present

## 2019-06-03 DIAGNOSIS — I110 Hypertensive heart disease with heart failure: Secondary | ICD-10-CM | POA: Diagnosis not present

## 2019-06-03 DIAGNOSIS — I10 Essential (primary) hypertension: Secondary | ICD-10-CM

## 2019-06-03 DIAGNOSIS — I5032 Chronic diastolic (congestive) heart failure: Secondary | ICD-10-CM

## 2019-06-03 DIAGNOSIS — I701 Atherosclerosis of renal artery: Secondary | ICD-10-CM

## 2019-06-03 DIAGNOSIS — I2510 Atherosclerotic heart disease of native coronary artery without angina pectoris: Secondary | ICD-10-CM

## 2019-06-03 NOTE — Patient Instructions (Addendum)
Medication Instructions:  Your physician recommends that you continue on your current medications as directed. Please refer to the Current Medication list given to you today.  *If you need a refill on your cardiac medications before your next appointment, please call your pharmacy*  Lab Work: None If you have labs (blood work) drawn today and your tests are completely normal, you will receive your results only by: . MyChart Message (if you have MyChart) OR . A paper copy in the mail If you have any lab test that is abnormal or we need to change your treatment, we will call you to review the results.  Testing/Procedures: None  Follow-Up: At [facility], you and your health needs are our priority.  As part of our continuing mission to provide you with exceptional heart care, we have created designated Provider Care Teams.  These Care Teams include your primary Cardiologist (physician) and Advanced Practice Providers (APPs -  Physician Assistants and Nurse Practitioners) who all work together to provide you with the care you need, when you need it.  Your next appointment:   9 month(s)  The format for your next appointment:   In Person  Provider:   You may see Henry W Smith III, MD or one of the following Advanced Practice Providers on your designated Care Team:    Lori Gerhardt, NP  Laura Ingold, NP  Jill McDaniel, NP   Other Instructions   

## 2019-06-06 ENCOUNTER — Other Ambulatory Visit: Payer: Self-pay

## 2019-06-06 ENCOUNTER — Encounter: Payer: PPO | Admitting: *Deleted

## 2019-06-06 ENCOUNTER — Telehealth: Payer: Self-pay | Admitting: *Deleted

## 2019-06-06 DIAGNOSIS — D509 Iron deficiency anemia, unspecified: Secondary | ICD-10-CM

## 2019-06-06 DIAGNOSIS — I10 Essential (primary) hypertension: Secondary | ICD-10-CM

## 2019-06-06 LAB — CBC WITH DIFFERENTIAL/PLATELET
Absolute Monocytes: 370 cells/uL (ref 200–950)
Basophils Absolute: 22 cells/uL (ref 0–200)
Basophils Relative: 0.4 %
Eosinophils Absolute: 112 cells/uL (ref 15–500)
Eosinophils Relative: 2 %
HCT: 26.7 % — ABNORMAL LOW (ref 35.0–45.0)
Hemoglobin: 8.2 g/dL — ABNORMAL LOW (ref 11.7–15.5)
Lymphs Abs: 1529 cells/uL (ref 850–3900)
MCH: 23.8 pg — ABNORMAL LOW (ref 27.0–33.0)
MCHC: 30.7 g/dL — ABNORMAL LOW (ref 32.0–36.0)
MCV: 77.4 fL — ABNORMAL LOW (ref 80.0–100.0)
MPV: 9.7 fL (ref 7.5–12.5)
Monocytes Relative: 6.6 %
Neutro Abs: 3567 cells/uL (ref 1500–7800)
Neutrophils Relative %: 63.7 %
Platelets: 406 10*3/uL — ABNORMAL HIGH (ref 140–400)
RBC: 3.45 10*6/uL — ABNORMAL LOW (ref 3.80–5.10)
RDW: 17 % — ABNORMAL HIGH (ref 11.0–15.0)
Total Lymphocyte: 27.3 %
WBC: 5.6 10*3/uL (ref 3.8–10.8)

## 2019-06-06 NOTE — Progress Notes (Signed)
A user error has taken place: encounter opened in error, closed for administrative reasons.

## 2019-06-06 NOTE — Telephone Encounter (Signed)
Patient and daughter came in today for repeat labs per Dr. Lyndel Safe, no order was in for CBC, I looked in last note 06/01/19 and placed order for CBC. Please advise

## 2019-06-09 ENCOUNTER — Telehealth: Payer: Self-pay | Admitting: Internal Medicine

## 2019-06-09 NOTE — Telephone Encounter (Signed)
Pt husband(Walter) called to say that wife was sch 06/15/19 for Prolia. He noticed that the appt was cx & no one called to say why?  He wants it resch & to call him Friday  Thanks, Lattie Haw

## 2019-06-10 NOTE — Telephone Encounter (Signed)
Patient's husband called back stating Dr. Laurann Montana has been doing her injections and he wants it done wherever it will be covered. I advised him it would be best to come to office instead of facility. Thayer Jew agreed. I advised him we will schedule in office once it is authorized.

## 2019-06-10 NOTE — Telephone Encounter (Signed)
Per, Lyndel Safe, because the patient was seen in clinic on 06/01/19 she didn't need to March apnt in clinic.  Called patient to find out where she has been getting injections because we are unable to do it in clinic. No answer. LMOM to return call with some clarity.   LMOM with facility nurse Rebbeca Paul to return call to confirm whether or not she is administering them. Unable to confirm in matrix.

## 2019-06-13 ENCOUNTER — Other Ambulatory Visit: Payer: Self-pay

## 2019-06-13 ENCOUNTER — Ambulatory Visit: Payer: PPO

## 2019-06-13 DIAGNOSIS — M810 Age-related osteoporosis without current pathological fracture: Secondary | ICD-10-CM

## 2019-06-13 MED ORDER — DENOSUMAB 60 MG/ML ~~LOC~~ SOSY
60.0000 mg | PREFILLED_SYRINGE | Freq: Once | SUBCUTANEOUS | Status: AC
  Administered 2019-06-13: 60 mg via SUBCUTANEOUS

## 2019-06-15 ENCOUNTER — Encounter: Payer: PPO | Admitting: Internal Medicine

## 2019-06-27 DIAGNOSIS — Z8679 Personal history of other diseases of the circulatory system: Secondary | ICD-10-CM | POA: Diagnosis not present

## 2019-06-27 DIAGNOSIS — D500 Iron deficiency anemia secondary to blood loss (chronic): Secondary | ICD-10-CM | POA: Diagnosis not present

## 2019-06-27 DIAGNOSIS — Z87898 Personal history of other specified conditions: Secondary | ICD-10-CM | POA: Diagnosis not present

## 2019-06-27 DIAGNOSIS — K921 Melena: Secondary | ICD-10-CM | POA: Diagnosis not present

## 2019-06-29 ENCOUNTER — Telehealth: Payer: Self-pay | Admitting: *Deleted

## 2019-06-29 NOTE — Telephone Encounter (Signed)
   Primary Cardiologist: Sinclair Grooms, MD  Chart reviewed as part of pre-operative protocol coverage. Hx of  Coronary artery disease 2007 RCA occl>>PCI not successful, renal artery stenosis, left rrtery Stent x 2 2017, Carotid artery dz, Hx of CVA, moderateaortic stenosis, mild zms, hypertension, hyperlipidemia, DM II, CKD III, Meniere's disease, and syncope.  The patient was doing well when last seen by Dr. Tamala Julian 06/03/19.  Given past medical history and time since last visit, based on ACC/AHA guidelines, [name] would be at acceptable risk for the planned procedure without further cardiovascular testing.   Dr. Tamala Julian, can patient hold plavix? Please forward your response to [facility] PREOP.   Thank you   Leanor Kail, PA 06/29/2019, 1:20 PM

## 2019-06-29 NOTE — Telephone Encounter (Signed)
   [facility] Pre-operative Risk Assessment    Request for surgical clearance: PER DR. Alessandra Bevels HE IS ASKING FOR CLEARANCE ON HOLDING PLAVIX AS WELL AS CARDIAC CLEARANCE   1. What type of surgery is being performed? ENDOSCOPY   2. When is this surgery scheduled? TBD SOMETIME IN April @ [facility]   3. What type of clearance is required (medical clearance vs. Pharmacy clearance to hold med vs. Both)? MEDICAL  4. Are there any medications that need to be held prior to surgery and how long? PLAVIX X 5 DAYS PRIOR   5. Practice name and name of physician performing surgery? [facility]; DR. Alessandra Bevels   6. What is your office phone number [phone]    7.   What is your office fax number [phone]  8.   Anesthesia type (None, local, MAC, general) ? PROPOFOL   Julaine Hua 06/29/2019, 10:40 AM  _________________________________________________________________   (provider comments below)

## 2019-06-29 NOTE — Telephone Encounter (Signed)
okay

## 2019-06-29 NOTE — Telephone Encounter (Signed)
Pkay to hold Plavix.

## 2019-06-30 ENCOUNTER — Other Ambulatory Visit: Payer: Self-pay | Admitting: Gastroenterology

## 2019-07-08 ENCOUNTER — Other Ambulatory Visit: Payer: Self-pay | Admitting: *Deleted

## 2019-07-08 MED ORDER — ONETOUCH DELICA LANCETS 33G MISC: 3 refills | Status: DC

## 2019-07-08 MED ORDER — ONETOUCH ULTRA VI STRP: ORAL_STRIP | 3 refills | Status: DC

## 2019-07-08 NOTE — Telephone Encounter (Signed)
Patient husband requested testing supplies.

## 2019-07-16 ENCOUNTER — Other Ambulatory Visit (HOSPITAL_COMMUNITY)
Admission: RE | Admit: 2019-07-16 | Discharge: 2019-07-16 | Disposition: A | Discharge status: Home / Self Care | Payer: PPO | Source: Ambulatory Visit | Attending: Gastroenterology | Admitting: Gastroenterology

## 2019-07-16 DIAGNOSIS — Z20822 Contact with and (suspected) exposure to covid-19: Secondary | ICD-10-CM | POA: Diagnosis not present

## 2019-07-16 DIAGNOSIS — Z01812 Encounter for preprocedural laboratory examination: Secondary | ICD-10-CM | POA: Diagnosis not present

## 2019-07-16 LAB — SARS CORONAVIRUS 2 (TAT 6-24 HRS): SARS Coronavirus 2: NEGATIVE

## 2019-07-20 ENCOUNTER — Encounter (HOSPITAL_COMMUNITY)
Admission: RE | Disposition: A | Discharge status: Home / Self Care | Payer: Self-pay | Source: Ambulatory Visit | Attending: Gastroenterology

## 2019-07-20 ENCOUNTER — Ambulatory Visit (HOSPITAL_COMMUNITY)
Admission: RE | Admit: 2019-07-20 | Discharge: 2019-07-20 | Disposition: A | Discharge status: Home / Self Care | Payer: PPO | Source: Ambulatory Visit | Attending: Gastroenterology | Admitting: Gastroenterology

## 2019-07-20 ENCOUNTER — Other Ambulatory Visit: Payer: Self-pay

## 2019-07-20 ENCOUNTER — Encounter (HOSPITAL_COMMUNITY): Payer: Self-pay | Admitting: Gastroenterology

## 2019-07-20 ENCOUNTER — Ambulatory Visit (HOSPITAL_COMMUNITY): Payer: PPO | Admitting: Anesthesiology

## 2019-07-20 DIAGNOSIS — K317 Polyp of stomach and duodenum: Secondary | ICD-10-CM | POA: Insufficient documentation

## 2019-07-20 DIAGNOSIS — K921 Melena: Secondary | ICD-10-CM | POA: Insufficient documentation

## 2019-07-20 DIAGNOSIS — R1013 Epigastric pain: Secondary | ICD-10-CM | POA: Insufficient documentation

## 2019-07-20 DIAGNOSIS — K449 Diaphragmatic hernia without obstruction or gangrene: Secondary | ICD-10-CM | POA: Diagnosis not present

## 2019-07-20 DIAGNOSIS — K3189 Other diseases of stomach and duodenum: Secondary | ICD-10-CM | POA: Diagnosis not present

## 2019-07-20 HISTORY — PX: BIOPSY: SHX5522

## 2019-07-20 HISTORY — PX: ESOPHAGOGASTRODUODENOSCOPY (EGD) WITH PROPOFOL: SHX5813

## 2019-07-20 SURGERY — ESOPHAGOGASTRODUODENOSCOPY (EGD) WITH PROPOFOL
Anesthesia: Monitor Anesthesia Care

## 2019-07-20 MED ORDER — LIDOCAINE HCL (CARDIAC) PF 100 MG/5ML IV SOSY
PREFILLED_SYRINGE | INTRAVENOUS | Status: DC | PRN
  Administered 2019-07-20: 80 mg via INTRAVENOUS

## 2019-07-20 MED ORDER — PROPOFOL 10 MG/ML IV BOLUS
INTRAVENOUS | Status: DC | PRN
  Administered 2019-07-20: 20 mg via INTRAVENOUS

## 2019-07-20 MED ORDER — PANTOPRAZOLE SODIUM 40 MG PO TBEC: 40.0000 mg | DELAYED_RELEASE_TABLET | Freq: Every day | ORAL | 0 refills | Status: DC

## 2019-07-20 MED ORDER — PROPOFOL 500 MG/50ML IV EMUL
INTRAVENOUS | Status: DC | PRN
  Administered 2019-07-20: 100 ug/kg/min via INTRAVENOUS

## 2019-07-20 MED ORDER — SODIUM CHLORIDE 0.9 % IV SOLN: INTRAVENOUS | Status: DC

## 2019-07-20 MED ORDER — LACTATED RINGERS IV SOLN: INTRAVENOUS | Status: DC

## 2019-07-20 SURGICAL SUPPLY — 15 items

## 2019-07-20 NOTE — Op Note (Signed)
[facility] Patient Name: [name] Procedure Date: 07/20/2019 MRN: [id number] Attending MD: Otis Brace , MD Date of Birth: 11/29/1931 CSN: [id number] Age: 86 Admit Type: Outpatient Procedure:                Upper GI endoscopy Indications:              Epigastric abdominal pain, Melena Providers:                Otis Brace, MD, Elmer Ramp. Tilden Dome, RN, Lina Sar, Technician, Stephanie [place] CRNA Referring MD:              Medicines:                Sedation Administered by an Anesthesia Professional Complications:            No immediate complications. Estimated Blood Loss:     Estimated blood loss was minimal. Procedure:                Pre-Anesthesia Assessment:                           - Prior to the procedure, a History and Physical                            was performed, and patient medications and                            allergies were reviewed. The patient's tolerance of                            previous anesthesia was also reviewed. The risks                            and benefits of the procedure and the sedation                            options and risks were discussed with the patient.                            All questions were answered, and informed consent                            was obtained. Prior Anticoagulants: The patient has                            taken Plavix (clopidogrel), last dose was 5 days                            prior to procedure. ASA Grade Assessment: III - A                            patient with severe systemic disease. After  reviewing the risks and benefits, the patient was                            deemed in satisfactory condition to undergo the                            procedure.                           After obtaining informed consent, the endoscope was                            passed under direct vision. Throughout the                             procedure, the patient's blood pressure, pulse, and                            oxygen saturations were monitored continuously. The                            [id number]) Olympus gastroscope was                            introduced through the mouth, and advanced to the                            second part of duodenum. The upper GI endoscopy was                            accomplished without difficulty. The patient                            tolerated the procedure well. Scope In: Scope Out: Findings:      The Z-line was regular and was found 35 cm from the incisors.      A small hiatal hernia was present.      Normal mucosa was found in the gastric body, in the gastric antrum and       in the prepyloric region of the stomach. Biopsies were taken with a cold       forceps for histology.      The cardia and gastric fundus were normal on retroflexion.      Localized mild mucosal changes characterized by congestion, erythema and       granularity were found in the gastric fundus. Biopsies were taken with a       cold forceps for histology.      Multiple small sessile polyps were found in the gastric body. Biopsies       were taken with a cold forceps for histology.      The duodenal bulb, first portion of the duodenum and second portion of       the duodenum were normal. Impression:               - Z-line regular, 35 cm from the incisors.                           -  Small hiatal hernia.                           - Normal mucosa was found in the gastric body, in                            the antrum and in the prepyloric region. Biopsied.                           - Congested, erythematous and granular mucosa in                            the gastric fundus. Biopsied.                           - Multiple gastric polyps. Biopsied.                           - Normal duodenal bulb, first portion of the                            duodenum and second portion of the  duodenum. Moderate Sedation:      Moderate (conscious) sedation was personally administered by an       anesthesia professional. The following parameters were monitored: oxygen       saturation, heart rate, blood pressure, and response to care. Recommendation:           - Patient has a contact number available for                            emergencies. The signs and symptoms of potential                            delayed complications were discussed with the                            patient. Return to normal activities tomorrow.                            Written discharge instructions were provided to the                            patient.                           - Resume previous diet.                           - Continue present medications.                           - Await pathology results.                           - Use Protonix (pantoprazole) 40 mg PO daily for 6  weeks. Procedure Code(s):        --- Professional ---                           [phone], Esophagogastroduodenoscopy, flexible,                            transoral; with biopsy, single or multiple Diagnosis Code(s):        --- Professional ---                           K44.9, Diaphragmatic hernia without obstruction or                            gangrene                           K31.89, Other diseases of stomach and duodenum                           K31.7, Polyp of stomach and duodenum                           R10.13, Epigastric pain                           K92.1, Melena (includes Hematochezia) CPT copyright 2019 American Medical Association. All rights reserved. The codes documented in this report are preliminary and upon coder review may  be revised to meet current compliance requirements. Otis Brace, MD Otis Brace, MD 07/20/2019 2:48:05 PM Number of Addenda: 0

## 2019-07-20 NOTE — Transfer of Care (Signed)
Immediate Anesthesia Transfer of Care Note  Patient: [name]  Procedure(s) Performed: ESOPHAGOGASTRODUODENOSCOPY (EGD) WITH PROPOFOL (N/A ) BIOPSY  Patient Location: PACU and Endoscopy Unit  Anesthesia Type:MAC  Level of Consciousness: awake and drowsy  Airway & Oxygen Therapy: Patient Spontanous Breathing and Patient connected to face mask oxygen  Post-op Assessment: Report given to RN and Post -op Vital signs reviewed and stable  Post vital signs: Reviewed and stable  Last Vitals:  Vitals Value Taken Time  BP    Temp    Pulse    Resp    SpO2      Last Pain:  Vitals:   07/20/19 1255  TempSrc: Oral  PainSc: 0-No pain         Complications: No apparent anesthesia complications

## 2019-07-20 NOTE — Anesthesia Preprocedure Evaluation (Addendum)
Anesthesia Evaluation  Patient identified by MRN, date of birth, ID band Patient awake    Reviewed: Allergy & Precautions, NPO status , Patient's Chart, lab work & pertinent test results  History of Anesthesia Complications (+) PONV and history of anesthetic complications  Airway Mallampati: II  TM Distance: >3 FB Neck ROM: Full    Dental  (+) Implants, Dental Advisory Given   Pulmonary former smoker,    Pulmonary exam normal        Cardiovascular hypertension, Pt. on medications + angina with exertion + CAD, + Past MI and + Peripheral Vascular Disease  Normal cardiovascular exam    IMPRESSIONS     1. Left ventricular ejection fraction, by visual estimation, is 55 to 60%. The left ventricle has normal function. Left ventricular septal wall thickness was mildly increased. Mildly increased left ventricular posterior wall thickness. There is no left  ventricular hypertrophy. 2. Elevated left ventricular end-diastolic pressure. 3. Left ventricular diastolic parameters are consistent with Grade I diastolic dysfunction (impaired relaxation). 4. Global right ventricle has normal systolic function.The right ventricular size is normal. No increase in right ventricular wall thickness. 5. Left atrial size was mildly dilated. 6. Right atrial size was normal. 7. Mild calcification of the anterior mitral valve leaflet(s). Mild thickening of the anterior mitral valve leaflet(s). Severe mitral annular calcification. Mild mitral valve regurgitation. Mild mitral stenosis. The mean MV gradient is 58mHg. 8. The tricuspid valve is normal in structure. Tricuspid valve regurgitation is trivial. 9. The aortic valve is tricuspid. There is Severe calcifcation of the aortic valve. There is Severely thickening of the aortic valve. Aortic valve regurgitation is moderate. Moderate aortic valve stenosis. Aortic regurgitation PHT measures 305 msec.  Aortic valve  mean gradient measures 20 mmHg. Aortic valve peak gradient measures 30.2 mmHg. Aortic valve area, by VTI measures 1.30 cm. 10. The pulmonic valve was normal in structure. Pulmonic valve regurgitation is trivial. 11. Normal pulmonary artery systolic pressure. 12. The inferior vena cava is normal in size with greater than 50%    Neuro/Psych CVA, No Residual Symptoms    GI/Hepatic hiatal hernia, GERD  ,  Endo/Other  diabetesHyperthyroidism   Renal/GU Renal disease     Musculoskeletal   Abdominal   Peds  Hematology   Anesthesia Other Findings   Reproductive/Obstetrics                            Anesthesia Physical  Anesthesia Plan  ASA: III  Anesthesia Plan: MAC   Post-op Pain Management:    Induction:   PONV Risk Score and Plan: 3 and Ondansetron, Propofol infusion and Treatment may vary due to age or medical condition  Airway Management Planned: Natural Airway, Simple Face Mask and Nasal Cannula  Additional Equipment:   Intra-op Plan:   Post-operative Plan:   Informed Consent: I have reviewed the patients History and Physical, chart, labs and discussed the procedure including the risks, benefits and alternatives for the proposed anesthesia with the patient or authorized representative who has indicated his/her understanding and acceptance.     Dental advisory given  Plan Discussed with: Anesthesiologist and CRNA  Anesthesia Plan Comments:        Anesthesia Quick Evaluation

## 2019-07-20 NOTE — Discharge Instructions (Signed)

## 2019-07-20 NOTE — Anesthesia Postprocedure Evaluation (Signed)
Anesthesia Post Note  Patient: [name]  Procedure(s) Performed: ESOPHAGOGASTRODUODENOSCOPY (EGD) WITH PROPOFOL (N/A ) BIOPSY     Patient location during evaluation: Endoscopy Anesthesia Type: MAC Level of consciousness: awake and alert Pain management: pain level controlled Vital Signs Assessment: post-procedure vital signs reviewed and stable Respiratory status: spontaneous breathing, nonlabored ventilation, respiratory function stable and patient connected to nasal cannula oxygen Cardiovascular status: blood pressure returned to baseline and stable Postop Assessment: no apparent nausea or vomiting Anesthetic complications: no    Last Vitals:  Vitals:   07/20/19 1450 07/20/19 1500  BP: (!) 119/55 129/68  Pulse: (!) 58 69  Resp: 19 19  Temp:    SpO2: 100% 94%    Last Pain:  Vitals:   07/20/19 1500  TempSrc:   PainSc: 0-No pain                 Zayli Villafuerte DANIEL

## 2019-07-20 NOTE — H&P (Signed)
   [name] is a 86 y.o. female was seen in the clinic for further evaluation of epigastric pain and melena.  Taking PPI.  Epigastric pain has resolved.  Currently having dark stool but also takes iron supplements.    The various methods of treatment have been discussed with the patient and family. After consideration of risks, benefits and other options for treatment, the patient has consented to  Procedure(s): Esophagoduodenoscopy as a surgical intervention .  The patient's history has been reviewed, patient examined, no change in status, stable for surgery.  I have reviewed the patient's chart and labs.  Questions were answered to the patient's satisfaction.   Holding Plavix for 5 days  Cardiology clearance reviewed  Risks (bleeding, infection, bowel perforation that could require surgery, sedation-related changes in cardiopulmonary systems), benefits (identification and possible treatment of source of symptoms, exclusion of certain causes of symptoms), and alternatives (watchful waiting, radiographic imaging studies, empiric medical treatment)  were explained to patient in detail and patient wishes to proceed.  Otis Brace MD, [place] 07/20/2019, 1:15 PM  Contact #  [phone]

## 2019-07-21 ENCOUNTER — Other Ambulatory Visit: Payer: Self-pay

## 2019-07-21 LAB — SURGICAL PATHOLOGY

## 2019-08-03 ENCOUNTER — Other Ambulatory Visit: Payer: Self-pay

## 2019-08-03 ENCOUNTER — Non-Acute Institutional Stay: Payer: PPO | Admitting: Internal Medicine

## 2019-08-03 ENCOUNTER — Other Ambulatory Visit: Payer: Self-pay | Admitting: Internal Medicine

## 2019-08-03 ENCOUNTER — Encounter: Payer: Self-pay | Admitting: Internal Medicine

## 2019-08-03 VITALS — BP 112/66 | HR 61 | Temp 97.8°F | Ht 60.0 in | Wt 103.8 lb

## 2019-08-03 DIAGNOSIS — I10 Essential (primary) hypertension: Secondary | ICD-10-CM | POA: Diagnosis not present

## 2019-08-03 DIAGNOSIS — I2510 Atherosclerotic heart disease of native coronary artery without angina pectoris: Secondary | ICD-10-CM

## 2019-08-03 DIAGNOSIS — N183 Chronic kidney disease, stage 3 unspecified: Secondary | ICD-10-CM

## 2019-08-03 DIAGNOSIS — H8110 Benign paroxysmal vertigo, unspecified ear: Secondary | ICD-10-CM

## 2019-08-03 DIAGNOSIS — R300 Dysuria: Secondary | ICD-10-CM | POA: Diagnosis not present

## 2019-08-03 DIAGNOSIS — D509 Iron deficiency anemia, unspecified: Secondary | ICD-10-CM

## 2019-08-03 DIAGNOSIS — E0590 Thyrotoxicosis, unspecified without thyrotoxic crisis or storm: Secondary | ICD-10-CM

## 2019-08-03 DIAGNOSIS — E1122 Type 2 diabetes mellitus with diabetic chronic kidney disease: Secondary | ICD-10-CM

## 2019-08-03 DIAGNOSIS — M810 Age-related osteoporosis without current pathological fracture: Secondary | ICD-10-CM | POA: Diagnosis not present

## 2019-08-03 DIAGNOSIS — E785 Hyperlipidemia, unspecified: Secondary | ICD-10-CM | POA: Diagnosis not present

## 2019-08-03 NOTE — Progress Notes (Signed)
Location:  [place] of Service:  Clinic (12)  Provider:   Code Status:  Goals of Care:  Advanced Directives 07/20/2019  Does Patient Have a Medical Advance Directive? Yes  Type of Paramedic of [place];Living will  Does patient want to make changes to medical advance directive? -  Copy of [place] in Chart? No - copy requested  Would patient like information on creating a medical advance directive? -  Pre-existing out of facility DNR order (yellow form or pink MOST form) -     Chief Complaint  Patient presents with  . Medical Management of Chronic Issues    Patient would like to disuss the frequent and burning with urination for the last week.,    HPI: Patient is a 86 y.o. female seen today for medical management of chronic diseases.    Dysuria Today patient is complaining of burning micturition.  She went to the hospital.  EGD and since then has been having dysuria.  No fever or chills No abdominal pain nausea vomiting Has been taking OTC cranberry tablets to help with the pain Iron deficiency anemia Hemoglobin at one point was low at 7.1 with low MCV Rectal exam was negative Was seen by [facility] Had EGD done.  That biopsies are negative.  He did not suggest for colonoscopy at this time Patient stopped her iron  BPPV Diagnosed by neurology Taking half meclizine at night and states having no dizziness.  No falls recently Hypertension On a number of medication has history of renal and carotid artery stenosis CAD Is followed by Dr Salvadore Oxford showed AS and AI EF preserved BNP elevated Started on Lasix Low dose  Patient is retired Marine scientist. Lives with her husband.  Uses walker to ambulate  Past Medical History:  Diagnosis Date  . BPPV (benign paroxysmal positional vertigo) 01/08/2016  . Bradycardia    a. Coreg decreased due to HR low 40s on event monitor 07-30-15 -> further decreased due to HR upper 30s in  01/2016.  . Breast nodule 02/15/2019  . CAD (coronary artery disease)    a. RV infarct 2005-07-29 c/b high grade AV block with total occlusion of RCA, unable to treat with PCI. b. Nuc 10/2015: scar but no ischemia.  . Carotid artery disease ([place])   . Chronic anemia   . Chronic lower back pain   . CKD (chronic kidney disease), stage III   . Complication of anesthesia    "almost died when I had my breast reduction"  . Diabetes ([place]) 2000/07/29  . Dyslipidemia   . GERD (gastroesophageal reflux disease)   . Heart murmur   . High cholesterol   . History of hiatal hernia   . HTN (hypertension)   . Hyperthyroidism   . Memory problem 02/15/2019  . Meniere disease    "for years" takes Antivert 3 times per day (01/31/2016)  . Myocardial infarction [facility]) 07/29/05   "almost died"  . Near syncope   . Osteoporosis   . Presbycusis of both ears 01/29/2016  . Renal artery stenosis ([facility])    a. left renal artery stent 30-Jul-2010. b. s/p repeat left renal stenting 01/2016.  . Stroke [facility])    a. Prior infarcts seen on head CT.  . TMJ pain dysfunction syndrome 08/26/2016  . Type II diabetes mellitus ([place])    "controlled w/diet and exercise only" (01/31/2016)  . Weakness 02/26/2015    Past Surgical History:  Procedure  Laterality Date  . ANTERIOR CERVICAL DECOMP/DISCECTOMY FUSION  02/2002   Archie Endo 08/26/2010  . APPENDECTOMY    . BACK SURGERY    . BIOPSY  07/20/2019   Procedure: BIOPSY;  Surgeon: Otis Brace, MD;  Location: [facility] END[facility];  Service: Gastroenterology;;  . BLADDER SUSPENSION  x3  . BREAST LUMPECTOMY  11/2007   breast needle-localized lumpectomy/notes 08/12/2012  . CARDIAC CATHETERIZATION  2007   "couldn't get stents in; I almost died'  . CATARACT EXTRACTION W/ INTRAOCULAR LENS  IMPLANT, BILATERAL Bilateral   . CATARACT EXTRACTION W/ INTRAOCULAR LENS  IMPLANT, BILATERAL Bilateral   . CHOLECYSTECTOMY OPEN    . ESOPHAGOGASTRODUODENOSCOPY (EGD) WITH PROPOFOL N/A 07/20/2019   Procedure:  ESOPHAGOGASTRODUODENOSCOPY (EGD) WITH PROPOFOL;  Surgeon: Otis Brace, MD;  Location: [facility];  Service: Gastroenterology;  Laterality: N/A;  . FRACTURE SURGERY    . HIP FRACTURE SURGERY Right 2011   "added rod in my leg and a ball"  . KYPHOPLASTY N/A 03/20/2015   Procedure: THORACIC TWELVE KYPHOPLASTY;  Surgeon: Jovita Gamma, MD;  Location: [place] NEURO ORS;  Service: Neurosurgery;  Laterality: N/A;  . KYPHOPLASTY N/A 04/23/2015   Procedure: KYPHOPLASTY - LUMBAR ONE;  Surgeon: Jovita Gamma, MD;  Location: [place] NEURO ORS;  Service: Neurosurgery;  Laterality: N/A;  L1 Kyphoplasty  . NECK HARDWARE REMOVAL  11/2002   "replaced screw"  . PERIPHERAL VASCULAR CATHETERIZATION Left 12/20/2014   Procedure: Renal Angiography;  Surgeon: Wellington Hampshire, MD;  Location: [place] CV LAB;  Service: Cardiovascular;  Laterality: Left;  . PERIPHERAL VASCULAR CATHETERIZATION N/A 01/31/2016   Procedure: Renal Angiography;  Surgeon: Lorretta Harp, MD;  Location: [place] CV LAB;  Service: Cardiovascular;  Laterality: N/A;  . PERIPHERAL VASCULAR CATHETERIZATION N/A 01/31/2016   Procedure: Abdominal Aortogram;  Surgeon: Lorretta Harp, MD;  Location: [place] CV LAB;  Service: Cardiovascular;  Laterality: N/A;  . PERIPHERAL VASCULAR CATHETERIZATION  01/31/2016   Procedure: Peripheral Vascular Intervention;  Surgeon: Lorretta Harp, MD;  Location: [place] CV LAB;  Service: Cardiovascular;;  . REDUCTION MAMMAPLASTY Bilateral 1985  . renal arteriogram  12/2014   occluded previous Lt renal stent and patent rt renal artery.  [name] RENAL ARTERY STENT Left 2012  . TONSILLECTOMY    . TUBAL LIGATION    . VAGINAL HYSTERECTOMY  ~ 1995  . WRIST FRACTURE SURGERY Left 2012    Allergies  Allergen Reactions  . Fentanyl Anaphylaxis    Cardiac arrest and coded  . Fish Allergy Anaphylaxis and Rash  . Morphine And Related Anaphylaxis    Cardiac arrest and coded Per Dr. Conley Canal patient has taken  percocet without issue  . Singulair [Montelukast] Palpitations and Other (See Comments)    Increased BP and HR  . Actonel [Risedronate Sodium] Rash and Other (See Comments)    weakness  . Dilaudid [Hydromorphone Hcl] Rash and Other (See Comments)    Crying and screaming  . Fosamax [Alendronate Sodium] Rash and Other (See Comments)    Weakness and myalgias  . Hydralazine Diarrhea and Other (See Comments)    Swelling in stomach  . Lipitor [Atorvastatin] Swelling and Rash    Tongue swelling   . Lisinopril Diarrhea, Rash and Cough  . Septra [Sulfamethoxazole-Trimethoprim] Nausea And Vomiting and Rash  . Tekturna [Aliskiren] Swelling and Rash    Swelling of tongue  . Tricor [Fenofibrate] Rash and Other (See Comments)    Flu symptoms  . Imdur [Isosorbide Dinitrate] Other (See Comments)    Headaches and blindness  .  Amlodipine Diarrhea  . Avapro [Irbesartan] Rash  . Cardio Complete [Nutritional Supplements] Rash  . Cardizem [Diltiazem Hcl] Rash  . Ciprofloxacin Rash and Other (See Comments)    Black spot on body  . Codeine Rash  . Cyclobenzaprine Nausea Only  . Forteo [Parathyroid Hormone (Recomb)] Rash  . Inapsine [Droperidol] Rash  . Ivp Dye [Iodinated Diagnostic Agents] Rash  . Shellfish Allergy Rash  . Tussionex Pennkinetic Er [Hydrocod Polst-Cpm Polst Er] Itching and Rash    Outpatient Encounter Medications as of 08/03/2019  Medication Sig  . acetaminophen (TYLENOL) 500 MG tablet Take 1,000 mg by mouth every 8 (eight) hours as needed for mild pain (for pain.).   [name] aspirin 81 MG tablet Take 1 tablet (81 mg total) by mouth daily.  [name] azelastine (ASTELIN) 0.1 % nasal spray Place 1-2 sprays into both nostrils daily as needed for rhinitis or allergies.   [name] betamethasone dipropionate (DIPROLENE) 0.05 % cream Apply 1 application topically daily as needed (skin spots).   . cholecalciferol (VITAMIN D) 1000 UNITS tablet Take 2,000 Units by mouth daily.   . cloNIDine (CATAPRES) 0.1 MG  tablet TAKE TWO TABLETS BY MOUTH IN THE MORNING, ONE TABLET AT 4 PM AND TWO TABLETS AT 10 PM DAILY (Patient taking differently: Take 0.1-0.2 mg by mouth See admin instructions. Take 0.2 mg by mouth in the morning, 0.1 mg at 4 PM and 0.2 mg at 10 PM daily)  . clopidogrel (PLAVIX) 75 MG tablet Take 1 tablet (75 mg total) by mouth daily. Please make overdue appt for future refills. [phone]. 2nd attempt.  . denosumab (PROLIA) 60 MG/ML SOLN injection Inject 60 mg into the skin every 6 (six) months. Administer in upper arm, thigh, or abdomen  . docusate sodium (COLACE) 100 MG capsule Take 200 mg by mouth at bedtime as needed for mild constipation.   . fexofenadine (ALLEGRA) 180 MG tablet Take 180 mg by mouth daily.  . meclizine (ANTIVERT) 25 MG tablet Take 1 tablet (25 mg total) by mouth 3 (three) times daily as needed for dizziness.  . metFORMIN (GLUCOPHAGE-XR) 500 MG 24 hr tablet Take 500 mg by mouth daily with supper.   . methimazole (TAPAZOLE) 5 MG tablet Take 5 mg by mouth daily.  . nitroGLYCERIN (NITROSTAT) 0.4 MG SL tablet Place 0.4 mg under the tongue every 5 (five) minutes as needed for chest pain (3 doses MAX).  Glory Rosebush Delica Lancets [id number] MISC Use to test blood sugar twice daily. Dx: E11.9  . ONETOUCH ULTRA test strip Use to test blood sugar twice daily. Dx: E11.9  . pantoprazole (PROTONIX) 40 MG tablet Take 1 tablet (40 mg total) by mouth daily.  Vladimir Faster Glycol-Propyl Glycol (SYSTANE FREE OP) Place 2 drops into both eyes at bedtime as needed (dry eyes).   . simvastatin (ZOCOR) 20 MG tablet Take 20 mg by mouth every Monday, Wednesday, and Friday.   . telmisartan (MICARDIS) 40 MG tablet Take 20 mg by mouth 2 (two) times daily.  . furosemide (LASIX) 20 MG tablet Take 1 tablet (20 mg total) by mouth 3 (three) times a week. Mon, Wed, and Fri only. (Patient taking differently: Take 20 mg by mouth every Monday, Wednesday, and Friday. )  . [DISCONTINUED] ferrous gluconate (FERGON) 240 (27  FE) MG tablet Take 1 tablet (240 mg total) by mouth daily.   No facility-administered encounter medications on file as of 08/03/2019.    Review of Systems:  Review of Systems Review of Systems  Constitutional: Negative  for activity change, appetite change, chills, diaphoresis, fatigue and fever.  HENT: Negative for mouth sores, postnasal drip, rhinorrhea, sinus pain and sore throat.   Respiratory: Negative for apnea, cough, chest tightness, shortness of breath and wheezing.   Cardiovascular: Negative for chest pain, palpitations and leg swelling.  Gastrointestinal: Negative for abdominal distention, abdominal pain, constipation, diarrhea, nausea and vomiting.  Genitourinary: Positive for dysuria and frequency.  Musculoskeletal: Negative for arthralgias, joint swelling and myalgias.  Skin: Negative for rash.  Neurological: Negative for dizziness, syncope, weakness, light-headedness and numbness.  Psychiatric/Behavioral: Negative for behavioral problems, confusion and sleep disturbance.     Health Maintenance  Topic Date Due  . FOOT EXAM  Never done  . OPHTHALMOLOGY EXAM  Never done  . PNA vac Low Risk Adult (2 of 2 - PCV13) 12/14/2015  . HEMOGLOBIN A1C  08/16/2019  . INFLUENZA VACCINE  11/13/2019  . TETANUS/TDAP  04/05/2029  . DEXA SCAN  Completed  . COVID-19 Vaccine  Completed    Physical Exam: Vitals:   08/03/19 1409  BP: 112/66  Pulse: 61  Temp: 97.8 F (36.6 C)  SpO2: 98%  Weight: 103 lb 12.8 oz (47.1 kg)  Height: 5' (1.524 m)   Body mass index is 20.27 kg/m. Physical Exam  Constitutional: Oriented to person, place, and time. Well-developed and well-nourished.  HENT:  Head: Normocephalic.  Mouth/Throat: Oropharynx is clear and moist.  Eyes: Pupils are equal, round, and reactive to light.  Neck: Neck supple.  Cardiovascular: Normal rate and normal heart sounds.  Positive for Murmur. Pulmonary/Chest: Effort normal and breath sounds normal. No respiratory  distress. No wheezes. She has no rales.  Abdominal: Soft. Bowel sounds are normal. No distension. There is no tenderness. There is no rebound.  Musculoskeletal: No edema.  Lymphadenopathy: none Neurological: Alert and oriented to person, place, and time. No Dizziness Skin: Skin is warm and dry.  Psychiatric: Normal mood and affect. Behavior is normal. Thought content normal.    Labs reviewed: Basic Metabolic Panel: Recent Labs    02/15/19 1313 02/15/19 1945 02/16/19 0500 02/21/19 1439 03/08/19 1422 03/25/19 1554 04/29/19 1415  NA   < >  --  128*   < > 136 130* 130*  K   < >  --  3.4*   < > 5.3* 4.5 4.2  CL   < >  --  92*   < > 100 91* 91*  CO2   < >  --  23   < > 25 26 25   GLUCOSE   < >  --  162*   < > 95 216* 198*  BUN   < >  --  13   < > 20 20 18   CREATININE   < >  --  1.05*   < > 1.17* 1.14* 1.22*  CALCIUM   < >  --  9.0   < > 9.7 9.3 9.7  MG  --   --  1.5*  --   --   --   --   PHOS  --   --  4.1  --   --   --   --   TSH  --  2.464  --   --   --   --  3.01   < > = values in this interval not displayed.   Liver Function Tests: Recent Labs    02/15/19 1313 02/16/19 0500 04/29/19 1415  AST 23 19 16   ALT 17 15 12   ALKPHOS 68  67  --   BILITOT 0.9 0.7 0.3  PROT 6.5 5.7* 6.6  ALBUMIN 3.6 3.1*  --    No results for input(s): LIPASE, AMYLASE in the last 8760 hours. No results for input(s): AMMONIA in the last 8760 hours. CBC: Recent Labs    04/29/19 1415 05/09/19 1408 06/06/19 1117  WBC 7.4 10.4 5.6  NEUTROABS 5,491 8,372* 3,567  HGB 7.1* 7.3* 8.2*  HCT 23.1* 24.0* 26.7*  MCV 77.0* 76.2* 77.4*  PLT 530* 498* 406*   Lipid Panel: Recent Labs    04/29/19 1415  CHOL 187  HDL 56  LDLCALC 102*  TRIG 175*  CHOLHDL 3.3   Lab Results  Component Value Date   HGBA1C 6.8 (H) 02/16/2019    Procedures since last visit: No results found.  Assessment/Plan Dysuria UA and Culture Iron deficiency anemia, unspecified iron deficiency anemia type Repeat CBC Has  stopped iron EGD neg for any acute Bleed Follow up with GI Essential hypertension On Clonidine and Micardis Coronary artery disease On Plavix, Statin, Aspirin Follows with Cardiology LE edema with AS and AI On Lasix Benign paroxysmal positional vertigo, unspecified laterality Symptoms Controlled on Meclizine Hyperthyroidism TSH and T3-4 normal Age-related osteoporosis without current pathological fracture On Prolia Needs Repeat DEXA Vit D Level Type 2 diabetes mellitus with stage 3 chronic kidney disease, without long-term current use of insulin, unspecified whether stage 3a or 3b CKD ([place]) Repeat A1C On Metformin Hyperlipidemia, unspecified hyperlipidemia type Repeat Fasting Lipid On Statin Allergic Rhinitis On Allegra, AStelin   Labs/tests ordered:  * No order type specified * Next appt:  08/17/2019 Total time spent in this patient care encounter was  45_  minutes; greater than 50% of the visit spent counseling patient and staff, reviewing records , Labs and coordinating care for problems addressed at this encounter.   Next visit DEXA scan, Foot exam

## 2019-08-05 DIAGNOSIS — N183 Chronic kidney disease, stage 3 unspecified: Secondary | ICD-10-CM | POA: Diagnosis not present

## 2019-08-05 DIAGNOSIS — R300 Dysuria: Secondary | ICD-10-CM | POA: Diagnosis not present

## 2019-08-05 DIAGNOSIS — E1122 Type 2 diabetes mellitus with diabetic chronic kidney disease: Secondary | ICD-10-CM | POA: Diagnosis not present

## 2019-08-07 LAB — URINALYSIS
Bilirubin Urine: NEGATIVE
Glucose, UA: NEGATIVE
Ketones, ur: NEGATIVE
Nitrite: POSITIVE — AB
Specific Gravity, Urine: 1.007 (ref 1.001–1.03)
pH: 6.5 (ref 5.0–8.0)

## 2019-08-07 LAB — URINE CULTURE
MICRO NUMBER:: 10400978
SPECIMEN QUALITY:: ADEQUATE

## 2019-08-07 LAB — MICROALBUMIN, URINE: Microalb, Ur: 34.4 mg/dL

## 2019-08-08 ENCOUNTER — Other Ambulatory Visit: Payer: Self-pay | Admitting: Internal Medicine

## 2019-08-08 MED ORDER — CEFDINIR 300 MG PO CAPS: 300.0000 mg | ORAL_CAPSULE | Freq: Two times a day (BID) | ORAL | 0 refills | Status: AC

## 2019-08-11 ENCOUNTER — Telehealth: Payer: Self-pay | Admitting: *Deleted

## 2019-08-11 NOTE — Telephone Encounter (Signed)
Patient notified and agreed.  

## 2019-08-11 NOTE — Telephone Encounter (Signed)
This is the most strong Antibiotic and Bacteria was sensitive to it. It will work. She has to keep drinking more water.If her symptoms should get better by Next week. If not we have to retest her Urine.

## 2019-08-11 NOTE — Telephone Encounter (Signed)
Patient called and stated that she saw Dr. Lyndel Safe for a UTI and given Omnicef. Patient stated that she has 4 tablets left but she is still burning and stinging. No Relief. Patient thinks medication needs to be changed to something different.  Please Advise.

## 2019-08-15 ENCOUNTER — Telehealth: Payer: Self-pay

## 2019-08-15 NOTE — Telephone Encounter (Signed)
Incoming call received from patient stating that she finished antibiotic yesterday for urinary tract infection and she is still with burning.   Patient is drinking at least 4 bottles of water daily   Please advise

## 2019-08-15 NOTE — Telephone Encounter (Signed)
Can she come tomorrow to see [facility] for Urine Collection ? Then we can send for reculture on Thurs? Or other option is she comes to Office?

## 2019-08-15 NOTE — Telephone Encounter (Signed)
Spoke with patient and family member. Patient will come to office to see Dinah tomorrow at 11 am and get labs following appt.

## 2019-08-16 ENCOUNTER — Ambulatory Visit: Payer: Self-pay | Admitting: Family

## 2019-08-17 ENCOUNTER — Other Ambulatory Visit: Payer: Self-pay

## 2019-08-17 ENCOUNTER — Ambulatory Visit (INDEPENDENT_AMBULATORY_CARE_PROVIDER_SITE_OTHER): Payer: PPO | Admitting: Family

## 2019-08-17 ENCOUNTER — Other Ambulatory Visit: Payer: PPO

## 2019-08-17 ENCOUNTER — Encounter: Payer: Self-pay | Admitting: Family

## 2019-08-17 VITALS — BP 140/84 | HR 88 | Temp 98.9°F | Resp 16 | Ht 60.0 in | Wt 103.4 lb

## 2019-08-17 DIAGNOSIS — D509 Iron deficiency anemia, unspecified: Secondary | ICD-10-CM | POA: Diagnosis not present

## 2019-08-17 DIAGNOSIS — R300 Dysuria: Secondary | ICD-10-CM

## 2019-08-17 DIAGNOSIS — E1122 Type 2 diabetes mellitus with diabetic chronic kidney disease: Secondary | ICD-10-CM | POA: Diagnosis not present

## 2019-08-17 DIAGNOSIS — E785 Hyperlipidemia, unspecified: Secondary | ICD-10-CM | POA: Diagnosis not present

## 2019-08-17 DIAGNOSIS — E0590 Thyrotoxicosis, unspecified without thyrotoxic crisis or storm: Secondary | ICD-10-CM | POA: Diagnosis not present

## 2019-08-17 DIAGNOSIS — N183 Chronic kidney disease, stage 3 unspecified: Secondary | ICD-10-CM | POA: Diagnosis not present

## 2019-08-17 LAB — POCT URINALYSIS DIPSTICK
Bilirubin, UA: NEGATIVE
Blood, UA: POSITIVE
Glucose, UA: NEGATIVE
Ketones, UA: NEGATIVE
Nitrite, UA: NEGATIVE
Protein, UA: POSITIVE — AB
Spec Grav, UA: 1.02 (ref 1.010–1.025)
Urobilinogen, UA: 0.2 E.U./dL
pH, UA: 6 (ref 5.0–8.0)

## 2019-08-17 NOTE — Patient Instructions (Signed)
- urine culture send will call you with results in 3 days  - increase water intake to at least 6-8 glasses daily  - Continue with cranberry tablet one by mouth  twice daily  - Notify provider's office if running for any fever > 100.5 or chills  Urinary Tract Infection, Adult A urinary tract infection (UTI) is an infection of any part of the urinary tract. The urinary tract includes:  The kidneys.  The ureters.  The bladder.  The urethra. These organs make, store, and get rid of pee (urine) in the body. What are the causes? This is caused by germs (bacteria) in your genital area. These germs grow and cause swelling (inflammation) of your urinary tract. What increases the risk? You are more likely to develop this condition if:  You have a small, thin tube (catheter) to drain pee.  You cannot control when you pee or poop (incontinence).  You are female, and: ? You use these methods to prevent pregnancy:  A medicine that kills sperm (spermicide).  A device that blocks sperm (diaphragm). ? You have low levels of a female hormone (estrogen). ? You are pregnant.  You have genes that add to your risk.  You are sexually active.  You take antibiotic medicines.  You have trouble peeing because of: ? A prostate that is bigger than normal, if you are female. ? A blockage in the part of your body that drains pee from the bladder (urethra). ? A kidney stone. ? A nerve condition that affects your bladder (neurogenic bladder). ? Not getting enough to drink. ? Not peeing often enough.  You have other conditions, such as: ? Diabetes. ? A weak disease-fighting system (immune system). ? Sickle cell disease. ? Gout. ? Injury of the spine. What are the signs or symptoms? Symptoms of this condition include:  Needing to pee right away (urgently).  Peeing often.  Peeing small amounts often.  Pain or burning when peeing.  Blood in the pee.  Pee that smells bad or not like  normal.  Trouble peeing.  Pee that is cloudy.  Fluid coming from the vagina, if you are female.  Pain in the belly or lower back. Other symptoms include:  Throwing up (vomiting).  No urge to eat.  Feeling mixed up (confused).  Being tired and grouchy (irritable).  A fever.  Watery poop (diarrhea). How is this treated? This condition may be treated with:  Antibiotic medicine.  Other medicines.  Drinking enough water. Follow these instructions at home:  Medicines  Take over-the-counter and prescription medicines only as told by your doctor.  If you were prescribed an antibiotic medicine, take it as told by your doctor. Do not stop taking it even if you start to feel better. General instructions  Make sure you: ? Pee until your bladder is empty. ? Do not hold pee for a long time. ? Empty your bladder after sex. ? Wipe from front to back after pooping if you are a female. Use each tissue one time when you wipe.  Drink enough fluid to keep your pee pale yellow.  Keep all follow-up visits as told by your doctor. This is important. Contact a doctor if:  You do not get better after 1-2 days.  Your symptoms go away and then come back. Get help right away if:  You have very bad back pain.  You have very bad pain in your lower belly.  You have a fever.  You are sick to your  stomach (nauseous).  You are throwing up. Summary  A urinary tract infection (UTI) is an infection of any part of the urinary tract.  This condition is caused by germs in your genital area.  There are many risk factors for a UTI. These include having a small, thin tube to drain pee and not being able to control when you pee or poop.  Treatment includes antibiotic medicines for germs.  Drink enough fluid to keep your pee pale yellow. This information is not intended to replace advice given to you by your health care provider. Make sure you discuss any questions you have with your  health care provider. Document Revised: 03/18/2018 Document Reviewed: 10/08/2017 Elsevier Patient Education  2020 [employer].

## 2019-08-17 NOTE — Progress Notes (Signed)
Provider: Kire Ferg FNP-C  [name], MD  Patient Care Team: [name], MD as PCP - General (Internal Medicine) Belva Crome, MD as PCP - Cardiology (Cardiology)  Extended Emergency Contact Information Primary Emergency Contact: Finchum,Walter Address: [address]          [place], [place] [address] [place] Phone: [phone] Mobile Phone: [phone] Relation: Spouse Secondary Emergency Contact: Merian Capron, [place] [address] [place] Phone: [phone] Mobile Phone: [phone] Relation: Son  Code Status: Full code  Goals of care: Advanced Directive information Advanced Directives 08/17/2019  Does Patient Have a Medical Advance Directive? Yes  Type of Advance Directive Living will;Healthcare Power of Attorney  Does patient want to make changes to medical advance directive? No - Patient declined  Copy of [place] in Chart? Yes - validated most recent copy scanned in chart (See row information)  Would patient like information on creating a medical advance directive? -  Pre-existing out of facility DNR order (yellow form or pink MOST form) -     Chief Complaint  Patient presents with  . Acute Visit    Burning when urinating.     HPI:  Pt is a 86 y.o. female seen today for an acute visit for evaluation of burning with urination.He was recently seen 08/03/2019 by Dr.Gupta with similar symptoms she was cefdinir 300 mg tablet twice daily x 5 days.she states completed antibiotics but still has lower abdominal pain and burning with urination.she increased her water intake yesterday and burning seems to have improved.she denies any fever,chills,nausea,vomiting or back pain.she has chronic tiredness.daughter states has chronic tiredness and has labs work out.  Past Medical History:  Diagnosis Date  . BPPV (benign paroxysmal positional vertigo) 01/08/2016  . Bradycardia    a.  Coreg decreased due to HR low 40s on event monitor 07/31/15 -> further decreased due to HR upper 30s in 01/2016.  . Breast nodule 02/15/2019  . CAD (coronary artery disease)    a. RV infarct 07-30-2005 c/b high grade AV block with total occlusion of RCA, unable to treat with PCI. b. Nuc 10/2015: scar but no ischemia.  . Carotid artery disease ([place])   . Chronic anemia   . Chronic lower back pain   . CKD (chronic kidney disease), stage III   . Complication of anesthesia    "almost died when I had my breast reduction"  . Diabetes ([place]) 30-Jul-2000  . Dyslipidemia   . GERD (gastroesophageal reflux disease)   . Heart murmur   . High cholesterol   . History of hiatal hernia   . HTN (hypertension)   . Hyperthyroidism   . Memory problem 02/15/2019  . Meniere disease    "for years" takes Antivert 3 times per day (01/31/2016)  . Myocardial infarction [facility]) 2005-07-30   "almost died"  . Near syncope   . Osteoporosis   . Presbycusis of both ears 01/29/2016  . Renal artery stenosis ([facility])    a. left renal artery stent 2010/07/31. b. s/p repeat left renal stenting 01/2016.  . Stroke [facility])    a. Prior infarcts seen on head CT.  . TMJ pain dysfunction syndrome 08/26/2016  . Type II diabetes mellitus ([place])    "controlled w/diet and exercise only" (01/31/2016)  . Weakness 02/26/2015   Past Surgical History:  Procedure Laterality Date  . ANTERIOR CERVICAL DECOMP/DISCECTOMY FUSION  02/2002   /  notes 08/26/2010  . APPENDECTOMY    . BACK SURGERY    . BIOPSY  07/20/2019   Procedure: BIOPSY;  Surgeon: Otis Brace, MD;  Location: [facility];  Service: Gastroenterology;;  . BLADDER SUSPENSION  x3  . BREAST LUMPECTOMY  11/2007   breast needle-localized lumpectomy/notes 08/12/2012  . CARDIAC CATHETERIZATION  2007   "couldn't get stents in; I almost died'  . CATARACT EXTRACTION W/ INTRAOCULAR LENS  IMPLANT, BILATERAL Bilateral   . CATARACT EXTRACTION W/ INTRAOCULAR LENS  IMPLANT, BILATERAL Bilateral   . CHOLECYSTECTOMY OPEN      . ESOPHAGOGASTRODUODENOSCOPY (EGD) WITH PROPOFOL N/A 07/20/2019   Procedure: ESOPHAGOGASTRODUODENOSCOPY (EGD) WITH PROPOFOL;  Surgeon: Otis Brace, MD;  Location: [facility];  Service: Gastroenterology;  Laterality: N/A;  . FRACTURE SURGERY    . HIP FRACTURE SURGERY Right 2011   "added rod in my leg and a ball"  . KYPHOPLASTY N/A 03/20/2015   Procedure: THORACIC TWELVE KYPHOPLASTY;  Surgeon: Jovita Gamma, MD;  Location: [place] [facility];  Service: Neurosurgery;  Laterality: N/A;  . KYPHOPLASTY N/A 04/23/2015   Procedure: KYPHOPLASTY - LUMBAR ONE;  Surgeon: Jovita Gamma, MD;  Location: [place] [facility];  Service: Neurosurgery;  Laterality: N/A;  L1 Kyphoplasty  . NECK HARDWARE REMOVAL  11/2002   "replaced screw"  . PERIPHERAL VASCULAR CATHETERIZATION Left 12/20/2014   Procedure: Renal Angiography;  Surgeon: Wellington Hampshire, MD;  Location: [place] [facility];  Service: Cardiovascular;  Laterality: Left;  . PERIPHERAL VASCULAR CATHETERIZATION N/A 01/31/2016   Procedure: Renal Angiography;  Surgeon: Lorretta Harp, MD;  Location: [place] [facility];  Service: Cardiovascular;  Laterality: N/A;  . PERIPHERAL VASCULAR CATHETERIZATION N/A 01/31/2016   Procedure: Abdominal Aortogram;  Surgeon: Lorretta Harp, MD;  Location: [place] [facility];  Service: Cardiovascular;  Laterality: N/A;  . PERIPHERAL VASCULAR CATHETERIZATION  01/31/2016   Procedure: Peripheral Vascular Intervention;  Surgeon: Lorretta Harp, MD;  Location: [place] [facility];  Service: Cardiovascular;;  . REDUCTION MAMMAPLASTY Bilateral 1985  . renal arteriogram  12/2014   occluded previous Lt renal stent and patent rt renal artery.  [name] RENAL ARTERY STENT Left 2012  . TONSILLECTOMY    . TUBAL LIGATION    . VAGINAL HYSTERECTOMY  ~ 1995  . WRIST FRACTURE SURGERY Left 2012    Allergies  Allergen Reactions  . Fentanyl Anaphylaxis    Cardiac arrest and coded  . Fish Allergy Anaphylaxis and Rash  . Morphine And Related  Anaphylaxis    Cardiac arrest and coded Per Dr. Conley Canal patient has taken percocet without issue  . Singulair [Montelukast] Palpitations and Other (See Comments)    Increased BP and HR  . Actonel [Risedronate Sodium] Rash and Other (See Comments)    weakness  . Dilaudid [Hydromorphone Hcl] Rash and Other (See Comments)    Crying and screaming  . Fosamax [Alendronate Sodium] Rash and Other (See Comments)    Weakness and myalgias  . Hydralazine Diarrhea and Other (See Comments)    Swelling in stomach  . Lipitor [Atorvastatin] Swelling and Rash    Tongue swelling   . Lisinopril Diarrhea, Rash and Cough  . Septra [Sulfamethoxazole-Trimethoprim] Nausea And Vomiting and Rash  . Tekturna [Aliskiren] Swelling and Rash    Swelling of tongue  . Tricor [Fenofibrate] Rash and Other (See Comments)    Flu symptoms  . Imdur [Isosorbide Dinitrate] Other (See Comments)    Headaches and blindness  . Amlodipine Diarrhea  . Avapro [Irbesartan] Rash  .  Cardio Complete [Nutritional Supplements] Rash  . Cardizem [Diltiazem Hcl] Rash  . Ciprofloxacin Rash and Other (See Comments)    Black spot on body  . Codeine Rash  . Cyclobenzaprine Nausea Only  . Forteo [Parathyroid Hormone (Recomb)] Rash  . Inapsine [Droperidol] Rash  . Ivp Dye [Iodinated Diagnostic Agents] Rash  . Shellfish Allergy Rash  . Tussionex Pennkinetic Er [Hydrocod Polst-Cpm Polst Er] Itching and Rash    Outpatient Encounter Medications as of 08/17/2019  Medication Sig  . acetaminophen (TYLENOL) 500 MG tablet Take 1,000 mg by mouth every 8 (eight) hours as needed for mild pain (for pain.).   [name] aspirin 81 MG tablet Take 1 tablet (81 mg total) by mouth daily.  [name] azelastine (ASTELIN) 0.1 % nasal spray Place 1-2 sprays into both nostrils daily as needed for rhinitis or allergies.   [name] betamethasone dipropionate (DIPROLENE) 0.05 % cream Apply 1 application topically daily as needed (skin spots).   . cefdinir (OMNICEF) 300 MG capsule Take  300 mg by mouth 2 (two) times daily. For 5 days.  . cholecalciferol (VITAMIN D) 1000 UNITS tablet Take 2,000 Units by mouth daily.   . cloNIDine (CATAPRES) 0.1 MG tablet TAKE TWO TABLETS BY MOUTH IN THE MORNING, ONE TABLET AT 4 PM AND TWO TABLETS AT 10 PM DAILY  . clopidogrel (PLAVIX) 75 MG tablet Take 1 tablet (75 mg total) by mouth daily. Please make overdue appt for future refills. [phone]. 2nd attempt.  . denosumab (PROLIA) 60 MG/ML SOLN injection Inject 60 mg into the skin every 6 (six) months. Administer in upper arm, thigh, or abdomen  . docusate sodium (COLACE) 100 MG capsule Take 200 mg by mouth at bedtime as needed for mild constipation.   . fexofenadine (ALLEGRA) 180 MG tablet Take 180 mg by mouth daily.  . furosemide (LASIX) 20 MG tablet Take 1 tablet (20 mg total) by mouth 3 (three) times a week. Mon, Wed, and Fri only.  . meclizine (ANTIVERT) 25 MG tablet Take 12.5 mg by mouth as needed for dizziness.  . metFORMIN (GLUCOPHAGE-XR) 500 MG 24 hr tablet Take 500 mg by mouth daily with supper.   . methimazole (TAPAZOLE) 5 MG tablet Take 5 mg by mouth daily.  . nitroGLYCERIN (NITROSTAT) 0.4 MG SL tablet Place 0.4 mg under the tongue every 5 (five) minutes as needed for chest pain (3 doses MAX).  Glory Rosebush Delica Lancets [id number] MISC Use to test blood sugar twice daily. Dx: E11.9  . ONETOUCH ULTRA test strip Use to test blood sugar twice daily. Dx: E11.9  . pantoprazole (PROTONIX) 40 MG tablet Take 1 tablet (40 mg total) by mouth daily.  Vladimir Faster Glycol-Propyl Glycol (SYSTANE FREE OP) Place 2 drops into both eyes at bedtime as needed (dry eyes).   . simvastatin (ZOCOR) 20 MG tablet Take 20 mg by mouth every Monday, Wednesday, and Friday.   . telmisartan (MICARDIS) 40 MG tablet Take 20 mg by mouth 2 (two) times daily.  . [DISCONTINUED] meclizine (ANTIVERT) 25 MG tablet Take 1 tablet (25 mg total) by mouth 3 (three) times daily as needed for dizziness.   No facility-administered  encounter medications on file as of 08/17/2019.    Review of Systems  Constitutional: Positive for fatigue. Negative for appetite change, chills and fever.  Respiratory: Negative for cough, choking, chest tightness, shortness of breath and wheezing.   Cardiovascular: Negative for chest pain, palpitations and leg swelling.  Gastrointestinal: Positive for abdominal pain. Negative for abdominal distention, diarrhea, nausea  and vomiting.       Lower abdominal pain   Genitourinary: Positive for dysuria and frequency. Negative for difficulty urinating, flank pain and urgency.  Musculoskeletal: Positive for gait problem. Negative for myalgias.  Neurological: Negative for dizziness, speech difficulty, light-headedness and headaches.  Psychiatric/Behavioral: Negative for agitation, confusion and sleep disturbance. The patient is not nervous/anxious.     Immunization History  Administered Date(s) Administered  . Influenza, High Dose Seasonal PF 01/09/2017, 12/26/2017, 12/06/2018  . Influenza-Unspecified 12/14/2014, 01/03/2016, 01/09/2017  . Moderna SARS-COVID-2 Vaccination 04/18/2019, 05/16/2019  . Pneumococcal-Unspecified 12/14/2014  . Tdap 04/06/2019  . Zoster Recombinat (Shingrix) 11/12/2016, 02/03/2017   Pertinent  Health Maintenance Due  Topic Date Due  . FOOT EXAM  Never done  . OPHTHALMOLOGY EXAM  Never done  . PNA vac Low Risk Adult (2 of 2 - PCV13) 12/14/2015  . HEMOGLOBIN A1C  08/16/2019  . INFLUENZA VACCINE  11/13/2019  . DEXA SCAN  Completed   Fall Risk  08/17/2019 06/01/2019 04/13/2019  Falls in the past year? 0 0 1  Number falls in past yr: 0 0 1  Injury with Fall? 0 - 1     Vitals:   08/17/19 1021  BP: 140/84  Pulse: 88  Resp: 16  Temp: 98.9 F (37.2 C)  SpO2: 98%  Weight: 103 lb 6.4 oz (46.9 kg)  Height: 5' (1.524 m)   Body mass index is 20.19 kg/m. Physical Exam Vitals reviewed.  Constitutional:      General: She is not in acute distress.    Appearance: She  is normal weight. She is not ill-appearing.  HENT:     Head: Normocephalic.  Neck:     Vascular: No carotid bruit.  Cardiovascular:     Rate and Rhythm: Normal rate and regular rhythm.     Pulses: Normal pulses.     Heart sounds: Murmur present. No friction rub. No gallop.   Pulmonary:     Effort: Pulmonary effort is normal. No respiratory distress.     Breath sounds: Normal breath sounds. No wheezing, rhonchi or rales.  Chest:     Chest wall: No tenderness.  Abdominal:     General: Bowel sounds are normal. There is no distension.     Palpations: Abdomen is soft. There is no mass.     Tenderness: There is abdominal tenderness in the suprapubic area. There is no right CVA tenderness, left CVA tenderness, guarding or rebound.  Musculoskeletal:        General: No swelling or tenderness.     Cervical back: Normal range of motion. No rigidity or tenderness.     Right lower leg: No edema.     Left lower leg: No edema.     Comments: Unsteady gait walks with a walker.   Lymphadenopathy:     Cervical: No cervical adenopathy.  Skin:    General: Skin is warm.     Coloration: Skin is not pale.     Findings: No bruising, erythema or rash.  Neurological:     Mental Status: She is alert and oriented to person, place, and time.     Cranial Nerves: No cranial nerve deficit.     Motor: No weakness.     Gait: Gait abnormal.     Labs reviewed: Recent Labs    02/16/19 0500 02/21/19 1439 03/08/19 1422 03/25/19 1554 04/29/19 1415  NA 128*   < > 136 130* 130*  K 3.4*   < > 5.3* 4.5 4.2  CL  92*   < > 100 91* 91*  CO2 23   < > 25 26 25   GLUCOSE 162*   < > 95 216* 198*  BUN 13   < > 20 20 18   CREATININE 1.05*   < > 1.17* 1.14* 1.22*  CALCIUM 9.0   < > 9.7 9.3 9.7  MG 1.5*  --   --   --   --   PHOS 4.1  --   --   --   --    < > = values in this interval not displayed.   Recent Labs    02/15/19 1313 02/16/19 0500 04/29/19 1415  AST 23 19 16   ALT 17 15 12   ALKPHOS 68 67  --     BILITOT 0.9 0.7 0.3  PROT 6.5 5.7* 6.6  ALBUMIN 3.6 3.1*  --    Recent Labs    04/29/19 1415 05/09/19 1408 06/06/19 1117  WBC 7.4 10.4 5.6  NEUTROABS 5,491 8,372* 3,567  HGB 7.1* 7.3* 8.2*  HCT 23.1* 24.0* 26.7*  MCV 77.0* 76.2* 77.4*  PLT 530* 498* 406*   Lab Results  Component Value Date   TSH 3.01 04/29/2019   Lab Results  Component Value Date   HGBA1C 6.8 (H) 02/16/2019   Lab Results  Component Value Date   CHOL 187 04/29/2019   HDL 56 04/29/2019   LDLCALC 102 (H) 04/29/2019   TRIG 175 (H) 04/29/2019   CHOLHDL 3.3 04/29/2019    Significant Diagnostic Results in last 30 days:  No results found.  Assessment/Plan  Dysuria Afebrile.suprapubic tenderness on palpation noted.Non-distended bladder. - POC Urinalysis Dipstick indicates yellow clear urine negative for Nitrites but positive for protein and moderate 2+ leukocytes.Discussed results with patient and daughter will send urine for culture and sensitivity aware results will take 3 days then will call her with results.  - encouraged to increase water intake to 6-8 glasses per day  - continue on cranberry tablet twice daily - urine culture send will call you with results in 3 days  - Notify provider's office if running for any fever > 100.5 or chills  Family/ staff Communication: Reviewed plan of care with patient  Labs/tests ordered:  Urine culture    Next Appointment: as needed if symptoms worsen or fail to improve.   Sandrea Hughs, NP

## 2019-08-18 LAB — CBC WITH DIFFERENTIAL/PLATELET
Absolute Monocytes: 424 cells/uL (ref 200–950)
Basophils Absolute: 23 cells/uL (ref 0–200)
Basophils Relative: 0.3 %
Eosinophils Absolute: 108 cells/uL (ref 15–500)
Eosinophils Relative: 1.4 %
HCT: 27 % — ABNORMAL LOW (ref 35.0–45.0)
Hemoglobin: 8.8 g/dL — ABNORMAL LOW (ref 11.7–15.5)
Lymphs Abs: 1294 cells/uL (ref 850–3900)
MCH: 24.2 pg — ABNORMAL LOW (ref 27.0–33.0)
MCHC: 32.6 g/dL (ref 32.0–36.0)
MCV: 74.2 fL — ABNORMAL LOW (ref 80.0–100.0)
MPV: 8.8 fL (ref 7.5–12.5)
Monocytes Relative: 5.5 %
Neutro Abs: 5852 cells/uL (ref 1500–7800)
Neutrophils Relative %: 76 %
Platelets: 412 10*3/uL — ABNORMAL HIGH (ref 140–400)
RBC: 3.64 10*6/uL — ABNORMAL LOW (ref 3.80–5.10)
RDW: 14.6 % (ref 11.0–15.0)
Total Lymphocyte: 16.8 %
WBC: 7.7 10*3/uL (ref 3.8–10.8)

## 2019-08-18 LAB — COMPLETE METABOLIC PANEL WITH GFR
AG Ratio: 1.6 (calc) (ref 1.0–2.5)
ALT: 14 U/L (ref 6–29)
AST: 17 U/L (ref 10–35)
Albumin: 3.9 g/dL (ref 3.6–5.1)
Alkaline phosphatase (APISO): 62 U/L (ref 37–153)
BUN/Creatinine Ratio: 18 (calc) (ref 6–22)
BUN: 18 mg/dL (ref 7–25)
CO2: 23 mmol/L (ref 20–32)
Calcium: 9 mg/dL (ref 8.6–10.4)
Chloride: 99 mmol/L (ref 98–110)
Creat: 1.02 mg/dL — ABNORMAL HIGH (ref 0.60–0.88)
GFR, Est African American: 58 mL/min/{1.73_m2} — ABNORMAL LOW (ref >=60)
GFR, Est Non African American: 50 mL/min/{1.73_m2} — ABNORMAL LOW (ref >=60)
Globulin: 2.5 g/dL (calc) (ref 1.9–3.7)
Glucose, Bld: 145 mg/dL — ABNORMAL HIGH (ref 65–99)
Potassium: 4.7 mmol/L (ref 3.5–5.3)
Sodium: 131 mmol/L — ABNORMAL LOW (ref 135–146)
Total Bilirubin: 0.3 mg/dL (ref 0.2–1.2)
Total Protein: 6.4 g/dL (ref 6.1–8.1)

## 2019-08-18 LAB — HEMOGLOBIN A1C
Hgb A1c MFr Bld: 7.8 % of total Hgb — ABNORMAL HIGH (ref <5.7)
Mean Plasma Glucose: 177 (calc)
eAG (mmol/L): 9.8 (calc)

## 2019-08-18 LAB — LIPID PANEL
Cholesterol: 192 mg/dL (ref <200)
HDL: 48 mg/dL — ABNORMAL LOW (ref >=50)
LDL Cholesterol (Calc): 124 mg/dL (calc) — ABNORMAL HIGH
Non-HDL Cholesterol (Calc): 144 mg/dL (calc) — ABNORMAL HIGH (ref <130)
Total CHOL/HDL Ratio: 4 (calc) (ref <5.0)
Triglycerides: 98 mg/dL (ref <150)

## 2019-08-18 LAB — TSH: TSH: 2.11 mIU/L (ref 0.40–4.50)

## 2019-08-19 ENCOUNTER — Other Ambulatory Visit: Payer: Self-pay

## 2019-08-19 LAB — URINE CULTURE
MICRO NUMBER:: 10446144
SPECIMEN QUALITY:: ADEQUATE

## 2019-08-19 MED ORDER — AMOXICILLIN-POT CLAVULANATE 875-125 MG PO TABS: 1.0000 | ORAL_TABLET | Freq: Two times a day (BID) | ORAL | 0 refills | Status: AC

## 2019-08-19 MED ORDER — SACCHAROMYCES BOULARDII 250 MG PO CAPS: 250.0000 mg | ORAL_CAPSULE | Freq: Two times a day (BID) | ORAL | 0 refills | Status: AC

## 2019-08-22 MED ORDER — FERROUS SULFATE 325 (65 FE) MG PO TABS: 325.0000 mg | ORAL_TABLET | ORAL | 11 refills | Status: DC

## 2019-08-22 NOTE — Addendum Note (Signed)
Addended by: Logan Bores on: 08/22/2019 09:49 AM   Modules accepted: Orders

## 2019-09-01 DIAGNOSIS — L720 Epidermal cyst: Secondary | ICD-10-CM | POA: Diagnosis not present

## 2019-09-01 DIAGNOSIS — L570 Actinic keratosis: Secondary | ICD-10-CM | POA: Diagnosis not present

## 2019-09-01 DIAGNOSIS — L820 Inflamed seborrheic keratosis: Secondary | ICD-10-CM | POA: Diagnosis not present

## 2019-09-01 DIAGNOSIS — C44311 Basal cell carcinoma of skin of nose: Secondary | ICD-10-CM | POA: Diagnosis not present

## 2019-09-01 DIAGNOSIS — D485 Neoplasm of uncertain behavior of skin: Secondary | ICD-10-CM | POA: Diagnosis not present

## 2019-09-05 ENCOUNTER — Telehealth: Payer: Self-pay

## 2019-09-05 NOTE — Telephone Encounter (Signed)
Juliann Pulse states her mom has had UTI for last couple of weeks and has been on two rounds of antibiotics but she is still burning with urination and at home test strips shows infection according to [place]. Juliann Pulse wants to know should they just wait till her apnt of Wed. With Dr. Lyndel Safe. Lyndel Safe out of office today, sending to [place], NP.

## 2019-09-05 NOTE — Telephone Encounter (Signed)
Apnt scheduled in office.

## 2019-09-06 ENCOUNTER — Encounter: Payer: Self-pay | Admitting: Family

## 2019-09-06 ENCOUNTER — Ambulatory Visit (INDEPENDENT_AMBULATORY_CARE_PROVIDER_SITE_OTHER): Payer: PPO | Admitting: Family

## 2019-09-06 ENCOUNTER — Other Ambulatory Visit: Payer: Self-pay

## 2019-09-06 ENCOUNTER — Encounter: Payer: Self-pay | Admitting: Neurology

## 2019-09-06 ENCOUNTER — Ambulatory Visit: Payer: PPO | Admitting: Neurology

## 2019-09-06 VITALS — BP 123/64 | HR 71 | Wt 104.0 lb

## 2019-09-06 VITALS — BP 100/80 | HR 73 | Temp 98.4°F | Resp 16 | Ht 60.0 in | Wt 105.0 lb

## 2019-09-06 DIAGNOSIS — I679 Cerebrovascular disease, unspecified: Secondary | ICD-10-CM | POA: Diagnosis not present

## 2019-09-06 DIAGNOSIS — R399 Unspecified symptoms and signs involving the genitourinary system: Secondary | ICD-10-CM | POA: Diagnosis not present

## 2019-09-06 DIAGNOSIS — R413 Other amnesia: Secondary | ICD-10-CM | POA: Diagnosis not present

## 2019-09-06 DIAGNOSIS — R269 Unspecified abnormalities of gait and mobility: Secondary | ICD-10-CM

## 2019-09-06 HISTORY — DX: Unspecified abnormalities of gait and mobility: R26.9

## 2019-09-06 LAB — POCT URINALYSIS DIPSTICK
Bilirubin, UA: NEGATIVE
Glucose, UA: NEGATIVE
Ketones, UA: NEGATIVE
Nitrite, UA: POSITIVE
Protein, UA: POSITIVE — AB
Spec Grav, UA: 1.015 (ref 1.010–1.025)
Urobilinogen, UA: 1 E.U./dL
pH, UA: 6 (ref 5.0–8.0)

## 2019-09-06 NOTE — Patient Instructions (Signed)
Continue with AZO tablet for one more day  - Increase water intake to 6-8 glasses daily  - Urine specimen send for culture will call you with results  - Notify provider's office if symptoms worsen or running any fever or chills.  Urinary Tract Infection, Adult A urinary tract infection (UTI) is an infection of any part of the urinary tract. The urinary tract includes:  The kidneys.  The ureters.  The bladder.  The urethra. These organs make, store, and get rid of pee (urine) in the body. What are the causes? This is caused by germs (bacteria) in your genital area. These germs grow and cause swelling (inflammation) of your urinary tract. What increases the risk? You are more likely to develop this condition if:  You have a small, thin tube (catheter) to drain pee.  You cannot control when you pee or poop (incontinence).  You are female, and: ? You use these methods to prevent pregnancy:  A medicine that kills sperm (spermicide).  A device that blocks sperm (diaphragm). ? You have low levels of a female hormone (estrogen). ? You are pregnant.  You have genes that add to your risk.  You are sexually active.  You take antibiotic medicines.  You have trouble peeing because of: ? A prostate that is bigger than normal, if you are female. ? A blockage in the part of your body that drains pee from the bladder (urethra). ? A kidney stone. ? A nerve condition that affects your bladder (neurogenic bladder). ? Not getting enough to drink. ? Not peeing often enough.  You have other conditions, such as: ? Diabetes. ? A weak disease-fighting system (immune system). ? Sickle cell disease. ? Gout. ? Injury of the spine. What are the signs or symptoms? Symptoms of this condition include:  Needing to pee right away (urgently).  Peeing often.  Peeing small amounts often.  Pain or burning when peeing.  Blood in the pee.  Pee that smells bad or not like normal.  Trouble  peeing.  Pee that is cloudy.  Fluid coming from the vagina, if you are female.  Pain in the belly or lower back. Other symptoms include:  Throwing up (vomiting).  No urge to eat.  Feeling mixed up (confused).  Being tired and grouchy (irritable).  A fever.  Watery poop (diarrhea). How is this treated? This condition may be treated with:  Antibiotic medicine.  Other medicines.  Drinking enough water. Follow these instructions at home:  Medicines  Take over-the-counter and prescription medicines only as told by your doctor.  If you were prescribed an antibiotic medicine, take it as told by your doctor. Do not stop taking it even if you start to feel better. General instructions  Make sure you: ? Pee until your bladder is empty. ? Do not hold pee for a long time. ? Empty your bladder after sex. ? Wipe from front to back after pooping if you are a female. Use each tissue one time when you wipe.  Drink enough fluid to keep your pee pale yellow.  Keep all follow-up visits as told by your doctor. This is important. Contact a doctor if:  You do not get better after 1-2 days.  Your symptoms go away and then come back. Get help right away if:  You have very bad back pain.  You have very bad pain in your lower belly.  You have a fever.  You are sick to your stomach (nauseous).  You are throwing  up. Summary  A urinary tract infection (UTI) is an infection of any part of the urinary tract.  This condition is caused by germs in your genital area.  There are many risk factors for a UTI. These include having a small, thin tube to drain pee and not being able to control when you pee or poop.  Treatment includes antibiotic medicines for germs.  Drink enough fluid to keep your pee pale yellow. This information is not intended to replace advice given to you by your health care provider. Make sure you discuss any questions you have with your health care  provider. Document Revised: 03/18/2018 Document Reviewed: 10/08/2017 Elsevier Patient Education  2020 [employer].

## 2019-09-06 NOTE — Patient Instructions (Signed)
Take vitamin 500 mg three times a day and take the cranberry tablets daily to help reduce the frequency of the bladder infections.

## 2019-09-06 NOTE — Progress Notes (Signed)
Reason for visit: Memory disturbance, gait disturbance, vertigo  [name] is an 86 y.o. female  History of present illness:  [name] is an 86 year old right-handed white female with a history of episodic vertigo throughout her life.  The patient has evidence of significant cerebrovascular disease by CT and MRI of the brain.  She has no associated gait disorder and a memory disorder.  She has gone back to taking 12.5 mg of meclizine if needed for her vertigo which has been very helpful and has prevented falls.  The patient walks with a walker.  She has not had a significant change in memory or balance since last seen.  She denies any falls since last seen.  She returns for an evaluation.  Past Medical History:  Diagnosis Date  . BPPV (benign paroxysmal positional vertigo) 01/08/2016  . Bradycardia    a. Coreg decreased due to HR low 40s on event monitor 22-Jul-2015 -> further decreased due to HR upper 30s in 01/2016.  . Breast nodule 02/15/2019  . CAD (coronary artery disease)    a. RV infarct 07/21/05 c/b high grade AV block with total occlusion of RCA, unable to treat with PCI. b. Nuc 10/2015: scar but no ischemia.  . Carotid artery disease ([place])   . Chronic anemia   . Chronic lower back pain   . CKD (chronic kidney disease), stage III   . Complication of anesthesia    "almost died when I had my breast reduction"  . Diabetes ([place]) Jul 21, 2000  . Dyslipidemia   . Gait abnormality 09/06/2019  . GERD (gastroesophageal reflux disease)   . Heart murmur   . High cholesterol   . History of hiatal hernia   . HTN (hypertension)   . Hyperthyroidism   . Memory problem 02/15/2019  . Meniere disease    "for years" takes Antivert 3 times per day (01/31/2016)  . Myocardial infarction [facility]) 07-21-2005   "almost died"  . Near syncope   . Osteoporosis   . Presbycusis of both ears 01/29/2016  . Renal artery stenosis ([facility])    a. left renal artery stent 2010/07/22. b. s/p repeat left renal stenting 01/2016.  .  Stroke [facility])    a. Prior infarcts seen on head CT.  . TMJ pain dysfunction syndrome 08/26/2016  . Type II diabetes mellitus ([place])    "controlled w/diet and exercise only" (01/31/2016)  . Weakness 02/26/2015    Past Surgical History:  Procedure Laterality Date  . ANTERIOR CERVICAL DECOMP/DISCECTOMY FUSION  02/2002   [name] 08/26/2010  . APPENDECTOMY    . BACK SURGERY    . BIOPSY  07/20/2019   Procedure: BIOPSY;  Surgeon: Otis Brace, MD;  Location: [facility];  Service: Gastroenterology;;  . BLADDER SUSPENSION  x3  . BREAST LUMPECTOMY  11/2007   breast needle-localized lumpectomy/notes 08/12/2012  . CARDIAC CATHETERIZATION  07/21/05   "couldn't get stents in; I almost died'  . CATARACT EXTRACTION W/ INTRAOCULAR LENS  IMPLANT, BILATERAL Bilateral   . CATARACT EXTRACTION W/ INTRAOCULAR LENS  IMPLANT, BILATERAL Bilateral   . CHOLECYSTECTOMY OPEN    . ESOPHAGOGASTRODUODENOSCOPY (EGD) WITH PROPOFOL N/A 07/20/2019   Procedure: ESOPHAGOGASTRODUODENOSCOPY (EGD) WITH PROPOFOL;  Surgeon: Otis Brace, MD;  Location: [facility];  Service: Gastroenterology;  Laterality: N/A;  . FRACTURE SURGERY    . HIP FRACTURE SURGERY Right 2009-07-21   "added rod in my leg and a ball"  . KYPHOPLASTY N/A 03/20/2015   Procedure: THORACIC TWELVE KYPHOPLASTY;  Surgeon: Jovita Gamma,  MD;  Location: [place] [facility];  Service: Neurosurgery;  Laterality: N/A;  . KYPHOPLASTY N/A 04/23/2015   Procedure: KYPHOPLASTY - LUMBAR ONE;  Surgeon: Jovita Gamma, MD;  Location: [place] [facility];  Service: Neurosurgery;  Laterality: N/A;  L1 Kyphoplasty  . NECK HARDWARE REMOVAL  11/2002   "replaced screw"  . PERIPHERAL VASCULAR CATHETERIZATION Left 12/20/2014   Procedure: Renal Angiography;  Surgeon: Wellington Hampshire, MD;  Location: [place] CV LAB;  Service: Cardiovascular;  Laterality: Left;  . PERIPHERAL VASCULAR CATHETERIZATION N/A 01/31/2016   Procedure: Renal Angiography;  Surgeon: Lorretta Harp, MD;  Location: [place]  CV LAB;  Service: Cardiovascular;  Laterality: N/A;  . PERIPHERAL VASCULAR CATHETERIZATION N/A 01/31/2016   Procedure: Abdominal Aortogram;  Surgeon: Lorretta Harp, MD;  Location: [place] CV LAB;  Service: Cardiovascular;  Laterality: N/A;  . PERIPHERAL VASCULAR CATHETERIZATION  01/31/2016   Procedure: Peripheral Vascular Intervention;  Surgeon: Lorretta Harp, MD;  Location: [place] CV LAB;  Service: Cardiovascular;;  . REDUCTION MAMMAPLASTY Bilateral 1985  . renal arteriogram  12/2014   occluded previous Lt renal stent and patent rt renal artery.  [name] RENAL ARTERY STENT Left 2012  . TONSILLECTOMY    . TUBAL LIGATION    . VAGINAL HYSTERECTOMY  ~ 1995  . WRIST FRACTURE SURGERY Left 2012    Family History  Problem Relation Age of Onset  . Diabetes type II Mother   . Heart attack Mother   . Heart disease Mother   . Stroke Mother   . Congestive Heart Failure Father   . Pulmonary embolism Father   . Alzheimer's disease Sister   . Heart disease Sister   . Heart disease Brother   . Healthy Sister   . Healthy Sister   . Stroke Brother   . Dementia Brother        VASCULAR  . Stroke Brother   . Diabetes type II Brother   . Other Brother        KILLED IN PLANE CRASH  . Pulmonary disease Sister   . Heart attack Sister   . Kidney failure Sister   . Diabetes type II Sister   . Diabetes type II Sister   . Kidney failure Sister     Social history:  reports that she has quit smoking. Her smoking use included cigarettes. She has a 30.00 pack-year smoking history. She has never used smokeless tobacco. She reports that she does not drink alcohol or use drugs.    Allergies  Allergen Reactions  . Fentanyl Anaphylaxis    Cardiac arrest and coded  . Fish Allergy Anaphylaxis and Rash  . Morphine And Related Anaphylaxis    Cardiac arrest and coded Per Dr. Conley Canal patient has taken percocet without issue  . Singulair [Montelukast] Palpitations and Other (See Comments)     Increased BP and HR  . Actonel [Risedronate Sodium] Rash and Other (See Comments)    weakness  . Dilaudid [Hydromorphone Hcl] Rash and Other (See Comments)    Crying and screaming  . Fosamax [Alendronate Sodium] Rash and Other (See Comments)    Weakness and myalgias  . Hydralazine Diarrhea and Other (See Comments)    Swelling in stomach  . Lipitor [Atorvastatin] Swelling and Rash    Tongue swelling   . Lisinopril Diarrhea, Rash and Cough  . Septra [Sulfamethoxazole-Trimethoprim] Nausea And Vomiting and Rash  . Tekturna [Aliskiren] Swelling and Rash    Swelling of tongue  . Tricor [Fenofibrate] Rash and  Other (See Comments)    Flu symptoms  . Imdur [Isosorbide Dinitrate] Other (See Comments)    Headaches and blindness  . Amlodipine Diarrhea  . Avapro [Irbesartan] Rash  . Cardio Complete [Nutritional Supplements] Rash  . Cardizem [Diltiazem Hcl] Rash  . Ciprofloxacin Rash and Other (See Comments)    Black spot on body  . Codeine Rash  . Cyclobenzaprine Nausea Only  . Forteo [Parathyroid Hormone (Recomb)] Rash  . Inapsine [Droperidol] Rash  . Ivp Dye [Iodinated Diagnostic Agents] Rash  . Shellfish Allergy Rash  . Tussionex Pennkinetic Er [Hydrocod Polst-Cpm Polst Er] Itching and Rash    Medications:  Prior to Admission medications   Medication Sig Start Date End Date Taking? Authorizing Provider  acetaminophen (TYLENOL) 500 MG tablet Take 1,000 mg by mouth every 8 (eight) hours as needed for mild pain (for pain.).    Yes [provider]  aspirin 81 MG tablet Take 1 tablet (81 mg total) by mouth daily. 02/01/16  Yes Dunn, Dayna N, PA-C  azelastine (ASTELIN) 0.1 % nasal spray Place 1-2 sprays into both nostrils daily as needed for rhinitis or allergies.  12/14/15  Yes [provider]  betamethasone dipropionate (DIPROLENE) 0.05 % cream Apply 1 application topically daily as needed (skin spots).  12/10/18  Yes [provider]  cefdinir (OMNICEF) 300 MG  capsule Take 300 mg by mouth 2 (two) times daily. For 5 days.   Yes [provider]  cholecalciferol (VITAMIN D) 1000 UNITS tablet Take 2,000 Units by mouth daily.    Yes [provider]  cloNIDine (CATAPRES) 0.1 MG tablet TAKE TWO TABLETS BY MOUTH IN THE MORNING, ONE TABLET AT 4 PM AND TWO TABLETS AT 10 PM DAILY 04/28/19  Yes Richardson Dopp T, PA-C  clopidogrel (PLAVIX) 75 MG tablet Take 1 tablet (75 mg total) by mouth daily. Please make overdue appt for future refills. [phone]. 2nd attempt. 04/20/18  Yes Belva Crome, MD  denosumab (PROLIA) 60 MG/ML SOLN injection Inject 60 mg into the skin every 6 (six) months. Administer in upper arm, thigh, or abdomen   Yes [provider]  docusate sodium (COLACE) 100 MG capsule Take 200 mg by mouth at bedtime as needed for mild constipation.    Yes [provider]  ferrous sulfate (FERROUSUL) 325 (65 FE) MG tablet Take 1 tablet (325 mg total) by mouth as directed. Three times weekly with food 08/22/19  Yes [name], MD  fexofenadine (ALLEGRA) 180 MG tablet Take 180 mg by mouth daily.   Yes [provider]  meclizine (ANTIVERT) 25 MG tablet Take 12.5 mg by mouth as needed for dizziness.   Yes [provider]  metFORMIN (GLUCOPHAGE-XR) 500 MG 24 hr tablet Take 500 mg by mouth daily with supper.  11/28/18  Yes [provider]  methimazole (TAPAZOLE) 5 MG tablet Take 5 mg by mouth daily. 08/27/15  Yes [provider]  nitroGLYCERIN (NITROSTAT) 0.4 MG SL tablet Place 0.4 mg under the tongue every 5 (five) minutes as needed for chest pain (3 doses MAX).   Yes [provider]  OneTouch Delica Lancets [id number] MISC Use to test blood sugar twice daily. Dx: E11.9 07/08/19  Yes [name], MD  [facility] ULTRA test strip Use to test blood sugar twice daily. Dx: E11.9 07/08/19  Yes [name], MD  pantoprazole (PROTONIX) 40 MG tablet Take 1 tablet (40 mg total) by mouth daily. 07/20/19   Yes Otis Brace, MD  PHENAZOPYRIDINE  HCL PO Take 99.5 mg by mouth 6 (six) times daily.   Yes [provider]  Polyethyl Glycol-Propyl Glycol (SYSTANE FREE OP) Place 2 drops into both eyes at bedtime as needed (dry eyes).    Yes [provider]  simvastatin (ZOCOR) 20 MG tablet Take 20 mg by mouth every Monday, Wednesday, and Friday.    Yes [provider]  telmisartan (MICARDIS) 40 MG tablet Take 20 mg by mouth 2 (two) times daily.   Yes [provider]  furosemide (LASIX) 20 MG tablet Take 1 tablet (20 mg total) by mouth 3 (three) times a week. Mon, Wed, and Fri only. 02/28/19 08/17/19  Richardson Dopp T, PA-C    ROS:  Out of a complete 14 system review of symptoms, the patient complains only of the following symptoms, and all other reviewed systems are negative.  Memory problems, vertigo, walking difficulty  Blood pressure 123/64, pulse 71, weight 104 lb (47.2 kg).  Physical Exam  General: The patient is alert and cooperative at the time of the examination.  Skin: No significant peripheral edema is noted.   Neurologic Exam  Mental status: The patient is alert and oriented x 3 at the time of the examination. The Mini-Mental status examination done today shows a total score 23/30.  The patient is able to name 6 four-legged animals in 60 seconds.   Cranial nerves: Facial symmetry is present. Speech is normal, no aphasia or dysarthria is noted. Extraocular movements are full. Visual fields are full.  Motor: The patient has good strength in all 4 extremities.  Sensory examination: Soft touch sensation is symmetric on the face, arms, and legs.  Coordination: The patient has good finger-nose-finger and heel-to-shin bilaterally.  Gait and station: The patient has a slightly wide-based gait, she can walk well with good stability with a walker.  She is able to walk independently as well.  The patient has a negative Romberg with slight unsteadiness.   Tandem gait was not attempted.  Reflexes: Deep tendon reflexes are symmetric.   Assessment/Plan:  1.  Memory disturbance  2.  Episodic vertigo  3.  Gait disturbance  4.  Cerebrovascular disease by MRI  5.  Recurrent urinary tract infections  The patient claims that she is having frequent urinary tract infections, she will continue her cranberry tablets and add vitamin C 500 mg 3 times daily.  She does not wish to go on any medication for memory, she will follow-up here in 6 months.  She will continue to use her meclizine if needed.  Jill Alexanders MD 09/06/2019 12:06 PM  [facility] [address] [place], [place] [address]  Phone [phone] Fax [phone]

## 2019-09-06 NOTE — Progress Notes (Signed)
Provider: Raydel Hosick FNP-C  [name], MD  Patient Care Team: [name], MD as PCP - General (Internal Medicine) Belva Crome, MD as PCP - Cardiology (Cardiology)  Extended Emergency Contact Information Primary Emergency Contact: Luevano,Walter Address: [address]          [place], [place] [address] [place] Phone: [phone] Mobile Phone: [phone] Relation: Spouse Secondary Emergency Contact: Merian Capron, [place] [address] [place] Phone: [phone] Mobile Phone: [phone] Relation: Son  Code Status:  Full Code  Goals of care: Advanced Directive information Advanced Directives 09/06/2019  Does Patient Have a Medical Advance Directive? Yes  Type of Advance Directive Living will;Healthcare Power of Attorney  Does patient want to make changes to medical advance directive? No - Patient declined  Copy of [place] in Chart? Yes - validated most recent copy scanned in chart (See row information)  Would patient like information on creating a medical advance directive? -  Pre-existing out of facility DNR order (yellow form or pink MOST form) -     Chief Complaint  Patient presents with  . Acute Visit    UTI    HPI:  Pt is a 86 y.o. female seen today for an acute visit for evaluation of symptoms of urinary tract infection x 4 days.she is here with her care giver.Has had  burning and pain since Saturday 09/03/2019.Has had some lower abdominal pain sometimes.Her daughter brought AZO tablet which she has been taking five times per day with some relief.Care giver checked urine for UTI yesterday using home kit showed positive for UTI.Urine color was orange from AZO tablet.shedenies any fever,chills or back pain. No confusion or agitation reported by care giver.    Past Medical History:  Diagnosis Date  . BPPV (benign paroxysmal positional vertigo) 01/08/2016  . Bradycardia      a. Coreg decreased due to HR low 40s on event monitor 06-02-2015 -> further decreased due to HR upper 30s in 01/2016.  . Breast nodule 02/15/2019  . CAD (coronary artery disease)    a. RV infarct 06-01-05 c/b high grade AV block with total occlusion of RCA, unable to treat with PCI. b. Nuc 10/2015: scar but no ischemia.  . Carotid artery disease ([facility])   . Chronic anemia   . Chronic lower back pain   . CKD (chronic kidney disease), stage III   . Complication of anesthesia    "almost died when I had my breast reduction"  . Diabetes ([facility]) Jun 01, 2000  . Dyslipidemia   . GERD (gastroesophageal reflux disease)   . Heart murmur   . High cholesterol   . History of hiatal hernia   . HTN (hypertension)   . Hyperthyroidism   . Memory problem 02/15/2019  . Meniere disease    "for years" takes Antivert 3 times per day (01/31/2016)  . Myocardial infarction [facility]) 06/01/05   "almost died"  . Near syncope   . Osteoporosis   . Presbycusis of both ears 01/29/2016  . Renal artery stenosis ([facility])    a. left renal artery stent Jun 01, 2010. b. s/p repeat left renal stenting 01/2016.  . Stroke [facility])    a. Prior infarcts seen on head CT.  . TMJ pain dysfunction syndrome 08/26/2016  . Type II diabetes mellitus ([place])    "controlled w/diet and exercise only" (01/31/2016)  . Weakness 02/26/2015   Past Surgical History:  Procedure Laterality Date  . ANTERIOR CERVICAL DECOMP/DISCECTOMY FUSION  02/2002   Archie Endo 08/26/2010  . APPENDECTOMY    . BACK SURGERY    . BIOPSY  07/20/2019   Procedure: BIOPSY;  Surgeon: Otis Brace, MD;  Location: [facility] ENDOSCOPY;  Service: Gastroenterology;;  . BLADDER SUSPENSION  x3  . BREAST LUMPECTOMY  11/2007   breast needle-localized lumpectomy/notes 08/12/2012  . CARDIAC CATHETERIZATION  2007   "couldn't get stents in; I almost died'  . CATARACT EXTRACTION W/ INTRAOCULAR LENS  IMPLANT, BILATERAL Bilateral   . CATARACT EXTRACTION W/ INTRAOCULAR LENS  IMPLANT, BILATERAL Bilateral   . CHOLECYSTECTOMY  OPEN    . ESOPHAGOGASTRODUODENOSCOPY (EGD) WITH PROPOFOL N/A 07/20/2019   Procedure: ESOPHAGOGASTRODUODENOSCOPY (EGD) WITH PROPOFOL;  Surgeon: Otis Brace, MD;  Location: [facility] ENDOS[facility];  Service: Gastroenterology;  Laterality: N/A;  . FRACTURE SURGERY    . HIP FRACTURE SURGERY Right 2011   "added rod in my leg and a ball"  . KYPHOPLASTY N/A 03/20/2015   Procedure: THORACIC TWELVE KYPHOPLASTY;  Surgeon: Jovita Gamma, MD;  Location: [place] NEURO ORS;  Service: Neurosurgery;  Laterality: N/A;  . KYPHOPLASTY N/A 04/23/2015   Procedure: KYPHOPLASTY - LUMBAR ONE;  Surgeon: Jovita Gamma, MD;  Location: [place] NEURO ORS;  Service: Neurosurgery;  Laterality: N/A;  L1 Kyphoplasty  . NECK HARDWARE REMOVAL  11/2002   "replaced screw"  . PERIPHERAL VASCULAR CATHETERIZATION Left 12/20/2014   Procedure: Renal Angiography;  Surgeon: [place] Hampshire, MD;  Location: [place] CV LAB;  Service: Cardiovascular;  Laterality: Left;  . PERIPHERAL VASCULAR CATHETERIZATION N/A 01/31/2016   Procedure: Renal Angiography;  Surgeon: Lorretta Harp, MD;  Location: [place] CV LAB;  Service: Cardiovascular;  Laterality: N/A;  . PERIPHERAL VASCULAR CATHETERIZATION N/A 01/31/2016   Procedure: Abdominal Aortogram;  Surgeon: Lorretta Harp, MD;  Location: [place] CV LAB;  Service: Cardiovascular;  Laterality: N/A;  . PERIPHERAL VASCULAR CATHETERIZATION  01/31/2016   Procedure: Peripheral Vascular Intervention;  Surgeon: Lorretta Harp, MD;  Location: [place] CV LAB;  Service: Cardiovascular;;  . REDUCTION MAMMAPLASTY Bilateral 1985  . renal arteriogram  12/2014   occluded previous Lt renal stent and patent rt renal artery.  [name] RENAL ARTERY STENT Left 2012  . TONSILLECTOMY    . TUBAL LIGATION    . VAGINAL HYSTERECTOMY  ~ 1995  . WRIST FRACTURE SURGERY Left 2012    Allergies  Allergen Reactions  . Fentanyl Anaphylaxis    Cardiac arrest and coded  . Fish Allergy Anaphylaxis and Rash  . Morphine And  Related Anaphylaxis    Cardiac arrest and coded Per Dr. Conley Canal patient has taken percocet without issue  . Singulair [Montelukast] Palpitations and Other (See Comments)    Increased BP and HR  . Actonel [Risedronate Sodium] Rash and Other (See Comments)    weakness  . Dilaudid [Hydromorphone Hcl] Rash and Other (See Comments)    Crying and screaming  . Fosamax [Alendronate Sodium] Rash and Other (See Comments)    Weakness and myalgias  . Hydralazine Diarrhea and Other (See Comments)    Swelling in stomach  . Lipitor [Atorvastatin] Swelling and Rash    Tongue swelling   . Lisinopril Diarrhea, Rash and Cough  . Septra [Sulfamethoxazole-Trimethoprim] Nausea And Vomiting and Rash  . Tekturna [Aliskiren] Swelling and Rash    Swelling of tongue  . Tricor [Fenofibrate] Rash and Other (See Comments)    Flu symptoms  . Imdur [Isosorbide Dinitrate] Other (See Comments)    Headaches and  blindness  . Amlodipine Diarrhea  . Avapro [Irbesartan] Rash  . Cardio Complete [Nutritional Supplements] Rash  . Cardizem [Diltiazem Hcl] Rash  . Ciprofloxacin Rash and Other (See Comments)    Black spot on body  . Codeine Rash  . Cyclobenzaprine Nausea Only  . Forteo [Parathyroid Hormone (Recomb)] Rash  . Inapsine [Droperidol] Rash  . Ivp Dye [Iodinated Diagnostic Agents] Rash  . Shellfish Allergy Rash  . Tussionex Pennkinetic Er [Hydrocod Polst-Cpm Polst Er] Itching and Rash    Outpatient Encounter Medications as of 09/06/2019  Medication Sig  . acetaminophen (TYLENOL) 500 MG tablet Take 1,000 mg by mouth every 8 (eight) hours as needed for mild pain (for pain.).   [name] aspirin 81 MG tablet Take 1 tablet (81 mg total) by mouth daily.  [name] azelastine (ASTELIN) 0.1 % nasal spray Place 1-2 sprays into both nostrils daily as needed for rhinitis or allergies.   [name] betamethasone dipropionate (DIPROLENE) 0.05 % cream Apply 1 application topically daily as needed (skin spots).   . cefdinir (OMNICEF) 300 MG  capsule Take 300 mg by mouth 2 (two) times daily. For 5 days.  . cholecalciferol (VITAMIN D) 1000 UNITS tablet Take 2,000 Units by mouth daily.   . cloNIDine (CATAPRES) 0.1 MG tablet TAKE TWO TABLETS BY MOUTH IN THE MORNING, ONE TABLET AT 4 PM AND TWO TABLETS AT 10 PM DAILY  . clopidogrel (PLAVIX) 75 MG tablet Take 1 tablet (75 mg total) by mouth daily. Please make overdue appt for future refills. [phone]. 2nd attempt.  . denosumab (PROLIA) 60 MG/ML SOLN injection Inject 60 mg into the skin every 6 (six) months. Administer in upper arm, thigh, or abdomen  . docusate sodium (COLACE) 100 MG capsule Take 200 mg by mouth at bedtime as needed for mild constipation.   . ferrous sulfate (FERROUSUL) 325 (65 FE) MG tablet Take 1 tablet (325 mg total) by mouth as directed. Three times weekly with food  . fexofenadine (ALLEGRA) 180 MG tablet Take 180 mg by mouth daily.  . meclizine (ANTIVERT) 25 MG tablet Take 12.5 mg by mouth as needed for dizziness.  . metFORMIN (GLUCOPHAGE-XR) 500 MG 24 hr tablet Take 500 mg by mouth daily with supper.   . methimazole (TAPAZOLE) 5 MG tablet Take 5 mg by mouth daily.  . nitroGLYCERIN (NITROSTAT) 0.4 MG SL tablet Place 0.4 mg under the tongue every 5 (five) minutes as needed for chest pain (3 doses MAX).  Glory Rosebush Delica Lancets 14G MISC Use to test blood sugar twice daily. Dx: E11.9  . ONETOUCH ULTRA test strip Use to test blood sugar twice daily. Dx: E11.9  . pantoprazole (PROTONIX) 40 MG tablet Take 1 tablet (40 mg total) by mouth daily.  [name] PHENAZOPYRIDINE HCL PO Take 99.5 mg by mouth 6 (six) times daily.  Vladimir Faster Glycol-Propyl Glycol (SYSTANE FREE OP) Place 2 drops into both eyes at bedtime as needed (dry eyes).   . simvastatin (ZOCOR) 20 MG tablet Take 20 mg by mouth every Monday, Wednesday, and Friday.   . telmisartan (MICARDIS) 40 MG tablet Take 20 mg by mouth 2 (two) times daily.  . furosemide (LASIX) 20 MG tablet Take 1 tablet (20 mg total) by mouth 3  (three) times a week. Mon, Wed, and Fri only.   No facility-administered encounter medications on file as of 09/06/2019.    Review of Systems  Constitutional: Negative for appetite change, chills, fatigue and fever.  Respiratory: Negative for cough, choking, chest tightness, shortness of breath  and wheezing.   Cardiovascular: Negative for chest pain, palpitations and leg swelling.  Gastrointestinal: Negative for abdominal distention, constipation, diarrhea, nausea and vomiting.       Lower abd pain   Genitourinary: Positive for dysuria and frequency. Negative for decreased urine volume, difficulty urinating, flank pain, hematuria and urgency.  Musculoskeletal: Positive for gait problem.  Skin: Negative for color change, pallor and rash.    Immunization History  Administered Date(s) Administered  . Influenza, High Dose Seasonal PF 01/09/2017, 12/26/2017, 12/06/2018  . Influenza-Unspecified 12/14/2014, 01/03/2016, 01/09/2017  . Moderna SARS-COVID-2 Vaccination 04/18/2019, 05/16/2019  . Pneumococcal-Unspecified 12/14/2014  . Tdap 04/06/2019  . Zoster Recombinat (Shingrix) 11/12/2016, 02/03/2017   Pertinent  Health Maintenance Due  Topic Date Due  . FOOT EXAM  Never done  . OPHTHALMOLOGY EXAM  Never done  . PNA vac Low Risk Adult (2 of 2 - PCV13) 12/14/2015  . INFLUENZA VACCINE  11/13/2019  . HEMOGLOBIN A1C  02/17/2020  . DEXA SCAN  Completed   Fall Risk  09/06/2019 08/17/2019 06/01/2019 04/13/2019  Falls in the past year? 0 0 0 1  Number falls in past yr: 0 0 0 1  Injury with Fall? 0 0 - 1    Vitals:   09/06/19 1001  BP: 100/80  Pulse: 73  Resp: 16  Temp: 98.4 F (36.9 C)  SpO2: 94%  Weight: 105 lb (47.6 kg)  Height: 5' (1.524 m)   Body mass index is 20.51 kg/m. Physical Exam Constitutional:      General: She is not in acute distress.    Appearance: She is normal weight. She is not ill-appearing.  HENT:     Head: Normocephalic.  Eyes:     General: No scleral  icterus.       Right eye: No discharge.     Extraocular Movements: Extraocular movements intact.     Conjunctiva/sclera: Conjunctivae normal.     Pupils: Pupils are equal, round, and reactive to light.  Neck:     Vascular: No carotid bruit.  Cardiovascular:     Rate and Rhythm: Normal rate and regular rhythm.     Pulses: Normal pulses.     Heart sounds: Murmur present. No friction rub. No gallop.   Pulmonary:     Effort: Pulmonary effort is normal. No respiratory distress.     Breath sounds: Normal breath sounds. No wheezing, rhonchi or rales.  Chest:     Chest wall: No tenderness.  Abdominal:     General: Bowel sounds are normal. There is no distension.     Palpations: Abdomen is soft. There is no mass.     Tenderness: There is abdominal tenderness in the suprapubic area. There is no right CVA tenderness, left CVA tenderness, guarding or rebound.  Musculoskeletal:        General: No swelling or tenderness.     Cervical back: Normal range of motion. No rigidity or tenderness.     Right lower leg: No edema.     Left lower leg: No edema.     Comments: Unsteady gait ambulates with walker.  Lymphadenopathy:     Cervical: No cervical adenopathy.  Skin:    General: Skin is warm.     Coloration: Skin is not pale.     Findings: No bruising, erythema or rash.  Neurological:     Mental Status: She is alert and oriented to person, place, and time.     Cranial Nerves: No cranial nerve deficit.     Motor:  No weakness.     Gait: Gait abnormal.  Psychiatric:        Mood and Affect: Mood normal.        Behavior: Behavior normal.        Thought Content: Thought content normal.        Judgment: Judgment normal.    Labs reviewed: Recent Labs    02/16/19 0500 02/21/19 1439 03/25/19 1554 04/29/19 1415 08/17/19 1110  NA 128*   < > 130* 130* 131*  K 3.4*   < > 4.5 4.2 4.7  CL 92*   < > 91* 91* 99  CO2 23   < > _0 GLUCOSE 162*   < > 216* 198* 145*  BUN 13   < > _1 CREATININE 1.05*   < > 1.14* 1.22* 1.02*  CALCIUM 9.0   < > 9.3 9.7 9.0  MG 1.5*  --   --   --   --   PHOS 4.1  --   --   --   --    < > = values in this interval not displayed.   Recent Labs    02/15/19 1313 02/15/19 1313 02/16/19 0500 04/29/19 1415 08/17/19 1110  AST 23   < > _2 ALT 17   < > _3 ALKPHOS 68  --  67  --   --   BILITOT 0.9   < > 0.7 0.3 0.3  PROT 6.5   < > 5.7* 6.6 6.4  ALBUMIN 3.6  --  3.1*  --   --    < > = values in this interval not displayed.   Recent Labs    05/09/19 1408 06/06/19 1117 08/17/19 1110  WBC 10.4 5.6 7.7  NEUTROABS 8,372* 3,567 5,852  HGB 7.3* 8.2* 8.8*  HCT 24.0* 26.7* 27.0*  MCV 76.2* 77.4* 74.2*  PLT 498* 406* 412*   Lab Results  Component Value Date   TSH 2.11 08/17/2019   Lab Results  Component Value Date   HGBA1C 7.8 (H) 08/17/2019   Lab Results  Component Value Date   CHOL 192 08/17/2019   HDL 48 (L) 08/17/2019   LDLCALC 124 (H) 08/17/2019   TRIG 98 08/17/2019   CHOLHDL 4.0 08/17/2019    Significant Diagnostic Results in last 30 days:  No results found.  Assessment/Plan   Symptoms of urinary tract infection Afebrile.slight suprapubic tenderness on palpation with burning and dysuria symptoms.AZO tablet has helped. - continue AZO tablet x 1 more day  - POC Urinalysis Dipstick shows orange colored cloudy urine positive for protein,moderate amounts of blood,positive nitrites with small leukocytes. - advised to increase water intake to 6-8 glasses daily  - Notify provider's office if symptoms worsen or running any fever or chills. - Urine specimen send for culture will call with results.made aware that cultures takes up to three days for final results to return.verbalized understanding.   Family/ staff Communication: Reviewed plan of care with patient and Care giver verbalized understanding.   Labs/tests ordered: Urine Culture   Next Appointment: As needed if symptoms worsen or fail to resolve.    Sandrea Hughs, NP

## 2019-09-07 ENCOUNTER — Non-Acute Institutional Stay: Payer: PPO | Admitting: Internal Medicine

## 2019-09-07 ENCOUNTER — Encounter: Payer: Self-pay | Admitting: Internal Medicine

## 2019-09-07 VITALS — BP 136/80 | HR 70 | Temp 97.5°F | Ht 60.0 in | Wt 104.0 lb

## 2019-09-07 DIAGNOSIS — E785 Hyperlipidemia, unspecified: Secondary | ICD-10-CM | POA: Diagnosis not present

## 2019-09-07 DIAGNOSIS — H8110 Benign paroxysmal vertigo, unspecified ear: Secondary | ICD-10-CM

## 2019-09-07 DIAGNOSIS — D509 Iron deficiency anemia, unspecified: Secondary | ICD-10-CM | POA: Diagnosis not present

## 2019-09-07 DIAGNOSIS — R300 Dysuria: Secondary | ICD-10-CM

## 2019-09-07 DIAGNOSIS — I2510 Atherosclerotic heart disease of native coronary artery without angina pectoris: Secondary | ICD-10-CM

## 2019-09-07 DIAGNOSIS — N183 Chronic kidney disease, stage 3 unspecified: Secondary | ICD-10-CM | POA: Diagnosis not present

## 2019-09-07 DIAGNOSIS — E0590 Thyrotoxicosis, unspecified without thyrotoxic crisis or storm: Secondary | ICD-10-CM | POA: Diagnosis not present

## 2019-09-07 DIAGNOSIS — I10 Essential (primary) hypertension: Secondary | ICD-10-CM

## 2019-09-07 DIAGNOSIS — E1122 Type 2 diabetes mellitus with diabetic chronic kidney disease: Secondary | ICD-10-CM

## 2019-09-07 DIAGNOSIS — M810 Age-related osteoporosis without current pathological fracture: Secondary | ICD-10-CM | POA: Diagnosis not present

## 2019-09-07 NOTE — Progress Notes (Signed)
Location: [place] of Service:  Clinic (12)  Provider:   Code Status:  Goals of Care:  Advanced Directives 09/06/2019  Does Patient Have a Medical Advance Directive? Yes  Type of Advance Directive Living will;Healthcare Power of Attorney  Does patient want to make changes to medical advance directive? No - Patient declined  Copy of [place] in Chart? Yes - validated most recent copy scanned in chart (See row information)  Would patient like information on creating a medical advance directive? -  Pre-existing out of facility DNR order (yellow form or pink MOST form) -     Chief Complaint  Patient presents with  . Medical Management of Chronic Issues    Patient returns to the clinic for follow up. Patient complains of reoccuring UTIs and would like the antibiotic she was given from Dr. Laurann Montana.   [name] Health Maintenance    Patient is seeing an ophthamologist. No podiatrist.     HPI: Patient is a 86 y.o. female seen today for an acute visit for her Number of Problems  Dysuria Patient continues to have issues with dysuria frequency.  She said was treated initially with cefdinir.  Then she was treated with Augmentin.  She states the symptoms get better but then they come back.  She is taking AZO to help with her symptoms She said that it has happened to her before and she was treated by Dr. Laurann Montana with Cipro/Bactrim.  And she did not have any allergic reaction.  She had another culture pending from her office visit yesterday. No fever or lower abdominal pain Iron deficiency anemia Hemoglobin is improved but still low. Continues on iron Rectal exam was negative Was seen by [facility] Had EGD done.  That biopsies are negative.  He did not suggest for colonoscopy at this time BPPV Diagnosed by neurology Takes half tablet of meclizine at night and has not had any dizziness.  No recent falls Diabetes mellitus Her A1c went up from 6.8 to 7.8 She  states Accu-Cheks at home have been around 170- 180 Hypertension On a number of medication has history of renal and carotid artery stenosis CAD Is followed by Dr Salvadore Oxford showed AS and AI EF preserved BNP elevated Started on Lasix Low dose  Patient is retired Marine scientist. Lives with her husband.  Uses walker to ambulate Past Medical History:  Diagnosis Date  . BPPV (benign paroxysmal positional vertigo) 01/08/2016  . Bradycardia    a. Coreg decreased due to HR low 40s on event monitor 2017 -> further decreased due to HR upper 30s in 01/2016.  . Breast nodule 02/15/2019  . CAD (coronary artery disease)    a. RV infarct 2007 c/b high grade AV block with total occlusion of RCA, unable to treat with PCI. b. Nuc 10/2015: scar but no ischemia.  . Carotid artery disease ([place])   . Chronic anemia   . Chronic lower back pain   . CKD (chronic kidney disease), stage III   . Complication of anesthesia    "almost died when I had my breast reduction"  . Diabetes ([place]) 2002  . Dyslipidemia   . Gait abnormality 09/06/2019  . GERD (gastroesophageal reflux disease)   . Heart murmur   . High cholesterol   . History of hiatal hernia   . HTN (hypertension)   . Hyperthyroidism   . Memory problem 02/15/2019  . Meniere disease    "for years" takes Antivert 3  times per day (01/31/2016)  . Myocardial infarction [facility]) July 17, 2005   "almost died"  . Near syncope   . Osteoporosis   . Presbycusis of both ears 01/29/2016  . Renal artery stenosis ([facility])    a. left renal artery stent 2010-07-18. b. s/p repeat left renal stenting 01/2016.  . Stroke [facility])    a. Prior infarcts seen on head CT.  . TMJ pain dysfunction syndrome 08/26/2016  . Type II diabetes mellitus ([place])    "controlled w/diet and exercise only" (01/31/2016)  . Weakness 02/26/2015    Past Surgical History:  Procedure Laterality Date  . ANTERIOR CERVICAL DECOMP/DISCECTOMY FUSION  02/2002   Archie Endo 08/26/2010  . APPENDECTOMY    . BACK SURGERY    . BIOPSY   07/20/2019   Procedure: BIOPSY;  Surgeon: Otis Brace, MD;  Location: [facility];  Service: Gastroenterology;;  . BLADDER SUSPENSION  x3  . BREAST LUMPECTOMY  11/2007   breast needle-localized lumpectomy/notes 08/12/2012  . CARDIAC CATHETERIZATION  2005-07-17   "couldn't get stents in; I almost died'  . CATARACT EXTRACTION W/ INTRAOCULAR LENS  IMPLANT, BILATERAL Bilateral   . CATARACT EXTRACTION W/ INTRAOCULAR LENS  IMPLANT, BILATERAL Bilateral   . CHOLECYSTECTOMY OPEN    . ESOPHAGOGASTRODUODENOSCOPY (EGD) WITH PROPOFOL N/A 07/20/2019   Procedure: ESOPHAGOGASTRODUODENOSCOPY (EGD) WITH PROPOFOL;  Surgeon: Otis Brace, MD;  Location: [facility];  Service: Gastroenterology;  Laterality: N/A;  . FRACTURE SURGERY    . HIP FRACTURE SURGERY Right 07-17-09   "added rod in my leg and a ball"  . KYPHOPLASTY N/A 03/20/2015   Procedure: THORACIC TWELVE KYPHOPLASTY;  Surgeon: Jovita Gamma, MD;  Location: [place] NEURO ORS;  Service: Neurosurgery;  Laterality: N/A;  . KYPHOPLASTY N/A 04/23/2015   Procedure: KYPHOPLASTY - LUMBAR ONE;  Surgeon: Jovita Gamma, MD;  Location: [place] NEUR[facility] ORS;  Service: Neurosurgery;  Laterality: N/A;  L1 Kyphoplasty  . NECK HARDWARE REMOVAL  11/2002   "replaced screw"  . PERIPHERAL VASCULAR CATHETERIZATION Left 12/20/2014   Procedure: Renal Angiography;  Surgeon: Wellington Hampshire, MD;  Location: [place] CV LAB;  Service: Cardiovascular;  Laterality: Left;  . PERIPHERAL VASCULAR CATHETERIZATION N/A 01/31/2016   Procedure: Renal Angiography;  Surgeon: Lorretta Harp, MD;  Location: [place] CV LAB;  Service: Cardiovascular;  Laterality: N/A;  . PERIPHERAL VASCULAR CATHETERIZATION N/A 01/31/2016   Procedure: Abdominal Aortogram;  Surgeon: Lorretta Harp, MD;  Location: [place] CV LAB;  Service: Cardiovascular;  Laterality: N/A;  . PERIPHERAL VASCULAR CATHETERIZATION  01/31/2016   Procedure: Peripheral Vascular Intervention;  Surgeon: Lorretta Harp, MD;  Location: [place] CV LAB;  Service: Cardiovascular;;  . REDUCTION MAMMAPLASTY Bilateral 1985  . renal arteriogram  12/2014   occluded previous Lt renal stent and patent rt renal artery.  [name] RENAL ARTERY STENT Left 18-Jul-2010  . TONSILLECTOMY    . TUBAL LIGATION    . VAGINAL HYSTERECTOMY  ~ 1995  . WRIST FRACTURE SURGERY Left 07-18-10    Allergies  Allergen Reactions  . Fentanyl Anaphylaxis    Cardiac arrest and coded  . Fish Allergy Anaphylaxis and Rash  . Morphine And Related Anaphylaxis    Cardiac arrest and coded Per Dr. Conley Canal patient has taken percocet without issue  . Singulair [Montelukast] Palpitations and Other (See Comments)    Increased BP and HR  . Actonel [Risedronate Sodium] Rash and Other (See Comments)    weakness  . Dilaudid [Hydromorphone Hcl] Rash and Other (See Comments)    Crying and  screaming  . Fosamax [Alendronate Sodium] Rash and Other (See Comments)    Weakness and myalgias  . Hydralazine Diarrhea and Other (See Comments)    Swelling in stomach  . Lipitor [Atorvastatin] Swelling and Rash    Tongue swelling   . Lisinopril Diarrhea, Rash and Cough  . Septra [Sulfamethoxazole-Trimethoprim] Nausea And Vomiting and Rash  . Tekturna [Aliskiren] Swelling and Rash    Swelling of tongue  . Tricor [Fenofibrate] Rash and Other (See Comments)    Flu symptoms  . Imdur [Isosorbide Dinitrate] Other (See Comments)    Headaches and blindness  . Amlodipine Diarrhea  . Avapro [Irbesartan] Rash  . Cardio Complete [Nutritional Supplements] Rash  . Cardizem [Diltiazem Hcl] Rash  . Ciprofloxacin Rash and Other (See Comments)    Black spot on body  . Codeine Rash  . Cyclobenzaprine Nausea Only  . Forteo [Parathyroid Hormone (Recomb)] Rash  . Inapsine [Droperidol] Rash  . Ivp Dye [Iodinated Diagnostic Agents] Rash  . Shellfish Allergy Rash  . Tussionex Pennkinetic Er [Hydrocod Polst-Cpm Polst Er] Itching and Rash    Outpatient Encounter Medications as of 09/07/2019  Medication  Sig  . acetaminophen (TYLENOL) 500 MG tablet Take 1,000 mg by mouth every 8 (eight) hours as needed for mild pain (for pain.).   [name] aspirin 81 MG tablet Take 1 tablet (81 mg total) by mouth daily.  [name] azelastine (ASTELIN) 0.1 % nasal spray Place 1-2 sprays into both nostrils daily as needed for rhinitis or allergies.   [name] betamethasone dipropionate (DIPROLENE) 0.05 % cream Apply 1 application topically daily as needed (skin spots).   . cefdinir (OMNICEF) 300 MG capsule Take 300 mg by mouth 2 (two) times daily. For 5 days.  . cholecalciferol (VITAMIN D) 1000 UNITS tablet Take 2,000 Units by mouth daily.   . cloNIDine (CATAPRES) 0.1 MG tablet TAKE TWO TABLETS BY MOUTH IN THE MORNING, ONE TABLET AT 4 PM AND TWO TABLETS AT 10 PM DAILY  . clopidogrel (PLAVIX) 75 MG tablet Take 1 tablet (75 mg total) by mouth daily. Please make overdue appt for future refills. [phone]. 2nd attempt.  . denosumab (PROLIA) 60 MG/ML SOLN injection Inject 60 mg into the skin every 6 (six) months. Administer in upper arm, thigh, or abdomen  . docusate sodium (COLACE) 100 MG capsule Take 200 mg by mouth at bedtime as needed for mild constipation.   . ferrous sulfate (FERROUSUL) 325 (65 FE) MG tablet Take 1 tablet (325 mg total) by mouth as directed. Three times weekly with food  . fexofenadine (ALLEGRA) 180 MG tablet Take 180 mg by mouth daily.  . meclizine (ANTIVERT) 25 MG tablet Take 12.5 mg by mouth as needed for dizziness.  . metFORMIN (GLUCOPHAGE-XR) 500 MG 24 hr tablet Take 500 mg by mouth daily with supper.   . methimazole (TAPAZOLE) 5 MG tablet Take 5 mg by mouth daily.  . nitroGLYCERIN (NITROSTAT) 0.4 MG SL tablet Place 0.4 mg under the tongue every 5 (five) minutes as needed for chest pain (3 doses MAX).  Glory Rosebush Delica Lancets [id number] MISC Use to test blood sugar twice daily. Dx: E11.9  . ONETOUCH ULTRA test strip Use to test blood sugar twice daily. Dx: E11.9  . pantoprazole (PROTONIX) 40 MG tablet Take 1 tablet  (40 mg total) by mouth daily.  [name] PHENAZOPYRIDINE HCL PO Take 99.5 mg by mouth 6 (six) times daily.  Vladimir Faster Glycol-Propyl Glycol (SYSTANE FREE OP) Place 2 drops into both eyes at bedtime as needed (  dry eyes).   . simvastatin (ZOCOR) 20 MG tablet Take 20 mg by mouth every Monday, Wednesday, and Friday.   . telmisartan (MICARDIS) 40 MG tablet Take 20 mg by mouth 2 (two) times daily.  . furosemide (LASIX) 20 MG tablet Take 1 tablet (20 mg total) by mouth 3 (three) times a week. Mon, Wed, and Fri only.   No facility-administered encounter medications on file as of 09/07/2019.    Review of Systems:  Review of Systems  Constitutional: Negative.   HENT: Negative.   Respiratory: Negative.   Cardiovascular: Negative.   Gastrointestinal: Negative.   Genitourinary: Positive for dysuria, frequency and urgency.  Musculoskeletal: Positive for gait problem.  Skin: Negative.   Neurological: Positive for weakness.  Psychiatric/Behavioral: Negative.     Health Maintenance  Topic Date Due  . FOOT EXAM  Never done  . OPHTHALMOLOGY EXAM  Never done  . PNA vac Low Risk Adult (2 of 2 - PCV13) 12/14/2015  . INFLUENZA VACCINE  11/13/2019  . HEMOGLOBIN A1C  02/17/2020  . TETANUS/TDAP  04/05/2029  . DEXA SCAN  Completed  . COVID-19 Vaccine  Completed    Physical Exam: Vitals:   09/07/19 1537  BP: 136/80  Pulse: 70  Temp: (!) 97.5 F (36.4 C)  SpO2: 93%  Weight: 104 lb (47.2 kg)  Height: 5' (1.524 m)   Body mass index is 20.31 kg/m. Physical Exam  Constitutional: Oriented to person, place, and time. Well-developed and well-nourished.  HENT:  Head: Normocephalic.  Mouth/Throat: Oropharynx is clear and moist.  Eyes: Pupils are equal, round, and reactive to light.  Neck: Neck supple.  Cardiovascular: Normal rate and normal heart sounds.  No murmur heard. Pulmonary/Chest: Effort normal and breath sounds normal. No respiratory distress. No wheezes. She has no rales.  Abdominal: Soft.  Bowel sounds are normal. No distension. There is no tenderness. There is no rebound.  Musculoskeletal: Mild edema bilateral Lymphadenopathy: none Neurological: Alert and oriented to person, place, and time. Stands by herself but walks with the walker  Skin: Skin is warm and dry.  Psychiatric: Normal mood and affect. Behavior is normal. Thought content normal.    Labs reviewed: Basic Metabolic Panel: Recent Labs    02/15/19 1313 02/15/19 1945 02/16/19 0500 02/21/19 1439 03/25/19 1554 04/29/19 1415 08/17/19 1110  NA   < >  --  128*   < > 130* 130* 131*  K   < >  --  3.4*   < > 4.5 4.2 4.7  CL   < >  --  92*   < > 91* 91* 99  CO2   < >  --  23   < > 26 25 23   GLUCOSE   < >  --  162*   < > 216* 198* 145*  BUN   < >  --  13   < > 20 18 18   CREATININE   < >  --  1.05*   < > 1.14* 1.22* 1.02*  CALCIUM   < >  --  9.0   < > 9.3 9.7 9.0  MG  --   --  1.5*  --   --   --   --   PHOS  --   --  4.1  --   --   --   --   TSH  --  2.464  --   --   --  3.01 2.11   < > = values in this interval not  displayed.   Liver Function Tests: Recent Labs    02/15/19 1313 02/15/19 1313 02/16/19 0500 04/29/19 1415 08/17/19 1110  AST 23   < > 19 16 17   ALT 17   < > 15 12 14   ALKPHOS 68  --  67  --   --   BILITOT 0.9   < > 0.7 0.3 0.3  PROT 6.5   < > 5.7* 6.6 6.4  ALBUMIN 3.6  --  3.1*  --   --    < > = values in this interval not displayed.   No results for input(s): LIPASE, AMYLASE in the last 8760 hours. No results for input(s): AMMONIA in the last 8760 hours. CBC: Recent Labs    05/09/19 1408 06/06/19 1117 08/17/19 1110  WBC 10.4 5.6 7.7  NEUTROABS 8,372* 3,567 5,852  HGB 7.3* 8.2* 8.8*  HCT 24.0* 26.7* 27.0*  MCV 76.2* 77.4* 74.2*  PLT 498* 406* 412*   Lipid Panel: Recent Labs    04/29/19 1415 08/17/19 1110  CHOL 187 192  HDL 56 48*  LDLCALC 102* 124*  TRIG 175* 98  CHOLHDL 3.3 4.0   Lab Results  Component Value Date   HGBA1C 7.8 (H) 08/17/2019    Procedures since  last visit: No results found.  Assessment/Plan Dysuria Repeat UA and culture pending Patient has taken Cipro before We will consider that depending on the culture   Iron deficiency anemia, unspecified iron deficiency anemia type On iron Has been heme-negative Was seen by GI.  They did EGD on her  Essential hypertension Blood pressure controlled on clonidine and Micardis   Coronary artery disease Continue on Plavix statin and aspirin  LE edema with AS and AI On Lasix 3 times a week   Benign paroxysmal positional vertigo, unspecified laterality Seems to be doing well on low-dose of meclizine   Hyperthyroidism TSH and T3-4 normal On Tapazole  Age-related osteoporosis without current pathological fracture On Prolia Needs Repeat DEXA Vit D Level  Type 2 diabetes mellitus with stage 3 chronic kidney disease, without long-term current use of insulin, unspecified whether stage 3a or 3b CKD ([place])  On Metformin A1c went up.  Possibly due to her recurrent UTIs Discussed with her caregiver today going to check her sugars Follow diet Repeat A1c in 3 months   Hyperlipidemia, unspecified hyperlipidemia type LDL has gone up. We will consider going up on her Zocor FU Lipid again  Allergic Rhinitis On Allegra, Astelin     Labs/tests ordered:  * No order type specified * Next appt:  11/30/2019

## 2019-09-08 LAB — URINE CULTURE
MICRO NUMBER:: 10521060
SPECIMEN QUALITY:: ADEQUATE

## 2019-09-09 ENCOUNTER — Telehealth: Payer: Self-pay | Admitting: Internal Medicine

## 2019-09-09 ENCOUNTER — Telehealth: Payer: Self-pay

## 2019-09-09 ENCOUNTER — Other Ambulatory Visit: Payer: Self-pay | Admitting: Internal Medicine

## 2019-09-09 MED ORDER — CEPHALEXIN 500 MG PO CAPS: 500.0000 mg | ORAL_CAPSULE | Freq: Two times a day (BID) | ORAL | 0 refills | Status: AC

## 2019-09-09 NOTE — Telephone Encounter (Signed)
Antibiotics called

## 2019-09-09 NOTE — Progress Notes (Signed)
Patient has not responded to 5 days of Cefdinirand 7 days of Augmentin I reviewed her records from Dr Laurann Montana and she did respond to Cephalexin. Will try that for 10 days. If no Improvement she has to see Urology.

## 2019-09-09 NOTE — Telephone Encounter (Signed)
Marko Stai patient's caregiver called.  They are still needing the results of the lab work.  She stated that patient is not feeling well and they are concerned.  Please call Juliann Pulse asap today at [phone].  Thank you  Adan Sis

## 2019-09-09 NOTE — Telephone Encounter (Signed)
I have called in her Antibiotics for her UTI. Let her Know

## 2019-09-09 NOTE — Telephone Encounter (Signed)
Discussed response with Juliann Pulse. Juliann Pulse plans to get antibiotic and start ASAP

## 2019-09-09 NOTE — Telephone Encounter (Signed)
Marko Stai called on patients behalf requesting results of urine culture. Juliann Pulse mentioned that she is surprised that they had not heard about results by now.  I called patient to get confirmation that it is ok to speak with Marko Stai for she is not listed on her DPR. Patient was using the bathroom at the time of call and her husband gave the verbal ok to share information with Juliann Pulse and stated she is their caregiver.   Please advise

## 2019-09-09 NOTE — Telephone Encounter (Signed)
Results came in today and I forward results to Dr.Gupta.she had request to handle patient's results.

## 2019-09-09 NOTE — Telephone Encounter (Signed)
See other phone encounter dated for 09/09/19.   [name]

## 2019-09-14 ENCOUNTER — Telehealth: Payer: Self-pay

## 2019-09-14 DIAGNOSIS — R300 Dysuria: Secondary | ICD-10-CM

## 2019-09-14 NOTE — Telephone Encounter (Signed)
Patient caregiver called back and notified of referral to Urology. Patient agrees to go. Caregiver also states that URI was a miscommunication issue and she didn't mean to state that.

## 2019-09-14 NOTE — Telephone Encounter (Signed)
Her URI symptoms are new. But for UTI we need to refer her to Urology for recurent Symptoms.

## 2019-09-14 NOTE — Addendum Note (Signed)
Addended by: Georgina Snell on: 09/14/2019 11:39 AM   Modules accepted: Orders

## 2019-09-14 NOTE — Telephone Encounter (Signed)
Message routed to referral coordinator.

## 2019-09-14 NOTE — Telephone Encounter (Signed)
Patient caretaker [name] called and states that patient is still having UTI symptoms and respiratory issues are still present. She want's to know what are the next steps to take. Pleaser Advise.

## 2019-09-14 NOTE — Telephone Encounter (Signed)
I will place the referal

## 2019-09-16 ENCOUNTER — Encounter: Payer: Self-pay | Admitting: Internal Medicine

## 2019-09-19 ENCOUNTER — Emergency Department (HOSPITAL_COMMUNITY): Payer: PPO

## 2019-09-19 ENCOUNTER — Other Ambulatory Visit: Payer: Self-pay

## 2019-09-19 ENCOUNTER — Encounter (HOSPITAL_COMMUNITY): Payer: Self-pay | Admitting: Emergency Medicine

## 2019-09-19 ENCOUNTER — Emergency Department (HOSPITAL_COMMUNITY)
Admission: EM | Admit: 2019-09-19 | Discharge: 2019-09-19 | Disposition: A | Discharge status: Home / Self Care | Payer: PPO | Attending: Emergency Medicine | Admitting: Emergency Medicine

## 2019-09-19 DIAGNOSIS — Z5321 Procedure and treatment not carried out due to patient leaving prior to being seen by health care provider: Secondary | ICD-10-CM | POA: Diagnosis not present

## 2019-09-19 DIAGNOSIS — I499 Cardiac arrhythmia, unspecified: Secondary | ICD-10-CM | POA: Diagnosis not present

## 2019-09-19 DIAGNOSIS — R079 Chest pain, unspecified: Secondary | ICD-10-CM | POA: Diagnosis not present

## 2019-09-19 DIAGNOSIS — I959 Hypotension, unspecified: Secondary | ICD-10-CM | POA: Diagnosis not present

## 2019-09-19 DIAGNOSIS — R0789 Other chest pain: Secondary | ICD-10-CM | POA: Diagnosis not present

## 2019-09-19 LAB — CBC
HCT: 31.4 % — ABNORMAL LOW (ref 36.0–46.0)
Hemoglobin: 9.4 g/dL — ABNORMAL LOW (ref 12.0–15.0)
MCH: 24.5 pg — ABNORMAL LOW (ref 26.0–34.0)
MCHC: 29.9 g/dL — ABNORMAL LOW (ref 30.0–36.0)
MCV: 82 fL (ref 80.0–100.0)
Platelets: 374 10*3/uL (ref 150–400)
RBC: 3.83 MIL/uL — ABNORMAL LOW (ref 3.87–5.11)
RDW: 18.7 % — ABNORMAL HIGH (ref 11.5–15.5)
WBC: 7.9 10*3/uL (ref 4.0–10.5)
nRBC: 0 % (ref 0.0–0.2)

## 2019-09-19 LAB — BASIC METABOLIC PANEL
Anion gap: 12 (ref 5–15)
BUN: 20 mg/dL (ref 8–23)
CO2: 20 mmol/L — ABNORMAL LOW (ref 22–32)
Calcium: 8.7 mg/dL — ABNORMAL LOW (ref 8.9–10.3)
Chloride: 94 mmol/L — ABNORMAL LOW (ref 98–111)
Creatinine, Ser: 1.14 mg/dL — ABNORMAL HIGH (ref 0.44–1.00)
GFR calc Af Amer: 51 mL/min — ABNORMAL LOW (ref >60)
GFR calc non Af Amer: 44 mL/min — ABNORMAL LOW (ref >60)
Glucose, Bld: 213 mg/dL — ABNORMAL HIGH (ref 70–99)
Potassium: 4.5 mmol/L (ref 3.5–5.1)
Sodium: 126 mmol/L — ABNORMAL LOW (ref 135–145)

## 2019-09-19 LAB — TROPONIN I (HIGH SENSITIVITY): Troponin I (High Sensitivity): 24 ng/L — ABNORMAL HIGH (ref <18)

## 2019-09-19 MED ORDER — SODIUM CHLORIDE 0.9% FLUSH: 3.0000 mL | Freq: Once | INTRAVENOUS | Status: DC

## 2019-09-19 NOTE — ED Notes (Signed)
Pt told their visitor that they were ready to leave. Staff took out the pt's IV.

## 2019-09-19 NOTE — ED Triage Notes (Addendum)
Patient arrives to ED with [facility] with reports of chest pressure starting this morning. Patient states that it feels like her heart is racing like when shes in [place]. Patient denies SOB, N/V, and currently 0/10 chest pain. EMS gave 324 ASA.

## 2019-09-21 DIAGNOSIS — Z85828 Personal history of other malignant neoplasm of skin: Secondary | ICD-10-CM | POA: Diagnosis not present

## 2019-09-21 DIAGNOSIS — C44311 Basal cell carcinoma of skin of nose: Secondary | ICD-10-CM | POA: Diagnosis not present

## 2019-10-14 DIAGNOSIS — E118 Type 2 diabetes mellitus with unspecified complications: Secondary | ICD-10-CM | POA: Diagnosis not present

## 2019-10-14 DIAGNOSIS — M6281 Muscle weakness (generalized): Secondary | ICD-10-CM | POA: Diagnosis not present

## 2019-10-14 DIAGNOSIS — I10 Essential (primary) hypertension: Secondary | ICD-10-CM | POA: Diagnosis not present

## 2019-10-14 DIAGNOSIS — R2681 Unsteadiness on feet: Secondary | ICD-10-CM | POA: Diagnosis not present

## 2019-10-14 DIAGNOSIS — Z9181 History of falling: Secondary | ICD-10-CM | POA: Diagnosis not present

## 2019-10-14 DIAGNOSIS — K219 Gastro-esophageal reflux disease without esophagitis: Secondary | ICD-10-CM | POA: Diagnosis not present

## 2019-10-14 DIAGNOSIS — M818 Other osteoporosis without current pathological fracture: Secondary | ICD-10-CM | POA: Diagnosis not present

## 2019-10-14 DIAGNOSIS — H2521 Age-related cataract, morgagnian type, right eye: Secondary | ICD-10-CM | POA: Diagnosis not present

## 2019-11-07 DIAGNOSIS — N8111 Cystocele, midline: Secondary | ICD-10-CM | POA: Diagnosis not present

## 2019-11-07 DIAGNOSIS — N3000 Acute cystitis without hematuria: Secondary | ICD-10-CM | POA: Diagnosis not present

## 2019-11-15 DIAGNOSIS — R2681 Unsteadiness on feet: Secondary | ICD-10-CM | POA: Diagnosis not present

## 2019-11-15 DIAGNOSIS — M818 Other osteoporosis without current pathological fracture: Secondary | ICD-10-CM | POA: Diagnosis not present

## 2019-11-15 DIAGNOSIS — Z9181 History of falling: Secondary | ICD-10-CM | POA: Diagnosis not present

## 2019-11-15 DIAGNOSIS — K219 Gastro-esophageal reflux disease without esophagitis: Secondary | ICD-10-CM | POA: Diagnosis not present

## 2019-11-15 DIAGNOSIS — M6281 Muscle weakness (generalized): Secondary | ICD-10-CM | POA: Diagnosis not present

## 2019-11-15 DIAGNOSIS — I10 Essential (primary) hypertension: Secondary | ICD-10-CM | POA: Diagnosis not present

## 2019-11-15 DIAGNOSIS — H2521 Age-related cataract, morgagnian type, right eye: Secondary | ICD-10-CM | POA: Diagnosis not present

## 2019-11-15 DIAGNOSIS — E118 Type 2 diabetes mellitus with unspecified complications: Secondary | ICD-10-CM | POA: Diagnosis not present

## 2019-11-21 ENCOUNTER — Other Ambulatory Visit: Payer: Self-pay

## 2019-11-21 DIAGNOSIS — E785 Hyperlipidemia, unspecified: Secondary | ICD-10-CM

## 2019-11-21 DIAGNOSIS — E1122 Type 2 diabetes mellitus with diabetic chronic kidney disease: Secondary | ICD-10-CM

## 2019-11-21 DIAGNOSIS — D509 Iron deficiency anemia, unspecified: Secondary | ICD-10-CM

## 2019-11-22 ENCOUNTER — Telehealth: Payer: Self-pay | Admitting: Neurology

## 2019-11-22 ENCOUNTER — Emergency Department (HOSPITAL_COMMUNITY): Payer: PPO

## 2019-11-22 ENCOUNTER — Other Ambulatory Visit: Payer: Self-pay

## 2019-11-22 ENCOUNTER — Encounter: Payer: Self-pay | Admitting: Nurse Practitioner

## 2019-11-22 ENCOUNTER — Inpatient Hospital Stay (HOSPITAL_COMMUNITY)
Admission: EM | Admit: 2019-11-22 | Discharge: 2019-12-08 | DRG: 082 | Disposition: A | Discharge status: Hospice | Payer: PPO | Attending: Neurological Surgery | Admitting: Neurological Surgery

## 2019-11-22 ENCOUNTER — Encounter: Payer: PPO | Admitting: Nurse Practitioner

## 2019-11-22 ENCOUNTER — Encounter (HOSPITAL_COMMUNITY): Payer: Self-pay | Admitting: Emergency Medicine

## 2019-11-22 DIAGNOSIS — G9349 Other encephalopathy: Secondary | ICD-10-CM | POA: Diagnosis not present

## 2019-11-22 DIAGNOSIS — E119 Type 2 diabetes mellitus without complications: Secondary | ICD-10-CM

## 2019-11-22 DIAGNOSIS — Z7189 Other specified counseling: Secondary | ICD-10-CM

## 2019-11-22 DIAGNOSIS — I350 Nonrheumatic aortic (valve) stenosis: Secondary | ICD-10-CM | POA: Diagnosis present

## 2019-11-22 DIAGNOSIS — E639 Nutritional deficiency, unspecified: Secondary | ICD-10-CM | POA: Diagnosis not present

## 2019-11-22 DIAGNOSIS — M810 Age-related osteoporosis without current pathological fracture: Secondary | ICD-10-CM | POA: Diagnosis present

## 2019-11-22 DIAGNOSIS — S065XAA Traumatic subdural hemorrhage with loss of consciousness status unknown, initial encounter: Secondary | ICD-10-CM | POA: Diagnosis present

## 2019-11-22 DIAGNOSIS — I6389 Other cerebral infarction: Secondary | ICD-10-CM | POA: Diagnosis not present

## 2019-11-22 DIAGNOSIS — M545 Low back pain: Secondary | ICD-10-CM | POA: Diagnosis present

## 2019-11-22 DIAGNOSIS — I6201 Nontraumatic acute subdural hemorrhage: Secondary | ICD-10-CM | POA: Diagnosis not present

## 2019-11-22 DIAGNOSIS — A419 Sepsis, unspecified organism: Secondary | ICD-10-CM | POA: Diagnosis not present

## 2019-11-22 DIAGNOSIS — R627 Adult failure to thrive: Secondary | ICD-10-CM | POA: Diagnosis present

## 2019-11-22 DIAGNOSIS — J9811 Atelectasis: Secondary | ICD-10-CM | POA: Diagnosis not present

## 2019-11-22 DIAGNOSIS — R0902 Hypoxemia: Secondary | ICD-10-CM | POA: Diagnosis not present

## 2019-11-22 DIAGNOSIS — R4701 Aphasia: Secondary | ICD-10-CM | POA: Diagnosis not present

## 2019-11-22 DIAGNOSIS — N183 Chronic kidney disease, stage 3 unspecified: Secondary | ICD-10-CM | POA: Diagnosis present

## 2019-11-22 DIAGNOSIS — Z823 Family history of stroke: Secondary | ICD-10-CM

## 2019-11-22 DIAGNOSIS — I252 Old myocardial infarction: Secondary | ICD-10-CM

## 2019-11-22 DIAGNOSIS — Z888 Allergy status to other drugs, medicaments and biological substances status: Secondary | ICD-10-CM

## 2019-11-22 DIAGNOSIS — E1122 Type 2 diabetes mellitus with diabetic chronic kidney disease: Secondary | ICD-10-CM | POA: Diagnosis present

## 2019-11-22 DIAGNOSIS — Z841 Family history of disorders of kidney and ureter: Secondary | ICD-10-CM

## 2019-11-22 DIAGNOSIS — R1310 Dysphagia, unspecified: Secondary | ICD-10-CM | POA: Diagnosis present

## 2019-11-22 DIAGNOSIS — Y92099 Unspecified place in other non-institutional residence as the place of occurrence of the external cause: Secondary | ICD-10-CM

## 2019-11-22 DIAGNOSIS — I130 Hypertensive heart and chronic kidney disease with heart failure and stage 1 through stage 4 chronic kidney disease, or unspecified chronic kidney disease: Secondary | ICD-10-CM | POA: Diagnosis not present

## 2019-11-22 DIAGNOSIS — J690 Pneumonitis due to inhalation of food and vomit: Secondary | ICD-10-CM | POA: Diagnosis not present

## 2019-11-22 DIAGNOSIS — G40901 Epilepsy, unspecified, not intractable, with status epilepticus: Secondary | ICD-10-CM | POA: Diagnosis present

## 2019-11-22 DIAGNOSIS — Z7902 Long term (current) use of antithrombotics/antiplatelets: Secondary | ICD-10-CM

## 2019-11-22 DIAGNOSIS — I10 Essential (primary) hypertension: Secondary | ICD-10-CM | POA: Diagnosis not present

## 2019-11-22 DIAGNOSIS — Z7982 Long term (current) use of aspirin: Secondary | ICD-10-CM

## 2019-11-22 DIAGNOSIS — I6522 Occlusion and stenosis of left carotid artery: Secondary | ICD-10-CM | POA: Diagnosis present

## 2019-11-22 DIAGNOSIS — E1165 Type 2 diabetes mellitus with hyperglycemia: Secondary | ICD-10-CM | POA: Diagnosis not present

## 2019-11-22 DIAGNOSIS — R0600 Dyspnea, unspecified: Secondary | ICD-10-CM | POA: Diagnosis not present

## 2019-11-22 DIAGNOSIS — Z20822 Contact with and (suspected) exposure to covid-19: Secondary | ICD-10-CM | POA: Diagnosis present

## 2019-11-22 DIAGNOSIS — G935 Compression of brain: Secondary | ICD-10-CM | POA: Diagnosis present

## 2019-11-22 DIAGNOSIS — Z7984 Long term (current) use of oral hypoglycemic drugs: Secondary | ICD-10-CM

## 2019-11-22 DIAGNOSIS — Z931 Gastrostomy status: Secondary | ICD-10-CM

## 2019-11-22 DIAGNOSIS — S065X9A Traumatic subdural hemorrhage with loss of consciousness of unspecified duration, initial encounter: Secondary | ICD-10-CM | POA: Diagnosis not present

## 2019-11-22 DIAGNOSIS — G8929 Other chronic pain: Secondary | ICD-10-CM | POA: Diagnosis present

## 2019-11-22 DIAGNOSIS — I517 Cardiomegaly: Secondary | ICD-10-CM | POA: Diagnosis not present

## 2019-11-22 DIAGNOSIS — Z90710 Acquired absence of both cervix and uterus: Secondary | ICD-10-CM

## 2019-11-22 DIAGNOSIS — R404 Transient alteration of awareness: Secondary | ICD-10-CM | POA: Diagnosis not present

## 2019-11-22 DIAGNOSIS — Z820 Family history of epilepsy and other diseases of the nervous system: Secondary | ICD-10-CM

## 2019-11-22 DIAGNOSIS — Z8673 Personal history of transient ischemic attack (TIA), and cerebral infarction without residual deficits: Secondary | ICD-10-CM

## 2019-11-22 DIAGNOSIS — R296 Repeated falls: Secondary | ICD-10-CM | POA: Diagnosis present

## 2019-11-22 DIAGNOSIS — I2582 Chronic total occlusion of coronary artery: Secondary | ICD-10-CM | POA: Diagnosis not present

## 2019-11-22 DIAGNOSIS — Z66 Do not resuscitate: Secondary | ICD-10-CM | POA: Diagnosis not present

## 2019-11-22 DIAGNOSIS — D631 Anemia in chronic kidney disease: Secondary | ICD-10-CM | POA: Diagnosis not present

## 2019-11-22 DIAGNOSIS — E785 Hyperlipidemia, unspecified: Secondary | ICD-10-CM | POA: Diagnosis present

## 2019-11-22 DIAGNOSIS — K219 Gastro-esophageal reflux disease without esophagitis: Secondary | ICD-10-CM | POA: Diagnosis present

## 2019-11-22 DIAGNOSIS — Z885 Allergy status to narcotic agent status: Secondary | ICD-10-CM

## 2019-11-22 DIAGNOSIS — S065X0A Traumatic subdural hemorrhage without loss of consciousness, initial encounter: Secondary | ICD-10-CM | POA: Diagnosis not present

## 2019-11-22 DIAGNOSIS — Z01818 Encounter for other preprocedural examination: Secondary | ICD-10-CM | POA: Diagnosis not present

## 2019-11-22 DIAGNOSIS — R064 Hyperventilation: Secondary | ICD-10-CM

## 2019-11-22 DIAGNOSIS — I701 Atherosclerosis of renal artery: Secondary | ICD-10-CM | POA: Diagnosis present

## 2019-11-22 DIAGNOSIS — R569 Unspecified convulsions: Secondary | ICD-10-CM | POA: Diagnosis not present

## 2019-11-22 DIAGNOSIS — R4182 Altered mental status, unspecified: Secondary | ICD-10-CM | POA: Diagnosis not present

## 2019-11-22 DIAGNOSIS — D72829 Elevated white blood cell count, unspecified: Secondary | ICD-10-CM

## 2019-11-22 DIAGNOSIS — Z515 Encounter for palliative care: Secondary | ICD-10-CM | POA: Diagnosis not present

## 2019-11-22 DIAGNOSIS — Z79899 Other long term (current) drug therapy: Secondary | ICD-10-CM

## 2019-11-22 DIAGNOSIS — E278 Other specified disorders of adrenal gland: Secondary | ICD-10-CM | POA: Diagnosis not present

## 2019-11-22 DIAGNOSIS — E875 Hyperkalemia: Secondary | ICD-10-CM | POA: Diagnosis present

## 2019-11-22 DIAGNOSIS — E43 Unspecified severe protein-calorie malnutrition: Secondary | ICD-10-CM | POA: Diagnosis not present

## 2019-11-22 DIAGNOSIS — R410 Disorientation, unspecified: Secondary | ICD-10-CM | POA: Diagnosis not present

## 2019-11-22 DIAGNOSIS — Z833 Family history of diabetes mellitus: Secondary | ICD-10-CM

## 2019-11-22 DIAGNOSIS — G319 Degenerative disease of nervous system, unspecified: Secondary | ICD-10-CM | POA: Diagnosis not present

## 2019-11-22 DIAGNOSIS — I6200 Nontraumatic subdural hemorrhage, unspecified: Secondary | ICD-10-CM

## 2019-11-22 DIAGNOSIS — G9389 Other specified disorders of brain: Secondary | ICD-10-CM | POA: Diagnosis not present

## 2019-11-22 DIAGNOSIS — Z8249 Family history of ischemic heart disease and other diseases of the circulatory system: Secondary | ICD-10-CM

## 2019-11-22 DIAGNOSIS — N1831 Chronic kidney disease, stage 3a: Secondary | ICD-10-CM | POA: Diagnosis not present

## 2019-11-22 DIAGNOSIS — Z87891 Personal history of nicotine dependence: Secondary | ICD-10-CM

## 2019-11-22 DIAGNOSIS — I2510 Atherosclerotic heart disease of native coronary artery without angina pectoris: Secondary | ICD-10-CM | POA: Diagnosis present

## 2019-11-22 DIAGNOSIS — Z95820 Peripheral vascular angioplasty status with implants and grafts: Secondary | ICD-10-CM

## 2019-11-22 DIAGNOSIS — R001 Bradycardia, unspecified: Secondary | ICD-10-CM | POA: Diagnosis not present

## 2019-11-22 DIAGNOSIS — W1830XA Fall on same level, unspecified, initial encounter: Secondary | ICD-10-CM | POA: Diagnosis present

## 2019-11-22 DIAGNOSIS — E7800 Pure hypercholesterolemia, unspecified: Secondary | ICD-10-CM | POA: Diagnosis present

## 2019-11-22 DIAGNOSIS — F0390 Unspecified dementia without behavioral disturbance: Secondary | ICD-10-CM | POA: Diagnosis not present

## 2019-11-22 DIAGNOSIS — I700 Atherosclerosis of aorta: Secondary | ICD-10-CM | POA: Diagnosis not present

## 2019-11-22 DIAGNOSIS — R6251 Failure to thrive (child): Secondary | ICD-10-CM

## 2019-11-22 DIAGNOSIS — I5032 Chronic diastolic (congestive) heart failure: Secondary | ICD-10-CM | POA: Diagnosis not present

## 2019-11-22 DIAGNOSIS — Z981 Arthrodesis status: Secondary | ICD-10-CM

## 2019-11-22 DIAGNOSIS — K3189 Other diseases of stomach and duodenum: Secondary | ICD-10-CM | POA: Diagnosis not present

## 2019-11-22 LAB — COMPREHENSIVE METABOLIC PANEL
ALT: 14 U/L (ref 0–44)
AST: 21 U/L (ref 15–41)
Albumin: 3.8 g/dL (ref 3.5–5.0)
Alkaline Phosphatase: 57 U/L (ref 38–126)
Anion gap: 14 (ref 5–15)
BUN: 26 mg/dL — ABNORMAL HIGH (ref 8–23)
CO2: 24 mmol/L (ref 22–32)
Calcium: 10 mg/dL (ref 8.9–10.3)
Chloride: 97 mmol/L — ABNORMAL LOW (ref 98–111)
Creatinine, Ser: 1.04 mg/dL — ABNORMAL HIGH (ref 0.44–1.00)
GFR calc Af Amer: 57 mL/min — ABNORMAL LOW (ref >60)
GFR calc non Af Amer: 49 mL/min — ABNORMAL LOW (ref >60)
Glucose, Bld: 201 mg/dL — ABNORMAL HIGH (ref 70–99)
Potassium: 3.8 mmol/L (ref 3.5–5.1)
Sodium: 135 mmol/L (ref 135–145)
Total Bilirubin: 0.5 mg/dL (ref 0.3–1.2)
Total Protein: 7.3 g/dL (ref 6.5–8.1)

## 2019-11-22 LAB — DIFFERENTIAL
Abs Immature Granulocytes: 0.08 10*3/uL — ABNORMAL HIGH (ref 0.00–0.07)
Basophils Absolute: 0 10*3/uL (ref 0.0–0.1)
Basophils Relative: 0 %
Eosinophils Absolute: 0 10*3/uL (ref 0.0–0.5)
Eosinophils Relative: 0 %
Immature Granulocytes: 1 %
Lymphocytes Relative: 10 %
Lymphs Abs: 1.3 10*3/uL (ref 0.7–4.0)
Monocytes Absolute: 0.7 10*3/uL (ref 0.1–1.0)
Monocytes Relative: 6 %
Neutro Abs: 10.8 10*3/uL — ABNORMAL HIGH (ref 1.7–7.7)
Neutrophils Relative %: 83 %

## 2019-11-22 LAB — CBC
HCT: 35.3 % — ABNORMAL LOW (ref 36.0–46.0)
Hemoglobin: 11.2 g/dL — ABNORMAL LOW (ref 12.0–15.0)
MCH: 26.8 pg (ref 26.0–34.0)
MCHC: 31.7 g/dL (ref 30.0–36.0)
MCV: 84.4 fL (ref 80.0–100.0)
Platelets: 323 10*3/uL (ref 150–400)
RBC: 4.18 MIL/uL (ref 3.87–5.11)
RDW: 14.6 % (ref 11.5–15.5)
WBC: 13 10*3/uL — ABNORMAL HIGH (ref 4.0–10.5)
nRBC: 0 % (ref 0.0–0.2)

## 2019-11-22 LAB — GLUCOSE, CAPILLARY: Glucose-Capillary: 196 mg/dL — ABNORMAL HIGH (ref 70–99)

## 2019-11-22 LAB — APTT: aPTT: 29 s (ref 24–36)

## 2019-11-22 LAB — MRSA PCR SCREENING: MRSA by PCR: NEGATIVE

## 2019-11-22 LAB — SARS CORONAVIRUS 2 BY RT PCR (HOSPITAL ORDER, PERFORMED IN ~~LOC~~ HOSPITAL LAB): SARS Coronavirus 2: NEGATIVE

## 2019-11-22 LAB — HEMOGLOBIN A1C
Hgb A1c MFr Bld: 7.9 % — ABNORMAL HIGH (ref 4.8–5.6)
Mean Plasma Glucose: 180.03 mg/dL

## 2019-11-22 LAB — PROTIME-INR
INR: 0.9 (ref 0.8–1.2)
Prothrombin Time: 11.9 seconds (ref 11.4–15.2)

## 2019-11-22 LAB — CBG MONITORING, ED: Glucose-Capillary: 195 mg/dL — ABNORMAL HIGH (ref 70–99)

## 2019-11-22 MED ORDER — CHLORHEXIDINE GLUCONATE 0.12 % MT SOLN
15.0000 mL | Freq: Two times a day (BID) | OROMUCOSAL | Status: DC
  Administered 2019-11-22 – 2019-12-08 (×30): 15 mL via OROMUCOSAL
  Filled 2019-11-22 (×23): qty 15

## 2019-11-22 MED ORDER — CLEVIDIPINE BUTYRATE 0.5 MG/ML IV EMUL
1.0000 mg/h | INTRAVENOUS | Status: DC
  Administered 2019-11-22: 1 mg/h via INTRAVENOUS
  Filled 2019-11-22: qty 50

## 2019-11-22 MED ORDER — PROMETHAZINE HCL 25 MG PO TABS: 12.5000 mg | ORAL_TABLET | ORAL | Status: DC | PRN

## 2019-11-22 MED ORDER — ONDANSETRON HCL 4 MG/2ML IJ SOLN
4.0000 mg | Freq: Once | INTRAMUSCULAR | Status: AC
  Administered 2019-11-22: 4 mg via INTRAVENOUS

## 2019-11-22 MED ORDER — HEPARIN SODIUM (PORCINE) 5000 UNIT/ML IJ SOLN
5000.0000 [IU] | Freq: Three times a day (TID) | INTRAMUSCULAR | Status: DC
  Administered 2019-11-24 – 2019-12-01 (×22): 5000 [IU] via SUBCUTANEOUS
  Filled 2019-11-22 (×22): qty 1

## 2019-11-22 MED ORDER — ONDANSETRON HCL 4 MG/2ML IJ SOLN: 4.0000 mg | INTRAMUSCULAR | Status: DC | PRN

## 2019-11-22 MED ORDER — ACETAMINOPHEN 650 MG RE SUPP
650.0000 mg | RECTAL | Status: DC | PRN
  Administered 2019-11-24 (×2): 650 mg via RECTAL
  Filled 2019-11-22 (×2): qty 1

## 2019-11-22 MED ORDER — FUROSEMIDE 20 MG PO TABS: 20.0000 mg | ORAL_TABLET | ORAL | Status: DC

## 2019-11-22 MED ORDER — SODIUM CHLORIDE 0.9 % IV SOLN: INTRAVENOUS | Status: DC

## 2019-11-22 MED ORDER — CHLORHEXIDINE GLUCONATE CLOTH 2 % EX PADS
6.0000 | MEDICATED_PAD | Freq: Every day | CUTANEOUS | Status: DC
  Administered 2019-11-23 – 2019-12-08 (×14): 6 via TOPICAL

## 2019-11-22 MED ORDER — SODIUM CHLORIDE 0.9% FLUSH
3.0000 mL | Freq: Once | INTRAVENOUS | Status: AC
  Administered 2019-11-22: 3 mL via INTRAVENOUS

## 2019-11-22 MED ORDER — POLYETHYLENE GLYCOL 3350 17 G PO PACK
17.0000 g | PACK | Freq: Every day | ORAL | Status: DC | PRN
  Filled 2019-11-22: qty 1

## 2019-11-22 MED ORDER — CLEVIDIPINE BUTYRATE 0.5 MG/ML IV EMUL
0.0000 mg/h | INTRAVENOUS | Status: DC
  Administered 2019-11-22: 2 mg/h via INTRAVENOUS
  Administered 2019-11-23: 12 mg/h via INTRAVENOUS
  Administered 2019-11-23: 4 mg/h via INTRAVENOUS
  Administered 2019-11-24 (×2): 2 mg/h via INTRAVENOUS
  Administered 2019-11-24: 3 mg/h via INTRAVENOUS
  Administered 2019-11-24 – 2019-11-25 (×3): 7 mg/h via INTRAVENOUS
  Administered 2019-11-25: 2 mg/h via INTRAVENOUS
  Administered 2019-11-25 – 2019-11-26 (×3): 6 mg/h via INTRAVENOUS
  Administered 2019-11-26: 4 mg/h via INTRAVENOUS
  Administered 2019-11-26: 7 mg/h via INTRAVENOUS
  Administered 2019-11-27: 12 mg/h via INTRAVENOUS
  Administered 2019-11-27: 15 mg/h via INTRAVENOUS
  Administered 2019-11-27: 13 mg/h via INTRAVENOUS
  Administered 2019-11-27: 7 mg/h via INTRAVENOUS
  Administered 2019-11-27: 15 mg/h via INTRAVENOUS
  Administered 2019-11-27: 7 mg/h via INTRAVENOUS
  Administered 2019-11-27: 12 mg/h via INTRAVENOUS
  Administered 2019-11-28: 2 mg/h via INTRAVENOUS
  Filled 2019-11-22 (×23): qty 50

## 2019-11-22 MED ORDER — ONDANSETRON HCL 4 MG/2ML IJ SOLN
4.0000 mg | Freq: Once | INTRAMUSCULAR | Status: AC | PRN
  Administered 2019-11-22: 4 mg via INTRAVENOUS
  Filled 2019-11-22: qty 2

## 2019-11-22 MED ORDER — ACETAMINOPHEN 325 MG PO TABS
650.0000 mg | ORAL_TABLET | ORAL | Status: DC | PRN
  Administered 2019-11-26: 650 mg via ORAL
  Filled 2019-11-22: qty 2

## 2019-11-22 MED ORDER — DOCUSATE SODIUM 100 MG PO CAPS: 100.0000 mg | ORAL_CAPSULE | Freq: Two times a day (BID) | ORAL | Status: DC

## 2019-11-22 MED ORDER — NITROGLYCERIN 0.4 MG SL SUBL: 0.4000 mg | SUBLINGUAL_TABLET | SUBLINGUAL | Status: DC | PRN

## 2019-11-22 MED ORDER — SIMVASTATIN 20 MG PO TABS: 20.0000 mg | ORAL_TABLET | ORAL | Status: DC

## 2019-11-22 MED ORDER — INSULIN ASPART 100 UNIT/ML ~~LOC~~ SOLN
0.0000 [IU] | Freq: Three times a day (TID) | SUBCUTANEOUS | Status: DC
  Administered 2019-11-23 (×2): 3 [IU] via SUBCUTANEOUS
  Administered 2019-11-24 – 2019-11-26 (×6): 2 [IU] via SUBCUTANEOUS

## 2019-11-22 MED ORDER — ONDANSETRON HCL 4 MG PO TABS: 4.0000 mg | ORAL_TABLET | ORAL | Status: DC | PRN

## 2019-11-22 MED ORDER — IRBESARTAN 150 MG PO TABS: 150.0000 mg | ORAL_TABLET | Freq: Every day | ORAL | Status: DC

## 2019-11-22 MED ORDER — CLONIDINE HCL 0.1 MG PO TABS: 0.1000 mg | ORAL_TABLET | Freq: Every day | ORAL | Status: DC

## 2019-11-22 MED ORDER — CLONIDINE HCL 0.2 MG PO TABS: 0.2000 mg | ORAL_TABLET | Freq: Two times a day (BID) | ORAL | Status: DC

## 2019-11-22 NOTE — Telephone Encounter (Signed)
Pt's son, Jianna Drabik called(Not on DPR) Pt at ER with neurological symptoms; can't remember where she is. Inform son was not on the [facility].

## 2019-11-22 NOTE — Telephone Encounter (Signed)
I contacted the pt's son and advised once pt is discharged from [facility] to let us know and we can work on getting her scheduled for a follow up appointment.  He verbalized understanding.  He sts the pt is being worked up for a stroke.

## 2019-11-22 NOTE — ED Notes (Signed)
Charge aware of need for next room, Called Ct to expedite scan.

## 2019-11-22 NOTE — ED Provider Notes (Signed)
[place] EMERGENCY DEPARTMENT Provider Note   CSN: [id number] Arrival date & time: 11/22/19  1351     History Chief Complaint  Patient presents with  . Altered Mental Status  . Hypertension    [name] is a 86 y.o. female.  Presents to ER with altered mental status.  History limited due to altered mental status, level 5 caveat.  History obtained via EMS report and son at bedside.  Patient was having progressive confusion throughout the morning.  Answering inappropriately, demonstrating odd behavior.  She had a mild fall, unsure if she hit her head late morning.  This occurred after she was acting abnormally.  No fever, no recent illnesses, no symptoms yesterday.  Takes Plavix, unsure of last dose.  HPI     Past Medical History:  Diagnosis Date  . BPPV (benign paroxysmal positional vertigo) 01/08/2016  . Bradycardia    a. Coreg decreased due to HR low 40s on event monitor 08/04/15 -> further decreased due to HR upper 30s in 01/2016.  . Breast nodule 02/15/2019  . CAD (coronary artery disease)    a. RV infarct 08-03-2005 c/b high grade AV block with total occlusion of RCA, unable to treat with PCI. b. Nuc 10/2015: scar but no ischemia.  . Carotid artery disease ([place])   . Chronic anemia   . Chronic lower back pain   . CKD (chronic kidney disease), stage III   . Complication of anesthesia    "almost died when I had my breast reduction"  . Diabetes ([place]) 2000/08/03  . Dyslipidemia   . Gait abnormality 09/06/2019  . GERD (gastroesophageal reflux disease)   . Heart murmur   . High cholesterol   . History of hiatal hernia   . HTN (hypertension)   . Hyperthyroidism   . Memory problem 02/15/2019  . Meniere disease    "for years" takes Antivert 3 times per day (01/31/2016)  . Myocardial infarction [facility]) 08/03/05   "almost died"  . Near syncope   . Osteoporosis   . Presbycusis of both ears 01/29/2016  . Renal artery stenosis (HCC)    a. left renal artery stent 04-Aug-2010.  b. s/p repeat left renal stenting 01/2016.  . Stroke [facility])    a. Prior infarcts seen on head CT.  . TMJ pain dysfunction syndrome 08/26/2016  . Type II diabetes mellitus ([place])    "controlled w/diet and exercise only" (01/31/2016)  . Weakness 02/26/2015    Patient Active Problem List   Diagnosis Date Noted  . Gait abnormality 09/06/2019  . Fall at home, initial encounter 02/15/2019  . Breast nodule 02/15/2019  . Memory problem 02/15/2019  . Pain 12/28/2017  . Primary osteoarthritis of left hand 12/28/2017  . TMJ pain dysfunction syndrome 08/26/2016  . Angina at rest [facility]) 06/15/2016  . Hypertensive urgency 06/15/2016  . Cerebrovascular disease 02/27/2016  . Anemia 02/01/2016  . Bradycardia 02/01/2016  . Left renal artery stenosis ([place]) 01/31/2016  . Presbycusis of both ears 01/29/2016  . BPPV (benign paroxysmal positional vertigo) 01/08/2016  . Impacted cerumen of right ear 01/08/2016  . Compression fracture of L1 lumbar vertebra (HCC) 04/23/2015  . T12 compression fracture ([place]) 03/20/2015  . Hyperthyroidism 03/11/2015  . Thyroid nodule 03/11/2015  . Syncope 03/08/2015  . Hypokalemia 03/08/2015  . CKD (chronic kidney disease), stage III 02/26/2015  . Multiple allergies 02/26/2015  . Hyponatremia 02/04/2015  . DM (diabetes mellitus) ([place]) 12/21/2011  . CAD (coronary artery disease) 12/21/2011  . HTN (  hypertension) 12/21/2011  . Hyperlipidemia 12/21/2011  . GERD (gastroesophageal reflux disease) 12/21/2011    Past Surgical History:  Procedure Laterality Date  . ANTERIOR CERVICAL DECOMP/DISCECTOMY FUSION  02/2002   Archie Endo 08/26/2010  . APPENDECTOMY    . BACK SURGERY    . BIOPSY  07/20/2019   Procedure: BIOPSY;  Surgeon: Otis Brace, MD;  Location: [facility];  Service: Gastroenterology;;  . BLADDER SUSPENSION  x3  . BREAST LUMPECTOMY  11/2007   breast needle-localized lumpectomy/notes 08/12/2012  . CARDIAC CATHETERIZATION  2007   "couldn't get stents in; I almost  died'  . CATARACT EXTRACTION W/ INTRAOCULAR LENS  IMPLANT, BILATERAL Bilateral   . CATARACT EXTRACTION W/ INTRAOCULAR LENS  IMPLANT, BILATERAL Bilateral   . CHOLECYSTECTOMY OPEN    . ESOPHAGOGASTRODUODENOSCOPY (EGD) WITH PROPOFOL N/A 07/20/2019   Procedure: ESOPHAGOGASTRODUODENOSCOPY (EGD) WITH PROPOFOL;  Surgeon: Otis Brace, MD;  Location: [facility];  Service: Gastroenterology;  Laterality: N/A;  . FRACTURE SURGERY    . HIP FRACTURE SURGERY Right 2011   "added rod in my leg and a ball"  . KYPHOPLASTY N/A 03/20/2015   Procedure: THORACIC TWELVE KYPHOPLASTY;  Surgeon: Jovita Gamma, MD;  Location: [place] NEURO ORS;  Service: Neurosurgery;  Laterality: N/A;  . KYPHOPLASTY N/A 04/23/2015   Procedure: KYPHOPLASTY - LUMBAR ONE;  Surgeon: Jovita Gamma, MD;  Location: [place] NEURO ORS;  Service: Neurosurgery;  Laterality: N/A;  L1 Kyphoplasty  . NECK HARDWARE REMOVAL  11/2002   "replaced screw"  . PERIPHERAL VASCULAR CATHETERIZATION Left 12/20/2014   Procedure: Renal Angiography;  Surgeon: Wellington Hampshire, MD;  Location: [place] CV LAB;  Service: Cardiovascular;  Laterality: Left;  . PERIPHERAL VASCULAR CATHETERIZATION N/A 01/31/2016   Procedure: Renal Angiography;  Surgeon: Lorretta Harp, MD;  Location: [place] CV LAB;  Service: Cardiovascular;  Laterality: N/A;  . PERIPHERAL VASCULAR CATHETERIZATION N/A 01/31/2016   Procedure: Abdominal Aortogram;  Surgeon: Lorretta Harp, MD;  Location: [place] CV LAB;  Service: Cardiovascular;  Laterality: N/A;  . PERIPHERAL VASCULAR CATHETERIZATION  01/31/2016   Procedure: Peripheral Vascular Intervention;  Surgeon: Lorretta Harp, MD;  Location: [place] CV LAB;  Service: Cardiovascular;;  . REDUCTION MAMMAPLASTY Bilateral 1985  . renal arteriogram  12/2014   occluded previous Lt renal stent and patent rt renal artery.  [name] RENAL ARTERY STENT Left 2012  . TONSILLECTOMY    . TUBAL LIGATION    . VAGINAL HYSTERECTOMY  ~ 1995  . WRIST  FRACTURE SURGERY Left 2012     OB History   No obstetric history on file.     Family History  Problem Relation Age of Onset  . Diabetes type II Mother   . Heart attack Mother   . Heart disease Mother   . Stroke Mother   . Congestive Heart Failure Father   . Pulmonary embolism Father   . Alzheimer's disease Sister   . Heart disease Sister   . Heart disease Brother   . Healthy Sister   . Healthy Sister   . Stroke Brother   . Dementia Brother        VASCULAR  . Stroke Brother   . Diabetes type II Brother   . Other Brother        KILLED IN PLANE CRASH  . Pulmonary disease Sister   . Heart attack Sister   . Kidney failure Sister   . Diabetes type II Sister   . Diabetes type II Sister   . Kidney failure Sister  Social History   Tobacco Use  . Smoking status: Former Smoker    Packs/day: 1.00    Years: 30.00    Pack years: 30.00    Types: Cigarettes  . Smokeless tobacco: Never Used  . Tobacco comment: "quit smoking in 1989"  Vaping Use  . Vaping Use: Never used  Substance Use Topics  . Alcohol use: No    Alcohol/week: 0.0 standard drinks  . Drug use: No    Home Medications Prior to Admission medications   Medication Sig Start Date End Date Taking? Authorizing Provider  acetaminophen (TYLENOL) 500 MG tablet Take 1,000 mg by mouth every 8 (eight) hours as needed for mild pain (for pain.).     [provider]  aspirin 81 MG tablet Take 1 tablet (81 mg total) by mouth daily. 02/01/16   Dunn, Nedra Hai, PA-C  azelastine (ASTELIN) 0.1 % nasal spray Place 1-2 sprays into both nostrils daily as needed for rhinitis or allergies.  12/14/15   [provider]  betamethasone dipropionate (DIPROLENE) 0.05 % cream Apply 1 application topically daily as needed (skin spots).  12/10/18   [provider]  cefdinir (OMNICEF) 300 MG capsule Take 300 mg by mouth 2 (two) times daily. For 5 days.    [provider]  cholecalciferol (VITAMIN D) 1000  UNITS tablet Take 2,000 Units by mouth daily.     [provider]  cloNIDine (CATAPRES) 0.1 MG tablet TAKE TWO TABLETS BY MOUTH IN THE MORNING, ONE TABLET AT 4 PM AND TWO TABLETS AT 10 PM DAILY 04/28/19   Richardson Dopp T, PA-C  clopidogrel (PLAVIX) 75 MG tablet Take 1 tablet (75 mg total) by mouth daily. Please make overdue appt for future refills. [phone]. 2nd attempt. 04/20/18   Belva Crome, MD  denosumab (PROLIA) 60 MG/ML SOLN injection Inject 60 mg into the skin every 6 (six) months. Administer in upper arm, thigh, or abdomen    [provider]  docusate sodium (COLACE) 100 MG capsule Take 200 mg by mouth at bedtime as needed for mild constipation.     [provider]  ferrous sulfate (FERROUSUL) 325 (65 FE) MG tablet Take 1 tablet (325 mg total) by mouth as directed. Three times weekly with food 08/22/19   [name], MD  fexofenadine (ALLEGRA) 180 MG tablet Take 180 mg by mouth daily.    [provider]  furosemide (LASIX) 20 MG tablet Take 1 tablet (20 mg total) by mouth 3 (three) times a week. Mon, Wed, and Fri only. 02/28/19 08/17/19  Richardson Dopp T, PA-C  meclizine (ANTIVERT) 25 MG tablet Take 12.5 mg by mouth as needed for dizziness.    [provider]  metFORMIN (GLUCOPHAGE-XR) 500 MG 24 hr tablet Take 500 mg by mouth daily with supper.  11/28/18   [provider]  methimazole (TAPAZOLE) 5 MG tablet Take 5 mg by mouth daily. 08/27/15   [provider]  nitroGLYCERIN (NITROSTAT) 0.4 MG SL tablet Place 0.4 mg under the tongue every 5 (five) minutes as needed for chest pain (3 doses MAX).    [provider]  OneTouch Delica Lancets 01U MISC Use to test blood sugar twice daily. Dx: E11.9 07/08/19   [name], MD  [facility] ULTRA test strip Use to test blood sugar twice daily. Dx: E11.9 07/08/19   [name], MD  pantoprazole (PROTONIX) 40 MG tablet Take 1 tablet (40 mg total) by mouth daily. 07/20/19    Brahmbhatt, Parag,  MD  PHENAZOPYRIDINE HCL PO Take 99.5 mg by mouth 6 (six) times daily.    [provider]  Polyethyl Glycol-Propyl Glycol (SYSTANE FREE OP) Place 2 drops into both eyes at bedtime as needed (dry eyes).     [provider]  simvastatin (ZOCOR) 20 MG tablet Take 20 mg by mouth every Monday, Wednesday, and Friday.     [provider]  telmisartan (MICARDIS) 40 MG tablet Take 20 mg by mouth 2 (two) times daily.    [provider]    Allergies    Fentanyl, Fish allergy, Morphine and related, Singulair [montelukast], Actonel [risedronate sodium], Dilaudid [hydromorphone hcl], Fosamax [alendronate sodium], Hydralazine, Lipitor [atorvastatin], Lisinopril, Septra [sulfamethoxazole-trimethoprim], Tekturna [aliskiren], Tricor [fenofibrate], Imdur [isosorbide dinitrate], Amlodipine, Avapro [irbesartan], Cardio complete [nutritional supplements], Cardizem [diltiazem hcl], Ciprofloxacin, Codeine, Cyclobenzaprine, Forteo [parathyroid hormone (recomb)], Inapsine [droperidol], Ivp dye [iodinated diagnostic agents], Shellfish allergy, and Tussionex pennkinetic er [employer] polst-cpm polst er]  Review of Systems   Review of Systems  Unable to perform ROS: Mental status change    Physical Exam Updated Vital Signs BP 108/64   Pulse 75   Temp 98.5 F (36.9 C) (Oral)   Resp (!) 21   Ht 5' (1.524 m)   Wt 47 kg   SpO2 90%   BMI 20.24 kg/m   Physical Exam Vitals and nursing note reviewed.  Constitutional:      Comments: Alert, significant confusion  HENT:     Head: Normocephalic and atraumatic.     Nose: Nose normal.     Mouth/Throat:     Mouth: Mucous membranes are moist.  Eyes:     Extraocular Movements: Extraocular movements intact.     Pupils: Pupils are equal, round, and reactive to light.  Cardiovascular:     Rate and Rhythm: Normal rate.     Pulses: Normal pulses.  Pulmonary:     Effort: Pulmonary effort is normal. No respiratory  distress.  Abdominal:     General: Abdomen is flat. There is no distension.     Palpations: There is no mass.  Musculoskeletal:        General: No deformity or signs of injury.     Cervical back: No rigidity or tenderness.  Skin:    General: Skin is warm and dry.  Neurological:     Comments: Alert, opens eyes, localizes pain in all 4 extremities, disorder verbal response     ED Results / Procedures / Treatments   Labs (all labs ordered are listed, but only abnormal results are displayed) Labs Reviewed  CBC - Abnormal; Notable for the following components:      Result Value   WBC 13.0 (*)    Hemoglobin 11.2 (*)    HCT 35.3 (*)    All other components within normal limits  DIFFERENTIAL - Abnormal; Notable for the following components:   Neutro Abs 10.8 (*)    Abs Immature Granulocytes 0.08 (*)    All other components within normal limits  COMPREHENSIVE METABOLIC PANEL - Abnormal; Notable for the following components:   Chloride 97 (*)    Glucose, Bld 201 (*)    BUN 26 (*)    Creatinine, Ser 1.04 (*)    GFR calc non Af Amer 49 (*)    GFR calc Af Amer 57 (*)    All other components within normal limits  CBG MONITORING, ED - Abnormal; Notable for the following components:   Glucose-Capillary 195 (*)    All other components within normal  limits  SARS CORONAVIRUS 2 BY RT PCR [facility] ORDER, [place] LAB)  PROTIME-INR  APTT    EKG EKG Interpretation  Date/Time:  Tuesday November 22 2019 13:56:35 EDT Ventricular Rate:  85 PR Interval:  146 QRS Duration: 86 QT Interval:  386 QTC Calculation: 459 R Axis:   49 Text Interpretation: Normal sinus rhythm Cannot rule out Anterior infarct , age undetermined ST & T wave abnormality, consider lateral ischemia Abnormal ECG Confirmed by Madalyn Rob [phone]) on 11/22/2019 4:13:13 PM   Radiology CT HEAD WO CONTRAST  Result Date: 11/22/2019 CLINICAL DATA:  Altered mental status, hypertension, vomiting  EXAM: CT HEAD WITHOUT CONTRAST TECHNIQUE: Contiguous axial images were obtained from the base of the skull through the vertex without intravenous contrast. COMPARISON:  04/07/2019 FINDINGS: Brain: Since the prior examination, there has developed a large para falcine subdural hematoma prior dominantly within the midline, but seen layering along the tentorium bilaterally, left greater than right, as well as anteriorly along the cerebral hemispheres, left greater than right. The subdural hematoma demonstrates asymmetric mass effect upon the left cerebral hemisphere with asymmetric sulcal effacement and effacement of the left lateral ventricle. 4 mm left-to-right midline shift. The subdural hematoma measures roughly 26 mm in thickness (axial image # 23/3 and coronal image # 21/5). No associated subarachnoid, intraparenchymal, or intraventricular hemorrhage is clearly identified. The suprasellar cistern is preserved; no evidence of uncal herniation. Extensive periventricular and subcortical white matter changes are present likely reflecting the sequela of small vessel ischemia. Remote lacunar infarct noted within the left putamen and right subinsular white matter probable tiny right occipital infarct is unchanged from prior examination. Remote left cerebellar hemispheric lacunar infarct. Vascular: Unremarkable Skull: Intact Sinuses/Orbits: Paranasal sinuses are clear. Orbits are unremarkable. Other: Mastoid air cells and middle ear cavities are clear. IMPRESSION: Large, largely para falcine subdural hematoma with mild mass effect asymmetrically involving the left cerebral hemisphere with mild effacement of the left lateral ventricle and minimal left-to-right midline shift. No associated calvarial fracture. These results were called by telephone at the time of interpretation on 11/22/2019 at 3:25 pm to provider RACHEL LITTLE , who verbally acknowledged these results. Electronically Signed   By: Fidela Salisbury MD   On:  11/22/2019 15:31    Procedures .Critical Care Performed by: Lucrezia Starch, MD Authorized by: Lucrezia Starch, MD   Critical care provider statement:    Critical care time (minutes):  45   Critical care was necessary to treat or prevent imminent or life-threatening deterioration of the following conditions:  CNS failure or compromise   Critical care was time spent personally by me on the following activities:  Discussions with consultants, evaluation of patient's response to treatment, examination of patient, ordering and performing treatments and interventions, ordering and review of laboratory studies, ordering and review of radiographic studies, pulse oximetry, re-evaluation of patient's condition, obtaining history from patient or surrogate and review of old charts   (including critical care time)  Medications Ordered in ED Medications  clevidipine (CLEVIPREX) infusion 0.5 mg/mL (4 mg/hr Intravenous Rate/Dose Change 11/22/19 1641)  sodium chloride flush (NS) 0.9 % injection 3 mL (3 mLs Intravenous Given 11/22/19 1534)  ondansetron (ZOFRAN) injection 4 mg (4 mg Intravenous Given 11/22/19 1457)  ondansetron (ZOFRAN) injection 4 mg (4 mg Intravenous Given 11/22/19 1521)    ED Course  I have reviewed the triage vital signs and the nursing notes.  Pertinent labs & imaging results that were available during my care  of the patient were reviewed by me and considered in my medical decision making (see chart for details).  Clinical Course as of Nov 21 1648  Tue Nov 22, 2019  1613 [place] again   [RD]  1614 D/w neuro - Shanon Brow PA they will defer to NSGY   [RD]  Bryce will see   [RD]    Clinical Course User Index [RD] Lucrezia Starch, MD   MDM Rules/Calculators/A&P                          86 year old lady presents to ER with altered mental status.  On my initial assessment, pupils equal and reactive, patient opening eyes spontaneously, had a disorder verbal  response, localized pain in all 4 extremities.  Vomiting.  CT head was concerning for large parafalcine subdural bleed.  Started Cleviprex for blood pressure control.  Discussed plavix hx with pharmacy, unsure of last dose, he does not feel ddavp or any other agent indicated at this time. Consulted neurosurgery who will come to bedside. Dr. [name] who will admit patient for further care.  Son at bedside reports patient has DNR, would not want intubation, ventilation, CPR, etc.  Okay with current medical management.  If patient further decompensates, he states likely family would want comfort measures only.  Unable to reach husband via phone; son reported he is en route to hospital. Will update when he arrives.   Final Clinical Impression(s) / ED Diagnoses Final diagnoses:  Subdural hemorrhage [facility])    Rx / DC Orders ED Discharge Orders    None       Lucrezia Starch, MD 11/22/19 1650

## 2019-11-22 NOTE — ED Notes (Signed)
Pt vomiting in triage 

## 2019-11-22 NOTE — ED Triage Notes (Addendum)
Pt arrives from home via [facility] with a c/o of altered mental status and had a fall this morning, ems unsure if she hit her head and pt does not recall event. Pt able to tell me her name and answers other questions not appropriatly. Pt answers "18 or19" to questions like are you hurting or do you live with anyone. Pt is alert but only oriented to name.

## 2019-11-22 NOTE — H&P (Signed)
Neurosurgery H&P  CC: fall  HPI: This is a 86 y.o. woman that presents after a fall with progressive confusion. She takes clopidogrel, presumably for CAD. She is very confused and non-verbal, is unable to give any further history. Per her family, she was at her neurologic baseline after the fall, then had progressive confusion that caused them to call EMS. No seizure-like activity, no new known focal weakness, no other recent changes in health per family.    ROS: A 14 point ROS was performed and is negative except as noted in the HPI and limited due to patient's AMS.   PMHx:  Past Medical History:  Diagnosis Date  . BPPV (benign paroxysmal positional vertigo) 01/08/2016  . Bradycardia    a. Coreg decreased due to HR low 40s on event monitor 2015-07-09 -> further decreased due to HR upper 30s in 01/2016.  . Breast nodule 02/15/2019  . CAD (coronary artery disease)    a. RV infarct 07-08-05 c/b high grade AV block with total occlusion of RCA, unable to treat with PCI. b. Nuc 10/2015: scar but no ischemia.  . Carotid artery disease ([place])   . Chronic anemia   . Chronic lower back pain   . CKD (chronic kidney disease), stage III   . Complication of anesthesia    "almost died when I had my breast reduction"  . Diabetes ([place]) 08-Jul-2000  . Dyslipidemia   . Gait abnormality 09/06/2019  . GERD (gastroesophageal reflux disease)   . Heart murmur   . High cholesterol   . History of hiatal hernia   . HTN (hypertension)   . Hyperthyroidism   . Memory problem 02/15/2019  . Meniere disease    "for years" takes Antivert 3 times per day (01/31/2016)  . Myocardial infarction [facility]) 07/08/05   "almost died"  . Near syncope   . Osteoporosis   . Presbycusis of both ears 01/29/2016  . Renal artery stenosis (HCC)    a. left renal artery stent 2010/07/09. b. s/p repeat left renal stenting 01/2016.  . Stroke [facility])    a. Prior infarcts seen on head CT.  . TMJ pain dysfunction syndrome 08/26/2016  . Type II diabetes mellitus ([place])     "controlled w/diet and exercise only" (01/31/2016)  . Weakness 02/26/2015   FamHx:  Family History  Problem Relation Age of Onset  . Diabetes type II Mother   . Heart attack Mother   . Heart disease Mother   . Stroke Mother   . Congestive Heart Failure Father   . Pulmonary embolism Father   . Alzheimer's disease Sister   . Heart disease Sister   . Heart disease Brother   . Healthy Sister   . Healthy Sister   . Stroke Brother   . Dementia Brother        VASCULAR  . Stroke Brother   . Diabetes type II Brother   . Other Brother        KILLED IN PLANE CRASH  . Pulmonary disease Sister   . Heart attack Sister   . Kidney failure Sister   . Diabetes type II Sister   . Diabetes type II Sister   . Kidney failure Sister    SocHx:  reports that she has quit smoking. Her smoking use included cigarettes. She has a 30.00 pack-year smoking history. She has never used smokeless tobacco. She reports that she does not drink alcohol and does not use drugs.  Exam: Vital signs in last 24 hours: Temp:  [  98.5 F (36.9 C)] 98.5 F (36.9 C) (08/10 1352) Pulse Rate:  [41-89] 75 (08/10 1638) Resp:  [12-25] 21 (08/10 1638) BP: (108-232)/(56-101) 108/64 (08/10 1638) SpO2:  [89 %-98 %] 90 % (08/10 1638) Weight:  [47 kg] 47 kg (08/10 1535) General: Awake, lying in bed in NAD Head: Normocephalic and atruamatic, no stigmata of trauma obvious on exam HEENT: Neck supple Pulmonary: breathing O2 via [place] comfortably, no evidence of increased work of breathing Cardiac: RRR Abdomen: S NT ND Extremities: Warm and well perfused x4 Neuro: Awake/alert, makes eye contact, PERRL, gaze neutral, non-verbal and not FC but squeezes fingers to cues. Moves BLE to noxious stimulus with L>R babinski's sign present.  Assessment and Plan: 86 y.o. woman on plavix s/p fall with progressive confusion. [place] personally reviewed, which shows bilateral parafalacine L>R acute subdural hematomas with brain compression but  basal cisterns patent. On exam, nonverbal but awake and cooperative.  Neuro -discussed the above with family, she is DNR but if she becomes comatose due to the intracranial bleeding, they would like to proceed with surgical intervention, if appropriate.  -admit to ICU for neuro monitoring overnight, will follow exam clinically  Cardiopulm -SBP<160, on clevidipine gtt, will transition in AM if she requires gtt overnight  FENGI -diet as tolerated, bedside swallow pending, unlikely she'll tolerate po intake, will start IVF -qd RFP given CKD -continue home Rx  Heme/ID -hold clopidogrel, no active issues  Dispo/PPx -PT/OT in AM if tolerated -hold SQH until PTD2  Judith Part, MD 11/22/19 5:42 PM [facility]

## 2019-11-23 ENCOUNTER — Inpatient Hospital Stay (HOSPITAL_COMMUNITY): Payer: PPO

## 2019-11-23 DIAGNOSIS — S065X9A Traumatic subdural hemorrhage with loss of consciousness of unspecified duration, initial encounter: Principal | ICD-10-CM

## 2019-11-23 DIAGNOSIS — R569 Unspecified convulsions: Secondary | ICD-10-CM

## 2019-11-23 LAB — GLUCOSE, CAPILLARY
Glucose-Capillary: 231 mg/dL — ABNORMAL HIGH (ref 70–99)
Glucose-Capillary: 145 mg/dL — ABNORMAL HIGH (ref 70–99)
Glucose-Capillary: 224 mg/dL — ABNORMAL HIGH (ref 70–99)
Glucose-Capillary: 118 mg/dL — ABNORMAL HIGH (ref 70–99)

## 2019-11-23 LAB — CBC
HCT: 35.2 % — ABNORMAL LOW (ref 36.0–46.0)
Hemoglobin: 11.4 g/dL — ABNORMAL LOW (ref 12.0–15.0)
MCH: 27.1 pg (ref 26.0–34.0)
MCHC: 32.4 g/dL (ref 30.0–36.0)
MCV: 83.8 fL (ref 80.0–100.0)
Platelets: 296 10*3/uL (ref 150–400)
RBC: 4.2 MIL/uL (ref 3.87–5.11)
RDW: 14.6 % (ref 11.5–15.5)
WBC: 15 10*3/uL — ABNORMAL HIGH (ref 4.0–10.5)
nRBC: 0 % (ref 0.0–0.2)

## 2019-11-23 LAB — BASIC METABOLIC PANEL
Anion gap: 14 (ref 5–15)
BUN: 17 mg/dL (ref 8–23)
CO2: 24 mmol/L (ref 22–32)
Calcium: 9.8 mg/dL (ref 8.9–10.3)
Chloride: 98 mmol/L (ref 98–111)
Creatinine, Ser: 0.99 mg/dL (ref 0.44–1.00)
GFR calc Af Amer: >60 mL/min (ref >60)
GFR calc non Af Amer: 52 mL/min — ABNORMAL LOW (ref >60)
Glucose, Bld: 239 mg/dL — ABNORMAL HIGH (ref 70–99)
Potassium: 3.6 mmol/L (ref 3.5–5.1)
Sodium: 136 mmol/L (ref 135–145)

## 2019-11-23 NOTE — Procedures (Signed)
Patient Name: MARYALICE PASLEY  MRN: [id number]  Epilepsy Attending: Lora Havens  Referring Physician/Provider: Dr. Deatra Ina Date: 11/23/2019 Duration: 23.33 mins  Patient history: 86 year old female with bilateral parafalcine subdural hemorrhages.  EEG evaluate for seizures.  Level of alertness: lethargic  AEDs during EEG study: None  Technical aspects: This EEG study was done with scalp electrodes positioned according to the 10-20 International system of electrode placement. Electrical activity was acquired at a sampling rate of 500Hz  and reviewed with a high frequency filter of 70Hz  and a low frequency filter of 1Hz . EEG data were recorded continuously and digitally stored.   Description:  EEG showed continuous generalized and lateralized left hemisphere 3 to 6 Hz theta-delta slowing. Hyperventilation and photic stimulation were not performed.     ABNORMALITY -Continuous slow, generalized and lateralized left hemisphere  IMPRESSION: This study is suggestive of cortical dysfunction in left hemisphere likely secondary to underlying structural abnormality. Additionally, there is evidence of severe diffuse encephalopathy, non specific to etiology. No seizures or epileptiform discharges were seen throughout the recording.   [employer]for[employer] Brosnahan [name]

## 2019-11-23 NOTE — Evaluation (Signed)
Occupational Therapy Evaluation Patient Details Name: [name] MRN: [id number] DOB: 08-31-1931 Today's Date: 11/23/2019    History of Present Illness 86 yo female admitted to ED on 8/10 for fall, progressive confusion, now nonverbal. CT head demonstrates bilateral parafalacine L>R acute subdural hematomas with brain compression but basal cisterns patent. PMH includes BPPV, CAD s/p MI, CKD III, DM, dyslipidemia, memory changes, osteoporosis.   Clinical Impression   PT admitted with confusion s/p fall. Pt currently with functional limitiations due to the deficits listed below (see OT problem list). Pt opening mouth slightly with presentation of spoon by SLP. Pt requires total (A) for all adls at this time. Pt with no verbalizations or sounds during session. Pt noted to have L side only tremor after up in chair. Rn notified and tremor resolves.  Pt will benefit from skilled OT to increase their independence and safety with adls and balance to allow discharge SNF.     Follow Up Recommendations  SNF    Equipment Recommendations  Wheelchair (measurements OT);Wheelchair cushion (measurements OT);Hospital bed    Recommendations for Other Services       Precautions / Restrictions Precautions Precautions: Fall Precaution Comments: R neglect (difficult to discern due to cognitive status); possible seizures Restrictions Weight Bearing Restrictions: No      Mobility Bed Mobility Overal bed mobility: Needs Assistance Bed Mobility: Rolling;Supine to Sit Rolling: Total assist   Supine to sit: Total assist     General bed mobility comments: unable to sustain static sitting. pt pushing with L Ue. pt allowed weight bearing on L UE and continued.   Transfers Overall transfer level: Needs assistance Equipment used: 2 person hand held assist Transfers: Sit to/from [employer] Sit to Stand: +2 physical assistance;Mod assist Stand pivot transfers: +2 physical  assistance;Mod assist       General transfer comment: pt stepping toward the chair on R side    Balance Overall balance assessment: Needs assistance Sitting-balance support: Single extremity supported;Feet unsupported Sitting balance-Leahy Scale: Zero Sitting balance - Comments: R lateral and posterior leaning briefly unsupported with attempted at righting reaction but unsuccessful. Postural control: Posterior lean;Right lateral lean Standing balance support: No upper extremity supported;During functional activity Standing balance-Leahy Scale: Zero Standing balance comment: total +2                           ADL either performed or assessed with clinical judgement   ADL Overall ADL's : Needs assistance/impaired                                       General ADL Comments: total (A) for all care at this time     Vision   Additional Comments: difficult to assess but appears to favor looking L over R and can come to midline.      Perception     Praxis      Pertinent Vitals/Pain Pain Assessment: No/denies pain Faces Pain Scale: No hurt Pain Intervention(s): Monitored during session     Hand Dominance Right   Extremity/Trunk Assessment Upper Extremity Assessment Upper Extremity Assessment: RUE deficits/detail;LUE deficits/detail RUE Deficits / Details: holding R UE in flexion pattern  LUE Deficits / Details: pushing with L UE into full elbow extension with sitting   Lower Extremity Assessment Lower Extremity Assessment: Defer to PT evaluation RLE Deficits / Details: full AROM hip and  knee flexion, mild resistance met secondary to guarding vs tone (difficult to discern); lacking 5* from neutral DF LLE Deficits / Details: same as above   Cervical / Trunk Assessment Cervical / Trunk Assessment: Kyphotic   Communication Communication Communication: Expressive difficulties;Receptive difficulties (mute during entire session)   Cognition  Arousal/Alertness: Awake/alert Behavior During Therapy: Flat affect Overall Cognitive Status: Difficult to assess                                 General Comments: open eyes to name call, makes eye contact not following commands    General Comments  VSS- noted once transfer to chair to have L side only tremor the entire side. pt sustained for > 1 minute then resolves and stops    Exercises     Shoulder Instructions      Home Living Family/patient expects to be discharged to:: Private residence Living Arrangements: Spouse/significant other Available Help at Discharge: Family Type of Home: Independent living facility       Home Layout: One level     Bathroom Shower/Tub: Walk-in shower         Home Equipment: Environmental consultant - 4 wheels   Additional Comments: information located from previous admission data- no family present and unabel to obtain from patient      Prior Functioning/Environment Level of Independence: Needs assistance  Gait / Transfers Assistance Needed: Per PT note 9 months ago: walks with rollator in daytime and RW to bathroom at night; was getting PT at facility 2-3 times a week ADL's / Homemaking Assistance Needed: sponge bathes two days and showers one day spouse helps sometimes   Comments: Information obtained from previous admission, pt nonverbal and no family present        OT Problem List: Decreased strength;Decreased activity tolerance;Decreased range of motion;Impaired balance (sitting and/or standing);Impaired vision/perception;Decreased safety awareness;Decreased cognition;Decreased coordination;Decreased knowledge of use of DME or AE;Decreased knowledge of precautions;Cardiopulmonary status limiting activity      OT Treatment/Interventions: Self-care/ADL training;Therapeutic exercise;Neuromuscular education;Energy conservation;Manual therapy;DME and/or AE instruction;Modalities;Therapeutic activities;Cognitive  remediation/compensation;Patient/family education;Balance training    OT Goals(Current goals can be found in the care plan section) Acute Rehab OT Goals Patient Stated Goal: none stated OT Goal Formulation: Patient unable to participate in goal setting Time For Goal Achievement: 12/07/19 Potential to Achieve Goals: Good  OT Frequency: Min 2X/week   Barriers to D/C:            Co-evaluation PT/OT/SLP Co-Evaluation/Treatment: Yes Reason for Co-Treatment: For patient/therapist safety;To address functional/ADL transfers   OT goals addressed during session: ADL's and self-care;Proper use of Adaptive equipment and DME;Strengthening/ROM SLP goals addressed during session: Swallowing    AM-PAC OT "6 Clicks" Daily Activity     Outcome Measure Help from another person eating meals?: Total Help from another person taking care of personal grooming?: Total Help from another person toileting, which includes using toliet, bedpan, or urinal?: Total Help from another person bathing (including washing, rinsing, drying)?: Total Help from another person to put on and taking off regular upper body clothing?: Total Help from another person to put on and taking off regular lower body clothing?: Total 6 Click Score: 6   End of Session Equipment Utilized During Treatment: Gait belt Nurse Communication: Mobility status;Precautions  Activity Tolerance: Patient tolerated treatment well Patient left: in chair;with chair alarm set;with call bell/phone within reach  OT Visit Diagnosis: Unsteadiness on feet (R26.81);Muscle weakness (generalized) (M62.81)  Time: 1005-1040 OT Time Calculation (min): 35 min Charges:  OT General Charges $OT Visit: 1 Visit OT Evaluation $OT Eval Moderate Complexity: 1 Mod OT Treatments $Self Care/Home Management : 8-22 mins   [name], OTR/L  Acute Rehabilitation Services Pager: [phone] Office: [phone] .   Jeri Modena 11/23/2019, 1:34 PM

## 2019-11-23 NOTE — Progress Notes (Signed)
Neurosurgery Service Progress Note  Subjective: No acute events overnight, clinically patient is clinically stable compared to her mental status at admission yesterday.   Objective: Vitals:   11/23/19 0700 11/23/19 0715 11/23/19 0730 11/23/19 0745  BP: (!) 130/116 (!) 139/114 (!) 137/59 (!) 114/50  Pulse: 100 99 85 75  Resp: 19 (!) 22 (!) 21 12  Temp:      TempSrc:      SpO2: 100% 100% 99% 99%  Weight:      Height:       Temp (24hrs), Avg:98.9 F (37.2 C), Min:97.8 F (36.6 C), Max:99.6 F (37.6 C)  CBC Latest Ref Rng & Units 11/22/2019 09/19/2019 08/17/2019  WBC 4.0 - 10.5 K/uL 13.0(H) 7.9 7.7  Hemoglobin 12.0 - 15.0 g/dL 11.2(L) 9.4(L) 8.8(L)  Hematocrit 36 - 46 % 35.3(L) 31.4(L) 27.0(L)  Platelets 150 - 400 K/uL 323 374 412(H)   BMP Latest Ref Rng & Units 11/22/2019 09/19/2019 08/17/2019  Glucose 70 - 99 mg/dL 201(H) 213(H) 145(H)  BUN 8 - 23 mg/dL 26(H) 20 18  Creatinine 0.44 - 1.00 mg/dL 1.04(H) 1.14(H) 1.02(H)  BUN/Creat Ratio 6 - 22 (calc) - - 18  Sodium 135 - 145 mmol/L 135 126(L) 131(L)  Potassium 3.5 - 5.1 mmol/L 3.8 4.5 4.7  Chloride 98 - 111 mmol/L 97(L) 94(L) 99  CO2 22 - 32 mmol/L 24 20(L) 23  Calcium 8.9 - 10.3 mg/dL 10.0 8.7(L) 9.0    Intake/Output Summary (Last 24 hours) at 11/23/2019 0818 Last data filed at 11/23/2019 0800 Gross per 24 hour  Intake 960 ml  Output 700 ml  Net 260 ml    Current Facility-Administered Medications:  .  0.9 %  sodium chloride infusion, , Intravenous, Continuous, Alyzae Hawkey, Joyice Faster, MD, Last Rate: 75 mL/hr at 11/23/19 0800, Rate Verify at 11/23/19 0800 .  acetaminophen (TYLENOL) tablet 650 mg, 650 mg, Oral, Q4H PRN **OR** acetaminophen (TYLENOL) suppository 650 mg, 650 mg, Rectal, Q4H PRN, Lakoda Raske A, MD .  chlorhexidine (PERIDEX) 0.12 % solution 15 mL, 15 mL, Mouth Rinse, BID, Edsel Shives, Joyice Faster, MD, 15 mL at 11/22/19 2000 .  Chlorhexidine Gluconate Cloth 2 % PADS 6 each, 6 each, Topical, Q0600, Felesha Moncrieffe A,  MD .  clevidipine (CLEVIPREX) infusion 0.5 mg/mL, 0-21 mg/hr, Intravenous, Continuous, Sachit Gilman, Joyice Faster, MD, Stopped at 11/23/19 0755 .  cloNIDine (CATAPRES) tablet 0.1 mg, 0.1 mg, Oral, Q1400, Judith Part, MD .  cloNIDine (CATAPRES) tablet 0.2 mg, 0.2 mg, Oral, BID, Heidemarie Goodnow, Joyice Faster, MD .  docusate sodium (COLACE) capsule 100 mg, 100 mg, Oral, BID, Lian Tanori, Joyice Faster, MD .  furosemide (LASIX) tablet 20 mg, 20 mg, Oral, Once per day on Mon Wed Fri, Hosam Mcfetridge, Joyice Faster, MD .  Derrill Memo ON 11/24/2019] heparin injection 5,000 Units, 5,000 Units, Subcutaneous, Q8H, Tivon Lemoine A, MD .  insulin aspart (novoLOG) injection 0-9 Units, 0-9 Units, Subcutaneous, TID WC, Tressie Ragin A, MD .  irbesartan (AVAPRO) tablet 150 mg, 150 mg, Oral, Daily, Anvita Hirata A, MD .  nitroGLYCERIN (NITROSTAT) SL tablet 0.4 mg, 0.4 mg, Sublingual, Q5 min PRN, Kenzley Ke A, MD .  ondansetron (ZOFRAN) tablet 4 mg, 4 mg, Oral, Q4H PRN **OR** ondansetron (ZOFRAN) injection 4 mg, 4 mg, Intravenous, Q4H PRN, Michai Dieppa A, MD .  polyethylene glycol (MIRALAX / GLYCOLAX) packet 17 g, 17 g, Oral, Daily PRN, Judith Part, MD .  promethazine (PHENERGAN) tablet 12.5-25 mg, 12.5-25 mg, Oral, Q4H PRN, Judith Part, MD .  simvastatin (  ZOCOR) tablet 20 mg, 20 mg, Oral, Q M,W,F, Ericka Marcellus, Joyice Faster, MD   Physical Exam: Eyes open spontaneously, makes eye contact but with left gaze preference versus right sided neglect, appears more consistent with neglect, PERRL, not FC but MAEx4 to stimulus, localizes bilaterally in upper extremities  Assessment & Plan: 86 y.o. woman s/p fall, on plavix for CAD, CTH w/ bilateral parafalcine acute SDH.  Neuro -discussed the above with family, she is DNR but if she becomes comatose due to the intracranial bleeding, they would like to proceed with surgical intervention, if appropriate.  -will repeat CTH today then place EEG leads to eval for underlying  status  Cardiopulm -SBP<180, clevidipine gtt off, unable to take po clonidine, will see if she gets any rebound HTN today  FENGI -diet as tolerated, bedside swallow failed 2/2 mental status, continue IVF -qd RFP pending, pt has CKD -continue home Rx  Heme/ID -hold clopidogrel, no active issues  Dispo/PPx -hold SQH until PTD2 -keep in ICU while workup continues for AMS  Judith Part  11/23/19 8:18 AM

## 2019-11-23 NOTE — Progress Notes (Signed)
EEG complete - results pending 

## 2019-11-23 NOTE — Evaluation (Addendum)
Clinical/Bedside Swallow Evaluation Patient Details  Name: [name] MRN: [id number] Date of Birth: 04-02-1932  Today's Date: 11/23/2019 Time: SLP Start Time (ACUTE ONLY): 1017 SLP Stop Time (ACUTE ONLY): 1040 SLP Time Calculation (min) (ACUTE ONLY): 23 min  Past Medical History:  Past Medical History:  Diagnosis Date  . BPPV (benign paroxysmal positional vertigo) 01/08/2016  . Bradycardia    a. Coreg decreased due to HR low 40s on event monitor 20-Jul-2015 -> further decreased due to HR upper 30s in 01/2016.  . Breast nodule 02/15/2019  . CAD (coronary artery disease)    a. RV infarct 07-19-2005 c/b high grade AV block with total occlusion of RCA, unable to treat with PCI. b. Nuc 10/2015: scar but no ischemia.  . Carotid artery disease ([place])   . Chronic anemia   . Chronic lower back pain   . CKD (chronic kidney disease), stage III   . Complication of anesthesia    "almost died when I had my breast reduction"  . Diabetes ([place]) 19-Jul-2000  . Dyslipidemia   . Gait abnormality 09/06/2019  . GERD (gastroesophageal reflux disease)   . Heart murmur   . High cholesterol   . History of hiatal hernia   . HTN (hypertension)   . Hyperthyroidism   . Memory problem 02/15/2019  . Meniere disease    "for years" takes Antivert 3 times per day (01/31/2016)  . Myocardial infarction [facility]) Jul 19, 2005   "almost died"  . Near syncope   . Osteoporosis   . Presbycusis of both ears 01/29/2016  . Renal artery stenosis ([facility])    a. left renal artery stent July 20, 2010. b. s/p repeat left renal stenting 01/2016.  . Stroke [facility])    a. Prior infarcts seen on head CT.  . TMJ pain dysfunction syndrome 08/26/2016  . Type II diabetes mellitus ([place])    "controlled w/diet and exercise only" (01/31/2016)  . Weakness 02/26/2015   Past Surgical History:  Past Surgical History:  Procedure Laterality Date  . ANTERIOR CERVICAL DECOMP/DISCECTOMY FUSION  02/2002   Archie Endo 08/26/2010  . APPENDECTOMY    . BACK SURGERY    . BIOPSY  07/20/2019    Procedure: BIOPSY;  Surgeon: Otis Brace, MD;  Location: [facility] ENDOSCOPY;  Service: Gastroenterology;;  . BLADDER SUSPENSION  x3  . BREAST LUMPECTOMY  11/2007   breast needle-localized lumpectomy/notes 08/12/2012  . CARDIAC CATHETERIZATION  2005-07-19   "couldn't get stents in; I almost died'  . CATARACT EXTRACTION W/ INTRAOCULAR LENS  IMPLANT, BILATERAL Bilateral   . CATARACT EXTRACTION W/ INTRAOCULAR LENS  IMPLANT, BILATERAL Bilateral   . CHOLECYSTECTOMY OPEN    . ESOPHAGOGASTRODUODENOSCOPY (EGD) WITH PROPOFOL N/A 07/20/2019   Procedure: ESOPHAGOGASTRODUODENOSCOPY (EGD) WITH PROPOFOL;  Surgeon: Otis Brace, MD;  Location: [facility] ENDOS[facility];  Service: Gastroenterology;  Laterality: N/A;  . FRACTURE SURGERY    . HIP FRACTURE SURGERY Right 19-Jul-2009   "added rod in my leg and a ball"  . KYPHOPLASTY N/A 03/20/2015   Procedure: THORACIC TWELVE KYPHOPLASTY;  Surgeon: Jovita Gamma, MD;  Location: [place] NEURO ORS;  Service: Neurosurgery;  Laterality: N/A;  . KYPHOPLASTY N/A 04/23/2015   Procedure: KYPHOPLASTY - LUMBAR ONE;  Surgeon: Jovita Gamma, MD;  Location: [place] NEURO ORS;  Service: Neurosurgery;  Laterality: N/A;  L1 Kyphoplasty  . NECK HARDWARE REMOVAL  11/2002   "replaced screw"  . PERIPHERAL VASCULAR CATHETERIZATION Left 12/20/2014   Procedure: Renal Angiography;  Surgeon: Wellington Hampshire, MD;  Location: [place] CV LAB;  Service: Cardiovascular;  Laterality: Left;  . PERIPHERAL VASCULAR CATHETERIZATION N/A 01/31/2016   Procedure: Renal Angiography;  Surgeon: Lorretta Harp, MD;  Location: [place] CV LAB;  Service: Cardiovascular;  Laterality: N/A;  . PERIPHERAL VASCULAR CATHETERIZATION N/A 01/31/2016   Procedure: Abdominal Aortogram;  Surgeon: Lorretta Harp, MD;  Location: [place] CV LAB;  Service: Cardiovascular;  Laterality: N/A;  . PERIPHERAL VASCULAR CATHETERIZATION  01/31/2016   Procedure: Peripheral Vascular Intervention;  Surgeon: Lorretta Harp, MD;  Location: [place] CV  LAB;  Service: Cardiovascular;;  . REDUCTION MAMMAPLASTY Bilateral 1985  . renal arteriogram  12/2014   occluded previous Lt renal stent and patent rt renal artery.  [name] RENAL ARTERY STENT Left 2012  . TONSILLECTOMY    . TUBAL LIGATION    . VAGINAL HYSTERECTOMY  ~ 1995  . WRIST FRACTURE SURGERY Left 2012   HPI:  86 yo female admitted with increasing AMS s/p fall. CT showed bil parafalacine L>R acute SDH with brain compression. PMH: BPPV, Bradycardia, CAD, CKD, DM, HLD, ACDF 2003, CVA, meniere disease, memory problems, GERD   Assessment / Plan / Recommendation Clinical Impression  Pt was seen with OT to maximize positioning and safety for PO trials. Pt was assisted OOB to the chair, where she attempted PO trials. She demonstrated intermittent opening of her mouth with presentation of spoon more than straw, even with hand-over-hand assist for self-feeding. Beyond opening her mouth she did not make much attempt to accept boluses. When SLP assisted with placement of ice chips in her mouth she demonstrated anterior loss of thin liquid but did exhibit hyoid movement to palpation. She did not consistently elicit swallowing across trials though. Her mentation increases risk for aspiration but would also make it unlikely for her to meet nutritional needs at this time. Recommend that she remain NPO for now. Recommend ordering SLP speech/language evaluation as well.   Also of note, pt had tremor of her RUE that began once sitting upright EOB. This was monitored throughout session and RN made aware.   SLP Visit Diagnosis: Dysphagia, unspecified (R13.10)    Aspiration Risk  Moderate aspiration risk    Diet Recommendation NPO   Medication Administration: Via alternative means    Other  Recommendations Oral Care Recommendations: Oral care QID Other Recommendations: Have oral suction available   Follow up Recommendations Skilled Nursing facility      Frequency and Duration min 2x/week  2 weeks        Prognosis Prognosis for Safe Diet Advancement: Good Barriers to Reach Goals: Cognitive deficits;Language deficits      Swallow Study   General HPI: 86 yo female admitted with increasing AMS s/p fall. CT showed bil parafalacine L>R acute SDH with brain compression. PMH: BPPV, Bradycardia, CAD, CKD, DM, HLD, ACDF 2003, CVA, meniere disease, memory problems, GERD Type of Study: Bedside Swallow Evaluation Previous Swallow Assessment: none in chart Diet Prior to this Study: NPO Temperature Spikes Noted: No Respiratory Status: Room air History of Recent Intubation: No Behavior/Cognition: Alert;Doesn't follow directions Oral Cavity Assessment: Within Functional Limits (as can be visualized) Oral Care Completed by SLP: Yes Oral Cavity - Dentition: Adequate natural dentition Self-Feeding Abilities: Total assist Patient Positioning: Upright in chair Baseline Vocal Quality: Not observed Volitional Cough: Cognitively unable to elicit Volitional Swallow: Unable to elicit    Oral/Motor/Sensory Function Overall Oral Motor/Sensory Function:  (not following commands for direct assessment)   Ice Chips Ice chips: Impaired Presentation: Spoon Oral Phase Impairments: Reduced labial seal;Poor awareness of bolus  Oral Phase Functional Implications: Prolonged oral transit;Right anterior spillage Pharyngeal Phase Impairments: Suspected delayed Swallow   Thin Liquid Thin Liquid: Impaired Presentation: Spoon;Straw Oral Phase Impairments: Reduced labial seal;Poor awareness of bolus Oral Phase Functional Implications: Oral holding Pharyngeal  Phase Impairments: Unable to trigger swallow    Nectar Thick Nectar Thick Liquid: Not tested   Honey Thick Honey Thick Liquid: Not tested   Puree Puree: Not tested   Solid     Solid: Not tested      Osie Bond., M.A. [place] Pager [phone] Office [phone]  11/23/2019,1:20 PM

## 2019-11-23 NOTE — Progress Notes (Signed)
LTM EEG hooked up and running - no initial skin breakdown - push button tested - neuro notified. Same leads from spot  Monitored

## 2019-11-23 NOTE — Progress Notes (Signed)
This encounter was created in error - please disregard.

## 2019-11-23 NOTE — Evaluation (Signed)
Physical Therapy Evaluation Patient Details Name: [name] MRN: [id number] DOB: 1931-07-31 Today's Date: 11/23/2019   History of Present Illness  86 yo female admitted to ED on 8/10 for fall, progressive confusion, now nonverbal. CT head demonstrates bilateral parafalacine L>R acute subdural hematomas with brain compression but basal cisterns patent. PMH includes BPPV, CAD s/p MI, CKD III, DM, dyslipidemia, memory changes, osteoporosis.  Clinical Impression   Pt presents with impaired cognition with no verbalization, apparent R neglect, generalized weakness difficult to discern R vs L weakness secondary to impaired cognition, and difficulty mobilizing. Pt to benefit from acute PT to address deficits. Pt required total +2 for bed mobility and transfer from recliner to chair this day, given pt weakness and no command following. PT recommending SNF level of care post-acutely to address mobility deficits.  PT to progress mobility as tolerated, and will continue to follow acutely.      Follow Up Recommendations SNF;Supervision/Assistance - 24 hour    Equipment Recommendations  None recommended by PT    Recommendations for Other Services       Precautions / Restrictions Precautions Precautions: Fall Precaution Comments: R neglect (difficult to discern due to cognitive status); possible seizures Restrictions Weight Bearing Restrictions: No      Mobility  Bed Mobility Overal bed mobility: Needs Assistance Bed Mobility: Supine to Sit;Rolling Rolling: Total assist;+2 for physical assistance   Supine to sit: Total assist;+2 for physical assistance     General bed mobility comments: pt up in recliner upon PT arrival to room, total +2 for return to supine and rolling bilaterally for correct chuck pad placement. Pt participating 0%, no command following.  Transfers Overall transfer level: Needs assistance Equipment used: 2 person hand held assist Transfers: Sit to/from [employer] Sit to Stand: Total assist;+2 physical assistance Stand pivot transfers: +2 physical assistance;Total assist       General transfer comment: total +2 for stand and pivot to bed towards L, pt translating LLE more than R during automatic stepping in standing.  Ambulation/Gait             General Gait Details: unable  Stairs            Wheelchair Mobility    Modified Rankin (Stroke Patients Only)       Balance Overall balance assessment: Needs assistance;History of Falls Sitting-balance support: Bilateral upper extremity supported;Single extremity supported Sitting balance-Leahy Scale: Poor Sitting balance - Comments: R lateral and posterior leaning briefly unsupported with attempted at righting reaction but unsuccessful. Postural control: Posterior lean;Right lateral lean Standing balance support: Bilateral upper extremity supported;During functional activity Standing balance-Leahy Scale: Zero Standing balance comment: total +2                             Pertinent Vitals/Pain Pain Assessment: Faces Faces Pain Scale: No hurt Pain Intervention(s): Monitored during session    Home Living Family/patient expects to be discharged to:: Private residence Living Arrangements: Spouse/significant other   Type of Home: Independent living facility (friends home [place])       Home Layout: One level Home Equipment: Environmental consultant - 4 wheels      Prior Function Level of Independence: Needs assistance   Gait / Transfers Assistance Needed: Per PT note 9 months ago: walks with rollator in daytime and RW to bathroom at night; was getting PT at facility 2-3 times a week     Comments: Information obtained from previous admission,  pt nonverbal and no family present     Hand Dominance   Dominant Hand: Right    Extremity/Trunk Assessment   Upper Extremity Assessment Upper Extremity Assessment: Defer to OT evaluation    Lower Extremity  Assessment Lower Extremity Assessment: Difficult to assess due to impaired cognition;RLE deficits/detail;LLE deficits/detail RLE Deficits / Details: full AROM hip and knee flexion, mild resistance met secondary to guarding vs tone (difficult to discern); lacking 5* from neutral DF LLE Deficits / Details: same as above    Cervical / Trunk Assessment Cervical / Trunk Assessment: Kyphotic  Communication   Communication: Other (comment);Expressive difficulties (nonverbal)  Cognition Arousal/Alertness: Awake/alert Behavior During Therapy: Flat affect Overall Cognitive Status: Difficult to assess                                 General Comments: pt alerts to name, makes eye contact when PT directly in front of pt. Pt not crossing midline towards R with head or eyes. No command following noted on eval.      General Comments General comments (skin integrity, edema, etc.): Periods of eye closing and LUE trembling lasting ~30 seconds, witnessed x2. RN notified.    Exercises     Assessment/Plan    PT Assessment Patient needs continued PT services  PT Problem List Decreased strength;Decreased mobility;Decreased safety awareness;Decreased coordination;Decreased activity tolerance;Decreased balance;Decreased knowledge of use of DME;Decreased cognition;Decreased knowledge of precautions       PT Treatment Interventions DME instruction;Therapeutic activities;Gait training;Therapeutic exercise;Patient/family education;Balance training;Neuromuscular re-education;Functional mobility training    PT Goals (Current goals can be found in the Care Plan section)  Acute Rehab PT Goals Patient Stated Goal: none stated PT Goal Formulation: With patient Time For Goal Achievement: 12/07/19 Potential to Achieve Goals: Fair    Frequency Min 2X/week   Barriers to discharge        Co-evaluation               AM-PAC PT "6 Clicks" Mobility  Outcome Measure Help needed turning from  your back to your side while in a flat bed without using bedrails?: Total Help needed moving from lying on your back to sitting on the side of a flat bed without using bedrails?: Total Help needed moving to and from a bed to a chair (including a wheelchair)?: Total Help needed standing up from a chair using your arms (e.g., wheelchair or bedside chair)?: Total Help needed to walk in hospital room?: Total Help needed climbing 3-5 steps with a railing? : Total 6 Click Score: 6    End of Session   Activity Tolerance: Patient limited by fatigue Patient left: in bed;with call bell/phone within reach;with nursing/sitter in room;Other (comment) (EEG tech in room placing EEG pads) Nurse Communication: Mobility status PT Visit Diagnosis: Muscle weakness (generalized) (M62.81);Other abnormalities of gait and mobility (R26.89)    Time: 0175-1025 PT Time Calculation (min) (ACUTE ONLY): 16 min   Charges:   PT Evaluation $PT Eval Low Complexity: 1 Low          [name] E, PT Acute Rehabilitation Services Pager [phone]  Office [phone]   [name] D [name] 11/23/2019, 12:29 PM

## 2019-11-23 NOTE — Progress Notes (Signed)
Occupational Therapy Treatment Patient Details Name: [name] MRN: [id number] DOB: [birth year] Today's Date: 11/23/2019    History of present illness 86 yo female admitted to ED on 8/10 for fall, progressive confusion, now nonverbal. CT head demonstrates bilateral parafalacine L>R acute subdural hematomas with brain compression but basal cisterns patent. PMH includes BPPV, CAD s/p MI, CKD III, DM, dyslipidemia, memory changes, osteoporosis.   OT comments  Pt second session (A) PT for transfer from chair to bed for pending EEG starting. Pt aroused to verbal commands but remains without verbalization or sound. Pt again with L side tremor present.    Follow Up Recommendations  SNF    Equipment Recommendations  Wheelchair (measurements OT);Wheelchair cushion (measurements OT);Hospital bed    Recommendations for Other Services      Precautions / Restrictions Precautions Precautions: Fall Precaution Comments: R neglect (difficult to discern due to cognitive status); possible seizures Restrictions Weight Bearing Restrictions: No       Mobility Bed Mobility Overal bed mobility: Needs Assistance Bed Mobility: Sit to Supine Rolling: Total assist   Supine to sit: Total assist Sit to supine: +2 for physical assistance;Total assist   General bed mobility comments: requires (A) to place bil LE on bed surface. total +2 total to scoot to [facility] with bed pad  Transfers Overall transfer level: Needs assistance Equipment used: 2 person hand held assist Transfers: Sit to/from [employer] Sit to Stand: +2 physical assistance;Total assist Stand pivot transfers: +2 physical assistance;Total assist       General transfer comment: pt without any stepping with BIL LE. pt just holding legs in full extension and pad used at hips to transfer to EOB.     Balance Overall balance assessment: Needs assistance Sitting-balance support: Single extremity supported;Feet  unsupported Sitting balance-Leahy Scale: Zero Sitting balance - Comments: R lateral and posterior leaning briefly unsupported with attempted at righting reaction but unsuccessful. Postural control: Posterior lean;Right lateral lean Standing balance support: No upper extremity supported;During functional activity Standing balance-Leahy Scale: Zero Standing balance comment: total +2                           ADL either performed or assessed with clinical judgement   ADL Overall ADL's : Needs assistance/impaired                                       General ADL Comments: total (A) for all care at this time     Vision   Additional Comments: difficult to assess but appears to favor looking L over R and can come to midline.    Perception     Praxis      Cognition Arousal/Alertness: Awake/alert Behavior During Therapy: Flat affect Overall Cognitive Status: Difficult to assess                                 General Comments: open eyes to name call, makes eye contact not following commands         Exercises     Shoulder Instructions       General Comments VSS- noted to have L side tremor again with movemetn    Pertinent Vitals/ Pain       Pain Assessment: No/denies pain Faces Pain Scale: No hurt Pain Intervention(s): Monitored during session  Home Living Family/patient expects to be discharged to:: Private residence Living Arrangements: Spouse/significant other Available Help at Discharge: Family Type of Home: Independent living facility       Home Layout: One level     Bathroom Shower/Tub: Walk-in shower         Home Equipment: Environmental consultant - 4 wheels   Additional Comments: information located from previous admission data- no family present and unabel to obtain from patient      Prior Functioning/Environment Level of Independence: Needs assistance  Gait / Transfers Assistance Needed: Per PT note 9 months ago: walks  with rollator in daytime and RW to bathroom at night; was getting PT at facility 2-3 times a week ADL's / Homemaking Assistance Needed: sponge bathes two days and showers one day spouse helps sometimes   Comments: Information obtained from previous admission, pt nonverbal and no family present   Frequency  Min 2X/week        Progress Toward Goals  OT Goals(current goals can now be found in the care plan section)  Progress towards OT goals: Not progressing toward goals - comment  Acute Rehab OT Goals Patient Stated Goal: none stated OT Goal Formulation: Patient unable to participate in goal setting Time For Goal Achievement: 12/07/19 Potential to Achieve Goals: Good  Plan Discharge plan remains appropriate    Co-evaluation    PT/OT/SLP Co-Evaluation/Treatment: Yes Reason for Co-Treatment: For patient/therapist safety;To address functional/ADL transfers   OT goals addressed during session: ADL's and self-care;Proper use of Adaptive equipment and DME SLP goals addressed during session: Swallowing    AM-PAC OT "6 Clicks" Daily Activity     Outcome Measure   Help from another person eating meals?: Total Help from another person taking care of personal grooming?: Total Help from another person toileting, which includes using toliet, bedpan, or urinal?: Total Help from another person bathing (including washing, rinsing, drying)?: Total Help from another person to put on and taking off regular upper body clothing?: Total Help from another person to put on and taking off regular lower body clothing?: Total 6 Click Score: 6    End of Session Equipment Utilized During Treatment: Gait belt  OT Visit Diagnosis: Unsteadiness on feet (R26.81);Muscle weakness (generalized) (M62.81)   Activity Tolerance Patient tolerated treatment well   Patient Left with call bell/phone within reach;in bed;Other (comment) (EEG tech present to start 24 hour EEG assessment)   Nurse Communication  Mobility status;Precautions        Time: 1050-1100 OT Time Calculation (min): 10 min  Charges: OT General Charges $OT Visit: 1 Visit OT Evaluation $OT Eval Moderate Complexity: 1 Mod OT Treatments $Self Care/Home Management : 8-22 mins $Therapeutic Activity: 8-22 mins   Brynn, OTR/L  Acute Rehabilitation Services Pager: [phone] Office: [phone] .    Jeri Modena 11/23/2019, 1:41 PM

## 2019-11-24 ENCOUNTER — Other Ambulatory Visit: Payer: PPO

## 2019-11-24 LAB — GLUCOSE, CAPILLARY
Glucose-Capillary: 199 mg/dL — ABNORMAL HIGH (ref 70–99)
Glucose-Capillary: 170 mg/dL — ABNORMAL HIGH (ref 70–99)
Glucose-Capillary: 98 mg/dL (ref 70–99)
Glucose-Capillary: 165 mg/dL — ABNORMAL HIGH (ref 70–99)

## 2019-11-24 MED ORDER — LORAZEPAM 2 MG/ML IJ SOLN
2.0000 mg | INTRAMUSCULAR | Status: DC | PRN
  Administered 2019-11-25: 2 mg via INTRAVENOUS
  Filled 2019-11-24 (×2): qty 1

## 2019-11-24 MED ORDER — LEVETIRACETAM IN NACL 1500 MG/100ML IV SOLN
1500.0000 mg | Freq: Once | INTRAVENOUS | Status: AC
  Administered 2019-11-24: 1500 mg via INTRAVENOUS
  Filled 2019-11-24: qty 100

## 2019-11-24 MED ORDER — LEVETIRACETAM IN NACL 500 MG/100ML IV SOLN
500.0000 mg | Freq: Two times a day (BID) | INTRAVENOUS | Status: DC
  Administered 2019-11-24: 500 mg via INTRAVENOUS
  Filled 2019-11-24: qty 100

## 2019-11-24 NOTE — Progress Notes (Addendum)
0851 Patient with approximately 45second seizure @ 0833, whole body jerking, L sided gaze.  Dr. Zada Finders and this RN noticed jerking outside room and entered to observe patient. Patient HR into 150s.  MD Ostergard to discuss further plan of care with family.  MD Hortense Ramal paged to make aware 0845.   8022 Pt noted to have right sided jerking at shoulder with right sided gaze, HR in 120s, lasting approximately 30seconds.  MD Zada Finders and MD Hortense Ramal paged.

## 2019-11-24 NOTE — Progress Notes (Signed)
SLP Cancellation Note  Patient Details Name: [name] MRN: [id number] DOB: 1931/08/21   Cancelled treatment:       Reason Eval/Treat Not Completed: Medical issues which prohibited therapy. Pt with seizure this morning. Still not following commands per RN. Will hold PO trials this morning.    Osie Bond., M.A. [place] Acute Rehabilitation Services Pager [phone] Office [phone]  11/24/2019, 10:42 AM

## 2019-11-24 NOTE — Progress Notes (Signed)
EEG maintenance is complete. No skin breakdown seen at electrode site FP1 FP2 F7 Fz continue to monitor

## 2019-11-24 NOTE — Progress Notes (Addendum)
Neurosurgery Service Progress Note  Subjective: No acute events overnight, exam was stable this morning but shortly after eval had a GTC with left version / left gaze deviation, L>R tonic-clonic movement  Objective: Vitals:   11/24/19 0830 11/24/19 0833 11/24/19 0834 11/24/19 0900  BP: 122/68   (!) 150/69  Pulse: 93 (!) 151 (!) 130 (!) 103  Resp: (!) 21 (!) 33 (!) 22 16  Temp:      TempSrc:      SpO2: 99% (!) 88% (!) 88% 100%  Weight:      Height:       Temp (24hrs), Avg:100.2 F (37.9 C), Min:99.4 F (37.4 C), Max:100.8 F (38.2 C)  CBC Latest Ref Rng & Units 11/23/2019 11/22/2019 09/19/2019  WBC 4.0 - 10.5 K/uL 15.0(H) 13.0(H) 7.9  Hemoglobin 12.0 - 15.0 g/dL 11.4(L) 11.2(L) 9.4(L)  Hematocrit 36 - 46 % 35.2(L) 35.3(L) 31.4(L)  Platelets 150 - 400 K/uL 296 323 374   BMP Latest Ref Rng & Units 11/23/2019 11/22/2019 09/19/2019  Glucose 70 - 99 mg/dL 239(H) 201(H) 213(H)  BUN 8 - 23 mg/dL 17 26(H) 20  Creatinine 0.44 - 1.00 mg/dL 0.99 1.04(H) 1.14(H)  BUN/Creat Ratio 6 - 22 (calc) - - -  Sodium 135 - 145 mmol/L 136 135 126(L)  Potassium 3.5 - 5.1 mmol/L 3.6 3.8 4.5  Chloride 98 - 111 mmol/L 98 97(L) 94(L)  CO2 22 - 32 mmol/L 24 24 20(L)  Calcium 8.9 - 10.3 mg/dL 9.8 10.0 8.7(L)    Intake/Output Summary (Last 24 hours) at 11/24/2019 0909 Last data filed at 11/24/2019 0800 Gross per 24 hour  Intake 1723.66 ml  Output 625 ml  Net 1098.66 ml    Current Facility-Administered Medications:  .  0.9 %  sodium chloride infusion, , Intravenous, Continuous, Donnetta Gillin, Joyice Faster, MD, Last Rate: 75 mL/hr at 11/24/19 0800, Rate Verify at 11/24/19 0800 .  acetaminophen (TYLENOL) tablet 650 mg, 650 mg, Oral, Q4H PRN **OR** acetaminophen (TYLENOL) suppository 650 mg, 650 mg, Rectal, Q4H PRN, Judith Part, MD, 650 mg at 11/24/19 0907 .  chlorhexidine (PERIDEX) 0.12 % solution 15 mL, 15 mL, Mouth Rinse, BID, Nikesha Kwasny A, MD, 15 mL at 11/24/19 0900 .  Chlorhexidine Gluconate Cloth 2  % PADS 6 each, 6 each, Topical, Q0600, Judith Part, MD, 6 each at 11/23/19 1400 .  clevidipine (CLEVIPREX) infusion 0.5 mg/mL, 0-21 mg/hr, Intravenous, Continuous, Tamikia Chowning, Joyice Faster, MD, Last Rate: 4 mL/hr at 11/24/19 0902, 2 mg/hr at 11/24/19 0902 .  cloNIDine (CATAPRES) tablet 0.1 mg, 0.1 mg, Oral, Q1400, Judith Part, MD .  cloNIDine (CATAPRES) tablet 0.2 mg, 0.2 mg, Oral, BID, Laurna Shetley A, MD .  docusate sodium (COLACE) capsule 100 mg, 100 mg, Oral, BID, Zachary Lovins A, MD .  furosemide (LASIX) tablet 20 mg, 20 mg, Oral, Once per day on Mon Wed Fri, Katora Fini, Marcello Moores A, MD .  heparin injection 5,000 Units, 5,000 Units, Subcutaneous, Q8H, Judith Part, MD, 5,000 Units at 11/24/19 0615 .  insulin aspart (novoLOG) injection 0-9 Units, 0-9 Units, Subcutaneous, TID WC, Judith Part, MD, 2 Units at 11/24/19 0857 .  irbesartan (AVAPRO) tablet 150 mg, 150 mg, Oral, Daily, Rhesa Forsberg A, MD .  nitroGLYCERIN (NITROSTAT) SL tablet 0.4 mg, 0.4 mg, Sublingual, Q5 min PRN, Tavia Stave A, MD .  ondansetron (ZOFRAN) tablet 4 mg, 4 mg, Oral, Q4H PRN **OR** ondansetron (ZOFRAN) injection 4 mg, 4 mg, Intravenous, Q4H PRN, Judith Part, MD .  polyethylene glycol (MIRALAX / GLYCOLAX) packet 17 g, 17 g, Oral, Daily PRN, Judith Part, MD .  promethazine (PHENERGAN) tablet 12.5-25 mg, 12.5-25 mg, Oral, Q4H PRN, Judith Part, MD .  simvastatin (ZOCOR) tablet 20 mg, 20 mg, Oral, Q M,W,F, Garvis Downum, Joyice Faster, MD   Physical Exam: Eyes open spontaneously, makes eye contact but with right sided neglect, PERRL, not FC but MAEx4 to stimulus, localizes bilaterally in upper extremities  Assessment & Plan: 86 y.o. woman s/p fall, on plavix for CAD, CTH w/ bilateral parafalcine acute SDH.  Neuro -will d/w family re plan of care -continue continuous EEG -CTH grossly stable -start keppra 500bid, will trend renal function, GFR 52  yesterday  Cardiopulm -SBP<180, clevidipine gtt off, unable to take po clonidine, will see if she gets any rebound HTN today -will d/w family re intubation / goals of care given somnolence + new AED + Sz  FENGI -swallow failed 2/2 mental status, continue IVF, will d/w family re plan of care, will need dobhoff if continuing  -qd RFP -continue home Rx  Heme/ID -hold clopidogrel, no active issues  Dispo/PPx -hold SQH until tomorrow -keep in ICU  Judith Part  11/24/19 9:09 AM   Addendum: discussed with family, they confirmed she is DNI. I explained the high risk of respiratory compromise with active seizures and starting AEDs, they understand. Will see if she can maintain her airway and control seizures, if not then move towards comfort care.

## 2019-11-24 NOTE — Procedures (Addendum)
Patient Name: [name]  MRN: [id number]  Epilepsy Attending: Lora Havens  Referring Physician/Provider: Dr. Deatra Ina Duration: 11/23/2019 1157 to 11/24/2019 1157  Patient history: 86 year old female with bilateral parafalcine subdural hemorrhages.  EEG evaluate for seizures.  Level of alertness: lethargic  AEDs during EEG study: None  Technical aspects: This EEG study was done with scalp electrodes positioned according to the 10-20 International system of electrode placement. Electrical activity was acquired at a sampling rate of 500Hz  and reviewed with a high frequency filter of 70Hz  and a low frequency filter of 1Hz . EEG data were recorded continuously and digitally stored.   Description:  EEG showed continuous generalized and lateralized left hemisphere 3 to 6 Hz theta-delta slowing.  Periodic lateralized epileptiform discharges were also seen in left hemisphere at 1 to 1.5 Hz.    Event button was pressed on 11/24/2019 at 833.  Around 829, patient was being examined by the physician but was noted to be unable to answer any questions or follow commands.  Gradually she was noted to have right mouth deviation, chewing movements, repetitive blinking and possible right gaze deviation with subtle bilateral upper extremity twitching (bilateral lower extremity were difficult to visualize on camera) which then evolved into bilateral lower extremity tonic extension.  This was followed by clonic movements of bilateral upper and lower extremity (right more than left).  Concomitant EEG initially showed 5 to 6 Hz theta slowing admixed with sharp waves in left frontotemporal region (max F7/T7) which gradually evolved to involve the right hemisphere.  After the seizure ended patient continued to have periodic lateralized epileptiform discharges at 1.5 to 2 Hz in left hemisphere.  Event button was again pressed on 11/24/2019 at 1046. At the beginning of seizure at 1043,  there were no  clinical signs. Around 1044, patient was noted to have right facial/mouth twitching,  Concomitant EEG initially showed 5 to 6 Hz theta slowing admixed with sharp waves in left frontotemporal region (max F7/T7) which gradually evolved to involve the right hemisphere.  After the seizure ended patient continued to have periodic lateralized epileptiform discharges at 1.5 to 2 Hz in left hemisphere.  Multiple subclinical seizures were seen throughout the study arising from left frontotemporal region, average 1/h, lasting about about 30 seconds.  ABNORMALITY -Seizures, left frontotemporal region -Periodic lateralized epileptiform discharges, left hemisphere -Continuous slow, generalized and lateralized left hemisphere  IMPRESSION: This study showed multiple seizures arising from left frontotemporal region as described above.  There is also evidence of epileptogenicity as well as cortical dysfunction in left hemisphere likely secondary to underlying structural abnormality.  Additionally, there is evidence of severe diffuse encephalopathy, non specific to etiology.  Dr Zada Finders was notified.  Yesenia Locurto [name]

## 2019-11-25 DIAGNOSIS — R001 Bradycardia, unspecified: Secondary | ICD-10-CM

## 2019-11-25 DIAGNOSIS — E43 Unspecified severe protein-calorie malnutrition: Secondary | ICD-10-CM | POA: Diagnosis present

## 2019-11-25 LAB — RENAL FUNCTION PANEL
Albumin: 3.3 g/dL — ABNORMAL LOW (ref 3.5–5.0)
Anion gap: 16 — ABNORMAL HIGH (ref 5–15)
BUN: 25 mg/dL — ABNORMAL HIGH (ref 8–23)
CO2: 22 mmol/L (ref 22–32)
Calcium: 9.3 mg/dL (ref 8.9–10.3)
Chloride: 107 mmol/L (ref 98–111)
Creatinine, Ser: 1.09 mg/dL — ABNORMAL HIGH (ref 0.44–1.00)
GFR calc Af Amer: 54 mL/min — ABNORMAL LOW (ref >60)
GFR calc non Af Amer: 46 mL/min — ABNORMAL LOW (ref >60)
Glucose, Bld: 125 mg/dL — ABNORMAL HIGH (ref 70–99)
Phosphorus: 2.8 mg/dL (ref 2.5–4.6)
Potassium: 3 mmol/L — ABNORMAL LOW (ref 3.5–5.1)
Sodium: 145 mmol/L (ref 135–145)

## 2019-11-25 LAB — TRIGLYCERIDES
Triglycerides: 2893 mg/dL — ABNORMAL HIGH (ref <150)
Triglycerides: 229 mg/dL — ABNORMAL HIGH (ref <150)

## 2019-11-25 LAB — PHOSPHORUS: Phosphorus: 2.9 mg/dL (ref 2.5–4.6)

## 2019-11-25 LAB — GLUCOSE, CAPILLARY
Glucose-Capillary: 166 mg/dL — ABNORMAL HIGH (ref 70–99)
Glucose-Capillary: 112 mg/dL — ABNORMAL HIGH (ref 70–99)
Glucose-Capillary: 158 mg/dL — ABNORMAL HIGH (ref 70–99)
Glucose-Capillary: 165 mg/dL — ABNORMAL HIGH (ref 70–99)
Glucose-Capillary: 162 mg/dL — ABNORMAL HIGH (ref 70–99)
Glucose-Capillary: 87 mg/dL (ref 70–99)

## 2019-11-25 LAB — MAGNESIUM: Magnesium: 1.9 mg/dL (ref 1.7–2.4)

## 2019-11-25 MED ORDER — CLONIDINE HCL 0.2 MG PO TABS
0.2000 mg | ORAL_TABLET | Freq: Two times a day (BID) | ORAL | Status: DC
  Administered 2019-11-25: 0.2 mg
  Filled 2019-11-25: qty 1

## 2019-11-25 MED ORDER — OSMOLITE 1.2 CAL PO LIQD
1000.0000 mL | ORAL | Status: DC
  Administered 2019-11-25 – 2019-11-29 (×4): 1000 mL
  Filled 2019-11-25 (×6): qty 1000

## 2019-11-25 MED ORDER — DOCUSATE SODIUM 50 MG/5ML PO LIQD
100.0000 mg | Freq: Two times a day (BID) | ORAL | Status: DC
  Administered 2019-11-25 – 2019-12-08 (×22): 100 mg
  Filled 2019-11-25 (×22): qty 10

## 2019-11-25 MED ORDER — POTASSIUM CHLORIDE 20 MEQ PO PACK
40.0000 meq | PACK | Freq: Once | ORAL | Status: AC
  Administered 2019-11-25: 40 meq
  Filled 2019-11-25: qty 2

## 2019-11-25 MED ORDER — LEVETIRACETAM IN NACL 1000 MG/100ML IV SOLN
1000.0000 mg | Freq: Two times a day (BID) | INTRAVENOUS | Status: DC
  Administered 2019-11-25 – 2019-12-08 (×27): 1000 mg via INTRAVENOUS
  Filled 2019-11-25 (×28): qty 100

## 2019-11-25 MED ORDER — FUROSEMIDE 20 MG PO TABS
20.0000 mg | ORAL_TABLET | ORAL | Status: DC
  Administered 2019-11-28 – 2019-12-07 (×5): 20 mg
  Filled 2019-11-25 (×5): qty 1

## 2019-11-25 MED ORDER — ENALAPRILAT 1.25 MG/ML IV SOLN
0.6250 mg | Freq: Four times a day (QID) | INTRAVENOUS | Status: DC | PRN
  Administered 2019-11-26 – 2019-11-28 (×5): 0.625 mg via INTRAVENOUS
  Filled 2019-11-25 (×3): qty 1
  Filled 2019-11-25: qty 0.5
  Filled 2019-11-25 (×2): qty 1

## 2019-11-25 MED ORDER — VALPROATE SODIUM 500 MG/5ML IV SOLN
250.0000 mg | Freq: Three times a day (TID) | INTRAVENOUS | Status: DC
  Administered 2019-11-25 – 2019-12-08 (×38): 250 mg via INTRAVENOUS
  Filled 2019-11-25 (×42): qty 2.5

## 2019-11-25 MED ORDER — PROSOURCE TF PO LIQD
45.0000 mL | Freq: Two times a day (BID) | ORAL | Status: DC
  Administered 2019-11-25 – 2019-11-30 (×11): 45 mL
  Filled 2019-11-25 (×11): qty 45

## 2019-11-25 MED ORDER — LEVETIRACETAM IN NACL 500 MG/100ML IV SOLN
500.0000 mg | Freq: Once | INTRAVENOUS | Status: AC
  Administered 2019-11-25: 500 mg via INTRAVENOUS
  Filled 2019-11-25: qty 100

## 2019-11-25 MED ORDER — VALPROATE SODIUM 500 MG/5ML IV SOLN
1000.0000 mg | Freq: Once | INTRAVENOUS | Status: AC
  Administered 2019-11-25: 1000 mg via INTRAVENOUS
  Filled 2019-11-25: qty 10

## 2019-11-25 MED ORDER — POTASSIUM CHLORIDE 20 MEQ PO PACK: 40.0000 meq | PACK | Freq: Once | ORAL | Status: DC

## 2019-11-25 MED ORDER — SIMVASTATIN 20 MG PO TABS
20.0000 mg | ORAL_TABLET | ORAL | Status: DC
  Administered 2019-11-28 – 2019-12-07 (×5): 20 mg
  Filled 2019-11-25 (×6): qty 1

## 2019-11-25 MED ORDER — ATROPINE SULFATE 1 MG/10ML IJ SOSY
PREFILLED_SYRINGE | INTRAMUSCULAR | Status: AC
  Filled 2019-11-25: qty 10

## 2019-11-25 MED ORDER — CLONIDINE HCL 0.1 MG PO TABS
0.1000 mg | ORAL_TABLET | Freq: Every day | ORAL | Status: DC
  Administered 2019-11-25: 0.1 mg
  Filled 2019-11-25: qty 1

## 2019-11-25 NOTE — Progress Notes (Signed)
SLP Cancellation Note  Patient Details Name: [name] MRN: [id number] DOB: [birth year]   Cancelled treatment:       Reason Eval/Treat Not Completed: Medical issues which prohibited therapy;Fatigue/lethargy limiting ability to participate. Pt not appropriate for PO trials today per RN. She has had more seizure activity and is lethargic. She does have a Cortrak now, so SLP will plan to f/u early next week to see if she is ready to try POs again. Please contact the weekend pager [phone] if needed before then.    Osie Bond., M.A. [place] Acute Rehabilitation Services Pager [phone] Office [phone]  11/25/2019, 2:14 PM

## 2019-11-25 NOTE — Consult Note (Addendum)
Neurology Consultation  Reason for Consult: Choice of additional antiepileptic Referring Physician: Dr. [name]  CC: Question about antiepileptic choice  History is obtained from: Chart  HPI: [name] is a 86 y.o. female with history of diabetes, stroke, myocardial infarct, renal artery stenosis, hypertension, chronic kidney disease and CAD.  Patient initially presented to the emergency department after a fall with progressive confusion.  Patient was clopidogrel.  CT head was obtained showing a large parafalcine subdural hematoma with mild mass-effect asymmetrical involving the left cerebral hemisphere with mild effacement of the left lateral ventricle and minimal left to right midline shift.  Neurosurgery was involved.  EEG was obtained on 8/12 which showed left frontal temporal seizures along with periodic lateralization epileptiform discharges, left hemisphere along with continuous slow, generalized and lateralized left hemisphere.  Patient was placed on LTM.  Reading today on 11/25/2019 continue to show evidence of bilateral independent periodic lateralized epileptiform discharges in both left and right hemisphere.  Neurosurgery contacted neurology for aid in choosing a second agent due to continued seizures.  Past Medical History:  Diagnosis Date   BPPV (benign paroxysmal positional vertigo) 01/08/2016   Bradycardia    a. Coreg decreased due to HR low 40s on event monitor 07-25-2015 -> further decreased due to HR upper 30s in 01/2016.   Breast nodule 02/15/2019   CAD (coronary artery disease)    a. RV infarct 07-24-05 c/b high grade AV block with total occlusion of RCA, unable to treat with PCI. b. Nuc 10/2015: scar but no ischemia.   Carotid artery disease (HCC)    Chronic anemia    Chronic lower back pain    CKD (chronic kidney disease), stage III    Complication of anesthesia    "almost died when I had my breast reduction"   Diabetes ([place]) 07-24-00   Dyslipidemia    Gait  abnormality 09/06/2019   GERD (gastroesophageal reflux disease)    Heart murmur    High cholesterol    History of hiatal hernia    HTN (hypertension)    Hyperthyroidism    Memory problem 02/15/2019   Meniere disease    "for years" takes Antivert 3 times per day (01/31/2016)   Myocardial infarction [facility]) 07/24/2005   "almost died"   Near syncope    Osteoporosis    Presbycusis of both ears 01/29/2016   Renal artery stenosis (HCC)    a. left renal artery stent 07-25-10. b. s/p repeat left renal stenting 01/2016.   Stroke [facility])    a. Prior infarcts seen on head CT.   TMJ pain dysfunction syndrome 08/26/2016   Type II diabetes mellitus ([place])    "controlled w/diet and exercise only" (01/31/2016)   Weakness 02/26/2015    Family History  Problem Relation Age of Onset   Diabetes type II Mother    Heart attack Mother    Heart disease Mother    Stroke Mother    Congestive Heart Failure Father    Pulmonary embolism Father    Alzheimer's disease Sister    Heart disease Sister    Heart disease Brother    Healthy Sister    Healthy Sister    Stroke Brother    Dementia Brother        VASCULAR   Stroke Brother    Diabetes type II Brother    Other Brother        KILLED IN PLANE CRASH   Pulmonary disease Sister    Heart attack Sister  Kidney failure Sister    Diabetes type II Sister    Diabetes type II Sister    Kidney failure Sister    Social History:   reports that she has quit smoking. Her smoking use included cigarettes. She has a 30.00 pack-year smoking history. She has never used smokeless tobacco. She reports that she does not drink alcohol and does not use drugs.  Medications  Current Facility-Administered Medications:    0.9 %  sodium chloride infusion, , Intravenous, Continuous, Ostergard, Joyice Faster, MD, Last Rate: 75 mL/hr at 11/25/19 1500, Rate Verify at 11/25/19 1500   acetaminophen (TYLENOL) tablet 650 mg, 650 mg, Oral, Q4H PRN **OR**  acetaminophen (TYLENOL) suppository 650 mg, 650 mg, Rectal, Q4H PRN, Judith Part, MD, 650 mg at 11/24/19 1539   chlorhexidine (PERIDEX) 0.12 % solution 15 mL, 15 mL, Mouth Rinse, BID, Ostergard, Joyice Faster, MD, 15 mL at 11/25/19 8563   Chlorhexidine Gluconate Cloth 2 % PADS 6 each, 6 each, Topical, Q0600, Judith Part, MD, 6 each at 11/25/19 0900   clevidipine (CLEVIPREX) infusion 0.5 mg/mL, 0-21 mg/hr, Intravenous, Continuous, Ostergard, Joyice Faster, MD, Last Rate: 10 mL/hr at 11/25/19 1500, 5 mg/hr at 11/25/19 1500   cloNIDine (CATAPRES) tablet 0.1 mg, 0.1 mg, Per Tube, Q1400, Judith Part, MD, 0.1 mg at 11/25/19 1511   cloNIDine (CATAPRES) tablet 0.2 mg, 0.2 mg, Per Tube, BID, Ostergard, Joyice Faster, MD   docusate (COLACE) 50 MG/5ML liquid 100 mg, 100 mg, Per Tube, BID, Judith Part, MD, 100 mg at 11/25/19 1511   enalaprilat (VASOTEC) injection 0.625 mg, 0.625 mg, Intravenous, Q6H PRN, Judith Part, MD   feeding supplement (OSMOLITE 1.2 CAL) liquid 1,000 mL, 1,000 mL, Per Tube, Continuous, Ostergard, Joyice Faster, MD   feeding supplement (PROSource TF) liquid 45 mL, 45 mL, Per Tube, BID, Judith Part, MD   [START ON 11/28/2019] furosemide (LASIX) tablet 20 mg, 20 mg, Per Tube, Once per day on Mon Wed Fri, Ostergard, Joyice Faster, MD   heparin injection 5,000 Units, 5,000 Units, Subcutaneous, Q8H, Ostergard, Joyice Faster, MD, 5,000 Units at 11/25/19 1512   insulin aspart (novoLOG) injection 0-9 Units, 0-9 Units, Subcutaneous, TID WC, Ostergard, Joyice Faster, MD, 2 Units at 11/25/19 0751   irbesartan (AVAPRO) tablet 150 mg, 150 mg, Oral, Daily, Ostergard, Joyice Faster, MD   levETIRAcetam (KEPPRA) IVPB 1000 mg/100 mL premix, 1,000 mg, Intravenous, Q12H, Ostergard, Joyice Faster, MD, Stopped at 11/25/19 1121   LORazepam (ATIVAN) injection 2 mg, 2 mg, Intravenous, Q2H PRN, Judith Part, MD, 2 mg at 11/25/19 1497   nitroGLYCERIN (NITROSTAT) SL tablet 0.4 mg, 0.4 mg,  Sublingual, Q5 min PRN, Ostergard, Joyice Faster, MD   ondansetron (ZOFRAN) tablet 4 mg, 4 mg, Oral, Q4H PRN **OR** ondansetron (ZOFRAN) injection 4 mg, 4 mg, Intravenous, Q4H PRN, Ostergard, Thomas A, MD   polyethylene glycol (MIRALAX / GLYCOLAX) packet 17 g, 17 g, Oral, Daily PRN, Judith Part, MD   potassium chloride (KLOR-CON) packet 40 mEq, 40 mEq, Per Tube, Once, Ostergard, Joyice Faster, MD   promethazine (PHENERGAN) tablet 12.5-25 mg, 12.5-25 mg, Oral, Q4H PRN, Judith Part, MD   [START ON 11/28/2019] simvastatin (ZOCOR) tablet 20 mg, 20 mg, Per Tube, Q M,W,F, Ostergard, Thomas A, MD  ROS:  Unable to obtain due sedation on propofol.    Exam: Current vital signs: BP (!) 165/57    Pulse 99    Temp 99.5 F (37.5 C) (Axillary)    Resp [name])  21    Ht 5' (1.524 m)    Wt 47 kg    SpO2 97%    BMI 20.24 kg/m  Vital signs in last 24 hours: Temp:  [98.8 F (37.1 C)-100.5 F (38.1 C)] 99.5 F (37.5 C) (08/13 1200) Pulse Rate:  [71-119] 99 (08/13 1535) Resp:  [11-38] 21 (08/13 1535) BP: (101-199)/(43-142) 165/57 (08/13 1535) SpO2:  [94 %-100 %] 97 % (08/13 1535)   Constitutional: Appears thin.  Eyes: No scleral injection HENT: No OP obstrucion Head: Normocephalic.  Cardiovascular: Palpable.  Respiratory: Normal effort GI: Soft.  No distension. There is no tenderness.  Skin: WDI  Neuro: Mental Status: Patient is somnolent.  No active seizures are noted. Cranial Nerves: II: No blink to threat III,IV, VI: Doll's reflex intact. Pupils equal, round and reactive to light V: Face appears symmetrical    Motor: With noxious stimuli patient is able to move bilateral legs antigravity and also localizes with left arm antigravity. Sensory: Sensation is intact to noxious stimuli throughout.  As noted above and also with right arm although she does not move the arm she does wince Deep Tendon Reflexes: 2+ and symmetric in the biceps and patellae.  Plantars: Upgoing  bilaterally   CBC    Component Value Date/Time   WBC 15.0 (H) 11/23/2019 0808   RBC 4.20 11/23/2019 0808   HGB 11.4 (L) 11/23/2019 0808   HGB 9.2 (L) 02/25/2019 1339   HCT 35.2 (L) 11/23/2019 0808   HCT 29.8 (L) 02/25/2019 1339   PLT 296 11/23/2019 0808   PLT 503 (H) 02/25/2019 1339   MCV 83.8 11/23/2019 0808   MCV 75 (L) 02/25/2019 1339   MCH 27.1 11/23/2019 0808   MCHC 32.4 11/23/2019 0808   RDW 14.6 11/23/2019 0808   RDW 17.1 (H) 02/25/2019 1339   LYMPHSABS 1.3 11/22/2019 1406   LYMPHSABS 1.5 02/25/2019 1339   MONOABS 0.7 11/22/2019 1406   EOSABS 0.0 11/22/2019 1406   EOSABS 0.1 02/25/2019 1339   BASOSABS 0.0 11/22/2019 1406   BASOSABS 0.0 02/25/2019 1339    CMP     Component Value Date/Time   NA 145 11/25/2019 1044   NA 130 (L) 03/25/2019 1554   K 3.0 (L) 11/25/2019 1044   CL 107 11/25/2019 1044   CO2 22 11/25/2019 1044   GLUCOSE 125 (H) 11/25/2019 1044   BUN 25 (H) 11/25/2019 1044   BUN 20 03/25/2019 1554   CREATININE 1.09 (H) 11/25/2019 1044   CREATININE 1.02 (H) 08/17/2019 1110   CALCIUM 9.3 11/25/2019 1044   PROT 7.3 11/22/2019 1406   ALBUMIN 3.3 (L) 11/25/2019 1044   AST 21 11/22/2019 1406   ALT 14 11/22/2019 1406   ALKPHOS 57 11/22/2019 1406   BILITOT 0.5 11/22/2019 1406   GFRNONAA 46 (L) 11/25/2019 1044   GFRNONAA 50 (L) 08/17/2019 1110   GFRAA 54 (L) 11/25/2019 1044   GFRAA 58 (L) 08/17/2019 1110    Lipid Panel     Component Value Date/Time   CHOL 192 08/17/2019 1110   TRIG 229 (H) 11/25/2019 1234   HDL 48 (L) 08/17/2019 1110   CHOLHDL 4.0 08/17/2019 1110   VLDL UNABLE TO CALCULATE IF TRIGLYCERIDE OVER 400 mg/dL 05/10/2008 0635   LDLCALC 124 (H) 08/17/2019 1110     Imaging I have reviewed the images obtained:  CT-scan of the brain-Parafalcine subdural hematoma has largely remained stable as compared to the head CT of 11/22/2019, again measuring up to 2.6 cm in thickness anteriorly. However, there  is a new 10 mm lobular extension of  extra-axial hemorrhage in the left anterior parafalcine region. Subdural hematoma along the bilateral tentorium and left greater than right anterior cerebral hemispheres is unchanged.   As before, there is asymmetric mass effect upon the left cerebral hemisphere with partial effacement of the left lateral ventricle and 4 mm rightward midline shift.  Etta Quill PA-C [facility] [phone]  M-F  (9:00 am- 5:00 PM)  11/25/2019, 3:59 PM     Assessment:  This is a 86 year old female with subdural hematoma who currently is suffering from seizures.  Currently patient is on 1000 mg twice daily, however due to concern for ongoing seizures, Depakote was loaded and a standing dose was added.  Exam as above.  Recommendations: -Stop EEG -Continue Depakote at 250 mg 3 times daily             - follow-up stable level, pharmacy following as well (5 AM 8/16) -Continue Keppra 1000 mg twice daily -Neuro monitoring per primary team -Blood pressure control and heart rate management by primary team  Attending Neurologist's note:  I personally saw this patient, gathering history, performing a full neurologic examination, reviewing relevant labs, personally reviewing relevant imaging including head CT and formulated the assessment and plan, adding the note above for completeness and clarity to accurately reflect my thoughts

## 2019-11-25 NOTE — Consult Note (Deleted)
Neurology Consultation Reason for Consult: Seizure management Referring Physician: Emelda Brothers  CC: altered mental status  History is obtained from: Chart review  HPI: [name] is a 86 y.o. female presenting with a parafalcine subdural hemorrhage in the setting of a fall on antiplatelet agents (Plavix).  Her past medical history is significant for coronary artery disease 07/11/05 right ventricular infarct complicated by high-grade AV block with bradycardia to the 30s), CKD, diabetes, dyslipidemia, hypertension, osteoporosis and past smoking history (30 pack years)  Per chart review, she came in due to progressive confusion after a fall.  In the ED she was vomiting and rushed to the CT scanner which revealed parafalcine hemorrhage.  She is being expectantly managed with plan for surgical intervention should her clinical exam decline.  She was initially loaded with Keppra 1500 mg with 500 mg twice daily scheduled which was then increased to 1000 mg twice daily today (essentially received another 1500s as a second loading dose)  Given difficult to control seizures, neurology was consulted  ROS: Unable to obtain due to altered mental status.   Past Medical History:  Diagnosis Date  . BPPV (benign paroxysmal positional vertigo) 01/08/2016  . Bradycardia    a. Coreg decreased due to HR low 40s on event monitor 07/12/15 -> further decreased due to HR upper 30s in 01/2016.  . Breast nodule 02/15/2019  . CAD (coronary artery disease)    a. RV infarct 07/11/05 c/b high grade AV block with total occlusion of RCA, unable to treat with PCI. b. Nuc 10/2015: scar but no ischemia.  . Carotid artery disease ([place])   . Chronic anemia   . Chronic lower back pain   . CKD (chronic kidney disease), stage III   . Complication of anesthesia    "almost died when I had my breast reduction"  . Diabetes ([place]) 2000/07/11  . Dyslipidemia   . Gait abnormality 09/06/2019  . GERD (gastroesophageal reflux disease)   . Heart  murmur   . High cholesterol   . History of hiatal hernia   . HTN (hypertension)   . Hyperthyroidism   . Memory problem 02/15/2019  . Meniere disease    "for years" takes Antivert 3 times per day (01/31/2016)  . Myocardial infarction [facility]) 11-Jul-2005   "almost died"  . Near syncope   . Osteoporosis   . Presbycusis of both ears 01/29/2016  . Renal artery stenosis (HCC)    a. left renal artery stent 2010/07/12. b. s/p repeat left renal stenting 01/2016.  . Stroke [facility])    a. Prior infarcts seen on head CT.  . TMJ pain dysfunction syndrome 08/26/2016  . Type II diabetes mellitus ([place])    "controlled w/diet and exercise only" (01/31/2016)  . Weakness 02/26/2015    Family History  Problem Relation Age of Onset  . Diabetes type II Mother   . Heart attack Mother   . Heart disease Mother   . Stroke Mother   . Congestive Heart Failure Father   . Pulmonary embolism Father   . Alzheimer's disease Sister   . Heart disease Sister   . Heart disease Brother   . Healthy Sister   . Healthy Sister   . Stroke Brother   . Dementia Brother        VASCULAR  . Stroke Brother   . Diabetes type II Brother   . Other Brother        KILLED IN PLANE CRASH  . Pulmonary disease Sister   .  Heart attack Sister   . Kidney failure Sister   . Diabetes type II Sister   . Diabetes type II Sister   . Kidney failure Sister    Social History:  reports that she has quit smoking. Her smoking use included cigarettes. She has a 30.00 pack-year smoking history. She has never used smokeless tobacco. She reports that she does not drink alcohol and does not use drugs.   Exam: Current vital signs: BP (!) 184/142   Pulse (!) 101   Temp 99.5 F (37.5 C) (Axillary)   Resp 20   Ht 5' (1.524 m)   Wt 47 kg   SpO2 96%   BMI 20.24 kg/m  Vital signs in last 24 hours: Temp:  [98.8 F (37.1 C)-100.5 F (38.1 C)] 99.5 F (37.5 C) (08/13 1200) Pulse Rate:  [71-119] 101 (08/13 1455) Resp:  [11-38] 20 (08/13 1455) BP:  (101-199)/(43-142) 184/142 (08/13 1511) SpO2:  [94 %-100 %] 96 % (08/13 1455)   Physical Exam  Constitutional: Appears well-developed and well-nourished.  Psych: Affect appropriate to situation Eyes: No scleral injection HENT: No OP obstrucion MSK: no joint deformities.  Cardiovascular: Normal rate and regular rhythm.  Respiratory: Effort normal, non-labored breathing GI: Soft.  No distension. There is no tenderness.  Skin: WDI  Mental Status: Patient is not intubated.  No active seizures are noted. Cranial Nerves: II: No blink to threat III,IV, VI: Doll's reflex intact. Pupils equal, round and reactive to light V: Face appears symmetrical    Motor: With noxious stimuli patient is able to move bilateral legs antigravity and also localizes with left arm antigravity. Sensory: Sensation is intact to noxious stimuli throughout.  As noted above and also with right arm although she does not move the arm she does wince Deep Tendon Reflexes: 2+ and symmetric in the biceps and patellae.  Plantars: Upgoing bilaterally   I have reviewed labs in epic and the results pertinent to this consultation are: Creatinine clearance 27.1, creatinine 1.1, GFR 46 Platelets 296  I have reviewed the images obtained: CT head demonstrates parafalcine subdural hematoma that is slightly extended on serial scan   Impression: This is a 86 year old female with subdural hematoma who currently is suffering from seizures.  Currently patient is on 1000 mg Keppra twice daily which is already above the recommended range for her creatinine clearance is inadequately controlling her seizures.  We will not dose reduce the Keppra at this time but will add a second agent.  Given her bradycardia, Vimpat is contraindicated, and phenytoin toxicity is also associated with significant bradycardia.  Therefore we will trial Depakote. Per Lexicomp data, valproic acid does not be need to be renally dosed unless creatinine  clearance is less than 10.  There is some controversy over whether Depakote can contribute to a coagulopathy, so the patient will require continued close neurological monitoring.  Recommendations: -Continue long-term EEG monitoring -Continue Keppra 1000 mg twice daily In consultation with pharmacy, will add the following -Load Depakote 20 mg/kg (1000 mg) at a rate of 2.5 mg/kg/minute -250 mg q8hr, check level prior to the 1st dose and 4th dose   Appreciate assistance of Etta Quill and documenting examination; I personally examined the patient, reviewed all data and formulated the plan. Discussed with neurosurgery, critical care for c/f status epilepticus.  Lesleigh Noe MD-PhD [facility] [phone]

## 2019-11-25 NOTE — Progress Notes (Signed)
Seen on PM rounds, exam stable, no changes in plan of care.  -neurology c/s'd for assistance with AED mgmt, started valproate. EEG at bedside significantly dec'd amplitude this afternoon, continue LTM -repleted K today, RFP pending tomorrow morning

## 2019-11-25 NOTE — Progress Notes (Signed)
Initial Nutrition Assessment  DOCUMENTATION CODES:   Severe malnutrition in context of chronic illness  INTERVENTION:   Initiate tube feeding via Cortrak: Osmolite 1.2 at 40 ml/h (960 ml per day) Prosource TF 45 ml BID  Provides 1232 kcal, 75 gm protein, 778 ml free water daily  TF regimen and cleviprex at current rate providing 1808 total kcal/day    NUTRITION DIAGNOSIS:   Severe Malnutrition related to chronic illness (CAD, CKD, DM) as evidenced by severe fat depletion, severe muscle depletion.  GOAL:   Patient will meet greater than or equal to 90% of their needs  MONITOR:   TF tolerance  REASON FOR ASSESSMENT:   Consult Enteral/tube feeding initiation and management  ASSESSMENT:   Pt with PMH of CAD, HTN, HLD, DM, stroke, CKD, and GERD admitted s/p multiple falls with acute bilateral SDH.   Pt discussed during ICU rounds and with RN.  Husband at bedside. He reports no recent weight changes. He reports she eats [employer].  Pt meets criteria for severe malnutrition on admission. Plan to start enteral nutrition.   Medications reviewed and include: colace, lasix, SSI  Cleviprex @ 12 ml/hr provides: 576 kcal  Labs reviewed: TG: 229, K+ 3     NUTRITION - FOCUSED PHYSICAL EXAM:    Most Recent Value  Orbital Region Severe depletion  Upper Arm Region Severe depletion  Thoracic and Lumbar Region Severe depletion  Buccal Region Moderate depletion  Temple Region Severe depletion  Clavicle Bone Region Severe depletion  Clavicle and Acromion Bone Region Severe depletion  Scapular Bone Region Severe depletion  Dorsal Hand Severe depletion  Patellar Region Severe depletion  Anterior Thigh Region Severe depletion  Posterior Calf Region Severe depletion  Edema (RD Assessment) None  Hair Reviewed  Eyes Unable to assess  Mouth Unable to assess  Skin Reviewed  Nails Reviewed       Diet Order:   Diet Order            Diet NPO time specified  Diet  effective now                 EDUCATION NEEDS:   No education needs have been identified at this time  Skin:  Skin Assessment: Reviewed RN Assessment  Last BM:  unknown  Height:   Ht Readings from Last 1 Encounters:  11/22/19 5' (1.524 m)    Weight:   Wt Readings from Last 1 Encounters:  11/22/19 47 kg    Ideal Body Weight:  45.4 kg  BMI:  Body mass index is 20.24 kg/m.  Estimated Nutritional Needs:   Kcal:  1300-1500  Protein:  70-80 grams  Fluid:  > 1.5 L/day  Lockie Pares., RD, LDN, CNSC See AMiON for contact information

## 2019-11-25 NOTE — Procedures (Signed)
Cortrak  Person Inserting Tube:  [name], RD Tube Type:  Cortrak - 43 inches Tube Location:  Left nare Initial Placement:  Stomach Secured by: Bridle Technique Used to Measure Tube Placement:  Documented cm marking at nare/ corner of mouth Cortrak Secured At:  68 cm Procedure Comments:  Cortrak Tube Team Note:  Consult received to place a Cortrak feeding tube.   No x-ray is required. RN may begin using tube.    If the tube becomes dislodged please keep the tube and contact the Cortrak team at [email or web address] ([email or web address]) for replacement.  If after hours and replacement cannot be delayed, place a NG tube and confirm placement with an abdominal x-ray.      Cathy Matin, MS, RD, LDN, CNSC Inpatient Clinical Dietitian RD pager # available in [place]  After hours/weekend pager # available in [facility]

## 2019-11-25 NOTE — Progress Notes (Addendum)
Neurosurgery Service Progress Note  Subjective: No acute events overnight, no report of clinical seizures for nursing or concerning epileptiform movements  Objective: Vitals:   11/25/19 0430 11/25/19 0500 11/25/19 0530 11/25/19 0600  BP: (!) 130/55 (!) 150/62 (!) 155/66 (!) 155/53  Pulse: 75 97 (!) 107 90  Resp: 11 19 (!) 21 15  Temp:      TempSrc:      SpO2: 100% 100% 99% 94%  Weight:      Height:       Temp (24hrs), Avg:100.2 F (37.9 C), Min:99.4 F (37.4 C), Max:100.5 F (38.1 C)  CBC Latest Ref Rng & Units 11/23/2019 11/22/2019 09/19/2019  WBC 4.0 - 10.5 K/uL 15.0(H) 13.0(H) 7.9  Hemoglobin 12.0 - 15.0 g/dL 11.4(L) 11.2(L) 9.4(L)  Hematocrit 36 - 46 % 35.2(L) 35.3(L) 31.4(L)  Platelets 150 - 400 K/uL 296 323 374   BMP Latest Ref Rng & Units 11/23/2019 11/22/2019 09/19/2019  Glucose 70 - 99 mg/dL 239(H) 201(H) 213(H)  BUN 8 - 23 mg/dL 17 26(H) 20  Creatinine 0.44 - 1.00 mg/dL 0.99 1.04(H) 1.14(H)  BUN/Creat Ratio 6 - 22 (calc) - - -  Sodium 135 - 145 mmol/L 136 135 126(L)  Potassium 3.5 - 5.1 mmol/L 3.6 3.8 4.5  Chloride 98 - 111 mmol/L 98 97(L) 94(L)  CO2 22 - 32 mmol/L 24 24 20(L)  Calcium 8.9 - 10.3 mg/dL 9.8 10.0 8.7(L)    Intake/Output Summary (Last 24 hours) at 11/25/2019 8338 Last data filed at 11/25/2019 0600 Gross per 24 hour  Intake 2070.87 ml  Output --  Net 2070.87 ml    Current Facility-Administered Medications:  .  0.9 %  sodium chloride infusion, , Intravenous, Continuous, Mackenna Kamer, Joyice Faster, MD, Last Rate: 75 mL/hr at 11/25/19 0600, Rate Verify at 11/25/19 0600 .  acetaminophen (TYLENOL) tablet 650 mg, 650 mg, Oral, Q4H PRN **OR** acetaminophen (TYLENOL) suppository 650 mg, 650 mg, Rectal, Q4H PRN, Judith Part, MD, 650 mg at 11/24/19 1539 .  chlorhexidine (PERIDEX) 0.12 % solution 15 mL, 15 mL, Mouth Rinse, BID, Daryel Kenneth, Joyice Faster, MD, 15 mL at 11/24/19 2114 .  Chlorhexidine Gluconate Cloth 2 % PADS 6 each, 6 each, Topical, Q0600, Judith Part, MD, 6 each at 11/24/19 0910 .  clevidipine (CLEVIPREX) infusion 0.5 mg/mL, 0-21 mg/hr, Intravenous, Continuous, Braylan Faul A, MD, Last Rate: 12 mL/hr at 11/25/19 0600, 6 mg/hr at 11/25/19 0600 .  cloNIDine (CATAPRES) tablet 0.1 mg, 0.1 mg, Oral, Q1400, Judith Part, MD .  cloNIDine (CATAPRES) tablet 0.2 mg, 0.2 mg, Oral, BID, Kymiah Araiza A, MD .  docusate sodium (COLACE) capsule 100 mg, 100 mg, Oral, BID, Declyn Delsol A, MD .  furosemide (LASIX) tablet 20 mg, 20 mg, Oral, Once per day on Mon Wed Fri, Marvelle Span, Marcello Moores A, MD .  heparin injection 5,000 Units, 5,000 Units, Subcutaneous, Q8H, Judith Part, MD, 5,000 Units at 11/25/19 0542 .  insulin aspart (novoLOG) injection 0-9 Units, 0-9 Units, Subcutaneous, TID WC, Asma Boldon, Joyice Faster, MD, 2 Units at 11/24/19 1315 .  irbesartan (AVAPRO) tablet 150 mg, 150 mg, Oral, Daily, Sophya Vanblarcom A, MD .  levETIRAcetam (KEPPRA) IVPB 500 mg/100 mL premix, 500 mg, Intravenous, Q12H, Judith Part, MD, Stopped at 11/24/19 2129 .  LORazepam (ATIVAN) injection 2 mg, 2 mg, Intravenous, Q2H PRN, Haris Baack A, MD .  nitroGLYCERIN (NITROSTAT) SL tablet 0.4 mg, 0.4 mg, Sublingual, Q5 min PRN, Judith Part, MD .  ondansetron (ZOFRAN) tablet 4 mg,  4 mg, Oral, Q4H PRN **OR** ondansetron (ZOFRAN) injection 4 mg, 4 mg, Intravenous, Q4H PRN, Jaycie Kregel A, MD .  polyethylene glycol (MIRALAX / GLYCOLAX) packet 17 g, 17 g, Oral, Daily PRN, Judith Part, MD .  promethazine (PHENERGAN) tablet 12.5-25 mg, 12.5-25 mg, Oral, Q4H PRN, Judith Part, MD .  simvastatin (ZOCOR) tablet 20 mg, 20 mg, Oral, Q M,W,F, Abbi Mancini, Joyice Faster, MD   Physical Exam: Eyes open to voice, PERRL, not FC but MAEx4 to stimulus, localizes bilaterally in upper extremities, gaze neutral, did make one or two moans / sounds during exam but otherwise no verbal output  Assessment & Plan: 86 y.o. woman s/p fall, on plavix  for CAD, CTH w/ bilateral parafalcine acute SDH.  Neuro - SDH, status epilepticus -continue continuous EEG -EEG o/n with BiPLEDs but no clear seizures, severe diffuse encephalopathy -cont keppra 500bid, trend renal function, RFP still pending -discussed w/ family, plan is to try to treat her seizures, but no intubation, DNR - they're aware the AEDs will be sedating. If she becomes too somnolent to protect her airway, will switch to comfort care.  Cardiopulm -SBP<180, clevidipine gtt had to be restarted, unable to take po clonidine, will restart p Dobhoff placement today -respiratory status stable  FENGI -cont IVF given CKD, dobhoff pending -qd RFP -continue home Rx  Heme/ID -hold clopidogrel given SDH, no active issues  Dispo/PPx -SQH for DVT PPx -keep in ICU pending status epilepticus treatment  Judith Part  11/25/19 7:21 AM

## 2019-11-25 NOTE — Progress Notes (Signed)
Pt noted to have seizure like activity with facial/eye twitching with a fixed downward right gaze for approx 30-45 seconds. 2 mg ativan given. Symptoms resolved. Vital signs stable. Called and informed MD Emelda Brothers. Will continue to monitor.

## 2019-11-25 NOTE — Plan of Care (Signed)
  Problem: Nutrition: Goal: Adequate nutrition will be maintained Outcome: Progressing   Problem: Elimination: Goal: Will not experience complications related to urinary retention Outcome: Progressing   Problem: Safety: Goal: Ability to remain free from injury will improve Outcome: Progressing   

## 2019-11-25 NOTE — Progress Notes (Signed)
LTM maintenance completed; no skin breakdown was seen behind A1 and A2. [facility] notified of maintenance.

## 2019-11-25 NOTE — Procedures (Addendum)
Patient Name:[name] OVZ:[id number] Epilepsy Attending:Jodelle Fausto [name] Referring Physician/Provider:Dr. Deatra Ina Duration:11/24/2019 4128 to 11/25/2019 1157  Patient history:86 year old female with bilateral parafalcine subdural hemorrhages. EEG evaluate for seizures.  Level of alertness:lethargic  AEDs during EEG study:LEV, VPA  Technical aspects: This EEG study was done with scalp electrodes positioned according to the 10-20 International system of electrode placement. Electrical activity was acquired at a sampling rate of 500Hz  and reviewed with a high frequency filter of 70Hz  and a low frequency filter of 1Hz . EEG data were recorded continuously and digitally stored.   Description: EEG showed continuous generalized 3 to 6 Hz theta-delta slowing which at times appeared sharply contoured. Intermittent bilaterally independent  periodic lateralized epileptiform discharges were also seen in left and right hemisphere at 1 to 1.5 Hz which at times appeared rhythmic without any clinical signs and are consistent with subclinical seizures..   Event button was pressed on 11/24/2019 at 1609 for unclear reasons. Concomitant EEG sowed periodic lateralized epileptiform discharges at 1.5 Hz in RIGHT hemisphere.  Event button was again pressed on 11/25/2019 at 0919. At the beginning of seizure,  there were no clinical signs followed by right facial/mouth twitching, repetitive eye blinking Concomitant EEG initially showed 5 to 6 Hz theta slowing admixed with sharp waves in left frontotemporal region (max F7/T7) which gradually evolved to involve the right hemisphere. Similar seizure was also noted on 11/25/2019 at 0732.   ABNORMALITY - Seizure, left frontotemporal region - Bilateral independent periodic lateralized epileptiform discharges, left and right hemisphere -Continuousslow, generalized   IMPRESSION: This study showed multiple clinical and subclinical seizures  arising from left frontotemporal region.There is also evidence of independent epileptogenicity in left and right hemisphere likely secondary to underlying structural abnormality. The periodic epileptiform discharges were at times rhythmic without definite evolution and are on ictal-interictal continuum.  Additionally, there is evidence of severe diffuse encephalopathy, non specific to etiology.   [name] [name]

## 2019-11-25 NOTE — Hospital Course (Signed)
Keppra 1000  DNR / DNI  Can't do Vimpat  Depakote  CrCl 27,

## 2019-11-25 NOTE — Consult Note (Addendum)
[facility] Medical Consultation  [name] PFX:[id number] DOB: [birth year] DOA: 11/22/2019 PCP: [name], MD   Requesting physician: Dr. [name]. Date of consultation: November 25, 2019. Reason for consultation: Bradycardia.  Impression/Recommendations Principal Problem:   Bradycardia Active Problems:   Subdural hematoma (HCC)   Protein-calorie malnutrition, severe    1. Sinus bradycardia -by the time I examined the patient patient is back into normal heart rhythm and rate around 80 bpm.  Per nurse patient is bradycardia started half an hour after giving clonidine.  Will discontinue clonidine and I will replace with minoxidil after discussing with cardiology.  Patient is allergic to multiple medications.  We will continue with ARB as needed IV lisinopril for systolic more than 329 and patient also has standing order for starting Cleviprex if systolic more than 924.  Closely monitoring telemetry.  Will check metabolic panel TSH and magnesium and troponin. 2. Subdural hematoma being managed by neurosurgery. 3. Seizures being managed by neurology.  4. History of CAD. 5. Chronic kidney disease stage II. 6. Hypertension presently on ARB holding off clonidine due to bradycardia.  I have started patient on minoxidil after discussing with cardiology. 7. Mild leukocytosis and anemia follow CBC.  I will followup again tomorrow. Please contact me if I can be of assistance in the meanwhile. Thank you for this consultation.  Chief Complaint: Fall.  HPI:  History of CAD, hypertension had a fall and was brought to the ER where patient was found to be confused and was throwing up.  CT head showed subdural hematoma and patient was admitted for further management.  During admission patient also was found to have seizures.  Neurology was consulted.  Patient was loaded on antiepileptics.  Patient was found to have bradycardic episodes.  The last 1 was around half hour before I saw the  patient and was half an hour after patient was given clonidine.  Patient at the time of my exam is encephalopathic.  Review of Systems:  As presented in the history of present illness nothing else significant.  Past Medical History:  Diagnosis Date  . BPPV (benign paroxysmal positional vertigo) 01/08/2016  . Bradycardia    a. Coreg decreased due to HR low 40s on event monitor 07/13/2015 -> further decreased due to HR upper 30s in 01/2016.  . Breast nodule 02/15/2019  . CAD (coronary artery disease)    a. RV infarct 12-Jul-2005 c/b high grade AV block with total occlusion of RCA, unable to treat with PCI. b. Nuc 10/2015: scar but no ischemia.  . Carotid artery disease ([place])   . Chronic anemia   . Chronic lower back pain   . CKD (chronic kidney disease), stage III   . Complication of anesthesia    "almost died when I had my breast reduction"  . Diabetes ([place]) Jul 12, 2000  . Dyslipidemia   . Gait abnormality 09/06/2019  . GERD (gastroesophageal reflux disease)   . Heart murmur   . High cholesterol   . History of hiatal hernia   . HTN (hypertension)   . Hyperthyroidism   . Memory problem 02/15/2019  . Meniere disease    "for years" takes Antivert 3 times per day (01/31/2016)  . Myocardial infarction [facility]) 2005-07-12   "almost died"  . Near syncope   . Osteoporosis   . Presbycusis of both ears 01/29/2016  . Renal artery stenosis (HCC)    a. left renal artery stent 07/13/10. b. s/p repeat left renal stenting 01/2016.  . Stroke ([place])  a. Prior infarcts seen on head CT.  . TMJ pain dysfunction syndrome 08/26/2016  . Type II diabetes mellitus ([place])    "controlled w/diet and exercise only" (01/31/2016)  . Weakness 02/26/2015   Past Surgical History:  Procedure Laterality Date  . ANTERIOR CERVICAL DECOMP/DISCECTOMY FUSION  02/2002   Archie Endo 08/26/2010  . APPENDECTOMY    . BACK SURGERY    . BIOPSY  07/20/2019   Procedure: BIOPSY;  Surgeon: Otis Brace, MD;  Location: [facility];  Service:  Gastroenterology;;  . BLADDER SUSPENSION  x3  . BREAST LUMPECTOMY  11/2007   breast needle-localized lumpectomy/notes 08/12/2012  . CARDIAC CATHETERIZATION  2007   "couldn't get stents in; I almost died'  . CATARACT EXTRACTION W/ INTRAOCULAR LENS  IMPLANT, BILATERAL Bilateral   . CATARACT EXTRACTION W/ INTRAOCULAR LENS  IMPLANT, BILATERAL Bilateral   . CHOLECYSTECTOMY OPEN    . ESOPHAGOGASTRODUODENOSCOPY (EGD) WITH PROPOFOL N/A 07/20/2019   Procedure: ESOPHAGOGASTRODUODENOSCOPY (EGD) WITH PROPOFOL;  Surgeon: Otis Brace, MD;  Location: [facility];  Service: Gastroenterology;  Laterality: N/A;  . FRACTURE SURGERY    . HIP FRACTURE SURGERY Right 2011   "added rod in my leg and a ball"  . KYPHOPLASTY N/A 03/20/2015   Procedure: THORACIC TWELVE KYPHOPLASTY;  Surgeon: Jovita Gamma, MD;  Location: [place] [facility];  Service: Neurosurgery;  Laterality: N/A;  . KYPHOPLASTY N/A 04/23/2015   Procedure: KYPHOPLASTY - LUMBAR ONE;  Surgeon: Jovita Gamma, MD;  Location: [place] [facility] OR[facility];  Service: Neurosurgery;  Laterality: N/A;  L1 Kyphoplasty  . NECK HARDWARE REMOVAL  11/2002   "replaced screw"  . PERIPHERAL VASCULAR CATHETERIZATION Left 12/20/2014   Procedure: Renal Angiography;  Surgeon: Wellington Hampshire, MD;  Location: [place] CV LAB;  Service: Cardiovascular;  Laterality: Left;  . PERIPHERAL VASCULAR CATHETERIZATION N/A 01/31/2016   Procedure: Renal Angiography;  Surgeon: Lorretta Harp, MD;  Location: [place] CV LAB;  Service: Cardiovascular;  Laterality: N/A;  . PERIPHERAL VASCULAR CATHETERIZATION N/A 01/31/2016   Procedure: Abdominal Aortogram;  Surgeon: Lorretta Harp, MD;  Location: [place] CV LAB;  Service: Cardiovascular;  Laterality: N/A;  . PERIPHERAL VASCULAR CATHETERIZATION  01/31/2016   Procedure: Peripheral Vascular Intervention;  Surgeon: Lorretta Harp, MD;  Location: [place] CV LAB;  Service: Cardiovascular;;  . REDUCTION MAMMAPLASTY Bilateral 1985  . renal  arteriogram  12/2014   occluded previous Lt renal stent and patent rt renal artery.  [name] RENAL ARTERY STENT Left 2012  . TONSILLECTOMY    . TUBAL LIGATION    . VAGINAL HYSTERECTOMY  ~ 1995  . WRIST FRACTURE SURGERY Left 2012   Social History:  reports that she has quit smoking. Her smoking use included cigarettes. She has a 30.00 pack-year smoking history. She has never used smokeless tobacco. She reports that she does not drink alcohol and does not use drugs.  Allergies  Allergen Reactions  . Fentanyl Anaphylaxis    Cardiac arrest and coded  . Fish Allergy Anaphylaxis and Rash  . Morphine And Related Anaphylaxis    Cardiac arrest and coded Per Dr. Conley Canal patient has taken percocet without issue  . Singulair [Montelukast] Palpitations and Other (See Comments)    Increased BP and HR  . Actonel [Risedronate Sodium] Rash and Other (See Comments)    weakness  . Dilaudid [Hydromorphone Hcl] Rash and Other (See Comments)    Crying and screaming  . Fosamax [Alendronate Sodium] Rash and Other (See Comments)    Weakness and myalgias  . Hydralazine  Diarrhea and Other (See Comments)    Swelling in stomach  . Lipitor [Atorvastatin] Swelling and Rash    Tongue swelling   . Lisinopril Diarrhea, Rash and Cough  . Septra [Sulfamethoxazole-Trimethoprim] Nausea And Vomiting and Rash  . Tekturna [Aliskiren] Swelling and Rash    Swelling of tongue  . Tricor [Fenofibrate] Rash and Other (See Comments)    Flu symptoms  . Imdur [Isosorbide Dinitrate] Other (See Comments)    Headaches and blindness  . Amlodipine Diarrhea  . Avapro [Irbesartan] Rash  . Cardio Complete [Nutritional Supplements] Rash  . Cardizem [Diltiazem Hcl] Rash  . Ciprofloxacin Rash and Other (See Comments)    Black spot on body  . Codeine Rash  . Cyclobenzaprine Nausea Only  . Forteo [Parathyroid Hormone (Recomb)] Rash  . Inapsine [Droperidol] Rash  . Ivp Dye [Iodinated Diagnostic Agents] Rash  . Shellfish Allergy Rash   . Tussionex Pennkinetic Er [Hydrocod Polst-Cpm Polst Er] Itching and Rash   Family History  Problem Relation Age of Onset  . Diabetes type II Mother   . Heart attack Mother   . Heart disease Mother   . Stroke Mother   . Congestive Heart Failure Father   . Pulmonary embolism Father   . Alzheimer's disease Sister   . Heart disease Sister   . Heart disease Brother   . Healthy Sister   . Healthy Sister   . Stroke Brother   . Dementia Brother        VASCULAR  . Stroke Brother   . Diabetes type II Brother   . Other Brother        KILLED IN PLANE CRASH  . Pulmonary disease Sister   . Heart attack Sister   . Kidney failure Sister   . Diabetes type II Sister   . Diabetes type II Sister   . Kidney failure Sister     Prior to Admission medications   Medication Sig Start Date End Date Taking? Authorizing Provider  acetaminophen (TYLENOL) 500 MG tablet Take 1,000 mg by mouth every 8 (eight) hours as needed for mild pain (for pain.).    Yes [provider]  Ascorbic Acid (VITAMIN C) 500 MG CHEW Chew 1 tablet by mouth daily.   Yes [provider]  aspirin 81 MG tablet Take 1 tablet (81 mg total) by mouth daily. 02/01/16  Yes Dunn, Dayna N, PA-C  azelastine (ASTELIN) 0.1 % nasal spray Place 1-2 sprays into both nostrils daily as needed for rhinitis or allergies.  12/14/15  Yes [provider]  betamethasone dipropionate (DIPROLENE) 0.05 % cream Apply 1 application topically daily as needed (skin spots).  12/10/18  Yes [provider]  cholecalciferol (VITAMIN D) 1000 UNITS tablet Take 2,000 Units by mouth daily.    Yes [provider]  cloNIDine (CATAPRES) 0.1 MG tablet TAKE TWO TABLETS BY MOUTH IN THE MORNING, ONE TABLET AT 4 PM AND TWO TABLETS AT 10 PM DAILY Patient taking differently: Take 0.1-0.2 mg by mouth See admin instructions. Take two tablets by mouth in the morning, one tablet at 4 PM and two tablets at 10 PM daily 04/28/19  Yes Richardson Dopp T, PA-C  clopidogrel (PLAVIX) 75 MG tablet Take 1 tablet (75 mg total) by mouth daily. Please make overdue appt for future refills. [phone]. 2nd attempt. 04/20/18  Yes Belva Crome, MD  denosumab (PROLIA) 60 MG/ML SOLN injection Inject 60 mg into the skin every 6 (six) months. Administer in upper arm, thigh, or  abdomen   Yes [provider]  docusate sodium (COLACE) 100 MG capsule Take 200 mg by mouth at bedtime as needed for mild constipation.    Yes [provider]  ferrous sulfate (FERROUSUL) 325 (65 FE) MG tablet Take 1 tablet (325 mg total) by mouth as directed. Three times weekly with food 08/22/19  Yes [name], MD  fexofenadine (ALLEGRA) 180 MG tablet Take 180 mg by mouth daily.   Yes [provider]  furosemide (LASIX) 20 MG tablet Take 1 tablet (20 mg total) by mouth 3 (three) times a week. Mon, Wed, and Fri only. 02/28/19 11/22/19 Yes Weaver, Scott T, PA-C  meclizine (ANTIVERT) 25 MG tablet Take 12.5 mg by mouth as needed for dizziness.   Yes [provider]  metFORMIN (GLUCOPHAGE-XR) 500 MG 24 hr tablet Take 500 mg by mouth daily with supper.  11/28/18  Yes [provider]  methimazole (TAPAZOLE) 5 MG tablet Take 5 mg by mouth daily. 08/27/15  Yes [provider]  nitroGLYCERIN (NITROSTAT) 0.4 MG SL tablet Place 0.4 mg under the tongue every 5 (five) minutes as needed for chest pain (3 doses MAX).   Yes [provider]  pantoprazole (PROTONIX) 40 MG tablet Take 1 tablet (40 mg total) by mouth daily. 07/20/19  Yes Brahmbhatt, Parag, MD  Polyethyl Glycol-Propyl Glycol (SYSTANE FREE OP) Place 2 drops into both eyes at bedtime as needed (dry eyes).    Yes [provider]  Probiotic Product (PROBIOTIC PO) Take 1 tablet by mouth daily.   Yes [provider]  simvastatin (ZOCOR) 20 MG tablet Take 20 mg by mouth every Monday, Wednesday, and Friday.    Yes [provider]  telmisartan (MICARDIS) 40  MG tablet Take 20 mg by mouth 2 (two) times daily.   Yes [provider]  OneTouch Delica Lancets 00P MISC Use to test blood sugar twice daily. Dx: E11.9 07/08/19   [name], MD  [facility] ULTRA test strip Use to test blood sugar twice daily. Dx: E11.9 07/08/19   [name], MD  PHENAZOPYRIDINE HCL PO Take 99.5 mg by mouth 6 (six) times daily.    [provider]   Physical Exam: Blood pressure (!) 105/43, pulse (!) 56, temperature 98.8 F (37.1 C), temperature source Axillary, resp. rate (!) 21, height 5' (1.524 m), weight 47 kg, SpO2 99 %. Vitals:   11/25/19 2030 11/25/19 2045  BP: (!) 110/42 (!) 105/43  Pulse: 64 (!) 56  Resp: (!) 21 (!) 21  Temp:    SpO2: 98% 99%     General: Moderately built and nourished.  Eyes: Anicteric no pallor.  ENT: No discharge from the ears eyes nose or mouth.  Neck: No mass felt.  No neck rigidity.  Cardiovascular: No rhonchi or crepitations.  Respiratory: S1-S2 heard.  Abdomen: Soft nontender bowel sounds present.  Skin: No edema.  Musculoskeletal: No rash.  Psychiatric: Patient is comatose.  Neurologic: Patient is comatose.  Labs on Admission:  Basic Metabolic Panel: Recent Labs  Lab 11/22/19 1406 11/23/19 0808 11/25/19 1044 11/25/19 1501  NA 135 136 145  --   K 3.8 3.6 3.0*  --   CL 97* 98 107  --   CO2 24 24 22   --   GLUCOSE 201* 239* 125*  --   BUN 26* 17 25*  --   CREATININE 1.04* 0.99 1.09*  --   CALCIUM 10.0 9.8 9.3  --   MG  --   --   --  1.9  PHOS  --   --  2.8 2.9   Liver Function Tests: Recent Labs  Lab 11/22/19 1406 11/25/19 1044  AST 21  --   ALT 14  --   ALKPHOS 57  --   BILITOT 0.5  --   PROT 7.3  --   ALBUMIN 3.8 3.3*   No results for input(s): LIPASE, AMYLASE in the last 168 hours. No results for input(s): AMMONIA in the last 168 hours. CBC: Recent Labs  Lab 11/22/19 1406 11/23/19 0808  WBC 13.0* 15.0*  NEUTROABS 10.8*  --   HGB 11.2* 11.4*  HCT 35.3* 35.2*   MCV 84.4 83.8  PLT 323 296   Cardiac Enzymes: No results for input(s): CKTOTAL, CKMB, CKMBINDEX, TROPONINI in the last 168 hours. BNP: Invalid input(s): POCBNP CBG: Recent Labs  Lab 11/25/19 0717 11/25/19 1115 11/25/19 1620 11/25/19 1941 11/25/19 2126  GLUCAP 166* 112* 158* 165* 162*    Radiological Exams on Admission: Overnight EEG with video  Result Date: 11/24/2019 Lora Havens, MD     11/25/2019  5:25 AM Patient Name: [name] MRN: [id number] Epilepsy Attending: Lora Havens Referring Physician/Provider: Dr. Deatra Ina Duration: 11/23/2019 1157 to 11/24/2019 1157  Patient history: 86 year old female with bilateral parafalcine subdural hemorrhages.  EEG evaluate for seizures.  Level of alertness: lethargic  AEDs during EEG study: None  Technical aspects: This EEG study was done with scalp electrodes positioned according to the 10-20 International system of electrode placement. Electrical activity was acquired at a sampling rate of 500Hz  and reviewed with a high frequency filter of 70Hz  and a low frequency filter of 1Hz . EEG data were recorded continuously and digitally stored.  Description:  EEG showed continuous generalized and lateralized left hemisphere 3 to 6 Hz theta-delta slowing.  Periodic lateralized epileptiform discharges were also seen in left hemisphere at 1 to 1.5 Hz.  Event button was pressed on 11/24/2019 at 833.  Around 829, patient was being examined by the physician but was noted to be unable to answer any questions or follow commands.  Gradually she was noted to have right mouth deviation, chewing movements, repetitive blinking and possible right gaze deviation with subtle bilateral upper extremity twitching (bilateral lower extremity were difficult to visualize on camera) which then evolved into bilateral lower extremity tonic extension.  This was followed by clonic movements of bilateral upper and lower extremity (right more than left).   Concomitant EEG initially showed 5 to 6 Hz theta slowing admixed with sharp waves in left frontotemporal region (max F7/T7) which gradually evolved to involve the right hemisphere.  After the seizure ended patient continued to have periodic lateralized epileptiform discharges at 1.5 to 2 Hz in left hemisphere. Event button was again pressed on 11/24/2019 at 1046. At the beginning of seizure at 1043,  there were no clinical signs. Around 1044, patient was noted to have right facial/mouth twitching,  Concomitant EEG initially showed 5 to 6 Hz theta slowing admixed with sharp waves in left frontotemporal region (max F7/T7) which gradually evolved to involve the right hemisphere.  After the seizure ended patient continued to have periodic lateralized epileptiform discharges at 1.5 to 2 Hz in left hemisphere. Multiple subclinical seizures were seen throughout the study arising from left frontotemporal region, average 1/h, lasting about about 30 seconds.  ABNORMALITY -Seizures, left frontotemporal region -Periodic lateralized epileptiform discharges, left hemisphere -Continuous slow, generalized and lateralized left hemisphere  IMPRESSION: This study showed multiple seizures arising from left frontotemporal region  as described above.  There is also evidence of epileptogenicity as well as cortical dysfunction in left hemisphere likely secondary to underlying structural abnormality.  Additionally, there is evidence of severe diffuse encephalopathy, non specific to etiology. Dr Zada Finders was notified.  Priyanka [name]    EKG: Independently reviewed.  Normal sinus rhythm.  Time spent: 50 minutes.  Cherika Jessie N [place]  If 7PM-7AM, please contact night-coverage [email or web address] Password [facility] 11/25/2019, 9:45 PM

## 2019-11-25 NOTE — Progress Notes (Signed)
PT Cancellation Note  Patient Details Name: [name] MRN: [id number] DOB: 09/06/1931   Cancelled Treatment:    Reason Eval/Treat Not Completed: Fatigue/lethargy limiting ability to participate;Medical issues which prohibited therapy - Pt s/p seizure, attached to EEG presently. RN requests PT hold today, will see next week to determine plan of care moving forward.   [place] Pager [phone]  Office [phone]    [name] D [name] 11/25/2019, 9:35 AM

## 2019-11-25 NOTE — Progress Notes (Signed)
Pt had episode of profound off and on bradycardia lasting less than 10 min around 2100. Lowest rate noticed was in the lower 30's. Stimulating pt seem to have no change in cardiac rate. Atropine placed in room and physician notified.

## 2019-11-26 LAB — GLUCOSE, CAPILLARY
Glucose-Capillary: 182 mg/dL — ABNORMAL HIGH (ref 70–99)
Glucose-Capillary: 225 mg/dL — ABNORMAL HIGH (ref 70–99)
Glucose-Capillary: 140 mg/dL — ABNORMAL HIGH (ref 70–99)
Glucose-Capillary: 198 mg/dL — ABNORMAL HIGH (ref 70–99)
Glucose-Capillary: 208 mg/dL — ABNORMAL HIGH (ref 70–99)

## 2019-11-26 LAB — BASIC METABOLIC PANEL
Anion gap: 11 (ref 5–15)
BUN: 30 mg/dL — ABNORMAL HIGH (ref 8–23)
CO2: 23 mmol/L (ref 22–32)
Calcium: 8.8 mg/dL — ABNORMAL LOW (ref 8.9–10.3)
Chloride: 113 mmol/L — ABNORMAL HIGH (ref 98–111)
Creatinine, Ser: 1.13 mg/dL — ABNORMAL HIGH (ref 0.44–1.00)
GFR calc Af Amer: 51 mL/min — ABNORMAL LOW (ref >60)
GFR calc non Af Amer: 44 mL/min — ABNORMAL LOW (ref >60)
Glucose, Bld: 99 mg/dL (ref 70–99)
Potassium: 3.1 mmol/L — ABNORMAL LOW (ref 3.5–5.1)
Sodium: 147 mmol/L — ABNORMAL HIGH (ref 135–145)

## 2019-11-26 LAB — MAGNESIUM
Magnesium: 1.8 mg/dL (ref 1.7–2.4)
Magnesium: 1.8 mg/dL (ref 1.7–2.4)
Magnesium: 1.8 mg/dL (ref 1.7–2.4)

## 2019-11-26 LAB — RENAL FUNCTION PANEL
Albumin: 2.7 g/dL — ABNORMAL LOW (ref 3.5–5.0)
Anion gap: 12 (ref 5–15)
BUN: 30 mg/dL — ABNORMAL HIGH (ref 8–23)
CO2: 23 mmol/L (ref 22–32)
Calcium: 9.1 mg/dL (ref 8.9–10.3)
Chloride: 114 mmol/L — ABNORMAL HIGH (ref 98–111)
Creatinine, Ser: 1.09 mg/dL — ABNORMAL HIGH (ref 0.44–1.00)
GFR calc Af Amer: 54 mL/min — ABNORMAL LOW (ref >60)
GFR calc non Af Amer: 46 mL/min — ABNORMAL LOW (ref >60)
Glucose, Bld: 200 mg/dL — ABNORMAL HIGH (ref 70–99)
Phosphorus: 2 mg/dL — ABNORMAL LOW (ref 2.5–4.6)
Potassium: 3.4 mmol/L — ABNORMAL LOW (ref 3.5–5.1)
Sodium: 149 mmol/L — ABNORMAL HIGH (ref 135–145)

## 2019-11-26 LAB — VALPROIC ACID LEVEL: Valproic Acid Lvl: 30 ug/mL — ABNORMAL LOW (ref 50.0–100.0)

## 2019-11-26 LAB — TSH: TSH: 0.115 u[IU]/mL — ABNORMAL LOW (ref 0.350–4.500)

## 2019-11-26 LAB — PHOSPHORUS: Phosphorus: 2.4 mg/dL — ABNORMAL LOW (ref 2.5–4.6)

## 2019-11-26 MED ORDER — POTASSIUM CHLORIDE 10 MEQ/100ML IV SOLN
INTRAVENOUS | Status: AC
  Administered 2019-11-26: 10 meq
  Filled 2019-11-26: qty 100

## 2019-11-26 MED ORDER — POTASSIUM CHLORIDE 10 MEQ/100ML IV SOLN
10.0000 meq | INTRAVENOUS | Status: AC
  Administered 2019-11-26 (×2): 10 meq via INTRAVENOUS
  Filled 2019-11-26: qty 100

## 2019-11-26 MED ORDER — ACETAMINOPHEN 650 MG RE SUPP
650.0000 mg | RECTAL | Status: DC | PRN
  Filled 2019-11-26: qty 1

## 2019-11-26 MED ORDER — MINOXIDIL 2.5 MG PO TABS: 5.0000 mg | ORAL_TABLET | Freq: Every day | ORAL | Status: DC

## 2019-11-26 MED ORDER — INSULIN ASPART 100 UNIT/ML ~~LOC~~ SOLN
0.0000 [IU] | SUBCUTANEOUS | Status: DC
  Administered 2019-11-26: 2 [IU] via SUBCUTANEOUS
  Administered 2019-11-26: 1 [IU] via SUBCUTANEOUS
  Administered 2019-11-26: 2 [IU] via SUBCUTANEOUS
  Administered 2019-11-26 – 2019-11-27 (×7): 3 [IU] via SUBCUTANEOUS
  Administered 2019-11-27: 5 [IU] via SUBCUTANEOUS
  Administered 2019-11-28: 2 [IU] via SUBCUTANEOUS
  Administered 2019-11-28: 3 [IU] via SUBCUTANEOUS
  Administered 2019-11-28: 2 [IU] via SUBCUTANEOUS
  Administered 2019-11-28 (×2): 3 [IU] via SUBCUTANEOUS
  Administered 2019-11-29: 2 [IU] via SUBCUTANEOUS
  Administered 2019-11-29: 5 [IU] via SUBCUTANEOUS
  Administered 2019-11-29: 3 [IU] via SUBCUTANEOUS
  Administered 2019-11-29 (×2): 2 [IU] via SUBCUTANEOUS
  Administered 2019-11-29: 3 [IU] via SUBCUTANEOUS
  Administered 2019-11-30: 5 [IU] via SUBCUTANEOUS
  Administered 2019-11-30 (×3): 3 [IU] via SUBCUTANEOUS
  Administered 2019-11-30 – 2019-12-01 (×3): 2 [IU] via SUBCUTANEOUS
  Administered 2019-12-01: 5 [IU] via SUBCUTANEOUS
  Administered 2019-12-01: 3 [IU] via SUBCUTANEOUS
  Administered 2019-12-01: 2 [IU] via SUBCUTANEOUS
  Administered 2019-12-01 (×2): 3 [IU] via SUBCUTANEOUS
  Administered 2019-12-02 (×4): 2 [IU] via SUBCUTANEOUS
  Administered 2019-12-02: 3 [IU] via SUBCUTANEOUS
  Administered 2019-12-02: 5 [IU] via SUBCUTANEOUS
  Administered 2019-12-03: 1 [IU] via SUBCUTANEOUS
  Administered 2019-12-03 (×2): 3 [IU] via SUBCUTANEOUS
  Administered 2019-12-03: 2 [IU] via SUBCUTANEOUS
  Administered 2019-12-03: 3 [IU] via SUBCUTANEOUS
  Administered 2019-12-04: 2 [IU] via SUBCUTANEOUS
  Administered 2019-12-04: 3 [IU] via SUBCUTANEOUS
  Administered 2019-12-04 (×2): 2 [IU] via SUBCUTANEOUS
  Administered 2019-12-04: 1 [IU] via SUBCUTANEOUS
  Administered 2019-12-04: 3 [IU] via SUBCUTANEOUS
  Administered 2019-12-05: 1 [IU] via SUBCUTANEOUS
  Administered 2019-12-05: 2 [IU] via SUBCUTANEOUS
  Administered 2019-12-05: 3 [IU] via SUBCUTANEOUS
  Administered 2019-12-05 (×2): 2 [IU] via SUBCUTANEOUS
  Administered 2019-12-05 (×2): 3 [IU] via SUBCUTANEOUS
  Administered 2019-12-06 – 2019-12-07 (×2): 1 [IU] via SUBCUTANEOUS
  Administered 2019-12-07 (×2): 2 [IU] via SUBCUTANEOUS
  Administered 2019-12-07: 1 [IU] via SUBCUTANEOUS
  Administered 2019-12-08 (×2): 2 [IU] via SUBCUTANEOUS

## 2019-11-26 MED ORDER — MINOXIDIL 2.5 MG PO TABS
5.0000 mg | ORAL_TABLET | Freq: Every day | ORAL | Status: DC
  Administered 2019-11-26 – 2019-12-08 (×11): 5 mg
  Filled 2019-11-26 (×13): qty 2

## 2019-11-26 MED ORDER — POTASSIUM CHLORIDE 10 MEQ/100ML IV SOLN
10.0000 meq | INTRAVENOUS | Status: AC
  Administered 2019-11-26: 10 meq via INTRAVENOUS
  Filled 2019-11-26 (×2): qty 100

## 2019-11-26 MED ORDER — ACETAMINOPHEN 325 MG PO TABS
650.0000 mg | ORAL_TABLET | ORAL | Status: DC | PRN
  Administered 2019-11-28 – 2019-12-07 (×10): 650 mg
  Filled 2019-11-26 (×11): qty 2

## 2019-11-26 MED ORDER — VALSARTAN 160 MG PO TABS
160.0000 mg | ORAL_TABLET | Freq: Every day | ORAL | Status: DC
  Administered 2019-11-26 – 2019-11-29 (×4): 160 mg
  Filled 2019-11-26 (×5): qty 1

## 2019-11-26 NOTE — Progress Notes (Signed)
LTM maint complete - no skin breakdown under: EF0,OF,H2

## 2019-11-26 NOTE — Progress Notes (Signed)
K+, phos, and Na noted in AM labs. Dr. Shelba Flake aware. New orders obtained.

## 2019-11-26 NOTE — Progress Notes (Addendum)
Neurology Progress Note  Patient ID: [name] is a 86 y.o. presenting with a parafalcine subdural hemorrhage in the setting of a fall on antiplatelet agents (Plavix).  Her past medical history is significant for coronary artery disease (2007 right ventricular infarct complicated by high-grade AV block with bradycardia to the 30s), CKD, diabetes, dyslipidemia, hypertension, osteoporosis and past smoking history (30 pack years)  Major interval events:  Started Depakote, EEG markedly improved  Subjective: Minimally verbal   Exam: Vitals:   11/26/19 1245 11/26/19 1300  BP: (!) 151/76 (!) 153/62  Pulse: (!) 106 99  Resp: (!) 27 (!) 25  Temp:    SpO2: 97% 96%   Gen: Chronically ill-appearing and emaciated Psychiatric: Confused, flat affect Sclera: No injection Resp: non-labored breathing, no acute distress Abd: soft, nt  Neuro: MS: Sleepy but follows some commands with the left upper extremity and wiggles bilateral toes CN: Pupils equal round and reactive to light, tracks examiner in all directions, face symmetric Motor: Antigravity with the bilateral lower extremities and with the left arm, trace movement of the right upper extremity with noxious stimulation Sensory: Appears equally responsive to touch in all 4 extremities  Pertinent Labs: Creatinine stable at 1.09, GFR 46, creatinine clearance 27.1  Impression: 86 year old with seizures provoked by significant subdural hemorrhage, now well controlled on Keppra and Depakote  Recommendations: -Continuous EEG monitoring for today, likely will discontinue Sunday if seizures remain well controlled -Continue Depakote at 250 mg 3 times daily  - follow-up level post load level,  -Continue Keppra 1000 mg twice daily -Continue close neuro checks  Lesleigh Noe MD-PhD [facility] [phone]

## 2019-11-26 NOTE — Consult Note (Signed)
[facility] Medical Consultation  [name] KGU:[id number] DOB: 05/26/1931 DOA: 11/22/2019 PCP: [name], MD   Requesting physician:  Dr. [name] Date of consultation: August 13 Reason for consultation: Bradycardia  Impression/Recommendations Principal Problem:   Bradycardia Active Problems:   Subdural hematoma (HCC)   Protein-calorie malnutrition, severe  1. Sinus bradycardia - Improved . Patient is back into normal heart rhythm and rate around 80 bpm.  Per nurse patient's bradycardia started half an hour after giving clonidine.  Will discontinue clonidine and  will replace with minoxidil after discussing with cardiology.  Patient is allergic to multiple medications.  We will continue with ARB as needed,  IV lisinopril for systolic more than 628 and patient also has standing order for starting Cleviprex if systolic more than 315.  Closely monitoring telemetry.  Will check metabolic panel TSH and magnesium and troponin. 2. Subdural hematoma:  being managed by neurosurgery. 3. Seizures:  controlled being managed by neurology.  4. History of CAD. 5. Chronic kidney disease stage II. 6. Hypertension presently on ARB holding off clonidine due to bradycardia.  we have started patient on minoxidil after discussing with cardiology. 7. Mild leukocytosis and anemia follow CBC.  I will followup again tomorrow. Please contact me if I can be of assistance in the meanwhile. Thank you for this consultation.  Chief Complaint: Fall  HPI:  History of CAD, hypertension had a fall and was brought to the ER where patient was found to be confused and was throwing up.  CT head showed subdural hematoma and patient was admitted for further management.  During admission patient also was found to have seizures.  Neurology was consulted.  Patient was loaded on antiepileptics.  Patient was found to have bradycardic episodes.  The last 1 was around half hour before and was half an hour after  patient was given clonidine.  Patient at the time of my exam is encephalopathic.   Review of Systems:  As presented in the history of present illness nothing else significant.  Past Medical History:  Diagnosis Date  . BPPV (benign paroxysmal positional vertigo) 01/08/2016  . Bradycardia    a. Coreg decreased due to HR low 40s on event monitor 07-10-15 -> further decreased due to HR upper 30s in 01/2016.  . Breast nodule 02/15/2019  . CAD (coronary artery disease)    a. RV infarct 2005-07-09 c/b high grade AV block with total occlusion of RCA, unable to treat with PCI. b. Nuc 10/2015: scar but no ischemia.  . Carotid artery disease ([place])   . Chronic anemia   . Chronic lower back pain   . CKD (chronic kidney disease), stage III   . Complication of anesthesia    "almost died when I had my breast reduction"  . Diabetes ([place]) 2000/07/09  . Dyslipidemia   . Gait abnormality 09/06/2019  . GERD (gastroesophageal reflux disease)   . Heart murmur   . High cholesterol   . History of hiatal hernia   . HTN (hypertension)   . Hyperthyroidism   . Memory problem 02/15/2019  . Meniere disease    "for years" takes Antivert 3 times per day (01/31/2016)  . Myocardial infarction [facility]) 09-Jul-2005   "almost died"  . Near syncope   . Osteoporosis   . Presbycusis of both ears 01/29/2016  . Renal artery stenosis (HCC)    a. left renal artery stent 07/10/10. b. s/p repeat left renal stenting 01/2016.  . Stroke [facility])    a. Prior infarcts  seen on head CT.  . TMJ pain dysfunction syndrome 08/26/2016  . Type II diabetes mellitus ([place])    "controlled w/diet and exercise only" (01/31/2016)  . Weakness 02/26/2015   Past Surgical History:  Procedure Laterality Date  . ANTERIOR CERVICAL DECOMP/DISCECTOMY FUSION  02/2002   Archie Endo 08/26/2010  . APPENDECTOMY    . BACK SURGERY    . BIOPSY  07/20/2019   Procedure: BIOPSY;  Surgeon: Otis Brace, MD;  Location: [facility];  Service: Gastroenterology;;  . BLADDER SUSPENSION  x3  .  BREAST LUMPECTOMY  11/2007   breast needle-localized lumpectomy/notes 08/12/2012  . CARDIAC CATHETERIZATION  2007   "couldn't get stents in; I almost died'  . CATARACT EXTRACTION W/ INTRAOCULAR LENS  IMPLANT, BILATERAL Bilateral   . CATARACT EXTRACTION W/ INTRAOCULAR LENS  IMPLANT, BILATERAL Bilateral   . CHOLECYSTECTOMY OPEN    . ESOPHAGOGASTRODUODENOSCOPY (EGD) WITH PROPOFOL N/A 07/20/2019   Procedure: ESOPHAGOGASTRODUODENOSCOPY (EGD) WITH PROPOFOL;  Surgeon: Otis Brace, MD;  Location: [facility] ENDOS[facility];  Service: Gastroenterology;  Laterality: N/A;  . FRACTURE SURGERY    . HIP FRACTURE SURGERY Right 2011   "added rod in my leg and a ball"  . KYPHOPLASTY N/A 03/20/2015   Procedure: THORACIC TWELVE KYPHOPLASTY;  Surgeon: Jovita Gamma, MD;  Location: [place] NEURO ORS;  Service: Neurosurgery;  Laterality: N/A;  . KYPHOPLASTY N/A 04/23/2015   Procedure: KYPHOPLASTY - LUMBAR ONE;  Surgeon: Jovita Gamma, MD;  Location: [place] NEURO ORS;  Service: Neurosurgery;  Laterality: N/A;  L1 Kyphoplasty  . NECK HARDWARE REMOVAL  11/2002   "replaced screw"  . PERIPHERAL VASCULAR CATHETERIZATION Left 12/20/2014   Procedure: Renal Angiography;  Surgeon: Wellington Hampshire, MD;  Location: [place] CV LAB;  Service: Cardiovascular;  Laterality: Left;  . PERIPHERAL VASCULAR CATHETERIZATION N/A 01/31/2016   Procedure: Renal Angiography;  Surgeon: Lorretta Harp, MD;  Location: [place] CV LAB;  Service: Cardiovascular;  Laterality: N/A;  . PERIPHERAL VASCULAR CATHETERIZATION N/A 01/31/2016   Procedure: Abdominal Aortogram;  Surgeon: Lorretta Harp, MD;  Location: [place] CV LAB;  Service: Cardiovascular;  Laterality: N/A;  . PERIPHERAL VASCULAR CATHETERIZATION  01/31/2016   Procedure: Peripheral Vascular Intervention;  Surgeon: Lorretta Harp, MD;  Location: [place] CV LAB;  Service: Cardiovascular;;  . REDUCTION MAMMAPLASTY Bilateral 1985  . renal arteriogram  12/2014   occluded previous Lt renal  stent and patent rt renal artery.  [name] RENAL ARTERY STENT Left 2012  . TONSILLECTOMY    . TUBAL LIGATION    . VAGINAL HYSTERECTOMY  ~ 1995  . WRIST FRACTURE SURGERY Left 2012   Social History:  reports that she has quit smoking. Her smoking use included cigarettes. She has a 30.00 pack-year smoking history. She has never used smokeless tobacco. She reports that she does not drink alcohol and does not use drugs.  Allergies  Allergen Reactions  . Fentanyl Anaphylaxis    Cardiac arrest and coded  . Fish Allergy Anaphylaxis and Rash  . Morphine And Related Anaphylaxis    Cardiac arrest and coded Per Dr. Conley Canal patient has taken percocet without issue  . Singulair [Montelukast] Palpitations and Other (See Comments)    Increased BP and HR  . Actonel [Risedronate Sodium] Rash and Other (See Comments)    weakness  . Dilaudid [Hydromorphone Hcl] Rash and Other (See Comments)    Crying and screaming  . Fosamax [Alendronate Sodium] Rash and Other (See Comments)    Weakness and myalgias  . Hydralazine Diarrhea and Other (  See Comments)    Swelling in stomach  . Lipitor [Atorvastatin] Swelling and Rash    Tongue swelling   . Lisinopril Diarrhea, Rash and Cough  . Septra [Sulfamethoxazole-Trimethoprim] Nausea And Vomiting and Rash  . Tekturna [Aliskiren] Swelling and Rash    Swelling of tongue  . Tricor [Fenofibrate] Rash and Other (See Comments)    Flu symptoms  . Imdur [Isosorbide Dinitrate] Other (See Comments)    Headaches and blindness  . Amlodipine Diarrhea  . Avapro [Irbesartan] Rash  . Cardio Complete [Nutritional Supplements] Rash  . Cardizem [Diltiazem Hcl] Rash  . Ciprofloxacin Rash and Other (See Comments)    Black spot on body  . Codeine Rash  . Cyclobenzaprine Nausea Only  . Forteo [Parathyroid Hormone (Recomb)] Rash  . Inapsine [Droperidol] Rash  . Ivp Dye [Iodinated Diagnostic Agents] Rash  . Shellfish Allergy Rash  . Tussionex Pennkinetic Er [Hydrocod Polst-Cpm  Polst Er] Itching and Rash   Family History  Problem Relation Age of Onset  . Diabetes type II Mother   . Heart attack Mother   . Heart disease Mother   . Stroke Mother   . Congestive Heart Failure Father   . Pulmonary embolism Father   . Alzheimer's disease Sister   . Heart disease Sister   . Heart disease Brother   . Healthy Sister   . Healthy Sister   . Stroke Brother   . Dementia Brother        VASCULAR  . Stroke Brother   . Diabetes type II Brother   . Other Brother        KILLED IN PLANE CRASH  . Pulmonary disease Sister   . Heart attack Sister   . Kidney failure Sister   . Diabetes type II Sister   . Diabetes type II Sister   . Kidney failure Sister     Prior to Admission medications   Medication Sig Start Date End Date Taking? Authorizing Provider  acetaminophen (TYLENOL) 500 MG tablet Take 1,000 mg by mouth every 8 (eight) hours as needed for mild pain (for pain.).    Yes [provider]  Ascorbic Acid (VITAMIN C) 500 MG CHEW Chew 1 tablet by mouth daily.   Yes [provider]  aspirin 81 MG tablet Take 1 tablet (81 mg total) by mouth daily. 02/01/16  Yes Dunn, Dayna N, PA-C  azelastine (ASTELIN) 0.1 % nasal spray Place 1-2 sprays into both nostrils daily as needed for rhinitis or allergies.  12/14/15  Yes [provider]  betamethasone dipropionate (DIPROLENE) 0.05 % cream Apply 1 application topically daily as needed (skin spots).  12/10/18  Yes [provider]  cholecalciferol (VITAMIN D) 1000 UNITS tablet Take 2,000 Units by mouth daily.    Yes [provider]  cloNIDine (CATAPRES) 0.1 MG tablet TAKE TWO TABLETS BY MOUTH IN THE MORNING, ONE TABLET AT 4 PM AND TWO TABLETS AT 10 PM DAILY Patient taking differently: Take 0.1-0.2 mg by mouth See admin instructions. Take two tablets by mouth in the morning, one tablet at 4 PM and two tablets at 10 PM daily 04/28/19  Yes Richardson Dopp T, PA-C  clopidogrel (PLAVIX) 75 MG tablet  Take 1 tablet (75 mg total) by mouth daily. Please make overdue appt for future refills. [phone]. 2nd attempt. 04/20/18  Yes Belva Crome, MD  denosumab (PROLIA) 60 MG/ML SOLN injection Inject 60 mg into the skin every 6 (six) months. Administer in upper arm, thigh, or abdomen  Yes [provider]  docusate sodium (COLACE) 100 MG capsule Take 200 mg by mouth at bedtime as needed for mild constipation.    Yes [provider]  ferrous sulfate (FERROUSUL) 325 (65 FE) MG tablet Take 1 tablet (325 mg total) by mouth as directed. Three times weekly with food 08/22/19  Yes [name], MD  fexofenadine (ALLEGRA) 180 MG tablet Take 180 mg by mouth daily.   Yes [provider]  furosemide (LASIX) 20 MG tablet Take 1 tablet (20 mg total) by mouth 3 (three) times a week. Mon, Wed, and Fri only. 02/28/19 11/22/19 Yes Weaver, Scott T, PA-C  meclizine (ANTIVERT) 25 MG tablet Take 12.5 mg by mouth as needed for dizziness.   Yes [provider]  metFORMIN (GLUCOPHAGE-XR) 500 MG 24 hr tablet Take 500 mg by mouth daily with supper.  11/28/18  Yes [provider]  methimazole (TAPAZOLE) 5 MG tablet Take 5 mg by mouth daily. 08/27/15  Yes [provider]  nitroGLYCERIN (NITROSTAT) 0.4 MG SL tablet Place 0.4 mg under the tongue every 5 (five) minutes as needed for chest pain (3 doses MAX).   Yes [provider]  pantoprazole (PROTONIX) 40 MG tablet Take 1 tablet (40 mg total) by mouth daily. 07/20/19  Yes Brahmbhatt, Parag, MD  Polyethyl Glycol-Propyl Glycol (SYSTANE FREE OP) Place 2 drops into both eyes at bedtime as needed (dry eyes).    Yes [provider]  Probiotic Product (PROBIOTIC PO) Take 1 tablet by mouth daily.   Yes [provider]  simvastatin (ZOCOR) 20 MG tablet Take 20 mg by mouth every Monday, Wednesday, and Friday.    Yes [provider]  telmisartan (MICARDIS) 40 MG tablet Take 20 mg by mouth 2 (two) times  daily.   Yes [provider]  OneTouch Delica Lancets 67H MISC Use to test blood sugar twice daily. Dx: E11.9 07/08/19   [name], MD  [facility] ULTRA test strip Use to test blood sugar twice daily. Dx: E11.9 07/08/19   [name], MD  PHENAZOPYRIDINE HCL PO Take 99.5 mg by mouth 6 (six) times daily.    [provider]   Physical Exam: Blood pressure (!) 153/62, pulse 99, temperature 99 F (37.2 C), temperature source Axillary, resp. rate (!) 25, height 5' (1.524 m), weight 47 kg, SpO2 96 %. Vitals:   11/26/19 1245 11/26/19 1300  BP: (!) 151/76 (!) 153/62  Pulse: (!) 106 99  Resp: (!) 27 (!) 25  Temp:    SpO2: 97% 96%    General: Moderately built and nourished.  Eyes: Anicteric no pallor.  ENT: No discharge from the ears eyes nose or mouth.  Neck: No mass felt.  No neck rigidity.  Cardiovascular: No rhonchi or crepitations.  Respiratory: S1-S2 heard.  Abdomen: Soft nontender bowel sounds present.  Skin: No edema.  Musculoskeletal: No rash.  Psychiatric: Patient is comatose  Neurologic: Patient is comatose   Labs on Admission:  Basic Metabolic Panel: Recent Labs  Lab 11/22/19 1406 11/23/19 0808 11/25/19 1044 11/25/19 1501 11/25/19 2325 11/26/19 0621  NA 135 136 145  --  147* 149*  K 3.8 3.6 3.0*  --  3.1* 3.4*  CL 97* 98 107  --  113* 114*  CO2 24 24 22   --  23 23  GLUCOSE 201* 239* 125*  --  99 200*  BUN 26* 17 25*  --  30* 30*  CREATININE 1.04* 0.99 1.09*  --  1.13*  1.09*  CALCIUM 10.0 9.8 9.3  --  8.8* 9.1  MG  --   --   --  1.9 1.8 1.8  PHOS  --   --  2.8 2.9  --  2.0*   Liver Function Tests: Recent Labs  Lab 11/22/19 1406 11/25/19 1044 11/26/19 0621  AST 21  --   --   ALT 14  --   --   ALKPHOS 57  --   --   BILITOT 0.5  --   --   PROT 7.3  --   --   ALBUMIN 3.8 3.3* 2.7*   No results for input(s): LIPASE, AMYLASE in the last 168 hours. No results for input(s): AMMONIA in the last 168 hours. CBC: Recent Labs   Lab 11/22/19 1406 11/23/19 0808  WBC 13.0* 15.0*  NEUTROABS 10.8*  --   HGB 11.2* 11.4*  HCT 35.3* 35.2*  MCV 84.4 83.8  PLT 323 296   Cardiac Enzymes: No results for input(s): CKTOTAL, CKMB, CKMBINDEX, TROPONINI in the last 168 hours. BNP: Invalid input(s): POCBNP CBG: Recent Labs  Lab 11/25/19 2126 11/25/19 2329 11/26/19 0333 11/26/19 0818 11/26/19 1219  GLUCAP 162* 87 182* 225* 140*    Radiological Exams on Admission: No results found.  EKG independently reviewed: Normal sinus rhythm   Time spent: 25 min.  [place] [facility]   If 7PM-7AM, please contact night-coverage [email or web address]  11/26/2019, 2:26 PM

## 2019-11-26 NOTE — Procedures (Signed)
Patient Name:[name] XTA:[id number] Epilepsy Attending:Antavion Bartoszek [name] Referring Physician/Provider:Dr. Deatra Ina Duration:11/25/2019 1157 to 11/26/2019 1157  Patient history:86 year old female with bilateral parafalcine subdural hemorrhages. EEG evaluate for seizures.  Level of alertness:lethargic  AEDs during EEG study:LEV, VPA  Technical aspects: This EEG study was done with scalp electrodes positioned according to the 10-20 International system of electrode placement. Electrical activity was acquired at a sampling rate of 500Hz  and reviewed with a high frequency filter of 70Hz  and a low frequency filter of 1Hz . EEG data were recorded continuously and digitally stored.   Description: EEG showed continuous generalized 3 to 6 Hz theta-delta slowing which at times appeared sharply contoured and rhythmic. Sleep was characterized by vertex waves, sleep spindles (12-14Hz ), maximal frontocentral region.   ABNORMALITY -Continuousslow, generalized   IMPRESSION: This studyis suggestive of severe diffuse encephalopathy, non specific to etiology. No seizures or definite epileptiform discharges were seen.   EEG appears to have improved compared to previous study.   Haddie Bruhl [name]

## 2019-11-26 NOTE — Progress Notes (Signed)
Patient ID: [name], female   DOB: 01/02/1932, 86 y.o.   MRN: [id number] Overall not much change however patient does not appear to be actively in status#svs did arouse to voice regarding but did not follow commands does appear to be aphasic moves all extremities to stimulation

## 2019-11-27 LAB — RENAL FUNCTION PANEL
Albumin: 2.7 g/dL — ABNORMAL LOW (ref 3.5–5.0)
Anion gap: 14 (ref 5–15)
BUN: 28 mg/dL — ABNORMAL HIGH (ref 8–23)
CO2: 22 mmol/L (ref 22–32)
Calcium: 9.2 mg/dL (ref 8.9–10.3)
Chloride: 114 mmol/L — ABNORMAL HIGH (ref 98–111)
Creatinine, Ser: 0.96 mg/dL (ref 0.44–1.00)
GFR calc Af Amer: >60 mL/min (ref >60)
GFR calc non Af Amer: 54 mL/min — ABNORMAL LOW (ref >60)
Glucose, Bld: 224 mg/dL — ABNORMAL HIGH (ref 70–99)
Phosphorus: 3.1 mg/dL (ref 2.5–4.6)
Potassium: 3.3 mmol/L — ABNORMAL LOW (ref 3.5–5.1)
Sodium: 150 mmol/L — ABNORMAL HIGH (ref 135–145)

## 2019-11-27 LAB — GLUCOSE, CAPILLARY
Glucose-Capillary: 157 mg/dL — ABNORMAL HIGH (ref 70–99)
Glucose-Capillary: 207 mg/dL — ABNORMAL HIGH (ref 70–99)
Glucose-Capillary: 226 mg/dL — ABNORMAL HIGH (ref 70–99)
Glucose-Capillary: 228 mg/dL — ABNORMAL HIGH (ref 70–99)
Glucose-Capillary: 237 mg/dL — ABNORMAL HIGH (ref 70–99)
Glucose-Capillary: 248 mg/dL — ABNORMAL HIGH (ref 70–99)
Glucose-Capillary: 260 mg/dL — ABNORMAL HIGH (ref 70–99)

## 2019-11-27 LAB — MAGNESIUM: Magnesium: 1.8 mg/dL (ref 1.7–2.4)

## 2019-11-27 MED ORDER — LABETALOL HCL 100 MG PO TABS
300.0000 mg | ORAL_TABLET | Freq: Four times a day (QID) | ORAL | Status: DC
  Administered 2019-11-27 (×2): 300 mg
  Filled 2019-11-27 (×2): qty 3

## 2019-11-27 MED ORDER — POTASSIUM CHLORIDE IN NACL 20-0.45 MEQ/L-% IV SOLN
INTRAVENOUS | Status: DC
  Filled 2019-11-27 (×5): qty 1000

## 2019-11-27 MED ORDER — POLYETHYLENE GLYCOL 3350 17 G PO PACK
17.0000 g | PACK | Freq: Every day | ORAL | Status: DC | PRN
  Administered 2019-11-27: 17 g

## 2019-11-27 MED ORDER — POTASSIUM CHLORIDE 10 MEQ/100ML IV SOLN
10.0000 meq | INTRAVENOUS | Status: AC
  Administered 2019-11-27 (×4): 10 meq via INTRAVENOUS
  Filled 2019-11-27 (×4): qty 100

## 2019-11-27 NOTE — Progress Notes (Signed)
LTM EEG discontinued - no skin breakdown at unhook.   

## 2019-11-27 NOTE — Progress Notes (Signed)
Subjective: Patient reports Patient doing better this morning more awake  Objective: Vital signs in last 24 hours: Temp:  [98.7 F (37.1 C)-100.7 F (38.2 C)] 98.7 F (37.1 C) (08/14 2330) Pulse Rate:  [82-119] 116 (08/15 0730) Resp:  [14-43] 20 (08/15 0730) BP: (141-183)/(47-89) 175/79 (08/15 0730) SpO2:  [95 %-100 %] 98 % (08/15 0730)  Intake/Output from previous day: 08/14 0701 - 08/15 0700 In: 2578.1 [I.V.:1630.7; NG/GT:360; IV Piggyback:587.4] Out: 450 [Urine:450] Intake/Output this shift: No intake/output data recorded.  More awake does follow some simple commands moves her legs etc.  Lab Results: No results for input(s): WBC, HGB, HCT, PLT in the last 72 hours. BMET Recent Labs    11/26/19 0621 11/27/19 0616  NA 149* 150*  K 3.4* 3.3*  CL 114* 114*  CO2 23 22  GLUCOSE 200* 224*  BUN 30* 28*  CREATININE 1.09* 0.96  CALCIUM 9.1 9.2    Studies/Results: No results found.  Assessment/Plan: 86 year old with interhemispheric subdural and seizure disorder seizures seem well controlled patient more alert does follow some simple commands will slowly and progressively mobilize with physical occupational and speech therapy  LOS: 5 days     Elaina Hoops 11/27/2019, 8:09 AM

## 2019-11-27 NOTE — Consult Note (Signed)
[facility] Medical Consultation  [name] OMV:[id number] DOB: [birth year] DOA: 11/22/2019 PCP: [name], MD   Requesting physician:  Dr. [name] Date of consultation: August 13 Reason for consultation: Bradycardia  Impression/Recommendations Principal Problem:   Bradycardia Active Problems:   Subdural hematoma (HCC)   Protein-calorie malnutrition, severe  1. Sinus bradycardia - Improved . Patient is back into normal heart rhythm and rate around 80 bpm.  Per nurse patient's bradycardia started half an hour after giving clonidine.  Will discontinue clonidine and  will replace with minoxidil after discussing with cardiology.  Patient is allergic to multiple medications.  We will continue with ARB as needed, lisinopril for systolic more than 628 and patient also has standing order for starting Cleviprex if systolic more than 366.  Closely monitoring telemetry.    Will change to labetalol,  will try to wean Cleviprex 2. Subdural hematoma:  being managed by neurosurgery. 3. Seizures:  Controlled,  being managed by neurology.  4. History of CAD. 5. Chronic kidney disease stage II. 6. Hypertension presently on ARB holding off clonidine due to bradycardia.  we have started patient on minoxidil after discussing with cardiology.     We will add labetalol and try to wean this Cleviprex. 7. Mild leukocytosis and anemia follow CBC.  I will followup again tomorrow. Please contact me if I can be of assistance in the meanwhile. Thank you for this consultation.  Chief Complaint: Fall  HPI:  History of CAD, hypertension had a fall and was brought to the ER where patient was found to be confused and was throwing up.  CT head showed subdural hematoma and patient was admitted for further management.  During admission patient also was found to have seizures.  Neurology was consulted.  Patient was loaded on antiepileptics.  Patient was found to have bradycardic episodes.  The last 1 was  around half hour before and was half an hour after patient was given clonidine.  Patient at the time of my exam is encephalopathic.   Subjective: Patient was seen and examined at bedside.  She is more alert, awake following commands.  She is connected with the EEG.   Review of Systems:  As presented in the history of present illness nothing else significant.  Past Medical History:  Diagnosis Date  . BPPV (benign paroxysmal positional vertigo) 01/08/2016  . Bradycardia    a. Coreg decreased due to HR low 40s on event monitor 28-Jul-2015 -> further decreased due to HR upper 30s in 01/2016.  . Breast nodule 02/15/2019  . CAD (coronary artery disease)    a. RV infarct Jul 27, 2005 c/b high grade AV block with total occlusion of RCA, unable to treat with PCI. b. Nuc 10/2015: scar but no ischemia.  . Carotid artery disease ([place])   . Chronic anemia   . Chronic lower back pain   . CKD (chronic kidney disease), stage III   . Complication of anesthesia    "almost died when I had my breast reduction"  . Diabetes ([place]) 2000/07/27  . Dyslipidemia   . Gait abnormality 09/06/2019  . GERD (gastroesophageal reflux disease)   . Heart murmur   . High cholesterol   . History of hiatal hernia   . HTN (hypertension)   . Hyperthyroidism   . Memory problem 02/15/2019  . Meniere disease    "for years" takes Antivert 3 times per day (01/31/2016)  . Myocardial infarction [facility]) 07-27-2005   "almost died"  . Near syncope   .  Osteoporosis   . Presbycusis of both ears 01/29/2016  . Renal artery stenosis (HCC)    a. left renal artery stent 2012. b. s/p repeat left renal stenting 01/2016.  . Stroke [facility])    a. Prior infarcts seen on head CT.  . TMJ pain dysfunction syndrome 08/26/2016  . Type II diabetes mellitus ([place])    "controlled w/diet and exercise only" (01/31/2016)  . Weakness 02/26/2015   Past Surgical History:  Procedure Laterality Date  . ANTERIOR CERVICAL DECOMP/DISCECTOMY FUSION  02/2002   Archie Endo 08/26/2010  .  APPENDECTOMY    . BACK SURGERY    . BIOPSY  07/20/2019   Procedure: BIOPSY;  Surgeon: Otis Brace, MD;  Location: [facility];  Service: Gastroenterology;;  . BLADDER SUSPENSION  x3  . BREAST LUMPECTOMY  11/2007   breast needle-localized lumpectomy/notes 08/12/2012  . CARDIAC CATHETERIZATION  2007   "couldn't get stents in; I almost died'  . CATARACT EXTRACTION W/ INTRAOCULAR LENS  IMPLANT, BILATERAL Bilateral   . CATARACT EXTRACTION W/ INTRAOCULAR LENS  IMPLANT, BILATERAL Bilateral   . CHOLECYSTECTOMY OPEN    . ESOPHAGOGASTRODUODENOSCOPY (EGD) WITH PROPOFOL N/A 07/20/2019   Procedure: ESOPHAGOGASTRODUODENOSCOPY (EGD) WITH PROPOFOL;  Surgeon: Otis Brace, MD;  Location: [facility];  Service: Gastroenterology;  Laterality: N/A;  . FRACTURE SURGERY    . HIP FRACTURE SURGERY Right 2011   "added rod in my leg and a ball"  . KYPHOPLASTY N/A 03/20/2015   Procedure: THORACIC TWELVE KYPHOPLASTY;  Surgeon: Jovita Gamma, MD;  Location: [place] [facility] OR[facility];  Service: Neurosurgery;  Laterality: N/A;  . KYPHOPLASTY N/A 04/23/2015   Procedure: KYPHOPLASTY - LUMBAR ONE;  Surgeon: Jovita Gamma, MD;  Location: [place] NEURO ORS;  Service: Neurosurgery;  Laterality: N/A;  L1 Kyphoplasty  . NECK HARDWARE REMOVAL  11/2002   "replaced screw"  . PERIPHERAL VASCULAR CATHETERIZATION Left 12/20/2014   Procedure: Renal Angiography;  Surgeon: Wellington Hampshire, MD;  Location: [place] CV LAB;  Service: Cardiovascular;  Laterality: Left;  . PERIPHERAL VASCULAR CATHETERIZATION N/A 01/31/2016   Procedure: Renal Angiography;  Surgeon: Lorretta Harp, MD;  Location: [place] CV LAB;  Service: Cardiovascular;  Laterality: N/A;  . PERIPHERAL VASCULAR CATHETERIZATION N/A 01/31/2016   Procedure: Abdominal Aortogram;  Surgeon: Lorretta Harp, MD;  Location: [place] CV LAB;  Service: Cardiovascular;  Laterality: N/A;  . PERIPHERAL VASCULAR CATHETERIZATION  01/31/2016   Procedure: Peripheral Vascular Intervention;   Surgeon: Lorretta Harp, MD;  Location: [place] CV LAB;  Service: Cardiovascular;;  . REDUCTION MAMMAPLASTY Bilateral 1985  . renal arteriogram  12/2014   occluded previous Lt renal stent and patent rt renal artery.  [name] RENAL ARTERY STENT Left 2012  . TONSILLECTOMY    . TUBAL LIGATION    . VAGINAL HYSTERECTOMY  ~ 1995  . WRIST FRACTURE SURGERY Left 2012   Social History:  reports that she has quit smoking. Her smoking use included cigarettes. She has a 30.00 pack-year smoking history. She has never used smokeless tobacco. She reports that she does not drink alcohol and does not use drugs.  Allergies  Allergen Reactions  . Fentanyl Anaphylaxis    Cardiac arrest and coded  . Fish Allergy Anaphylaxis and Rash  . Morphine And Related Anaphylaxis    Cardiac arrest and coded Per Dr. Conley Canal patient has taken percocet without issue  . Singulair [Montelukast] Palpitations and Other (See Comments)    Increased BP and HR  . Actonel [Risedronate Sodium] Rash and Other (See Comments)  weakness  . Dilaudid [Hydromorphone Hcl] Rash and Other (See Comments)    Crying and screaming  . Fosamax [Alendronate Sodium] Rash and Other (See Comments)    Weakness and myalgias  . Hydralazine Diarrhea and Other (See Comments)    Swelling in stomach  . Lipitor [Atorvastatin] Swelling and Rash    Tongue swelling   . Lisinopril Diarrhea, Rash and Cough  . Septra [Sulfamethoxazole-Trimethoprim] Nausea And Vomiting and Rash  . Tekturna [Aliskiren] Swelling and Rash    Swelling of tongue  . Tricor [Fenofibrate] Rash and Other (See Comments)    Flu symptoms  . Imdur [Isosorbide Dinitrate] Other (See Comments)    Headaches and blindness  . Amlodipine Diarrhea  . Avapro [Irbesartan] Rash  . Cardio Complete [Nutritional Supplements] Rash  . Cardizem [Diltiazem Hcl] Rash  . Ciprofloxacin Rash and Other (See Comments)    Black spot on body  . Codeine Rash  . Cyclobenzaprine Nausea Only  . Forteo  [Parathyroid Hormone (Recomb)] Rash  . Inapsine [Droperidol] Rash  . Ivp Dye [Iodinated Diagnostic Agents] Rash  . Shellfish Allergy Rash  . Tussionex Pennkinetic Er [Hydrocod Polst-Cpm Polst Er] Itching and Rash   Family History  Problem Relation Age of Onset  . Diabetes type II Mother   . Heart attack Mother   . Heart disease Mother   . Stroke Mother   . Congestive Heart Failure Father   . Pulmonary embolism Father   . Alzheimer's disease Sister   . Heart disease Sister   . Heart disease Brother   . Healthy Sister   . Healthy Sister   . Stroke Brother   . Dementia Brother        VASCULAR  . Stroke Brother   . Diabetes type II Brother   . Other Brother        KILLED IN PLANE CRASH  . Pulmonary disease Sister   . Heart attack Sister   . Kidney failure Sister   . Diabetes type II Sister   . Diabetes type II Sister   . Kidney failure Sister     Prior to Admission medications   Medication Sig Start Date End Date Taking? Authorizing Provider  acetaminophen (TYLENOL) 500 MG tablet Take 1,000 mg by mouth every 8 (eight) hours as needed for mild pain (for pain.).    Yes [provider]  Ascorbic Acid (VITAMIN C) 500 MG CHEW Chew 1 tablet by mouth daily.   Yes [provider]  aspirin 81 MG tablet Take 1 tablet (81 mg total) by mouth daily. 02/01/16  Yes Dunn, Dayna N, PA-C  azelastine (ASTELIN) 0.1 % nasal spray Place 1-2 sprays into both nostrils daily as needed for rhinitis or allergies.  12/14/15  Yes [provider]  betamethasone dipropionate (DIPROLENE) 0.05 % cream Apply 1 application topically daily as needed (skin spots).  12/10/18  Yes [provider]  cholecalciferol (VITAMIN D) 1000 UNITS tablet Take 2,000 Units by mouth daily.    Yes [provider]  cloNIDine (CATAPRES) 0.1 MG tablet TAKE TWO TABLETS BY MOUTH IN THE MORNING, ONE TABLET AT 4 PM AND TWO TABLETS AT 10 PM DAILY Patient taking differently: Take 0.1-0.2 mg by mouth  See admin instructions. Take two tablets by mouth in the morning, one tablet at 4 PM and two tablets at 10 PM daily 04/28/19  Yes Richardson Dopp T, PA-C  clopidogrel (PLAVIX) 75 MG tablet Take 1 tablet (75 mg total) by mouth daily. Please make overdue appt  for future refills. [phone]. 2nd attempt. 04/20/18  Yes Belva Crome, MD  denosumab (PROLIA) 60 MG/ML SOLN injection Inject 60 mg into the skin every 6 (six) months. Administer in upper arm, thigh, or abdomen   Yes [provider]  docusate sodium (COLACE) 100 MG capsule Take 200 mg by mouth at bedtime as needed for mild constipation.    Yes [provider]  ferrous sulfate (FERROUSUL) 325 (65 FE) MG tablet Take 1 tablet (325 mg total) by mouth as directed. Three times weekly with food 08/22/19  Yes [name], MD  fexofenadine (ALLEGRA) 180 MG tablet Take 180 mg by mouth daily.   Yes [provider]  furosemide (LASIX) 20 MG tablet Take 1 tablet (20 mg total) by mouth 3 (three) times a week. Mon, Wed, and Fri only. 02/28/19 11/22/19 Yes Weaver, Scott T, PA-C  meclizine (ANTIVERT) 25 MG tablet Take 12.5 mg by mouth as needed for dizziness.   Yes [provider]  metFORMIN (GLUCOPHAGE-XR) 500 MG 24 hr tablet Take 500 mg by mouth daily with supper.  11/28/18  Yes [provider]  methimazole (TAPAZOLE) 5 MG tablet Take 5 mg by mouth daily. 08/27/15  Yes [provider]  nitroGLYCERIN (NITROSTAT) 0.4 MG SL tablet Place 0.4 mg under the tongue every 5 (five) minutes as needed for chest pain (3 doses MAX).   Yes [provider]  pantoprazole (PROTONIX) 40 MG tablet Take 1 tablet (40 mg total) by mouth daily. 07/20/19  Yes Brahmbhatt, Parag, MD  Polyethyl Glycol-Propyl Glycol (SYSTANE FREE OP) Place 2 drops into both eyes at bedtime as needed (dry eyes).    Yes [provider]  Probiotic Product (PROBIOTIC PO) Take 1 tablet by mouth daily.   Yes [provider]  simvastatin  (ZOCOR) 20 MG tablet Take 20 mg by mouth every Monday, Wednesday, and Friday.    Yes [provider]  telmisartan (MICARDIS) 40 MG tablet Take 20 mg by mouth 2 (two) times daily.   Yes [provider]  OneTouch Delica Lancets 71I MISC Use to test blood sugar twice daily. Dx: E11.9 07/08/19   [name], MD  [facility] ULTRA test strip Use to test blood sugar twice daily. Dx: E11.9 07/08/19   [name], MD  PHENAZOPYRIDINE HCL PO Take 99.5 mg by mouth 6 (six) times daily.    [provider]   Physical Exam: Blood pressure (!) 146/69, pulse (!) 105, temperature 99.7 F (37.6 C), temperature source Axillary, resp. rate (!) 23, height 5' (1.524 m), weight 47 kg, SpO2 97 %. Vitals:   11/27/19 1245 11/27/19 1300  BP: (!) 158/76 (!) 146/69  Pulse: (!) 102 (!) 105  Resp: (!) 23 (!) 23  Temp:    SpO2: 97% 97%    General: Moderately built and nourished.  Eyes: Anicteric no pallor.  ENT: No discharge from the ears eyes nose or mouth.  Neck: No mass felt.  No neck rigidity.  Cardiovascular: No rhonchi or crepitations.  Respiratory: S1-S2 heard.  Abdomen: Soft nontender bowel sounds present.  Skin: No edema.  Musculoskeletal: No rash.  Psychiatric: Patient is comatose  Neurologic: Patient is comatose   Labs on Admission:  Basic Metabolic Panel: Recent Labs  Lab 11/23/19 0808 11/25/19 1044 11/25/19 1501 11/25/19 2325 11/26/19 0621 11/26/19 1655 11/27/19 0616  NA 136 145  --  147* 149*  --  150*  K 3.6 3.0*  --  3.1* 3.4*  --  3.3*  CL 98 107  --  113* 114*  --  114*  CO2 24 22  --  23 23  --  22  GLUCOSE 239* 125*  --  99 200*  --  224*  BUN 17 25*  --  30* 30*  --  28*  CREATININE 0.99 1.09*  --  1.13* 1.09*  --  0.96  CALCIUM 9.8 9.3  --  8.8* 9.1  --  9.2  MG  --   --  1.9 1.8 1.8 1.8 1.8  PHOS  --  2.8 2.9  --  2.0* 2.4* 3.1   Liver Function Tests: Recent Labs  Lab 11/22/19 1406 11/25/19 1044 11/26/19 0621 11/27/19 0616   AST 21  --   --   --   ALT 14  --   --   --   ALKPHOS 57  --   --   --   BILITOT 0.5  --   --   --   PROT 7.3  --   --   --   ALBUMIN 3.8 3.3* 2.7* 2.7*   No results for input(s): LIPASE, AMYLASE in the last 168 hours. No results for input(s): AMMONIA in the last 168 hours. CBC: Recent Labs  Lab 11/22/19 1406 11/23/19 0808  WBC 13.0* 15.0*  NEUTROABS 10.8*  --   HGB 11.2* 11.4*  HCT 35.3* 35.2*  MCV 84.4 83.8  PLT 323 296   Cardiac Enzymes: No results for input(s): CKTOTAL, CKMB, CKMBINDEX, TROPONINI in the last 168 hours. BNP: Invalid input(s): POCBNP CBG: Recent Labs  Lab 11/26/19 2008 11/26/19 2333 11/27/19 0348 11/27/19 0813 11/27/19 1224  GLUCAP 208* 157* 228* 260* 237*    Radiological Exams on Admission: No results found.  EKG independently reviewed: Normal sinus rhythm   Time spent: 25 min.  [place] [facility]   If 7PM-7AM, please contact night-coverage [email or web address]  11/27/2019, 1:47 PM

## 2019-11-27 NOTE — Procedures (Addendum)
Patient Name:[name] KKD:[id number] Epilepsy Attending:Mikelle Myrick [name] Referring Physician/Provider:Dr. Deatra Ina Duration:11/26/2019 1157 to 8/15/20211049  Patient history:86 year old female with bilateral parafalcine subdural hemorrhages. EEG evaluate for seizures.  Level of alertness:lethargic  AEDs during EEG study:LEV, VPA  Technical aspects: This EEG study was done with scalp electrodes positioned according to the 10-20 International system of electrode placement. Electrical activity was acquired at a sampling rate of 500Hz  and reviewed with a high frequency filter of 70Hz  and a low frequency filter of 1Hz . EEG data were recorded continuously and digitally stored.   Description: No posterior dominant rhythm was seen. EEG showed continuous generalized 6-9 Hz theta-alpha activity. Sleep was characterized by vertex waves, sleep spindles (12-14Hz ), maximal frontocentral region.   ABNORMALITY -Continuousslow, generalized   IMPRESSION: This studyis suggestive of moderate diffuse encephalopathy, non specific to etiology. No seizures or definite epileptiform discharges were seen.    [name] [name]

## 2019-11-27 NOTE — Progress Notes (Addendum)
Neurology Progress Note  Patient ID: MAKAYA JUNEAU is a 86 y.o. presenting with a parafalcine subdural hemorrhage in the setting of a fall on antiplatelet agents (Plavix).  Her past medical history is significant for coronary artery disease (2007 right ventricular infarct complicated by high-grade AV block with bradycardia to the 30s), CKD, diabetes, dyslipidemia, hypertension, osteoporosis and past smoking history (30 pack years)  Major interval events:  Started Depakote, EEG markedly improved  Subjective: States a few words now, but does not answer any questions reliably  Exam: Vitals:   11/27/19 0915 11/27/19 0930  BP: (!) 174/76 (!) 166/68  Pulse: (!) 117 (!) 115  Resp: (!) 24 (!) 23  Temp:    SpO2: 96% 98%   Gen: Chronically ill-appearing and emaciated Psychiatric: Confused, flat affect, but does smile sometimes slightly HEENT: EEG wires in place, dry tongue Sclera: No injection Resp: non-labored breathing, no acute distress Abd: soft, nt  Neuro: MS: Alert, closes eyes and opens eyes to commands but does not stick out tongue to command CN: Pupils equal round and reactive to light, tracks examiner in all directions, face symmetric, will not stick out tongue on command today Motor: Antigravity with the bilateral lower extremities and with the bilateral arms Sensory: Appears equally responsive to touch in all 4 extremities  Pertinent Labs: Creatinine improving to 0.96, GFR 54, creatinine clearance 30.8 8/14 14:33 valproate level 30  Impression: 86 year old with seizures provoked by significant subdural hemorrhage, now well controlled on Keppra and Depakote.  While post load level was not in the standard therapeutic range, it appears therapeutic for this patient given her excellent response.  We will not adjust Depakote level at this time, but will follow up stable level to confirm it is remaining in a appropriate range  She continues to be intermittently quite hypertensive  and bradycardic requiring ICU care.  Given her tenuous status she will benefit from continued close neurological checks as well  Recommendations: -Stop EEG -Continue Depakote at 250 mg 3 times daily  - follow-up stable level, pharmacy following as well (5 AM 8/16) -Continue Keppra 1000 mg twice daily -Neuro monitoring per primary team -Blood pressure control and heart rate management by primary team  Lesleigh Noe MD-PhD [facility] [phone]

## 2019-11-28 ENCOUNTER — Telehealth: Payer: Self-pay | Admitting: Interventional Cardiology

## 2019-11-28 ENCOUNTER — Inpatient Hospital Stay (HOSPITAL_COMMUNITY): Payer: PPO

## 2019-11-28 LAB — RENAL FUNCTION PANEL
Albumin: 2.3 g/dL — ABNORMAL LOW (ref 3.5–5.0)
Anion gap: 13 (ref 5–15)
BUN: 39 mg/dL — ABNORMAL HIGH (ref 8–23)
CO2: 19 mmol/L — ABNORMAL LOW (ref 22–32)
Calcium: 8.6 mg/dL — ABNORMAL LOW (ref 8.9–10.3)
Chloride: 113 mmol/L — ABNORMAL HIGH (ref 98–111)
Creatinine, Ser: 1.11 mg/dL — ABNORMAL HIGH (ref 0.44–1.00)
GFR calc Af Amer: 52 mL/min — ABNORMAL LOW (ref >60)
GFR calc non Af Amer: 45 mL/min — ABNORMAL LOW (ref >60)
Glucose, Bld: 250 mg/dL — ABNORMAL HIGH (ref 70–99)
Phosphorus: 4.9 mg/dL — ABNORMAL HIGH (ref 2.5–4.6)
Potassium: 4.7 mmol/L (ref 3.5–5.1)
Sodium: 145 mmol/L (ref 135–145)

## 2019-11-28 LAB — TRIGLYCERIDES: Triglycerides: 87 mg/dL (ref <150)

## 2019-11-28 LAB — GLUCOSE, CAPILLARY
Glucose-Capillary: 199 mg/dL — ABNORMAL HIGH (ref 70–99)
Glucose-Capillary: 235 mg/dL — ABNORMAL HIGH (ref 70–99)
Glucose-Capillary: 163 mg/dL — ABNORMAL HIGH (ref 70–99)
Glucose-Capillary: 232 mg/dL — ABNORMAL HIGH (ref 70–99)
Glucose-Capillary: 213 mg/dL — ABNORMAL HIGH (ref 70–99)
Glucose-Capillary: 218 mg/dL — ABNORMAL HIGH (ref 70–99)

## 2019-11-28 LAB — VALPROIC ACID LEVEL: Valproic Acid Lvl: 28 ug/mL — ABNORMAL LOW (ref 50.0–100.0)

## 2019-11-28 MED ORDER — LABETALOL HCL 100 MG PO TABS
100.0000 mg | ORAL_TABLET | Freq: Four times a day (QID) | ORAL | Status: DC
  Administered 2019-11-28 – 2019-12-05 (×20): 100 mg
  Filled 2019-11-28 (×23): qty 1

## 2019-11-28 MED ORDER — AZELASTINE HCL 0.1 % NA SOLN
1.0000 | Freq: Two times a day (BID) | NASAL | Status: DC
  Administered 2019-11-28 – 2019-12-08 (×21): 1 via NASAL
  Filled 2019-11-28: qty 30

## 2019-11-28 NOTE — Progress Notes (Signed)
Physical Therapy Treatment Patient Details Name: [name] MRN: [id number] DOB: [birth year] Today's Date: 11/28/2019    History of Present Illness 86 yo female admitted to ED on 8/10 for fall, progressive confusion, now nonverbal. CT head demonstrates bilateral parafalacine L>R acute subdural hematomas with brain compression but basal cisterns patent. PMH includes BPPV, CAD s/p MI, CKD III, DM, dyslipidemia, memory changes, osteoporosis.    PT Comments    Patient with limited progress due to lethargy.  Son stated she did not rest overnight.  She did arouse once mobilized, but still not assisting with mobility with limited head and trunk righting at EOB and R UE>L UE weakness.  Continue to feel she is appropriate for SNF level rehab.  PT to follow acutely.   Follow Up Recommendations  SNF;Supervision/Assistance - 24 hour     Equipment Recommendations  None recommended by PT    Recommendations for Other Services       Precautions / Restrictions Precautions Precautions: Fall Precaution Comments: R neglect (difficult to discern due to cognitive status); possible seizures    Mobility  Bed Mobility Overal bed mobility: Needs Assistance Bed Mobility: Supine to Sit;Sit to Supine     Supine to sit: Total assist;+2 for physical assistance Sit to supine: Total assist;+2 for physical assistance   General bed mobility comments: pt lethargic and not assisting much  Transfers Overall transfer level: Needs assistance   Transfers: Sit to/from Stand Sit to Stand: +2 physical assistance;Total assist         General transfer comment: attempting x 2 to stand pt not assisting  Ambulation/Gait                 Stairs             Wheelchair Mobility    Modified Rankin (Stroke Patients Only)       Balance Overall balance assessment: Needs assistance Sitting-balance support: Feet supported Sitting balance-Leahy Scale: Zero Sitting balance - Comments: mod A for  sitting balance leaning back with rounded trunk and head flexion; sat EOB about 10 minutes attempting to get pt to lift head to look at son and to work on cervical PROM and trunk extension Postural control: Posterior lean;Right lateral lean   Standing balance-Leahy Scale: Zero Standing balance comment: total +2                            Cognition Arousal/Alertness: Lethargic Behavior During Therapy: Flat affect Overall Cognitive Status: Impaired/Different from baseline Area of Impairment: Attention;Following commands                   Current Attention Level: Focused   Following Commands: Follows one step commands with increased time;Follows one step commands inconsistently       General Comments: lethargic this session, attempts to lift head to command to look at her son, but limited      Exercises      General Comments General comments (skin integrity, edema, etc.): VSS with activity, but pt lethargic, not safe for OOB this session      Pertinent Vitals/Pain Pain Assessment: Faces Faces Pain Scale: Hurts even more Pain Location: neck with movement, and back Pain Descriptors / Indicators: Grimacing Pain Intervention(s): Monitored during session;Repositioned;Patient requesting pain meds-RN notified    Home Living                      Prior Function  PT Goals (current goals can now be found in the care plan section) Progress towards PT goals: Not progressing toward goals - comment    Frequency    Min 2X/week      PT Plan Current plan remains appropriate    Co-evaluation              AM-PAC PT "6 Clicks" Mobility   Outcome Measure  Help needed turning from your back to your side while in a flat bed without using bedrails?: Total Help needed moving from lying on your back to sitting on the side of a flat bed without using bedrails?: Total Help needed moving to and from a bed to a chair (including a wheelchair)?:  Total Help needed standing up from a chair using your arms (e.g., wheelchair or bedside chair)?: Total Help needed to walk in hospital room?: Total Help needed climbing 3-5 steps with a railing? : Total 6 Click Score: 6    End of Session   Activity Tolerance: Patient limited by fatigue;Patient limited by lethargy Patient left: in bed;with call bell/phone within reach;with nursing/sitter in room;Other (comment)   PT Visit Diagnosis: Muscle weakness (generalized) (M62.81);Other abnormalities of gait and mobility (R26.89);Other symptoms and signs involving the nervous system (R29.898)     Time: 1440-1516 PT Time Calculation (min) (ACUTE ONLY): 36 min  Charges:  $Therapeutic Activity: 23-37 mins                     [name], PT Acute Rehabilitation Services XBWIO:[phone] Office:[phone] 11/28/2019    [name] 11/28/2019, 5:59 PM

## 2019-11-28 NOTE — Telephone Encounter (Signed)
Patient's husband calling stating that since 11/22/2019 the patient has been in [facility] Intensive Care. He states she fell and had blood accumulate on top of her brain. He states she can talk again and walk around but she is having problems with her BP. He would like Dr. Tamala Julian to see her or give advise.

## 2019-11-28 NOTE — Consult Note (Signed)
[facility] Medical Consultation  [name] XKG:[id number] DOB: [birth year] DOA: 11/22/2019 PCP: [name], MD   Requesting physician:  Dr. [name] Date of consultation: August 13 Reason for consultation: Bradycardia  Impression/Recommendations Principal Problem:   Bradycardia Active Problems:   Subdural hematoma (HCC)   Protein-calorie malnutrition, severe  1. Sinus bradycardia - Improved . Patient is back into normal heart rhythm and rate around 80 bpm.  Per nurse patient's bradycardia started half an hour after giving clonidine. clonidine discontinued and replaced with minoxidil after discussing with cardiology.  Patient is allergic to multiple medications.  We will continue with ARB.  Closely monitoring telemetry.  Started labetalol 300 mg TID,  will try to wean Cleviprex.      Agree with Transfer to step down if  BP controlled off cleviprex.  2. Subdural hematoma:  being managed by neurosurgery. 3. Seizures:  Controlled,  being managed by neurology.  4. History of CAD. Stable. 5. Chronic kidney disease stage II.  6. Hypertension: presently on ARB holding off clonidine due to bradycardia.  we have started patient on minoxidil after discussing with cardiology.     We will add labetalol and try to wean this Cleviprex.  7. Mild leukocytosis and anemia follow CBC.  I will followup again tomorrow. Please contact me if I can be of assistance in the meanwhile. Thank you for this consultation.  Chief Complaint: Fall  HPI:  History of CAD, hypertension had a fall and was brought to the ER where patient was found to be confused and was throwing up.  CT head showed subdural hematoma and patient was admitted for further management.  During admission patient also was found to have seizures.  Neurology was consulted.  Patient was loaded on antiepileptics.  Patient was found to have bradycardic episodes.  The last 1 was around half hour before and was half an hour after  patient was given clonidine.  Patient at the time of my exam is encephalopathic.   Subjective: Patient was seen and examined at bedside.   No overnight events, slightly confused but alert, oriented, BP better improved.    Review of Systems:  As presented in the history of present illness nothing else significant.  Past Medical History:  Diagnosis Date  . BPPV (benign paroxysmal positional vertigo) 01/08/2016  . Bradycardia    a. Coreg decreased due to HR low 40s on event monitor 07-21-15 -> further decreased due to HR upper 30s in 01/2016.  . Breast nodule 02/15/2019  . CAD (coronary artery disease)    a. RV infarct 07/20/05 c/b high grade AV block with total occlusion of RCA, unable to treat with PCI. b. Nuc 10/2015: scar but no ischemia.  . Carotid artery disease ([place])   . Chronic anemia   . Chronic lower back pain   . CKD (chronic kidney disease), stage III   . Complication of anesthesia    "almost died when I had my breast reduction"  . Diabetes ([place]) Jul 20, 2000  . Dyslipidemia   . Gait abnormality 09/06/2019  . GERD (gastroesophageal reflux disease)   . Heart murmur   . High cholesterol   . History of hiatal hernia   . HTN (hypertension)   . Hyperthyroidism   . Memory problem 02/15/2019  . Meniere disease    "for years" takes Antivert 3 times per day (01/31/2016)  . Myocardial infarction [facility]) Jul 20, 2005   "almost died"  . Near syncope   . Osteoporosis   . Presbycusis of both  ears 01/29/2016  . Renal artery stenosis (HCC)    a. left renal artery stent 2012. b. s/p repeat left renal stenting 01/2016.  . Stroke [facility])    a. Prior infarcts seen on head CT.  . TMJ pain dysfunction syndrome 08/26/2016  . Type II diabetes mellitus ([place])    "controlled w/diet and exercise only" (01/31/2016)  . Weakness 02/26/2015   Past Surgical History:  Procedure Laterality Date  . ANTERIOR CERVICAL DECOMP/DISCECTOMY FUSION  02/2002   Archie Endo 08/26/2010  . APPENDECTOMY    . BACK SURGERY    . BIOPSY   07/20/2019   Procedure: BIOPSY;  Surgeon: Otis Brace, MD;  Location: [facility];  Service: Gastroenterology;;  . BLADDER SUSPENSION  x3  . BREAST LUMPECTOMY  11/2007   breast needle-localized lumpectomy/notes 08/12/2012  . CARDIAC CATHETERIZATION  2007   "couldn't get stents in; I almost died'  . CATARACT EXTRACTION W/ INTRAOCULAR LENS  IMPLANT, BILATERAL Bilateral   . CATARACT EXTRACTION W/ INTRAOCULAR LENS  IMPLANT, BILATERAL Bilateral   . CHOLECYSTECTOMY OPEN    . ESOPHAGOGASTRODUODENOSCOPY (EGD) WITH PROPOFOL N/A 07/20/2019   Procedure: ESOPHAGOGASTRODUODENOSCOPY (EGD) WITH PROPOFOL;  Surgeon: Otis Brace, MD;  Location: [facility];  Service: Gastroenterology;  Laterality: N/A;  . FRACTURE SURGERY    . HIP FRACTURE SURGERY Right 2011   "added rod in my leg and a ball"  . KYPHOPLASTY N/A 03/20/2015   Procedure: THORACIC TWELVE KYPHOPLASTY;  Surgeon: Jovita Gamma, MD;  Location: [place] NEURO ORS;  Service: Neurosurgery;  Laterality: N/A;  . KYPHOPLASTY N/A 04/23/2015   Procedure: KYPHOPLASTY - LUMBAR ONE;  Surgeon: Jovita Gamma, MD;  Location: [place] NEURO ORS;  Service: Neurosurgery;  Laterality: N/A;  L1 Kyphoplasty  . NECK HARDWARE REMOVAL  11/2002   "replaced screw"  . PERIPHERAL VASCULAR CATHETERIZATION Left 12/20/2014   Procedure: Renal Angiography;  Surgeon: Wellington Hampshire, MD;  Location: [place] CV LAB;  Service: Cardiovascular;  Laterality: Left;  . PERIPHERAL VASCULAR CATHETERIZATION N/A 01/31/2016   Procedure: Renal Angiography;  Surgeon: Lorretta Harp, MD;  Location: [place] CV LAB;  Service: Cardiovascular;  Laterality: N/A;  . PERIPHERAL VASCULAR CATHETERIZATION N/A 01/31/2016   Procedure: Abdominal Aortogram;  Surgeon: Lorretta Harp, MD;  Location: [place] CV LAB;  Service: Cardiovascular;  Laterality: N/A;  . PERIPHERAL VASCULAR CATHETERIZATION  01/31/2016   Procedure: Peripheral Vascular Intervention;  Surgeon: Lorretta Harp, MD;  Location: [place] CV LAB;  Service: Cardiovascular;;  . REDUCTION MAMMAPLASTY Bilateral 1985  . renal arteriogram  12/2014   occluded previous Lt renal stent and patent rt renal artery.  [name] RENAL ARTERY STENT Left 2012  . TONSILLECTOMY    . TUBAL LIGATION    . VAGINAL HYSTERECTOMY  ~ 1995  . WRIST FRACTURE SURGERY Left 2012   Social History:  reports that she has quit smoking. Her smoking use included cigarettes. She has a 30.00 pack-year smoking history. She has never used smokeless tobacco. She reports that she does not drink alcohol and does not use drugs.  Allergies  Allergen Reactions  . Fentanyl Anaphylaxis    Cardiac arrest and coded  . Fish Allergy Anaphylaxis and Rash  . Morphine And Related Anaphylaxis    Cardiac arrest and coded Per Dr. Conley Canal patient has taken percocet without issue  . Singulair [Montelukast] Palpitations and Other (See Comments)    Increased BP and HR  . Actonel [Risedronate Sodium] Rash and Other (See Comments)    weakness  . Dilaudid [Hydromorphone  Hcl] Rash and Other (See Comments)    Crying and screaming  . Fosamax [Alendronate Sodium] Rash and Other (See Comments)    Weakness and myalgias  . Hydralazine Diarrhea and Other (See Comments)    Swelling in stomach  . Lipitor [Atorvastatin] Swelling and Rash    Tongue swelling   . Lisinopril Diarrhea, Rash and Cough  . Septra [Sulfamethoxazole-Trimethoprim] Nausea And Vomiting and Rash  . Tekturna [Aliskiren] Swelling and Rash    Swelling of tongue  . Tricor [Fenofibrate] Rash and Other (See Comments)    Flu symptoms  . Imdur [Isosorbide Dinitrate] Other (See Comments)    Headaches and blindness  . Amlodipine Diarrhea  . Avapro [Irbesartan] Rash  . Cardio Complete [Nutritional Supplements] Rash  . Cardizem [Diltiazem Hcl] Rash  . Ciprofloxacin Rash and Other (See Comments)    Black spot on body  . Codeine Rash  . Cyclobenzaprine Nausea Only  . Forteo [Parathyroid Hormone (Recomb)] Rash  . Inapsine  [Droperidol] Rash  . Ivp Dye [Iodinated Diagnostic Agents] Rash  . Shellfish Allergy Rash  . Tussionex Pennkinetic Er [Hydrocod Polst-Cpm Polst Er] Itching and Rash   Family History  Problem Relation Age of Onset  . Diabetes type II Mother   . Heart attack Mother   . Heart disease Mother   . Stroke Mother   . Congestive Heart Failure Father   . Pulmonary embolism Father   . Alzheimer's disease Sister   . Heart disease Sister   . Heart disease Brother   . Healthy Sister   . Healthy Sister   . Stroke Brother   . Dementia Brother        VASCULAR  . Stroke Brother   . Diabetes type II Brother   . Other Brother        KILLED IN PLANE CRASH  . Pulmonary disease Sister   . Heart attack Sister   . Kidney failure Sister   . Diabetes type II Sister   . Diabetes type II Sister   . Kidney failure Sister     Prior to Admission medications   Medication Sig Start Date End Date Taking? Authorizing Provider  acetaminophen (TYLENOL) 500 MG tablet Take 1,000 mg by mouth every 8 (eight) hours as needed for mild pain (for pain.).    Yes [provider]  Ascorbic Acid (VITAMIN C) 500 MG CHEW Chew 1 tablet by mouth daily.   Yes [provider]  aspirin 81 MG tablet Take 1 tablet (81 mg total) by mouth daily. 02/01/16  Yes Dunn, Dayna N, PA-C  azelastine (ASTELIN) 0.1 % nasal spray Place 1-2 sprays into both nostrils daily as needed for rhinitis or allergies.  12/14/15  Yes [provider]  betamethasone dipropionate (DIPROLENE) 0.05 % cream Apply 1 application topically daily as needed (skin spots).  12/10/18  Yes [provider]  cholecalciferol (VITAMIN D) 1000 UNITS tablet Take 2,000 Units by mouth daily.    Yes [provider]  cloNIDine (CATAPRES) 0.1 MG tablet TAKE TWO TABLETS BY MOUTH IN THE MORNING, ONE TABLET AT 4 PM AND TWO TABLETS AT 10 PM DAILY Patient taking differently: Take 0.1-0.2 mg by mouth See admin instructions. Take two tablets by  mouth in the morning, one tablet at 4 PM and two tablets at 10 PM daily 04/28/19  Yes Richardson Dopp T, PA-C  clopidogrel (PLAVIX) 75 MG tablet Take 1 tablet (75 mg total) by mouth daily. Please make overdue appt for future refills. [phone]. 2nd  attempt. 04/20/18  Yes Belva Crome, MD  denosumab (PROLIA) 60 MG/ML SOLN injection Inject 60 mg into the skin every 6 (six) months. Administer in upper arm, thigh, or abdomen   Yes [provider]  docusate sodium (COLACE) 100 MG capsule Take 200 mg by mouth at bedtime as needed for mild constipation.    Yes [provider]  ferrous sulfate (FERROUSUL) 325 (65 FE) MG tablet Take 1 tablet (325 mg total) by mouth as directed. Three times weekly with food 08/22/19  Yes [name], MD  fexofenadine (ALLEGRA) 180 MG tablet Take 180 mg by mouth daily.   Yes [provider]  furosemide (LASIX) 20 MG tablet Take 1 tablet (20 mg total) by mouth 3 (three) times a week. Mon, Wed, and Fri only. 02/28/19 11/22/19 Yes Weaver, Scott T, PA-C  meclizine (ANTIVERT) 25 MG tablet Take 12.5 mg by mouth as needed for dizziness.   Yes [provider]  metFORMIN (GLUCOPHAGE-XR) 500 MG 24 hr tablet Take 500 mg by mouth daily with supper.  11/28/18  Yes [provider]  methimazole (TAPAZOLE) 5 MG tablet Take 5 mg by mouth daily. 08/27/15  Yes [provider]  nitroGLYCERIN (NITROSTAT) 0.4 MG SL tablet Place 0.4 mg under the tongue every 5 (five) minutes as needed for chest pain (3 doses MAX).   Yes [provider]  pantoprazole (PROTONIX) 40 MG tablet Take 1 tablet (40 mg total) by mouth daily. 07/20/19  Yes Brahmbhatt, Parag, MD  Polyethyl Glycol-Propyl Glycol (SYSTANE FREE OP) Place 2 drops into both eyes at bedtime as needed (dry eyes).    Yes [provider]  Probiotic Product (PROBIOTIC PO) Take 1 tablet by mouth daily.   Yes [provider]  simvastatin (ZOCOR) 20 MG tablet Take 20 mg by mouth  every Monday, Wednesday, and Friday.    Yes [provider]  telmisartan (MICARDIS) 40 MG tablet Take 20 mg by mouth 2 (two) times daily.   Yes [provider]  OneTouch Delica Lancets 25D MISC Use to test blood sugar twice daily. Dx: E11.9 07/08/19   [name], MD  [facility] ULTRA test strip Use to test blood sugar twice daily. Dx: E11.9 07/08/19   [name], MD  PHENAZOPYRIDINE HCL PO Take 99.5 mg by mouth 6 (six) times daily.    [provider]   Physical Exam: Blood pressure (!) 191/80, pulse 96, temperature 98.4 F (36.9 C), temperature source Axillary, resp. rate (!) 21, height 5' (1.524 m), weight 47 kg, SpO2 99 %. Vitals:   11/28/19 0748 11/28/19 0800  BP: (!) 181/70 (!) 191/80  Pulse: 96 96  Resp: 20 (!) 21  Temp: 98.4 F (36.9 C)   SpO2: 99% 99%    General: Moderately built and nourished.  Eyes: Anicteric no pallor.  ENT: No discharge from the ears eyes nose or mouth.  Neck: No mass felt.  No neck rigidity.  Cardiovascular: No rhonchi or crepitations.  Respiratory: S1-S2 heard.  Abdomen: Soft nontender bowel sounds present.  Skin: No edema.  Musculoskeletal: No rash.  Psychiatric: Patient is comatose  Neurologic: Patient is comatose   Labs on Admission:  Basic Metabolic Panel: Recent Labs  Lab 11/25/19 1044 11/25/19 1044 11/25/19 1501 11/25/19 2325 11/26/19 0621 11/26/19 1655 11/27/19 0616 11/28/19 0218  NA 145  --   --  147* 149*  --  150* 145  K 3.0*  --   --  3.1* 3.4*  --  3.3* 4.7  CL 107  --   --  113* 114*  --  114* 113*  CO2 22  --   --  23 23  --  22 19*  GLUCOSE 125*  --   --  99 200*  --  224* 250*  BUN 25*  --   --  30* 30*  --  28* 39*  CREATININE 1.09*  --   --  1.13* 1.09*  --  0.96 1.11*  CALCIUM 9.3  --   --  8.8* 9.1  --  9.2 8.6*  MG  --   --  1.9 1.8 1.8 1.8 1.8  --   PHOS 2.8   < > 2.9  --  2.0* 2.4* 3.1 4.9*   < > = values in this interval not displayed.   Liver Function  Tests: Recent Labs  Lab 11/22/19 1406 11/25/19 1044 11/26/19 0621 11/27/19 0616 11/28/19 0218  AST 21  --   --   --   --   ALT 14  --   --   --   --   ALKPHOS 57  --   --   --   --   BILITOT 0.5  --   --   --   --   PROT 7.3  --   --   --   --   ALBUMIN 3.8 3.3* 2.7* 2.7* 2.3*   No results for input(s): LIPASE, AMYLASE in the last 168 hours. No results for input(s): AMMONIA in the last 168 hours. CBC: Recent Labs  Lab 11/22/19 1406 11/23/19 0808  WBC 13.0* 15.0*  NEUTROABS 10.8*  --   HGB 11.2* 11.4*  HCT 35.3* 35.2*  MCV 84.4 83.8  PLT 323 296   Cardiac Enzymes: No results for input(s): CKTOTAL, CKMB, CKMBINDEX, TROPONINI in the last 168 hours. BNP: Invalid input(s): POCBNP CBG: Recent Labs  Lab 11/27/19 1622 11/27/19 1957 11/27/19 2347 11/28/19 0336 11/28/19 0729  GLUCAP 207* 226* 248* 199* 235*    Radiological Exams on Admission: No results found.  EKG independently reviewed: Normal sinus rhythm   Time spent: 25 min.  [place] [facility]   If 7PM-7AM, please contact night-coverage [email or web address]  11/28/2019, 8:11 AM

## 2019-11-28 NOTE — Telephone Encounter (Signed)
Spoke with the patients husband who states the patient had a fall and now is in the hospital with a brain bleed. He states that the doctors there are struggling to get her BP controlled and the patients husband would really like for Dr. Tamala Julian to weigh in. I let him know that we would have to defer to the in patient cardiology team regarding the patients care while she was hospitalized. The patients spouse states he does not trust anyone but Dr. Tamala Julian. I assured the patients husband that I would make sure Dr. Tamala Julian was aware that [name] is in the hospital at this time.

## 2019-11-28 NOTE — Progress Notes (Signed)
Inpatient Diabetes Program Recommendations  AACE/ADA: New Consensus Statement on Inpatient Glycemic Control (2015)  Target Ranges:  Prepandial:   less than 140 mg/dL      Peak postprandial:   less than 180 mg/dL (1-2 hours)      Critically ill patients:  140 - 180 mg/dL   Lab Results  Component Value Date   GLUCAP 235 (H) 11/28/2019   HGBA1C 7.9 (H) 11/22/2019    Review of Glycemic Control Results for [name], [name] (MRN [id number]) as of 11/28/2019 11:55  Ref. Range 11/27/2019 16:22 11/27/2019 19:57 11/27/2019 23:47 11/28/2019 03:36 11/28/2019 07:29  Glucose-Capillary Latest Ref Range: 70 - 99 mg/dL 207 (H) 226 (H) 248 (H) 199 (H) 235 (H)   Diabetes history:  DM2  Outpatient Diabetes medications:  Metformin 500 mg daily   Current orders for Inpatient glycemic control:  Novolog 0-9 units q4H + Osmolite 1.2 at 31ml/hr  Inpatient Diabetes Program Recommendations:    Novolog 2-3 units Q4H tube feed coverage; stop if feeds at held or discontinued.  Will continue to follow while inpatient.  Thank you, Reche Dixon, RN, BSN Diabetes Coordinator Inpatient Diabetes Program [phone] (team pager from 8a-5p)

## 2019-11-28 NOTE — Progress Notes (Signed)
Spoke with Dr. Dwyane Dee regarding Labetolol dosage. Orders modified. VSS at this time.  Cleviprex remains off. Goal SBP< 180 per Neurosx. MD will also look into home medication of Telmisartan that patient's family states she has been taking. Medication provided by family and given to pharmacy.   Neurosurgery also aware.

## 2019-11-28 NOTE — Progress Notes (Signed)
  Speech Language Pathology Treatment: Dysphagia  Patient Details Name: [name] MRN: [id number] DOB: 08/20/1931 Today's Date: 11/28/2019 Time: 4114-6431 SLP Time Calculation (min) (ACUTE ONLY): 15 min  Assessment / Plan / Recommendation Clinical Impression  Pt is more alert compared to last SLP visit but still not verbalizing and only following a few commands. Oral holding is noted with ice chips and thin liquids. Anterior spillage is observed mostly from her L side without attempts to correct it. Despite Max cues she did not show any evidence of swallowing, and whatever remained in her oral cavity was suctioned back out. Would continue to remain NPO pending further improvements in mentation and swallow initiation.    HPI HPI: 86 yo female admitted with increasing AMS s/p fall. CT showed bil parafalacine L>R acute SDH with brain compression. PMH: BPPV, Bradycardia, CAD, CKD, DM, HLD, ACDF 2003, CVA, meniere disease, memory problems, GERD      SLP Plan  Continue with current plan of care       Recommendations  Diet recommendations: NPO Medication Administration: Via alternative means                Oral Care Recommendations: Oral care QID Follow up Recommendations: Skilled Nursing facility SLP Visit Diagnosis: Dysphagia, unspecified (R13.10) Plan: Continue with current plan of care       GO                Osie Bond., M.A. [facility] Pager [phone] Office [phone]  11/28/2019, 10:49 AM

## 2019-11-28 NOTE — Progress Notes (Signed)
Neurosurgery Service Progress Note  Subjective: No acute events overnight, no report of clinical seizures for nursing or concerning epileptiform movements  Objective: Vitals:   11/28/19 0600 11/28/19 0700 11/28/19 0730 11/28/19 0748  BP: (!) 130/53 (!) 185/83 (!) 128/47 (!) 181/70  Pulse: 73 94 81 96  Resp: 15 20 16 20   Temp:      TempSrc:   Axillary   SpO2: 100% 99% 100% 99%  Weight:      Height:       Temp (24hrs), Avg:99.2 F (37.3 C), Min:97.7 F (36.5 C), Max:100.1 F (37.8 C)  CBC Latest Ref Rng & Units 11/23/2019 11/22/2019 09/19/2019  WBC 4.0 - 10.5 K/uL 15.0(H) 13.0(H) 7.9  Hemoglobin 12.0 - 15.0 g/dL 11.4(L) 11.2(L) 9.4(L)  Hematocrit 36 - 46 % 35.2(L) 35.3(L) 31.4(L)  Platelets 150 - 400 K/uL 296 323 374   BMP Latest Ref Rng & Units 11/28/2019 11/27/2019 11/26/2019  Glucose 70 - 99 mg/dL 250(H) 224(H) 200(H)  BUN 8 - 23 mg/dL 39(H) 28(H) 30(H)  Creatinine 0.44 - 1.00 mg/dL 1.11(H) 0.96 1.09(H)  BUN/Creat Ratio 6 - 22 (calc) - - -  Sodium 135 - 145 mmol/L 145 150(H) 149(H)  Potassium 3.5 - 5.1 mmol/L 4.7 3.3(L) 3.4(L)  Chloride 98 - 111 mmol/L 113(H) 114(H) 114(H)  CO2 22 - 32 mmol/L 19(L) 22 23  Calcium 8.9 - 10.3 mg/dL 8.6(L) 9.2 9.1    Intake/Output Summary (Last 24 hours) at 11/28/2019 0749 Last data filed at 11/28/2019 0700 Gross per 24 hour  Intake 3086.77 ml  Output 850 ml  Net 2236.77 ml    Current Facility-Administered Medications:  .  0.45 % NaCl with KCl 20 mEq / L infusion, , Intravenous, Continuous, Shawna Clamp, MD, Last Rate: 75 mL/hr at 11/28/19 0700, Rate Verify at 11/28/19 0700 .  acetaminophen (TYLENOL) tablet 650 mg, 650 mg, Per Tube, Q4H PRN, 650 mg at 11/28/19 0342 **OR** acetaminophen (TYLENOL) suppository 650 mg, 650 mg, Rectal, Q4H PRN, Meyran, Ocie Cornfield, NP .  chlorhexidine (PERIDEX) 0.12 % solution 15 mL, 15 mL, Mouth Rinse, BID, Joevanni Roddey, Joyice Faster, MD, 15 mL at 11/27/19 2152 .  Chlorhexidine Gluconate Cloth 2 % PADS 6 each, 6  each, Topical, Q0600, Judith Part, MD, 6 each at 11/27/19 1529 .  clevidipine (CLEVIPREX) infusion 0.5 mg/mL, 0-21 mg/hr, Intravenous, Continuous, Shalom Mcguiness, Joyice Faster, MD, Paused at 11/27/19 1836 .  docusate (COLACE) 50 MG/5ML liquid 100 mg, 100 mg, Per Tube, BID, Judith Part, MD, 100 mg at 11/27/19 2149 .  enalaprilat (VASOTEC) injection 0.625 mg, 0.625 mg, Intravenous, Q6H PRN, Judith Part, MD, 0.625 mg at 11/28/19 0749 .  feeding supplement (OSMOLITE 1.2 CAL) liquid 1,000 mL, 1,000 mL, Per Tube, Continuous, Deaun Rocha, Joyice Faster, MD, Last Rate: 40 mL/hr at 11/28/19 0700, Rate Verify at 11/28/19 0700 .  feeding supplement (PROSource TF) liquid 45 mL, 45 mL, Per Tube, BID, Deshane Cotroneo, Joyice Faster, MD, 45 mL at 11/27/19 2150 .  furosemide (LASIX) tablet 20 mg, 20 mg, Per Tube, Once per day on Mon Wed Fri, Slayde Brault, Joyice Faster, MD .  heparin injection 5,000 Units, 5,000 Units, Subcutaneous, Q8H, Judith Part, MD, 5,000 Units at 11/28/19 0527 .  insulin aspart (novoLOG) injection 0-9 Units, 0-9 Units, Subcutaneous, Q4H, Tico Crotteau, Joyice Faster, MD, 2 Units at 11/28/19 0345 .  labetalol (NORMODYNE) tablet 300 mg, 300 mg, Per Tube, Q6H, Shawna Clamp, MD, 300 mg at 11/27/19 2241 .  levETIRAcetam (KEPPRA) IVPB 1000 mg/100 mL premix, 1,000  mg, Intravenous, Q12H, Judith Part, MD, Stopped at 11/27/19 2156 .  LORazepam (ATIVAN) injection 2 mg, 2 mg, Intravenous, Q2H PRN, Judith Part, MD, 2 mg at 11/25/19 0923 .  minoxidil (LONITEN) tablet 5 mg, 5 mg, Per Tube, Daily, Rise Patience, MD, 5 mg at 11/27/19 0919 .  nitroGLYCERIN (NITROSTAT) SL tablet 0.4 mg, 0.4 mg, Sublingual, Q5 min PRN, Judith Part, MD .  ondansetron (ZOFRAN) tablet 4 mg, 4 mg, Oral, Q4H PRN **OR** ondansetron (ZOFRAN) injection 4 mg, 4 mg, Intravenous, Q4H PRN, Jiyah Torpey A, MD .  polyethylene glycol (MIRALAX / GLYCOLAX) packet 17 g, 17 g, Per Tube, Daily PRN, Shawna Clamp, MD, 17 g  at 11/27/19 1636 .  promethazine (PHENERGAN) tablet 12.5-25 mg, 12.5-25 mg, Oral, Q4H PRN, Judith Part, MD .  simvastatin (ZOCOR) tablet 20 mg, 20 mg, Per Tube, Q M,W,F, Halsey Hammen A, MD .  valproate (DEPACON) 250 mg in dextrose 5 % 50 mL IVPB, 250 mg, Intravenous, Q8H, Bhagat, Srishti L, MD, Stopped at 11/28/19 [phone]  valsartan (DIOVAN) 160 MG tablet 160 mg, 160 mg, Per Tube, Daily, Judith Part, MD, 160 mg at 11/27/19 6168   Physical Exam: Eyes open to voice, some verbal output / muttering, PERRL, not FC but MAEx4 to stimulus, localizes bilaterally in upper extremities, gaze neutral, did make one or two moans / sounds during exam but otherwise no verbal output  Assessment & Plan: 86 y.o. woman s/p fall, on plavix for CAD, CTH w/ bilateral parafalcine acute SDH.  -neurologically slowly improving -if BP controlled off clovidipine, can transfer to stepdown this afternoon, pending medicine recs -f/u neurology recs, off EEG, on levetiracetam + valproate -medicine recs -SQH for DVT PPx  Judith Part  11/28/19 7:49 AM

## 2019-11-28 NOTE — Progress Notes (Addendum)
Subjective: No clinical seizures overnight.  Patient's son at bedside.  States patient was able to recognize him and his father earlier today.  Also states that patient was restless at night and not get good sleep therefore has been sleeping little bit more today.  ROS: Unable to obtain due to poor mental status  Examination  Vital signs in last 24 hours: Temp:  [97.7 F (36.5 C)-100.1 F (37.8 C)] 98.4 F (36.9 C) (08/16 0748) Pulse Rate:  [32-120] 92 (08/16 1045) Resp:  [15-38] 22 (08/16 1045) BP: (100-191)/(37-97) 165/90 (08/16 1045) SpO2:  [96 %-100 %] 100 % (08/16 1045)  General: lying in bed, not in apparent distress CVS: pulse-normal rate and rhythm RS: breathing comfortably, coarse bilaterally Extremities: normal, warm  Neuro: MS: Opens eyes to verbal stimuli, follows simple commands, oriented to place and person CN: Cranial nerves II to XII grossly intact Motor: Antigravity strength in all 4 extremities, left more than right  Basic Metabolic Panel: Recent Labs  Lab 11/25/19 1044 11/25/19 1044 11/25/19 1501 11/25/19 2325 11/25/19 2325 11/26/19 0621 11/26/19 1655 11/27/19 0616 11/28/19 0218  NA 145  --   --  147*  --  149*  --  150* 145  K 3.0*  --   --  3.1*  --  3.4*  --  3.3* 4.7  CL 107  --   --  113*  --  114*  --  114* 113*  CO2 22  --   --  23  --  23  --  22 19*  GLUCOSE 125*  --   --  99  --  200*  --  224* 250*  BUN 25*  --   --  30*  --  30*  --  28* 39*  CREATININE 1.09*  --   --  1.13*  --  1.09*  --  0.96 1.11*  CALCIUM 9.3   < >  --  8.8*   < > 9.1  --  9.2 8.6*  MG  --   --  1.9 1.8  --  1.8 1.8 1.8  --   PHOS 2.8   < > 2.9  --   --  2.0* 2.4* 3.1 4.9*   < > = values in this interval not displayed.    CBC: Recent Labs  Lab 11/22/19 1406 11/23/19 0808  WBC 13.0* 15.0*  NEUTROABS 10.8*  --   HGB 11.2* 11.4*  HCT 35.3* 35.2*  MCV 84.4 83.8  PLT 323 296    Coagulation Studies: No results for input(s): LABPROT, INR in the last 72  hours.  Imaging CTH wo contrast: Parafalcine subdural hematoma has largely remained stable as compared to the head CT of 11/22/2019, again measuring up to 2.6 cm in thickness anteriorly. However, there is a new 10 mm lobular extension of extra-axial hemorrhage in the left anterior parafalcine region. Subdural hematoma along the bilateral tentorium and left greater than right anterior cerebral hemispheres is unchanged. As before, there is asymmetric mass effect upon the left cerebral hemisphere with partial effacement of the left lateral ventricle and 4 mm rightward midline shift. Stable background generalized parenchymal atrophy and chronic ischemic changes.  ASSESSMENT AND PLAN: 86 year old with seizures provoked by significant subdural hemorrhage, now well controlled on Keppra and Depakote.    Provoked seizures in setting of subdural hemorrhage Acute encephalopathy, secondary to subdural hemorrhage and seizures (improving) -Patient had acute symptomatic seizures in setting of subdural hemorrhage which are well controlled on Keppra and Depakote  Recommendations -Continue Keppra 1000 mg twice daily and Depakote 500 mg every 8 hours -Valproic acid level 28 which is subtherapeutic however patient continues to be seizure-free.  Therefore will not adjust levels at this point. -If she has any breakthrough seizures, can increase valproic acid -Continue seizure precautions -Please call neurology for any further clinical seizures or patient has worsening mentation. -Management of rest of comorbidities per primary team -We will need follow-up with neurology in 8 to 12 weeks after discharge.  As these are acute symptomatic seizures, patient can likely come off of antiseizure medications in future.  Seizure precautions: Per [facility] statutes, patients with seizures are not allowed to drive until they have been seizure-free for six months and cleared by a physician    Use caution when using  heavy equipment or power tools. Avoid working on ladders or at heights. Take showers instead of baths. Ensure the water temperature is not too high on the home water heater. Do not go swimming alone. Do not lock yourself in a room alone (i.e. bathroom). When caring for infants or small children, sit down when holding, feeding, or changing them to minimize risk of injury to the child in the event you have a seizure. Maintain good sleep hygiene. Avoid alcohol.    If patient has another seizure, call 911 and bring them back to the ED if: A.  The seizure lasts longer than 5 minutes.      B.  The patient doesn't wake shortly after the seizure or has new problems such as difficulty seeing, speaking or moving following the seizure C.  The patient was injured during the seizure D.  The patient has a temperature over 102 F (39C) E.  The patient vomited during the seizure and now is having trouble breathing    During the Seizure   - First, ensure adequate ventilation and place patients on the floor on their left side  Loosen clothing around the neck and ensure the airway is patent. If the patient is clenching the teeth, do not force the mouth open with any object as this can cause severe damage - Remove all items from the surrounding that can be hazardous. The patient may be oblivious to what's happening and may not even know what he or she is doing. If the patient is confused and wandering, either gently guide him/her away and block access to outside areas - Reassure the individual and be comforting - Call 911. In most cases, the seizure ends before EMS arrives. However, there are cases when seizures may last over 3 to 5 minutes. Or the individual may have developed breathing difficulties or severe injuries. If a pregnant patient or a person with diabetes develops a seizure, it is prudent to call an ambulance. - Finally, if the patient does not regain full consciousness, then call EMS. Most patients will  remain confused for about 45 to 90 minutes after a seizure, so you must use judgment in calling for help. - Avoid restraints but make sure the patient is in a bed with padded side rails - Place the individual in a lateral position with the neck slightly flexed; this will help the saliva drain from the mouth and prevent the tongue from falling backward - Remove all nearby furniture and other hazards from the area - Provide verbal assurance as the individual is regaining consciousness - Provide the patient with privacy if possible - Call for help and start treatment as ordered by the caregiver  After the Seizure (Postictal Stage)   After a seizure, most patients experience confusion, fatigue, muscle pain and/or a headache. Thus, one should permit the individual to sleep. For the next few days, reassurance is essential. Being calm and helping reorient the person is also of importance.   Most seizures are painless and end spontaneously. Seizures are not harmful to others but can lead to complications such as stress on the lungs, brain and the heart. Individuals with prior lung problems may develop labored breathing and respiratory distress.   I have spent a total of  35  minutes with the patient reviewing hospital notes,  test results, labs and examining the patient as well as establishing an assessment and plan that was discussed personally with the patient's son at bedside.  > 50% of time was spent in direct patient care.     Zeb Comfort Epilepsy [facility] For questions after 5pm please refer to AMION to reach the Neurologist on call

## 2019-11-29 LAB — RENAL FUNCTION PANEL
Albumin: 2.1 g/dL — ABNORMAL LOW (ref 3.5–5.0)
Anion gap: 10 (ref 5–15)
BUN: 42 mg/dL — ABNORMAL HIGH (ref 8–23)
CO2: 20 mmol/L — ABNORMAL LOW (ref 22–32)
Calcium: 8.8 mg/dL — ABNORMAL LOW (ref 8.9–10.3)
Chloride: 113 mmol/L — ABNORMAL HIGH (ref 98–111)
Creatinine, Ser: 1.09 mg/dL — ABNORMAL HIGH (ref 0.44–1.00)
GFR calc Af Amer: 54 mL/min — ABNORMAL LOW (ref >60)
GFR calc non Af Amer: 46 mL/min — ABNORMAL LOW (ref >60)
Glucose, Bld: 206 mg/dL — ABNORMAL HIGH (ref 70–99)
Phosphorus: 4.4 mg/dL (ref 2.5–4.6)
Potassium: 5.5 mmol/L — ABNORMAL HIGH (ref 3.5–5.1)
Sodium: 143 mmol/L (ref 135–145)

## 2019-11-29 LAB — GLUCOSE, CAPILLARY
Glucose-Capillary: 169 mg/dL — ABNORMAL HIGH (ref 70–99)
Glucose-Capillary: 195 mg/dL — ABNORMAL HIGH (ref 70–99)
Glucose-Capillary: 196 mg/dL — ABNORMAL HIGH (ref 70–99)
Glucose-Capillary: 216 mg/dL — ABNORMAL HIGH (ref 70–99)
Glucose-Capillary: 252 mg/dL — ABNORMAL HIGH (ref 70–99)
Glucose-Capillary: 259 mg/dL — ABNORMAL HIGH (ref 70–99)

## 2019-11-29 LAB — POTASSIUM: Potassium: 5.9 mmol/L — ABNORMAL HIGH (ref 3.5–5.1)

## 2019-11-29 MED ORDER — SODIUM ZIRCONIUM CYCLOSILICATE 10 G PO PACK
10.0000 g | PACK | Freq: Once | ORAL | Status: AC
  Administered 2019-11-29: 10 g
  Filled 2019-11-29: qty 1

## 2019-11-29 MED ORDER — SODIUM CHLORIDE 0.45 % IV SOLN: INTRAVENOUS | Status: DC

## 2019-11-29 NOTE — Progress Notes (Addendum)
Inpatient Diabetes Program Recommendations  AACE/ADA: New Consensus Statement on Inpatient Glycemic Control (2015)  Target Ranges:  Prepandial:   less than 140 mg/dL      Peak postprandial:   less than 180 mg/dL (1-2 hours)      Critically ill patients:  140 - 180 mg/dL   Results for AKEILA, LANA (MRN [id number]) as of 11/29/2019 10:13  Ref. Range 11/27/2019 23:47 11/28/2019 03:36 11/28/2019 07:29 11/28/2019 11:58 11/28/2019 17:04 11/28/2019 19:50  Glucose-Capillary Latest Ref Range: 70 - 99 mg/dL 248 (H)  3 units NOVOLOG  199 (H)  2 units NOVOLOG  235 (H)  3 units NOVOLOG  163 (H)  2 units NOVOLOG  232 (H)  3 units NOVOLOG  213 (H)  3 units NOVOLOG    Results for KEYLE, DOBY (MRN [id number]) as of 11/29/2019 10:13  Ref. Range 11/28/2019 23:24 11/29/2019 03:37 11/29/2019 07:53  Glucose-Capillary Latest Ref Range: 70 - 99 mg/dL 218 (H)  3 units NOVOLOG  169 (H)  2 units NOVOLOG  196 (H)  2 units NOVOLOG     Home DM Meds: Metformin 500 Daily PM  Current Orders: Novolog Sensitive Correction Scale/ SSI (0-9 units) Q4 hours     MD- Note patient getting Osmolite tube feeds 40cc/hr.  Having CBGs >200  Please consider adding Novolog Tube Feed Coverage:  Novolog 3 units Q4 hours   HOLD if tube feeds HELD for any reason    --Will follow patient during hospitalization--  Wyn Quaker RN, MSN, CDE Diabetes Coordinator Inpatient Glycemic Control Team Team Pager: [phone] (8a-5p)

## 2019-11-29 NOTE — Telephone Encounter (Signed)
They should request cardiology consult.

## 2019-11-29 NOTE — Progress Notes (Signed)
Neurosurgery Service Progress Note  Subjective: No acute events overnight, no report of clinical seizures for nursing  Objective: Vitals:   11/29/19 0515 11/29/19 0530 11/29/19 0545 11/29/19 0600  BP: (!) 198/90 (!) 165/60 (!) 169/71 (!) 136/49  Pulse: (!) 110 (!) 101 (!) 105 72  Resp: (!) 28 (!) 24 (!) 26 (!) 22  Temp:      TempSrc:      SpO2: 99% 99% 99% 100%  Weight:      Height:       Temp (24hrs), Avg:99.3 F (37.4 C), Min:98.7 F (37.1 C), Max:99.9 F (37.7 C)  CBC Latest Ref Rng & Units 11/23/2019 11/22/2019 09/19/2019  WBC 4.0 - 10.5 K/uL 15.0(H) 13.0(H) 7.9  Hemoglobin 12.0 - 15.0 g/dL 11.4(L) 11.2(L) 9.4(L)  Hematocrit 36 - 46 % 35.2(L) 35.3(L) 31.4(L)  Platelets 150 - 400 K/uL 296 323 374   BMP Latest Ref Rng & Units 11/29/2019 11/28/2019 11/27/2019  Glucose 70 - 99 mg/dL 206(H) 250(H) 224(H)  BUN 8 - 23 mg/dL 42(H) 39(H) 28(H)  Creatinine 0.44 - 1.00 mg/dL 1.09(H) 1.11(H) 0.96  BUN/Creat Ratio 6 - 22 (calc) - - -  Sodium 135 - 145 mmol/L 143 145 150(H)  Potassium 3.5 - 5.1 mmol/L 5.5(H) 4.7 3.3(L)  Chloride 98 - 111 mmol/L 113(H) 113(H) 114(H)  CO2 22 - 32 mmol/L 20(L) 19(L) 22  Calcium 8.9 - 10.3 mg/dL 8.8(L) 8.6(L) 9.2    Intake/Output Summary (Last 24 hours) at 11/29/2019 0802 Last data filed at 11/29/2019 0600 Gross per 24 hour  Intake 2237.44 ml  Output 1350 ml  Net 887.44 ml    Current Facility-Administered Medications:  .  0.45 % NaCl with KCl 20 mEq / L infusion, , Intravenous, Continuous, Shawna Clamp, MD, Last Rate: 75 mL/hr at 11/29/19 0600, Rate Verify at 11/29/19 0600 .  acetaminophen (TYLENOL) tablet 650 mg, 650 mg, Per Tube, Q4H PRN, 650 mg at 11/28/19 1519 **OR** acetaminophen (TYLENOL) suppository 650 mg, 650 mg, Rectal, Q4H PRN, Meyran, Ocie Cornfield, NP .  azelastine (ASTELIN) 0.1 % nasal spray 1 spray, 1 spray, Each Nare, BID, Shawna Clamp, MD, 1 spray at 11/28/19 2116 .  chlorhexidine (PERIDEX) 0.12 % solution 15 mL, 15 mL, Mouth  Rinse, BID, Carlette Palmatier, Joyice Faster, MD, 15 mL at 11/28/19 2100 .  Chlorhexidine Gluconate Cloth 2 % PADS 6 each, 6 each, Topical, Q0600, Judith Part, MD, 6 each at 11/28/19 1100 .  clevidipine (CLEVIPREX) infusion 0.5 mg/mL, 0-21 mg/hr, Intravenous, Continuous, Amelita Risinger, Joyice Faster, MD, Stopped at 11/28/19 1110 .  docusate (COLACE) 50 MG/5ML liquid 100 mg, 100 mg, Per Tube, BID, Judith Part, MD, 100 mg at 11/28/19 0944 .  enalaprilat (VASOTEC) injection 0.625 mg, 0.625 mg, Intravenous, Q6H PRN, Judith Part, MD, 0.625 mg at 11/28/19 1709 .  feeding supplement (OSMOLITE 1.2 CAL) liquid 1,000 mL, 1,000 mL, Per Tube, Continuous, Mayar Whittier, Joyice Faster, MD, Last Rate: 40 mL/hr at 11/28/19 1800, 1,000 mL at 11/28/19 1800 .  feeding supplement (PROSource TF) liquid 45 mL, 45 mL, Per Tube, BID, Jaylyn Booher, Joyice Faster, MD, 45 mL at 11/28/19 2115 .  furosemide (LASIX) tablet 20 mg, 20 mg, Per Tube, Once per day on Mon Wed Fri, Raeonna Milo, Joyice Faster, MD, 20 mg at 11/28/19 0947 .  heparin injection 5,000 Units, 5,000 Units, Subcutaneous, Q8H, Judith Part, MD, 5,000 Units at 11/29/19 0545 .  insulin aspart (novoLOG) injection 0-9 Units, 0-9 Units, Subcutaneous, Q4H, Zitlali Primm, Joyice Faster, MD, 2 Units at 11/29/19  2060 .  labetalol (NORMODYNE) tablet 100 mg, 100 mg, Per Tube, Q6H, Shawna Clamp, MD, 100 mg at 11/29/19 0738 .  levETIRAcetam (KEPPRA) IVPB 1000 mg/100 mL premix, 1,000 mg, Intravenous, Q12H, Judith Part, MD, Stopped at 11/28/19 2102 .  LORazepam (ATIVAN) injection 2 mg, 2 mg, Intravenous, Q2H PRN, Judith Part, MD, 2 mg at 11/25/19 0923 .  minoxidil (LONITEN) tablet 5 mg, 5 mg, Per Tube, Daily, Rise Patience, MD, 5 mg at 11/28/19 0945 .  nitroGLYCERIN (NITROSTAT) SL tablet 0.4 mg, 0.4 mg, Sublingual, Q5 min PRN, Judith Part, MD .  ondansetron (ZOFRAN) tablet 4 mg, 4 mg, Oral, Q4H PRN **OR** ondansetron (ZOFRAN) injection 4 mg, 4 mg, Intravenous, Q4H PRN,  Eliani Leclere A, MD .  polyethylene glycol (MIRALAX / GLYCOLAX) packet 17 g, 17 g, Per Tube, Daily PRN, Shawna Clamp, MD, 17 g at 11/27/19 1636 .  promethazine (PHENERGAN) tablet 12.5-25 mg, 12.5-25 mg, Oral, Q4H PRN, Judith Part, MD .  simvastatin (ZOCOR) tablet 20 mg, 20 mg, Per Tube, Q M,W,F, Judith Part, MD, 20 mg at 11/28/19 0945 .  valproate (DEPACON) 250 mg in dextrose 5 % 50 mL IVPB, 250 mg, Intravenous, Q8H, Bhagat, Srishti L, MD, Last Rate: 52.5 mL/hr at 11/29/19 0600, Rate Verify at 11/29/19 0600 .  valsartan (DIOVAN) 160 MG tablet 160 mg, 160 mg, Per Tube, Daily, Judith Part, MD, 160 mg at 11/28/19 0945   Physical Exam: Eyes open to voice, some verbal output / muttering, a little bit more interactive today, greater response to painful stimulus, PERRL, not FC but MAEx4 to stimulus, localizes bilaterally in upper extremities  Assessment & Plan: 86 y.o. woman s/p fall, on plavix for CAD, CTH w/ bilateral parafalcine acute SDH.  -continues to improve very slowly  -transfer to stepdown / tele -f/u neurology recs, off EEG, on levetiracetam + valproate -medicine recs, can consider changing IVF to [facility] to try to get her ready for possible discharge planning -SQH for DVT PPx  Judith Part  11/29/19 8:02 AM

## 2019-11-29 NOTE — Consult Note (Signed)
[facility] Medical Consultation  [name] LYY:[id number] DOB: 1932-01-13 DOA: 11/22/2019 PCP: [name], MD   Requesting physician:  Dr. [name] Date of consultation: August 13 Reason for consultation: Bradycardia  Impression/Recommendations Principal Problem:   Bradycardia Active Problems:   Subdural hematoma (HCC)   Protein-calorie malnutrition, severe  1. Sinus bradycardia - Improved . Patient is back into normal heart rhythm and rate around 80 bpm.  Per nurse patient's bradycardia started half an hour after giving clonidine. clonidine discontinued and replaced with minoxidil after discussing with cardiology.  Patient is allergic to multiple medications.  We will continue with ARB.  Closely monitoring telemetry.  Started labetalol 100 mg TID,  Successfully weaned her off from Cleviprex.      Agree with Transfer to step down / Telemetry.  2. Subdural hematoma:  being managed by neurosurgery.  3. Seizures:  Controlled,  being managed by neurology.   4. History of CAD. Stable.  5. Chronic kidney disease stage II. Stable.   6. Hypertension: presently on ARB,  holding off clonidine due to bradycardia.        started patient on minoxidil after discussing with cardiology.      Added Labetalol for Better BP control , Off cleviprex drip.  7. Hyperkalemia: Could be secondary to IV fluids with KCl. Change IV fluids to half normal saline, Lokelma x1 given.  Will hold valsartan. Recheck labs tomorrow morning.  I will followup again tomorrow. Please contact me if I can be of assistance in the meanwhile. Thank you for this consultation.  Chief Complaint: Fall  HPI:  History of CAD, hypertension had a fall and was brought to the ER where patient was found to be confused and was throwing up.  CT head showed subdural hematoma and patient was admitted for further management.  During admission patient also was found to have seizures. Neurology was consulted.  Patient was  loaded on antiepileptics.  Patient was found to have bradycardic episodes.  The last 1 was around half hour before and was half an hour after patient was given clonidine.   HR back to normal, bradycardia resolved.   Subjective: Patient was seen and examined at bedside.   No overnight events, BP better improved.   Patient appears confused / daughter at bed side, all questions answered.   Review of Systems:  As presented in the history of present illness nothing else significant.  Past Medical History:  Diagnosis Date  . BPPV (benign paroxysmal positional vertigo) 01/08/2016  . Bradycardia    a. Coreg decreased due to HR low 40s on event monitor 2017 -> further decreased due to HR upper 30s in 01/2016.  . Breast nodule 02/15/2019  . CAD (coronary artery disease)    a. RV infarct 2007 c/b high grade AV block with total occlusion of RCA, unable to treat with PCI. b. Nuc 10/2015: scar but no ischemia.  . Carotid artery disease ([place])   . Chronic anemia   . Chronic lower back pain   . CKD (chronic kidney disease), stage III   . Complication of anesthesia    "almost died when I had my breast reduction"  . Diabetes ([place]) 2002  . Dyslipidemia   . Gait abnormality 09/06/2019  . GERD (gastroesophageal reflux disease)   . Heart murmur   . High cholesterol   . History of hiatal hernia   . HTN (hypertension)   . Hyperthyroidism   . Memory problem 02/15/2019  . Meniere disease    "  for years" takes Antivert 3 times per day (01/31/2016)  . Myocardial infarction [facility]) 07-22-2005   "almost died"  . Near syncope   . Osteoporosis   . Presbycusis of both ears 01/29/2016  . Renal artery stenosis (HCC)    a. left renal artery stent July 23, 2010. b. s/p repeat left renal stenting 01/2016.  . Stroke [facility])    a. Prior infarcts seen on head CT.  . TMJ pain dysfunction syndrome 08/26/2016  . Type II diabetes mellitus ([place])    "controlled w/diet and exercise only" (01/31/2016)  . Weakness 02/26/2015   Past Surgical  History:  Procedure Laterality Date  . ANTERIOR CERVICAL DECOMP/DISCECTOMY FUSION  02/2002   Archie Endo 08/26/2010  . APPENDECTOMY    . BACK SURGERY    . BIOPSY  07/20/2019   Procedure: BIOPSY;  Surgeon: Otis Brace, MD;  Location: [facility];  Service: Gastroenterology;;  . BLADDER SUSPENSION  x3  . BREAST LUMPECTOMY  11/2007   breast needle-localized lumpectomy/notes 08/12/2012  . CARDIAC CATHETERIZATION  22-Jul-2005   "couldn't get stents in; I almost died'  . CATARACT EXTRACTION W/ INTRAOCULAR LENS  IMPLANT, BILATERAL Bilateral   . CATARACT EXTRACTION W/ INTRAOCULAR LENS  IMPLANT, BILATERAL Bilateral   . CHOLECYSTECTOMY OPEN    . ESOPHAGOGASTRODUODENOSCOPY (EGD) WITH PROPOFOL N/A 07/20/2019   Procedure: ESOPHAGOGASTRODUODENOSCOPY (EGD) WITH PROPOFOL;  Surgeon: Otis Brace, MD;  Location: [facility];  Service: Gastroenterology;  Laterality: N/A;  . FRACTURE SURGERY    . HIP FRACTURE SURGERY Right 07-22-09   "added rod in my leg and a ball"  . KYPHOPLASTY N/A 03/20/2015   Procedure: THORACIC TWELVE KYPHOPLASTY;  Surgeon: Jovita Gamma, MD;  Location: [place] [facility] OR[facility];  Service: Neurosurgery;  Laterality: N/A;  . KYPHOPLASTY N/A 04/23/2015   Procedure: KYPHOPLASTY - LUMBAR ONE;  Surgeon: Jovita Gamma, MD;  Location: [place] [facility] OR[facility];  Service: Neurosurgery;  Laterality: N/A;  L1 Kyphoplasty  . NECK HARDWARE REMOVAL  11/2002   "replaced screw"  . PERIPHERAL VASCULAR CATHETERIZATION Left 12/20/2014   Procedure: Renal Angiography;  Surgeon: Wellington Hampshire, MD;  Location: [place] CV LAB;  Service: Cardiovascular;  Laterality: Left;  . PERIPHERAL VASCULAR CATHETERIZATION N/A 01/31/2016   Procedure: Renal Angiography;  Surgeon: Lorretta Harp, MD;  Location: [place] CV LAB;  Service: Cardiovascular;  Laterality: N/A;  . PERIPHERAL VASCULAR CATHETERIZATION N/A 01/31/2016   Procedure: Abdominal Aortogram;  Surgeon: Lorretta Harp, MD;  Location: [place] CV LAB;  Service: Cardiovascular;   Laterality: N/A;  . PERIPHERAL VASCULAR CATHETERIZATION  01/31/2016   Procedure: Peripheral Vascular Intervention;  Surgeon: Lorretta Harp, MD;  Location: [place] CV LAB;  Service: Cardiovascular;;  . REDUCTION MAMMAPLASTY Bilateral 1985  . renal arteriogram  12/2014   occluded previous Lt renal stent and patent rt renal artery.  [name] RENAL ARTERY STENT Left 07/23/2010  . TONSILLECTOMY    . TUBAL LIGATION    . VAGINAL HYSTERECTOMY  ~ 1995  . WRIST FRACTURE SURGERY Left 07/23/10   Social History:  reports that she has quit smoking. Her smoking use included cigarettes. She has a 30.00 pack-year smoking history. She has never used smokeless tobacco. She reports that she does not drink alcohol and does not use drugs.  Allergies  Allergen Reactions  . Fentanyl Anaphylaxis    Cardiac arrest and coded  . Fish Allergy Anaphylaxis and Rash  . Morphine And Related Anaphylaxis    Cardiac arrest and coded Per Dr. Conley Canal patient has taken percocet without issue  .  Singulair [Montelukast] Palpitations and Other (See Comments)    Increased BP and HR  . Actonel [Risedronate Sodium] Rash and Other (See Comments)    weakness  . Dilaudid [Hydromorphone Hcl] Rash and Other (See Comments)    Crying and screaming  . Fosamax [Alendronate Sodium] Rash and Other (See Comments)    Weakness and myalgias  . Hydralazine Diarrhea and Other (See Comments)    Swelling in stomach  . Lipitor [Atorvastatin] Swelling and Rash    Tongue swelling   . Lisinopril Diarrhea, Rash and Cough  . Septra [Sulfamethoxazole-Trimethoprim] Nausea And Vomiting and Rash  . Tekturna [Aliskiren] Swelling and Rash    Swelling of tongue  . Tricor [Fenofibrate] Rash and Other (See Comments)    Flu symptoms  . Imdur [Isosorbide Dinitrate] Other (See Comments)    Headaches and blindness  . Amlodipine Diarrhea  . Avapro [Irbesartan] Rash  . Cardio Complete [Nutritional Supplements] Rash  . Cardizem [Diltiazem Hcl] Rash  .  Ciprofloxacin Rash and Other (See Comments)    Black spot on body  . Codeine Rash  . Cyclobenzaprine Nausea Only  . Forteo [Parathyroid Hormone (Recomb)] Rash  . Inapsine [Droperidol] Rash  . Ivp Dye [Iodinated Diagnostic Agents] Rash  . Shellfish Allergy Rash  . Tussionex Pennkinetic Er [Hydrocod Polst-Cpm Polst Er] Itching and Rash   Family History  Problem Relation Age of Onset  . Diabetes type II Mother   . Heart attack Mother   . Heart disease Mother   . Stroke Mother   . Congestive Heart Failure Father   . Pulmonary embolism Father   . Alzheimer's disease Sister   . Heart disease Sister   . Heart disease Brother   . Healthy Sister   . Healthy Sister   . Stroke Brother   . Dementia Brother        VASCULAR  . Stroke Brother   . Diabetes type II Brother   . Other Brother        KILLED IN PLANE CRASH  . Pulmonary disease Sister   . Heart attack Sister   . Kidney failure Sister   . Diabetes type II Sister   . Diabetes type II Sister   . Kidney failure Sister     Prior to Admission medications   Medication Sig Start Date End Date Taking? Authorizing Provider  acetaminophen (TYLENOL) 500 MG tablet Take 1,000 mg by mouth every 8 (eight) hours as needed for mild pain (for pain.).    Yes [provider]  Ascorbic Acid (VITAMIN C) 500 MG CHEW Chew 1 tablet by mouth daily.   Yes [provider]  aspirin 81 MG tablet Take 1 tablet (81 mg total) by mouth daily. 02/01/16  Yes Dunn, Dayna N, PA-C  azelastine (ASTELIN) 0.1 % nasal spray Place 1-2 sprays into both nostrils daily as needed for rhinitis or allergies.  12/14/15  Yes [provider]  betamethasone dipropionate (DIPROLENE) 0.05 % cream Apply 1 application topically daily as needed (skin spots).  12/10/18  Yes [provider]  cholecalciferol (VITAMIN D) 1000 UNITS tablet Take 2,000 Units by mouth daily.    Yes [provider]  cloNIDine (CATAPRES) 0.1 MG tablet TAKE TWO TABLETS  BY MOUTH IN THE MORNING, ONE TABLET AT 4 PM AND TWO TABLETS AT 10 PM DAILY Patient taking differently: Take 0.1-0.2 mg by mouth See admin instructions. Take two tablets by mouth in the morning, one tablet at 4 PM and two tablets at 10 PM  daily 04/28/19  Yes Richardson Dopp T, PA-C  clopidogrel (PLAVIX) 75 MG tablet Take 1 tablet (75 mg total) by mouth daily. Please make overdue appt for future refills. [phone]. 2nd attempt. 04/20/18  Yes Belva Crome, MD  denosumab (PROLIA) 60 MG/ML SOLN injection Inject 60 mg into the skin every 6 (six) months. Administer in upper arm, thigh, or abdomen   Yes [provider]  docusate sodium (COLACE) 100 MG capsule Take 200 mg by mouth at bedtime as needed for mild constipation.    Yes [provider]  ferrous sulfate (FERROUSUL) 325 (65 FE) MG tablet Take 1 tablet (325 mg total) by mouth as directed. Three times weekly with food 08/22/19  Yes [name], MD  fexofenadine (ALLEGRA) 180 MG tablet Take 180 mg by mouth daily.   Yes [provider]  furosemide (LASIX) 20 MG tablet Take 1 tablet (20 mg total) by mouth 3 (three) times a week. Mon, Wed, and Fri only. 02/28/19 11/22/19 Yes Weaver, Scott T, PA-C  meclizine (ANTIVERT) 25 MG tablet Take 12.5 mg by mouth as needed for dizziness.   Yes [provider]  metFORMIN (GLUCOPHAGE-XR) 500 MG 24 hr tablet Take 500 mg by mouth daily with supper.  11/28/18  Yes [provider]  methimazole (TAPAZOLE) 5 MG tablet Take 5 mg by mouth daily. 08/27/15  Yes [provider]  nitroGLYCERIN (NITROSTAT) 0.4 MG SL tablet Place 0.4 mg under the tongue every 5 (five) minutes as needed for chest pain (3 doses MAX).   Yes [provider]  pantoprazole (PROTONIX) 40 MG tablet Take 1 tablet (40 mg total) by mouth daily. 07/20/19  Yes Brahmbhatt, Parag, MD  Polyethyl Glycol-Propyl Glycol (SYSTANE FREE OP) Place 2 drops into both eyes at bedtime as needed (dry eyes).    Yes  [provider]  Probiotic Product (PROBIOTIC PO) Take 1 tablet by mouth daily.   Yes [provider]  simvastatin (ZOCOR) 20 MG tablet Take 20 mg by mouth every Monday, Wednesday, and Friday.    Yes [provider]  telmisartan (MICARDIS) 40 MG tablet Take 20 mg by mouth 2 (two) times daily.   Yes [provider]  OneTouch Delica Lancets 79T MISC Use to test blood sugar twice daily. Dx: E11.9 07/08/19   [name], MD  [facility] ULTRA test strip Use to test blood sugar twice daily. Dx: E11.9 07/08/19   [name], MD  PHENAZOPYRIDINE HCL PO Take 99.5 mg by mouth 6 (six) times daily.    [provider]   Physical Exam: Blood pressure (!) 136/49, pulse 72, temperature 99.9 F (37.7 C), temperature source Axillary, resp. rate (!) 22, height 5' (1.524 m), weight 47 kg, SpO2 100 %. Vitals:   11/29/19 0545 11/29/19 0600  BP: (!) 169/71 (!) 136/49  Pulse: (!) 105 72  Resp: (!) 26 (!) 22  Temp:    SpO2: 99% 100%    General: Moderately built and nourished.  Eyes: Anicteric no pallor.  ENT: No discharge from the ears eyes nose or mouth.  Neck: No mass felt.  No neck rigidity.  Cardiovascular: No rhonchi or crepitations.  Respiratory: S1-S2 heard.  Abdomen: Soft nontender bowel sounds present.  Skin: No edema.  Musculoskeletal: No rash.  Psychiatric: Patient is lethargic  Neurologic: Patient is lethargic   Labs on Admission:  Basic Metabolic Panel: Recent Labs  Lab 11/25/19 1044 11/25/19 1501 11/25/19 1501 11/25/19 2325 11/26/19 9030 11/26/19 1655 11/27/19 0616 11/28/19  1610 11/29/19 0609  NA   < >  --   --  147* 149*  --  150* 145 143  K   < >  --   --  3.1* 3.4*  --  3.3* 4.7 5.5*  CL   < >  --   --  113* 114*  --  114* 113* 113*  CO2   < >  --   --  23 23  --  22 19* 20*  GLUCOSE   < >  --   --  99 200*  --  224* 250* 206*  BUN   < >  --   --  30* 30*  --  28* 39* 42*  CREATININE   < >  --   --  1.13* 1.09*   --  0.96 1.11* 1.09*  CALCIUM   < >  --   --  8.8* 9.1  --  9.2 8.6* 8.8*  MG  --  1.9  --  1.8 1.8 1.8 1.8  --   --   PHOS  --  2.9   < >  --  2.0* 2.4* 3.1 4.9* 4.4   < > = values in this interval not displayed.   Liver Function Tests: Recent Labs  Lab 11/22/19 1406 11/22/19 1406 11/25/19 1044 11/26/19 0621 11/27/19 0616 11/28/19 0218 11/29/19 0609  AST 21  --   --   --   --   --   --   ALT 14  --   --   --   --   --   --   ALKPHOS 57  --   --   --   --   --   --   BILITOT 0.5  --   --   --   --   --   --   PROT 7.3  --   --   --   --   --   --   ALBUMIN 3.8   < > 3.3* 2.7* 2.7* 2.3* 2.1*   < > = values in this interval not displayed.   No results for input(s): LIPASE, AMYLASE in the last 168 hours. No results for input(s): AMMONIA in the last 168 hours. CBC: Recent Labs  Lab 11/22/19 1406 11/23/19 0808  WBC 13.0* 15.0*  NEUTROABS 10.8*  --   HGB 11.2* 11.4*  HCT 35.3* 35.2*  MCV 84.4 83.8  PLT 323 296   Cardiac Enzymes: No results for input(s): CKTOTAL, CKMB, CKMBINDEX, TROPONINI in the last 168 hours. BNP: Invalid input(s): POCBNP CBG: Recent Labs  Lab 11/28/19 1704 11/28/19 1950 11/28/19 2324 11/29/19 0337 11/29/19 0753  GLUCAP 232* 213* 218* 169* 196*    Radiological Exams on Admission: DG CHEST PORT 1 VIEW  Result Date: 11/28/2019 CLINICAL DATA:  Difficulty breathing. EXAM: PORTABLE CHEST 1 VIEW COMPARISON:  Chest x-ray dated September 19, 2019. FINDINGS: Feeding tube entering the stomach with the tip below the field of view. Unchanged borderline cardiomegaly. Atherosclerotic calcification of the aortic arch. Normal pulmonary vascularity. No focal consolidation, pleural effusion, or pneumothorax. No acute osseous abnormality. IMPRESSION: No active disease. Electronically Signed   By: Titus Dubin M.D.   On: 11/28/2019 14:20    EKG independently reviewed: Normal sinus rhythm   Time spent: 25 min.  [facility]   If 7PM-7AM,  please contact night-coverage [email or web address]  11/29/2019, 8:13 AM

## 2019-11-30 ENCOUNTER — Encounter: Payer: Self-pay | Admitting: Internal Medicine

## 2019-11-30 LAB — GLUCOSE, CAPILLARY
Glucose-Capillary: 216 mg/dL — ABNORMAL HIGH (ref 70–99)
Glucose-Capillary: 225 mg/dL — ABNORMAL HIGH (ref 70–99)
Glucose-Capillary: 218 mg/dL — ABNORMAL HIGH (ref 70–99)
Glucose-Capillary: 164 mg/dL — ABNORMAL HIGH (ref 70–99)
Glucose-Capillary: 187 mg/dL — ABNORMAL HIGH (ref 70–99)

## 2019-11-30 LAB — RENAL FUNCTION PANEL
Albumin: 2.2 g/dL — ABNORMAL LOW (ref 3.5–5.0)
Anion gap: 10 (ref 5–15)
BUN: 46 mg/dL — ABNORMAL HIGH (ref 8–23)
CO2: 22 mmol/L (ref 22–32)
Calcium: 9.1 mg/dL (ref 8.9–10.3)
Chloride: 108 mmol/L (ref 98–111)
Creatinine, Ser: 1.14 mg/dL — ABNORMAL HIGH (ref 0.44–1.00)
GFR calc Af Amer: 51 mL/min — ABNORMAL LOW (ref >60)
GFR calc non Af Amer: 44 mL/min — ABNORMAL LOW (ref >60)
Glucose, Bld: 251 mg/dL — ABNORMAL HIGH (ref 70–99)
Phosphorus: 4.4 mg/dL (ref 2.5–4.6)
Potassium: 4.9 mmol/L (ref 3.5–5.1)
Sodium: 140 mmol/L (ref 135–145)

## 2019-11-30 MED ORDER — INSULIN GLARGINE 100 UNIT/ML ~~LOC~~ SOLN
15.0000 [IU] | Freq: Every day | SUBCUTANEOUS | Status: DC
  Administered 2019-11-30 – 2019-12-02 (×3): 15 [IU] via SUBCUTANEOUS
  Filled 2019-11-30 (×3): qty 0.15

## 2019-11-30 MED ORDER — IRBESARTAN 150 MG PO TABS
150.0000 mg | ORAL_TABLET | Freq: Every day | ORAL | Status: DC
  Administered 2019-11-30 – 2019-12-05 (×5): 150 mg via NASOGASTRIC
  Filled 2019-11-30 (×5): qty 1

## 2019-11-30 MED ORDER — HYDRALAZINE HCL 50 MG PO TABS
50.0000 mg | ORAL_TABLET | Freq: Three times a day (TID) | ORAL | Status: DC
  Administered 2019-11-30 – 2019-12-07 (×14): 50 mg
  Filled 2019-11-30 (×18): qty 1

## 2019-11-30 MED ORDER — OSMOLITE 1.2 CAL PO LIQD
1000.0000 mL | ORAL | Status: DC
  Administered 2019-11-30 – 2019-12-07 (×3): 1000 mL
  Filled 2019-11-30 (×11): qty 1000

## 2019-11-30 NOTE — Progress Notes (Signed)
  Speech Language Pathology Treatment: Dysphagia  Patient Details Name: [name] MRN: [id number] DOB: 06-13-1931 Today's Date: 11/30/2019 Time: 4259-5638 SLP Time Calculation (min) (ACUTE ONLY): 18 min  Assessment / Plan / Recommendation Clinical Impression  Pt is very lethargic this morning, not arousing sufficiently for PO trials despite Max stimulation. She did open her eyes x2 with repositioning in bed and closed her mouth around a swab for oral care, but otherwise showed no response to stimulation. Her husband was present for session and was educated about current impact that mentation has been having on swallowing. All questions were answered at this time. Will continue to follow - pt may need longer-term access to alternative means of nutrition while healing if current mentation persists.    HPI HPI: 86 yo female admitted with increasing AMS s/p fall. CT showed bil parafalacine L>R acute SDH with brain compression. PMH: BPPV, Bradycardia, CAD, CKD, DM, HLD, ACDF 2003, CVA, meniere disease, memory problems, GERD      SLP Plan  Continue with current plan of care       Recommendations  Diet recommendations: NPO Medication Administration: Via alternative means                Oral Care Recommendations: Oral care QID Follow up Recommendations: Skilled Nursing facility SLP Visit Diagnosis: Dysphagia, unspecified (R13.10) Plan: Continue with current plan of care       GO                Osie Bond., M.A. [facility] Pager [phone] Office [phone]  11/30/2019, 12:01 PM

## 2019-11-30 NOTE — Progress Notes (Signed)
Noted patient still with cortrak (dobhoff)- pt is unable to go to SNF with this- will need a PEG for placement needs- MD please address this. Thank you TOC team.

## 2019-11-30 NOTE — Progress Notes (Signed)
Inpatient Diabetes Program Recommendations  AACE/ADA: New Consensus Statement on Inpatient Glycemic Control (2015)  Target Ranges:  Prepandial:   less than 140 mg/dL      Peak postprandial:   less than 180 mg/dL (1-2 hours)      Critically ill patients:  140 - 180 mg/dL   Lab Results  Component Value Date   GLUCAP 218 (H) 11/30/2019   HGBA1C 7.9 (H) 11/22/2019    Review of Glycemic Control Results for [name], [name] (MRN [id number]) as of 11/30/2019 11:56  Ref. Range 11/29/2019 20:22 11/29/2019 23:53 11/30/2019 04:45 11/30/2019 08:15 11/30/2019 11:46  Glucose-Capillary Latest Ref Range: 70 - 99 mg/dL 216 (H) 259 (H) 216 (H) 225 (H) 218 (H)    Inpatient Diabetes Program Recommendations:    Novolog 3 units Q4H tube feed coverage; stop if feeds are held or discontinued  Will continue to follow while inpatient.  Thank you, Reche Dixon, RN, BSN Diabetes Coordinator Inpatient Diabetes Program [phone] (team pager from 8a-5p)

## 2019-11-30 NOTE — Progress Notes (Signed)
Nutrition Follow-up  DOCUMENTATION CODES:   Severe malnutrition in context of chronic illness  INTERVENTION:  Increase Osmolite 1.2 formula via Cortrak NGT by 10 ml every 4 hours to goal rate of 55 ml/hr.   Tube feeding to provide 1584 kcal, 73 grams of protein, and 1082 ml free water.   NUTRITION DIAGNOSIS:   Severe Malnutrition related to chronic illness (CAD, CKD, DM) as evidenced by severe fat depletion, severe muscle depletion; ongoing  GOAL:   Patient will meet greater than or equal to 90% of their needs; met with TF  MONITOR:   TF tolerance  REASON FOR ASSESSMENT:   Consult Enteral/tube feeding initiation and management  ASSESSMENT:   Pt with PMH of CAD, HTN, HLD, DM, stroke, CKD, and GERD admitted s/p multiple falls with acute bilateral SDH. Cortrak placed 8/13 with tip of tube in stomach.   Pt lethargic and continues on NPO status.  Family at bedside reports pt has been mostly sleeping and unable to be awaken. Pt continues on tube feeding and has been tolerating it well. Per SLP, pt will lkely need longer term feeding access for nutrition. Per Case manager, pt will nee PEG for SNF placement. Cleviprex weaned off. RD to modify tube feeding orders to better meet nutrition needs as pt no longer on cleviprex.   Labs and medications reviewed.   Diet Order:   Diet Order            Diet NPO time specified  Diet effective now                 EDUCATION NEEDS:   No education needs have been identified at this time  Skin:  Skin Assessment: Reviewed RN Assessment  Last BM:  8/16  Height:   Ht Readings from Last 1 Encounters:  11/22/19 5' (1.524 m)    Weight:   Wt Readings from Last 1 Encounters:  11/30/19 56.7 kg    Ideal Body Weight:  45.4 kg  BMI:  Body mass index is 24.41 kg/m.  Estimated Nutritional Needs:   Kcal:  1450-1700  Protein:  70-80 grams  Fluid:  > 1.5 L/day  Corrin [name], MS, RD, LDN RD pager number/after hours weekend  pager number on Amion.

## 2019-11-30 NOTE — Progress Notes (Signed)
Neurosurgery Service Progress Note  Subjective: No acute events overnight, no report of clinical seizures for nursing  Objective: Vitals:   11/29/19 2355 11/30/19 0414 11/30/19 0500 11/30/19 0819  BP: (!) 150/45 (!) 160/63  (!) 193/54  Pulse: 80 88  100  Resp: (!) 24 (!) 24  (!) 21  Temp: 100.1 F (37.8 C) 100 F (37.8 C)  (!) 100.5 F (38.1 C)  TempSrc: Axillary Axillary  Axillary  SpO2: 98% 97%  98%  Weight:   56.7 kg   Height:       Temp (24hrs), Avg:99.7 F (37.6 C), Min:99 F (37.2 C), Max:100.5 F (38.1 C)  CBC Latest Ref Rng & Units 11/23/2019 11/22/2019 09/19/2019  WBC 4.0 - 10.5 K/uL 15.0(H) 13.0(H) 7.9  Hemoglobin 12.0 - 15.0 g/dL 11.4(L) 11.2(L) 9.4(L)  Hematocrit 36 - 46 % 35.2(L) 35.3(L) 31.4(L)  Platelets 150 - 400 K/uL 296 323 374   BMP Latest Ref Rng & Units 11/30/2019 11/29/2019 11/29/2019  Glucose 70 - 99 mg/dL 251(H) - 206(H)  BUN 8 - 23 mg/dL 46(H) - 42(H)  Creatinine 0.44 - 1.00 mg/dL 1.14(H) - 1.09(H)  BUN/Creat Ratio 6 - 22 (calc) - - -  Sodium 135 - 145 mmol/L 140 - 143  Potassium 3.5 - 5.1 mmol/L 4.9 5.9(H) 5.5(H)  Chloride 98 - 111 mmol/L 108 - 113(H)  CO2 22 - 32 mmol/L 22 - 20(L)  Calcium 8.9 - 10.3 mg/dL 9.1 - 8.8(L)    Intake/Output Summary (Last 24 hours) at 11/30/2019 0906 Last data filed at 11/30/2019 0630 Gross per 24 hour  Intake 1742.63 ml  Output 850 ml  Net 892.63 ml    Current Facility-Administered Medications:  .  0.45 % sodium chloride infusion, , Intravenous, Continuous, Shawna Clamp, MD, Last Rate: 75 mL/hr at 11/30/19 0636, New Bag at 11/30/19 0636 .  acetaminophen (TYLENOL) tablet 650 mg, 650 mg, Per Tube, Q4H PRN, 650 mg at 11/29/19 0900 **OR** acetaminophen (TYLENOL) suppository 650 mg, 650 mg, Rectal, Q4H PRN, Meyran, Ocie Cornfield, NP .  azelastine (ASTELIN) 0.1 % nasal spray 1 spray, 1 spray, Each Nare, BID, Shawna Clamp, MD, 1 spray at 11/29/19 2143 .  chlorhexidine (PERIDEX) 0.12 % solution 15 mL, 15 mL, Mouth  Rinse, BID, Khloey Chern, Joyice Faster, MD, 15 mL at 11/29/19 2142 .  Chlorhexidine Gluconate Cloth 2 % PADS 6 each, 6 each, Topical, Q0600, Judith Part, MD, 6 each at 11/29/19 1500 .  clevidipine (CLEVIPREX) infusion 0.5 mg/mL, 0-21 mg/hr, Intravenous, Continuous, Wandy Bossler, Joyice Faster, MD, Stopped at 11/28/19 1110 .  docusate (COLACE) 50 MG/5ML liquid 100 mg, 100 mg, Per Tube, BID, Judith Part, MD, 100 mg at 11/29/19 2142 .  enalaprilat (VASOTEC) injection 0.625 mg, 0.625 mg, Intravenous, Q6H PRN, Judith Part, MD, 0.625 mg at 11/28/19 1709 .  feeding supplement (OSMOLITE 1.2 CAL) liquid 1,000 mL, 1,000 mL, Per Tube, Continuous, Shelby Peltz, Joyice Faster, MD, Last Rate: 40 mL/hr at 11/29/19 1407, 1,000 mL at 11/29/19 1407 .  feeding supplement (PROSource TF) liquid 45 mL, 45 mL, Per Tube, BID, Jourden Gilson A, MD, 45 mL at 11/29/19 2142 .  furosemide (LASIX) tablet 20 mg, 20 mg, Per Tube, Once per day on Mon Wed Fri, Kahley Leib, Joyice Faster, MD, 20 mg at 11/28/19 0947 .  heparin injection 5,000 Units, 5,000 Units, Subcutaneous, Q8H, Judith Part, MD, 5,000 Units at 11/30/19 0501 .  insulin aspart (novoLOG) injection 0-9 Units, 0-9 Units, Subcutaneous, Q4H, Kushi Kun, Joyice Faster, MD, 3 Units at  11/30/19 0459 .  insulin glargine (LANTUS) injection 15 Units, 15 Units, Subcutaneous, Daily, Pahwani, Ravi, MD .  labetalol (NORMODYNE) tablet 100 mg, 100 mg, Per Tube, Q6H, Shawna Clamp, MD, 100 mg at 11/30/19 0004 .  levETIRAcetam (KEPPRA) IVPB 1000 mg/100 mL premix, 1,000 mg, Intravenous, Q12H, Judith Part, MD, Last Rate: 400 mL/hr at 11/29/19 2159, 1,000 mg at 11/29/19 2159 .  LORazepam (ATIVAN) injection 2 mg, 2 mg, Intravenous, Q2H PRN, Judith Part, MD, 2 mg at 11/25/19 0923 .  minoxidil (LONITEN) tablet 5 mg, 5 mg, Per Tube, Daily, Rise Patience, MD, 5 mg at 11/29/19 0900 .  nitroGLYCERIN (NITROSTAT) SL tablet 0.4 mg, 0.4 mg, Sublingual, Q5 min PRN, Shekira Drummer,  Timmy Bubeck A, MD .  ondansetron (ZOFRAN) tablet 4 mg, 4 mg, Oral, Q4H PRN **OR** ondansetron (ZOFRAN) injection 4 mg, 4 mg, Intravenous, Q4H PRN, Torrian Canion A, MD .  polyethylene glycol (MIRALAX / GLYCOLAX) packet 17 g, 17 g, Per Tube, Daily PRN, Shawna Clamp, MD, 17 g at 11/27/19 1636 .  promethazine (PHENERGAN) tablet 12.5-25 mg, 12.5-25 mg, Oral, Q4H PRN, Judith Part, MD .  simvastatin (ZOCOR) tablet 20 mg, 20 mg, Per Tube, Q M,W,F, Judith Part, MD, 20 mg at 11/28/19 0945 .  valproate (DEPACON) 250 mg in dextrose 5 % 50 mL IVPB, 250 mg, Intravenous, Q8H, Bhagat, Srishti L, MD, Last Rate: 52.5 mL/hr at 11/30/19 0655, 250 mg at 11/30/19 1610   Physical Exam: Eyes open to voice, minimal verbal output / muttering, stable level of alertness to yesterday, stable mild response to painful stimulus, PERRL, not FC but MAEx4 to stimulus, localizes bilaterally in upper extremities  Assessment & Plan: 86 y.o. woman s/p fall, on plavix for CAD, CTH w/ bilateral parafalcine acute SDH.  -continues to improve very slowly  -f/u neurology recs, off EEG, on levetiracetam + valproate -medicine recs -can start discharge planning, likely will require weeks to recover from this, I don't think we can do anything else as an inpatient to optimize her from a neurologic status -will require a SNF that will take a dobhoff -SQH for DVT PPx  Judith Part  11/30/19 9:06 AM

## 2019-11-30 NOTE — Progress Notes (Signed)
Physical Therapy Treatment Patient Details Name: [name] MRN: [id number] DOB: Sep 07, 1931 Today's Date: 11/30/2019    History of Present Illness 86 yo female admitted to ED on 8/10 for fall, progressive confusion, now nonverbal. CT head demonstrates bilateral parafalacine L>R acute subdural hematomas with brain compression but basal cisterns patent. PMH includes BPPV, CAD s/p MI, CKD III, DM, dyslipidemia, memory changes, osteoporosis.    PT Comments    Patient seen with OT to work to increase stimulation with EOB activity.  She did not respond to much with limited eye opening and only moving L UE in response to OT cleaning her teeth while up in bed in chair position.  She still grimaces to cervical rotation to R with very stiff neck.  Remains appropriate for SNF level rehab at d/c.  Follow Up Recommendations  SNF;Supervision/Assistance - 24 hour     Equipment Recommendations  None recommended by PT    Recommendations for Other Services       Precautions / Restrictions Precautions Precautions: Fall Precaution Comments: R neglect (difficult to discern due to cognitive status); possible seizures    Mobility  Bed Mobility Overal bed mobility: Needs Assistance Bed Mobility: Rolling;Sidelying to Sit Rolling: Total assist Sidelying to sit: Total assist;+2 for physical assistance   Sit to supine: Total assist;+2 for physical assistance   General bed mobility comments: pt lethargic and not assisting much  Transfers                 General transfer comment: NT  Ambulation/Gait                 Stairs             Wheelchair Mobility    Modified Rankin (Stroke Patients Only)       Balance Overall balance assessment: Needs assistance Sitting-balance support: Feet supported Sitting balance-Leahy Scale: Zero Sitting balance - Comments: full trunk support for sitting EOB; rocking while playing soft gospel music to work on weight shift and  stimulation without much success                                    Cognition Arousal/Alertness: Lethargic Behavior During Therapy: Flat affect Overall Cognitive Status: Difficult to assess Area of Impairment: Attention;Following commands                   Current Attention Level: Focused   Following Commands: Follows one step commands with increased time;Follows one step commands inconsistently       General Comments: opens eye briefly to command, Lifting hand to move OT out of the way when brushing her teeth      Exercises      General Comments General comments (skin integrity, edema, etc.): VSS, but remains too lethargic for OOB      Pertinent Vitals/Pain Pain Assessment: Faces Faces Pain Scale: Hurts little more Pain Location: neck with ROM activities Pain Descriptors / Indicators: Grimacing Pain Intervention(s): Monitored during session;Repositioned;Limited activity within patient's tolerance    Home Living                      Prior Function            PT Goals (current goals can now be found in the care plan section) Progress towards PT goals: Not progressing toward goals - comment    Frequency    Min 2X/week  PT Plan Current plan remains appropriate    Co-evaluation PT/OT/SLP Co-Evaluation/Treatment: Yes Reason for Co-Treatment: For patient/therapist safety;Necessary to address cognition/behavior during functional activity;To address functional/ADL transfers PT goals addressed during session: Mobility/safety with mobility;Balance        AM-PAC PT "6 Clicks" Mobility   Outcome Measure  Help needed turning from your back to your side while in a flat bed without using bedrails?: Total Help needed moving from lying on your back to sitting on the side of a flat bed without using bedrails?: Total Help needed moving to and from a bed to a chair (including a wheelchair)?: Total Help needed standing up from a chair  using your arms (e.g., wheelchair or bedside chair)?: Total Help needed to walk in hospital room?: Total Help needed climbing 3-5 steps with a railing? : Total 6 Click Score: 6    End of Session   Activity Tolerance: Patient limited by fatigue;Patient limited by lethargy Patient left: in bed;with call bell/phone within reach;with bed alarm set (reclined chair position in bed)   PT Visit Diagnosis: Muscle weakness (generalized) (M62.81);Other abnormalities of gait and mobility (R26.89);Other symptoms and signs involving the nervous system (R29.898)     Time: 4259-5638 PT Time Calculation (min) (ACUTE ONLY): 25 min  Charges:  $Therapeutic Activity: 8-22 mins                     Magda Kiel, PT Acute Rehabilitation Services VFIEP:[phone] Office:[phone] 11/30/2019    [name] 11/30/2019, 4:08 PM

## 2019-11-30 NOTE — Progress Notes (Signed)
PROGRESS NOTE    [name]  MVH:[id number] DOB: 1931/09/17 DOA: 11/22/2019 PCP: [name], MD   Brief Narrative:  History of CAD, hypertension had a fall and was brought to the ER where patient was found to be confused and was throwing up. CT head showed subdural hematoma and patient was admitted for further management. During admission patient also was found to have seizures. Neurology was consulted. Patient was loaded on antiepileptics. Patient was found to have bradycardic episodes.   Assessment & Plan:   Principal Problem:   Bradycardia Active Problems:   Subdural hematoma (HCC)   Protein-calorie malnutrition, severe  1. Sinus bradycardia- Improved . Patient is back into normal heart rhythm. clonidine discontinued and replaced with minoxidil after discussing with cardiology. Patient is allergic to multiple medications. We will continue with ARB. Closely monitoring telemetry. Started labetalol 100 mg every 6 hours,  Successfully weaned her off from Cleviprex.  2. Subdural hematoma:  being managed by neurosurgery/primary team.  3. Seizures:  Controlled,  being managed by neurology.   4. History of CAD. Stable.  5. Chronic kidney disease stage II. Stable.   6. Hypertension: Still significantly elevated.  Will resume her valsartan on and add hydralazine 50 mg 3 times daily and continue her labetalol.  7. Hyperkalemia: Resolved.  8.  Low-grade fever of 100.5 earlier this morning.  No source of infection identified.  Wondering if this is due to head bleed.  Will defer to primary service to weigh on that.  Monitor for now.  DVT prophylaxis: heparin injection 5,000 Units Start: 11/24/19 0600 SCDs Start: 11/22/19 1958 Place TED hose Start: 11/22/19 1958   Code Status: DNR  Family Communication: None present at bedside.   Status is: Inpatient  Remains inpatient appropriate because:Inpatient level of care appropriate due to severity of illness   Dispo:  The patient is from: Home              Anticipated d/c is to: SNF              Anticipated d/c date is: 3 days             Estimated body mass index is 24.41 kg/m as calculated from the following:   Height as of this encounter: 5' (1.524 m).   Weight as of this encounter: 56.7 kg.      Nutritional status:  Nutrition Problem: Severe Malnutrition Etiology: chronic illness (CAD, CKD, DM)   Signs/Symptoms: severe fat depletion, severe muscle depletion   Interventions: Tube feeding    Consultants:   Hospitalist and neurology  Procedures:   None  Antimicrobials:  Anti-infectives (From admission, onward)   None         Subjective: Patient seen and examined.  She was alert and she was mumbling some words which were not comprehensible to me.  Objective: Vitals:   11/30/19 0414 11/30/19 0500 11/30/19 0819 11/30/19 1145  BP: (!) 160/63  (!) 193/54 (!) 132/39  Pulse: 88  100 71  Resp: (!) 24  (!) 21 20  Temp: 100 F (37.8 C)  (!) 100.5 F (38.1 C) 99.2 F (37.3 C)  TempSrc: Axillary  Axillary Axillary  SpO2: 97%  98% 99%  Weight:  56.7 kg    Height:        Intake/Output Summary (Last 24 hours) at 11/30/2019 1305 Last data filed at 11/30/2019 0800 Gross per 24 hour  Intake 1446.14 ml  Output 850 ml  Net 596.14 ml   Danley Danker  Weights   11/22/19 1535 11/30/19 0500  Weight: 47 kg 56.7 kg    Examination:  General exam: Appears calm and comfortable but mumbling some words Respiratory system: Clear to auscultation. Respiratory effort normal. Cardiovascular system: S1 & S2 heard, RRR. No JVD, murmurs, rubs, gallops or clicks. No pedal edema. Gastrointestinal system: Abdomen is nondistended, soft and nontender. No organomegaly or masses felt. Normal bowel sounds heard. Central nervous system: Alert but unable to assess orientation. Extremities: Symmetric 5 x 5 power.    Data Reviewed: I have personally reviewed following labs and imaging  studies  CBC: No results for input(s): WBC, NEUTROABS, HGB, HCT, MCV, PLT in the last 168 hours. Basic Metabolic Panel: Recent Labs  Lab 11/25/19 1044 11/25/19 1501 11/25/19 2325 11/25/19 2325 11/26/19 8185 11/26/19 6314 11/26/19 1655 11/27/19 0616 11/28/19 0218 11/29/19 0609 11/29/19 1157 11/30/19 0800  NA   < >  --  147*   < > 149*  --   --  150* 145 143  --  140  K   < >  --  3.1*   < > 3.4*   < >  --  3.3* 4.7 5.5* 5.9* 4.9  CL   < >  --  113*   < > 114*  --   --  114* 113* 113*  --  108  CO2   < >  --  23   < > 23  --   --  22 19* 20*  --  22  GLUCOSE   < >  --  99   < > 200*  --   --  224* 250* 206*  --  251*  BUN   < >  --  30*   < > 30*  --   --  28* 39* 42*  --  46*  CREATININE   < >  --  1.13*   < > 1.09*  --   --  0.96 1.11* 1.09*  --  1.14*  CALCIUM   < >  --  8.8*   < > 9.1  --   --  9.2 8.6* 8.8*  --  9.1  MG  --  1.9 1.8  --  1.8  --  1.8 1.8  --   --   --   --   PHOS   < > 2.9  --   --  2.0*   < > 2.4* 3.1 4.9* 4.4  --  4.4   < > = values in this interval not displayed.   GFR: Estimated Creatinine Clearance: 28.5 mL/min (A) (by C-G formula based on SCr of 1.14 mg/dL (H)). Liver Function Tests: Recent Labs  Lab 11/26/19 0621 11/27/19 0616 11/28/19 0218 11/29/19 0609 11/30/19 0800  ALBUMIN 2.7* 2.7* 2.3* 2.1* 2.2*   No results for input(s): LIPASE, AMYLASE in the last 168 hours. No results for input(s): AMMONIA in the last 168 hours. Coagulation Profile: No results for input(s): INR, PROTIME in the last 168 hours. Cardiac Enzymes: No results for input(s): CKTOTAL, CKMB, CKMBINDEX, TROPONINI in the last 168 hours. BNP (last 3 results) Recent Labs    02/25/19 1339 03/08/19 1422 03/25/19 1554  PROBNP 5,548* 5,028* 3,715*   HbA1C: No results for input(s): HGBA1C in the last 72 hours. CBG: Recent Labs  Lab 11/29/19 2022 11/29/19 2353 11/30/19 0445 11/30/19 0815 11/30/19 1146  GLUCAP 216* 259* 216* 225* 218*   Lipid Profile: Recent Labs     11/28/19 0218  TRIG 87  Thyroid Function Tests: No results for input(s): TSH, T4TOTAL, FREET4, T3FREE, THYROIDAB in the last 72 hours. Anemia Panel: No results for input(s): VITAMINB12, FOLATE, FERRITIN, TIBC, IRON, RETICCTPCT in the last 72 hours. Sepsis Labs: No results for input(s): PROCALCITON, LATICACIDVEN in the last 168 hours.  Recent Results (from the past 240 hour(s))  SARS Coronavirus 2 by RT PCR (hospital order, performed in [facility] hospital lab) Nasopharyngeal Nasopharyngeal Swab     Status: None   Collection Time: 11/22/19  3:29 PM   Specimen: Nasopharyngeal Swab  Result Value Ref Range Status   SARS Coronavirus 2 NEGATIVE NEGATIVE Final    Comment: (NOTE) SARS-CoV-2 target nucleic acids are NOT DETECTED.  The SARS-CoV-2 RNA is generally detectable in upper and lower respiratory specimens during the acute phase of infection. The lowest concentration of SARS-CoV-2 viral copies this assay can detect is 250 copies / mL. A negative result does not preclude SARS-CoV-2 infection and should not be used as the sole basis for treatment or other patient management decisions.  A negative result may occur with improper specimen collection / handling, submission of specimen other than nasopharyngeal swab, presence of viral mutation(s) within the areas targeted by this assay, and inadequate number of viral copies (<250 copies / mL). A negative result must be combined with clinical observations, patient history, and epidemiological information.  Fact Sheet for Patients:   StrictlyIdeas.no  Fact Sheet for Healthcare Providers: BankingDealers.co.za  This test is not yet approved or  cleared by the [place] FDA and has been authorized for detection and/or diagnosis of SARS-CoV-2 by FDA under an Emergency Use Authorization (EUA).  This EUA will remain in effect (meaning this test can be used) for the duration of the COVID-19  declaration under Section 564(b)(1) of the Act, 21 U.S.C. section 360bbb-3(b)(1), unless the authorization is terminated or revoked sooner.  Performed at [facility], [place] [address] [place], [place] [address]   MRSA PCR Screening     Status: None   Collection Time: 11/22/19  8:58 PM   Specimen: Nasal Mucosa; Nasopharyngeal  Result Value Ref Range Status   MRSA by PCR NEGATIVE NEGATIVE Final    Comment:        The GeneXpert MRSA Assay (FDA approved for NASAL specimens only), is one component of a comprehensive MRSA colonization surveillance program. It is not intended to diagnose MRSA infection nor to guide or monitor treatment for MRSA infections. Performed at [facility], [place] [address] [place], [place] [address]       Radiology Studies: DG CHEST PORT 1 VIEW  Result Date: 11/28/2019 CLINICAL DATA:  Difficulty breathing. EXAM: PORTABLE CHEST 1 VIEW COMPARISON:  Chest x-ray dated September 19, 2019. FINDINGS: Feeding tube entering the stomach with the tip below the field of view. Unchanged borderline cardiomegaly. Atherosclerotic calcification of the aortic arch. Normal pulmonary vascularity. No focal consolidation, pleural effusion, or pneumothorax. No acute osseous abnormality. IMPRESSION: No active disease. Electronically Signed   By: Titus Dubin M.D.   On: 11/28/2019 14:20    Scheduled Meds: . azelastine  1 spray Each Nare BID  . chlorhexidine  15 mL Mouth Rinse BID  . Chlorhexidine Gluconate Cloth  6 each Topical Q0600  . docusate  100 mg Per Tube BID  . feeding supplement (PROSource TF)  45 mL Per Tube BID  . furosemide  20 mg Per Tube Once per day on Mon Wed Fri  . heparin injection (subcutaneous)  5,000 Units Subcutaneous Q8H  .  hydrALAZINE  50 mg Per Tube Q8H  . insulin aspart  0-9 Units Subcutaneous Q4H  . insulin glargine  15 Units Subcutaneous Daily  . irbesartan  150 mg Per NG tube Daily  . labetalol  100 mg Per Tube Q6H  . minoxidil  5 mg Per  Tube Daily  . simvastatin  20 mg Per Tube Q M,W,F   Continuous Infusions: . sodium chloride 75 mL/hr at 11/30/19 0636  . clevidipine Stopped (11/28/19 1110)  . feeding supplement (OSMOLITE 1.2 CAL) 1,000 mL (11/29/19 1407)  . levETIRAcetam 1,000 mg (11/30/19 0939)  . valproate sodium 250 mg (11/30/19 1255)     LOS: 8 days   Time spent: 35 minutes   Darliss Cheney, MD [facility]  11/30/2019, 1:05 PM   To contact the attending provider between 7A-7P or the covering provider during after hours 7P-7A, please log into the web site [email or web address].

## 2019-11-30 NOTE — Progress Notes (Signed)
Occupational Therapy Treatment Patient Details Name: [name] MRN: [id number] DOB: [birth year] Today's Date: 11/30/2019    History of present illness 86 yo female admitted to ED on 8/10 for fall, progressive confusion, now nonverbal. CT head demonstrates bilateral parafalacine L>R acute subdural hematomas with brain compression but basal cisterns patent. PMH includes BPPV, CAD s/p MI, CKD III, DM, dyslipidemia, memory changes, osteoporosis.   OT comments  Pt seen for OT follow up session with focus on mobility progression. Pt remains total A +2 for mobility, overall very lethargic with limited engagement. Pt sat EOB with total A support ~8 mins. She was then placed in chair position to perform oral care. Pt requiring total A to complete task. Noted increased WOB throughout session with VSS on 2L [place]. Pt only following <25% of verbal commands this date and nonverbal in session. D/c recs remain appropriate, will continue to follow.    Follow Up Recommendations  SNF;Supervision/Assistance - 24 hour    Equipment Recommendations       Recommendations for Other Services Other (comment) (Palliative)    Precautions / Restrictions Precautions Precautions: Fall Precaution Comments: R neglect (difficult to discern due to cognitive status); possible seizures Restrictions Weight Bearing Restrictions: No       Mobility Bed Mobility Overal bed mobility: Needs Assistance Bed Mobility: Rolling;Sidelying to Sit Rolling: Total assist Sidelying to sit: Total assist;+2 for physical assistance   Sit to supine: Total assist;+2 for physical assistance   General bed mobility comments: pt lethargic and not assisting at all  Transfers                 General transfer comment: N/A    Balance Overall balance assessment: Needs assistance Sitting-balance support: Feet supported Sitting balance-Leahy Scale: Zero Sitting balance - Comments: full trunk support for sitting EOB; rocking while  playing soft gospel music to work on weight shift and stimulation without much success                                   ADL either performed or assessed with clinical judgement   ADL Overall ADL's : Needs assistance/impaired     Grooming: Oral care;Total assistance;Bed level Grooming Details (indicate cue type and reason): initiated grooming due to poor oral hygiene noted. Pt not spontaneously opening mouth or attending to task of brushing teeth.                               General ADL Comments: total (A) for all care at this time     Vision Baseline Vision/History: No visual deficits Patient Visual Report: No change from baseline Additional Comments: pt keeping eyes closed majority of session. Did open them a brief period this date without purposeful tracking or response to external threat   Perception     Praxis      Cognition Arousal/Alertness: Lethargic Behavior During Therapy: Flat affect Overall Cognitive Status: Difficult to assess Area of Impairment: Attention;Following commands                   Current Attention Level: Focused   Following Commands: Follows one step commands with increased time;Follows one step commands inconsistently       General Comments: following <25% of verbal commands. Briefly opened eyes x1 to verbal command. Attempting to lift hand to move OT out of the way when brushing  teeth. Pt nonvernal throughout session        Exercises     Shoulder Instructions       General Comments VSS, too lethargic for OOB. Noted increased WOB    Pertinent Vitals/ Pain       Pain Assessment: Faces Faces Pain Scale: Hurts little more Pain Location: neck with ROM activities Pain Descriptors / Indicators: Grimacing Pain Intervention(s): Monitored during session;Repositioned;Limited activity within patient's tolerance  Home Living                                          Prior  Functioning/Environment              Frequency  Min 2X/week        Progress Toward Goals  OT Goals(current goals can now be found in the care plan section)  Progress towards OT goals: Not progressing toward goals - comment (remains lethargic; total care)  Acute Rehab OT Goals OT Goal Formulation: Patient unable to participate in goal setting Time For Goal Achievement: 12/07/19 Potential to Achieve Goals: Poor  Plan Discharge plan remains appropriate    Co-evaluation    PT/OT/SLP Co-Evaluation/Treatment: Yes Reason for Co-Treatment: For patient/therapist safety;To address functional/ADL transfers;Complexity of the patient's impairments (multi-system involvement) PT goals addressed during session: Mobility/safety with mobility;Balance OT goals addressed during session: ADL's and self-care;Strengthening/ROM      AM-PAC OT "6 Clicks" Daily Activity     Outcome Measure   Help from another person eating meals?: Total Help from another person taking care of personal grooming?: Total Help from another person toileting, which includes using toliet, bedpan, or urinal?: Total Help from another person bathing (including washing, rinsing, drying)?: Total Help from another person to put on and taking off regular upper body clothing?: Total Help from another person to put on and taking off regular lower body clothing?: Total 6 Click Score: 6    End of Session    OT Visit Diagnosis: Unsteadiness on feet (R26.81);Muscle weakness (generalized) (M62.81);Other symptoms and signs involving cognitive function   Activity Tolerance Patient limited by lethargy   Patient Left in bed;with call bell/phone within reach   Nurse Communication Mobility status        Time: 1353-1416 OT Time Calculation (min): 23 min  Charges: OT General Charges $OT Visit: 1 Visit OT Treatments $Self Care/Home Management : 8-22 mins  Zenovia Jarred, MSOT, OTR/L [facility]  Office Number: [phone] Pager: [phone]  Zenovia Jarred 11/30/2019, 6:28 PM

## 2019-12-01 ENCOUNTER — Inpatient Hospital Stay (HOSPITAL_COMMUNITY): Payer: PPO

## 2019-12-01 LAB — CBC WITH DIFFERENTIAL/PLATELET
Abs Immature Granulocytes: 0.1 10*3/uL — ABNORMAL HIGH (ref 0.00–0.07)
Basophils Absolute: 0 10*3/uL (ref 0.0–0.1)
Basophils Relative: 0 %
Eosinophils Absolute: 0.2 10*3/uL (ref 0.0–0.5)
Eosinophils Relative: 2 %
HCT: 24.7 % — ABNORMAL LOW (ref 36.0–46.0)
Hemoglobin: 7.7 g/dL — ABNORMAL LOW (ref 12.0–15.0)
Immature Granulocytes: 1 %
Lymphocytes Relative: 9 %
Lymphs Abs: 0.9 10*3/uL (ref 0.7–4.0)
MCH: 27.7 pg (ref 26.0–34.0)
MCHC: 31.2 g/dL (ref 30.0–36.0)
MCV: 88.8 fL (ref 80.0–100.0)
Monocytes Absolute: 0.8 10*3/uL (ref 0.1–1.0)
Monocytes Relative: 8 %
Neutro Abs: 8.1 10*3/uL — ABNORMAL HIGH (ref 1.7–7.7)
Neutrophils Relative %: 80 %
Platelets: 262 10*3/uL (ref 150–400)
RBC: 2.78 MIL/uL — ABNORMAL LOW (ref 3.87–5.11)
RDW: 13.8 % (ref 11.5–15.5)
WBC: 10 10*3/uL (ref 4.0–10.5)
nRBC: 0 % (ref 0.0–0.2)

## 2019-12-01 LAB — RENAL FUNCTION PANEL
Albumin: 2.1 g/dL — ABNORMAL LOW (ref 3.5–5.0)
Anion gap: 9 (ref 5–15)
BUN: 50 mg/dL — ABNORMAL HIGH (ref 8–23)
CO2: 23 mmol/L (ref 22–32)
Calcium: 8.6 mg/dL — ABNORMAL LOW (ref 8.9–10.3)
Chloride: 104 mmol/L (ref 98–111)
Creatinine, Ser: 1.09 mg/dL — ABNORMAL HIGH (ref 0.44–1.00)
GFR calc Af Amer: 54 mL/min — ABNORMAL LOW (ref >60)
GFR calc non Af Amer: 46 mL/min — ABNORMAL LOW (ref >60)
Glucose, Bld: 253 mg/dL — ABNORMAL HIGH (ref 70–99)
Phosphorus: 4.9 mg/dL — ABNORMAL HIGH (ref 2.5–4.6)
Potassium: 4.9 mmol/L (ref 3.5–5.1)
Sodium: 136 mmol/L (ref 135–145)

## 2019-12-01 LAB — GLUCOSE, CAPILLARY
Glucose-Capillary: 155 mg/dL — ABNORMAL HIGH (ref 70–99)
Glucose-Capillary: 190 mg/dL — ABNORMAL HIGH (ref 70–99)
Glucose-Capillary: 207 mg/dL — ABNORMAL HIGH (ref 70–99)
Glucose-Capillary: 229 mg/dL — ABNORMAL HIGH (ref 70–99)
Glucose-Capillary: 241 mg/dL — ABNORMAL HIGH (ref 70–99)
Glucose-Capillary: 259 mg/dL — ABNORMAL HIGH (ref 70–99)

## 2019-12-01 MED ORDER — INSULIN ASPART 100 UNIT/ML ~~LOC~~ SOLN
3.0000 [IU] | SUBCUTANEOUS | Status: DC
  Administered 2019-12-01 – 2019-12-08 (×30): 3 [IU] via SUBCUTANEOUS

## 2019-12-01 NOTE — Progress Notes (Signed)
PROGRESS NOTE    [name]  WRU:[id number] DOB: 08/12/1931 DOA: 11/22/2019 PCP: [name], MD   Brief Narrative:  History of CAD, hypertension had a fall and was brought to the ER where patient was found to be confused and was throwing up. CT head showed subdural hematoma and patient was admitted for further management. During admission patient also was found to have seizures. Neurology was consulted. Patient was loaded on antiepileptics. Patient was found to have bradycardic episodes.   Assessment & Plan:   Principal Problem:   Bradycardia Active Problems:   Subdural hematoma (HCC)   Protein-calorie malnutrition, severe  1. Sinus bradycardia- Improved . Patient is back into normal heart rhythm. clonidine discontinued and replaced with minoxidil after discussing with cardiology. Patient is allergic to multiple medications. We will continue with ARB. Closely monitoring telemetry. Started labetalol 100 mg every 6 hours,  Successfully weaned her off from Cleviprex.  2. Subdural hematoma:  being managed by neurosurgery/primary team.  3. Seizures:  Controlled,  being managed by neurology.   4. History of CAD. Stable.  5. Chronic kidney disease stage II. Stable.   6. Hypertension: Blood pressure was slightly elevated early morning, prior to her morning medications were given.  Now it is getting better.  I will continue current regimen which is valsartan, hydralazine and labetalol.  7. Hyperkalemia: Resolved.  8.  Low-grade fever of 100.5 on the morning of 11/30/2019 at 8 AM.  No source of infection identified.  She has remained afebrile since then.  9.  Type 2 diabetes mellitus: Blood sugar still elevated despite of adding 15 units of Lantus yesterday.  Continue sliding scale insulin.  Add 3 units of NovoLog every 4 hours subcu per diabetes coordinator recommendation.  DVT prophylaxis: SCDs Start: 11/22/19 1958 Place TED hose Start: 11/22/19 1958   Code  Status: DNR  Family Communication: None present at bedside.   Status is: Inpatient  Remains inpatient appropriate because:Inpatient level of care appropriate due to severity of illness   Dispo: The patient is from: Home              Anticipated d/c is to: SNF              Anticipated d/c date is: 3 days or at discretion of primary team/neurosurgery.             Estimated body mass index is 24.41 kg/m as calculated from the following:   Height as of this encounter: 5' (1.524 m).   Weight as of this encounter: 56.7 kg.      Nutritional status:  Nutrition Problem: Severe Malnutrition Etiology: chronic illness (CAD, CKD, DM)   Signs/Symptoms: severe fat depletion, severe muscle depletion   Interventions: Tube feeding    Consultants:   Hospitalist and neurology  Procedures:   None  Antimicrobials:  Anti-infectives (From admission, onward)   None         Subjective: Patient seen and examined.  She was too lethargic to have any conversation with me today.  Looked comfortable.  Objective: Vitals:   12/01/19 0643 12/01/19 0904 12/01/19 1212 12/01/19 1300  BP: (!) 181/55 (!) 160/60 118/74   Pulse: 90 85 81 84  Resp:  19 20 19   Temp:  98.7 F (37.1 C) 99 F (37.2 C)   TempSrc:  Oral Axillary   SpO2:  99% 98% 99%  Weight:      Height:        Intake/Output Summary (Last 24 hours)  at 12/01/2019 1358 Last data filed at 12/01/2019 0400 Gross per 24 hour  Intake 450 ml  Output 750 ml  Net -300 ml   Filed Weights   11/22/19 1535 11/30/19 0500 12/01/19 0500  Weight: 47 kg 56.7 kg 56.7 kg    Examination:  General exam: Very lethargic. Respiratory system: Clear to auscultation. Respiratory effort normal. Cardiovascular system: S1 & S2 heard, RRR. No JVD, murmurs, rubs, gallops or clicks. No pedal edema. Gastrointestinal system: Abdomen is nondistended, soft and nontender. No organomegaly or masses felt. Normal bowel sounds heard. Skin: No rashes,  lesions or ulcers.   Data Reviewed: I have personally reviewed following labs and imaging studies  CBC: Recent Labs  Lab 12/01/19 0827  WBC 10.0  NEUTROABS 8.1*  HGB 7.7*  HCT 24.7*  MCV 88.8  PLT 409   Basic Metabolic Panel: Recent Labs  Lab 11/25/19 1044 11/25/19 1501 11/25/19 2325 11/25/19 2325 11/26/19 0621 11/26/19 0621 11/26/19 1655 11/27/19 0616 11/27/19 0616 11/28/19 0218 11/29/19 0609 11/29/19 1157 11/30/19 0800 12/01/19 0827  NA   < >  --  147*   < > 149*   < >  --  150*  --  145 143  --  140 136  K   < >  --  3.1*   < > 3.4*   < >  --  3.3*   < > 4.7 5.5* 5.9* 4.9 4.9  CL   < >  --  113*   < > 114*   < >  --  114*  --  113* 113*  --  108 104  CO2   < >  --  23   < > 23   < >  --  22  --  19* 20*  --  22 23  GLUCOSE   < >  --  99   < > 200*   < >  --  224*  --  250* 206*  --  251* 253*  BUN   < >  --  30*   < > 30*   < >  --  28*  --  39* 42*  --  46* 50*  CREATININE   < >  --  1.13*   < > 1.09*   < >  --  0.96  --  1.11* 1.09*  --  1.14* 1.09*  CALCIUM   < >  --  8.8*   < > 9.1   < >  --  9.2  --  8.6* 8.8*  --  9.1 8.6*  MG  --  1.9 1.8  --  1.8  --  1.8 1.8  --   --   --   --   --   --   PHOS   < > 2.9  --   --  2.0*   < > 2.4* 3.1  --  4.9* 4.4  --  4.4 4.9*   < > = values in this interval not displayed.   GFR: Estimated Creatinine Clearance: 29.8 mL/min (A) (by C-G formula based on SCr of 1.09 mg/dL (H)). Liver Function Tests: Recent Labs  Lab 11/27/19 0616 11/28/19 0218 11/29/19 0609 11/30/19 0800 12/01/19 0827  ALBUMIN 2.7* 2.3* 2.1* 2.2* 2.1*   No results for input(s): LIPASE, AMYLASE in the last 168 hours. No results for input(s): AMMONIA in the last 168 hours. Coagulation Profile: No results for input(s): INR, PROTIME in the last 168 hours. Cardiac Enzymes: No results  for input(s): CKTOTAL, CKMB, CKMBINDEX, TROPONINI in the last 168 hours. BNP (last 3 results) Recent Labs    02/25/19 1339 03/08/19 1422 03/25/19 1554  PROBNP  5,548* 5,028* 3,715*   HbA1C: No results for input(s): HGBA1C in the last 72 hours. CBG: Recent Labs  Lab 11/30/19 2022 12/01/19 0053 12/01/19 0500 12/01/19 0902 12/01/19 1214  GLUCAP 187* 207* 190* 229* 259*   Lipid Profile: No results for input(s): CHOL, HDL, LDLCALC, TRIG, CHOLHDL, LDLDIRECT in the last 72 hours. Thyroid Function Tests: No results for input(s): TSH, T4TOTAL, FREET4, T3FREE, THYROIDAB in the last 72 hours. Anemia Panel: No results for input(s): VITAMINB12, FOLATE, FERRITIN, TIBC, IRON, RETICCTPCT in the last 72 hours. Sepsis Labs: No results for input(s): PROCALCITON, LATICACIDVEN in the last 168 hours.  Recent Results (from the past 240 hour(s))  SARS Coronavirus 2 by RT PCR (hospital order, performed in [facility] hospital lab) Nasopharyngeal Nasopharyngeal Swab     Status: None   Collection Time: 11/22/19  3:29 PM   Specimen: Nasopharyngeal Swab  Result Value Ref Range Status   SARS Coronavirus 2 NEGATIVE NEGATIVE Final    Comment: (NOTE) SARS-CoV-2 target nucleic acids are NOT DETECTED.  The SARS-CoV-2 RNA is generally detectable in upper and lower respiratory specimens during the acute phase of infection. The lowest concentration of SARS-CoV-2 viral copies this assay can detect is 250 copies / mL. A negative result does not preclude SARS-CoV-2 infection and should not be used as the sole basis for treatment or other patient management decisions.  A negative result may occur with improper specimen collection / handling, submission of specimen other than nasopharyngeal swab, presence of viral mutation(s) within the areas targeted by this assay, and inadequate number of viral copies (<250 copies / mL). A negative result must be combined with clinical observations, patient history, and epidemiological information.  Fact Sheet for Patients:   StrictlyIdeas.no  Fact Sheet for Healthcare  Providers: BankingDealers.co.za  This test is not yet approved or  cleared by the [place] FDA and has been authorized for detection and/or diagnosis of SARS-CoV-2 by FDA under an Emergency Use Authorization (EUA).  This EUA will remain in effect (meaning this test can be used) for the duration of the COVID-19 declaration under Section 564(b)(1) of the Act, 21 U.S.C. section 360bbb-3(b)(1), unless the authorization is terminated or revoked sooner.  Performed at [facility], [place] [address] [place], [place] [address]   MRSA PCR Screening     Status: None   Collection Time: 11/22/19  8:58 PM   Specimen: Nasal Mucosa; Nasopharyngeal  Result Value Ref Range Status   MRSA by PCR NEGATIVE NEGATIVE Final    Comment:        The GeneXpert MRSA Assay (FDA approved for NASAL specimens only), is one component of a comprehensive MRSA colonization surveillance program. It is not intended to diagnose MRSA infection nor to guide or monitor treatment for MRSA infections. Performed at [facility], [place] [address] [place],  [address]       Radiology Studies: No results found.  Scheduled Meds: . azelastine  1 spray Each Nare BID  . chlorhexidine  15 mL Mouth Rinse BID  . Chlorhexidine Gluconate Cloth  6 each Topical Q0600  . docusate  100 mg Per Tube BID  . furosemide  20 mg Per Tube Once per day on Mon Wed Fri  . hydrALAZINE  50 mg Per Tube Q8H  . insulin aspart  0-9 Units Subcutaneous  Q4H  . insulin glargine  15 Units Subcutaneous Daily  . irbesartan  150 mg Per NG tube Daily  . labetalol  100 mg Per Tube Q6H  . minoxidil  5 mg Per Tube Daily  . simvastatin  20 mg Per Tube Q M,W,F   Continuous Infusions: . sodium chloride 75 mL/hr at 12/01/19 0643  . clevidipine Stopped (11/28/19 1110)  . feeding supplement (OSMOLITE 1.2 CAL) 1,000 mL (11/30/19 1649)  . levETIRAcetam 1,000 mg (12/01/19 1022)  . valproate sodium 250 mg (12/01/19  0551)     LOS: 9 days   Time spent: 30 minutes   Darliss Cheney, MD [facility]  12/01/2019, 1:58 PM   To contact the attending provider between 7A-7P or the covering provider during after hours 7P-7A, please log into the web site [email or web address].

## 2019-12-02 LAB — GLUCOSE, CAPILLARY
Glucose-Capillary: 157 mg/dL — ABNORMAL HIGH (ref 70–99)
Glucose-Capillary: 163 mg/dL — ABNORMAL HIGH (ref 70–99)
Glucose-Capillary: 169 mg/dL — ABNORMAL HIGH (ref 70–99)
Glucose-Capillary: 183 mg/dL — ABNORMAL HIGH (ref 70–99)
Glucose-Capillary: 195 mg/dL — ABNORMAL HIGH (ref 70–99)
Glucose-Capillary: 203 mg/dL — ABNORMAL HIGH (ref 70–99)
Glucose-Capillary: 80 mg/dL (ref 70–99)

## 2019-12-02 LAB — RENAL FUNCTION PANEL
Albumin: 2.3 g/dL — ABNORMAL LOW (ref 3.5–5.0)
Anion gap: 8 (ref 5–15)
BUN: 49 mg/dL — ABNORMAL HIGH (ref 8–23)
CO2: 23 mmol/L (ref 22–32)
Calcium: 8.9 mg/dL (ref 8.9–10.3)
Chloride: 103 mmol/L (ref 98–111)
Creatinine, Ser: 1.03 mg/dL — ABNORMAL HIGH (ref 0.44–1.00)
GFR calc Af Amer: 57 mL/min — ABNORMAL LOW (ref >60)
GFR calc non Af Amer: 50 mL/min — ABNORMAL LOW (ref >60)
Glucose, Bld: 186 mg/dL — ABNORMAL HIGH (ref 70–99)
Phosphorus: 5.6 mg/dL — ABNORMAL HIGH (ref 2.5–4.6)
Potassium: 4.8 mmol/L (ref 3.5–5.1)
Sodium: 134 mmol/L — ABNORMAL LOW (ref 135–145)

## 2019-12-02 MED ORDER — INSULIN GLARGINE 100 UNIT/ML ~~LOC~~ SOLN
20.0000 [IU] | Freq: Every day | SUBCUTANEOUS | Status: DC
  Administered 2019-12-03 – 2019-12-08 (×4): 20 [IU] via SUBCUTANEOUS
  Filled 2019-12-02 (×6): qty 0.2

## 2019-12-02 NOTE — Progress Notes (Addendum)
Pt scheduled for peg placement today. Enteral tube feed placed on hold and stopped at 0102.

## 2019-12-02 NOTE — Progress Notes (Signed)
   Providing Compassionate, Quality Care - Together  NEUROSURGERY PROGRESS NOTE   S: No issues overnight. Still lethargic  O: EXAM:  BP (!) 90/54 (BP Location: Right Arm)   Pulse 67   Temp 98.6 F (37 C) (Axillary)   Resp 20   Ht 5' (1.524 m)   Wt 56.7 kg   SpO2 99%   BMI 24.41 kg/m   Eyes open to pain PERRL Not FCs WD briskly everything to pain [place] to pain  ASSESSMENT:  86 y.o. female with  1. SDH, parafalcine, acute  PLAN: - continue supportive therapy - sz control per neurology - dc planning, d/w family at bedside about the need for peg in order to go to SNF, agree so will order IR consult - DVT ppx    Please do not hesitate to call with questions or concerns.   Elwin Sleight, [facility] Cell: [phone]

## 2019-12-02 NOTE — Progress Notes (Signed)
PROGRESS NOTE    [name]  TTS:[id number] DOB: [birth year] DOA: 11/22/2019 PCP: [name], MD  Please note that hospital service is a consulting service on this patient.  Primary service remains neurosurgery.  Brief Narrative:  History of CAD, hypertension had a fall and was brought to the ER where patient was found to be confused and was throwing up. CT head showed subdural hematoma and patient was admitted for further management. During admission patient also was found to have seizures. Neurology was consulted. Patient was loaded on antiepileptics. Patient was found to have bradycardic episodes.   Assessment & Plan:   Principal Problem:   Bradycardia Active Problems:   Subdural hematoma (HCC)   Protein-calorie malnutrition, severe  1. Sinus bradycardia- Improved . Patient is back into normal heart rhythm. clonidine discontinued and replaced with minoxidil after discussing with cardiology. Patient is allergic to multiple medications. We will continue with ARB. Closely monitoring telemetry. Started labetalol 100 mg every 6 hours,  Successfully weaned her off from Cleviprex.  2. Subdural hematoma:  being managed by neurosurgery/primary team.  3. Seizures:  Controlled,  being managed by neurology.   4. History of CAD. Stable.  5. Chronic kidney disease stage II. Stable.   6. Hypertension: Blood pressure control.  Continue current regimen.  7. Hyperkalemia: Resolved.  8.  Low-grade fever of 100.5 on the morning of 11/30/2019 at 8 AM.  No source of infection identified.  She has remained afebrile since then.  9.  Type 2 diabetes mellitus: Blood sugar still elevated despite of adding 15 units of Lantus yesterday but better than yesterday.  Continue sliding scale insulin.  Increase Lantus to 20 units.  Continue 3 units of NovoLog every 4 hours subcu per diabetes coordinator recommendation.  DVT prophylaxis: SCDs Start: 11/22/19 1958 Place TED hose Start:  11/22/19 1958   Code Status: DNR  Family Communication: None present at bedside.   Status is: Inpatient  Remains inpatient appropriate because:Inpatient level of care appropriate due to severity of illness   Dispo: The patient is from: Home              Anticipated d/c is to: SNF              Anticipated d/c date is: 3 days or at discretion of primary team/neurosurgery.             Estimated body mass index is 24.41 kg/m as calculated from the following:   Height as of this encounter: 5' (1.524 m).   Weight as of this encounter: 56.7 kg.      Nutritional status:  Nutrition Problem: Severe Malnutrition Etiology: chronic illness (CAD, CKD, DM)   Signs/Symptoms: severe fat depletion, severe muscle depletion   Interventions: Tube feeding    Consultants:   Hospitalist and neurology  Procedures:   None  Antimicrobials:  Anti-infectives (From admission, onward)   None         Subjective: Seen and examined.  She is again too lethargic to hold any conversation.  She tried to open her eyes but was having a hard time keeping them open.  Objective: Vitals:   12/02/19 0022 12/02/19 0400 12/02/19 0800 12/02/19 1200  BP: 134/78 131/64 (!) 90/54 135/72  Pulse: 68 90 67 93  Resp: (!) 25 17 20  (!) 24  Temp: 98.4 F (36.9 C) 98.8 F (37.1 C) 98.6 F (37 C) 98.4 F (36.9 C)  TempSrc: Axillary Axillary Axillary Axillary  SpO2: 99% 99% 99% 93%  Weight:      Height:        Intake/Output Summary (Last 24 hours) at 12/02/2019 1254 Last data filed at 12/02/2019 0512 Gross per 24 hour  Intake 3293.4 ml  Output 550 ml  Net 2743.4 ml   Filed Weights   11/22/19 1535 11/30/19 0500 12/01/19 0500  Weight: 47 kg 56.7 kg 56.7 kg    Examination:  General exam: Appears lethargic Respiratory system: Clear to auscultation. Respiratory effort normal. Cardiovascular system: S1 & S2 heard, RRR. No JVD, murmurs, rubs, gallops or clicks. No pedal  edema. Gastrointestinal system: Abdomen is nondistended, soft and nontender. No organomegaly or masses felt. Normal bowel sounds heard.  Data Reviewed: I have personally reviewed following labs and imaging studies  CBC: Recent Labs  Lab 12/01/19 0827  WBC 10.0  NEUTROABS 8.1*  HGB 7.7*  HCT 24.7*  MCV 88.8  PLT 329   Basic Metabolic Panel: Recent Labs  Lab  0000 11/25/19 1501 11/25/19 2325 11/25/19 2325 11/26/19 0621 11/26/19 0621 11/26/19 1655 11/27/19 0616 11/27/19 0616 11/28/19 0218 11/28/19 0218 11/29/19 0609 11/29/19 1157 11/30/19 0800 12/01/19 0827 12/02/19 0550  NA  --   --  147*   < > 149*   < >  --  150*   < > 145  --  143  --  140 136 134*  K  --   --  3.1*   < > 3.4*   < >  --  3.3*   < > 4.7   < > 5.5* 5.9* 4.9 4.9 4.8  CL  --   --  113*   < > 114*   < >  --  114*   < > 113*  --  113*  --  108 104 103  CO2  --   --  23   < > 23   < >  --  22   < > 19*  --  20*  --  22 23 23   GLUCOSE  --   --  99   < > 200*   < >  --  224*   < > 250*  --  206*  --  251* 253* 186*  BUN  --   --  30*   < > 30*   < >  --  28*   < > 39*  --  42*  --  46* 50* 49*  CREATININE  --   --  1.13*   < > 1.09*   < >  --  0.96   < > 1.11*  --  1.09*  --  1.14* 1.09* 1.03*  CALCIUM  --   --  8.8*   < > 9.1   < >  --  9.2   < > 8.6*  --  8.8*  --  9.1 8.6* 8.9  MG  --  1.9 1.8  --  1.8  --  1.8 1.8  --   --   --   --   --   --   --   --   PHOS   < > 2.9  --   --  2.0*   < > 2.4* 3.1   < > 4.9*  --  4.4  --  4.4 4.9* 5.6*   < > = values in this interval not displayed.   GFR: Estimated Creatinine Clearance: 31.5 mL/min (A) (by C-G formula based on SCr of 1.03 mg/dL (H)). Liver Function Tests: Recent Labs  Lab 11/28/19 0218 11/29/19 0609 11/30/19 0800 12/01/19 0827 12/02/19 0550  ALBUMIN 2.3* 2.1* 2.2* 2.1* 2.3*   No results for input(s): LIPASE, AMYLASE in the last 168 hours. No results for input(s): AMMONIA in the last 168 hours. Coagulation Profile: No results for input(s):  INR, PROTIME in the last 168 hours. Cardiac Enzymes: No results for input(s): CKTOTAL, CKMB, CKMBINDEX, TROPONINI in the last 168 hours. BNP (last 3 results) Recent Labs    02/25/19 1339 03/08/19 1422 03/25/19 1554  PROBNP 5,548* 5,028* 3,715*   HbA1C: No results for input(s): HGBA1C in the last 72 hours. CBG: Recent Labs  Lab 12/01/19 2041 12/02/19 0026 12/02/19 0641 12/02/19 0746 12/02/19 1218  GLUCAP 155* 195* 163* 183* 157*   Lipid Profile: No results for input(s): CHOL, HDL, LDLCALC, TRIG, CHOLHDL, LDLDIRECT in the last 72 hours. Thyroid Function Tests: No results for input(s): TSH, T4TOTAL, FREET4, T3FREE, THYROIDAB in the last 72 hours. Anemia Panel: No results for input(s): VITAMINB12, FOLATE, FERRITIN, TIBC, IRON, RETICCTPCT in the last 72 hours. Sepsis Labs: No results for input(s): PROCALCITON, LATICACIDVEN in the last 168 hours.  Recent Results (from the past 240 hour(s))  SARS Coronavirus 2 by RT PCR (hospital order, performed in [facility] hospital lab) Nasopharyngeal Nasopharyngeal Swab     Status: None   Collection Time: 11/22/19  3:29 PM   Specimen: Nasopharyngeal Swab  Result Value Ref Range Status   SARS Coronavirus 2 NEGATIVE NEGATIVE Final    Comment: (NOTE) SARS-CoV-2 target nucleic acids are NOT DETECTED.  The SARS-CoV-2 RNA is generally detectable in upper and lower respiratory specimens during the acute phase of infection. The lowest concentration of SARS-CoV-2 viral copies this assay can detect is 250 copies / mL. A negative result does not preclude SARS-CoV-2 infection and should not be used as the sole basis for treatment or other patient management decisions.  A negative result may occur with improper specimen collection / handling, submission of specimen other than nasopharyngeal swab, presence of viral mutation(s) within the areas targeted by this assay, and inadequate number of viral copies (<250 copies / mL). A negative result must  be combined with clinical observations, patient history, and epidemiological information.  Fact Sheet for Patients:   StrictlyIdeas.no  Fact Sheet for Healthcare Providers: BankingDealers.co.za  This test is not yet approved or  cleared by the [place] FDA and has been authorized for detection and/or diagnosis of SARS-CoV-2 by FDA under an Emergency Use Authorization (EUA).  This EUA will remain in effect (meaning this test can be used) for the duration of the COVID-19 declaration under Section 564(b)(1) of the Act, 21 U.S.C. section 360bbb-3(b)(1), unless the authorization is terminated or revoked sooner.  Performed at [facility], [place] [address] [place], [place] [address]   MRSA PCR Screening     Status: None   Collection Time: 11/22/19  8:58 PM   Specimen: Nasal Mucosa; Nasopharyngeal  Result Value Ref Range Status   MRSA by PCR NEGATIVE NEGATIVE Final    Comment:        The GeneXpert MRSA Assay (FDA approved for NASAL specimens only), is one component of a comprehensive MRSA colonization surveillance program. It is not intended to diagnose MRSA infection nor to guide or monitor treatment for MRSA infections. Performed at [facility], [place] [address], [place], [place] [address]       Radiology Studies: CT ABDOMEN WO CONTRAST  Result Date: 12/02/2019 CLINICAL DATA:  Altered mental status, preop planning  for gastrostomy placement EXAM: CT ABDOMEN WITHOUT CONTRAST TECHNIQUE: Multidetector CT imaging of the abdomen was performed following the standard protocol without IV contrast. COMPARISON:  02/15/2019 FINDINGS: Lower chest: Small bilateral pleural effusions. Dependent consolidation/atelectasis in the visualized lung bases. Mitral annulus calcifications. Blood pool is hypodense compared to the interventricular septum suggesting anemia. Hepatobiliary: No focal liver abnormality is seen. Status post  cholecystectomy. No biliary dilatation. Pancreas: Unremarkable. No pancreatic ductal dilatation or surrounding inflammatory changes. Spleen: Normal in size without focal abnormality. Adrenals/Urinary Tract: Prominent adrenal glands left greater than right with some motion degradation. Stable 1.7 cm exophytic process from the upper pole left kidney, incompletely characterized. No urolithiasis or hydronephrosis. Stomach/Bowel: Feeding tube extends to the gastric antrum. The stomach is incompletely distended. There is a broad window to allow percutaneous gastrostomy placement. Visualized small bowel and colon are nondilated. Vascular/Lymphatic: Heavy aortoiliac atheromatous calcifications without aneurysm. No adenopathy localized. Other: No ascites.  No free air. Musculoskeletal: Mild thoracolumbar dextroscoliosis apex L1. Multilevel lumbar spondylitic change. Previous cement augmentation T12 and L1 vertebral bodies. Compression deformity of T11 with approximately 50% loss of height anteriorly, progressive since 02/15/2019. IMPRESSION: 1. There is a broad window to allow percutaneous gastrostomy placement. 2. Small bilateral pleural effusions with adjacent consolidation/atelectasis in the visualized lung bases. 3. T11 compression deformity, progressive since 02/15/2019. Aortic Atherosclerosis (ICD10-I70.0). Electronically Signed   By: Lucrezia Europe M.D.   On: 12/02/2019 08:21    Scheduled Meds: . azelastine  1 spray Each Nare BID  . chlorhexidine  15 mL Mouth Rinse BID  . Chlorhexidine Gluconate Cloth  6 each Topical Q0600  . docusate  100 mg Per Tube BID  . furosemide  20 mg Per Tube Once per day on Mon Wed Fri  . hydrALAZINE  50 mg Per Tube Q8H  . insulin aspart  0-9 Units Subcutaneous Q4H  . insulin aspart  3 Units Subcutaneous Q4H  . insulin glargine  15 Units Subcutaneous Daily  . irbesartan  150 mg Per NG tube Daily  . labetalol  100 mg Per Tube Q6H  . minoxidil  5 mg Per Tube Daily  . simvastatin   20 mg Per Tube Q M,W,F   Continuous Infusions: . sodium chloride 75 mL/hr at 12/01/19 2230  . clevidipine Stopped (11/28/19 1110)  . feeding supplement (OSMOLITE 1.2 CAL) Stopped (12/02/19 0102)  . levETIRAcetam 1,000 mg (12/02/19 1154)  . valproate sodium 250 mg (12/02/19 0640)     LOS: 10 days   Time spent: 28 minutes   Darliss Cheney, MD [facility]  12/02/2019, 12:54 PM   To contact the attending provider between 7A-7P or the covering provider during after hours 7P-7A, please log into the web site [email or web address].

## 2019-12-02 NOTE — Progress Notes (Signed)
SLP Cancellation Note  Patient Details Name: [name] MRN: [id number] DOB: 1931/10/08   Cancelled treatment:       Reason Eval/Treat Not Completed: Medical issues which prohibited therapy - pt NPO pending PEG placement today. WIll f/u for PO trials as able.   Osie Bond., M.A. [facility] Pager [phone] Office [phone]  12/02/2019, 9:07 AM

## 2019-12-02 NOTE — Progress Notes (Signed)
   Providing Compassionate, Quality Care - Together  NEUROSURGERY PROGRESS NOTE   S: No issues overnight. Planned peg today  O: EXAM:  BP (!) 90/54 (BP Location: Right Arm)   Pulse 67   Temp 98.6 F (37 C) (Axillary)   Resp 20   Ht 5' (1.524 m)   Wt 56.7 kg   SpO2 99%   BMI 24.41 kg/m   Eyes open to voice PERRL Not FCs WD briskly everything to pain Win[place] to pain  ASSESSMENT:  86 y.o. female with  1. SDH, parafalcine, acute 2. Szs  PLAN: - continue supportive therapy - sz control per neurology - dc planning, peg planned today, SNF hopefully monday - DVT ppx   Thank you for allowing me to participate in this patient's care.  Please do not hesitate to call with questions or concerns.   Elwin Sleight, [facility] Cell: [phone]

## 2019-12-02 NOTE — TOC Initial Note (Signed)
Transition of Care [facility]) - Initial/Assessment Note    Patient Details  Name: [name] MRN: [id number] Date of Birth: Sep 18, 1931  [facility] CM/SW Contact:    Emeterio Reeve, [place] Phone Number: 12/02/2019, 12:56 PM  Clinical Narrative:                  CSW met with pt at bedside. CSW introduced self and explained her role at the hospital.  CSW spoke with pts son Reola Mosher. Sherren Mocha stated that [facility]. Sherren Mocha stated that pt used a walker and had an aid a few times a week that would help with showering. Pt was able to do everything else on her own.   CSW reviewed pt reccs of snf. Sherren Mocha stated they are open to SN[facility] [place].   CSW complete FL2 and faxed pt out.   Expected Discharge Plan: Skilled Nursing Facility Barriers to Discharge: Continued Medical Work up   Patient Goals and CMS Choice Patient states their goals for this hospitalization and ongoing recovery are:: pt was unable to express goals CMS Medicare.gov Compare Post Acute Care list provided to:: Patient Choice offered to / list presented to : Patient  Expected Discharge Plan and Services Expected Discharge Plan: [place]       Living arrangements for the past 2 months: [place]                                      Prior Living Arrangements/Services Living arrangements for the past 2 months: [place] Lives with:: Spouse Patient language and need for interpreter reviewed:: Yes Do you feel safe going back to the place where you live?: Yes      Need for Family Participation in Patient Care: Yes (Comment) Care giver support system in place?: Yes (comment) Current home services: DME Criminal Activity/Legal Involvement Pertinent to Current Situation/Hospitalization: No - Comment as needed  Activities of Daily Living      Permission Sought/Granted Permission sought to share  information with : Facility Art therapist granted to share information with : Yes, Verbal Permission Granted     Permission granted to share info w AGENCY: SNF        Emotional Assessment Appearance:: Appears stated age Attitude/Demeanor/Rapport: Unable to Assess Affect (typically observed): Unable to Assess Orientation: : Oriented to Self, Oriented to Place Alcohol / Substance Use: Not Applicable Psych Involvement: No (comment)  Admission diagnosis:  Subdural hemorrhage ([place]) [I62.00] Subdural hematoma ([place]) [B35.3G9J] Patient Active Problem List   Diagnosis Date Noted  . Protein-calorie malnutrition, severe 11/25/2019  . Subdural hematoma ([place]) 11/22/2019  . Gait abnormality 09/06/2019  . Fall at home, initial encounter 02/15/2019  . Breast nodule 02/15/2019  . Memory problem 02/15/2019  . Pain 12/28/2017  . Primary osteoarthritis of left hand 12/28/2017  . TMJ pain dysfunction syndrome 08/26/2016  . Angina at rest [facility]) 06/15/2016  . Hypertensive urgency 06/15/2016  . Cerebrovascular disease 02/27/2016  . Anemia 02/01/2016  . Bradycardia 02/01/2016  . Left renal artery stenosis ([place]) 01/31/2016  . Presbycusis of both ears 01/29/2016  . BPPV (benign paroxysmal positional vertigo) 01/08/2016  . Impacted cerumen of right ear 01/08/2016  . Compression fracture of L1 lumbar vertebra (HCC) 04/23/2015  . T12 compression fracture ([place]) 03/20/2015  . Hyperthyroidism 03/11/2015  . Thyroid nodule 03/11/2015  .  Syncope 03/08/2015  . Hypokalemia 03/08/2015  . CKD (chronic kidney disease), stage III 02/26/2015  . Multiple allergies 02/26/2015  . Hyponatremia 02/04/2015  . DM (diabetes mellitus) ([place]) 12/21/2011  . CAD (coronary artery disease) 12/21/2011  . HTN (hypertension) 12/21/2011  . Hyperlipidemia 12/21/2011  . GERD (gastroesophageal reflux disease) 12/21/2011   PCP:  [name], MD Pharmacy:   [id number] - [address] [place] [address]Phone: [phone]: [phone]    Social Determinants of Health (SDOH) Interventions    Readmission Risk Interventions No flowsheet data found.  MEmeterio Reeve LLatanya Presser LHemby BridgeSocial Worker [phone]

## 2019-12-02 NOTE — NC FL2 (Signed)
[place] LEVEL OF CARE SCREENING TOOL     IDENTIFICATION  Patient Name: [name] Birthdate: [birth year] Sex: female Admission Date (Current Location): 11/22/2019  [facility] and [place] Number:  Herbalist and Address:  [facility] [place], [place] [address] [place], [place] [address]      Provider Number: [id number]  Attending Physician Name and Address:  Judith Part, MD  Relative Name and Phone Number:       Current Level of Care: Hospital Recommended Level of Care: [place] Prior Approval Number:    Date Approved/Denied:   PASRR Number: [id number]  Discharge Plan: SNF    Current Diagnoses: Patient Active Problem List   Diagnosis Date Noted  . Protein-calorie malnutrition, severe 11/25/2019  . Subdural hematoma ([place]) 11/22/2019  . Gait abnormality 09/06/2019  . Fall at home, initial encounter 02/15/2019  . Breast nodule 02/15/2019  . Memory problem 02/15/2019  . Pain 12/28/2017  . Primary osteoarthritis of left hand 12/28/2017  . TMJ pain dysfunction syndrome 08/26/2016  . Angina at rest [facility]) 06/15/2016  . Hypertensive urgency 06/15/2016  . Cerebrovascular disease 02/27/2016  . Anemia 02/01/2016  . Bradycardia 02/01/2016  . Left renal artery stenosis ([place]) 01/31/2016  . Presbycusis of both ears 01/29/2016  . BPPV (benign paroxysmal positional vertigo) 01/08/2016  . Impacted cerumen of right ear 01/08/2016  . Compression fracture of L1 lumbar vertebra ([facility]) 04/23/2015  . T12 compression fracture ([place]) 03/20/2015  . Hyperthyroidism 03/11/2015  . Thyroid nodule 03/11/2015  . Syncope 03/08/2015  . Hypokalemia 03/08/2015  . CKD (chronic kidney disease), stage III 02/26/2015  . Multiple allergies 02/26/2015  . Hyponatremia 02/04/2015  . DM (diabetes mellitus) ([place]) 12/21/2011  . CAD (coronary artery disease) 12/21/2011  . HTN (hypertension) 12/21/2011  . Hyperlipidemia 12/21/2011  . GERD  (gastroesophageal reflux disease) 12/21/2011    Orientation RESPIRATION BLADDER Height & Weight        O2 Incontinent Weight: 125 lb (56.7 kg) Height:  5' (152.4 cm)  BEHAVIORAL SYMPTOMS/MOOD NEUROLOGICAL BOWEL NUTRITION STATUS      Incontinent Diet, Feeding tube (See discharge summary)  AMBULATORY STATUS COMMUNICATION OF NEEDS Skin   Total Care Verbally                         Personal Care Assistance Level of Assistance  Bathing, Feeding, Dressing Bathing Assistance: Maximum assistance Feeding assistance: Maximum assistance Dressing Assistance: Maximum assistance     Functional Limitations Info  Sight, Hearing, Speech Sight Info: Adequate Hearing Info: Adequate Speech Info: Adequate    SPECIAL CARE FACTORS FREQUENCY  PT (By licensed PT), OT (By licensed OT)     PT Frequency: 5x  a week OT Frequency: 5x a week            Contractures Contractures Info: Not present    Additional Factors Info  Code Status, Allergies Code Status Info: DNR Allergies Info: Fentanyl Fish Allergy Morphine And Related Singulair  Actonel Dilaudid Fosamax Hydralazine Lipitor  Lisinopril Septra Tekturna  Tricor Imdur  Amlodipine Avapro Cardio Complete  Cardizem  Ciprofloxacin Codeine Cyclobenzaprine Forteo Inapsine  Ivp Dye Shellfish Allergy Tussionex Pennkinetic Er           Current Medications (12/02/2019):  This is the current hospital active medication list Current Facility-Administered Medications  Medication Dose Route Frequency Provider Last Rate Last Admin  . 0.45 % sodium chloride infusion   Intravenous Continuous Shawna Clamp, MD  75 mL/hr at 12/01/19 2230 New Bag at 12/01/19 2230  . acetaminophen (TYLENOL) tablet 650 mg  650 mg Per Tube Q4H PRN Eleonore Chiquito, NP   650 mg at 11/30/19 0936   Or  . acetaminophen (TYLENOL) suppository 650 mg  650 mg Rectal Q4H PRN Meyran, Ocie Cornfield, NP      . azelastine (ASTELIN) 0.1 % nasal spray 1 spray  1 spray Each Nare  BID Shawna Clamp, MD   1 spray at 12/02/19 1156  . chlorhexidine (PERIDEX) 0.12 % solution 15 mL  15 mL Mouth Rinse BID Judith Part, MD   15 mL at 12/01/19 2232  . Chlorhexidine Gluconate Cloth 2 % PADS 6 each  6 each Topical Q0600 Judith Part, MD   6 each at 12/01/19 1025  . clevidipine (CLEVIPREX) infusion 0.5 mg/mL  0-21 mg/hr Intravenous Continuous Judith Part, MD   Stopped at 11/28/19 1110  . docusate (COLACE) 50 MG/5ML liquid 100 mg  100 mg Per Tube BID Judith Part, MD   100 mg at 12/01/19 2232  . enalaprilat (VASOTEC) injection 0.625 mg  0.625 mg Intravenous Q6H PRN Judith Part, MD   0.625 mg at 11/28/19 1709  . feeding supplement (OSMOLITE 1.2 CAL) liquid 1,000 mL  1,000 mL Per Tube Continuous Judith Part, MD   Held at 12/02/19 0102  . furosemide (LASIX) tablet 20 mg  20 mg Per Tube Once per day on Mon Wed Fri Judith Part, MD   20 mg at 11/30/19 0935  . hydrALAZINE (APRESOLINE) tablet 50 mg  50 mg Per Tube Villa Herb, MD   50 mg at 12/02/19 0640  . insulin aspart (novoLOG) injection 0-9 Units  0-9 Units Subcutaneous Q4H Judith Part, MD   5 Units at 12/02/19 0815  . insulin aspart (novoLOG) injection 3 Units  3 Units Subcutaneous Q4H Darliss Cheney, MD   3 Units at 12/02/19 [phone]  . [START ON 12/03/2019] insulin glargine (LANTUS) injection 20 Units  20 Units Subcutaneous Daily Pahwani, Ravi, MD      . irbesartan (AVAPRO) tablet 150 mg  150 mg Per NG tube Daily Darliss Cheney, MD   150 mg at 12/01/19 1025  . labetalol (NORMODYNE) tablet 100 mg  100 mg Per Tube Q6H Shawna Clamp, MD   100 mg at 12/02/19 0640  . levETIRAcetam (KEPPRA) IVPB 1000 mg/100 mL premix  1,000 mg Intravenous Q12H Judith Part, MD 400 mL/hr at 12/02/19 1154 1,000 mg at 12/02/19 1154  . LORazepam (ATIVAN) injection 2 mg  2 mg Intravenous Q2H PRN Judith Part, MD   2 mg at 11/25/19 0923  . minoxidil (LONITEN) tablet 5 mg  5 mg Per Tube Daily  Rise Patience, MD   5 mg at 12/01/19 1024  . nitroGLYCERIN (NITROSTAT) SL tablet 0.4 mg  0.4 mg Sublingual Q5 min PRN Judith Part, MD      . ondansetron (ZOFRAN) tablet 4 mg  4 mg Oral Q4H PRN Judith Part, MD       Or  . ondansetron (ZOFRAN) injection 4 mg  4 mg Intravenous Q4H PRN Judith Part, MD      . polyethylene glycol (MIRALAX / GLYCOLAX) packet 17 g  17 g Per Tube Daily PRN Shawna Clamp, MD   17 g at 11/27/19 1636  . promethazine (PHENERGAN) tablet 12.5-25 mg  12.5-25 mg Oral Q4H PRN Judith Part, MD      .  simvastatin (ZOCOR) tablet 20 mg  20 mg Per Tube Q M,W,F Judith Part, MD   20 mg at 11/30/19 0935  . valproate (DEPACON) 250 mg in dextrose 5 % 50 mL IVPB  250 mg Intravenous Q8H Bhagat, Srishti L, MD 52.5 mL/hr at 12/02/19 0640 250 mg at 12/02/19 4996     Discharge Medications: Please see discharge summary for a list of discharge medications.  Relevant Imaging Results:  Relevant Lab Results:   Additional Information SSN [id number]  Emeterio Reeve, [place]

## 2019-12-02 NOTE — Consult Note (Addendum)
Chief Complaint: Patient was seen in consultation today for percutaneous gastrostomy placement.  Referring Physician(s): Dawley, Pieter Partridge, DO  Supervising Physician: Jacqulynn Cadet  Patient Status: [facility] - In-pt  History of Present Illness: [name] is a 86 y.o. female with a past medical history significant for BPPV, Meniere disease, GERD, CKD, anemia, DM, HLD, HTN, CAD, MI 07/18/2005) and stroke who presented to the ED on 11/22/19 due to a change in mental status and vomiting. She was found to have a large para falcine subdural hematoma with mild mass effect asymmetrically involving the left cerebral hemisphere with mild effacement of the left lateral ventricle and minimal left-to-right midline shift, no associated calvarial fracture. Neurosurgery was consulted and she was admitted for further evaluation and management. While admitted she was found to have new onset seizures as well as bradycardic episodes. She has been unable to tolerate PO intake and is currently receiving nutrition via NG tube. IR has been asked to place a gastrostomy tube for long term nutrition.  Ms. Yaklin sleeping upon entry to room, she arouses briefly with forceful touch but quickly falls back asleep. She does not follow any commands or respond to any questions. No visitors or staff present at bedside.  Past Medical History:  Diagnosis Date  . BPPV (benign paroxysmal positional vertigo) 01/08/2016  . Bradycardia    a. Coreg decreased due to HR low 40s on event monitor July 19, 2015 -> further decreased due to HR upper 30s in 01/2016.  . Breast nodule 02/15/2019  . CAD (coronary artery disease)    a. RV infarct Jul 18, 2005 c/b high grade AV block with total occlusion of RCA, unable to treat with PCI. b. Nuc 10/2015: scar but no ischemia.  . Carotid artery disease ([place])   . Chronic anemia   . Chronic lower back pain   . CKD (chronic kidney disease), stage III   . Complication of anesthesia    "almost died when I had my breast  reduction"  . Diabetes ([place]) 2000/07/18  . Dyslipidemia   . Gait abnormality 09/06/2019  . GERD (gastroesophageal reflux disease)   . Heart murmur   . High cholesterol   . History of hiatal hernia   . HTN (hypertension)   . Hyperthyroidism   . Memory problem 02/15/2019  . Meniere disease    "for years" takes Antivert 3 times per day (01/31/2016)  . Myocardial infarction [facility]) 07-18-05   "almost died"  . Near syncope   . Osteoporosis   . Presbycusis of both ears 01/29/2016  . Renal artery stenosis ([facility])    a. left renal artery stent 2010-07-19. b. s/p repeat left renal stenting 01/2016.  . Stroke [facility])    a. Prior infarcts seen on head CT.  . TMJ pain dysfunction syndrome 08/26/2016  . Type II diabetes mellitus ([place])    "controlled w/diet and exercise only" (01/31/2016)  . Weakness 02/26/2015    Past Surgical History:  Procedure Laterality Date  . ANTERIOR CERVICAL DECOMP/DISCECTOMY FUSION  02/2002   Archie Endo 08/26/2010  . APPENDECTOMY    . BACK SURGERY    . BIOPSY  07/20/2019   Procedure: BIOPSY;  Surgeon: Otis Brace, MD;  Location: [facility]COPY;  Service: Gastroenterology;;  . BLADDER SUSPENSION  x3  . BREAST LUMPECTOMY  11/2007   breast needle-localized lumpectomy/notes 08/12/2012  . CARDIAC CATHETERIZATION  07/18/05   "couldn't get stents in; I almost died'  . CATARACT EXTRACTION W/ INTRAOCULAR LENS  IMPLANT, BILATERAL Bilateral   . CATARACT EXTRACTION W/  INTRAOCULAR LENS  IMPLANT, BILATERAL Bilateral   . CHOLECYSTECTOMY OPEN    . ESOPHAGOGASTRODUODENOSCOPY (EGD) WITH PROPOFOL N/A 07/20/2019   Procedure: ESOPHAGOGASTRODUODENOSCOPY (EGD) WITH PROPOFOL;  Surgeon: Otis Brace, MD;  Location: [facility];  Service: Gastroenterology;  Laterality: N/A;  . FRACTURE SURGERY    . HIP FRACTURE SURGERY Right 2011   "added rod in my leg and a ball"  . KYPHOPLASTY N/A 03/20/2015   Procedure: THORACIC TWELVE KYPHOPLASTY;  Surgeon: Jovita Gamma, MD;  Location: [place] NEURO ORS;  Service:  Neurosurgery;  Laterality: N/A;  . KYPHOPLASTY N/A 04/23/2015   Procedure: KYPHOPLASTY - LUMBAR ONE;  Surgeon: Jovita Gamma, MD;  Location: [place] NEURO ORS;  Service: Neurosurgery;  Laterality: N/A;  L1 Kyphoplasty  . NECK HARDWARE REMOVAL  11/2002   "replaced screw"  . PERIPHERAL VASCULAR CATHETERIZATION Left 12/20/2014   Procedure: Renal Angiography;  Surgeon: Wellington Hampshire, MD;  Location: [place] CV LAB;  Service: Cardiovascular;  Laterality: Left;  . PERIPHERAL VASCULAR CATHETERIZATION N/A 01/31/2016   Procedure: Renal Angiography;  Surgeon: Lorretta Harp, MD;  Location: [place] CV LAB;  Service: Cardiovascular;  Laterality: N/A;  . PERIPHERAL VASCULAR CATHETERIZATION N/A 01/31/2016   Procedure: Abdominal Aortogram;  Surgeon: Lorretta Harp, MD;  Location: [place] CV LAB;  Service: Cardiovascular;  Laterality: N/A;  . PERIPHERAL VASCULAR CATHETERIZATION  01/31/2016   Procedure: Peripheral Vascular Intervention;  Surgeon: Lorretta Harp, MD;  Location: [place] CV LAB;  Service: Cardiovascular;;  . REDUCTION MAMMAPLASTY Bilateral 1985  . renal arteriogram  12/2014   occluded previous Lt renal stent and patent rt renal artery.  [name] RENAL ARTERY STENT Left 2012  . TONSILLECTOMY    . TUBAL LIGATION    . VAGINAL HYSTERECTOMY  ~ 1995  . WRIST FRACTURE SURGERY Left 2012    Allergies: Fentanyl, Fish allergy, Morphine and related, Singulair [montelukast], Actonel [risedronate sodium], Dilaudid [hydromorphone hcl], Fosamax [alendronate sodium], Hydralazine, Lipitor [atorvastatin], Lisinopril, Septra [sulfamethoxazole-trimethoprim], Tekturna [aliskiren], Tricor [fenofibrate], Imdur [isosorbide dinitrate], Amlodipine, Avapro [irbesartan], Cardio complete [nutritional supplements], Cardizem [diltiazem hcl], Ciprofloxacin, Codeine, Cyclobenzaprine, Forteo [parathyroid hormone (recomb)], Inapsine [droperidol], Ivp dye [iodinated diagnostic agents], Shellfish allergy, and Tussionex  pennkinetic er [hydrocod polst-cpm polst er]  Medications: Prior to Admission medications   Medication Sig Start Date End Date Taking? Authorizing Provider  acetaminophen (TYLENOL) 500 MG tablet Take 1,000 mg by mouth every 8 (eight) hours as needed for mild pain (for pain.).    Yes [provider]  Ascorbic Acid (VITAMIN C) 500 MG CHEW Chew 1 tablet by mouth daily.   Yes [provider]  aspirin 81 MG tablet Take 1 tablet (81 mg total) by mouth daily. 02/01/16  Yes Dunn, Dayna N, PA-C  azelastine (ASTELIN) 0.1 % nasal spray Place 1-2 sprays into both nostrils daily as needed for rhinitis or allergies.  12/14/15  Yes [provider]  betamethasone dipropionate (DIPROLENE) 0.05 % cream Apply 1 application topically daily as needed (skin spots).  12/10/18  Yes [provider]  cholecalciferol (VITAMIN D) 1000 UNITS tablet Take 2,000 Units by mouth daily.    Yes [provider]  cloNIDine (CATAPRES) 0.1 MG tablet TAKE TWO TABLETS BY MOUTH IN THE MORNING, ONE TABLET AT 4 PM AND TWO TABLETS AT 10 PM DAILY Patient taking differently: Take 0.1-0.2 mg by mouth See admin instructions. Take two tablets by mouth in the morning, one tablet at 4 PM and two tablets at 10 PM daily 04/28/19  Yes Richardson Dopp T, PA-C  clopidogrel (PLAVIX) 75 MG tablet Take 1 tablet (75 mg total) by mouth daily. Please make overdue appt for future refills. [phone]. 2nd attempt. 04/20/18  Yes Belva Crome, MD  denosumab (PROLIA) 60 MG/ML SOLN injection Inject 60 mg into the skin every 6 (six) months. Administer in upper arm, thigh, or abdomen   Yes [provider]  docusate sodium (COLACE) 100 MG capsule Take 200 mg by mouth at bedtime as needed for mild constipation.    Yes [provider]  ferrous sulfate (FERROUSUL) 325 (65 FE) MG tablet Take 1 tablet (325 mg total) by mouth as directed. Three times weekly with food 08/22/19  Yes [name], MD  fexofenadine  (ALLEGRA) 180 MG tablet Take 180 mg by mouth daily.   Yes [provider]  furosemide (LASIX) 20 MG tablet Take 1 tablet (20 mg total) by mouth 3 (three) times a week. Mon, Wed, and Fri only. 02/28/19 11/22/19 Yes Weaver, Scott T, PA-C  meclizine (ANTIVERT) 25 MG tablet Take 12.5 mg by mouth as needed for dizziness.   Yes [provider]  metFORMIN (GLUCOPHAGE-XR) 500 MG 24 hr tablet Take 500 mg by mouth daily with supper.  11/28/18  Yes [provider]  methimazole (TAPAZOLE) 5 MG tablet Take 5 mg by mouth daily. 08/27/15  Yes [provider]  nitroGLYCERIN (NITROSTAT) 0.4 MG SL tablet Place 0.4 mg under the tongue every 5 (five) minutes as needed for chest pain (3 doses MAX).   Yes [provider]  pantoprazole (PROTONIX) 40 MG tablet Take 1 tablet (40 mg total) by mouth daily. 07/20/19  Yes Brahmbhatt, Parag, MD  Polyethyl Glycol-Propyl Glycol (SYSTANE FREE OP) Place 2 drops into both eyes at bedtime as needed (dry eyes).    Yes [provider]  Probiotic Product (PROBIOTIC PO) Take 1 tablet by mouth daily.   Yes [provider]  simvastatin (ZOCOR) 20 MG tablet Take 20 mg by mouth every Monday, Wednesday, and Friday.    Yes [provider]  telmisartan (MICARDIS) 40 MG tablet Take 20 mg by mouth 2 (two) times daily.   Yes [provider]  OneTouch Delica Lancets 88C MISC Use to test blood sugar twice daily. Dx: E11.9 07/08/19   [name], MD  [facility] ULTRA test strip Use to test blood sugar twice daily. Dx: E11.9 07/08/19   [name], MD  PHENAZOPYRIDINE HCL PO Take 99.5 mg by mouth 6 (six) times daily.    [provider]     Family History  Problem Relation Age of Onset  . Diabetes type II Mother   . Heart attack Mother   . Heart disease Mother   . Stroke Mother   . Congestive Heart Failure Father   . Pulmonary embolism Father   . Alzheimer's disease Sister   . Heart disease Sister   . Heart  disease Brother   . Healthy Sister   . Healthy Sister   . Stroke Brother   . Dementia Brother        VASCULAR  . Stroke Brother   . Diabetes type II Brother   . Other Brother        KILLED IN PLANE CRASH  . Pulmonary disease Sister   . Heart attack Sister   . Kidney failure Sister   . Diabetes type II Sister   . Diabetes type II Sister   . Kidney failure Sister     Social History   Socioeconomic History  .  Marital status: Married    Spouse name: Not on file  . Number of children: 3  . Years of education: college  . Highest education level: Not on file  Occupational History  . Occupation: Retired Therapist, sports  Tobacco Use  . Smoking status: Former Smoker    Packs/day: 1.00    Years: 30.00    Pack years: 30.00    Types: Cigarettes  . Smokeless tobacco: Never Used  . Tobacco comment: "quit smoking in 1989"  Vaping Use  . Vaping Use: Never used  Substance and Sexual Activity  . Alcohol use: No    Alcohol/week: 0.0 standard drinks  . Drug use: No  . Sexual activity: Not on file  Other Topics Concern  . Not on file  Social History Narrative   She and her husband live at [facility].   Right-handed.   No daily caffeine use.   Social Determinants of Health   Financial Resource Strain:   . Difficulty of Paying Living Expenses: Not on file  Food Insecurity:   . Worried About Charity fundraiser in the Last Year: Not on file  . Ran Out of Food in the Last Year: Not on file  Transportation Needs:   . Lack of Transportation (Medical): Not on file  . Lack of Transportation (Non-Medical): Not on file  Physical Activity:   . Days of Exercise per Week: Not on file  . Minutes of Exercise per Session: Not on file  Stress:   . Feeling of Stress : Not on file  Social Connections:   . Frequency of Communication with Friends and Family: Not on file  . Frequency of Social Gatherings with Friends and Family: Not on file  . Attends Religious Services: Not on file  . Active  Member of Clubs or Organizations: Not on file  . Attends Archivist Meetings: Not on file  . Marital Status: Not on file     Review of Systems: A 12 point ROS discussed and pertinent positives are indicated in the HPI above.  All other systems are negative.  Review of Systems  Unable to perform ROS: Mental status change    Vital Signs: BP 135/72 (BP Location: Right Arm)   Pulse 93   Temp 98.4 F (36.9 C) (Axillary)   Resp (!) 24   Ht 5' (1.524 m)   Wt 125 lb (56.7 kg)   SpO2 93%   BMI 24.41 kg/m   Physical Exam Vitals reviewed.  Constitutional:      Comments: Somnolent, arouses briefly to touch but quickly returns to sleep. No visitors or staff at bedside.  HENT:     Head: Normocephalic.     Mouth/Throat:     Mouth: Mucous membranes are moist.     Pharynx: Oropharynx is clear. No oropharyngeal exudate or posterior oropharyngeal erythema.  Cardiovascular:     Rate and Rhythm: Normal rate and regular rhythm.  Pulmonary:     Effort: Pulmonary effort is normal.     Breath sounds: Normal breath sounds.     Comments: (+) 2L O2 via Star Abdominal:     General: There is no distension.     Palpations: Abdomen is soft.     Tenderness: There is no abdominal tenderness.     Comments: (+) NG, tube feeds infusing  Skin:    General: Skin is warm and dry.  Neurological:     Mental Status: Mental status is at baseline.      MD  Evaluation Airway: WNL Heart: WNL Abdomen: WNL Chest/ Lungs: WNL ASA  Classification: 3 Mallampati/Airway Score: Two   Imaging: CT ABDOMEN WO CONTRAST  Result Date: 12/02/2019 CLINICAL DATA:  Altered mental status, preop planning for gastrostomy placement EXAM: CT ABDOMEN WITHOUT CONTRAST TECHNIQUE: Multidetector CT imaging of the abdomen was performed following the standard protocol without IV contrast. COMPARISON:  02/15/2019 FINDINGS: Lower chest: Small bilateral pleural effusions. Dependent consolidation/atelectasis in the visualized  lung bases. Mitral annulus calcifications. Blood pool is hypodense compared to the interventricular septum suggesting anemia. Hepatobiliary: No focal liver abnormality is seen. Status post cholecystectomy. No biliary dilatation. Pancreas: Unremarkable. No pancreatic ductal dilatation or surrounding inflammatory changes. Spleen: Normal in size without focal abnormality. Adrenals/Urinary Tract: Prominent adrenal glands left greater than right with some motion degradation. Stable 1.7 cm exophytic process from the upper pole left kidney, incompletely characterized. No urolithiasis or hydronephrosis. Stomach/Bowel: Feeding tube extends to the gastric antrum. The stomach is incompletely distended. There is a broad window to allow percutaneous gastrostomy placement. Visualized small bowel and colon are nondilated. Vascular/Lymphatic: Heavy aortoiliac atheromatous calcifications without aneurysm. No adenopathy localized. Other: No ascites.  No free air. Musculoskeletal: Mild thoracolumbar dextroscoliosis apex L1. Multilevel lumbar spondylitic change. Previous cement augmentation T12 and L1 vertebral bodies. Compression deformity of T11 with approximately 50% loss of height anteriorly, progressive since 02/15/2019. IMPRESSION: 1. There is a broad window to allow percutaneous gastrostomy placement. 2. Small bilateral pleural effusions with adjacent consolidation/atelectasis in the visualized lung bases. 3. T11 compression deformity, progressive since 02/15/2019. Aortic Atherosclerosis (ICD10-I70.0). Electronically Signed   By: Lucrezia Europe M.D.   On: 12/02/2019 08:21   CT HEAD WO CONTRAST  Result Date: 11/23/2019 CLINICAL DATA:  Subdural hemorrhage, follow-up. EXAM: CT HEAD WITHOUT CONTRAST TECHNIQUE: Contiguous axial images were obtained from the base of the skull through the vertex without intravenous contrast. COMPARISON:  CT 11/22/2019. FINDINGS: Brain: Mildly motion degraded examination. Again demonstrated are acute  bilateral subdural hematomas. As before, parafalcine subdural hematoma measures up to 2.6 cm in thickness (series 5, image 22). However, new from the prior examination there is a 10 mm lobular extension of extra-axial hemorrhage in the left anterior parafalcine region (series 3, image 19) (series 5, image 21). Parafalcine subdural hemorrhage more posteriorly and layering along the left greater than right tentorium is unchanged. Subdural hematoma overlying the anterior left cerebral hemisphere is unchanged again measuring up to 7 mm in greatest thickness. Subdural hematoma overlying the anterior right cerebral hemisphere is unchanged, again measuring up to 4 mm. Unchanged asymmetric mass effect upon the left cerebral hemisphere with partial effacement of the left lateral ventricle and 4 mm rightward midline shift. The basal cisterns remain patent. Stable background generalized parenchymal atrophy and chronic ischemic changes. No demarcated cortical infarct is identified. Vascular: No hyperdense vessel.  Atherosclerotic calcifications Skull: Normal. Negative for fracture or focal lesion. Sinuses/Orbits: Visualized orbits show no acute finding. Mild ethmoid sinus mucosal thickening. Tiny right maxillary sinus mucous retention cyst. No significant mastoid effusion. IMPRESSION: Mildly motion degraded examination. Parafalcine subdural hematoma has largely remained stable as compared to the head CT of 11/22/2019, again measuring up to 2.6 cm in thickness anteriorly. However, there is a new 10 mm lobular extension of extra-axial hemorrhage in the left anterior parafalcine region. Subdural hematoma along the bilateral tentorium and left greater than right anterior cerebral hemispheres is unchanged. As before, there is asymmetric mass effect upon the left cerebral hemisphere with partial effacement of the left lateral ventricle and 4  mm rightward midline shift. Stable background generalized parenchymal atrophy and chronic  ischemic changes. Electronically Signed   By: Kellie Simmering DO   On: 11/23/2019 09:55   CT HEAD WO CONTRAST  Result Date: 11/22/2019 CLINICAL DATA:  Altered mental status, hypertension, vomiting EXAM: CT HEAD WITHOUT CONTRAST TECHNIQUE: Contiguous axial images were obtained from the base of the skull through the vertex without intravenous contrast. COMPARISON:  04/07/2019 FINDINGS: Brain: Since the prior examination, there has developed a large para falcine subdural hematoma prior dominantly within the midline, but seen layering along the tentorium bilaterally, left greater than right, as well as anteriorly along the cerebral hemispheres, left greater than right. The subdural hematoma demonstrates asymmetric mass effect upon the left cerebral hemisphere with asymmetric sulcal effacement and effacement of the left lateral ventricle. 4 mm left-to-right midline shift. The subdural hematoma measures roughly 26 mm in thickness (axial image # 23/3 and coronal image # 21/5). No associated subarachnoid, intraparenchymal, or intraventricular hemorrhage is clearly identified. The suprasellar cistern is preserved; no evidence of uncal herniation. Extensive periventricular and subcortical white matter changes are present likely reflecting the sequela of small vessel ischemia. Remote lacunar infarct noted within the left putamen and right subinsular white matter probable tiny right occipital infarct is unchanged from prior examination. Remote left cerebellar hemispheric lacunar infarct. Vascular: Unremarkable Skull: Intact Sinuses/Orbits: Paranasal sinuses are clear. Orbits are unremarkable. Other: Mastoid air cells and middle ear cavities are clear. IMPRESSION: Large, largely para falcine subdural hematoma with mild mass effect asymmetrically involving the left cerebral hemisphere with mild effacement of the left lateral ventricle and minimal left-to-right midline shift. No associated calvarial fracture. These results were  called by telephone at the time of interpretation on 11/22/2019 at 3:25 pm to provider RACHEL LITTLE , who verbally acknowledged these results. Electronically Signed   By: Fidela Salisbury MD   On: 11/22/2019 15:31   DG CHEST PORT 1 VIEW  Result Date: 11/28/2019 CLINICAL DATA:  Difficulty breathing. EXAM: PORTABLE CHEST 1 VIEW COMPARISON:  Chest x-ray dated September 19, 2019. FINDINGS: Feeding tube entering the stomach with the tip below the field of view. Unchanged borderline cardiomegaly. Atherosclerotic calcification of the aortic arch. Normal pulmonary vascularity. No focal consolidation, pleural effusion, or pneumothorax. No acute osseous abnormality. IMPRESSION: No active disease. Electronically Signed   By: Titus Dubin M.D.   On: 11/28/2019 14:20   EEG adult  Result Date: 11/23/2019 Lora Havens, MD     11/23/2019  1:08 PM Patient Name: LAVERA VANDERMEER MRN: [id number] Epilepsy Attending: Lora Havens Referring Physician/Provider: Dr. Deatra Ina Date: 11/23/2019 Duration: 23.33 mins Patient history: 86 year old female with bilateral parafalcine subdural hemorrhages.  EEG evaluate for seizures. Level of alertness: lethargic AEDs during EEG study: None Technical aspects: This EEG study was done with scalp electrodes positioned according to the 10-20 International system of electrode placement. Electrical activity was acquired at a sampling rate of 500Hz  and reviewed with a high frequency filter of 70Hz  and a low frequency filter of 1Hz . EEG data were recorded continuously and digitally stored. Description:  EEG showed continuous generalized and lateralized left hemisphere 3 to 6 Hz theta-delta slowing. Hyperventilation and photic stimulation were not performed.   ABNORMALITY -Continuous slow, generalized and lateralized left hemisphere IMPRESSION: This study is suggestive of cortical dysfunction in left hemisphere likely secondary to underlying structural abnormality. Additionally, there is  evidence of severe diffuse encephalopathy, non specific to etiology. No seizures or epileptiform discharges were seen throughout the recording.  Priyanka [name]   Overnight EEG with video  Result Date: 11/24/2019 Lora Havens, MD     11/25/2019  5:25 AM Patient Name: [name] MRN: [id number] Epilepsy Attending: Lora Havens Referring Physician/Provider: Dr. Deatra Ina Duration: 11/23/2019 1157 to 11/24/2019 1157  Patient history: 86 year old female with bilateral parafalcine subdural hemorrhages.  EEG evaluate for seizures.  Level of alertness: lethargic  AEDs during EEG study: None  Technical aspects: This EEG study was done with scalp electrodes positioned according to the 10-20 International system of electrode placement. Electrical activity was acquired at a sampling rate of 500Hz  and reviewed with a high frequency filter of 70Hz  and a low frequency filter of 1Hz . EEG data were recorded continuously and digitally stored.  Description:  EEG showed continuous generalized and lateralized left hemisphere 3 to 6 Hz theta-delta slowing.  Periodic lateralized epileptiform discharges were also seen in left hemisphere at 1 to 1.5 Hz.  Event button was pressed on 11/24/2019 at 833.  Around 829, patient was being examined by the physician but was noted to be unable to answer any questions or follow commands.  Gradually she was noted to have right mouth deviation, chewing movements, repetitive blinking and possible right gaze deviation with subtle bilateral upper extremity twitching (bilateral lower extremity were difficult to visualize on camera) which then evolved into bilateral lower extremity tonic extension.  This was followed by clonic movements of bilateral upper and lower extremity (right more than left).  Concomitant EEG initially showed 5 to 6 Hz theta slowing admixed with sharp waves in left frontotemporal region (max F7/T7) which gradually evolved to involve the right hemisphere.  After  the seizure ended patient continued to have periodic lateralized epileptiform discharges at 1.5 to 2 Hz in left hemisphere. Event button was again pressed on 11/24/2019 at 1046. At the beginning of seizure at 1043,  there were no clinical signs. Around 1044, patient was noted to have right facial/mouth twitching,  Concomitant EEG initially showed 5 to 6 Hz theta slowing admixed with sharp waves in left frontotemporal region (max F7/T7) which gradually evolved to involve the right hemisphere.  After the seizure ended patient continued to have periodic lateralized epileptiform discharges at 1.5 to 2 Hz in left hemisphere. Multiple subclinical seizures were seen throughout the study arising from left frontotemporal region, average 1/h, lasting about about 30 seconds.  ABNORMALITY -Seizures, left frontotemporal region -Periodic lateralized epileptiform discharges, left hemisphere -Continuous slow, generalized and lateralized left hemisphere  IMPRESSION: This study showed multiple seizures arising from left frontotemporal region as described above.  There is also evidence of epileptogenicity as well as cortical dysfunction in left hemisphere likely secondary to underlying structural abnormality.  Additionally, there is evidence of severe diffuse encephalopathy, non specific to etiology. Dr Zada Finders was notified.  Priyanka [name]    Labs:  CBC: Recent Labs    09/19/19 1439 11/22/19 1406 11/23/19 0808 12/01/19 0827  WBC 7.9 13.0* 15.0* 10.0  HGB 9.4* 11.2* 11.4* 7.7*  HCT 31.4* 35.3* 35.2* 24.7*  PLT 374 323 296 262    COAGS: Recent Labs    02/15/19 1313 11/22/19 1406  INR 1.0 0.9  APTT  --  29    BMP: Recent Labs    11/29/19 0609 11/29/19 0609 11/29/19 1157 11/30/19 0800 12/01/19 0827 12/02/19 0550  NA 143  --   --  140 136 134*  K 5.5*   < > 5.9* 4.9 4.9 4.8  CL 113*  --   --  108 104 103  CO2 20*  --   --  22 23 23   GLUCOSE 206*  --   --  251* 253* 186*  BUN 42*  --   --   46* 50* 49*  CALCIUM 8.8*  --   --  9.1 8.6* 8.9  CREATININE 1.09*  --   --  1.14* 1.09* 1.03*  GFRNONAA 46*  --   --  44* 46* 50*  GFRAA 54*  --   --  51* 54* 57*   < > = values in this interval not displayed.    LIVER FUNCTION TESTS: Recent Labs    02/15/19 1313 02/15/19 1313 02/16/19 0500 02/16/19 0500 04/29/19 1415 08/17/19 1110 11/22/19 1406 11/25/19 1044 11/29/19 0609 11/30/19 0800 12/01/19 0827 12/02/19 0550  BILITOT 0.9   < > 0.7  --  0.3 0.3 0.5  --   --   --   --   --   AST 23   < > 19  --  16 17 21   --   --   --   --   --   ALT 17   < > 15  --  12 14 14   --   --   --   --   --   ALKPHOS 68  --  67  --   --   --  57  --   --   --   --   --   PROT 6.5   < > 5.7*  --  6.6 6.4 7.3  --   --   --   --   --   ALBUMIN 3.6   < > 3.1*   < >  --   --  3.8   < > 2.1* 2.2* 2.1* 2.3*   < > = values in this interval not displayed.    TUMOR MARKERS: No results for input(s): AFPTM, CEA, CA199, CHROMGRNA in the last 8760 hours.  Assessment and Plan:  86 y/o F admitted for new mental status change following SDH, new onset seizures - unable to tolerate PO intake and currently receiving nutrition via NG tube. IR has been asked to place a gastrostomy tube for long term nutrition needs.   Patient has been approved for procedure - due to IR scheduling will plan on proceeding placement on Tuesday 8/24. Orders placed for tube feeds to be held at midnight on 8/24, AM labs pending, IR will call for patient when ready.  ADDENDUM 1625:  Discussed procedure indication, risks, benefits and alternatives with patient's son Jerrie Schussler via phone today who gives verbal consent to proceed - consent form in IR control room.   Thank you for this interesting consult.  I greatly enjoyed meeting SULA FETTERLY and look forward to participating in their care.  A copy of this report was sent to the requesting provider on this date.  Electronically Signed: Joaquim Nam, PA-C 12/02/2019, 2:31  PM   I spent a total of 40 Minutes  in face to face in clinical consultation, greater than 50% of which was counseling/coordinating care for gastrostomy placement.

## 2019-12-03 LAB — GLUCOSE, CAPILLARY
Glucose-Capillary: 131 mg/dL — ABNORMAL HIGH (ref 70–99)
Glucose-Capillary: 199 mg/dL — ABNORMAL HIGH (ref 70–99)
Glucose-Capillary: 245 mg/dL — ABNORMAL HIGH (ref 70–99)
Glucose-Capillary: 212 mg/dL — ABNORMAL HIGH (ref 70–99)
Glucose-Capillary: 234 mg/dL — ABNORMAL HIGH (ref 70–99)

## 2019-12-03 LAB — RENAL FUNCTION PANEL
Albumin: 2 g/dL — ABNORMAL LOW (ref 3.5–5.0)
Anion gap: 10 (ref 5–15)
BUN: 47 mg/dL — ABNORMAL HIGH (ref 8–23)
CO2: 25 mmol/L (ref 22–32)
Calcium: 8.9 mg/dL (ref 8.9–10.3)
Chloride: 104 mmol/L (ref 98–111)
Creatinine, Ser: 1.08 mg/dL — ABNORMAL HIGH (ref 0.44–1.00)
GFR calc Af Amer: 54 mL/min — ABNORMAL LOW (ref >60)
GFR calc non Af Amer: 47 mL/min — ABNORMAL LOW (ref >60)
Glucose, Bld: 161 mg/dL — ABNORMAL HIGH (ref 70–99)
Phosphorus: 5.3 mg/dL — ABNORMAL HIGH (ref 2.5–4.6)
Potassium: 4.6 mmol/L (ref 3.5–5.1)
Sodium: 139 mmol/L (ref 135–145)

## 2019-12-03 NOTE — Progress Notes (Signed)
Pt is showing ST elevation in the V lead. Pt in bed with eyes closed. Appears to be resting in no distress. Pt responds to pain.  On call provider notified. No new orders received.

## 2019-12-03 NOTE — Progress Notes (Signed)
PROGRESS NOTE    [name]  IHK:[id number] DOB: [birth year] DOA: 11/22/2019 PCP: [name], MD  Please note that hospital service is a consulting service on this patient.  Primary service remains neurosurgery.  Brief Narrative:  History of CAD, hypertension had a fall and was brought to the ER where patient was found to be confused and was throwing up. CT head showed subdural hematoma and patient was admitted for further management. During admission patient also was found to have seizures. Neurology was consulted. Patient was loaded on antiepileptics. Patient was found to have bradycardic episodes.   Assessment & Plan:   Principal Problem:   Bradycardia Active Problems:   Subdural hematoma (HCC)   Protein-calorie malnutrition, severe  1. Sinus bradycardia- Improved . Patient is back into normal heart rhythm. clonidine discontinued and replaced with minoxidil after discussing with cardiology. Patient is allergic to multiple medications. We will continue with ARB. Closely monitoring telemetry. Started labetalol 100 mg every 6 hours,  Successfully weaned her off from Cleviprex.  2. Subdural hematoma:  being managed by neurosurgery/primary team.  3. Seizures:  Controlled,  being managed by neurology.   4. History of CAD. Stable.  5. Chronic kidney disease stage II. Stable.   6. Hypertension: Blood pressure control.  Continue current regimen.  7. Hyperkalemia: Resolved.  8.  Low-grade fever of 100.5 on the morning of 11/30/2019 at 8 AM.  No source of infection identified.  She has remained afebrile since then.  9.  Type 2 diabetes mellitus: Blood sugar slightly greater.  Increase Lantus to 20 units.  Continue 3 units of NovoLog every 4 hours subcu per diabetes coordinator recommendation.  Diabetes coordinator was on board but not since last few days.  We will reconsult them to recommend modifications to primary team.  10.  Lethargy: Patient has been too  lethargic to open her eyes and have any conversation since last few days.  Received message from the nurse yesterday and day before yesterday that family is requesting update.  Defer them to reach out to the primary team.  Patient is to have PEG tube placement however patient seems to have poor prognosis from here and I highly recommend involving/consulting palliative care.  Will defer to primary team about that.  Hospitalist service were consulted initially to manage bradycardia which has been stable since last several days and her blood pressure has been stable as well.  Diabetes coordinator to guide primary service for diabetes.  We will sign off at this point in time.  Please reconsult if our services are required.  DVT prophylaxis: SCDs Start: 11/22/19 1958 Place TED hose Start: 11/22/19 1958   Code Status: DNR  Family Communication: None present at bedside.   Status is: Inpatient  Remains inpatient appropriate because:Inpatient level of care appropriate due to severity of illness   Dispo: The patient is from: Home              Anticipated d/c is to: SNF              Anticipated d/c date is: 3 days or at discretion of primary team/neurosurgery.             Estimated body mass index is 25.79 kg/m as calculated from the following:   Height as of this encounter: 5' (1.524 m).   Weight as of this encounter: 59.9 kg.      Nutritional status:  Nutrition Problem: Severe Malnutrition Etiology: chronic illness (CAD, CKD, DM)   Signs/Symptoms:  severe fat depletion, severe muscle depletion   Interventions: Tube feeding    Consultants:   Hospitalist and neurology  Procedures:   None  Antimicrobials:  Anti-infectives (From admission, onward)   None         Subjective: Seen and examined.  Once again she remains too lethargic to open her eyes.  Objective: Vitals:   12/02/19 2308 12/03/19 0310 12/03/19 0500 12/03/19 0745  BP: (!) 126/59 (!) 117/59  111/72    Pulse: 79 85  92  Resp: 20 16  18   Temp: 99.2 F (37.3 C) 98.1 F (36.7 C)  98.9 F (37.2 C)  TempSrc: Oral Axillary  Oral  SpO2: 100% 100%  100%  Weight:   59.9 kg   Height:        Intake/Output Summary (Last 24 hours) at 12/03/2019 1125 Last data filed at 12/03/2019 0300 Gross per 24 hour  Intake --  Output 300 ml  Net -300 ml   Filed Weights   11/30/19 0500 12/01/19 0500 12/03/19 0500  Weight: 56.7 kg 56.7 kg 59.9 kg    Examination:  General exam: Densely lethargic Respiratory system: Clear to auscultation. Respiratory effort normal. Cardiovascular system: S1 & S2 heard, RRR. No JVD, murmurs, rubs, gallops or clicks. No pedal edema. Gastrointestinal system: Abdomen is nondistended, soft and nontender. No organomegaly or masses felt. Normal bowel sounds heard.  Data Reviewed: I have personally reviewed following labs and imaging studies  CBC: Recent Labs  Lab 12/01/19 0827  WBC 10.0  NEUTROABS 8.1*  HGB 7.7*  HCT 24.7*  MCV 88.8  PLT 601   Basic Metabolic Panel: Recent Labs  Lab 11/26/19 1655 11/26/19 1655 11/27/19 0616 11/28/19 0218 11/29/19 0609 11/29/19 0609 11/29/19 1157 11/30/19 0800 12/01/19 0827 12/02/19 0550 12/03/19 0451  NA  --   --  150*   < > 143  --   --  140 136 134* 139  K  --   --  3.3*   < > 5.5*   < > 5.9* 4.9 4.9 4.8 4.6  CL  --   --  114*   < > 113*  --   --  108 104 103 104  CO2  --   --  22   < > 20*  --   --  22 23 23 25   GLUCOSE  --   --  224*   < > 206*  --   --  251* 253* 186* 161*  BUN  --   --  28*   < > 42*  --   --  46* 50* 49* 47*  CREATININE  --   --  0.96   < > 1.09*  --   --  1.14* 1.09* 1.03* 1.08*  CALCIUM  --   --  9.2   < > 8.8*  --   --  9.1 8.6* 8.9 8.9  MG 1.8  --  1.8  --   --   --   --   --   --   --   --   PHOS 2.4*   < > 3.1   < > 4.4  --   --  4.4 4.9* 5.6* 5.3*   < > = values in this interval not displayed.   GFR: Estimated Creatinine Clearance: 30.8 mL/min (A) (by C-G formula based on SCr of 1.08  mg/dL (H)). Liver Function Tests: Recent Labs  Lab 11/29/19 0609 11/30/19 0800 12/01/19 0827 12/02/19 0550 12/03/19 0451  ALBUMIN  2.1* 2.2* 2.1* 2.3* 2.0*   No results for input(s): LIPASE, AMYLASE in the last 168 hours. No results for input(s): AMMONIA in the last 168 hours. Coagulation Profile: No results for input(s): INR, PROTIME in the last 168 hours. Cardiac Enzymes: No results for input(s): CKTOTAL, CKMB, CKMBINDEX, TROPONINI in the last 168 hours. BNP (last 3 results) Recent Labs    02/25/19 1339 03/08/19 1422 03/25/19 1554  PROBNP 5,548* 5,028* 3,715*   HbA1C: No results for input(s): HGBA1C in the last 72 hours. CBG: Recent Labs  Lab 12/02/19 1632 12/02/19 2019 12/02/19 2310 12/03/19 0315 12/03/19 0745  GLUCAP 80 169* 203* 131* 199*   Lipid Profile: No results for input(s): CHOL, HDL, LDLCALC, TRIG, CHOLHDL, LDLDIRECT in the last 72 hours. Thyroid Function Tests: No results for input(s): TSH, T4TOTAL, FREET4, T3FREE, THYROIDAB in the last 72 hours. Anemia Panel: No results for input(s): VITAMINB12, FOLATE, FERRITIN, TIBC, IRON, RETICCTPCT in the last 72 hours. Sepsis Labs: No results for input(s): PROCALCITON, LATICACIDVEN in the last 168 hours.  No results found for this or any previous visit (from the past 240 hour(s)).    Radiology Studies: CT ABDOMEN WO CONTRAST  Result Date: 12/02/2019 CLINICAL DATA:  Altered mental status, preop planning for gastrostomy placement EXAM: CT ABDOMEN WITHOUT CONTRAST TECHNIQUE: Multidetector CT imaging of the abdomen was performed following the standard protocol without IV contrast. COMPARISON:  02/15/2019 FINDINGS: Lower chest: Small bilateral pleural effusions. Dependent consolidation/atelectasis in the visualized lung bases. Mitral annulus calcifications. Blood pool is hypodense compared to the interventricular septum suggesting anemia. Hepatobiliary: No focal liver abnormality is seen. Status post cholecystectomy.  No biliary dilatation. Pancreas: Unremarkable. No pancreatic ductal dilatation or surrounding inflammatory changes. Spleen: Normal in size without focal abnormality. Adrenals/Urinary Tract: Prominent adrenal glands left greater than right with some motion degradation. Stable 1.7 cm exophytic process from the upper pole left kidney, incompletely characterized. No urolithiasis or hydronephrosis. Stomach/Bowel: Feeding tube extends to the gastric antrum. The stomach is incompletely distended. There is a broad window to allow percutaneous gastrostomy placement. Visualized small bowel and colon are nondilated. Vascular/Lymphatic: Heavy aortoiliac atheromatous calcifications without aneurysm. No adenopathy localized. Other: No ascites.  No free air. Musculoskeletal: Mild thoracolumbar dextroscoliosis apex L1. Multilevel lumbar spondylitic change. Previous cement augmentation T12 and L1 vertebral bodies. Compression deformity of T11 with approximately 50% loss of height anteriorly, progressive since 02/15/2019. IMPRESSION: 1. There is a broad window to allow percutaneous gastrostomy placement. 2. Small bilateral pleural effusions with adjacent consolidation/atelectasis in the visualized lung bases. 3. T11 compression deformity, progressive since 02/15/2019. Aortic Atherosclerosis (ICD10-I70.0). Electronically Signed   By: Lucrezia Europe M.D.   On: 12/02/2019 08:21    Scheduled Meds: . azelastine  1 spray Each Nare BID  . chlorhexidine  15 mL Mouth Rinse BID  . Chlorhexidine Gluconate Cloth  6 each Topical Q0600  . docusate  100 mg Per Tube BID  . furosemide  20 mg Per Tube Once per day on Mon Wed Fri  . hydrALAZINE  50 mg Per Tube Q8H  . insulin aspart  0-9 Units Subcutaneous Q4H  . insulin aspart  3 Units Subcutaneous Q4H  . insulin glargine  20 Units Subcutaneous Daily  . irbesartan  150 mg Per NG tube Daily  . labetalol  100 mg Per Tube Q6H  . minoxidil  5 mg Per Tube Daily  . simvastatin  20 mg Per Tube Q  M,W,F   Continuous Infusions: . sodium chloride 75 mL/hr at 12/03/19 0203  .  clevidipine Stopped (11/28/19 1110)  . feeding supplement (OSMOLITE 1.2 CAL) Stopped (12/02/19 0102)  . levETIRAcetam 1,000 mg (12/02/19 2110)  . valproate sodium 250 mg (12/03/19 0526)     LOS: 11 days   Time spent: 27 minutes   Darliss Cheney, MD [facility]  12/03/2019, 11:25 AM   To contact the attending provider between 7A-7P or the covering provider during after hours 7P-7A, please log into the web site [email or web address].

## 2019-12-03 NOTE — Progress Notes (Signed)
Patient ID: [name], female   DOB: [birth year], 86 y.o.   MRN: [id number] BP 111/72 (BP Location: Left Arm)    Pulse 92    Temp 98.9 F (37.2 C) (Oral)    Resp 18    Ht 5' (1.524 m)    Wt 59.9 kg    SpO2 100%    BMI 25.79 kg/m  Lethargic, will open eyes to moderate stimulation Not following commands  grunts Moves extremities to noxious stimuli Will try to expedite the peg, as the family is desirous of her returning to friends home where her husband can visit.

## 2019-12-04 LAB — RENAL FUNCTION PANEL
Albumin: 2 g/dL — ABNORMAL LOW (ref 3.5–5.0)
Anion gap: 9 (ref 5–15)
BUN: 49 mg/dL — ABNORMAL HIGH (ref 8–23)
CO2: 25 mmol/L (ref 22–32)
Calcium: 9 mg/dL (ref 8.9–10.3)
Chloride: 107 mmol/L (ref 98–111)
Creatinine, Ser: 1.07 mg/dL — ABNORMAL HIGH (ref 0.44–1.00)
GFR calc Af Amer: 55 mL/min — ABNORMAL LOW (ref >60)
GFR calc non Af Amer: 47 mL/min — ABNORMAL LOW (ref >60)
Glucose, Bld: 134 mg/dL — ABNORMAL HIGH (ref 70–99)
Phosphorus: 5.2 mg/dL — ABNORMAL HIGH (ref 2.5–4.6)
Potassium: 4.9 mmol/L (ref 3.5–5.1)
Sodium: 141 mmol/L (ref 135–145)

## 2019-12-04 LAB — GLUCOSE, CAPILLARY
Glucose-Capillary: 130 mg/dL — ABNORMAL HIGH (ref 70–99)
Glucose-Capillary: 168 mg/dL — ABNORMAL HIGH (ref 70–99)
Glucose-Capillary: 188 mg/dL — ABNORMAL HIGH (ref 70–99)
Glucose-Capillary: 194 mg/dL — ABNORMAL HIGH (ref 70–99)
Glucose-Capillary: 198 mg/dL — ABNORMAL HIGH (ref 70–99)
Glucose-Capillary: 204 mg/dL — ABNORMAL HIGH (ref 70–99)
Glucose-Capillary: 218 mg/dL — ABNORMAL HIGH (ref 70–99)

## 2019-12-04 NOTE — Progress Notes (Signed)
Pt HR in low 100's, temp 100.6, and RR in low 20's. PRN tylenol and scheduled labetalol given. Temp is now 98.5, HR in the 70's and RR 16-20. Will continue to monitor.

## 2019-12-04 NOTE — Progress Notes (Signed)
5953 - Pt diastolic BP in the 96'D. Pt has been running a low grade fever. Temp currently 100.2. Pt RR increasing. RR currently 26. On call provider notified. Provider advised RN to continue to monitor pt and to continue to give PRN tylenol for fever.

## 2019-12-04 NOTE — Progress Notes (Signed)
Patient ID: [name], female   DOB: 05/04/1931, 86 y.o.   MRN: [id number] BP (!) 171/62 (BP Location: Left Arm)    Pulse 91    Temp 98.6 F (37 C) (Axillary)    Resp (!) 21    Ht 5' (1.524 m)    Wt 60 kg    SpO2 98%    BMI 25.83 kg/m  Not responsive to voice No change neurologically Will open eyes to moderate stimulation perrl Will push on the feeding tube tomorrow

## 2019-12-05 LAB — RENAL FUNCTION PANEL
Albumin: 2 g/dL — ABNORMAL LOW (ref 3.5–5.0)
Anion gap: 13 (ref 5–15)
BUN: 59 mg/dL — ABNORMAL HIGH (ref 8–23)
CO2: 22 mmol/L (ref 22–32)
Calcium: 8.8 mg/dL — ABNORMAL LOW (ref 8.9–10.3)
Chloride: 105 mmol/L (ref 98–111)
Creatinine, Ser: 1.3 mg/dL — ABNORMAL HIGH (ref 0.44–1.00)
GFR calc Af Amer: 43 mL/min — ABNORMAL LOW (ref >60)
GFR calc non Af Amer: 37 mL/min — ABNORMAL LOW (ref >60)
Glucose, Bld: 207 mg/dL — ABNORMAL HIGH (ref 70–99)
Phosphorus: 5.8 mg/dL — ABNORMAL HIGH (ref 2.5–4.6)
Potassium: 5.6 mmol/L — ABNORMAL HIGH (ref 3.5–5.1)
Sodium: 140 mmol/L (ref 135–145)

## 2019-12-05 LAB — GLUCOSE, CAPILLARY
Glucose-Capillary: 136 mg/dL — ABNORMAL HIGH (ref 70–99)
Glucose-Capillary: 198 mg/dL — ABNORMAL HIGH (ref 70–99)
Glucose-Capillary: 201 mg/dL — ABNORMAL HIGH (ref 70–99)
Glucose-Capillary: 207 mg/dL — ABNORMAL HIGH (ref 70–99)
Glucose-Capillary: 197 mg/dL — ABNORMAL HIGH (ref 70–99)
Glucose-Capillary: 187 mg/dL — ABNORMAL HIGH (ref 70–99)

## 2019-12-05 MED ORDER — LABETALOL HCL 100 MG PO TABS
100.0000 mg | ORAL_TABLET | Freq: Four times a day (QID) | ORAL | Status: DC
  Administered 2019-12-06 – 2019-12-07 (×3): 100 mg
  Filled 2019-12-05 (×4): qty 1

## 2019-12-05 MED ORDER — SODIUM ZIRCONIUM CYCLOSILICATE 5 G PO PACK
5.0000 g | PACK | Freq: Every day | ORAL | Status: AC
  Administered 2019-12-05: 5 g via ORAL
  Filled 2019-12-05: qty 1

## 2019-12-05 NOTE — Progress Notes (Signed)
PT Cancellation Note  Patient Details Name: [name] MRN: [id number] DOB: June 17, 1931   Cancelled Treatment:    Reason Eval/Treat Not Completed: Medical issues which prohibited therapy; patient not interactive per RN and noting other medical issues overnight with increased RR, decreased BP.  Will cancel for today and attempt again another day.   [name] 12/05/2019, 12:44 PM  Magda Kiel, PT Acute Rehabilitation Services Pager:[phone] Office:[phone] 12/05/2019

## 2019-12-05 NOTE — Progress Notes (Signed)
SLP Cancellation Note  Patient Details Name: [name] MRN: [id number] DOB: October 01, 1931   Cancelled treatment:       Reason Eval/Treat Not Completed: Fatigue/lethargy limiting ability to participate. Pt not alert enough to attempt POs this morning. Will continue to follow as able.   Osie Bond., M.A. [place] [facility] Pager [phone] Office [phone]  12/05/2019, 10:17 AM

## 2019-12-05 NOTE — Progress Notes (Signed)
° °  Providing Compassionate, Quality Care - Together  NEUROSURGERY PROGRESS NOTE   S: No issues overnight.   O: EXAM:  BP (!) 94/45 (BP Location: Left Arm)    Pulse 70    Temp 99.1 F (37.3 C) (Axillary)    Resp (!) 23    Ht 5' (1.524 m)    Wt 62.8 kg    SpO2 97%    BMI 27.04 kg/m    Eyes open to voice PERRL Not FCs WD briskly everything to pain Winces to pain  ASSESSMENT:  86 y.o. female with  1. SDH, parafalcine, acute 2. Szs  PLAN: -continue supportive therapy - sz control per neurology - dc planning, peg planned today v tomorrow, SNF placement afterwards    Thank you for allowing me to participate in this patient's care.  Please do not hesitate to call with questions or concerns.   Elwin Sleight, [facility] Cell: [phone]

## 2019-12-06 ENCOUNTER — Inpatient Hospital Stay (HOSPITAL_COMMUNITY): Payer: PPO

## 2019-12-06 ENCOUNTER — Other Ambulatory Visit: Payer: Self-pay

## 2019-12-06 DIAGNOSIS — J690 Pneumonitis due to inhalation of food and vomit: Secondary | ICD-10-CM | POA: Diagnosis present

## 2019-12-06 DIAGNOSIS — E43 Unspecified severe protein-calorie malnutrition: Secondary | ICD-10-CM

## 2019-12-06 DIAGNOSIS — E785 Hyperlipidemia, unspecified: Secondary | ICD-10-CM

## 2019-12-06 DIAGNOSIS — K219 Gastro-esophageal reflux disease without esophagitis: Secondary | ICD-10-CM

## 2019-12-06 DIAGNOSIS — I2510 Atherosclerotic heart disease of native coronary artery without angina pectoris: Secondary | ICD-10-CM

## 2019-12-06 HISTORY — PX: IR GASTROSTOMY TUBE MOD SED: IMG625

## 2019-12-06 LAB — PROCALCITONIN: Procalcitonin: 0.41 ng/mL

## 2019-12-06 LAB — GLUCOSE, CAPILLARY
Glucose-Capillary: 82 mg/dL (ref 70–99)
Glucose-Capillary: 85 mg/dL (ref 70–99)
Glucose-Capillary: 130 mg/dL — ABNORMAL HIGH (ref 70–99)
Glucose-Capillary: 126 mg/dL — ABNORMAL HIGH (ref 70–99)
Glucose-Capillary: 81 mg/dL (ref 70–99)

## 2019-12-06 LAB — URINALYSIS, ROUTINE W REFLEX MICROSCOPIC
Bacteria, UA: NONE SEEN
Bilirubin Urine: NEGATIVE
Glucose, UA: NEGATIVE mg/dL
Hgb urine dipstick: NEGATIVE
Ketones, ur: NEGATIVE mg/dL
Leukocytes,Ua: NEGATIVE
Nitrite: NEGATIVE
Protein, ur: 30 mg/dL — AB
Specific Gravity, Urine: 1.012 (ref 1.005–1.030)
pH: 5 (ref 5.0–8.0)

## 2019-12-06 LAB — CBC
HCT: 27.1 % — ABNORMAL LOW (ref 36.0–46.0)
Hemoglobin: 8.3 g/dL — ABNORMAL LOW (ref 12.0–15.0)
MCH: 27 pg (ref 26.0–34.0)
MCHC: 30.6 g/dL (ref 30.0–36.0)
MCV: 88.3 fL (ref 80.0–100.0)
Platelets: 452 10*3/uL — ABNORMAL HIGH (ref 150–400)
RBC: 3.07 MIL/uL — ABNORMAL LOW (ref 3.87–5.11)
RDW: 14 % (ref 11.5–15.5)
WBC: 19.6 10*3/uL — ABNORMAL HIGH (ref 4.0–10.5)
nRBC: 0 % (ref 0.0–0.2)

## 2019-12-06 LAB — RENAL FUNCTION PANEL
Albumin: 1.9 g/dL — ABNORMAL LOW (ref 3.5–5.0)
Anion gap: 11 (ref 5–15)
BUN: 59 mg/dL — ABNORMAL HIGH (ref 8–23)
CO2: 25 mmol/L (ref 22–32)
Calcium: 8.9 mg/dL (ref 8.9–10.3)
Chloride: 103 mmol/L (ref 98–111)
Creatinine, Ser: 1.1 mg/dL — ABNORMAL HIGH (ref 0.44–1.00)
GFR calc Af Amer: 53 mL/min — ABNORMAL LOW (ref >60)
GFR calc non Af Amer: 46 mL/min — ABNORMAL LOW (ref >60)
Glucose, Bld: 76 mg/dL (ref 70–99)
Phosphorus: 5.6 mg/dL — ABNORMAL HIGH (ref 2.5–4.6)
Potassium: 4.8 mmol/L (ref 3.5–5.1)
Sodium: 139 mmol/L (ref 135–145)

## 2019-12-06 LAB — MAGNESIUM: Magnesium: 2.3 mg/dL (ref 1.7–2.4)

## 2019-12-06 LAB — PROTIME-INR
INR: 1.1 (ref 0.8–1.2)
Prothrombin Time: 13.5 seconds (ref 11.4–15.2)

## 2019-12-06 LAB — LACTIC ACID, PLASMA
Lactic Acid, Venous: 0.8 mmol/L (ref 0.5–1.9)
Lactic Acid, Venous: 0.9 mmol/L (ref 0.5–1.9)

## 2019-12-06 MED ORDER — METRONIDAZOLE IN NACL 5-0.79 MG/ML-% IV SOLN: 500.0000 mg | Freq: Three times a day (TID) | INTRAVENOUS | Status: DC

## 2019-12-06 MED ORDER — GLUCAGON HCL RDNA (DIAGNOSTIC) 1 MG IJ SOLR
INTRAMUSCULAR | Status: AC
  Filled 2019-12-06: qty 1

## 2019-12-06 MED ORDER — GLUCAGON HCL RDNA (DIAGNOSTIC) 1 MG IJ SOLR
INTRAMUSCULAR | Status: AC | PRN
  Administered 2019-12-06: 1 mg via INTRAVENOUS

## 2019-12-06 MED ORDER — CEFAZOLIN SODIUM-DEXTROSE 2-4 GM/100ML-% IV SOLN
2.0000 g | INTRAVENOUS | Status: AC
  Administered 2019-12-06: 2 g via INTRAVENOUS
  Filled 2019-12-06: qty 100

## 2019-12-06 MED ORDER — CEFAZOLIN SODIUM-DEXTROSE 2-4 GM/100ML-% IV SOLN
INTRAVENOUS | Status: AC
  Filled 2019-12-06: qty 100

## 2019-12-06 MED ORDER — PIPERACILLIN-TAZOBACTAM IN DEX 2-0.25 GM/50ML IV SOLN: 2.2500 g | Freq: Four times a day (QID) | INTRAVENOUS | Status: DC

## 2019-12-06 MED ORDER — LIDOCAINE HCL 1 % IJ SOLN
INTRAMUSCULAR | Status: AC | PRN
  Administered 2019-12-06: 10 mL

## 2019-12-06 MED ORDER — VANCOMYCIN HCL 750 MG/150ML IV SOLN: 750.0000 mg | INTRAVENOUS | Status: DC

## 2019-12-06 MED ORDER — IOHEXOL 300 MG/ML  SOLN
50.0000 mL | Freq: Once | INTRAMUSCULAR | Status: AC | PRN
  Administered 2019-12-06: 15 mL

## 2019-12-06 MED ORDER — HEPARIN SODIUM (PORCINE) 5000 UNIT/ML IJ SOLN
5000.0000 [IU] | Freq: Three times a day (TID) | INTRAMUSCULAR | Status: DC
  Administered 2019-12-07 – 2019-12-08 (×4): 5000 [IU] via SUBCUTANEOUS
  Filled 2019-12-06 (×4): qty 1

## 2019-12-06 MED ORDER — SODIUM CHLORIDE 0.9 % IV SOLN
3.0000 g | Freq: Two times a day (BID) | INTRAVENOUS | Status: DC
  Administered 2019-12-06 – 2019-12-08 (×4): 3 g via INTRAVENOUS
  Filled 2019-12-06: qty 8
  Filled 2019-12-06 (×4): qty 3

## 2019-12-06 MED ORDER — LIDOCAINE HCL 1 % IJ SOLN
INTRAMUSCULAR | Status: AC
  Filled 2019-12-06: qty 20

## 2019-12-06 NOTE — Progress Notes (Signed)
PT Cancellation Note  Patient Details Name: [name] MRN: [id number] DOB: Sep 16, 1931   Cancelled Treatment:    Reason Eval/Treat Not Completed: Patient at procedure or test/unavailable; patient down for PEG tube placement.  Will cancel for today and attempt again another day.    [name] 12/06/2019, 11:45 AM  Magda Kiel, PT Acute Rehabilitation Services FQHKU:[phone] [phone] 12/06/2019

## 2019-12-06 NOTE — Progress Notes (Signed)
  Pharmacy Antibiotic Note  [name] is a 86 y.o. female admitted on 11/22/2019 with aspiration pneumonia.  Pharmacy has been consulted for ampicillin/sulbactam dosing.  Afebrile. WBC elevated. CXR with signs of pneumonia. MRSA neg. After discussion with MD, agreed to change pip/tazo, vancomycin, metronidazole to amp/sulb. Renal function borderline for dose adjustment, will watch.  Plan: Start amp/sulb 3g Q12hr  Monitor cultures, clinical status, renal fx Narrow abx as able and f/u duration ,   Height: 5' (152.4 cm) Weight: 63.9 kg (140 lb 14 oz) IBW/kg (Calculated) : 45.5  Temp (24hrs), Avg:99.3 F (37.4 C), Min:97.6 F (36.4 C), Max:100.6 F (38.1 C)  Recent Labs  Lab 12/01/19 0827 12/01/19 0827 12/02/19 0550 12/03/19 0451 12/04/19 0606 12/05/19 0706 12/06/19 0604  WBC 10.0  --   --   --   --   --  19.6*  CREATININE 1.09*   < > 1.03* 1.08* 1.07* 1.30* 1.10*   < > = values in this interval not displayed.    Estimated Creatinine Clearance: 31.2 mL/min (A) (by C-G formula based on SCr of 1.1 mg/dL (H)).    Allergies  Allergen Reactions  . Fentanyl Anaphylaxis    Cardiac arrest and coded  . Fish Allergy Anaphylaxis and Rash  . Morphine And Related Anaphylaxis    Cardiac arrest and coded Per Dr. Conley Canal patient has taken percocet without issue  . Singulair [Montelukast] Palpitations and Other (See Comments)    Increased BP and HR  . Actonel [Risedronate Sodium] Rash and Other (See Comments)    weakness  . Dilaudid [Hydromorphone Hcl] Rash and Other (See Comments)    Crying and screaming  . Fosamax [Alendronate Sodium] Rash and Other (See Comments)    Weakness and myalgias  . Hydralazine Diarrhea and Other (See Comments)    Swelling in stomach  . Lipitor [Atorvastatin] Swelling and Rash    Tongue swelling   . Lisinopril Diarrhea, Rash and Cough  . Septra [Sulfamethoxazole-Trimethoprim] Nausea And Vomiting and Rash  . Tekturna [Aliskiren] Swelling and Rash     Swelling of tongue  . Tricor [Fenofibrate] Rash and Other (See Comments)    Flu symptoms  . Imdur [Isosorbide Dinitrate] Other (See Comments)    Headaches and blindness  . Amlodipine Diarrhea  . Avapro [Irbesartan] Rash  . Cardio Complete [Nutritional Supplements] Rash  . Cardizem [Diltiazem Hcl] Rash  . Ciprofloxacin Rash and Other (See Comments)    Black spot on body  . Codeine Rash  . Cyclobenzaprine Nausea Only  . Forteo [Parathyroid Hormone (Recomb)] Rash  . Inapsine [Droperidol] Rash  . Ivp Dye [Iodinated Diagnostic Agents] Rash  . Shellfish Allergy Rash  . Tussionex Pennkinetic Er [Hydrocod Polst-Cpm Polst Er] Itching and Rash    Antimicrobials this admission: Amp/sulb 8/24 >>     Microbiology results: 8/24 BCx: pend 8/24 MRSA PCR: neg  Thank you for allowing pharmacy to be a part of this patient's care.  Benetta Spar, PharmD, BCPS, BCCP Clinical Pharmacist  Please check AMION for all [place] phone numbers After 10:00 PM, call [place] [phone]

## 2019-12-06 NOTE — Progress Notes (Signed)
OT Cancellation Note  Patient Details Name: ALLANA SHRESTHA MRN: [id number] DOB: 06-08-1931   Cancelled Treatment:    Reason Eval/Treat Not Completed: Patient at procedure or test/ unavailable Pt getting PEG tube placed at time of arrival, will check back as time permits.   Corinne Ports E. Jaena Brocato, COTA/L Acute Rehabilitation Services [phone] [place] 12/06/2019, 12:41 PM

## 2019-12-06 NOTE — Progress Notes (Signed)
Patient transporting vis bed to IR at this time.

## 2019-12-06 NOTE — Progress Notes (Signed)
Nutrition Follow-up  DOCUMENTATION CODES:   Severe malnutrition in context of chronic illness  INTERVENTION:  Once PEG ready for tube feeding use (24 hours post tube placement), Recommend continuation of Osmolite 1.2 formula at goal rate of 55 ml/hr.   Tube feeding to provide 1584 kcal, 73 grams of protein, and 1082 ml free water.   Once IV fluids are discontinued, recommend free water flushes of 100 ml q 6 hours per tube to provide total free water of 1482 ml.   NUTRITION DIAGNOSIS:   Severe Malnutrition related to chronic illness (CAD, CKD, DM) as evidenced by severe fat depletion, severe muscle depletion; ongoing  GOAL:   Patient will meet greater than or equal to 90% of their needs; to be met with TF  MONITOR:   TF tolerance  REASON FOR ASSESSMENT:   Consult Enteral/tube feeding initiation and management  ASSESSMENT:   Pt with PMH of CAD, HTN, HLD, DM, stroke, CKD, and GERD admitted s/p multiple falls with acute bilateral SDH.  Pt continues to be not alert enough for POs. Pt continues on NPO status. PEG placed today by IR. Plan for SNF placement upon discharge. Per IR, tube feeding may be initiated 24 hours past tube placement. Once able to restart tube feeds, recommend continuation of current tube feeding orders. Recommend providing free water flushes via PEG when IV fluids are discontinued. Recommendations stated above. Labs and medications reviewed.   Diet Order:   Diet Order            Diet NPO time specified  Diet effective now                 EDUCATION NEEDS:   No education needs have been identified at this time  Skin:  Skin Assessment: Reviewed RN Assessment  Last BM:  8/24  Height:   Ht Readings from Last 1 Encounters:  11/22/19 5' (1.524 m)    Weight:   Wt Readings from Last 1 Encounters:  12/06/19 63.9 kg    BMI:  Body mass index is 27.51 kg/m.  Estimated Nutritional Needs:   Kcal:  1450-1700  Protein:  70-80 grams  Fluid:  >  1.5 L/day  Corrin [name], MS, RD, LDN RD pager number/after hours weekend pager number on Amion.

## 2019-12-06 NOTE — Progress Notes (Signed)
   Providing Compassionate, Quality Care - Together  NEUROSURGERY PROGRESS NOTE   S: No issues overnight, some tachycardia  O: EXAM:  BP 131/66 (BP Location: Left Arm)   Pulse 92   Temp 97.6 F (36.4 C) (Axillary)   Resp 18   Ht 5' (1.524 m)   Wt 63.9 kg   SpO2 98%   BMI 27.51 kg/m    Eyes open to pain PERRL Not FCs WD briskly everything to pain Winces to pain  ASSESSMENT:  86 y.o. female with  1. SDH, parafalcine, acute 2. Szs  PLAN: -continue supportive therapy - sz control per neurology - dc planning,peg planned today, SNF placement afterwards, hopefully tomorrow - therapy as tolerated - scds   Thank you for allowing me to participate in this patient's care.  Please do not hesitate to call with questions or concerns.   Elwin Sleight, [facility] Cell: [phone]

## 2019-12-06 NOTE — Procedures (Signed)
Pre procedure Dx: Dysphagia Post Procedure Dx: Same  Successful fluoroscopic guided insertion of gastrostomy tube.   The gastrostomy tube may be used immediately for medications.   Tube feeds may be initiated in 24 hours as per the primary team.    EBL: Minimal  Complications: None immediate  Jay Tristen Pennino, MD Pager #: [phone]    

## 2019-12-06 NOTE — Consult Note (Addendum)
Medical Consultation   [name]  QIW:[id number]  DOB: 1931/12/25  DOA: 11/22/2019  PCP: [name], MD   Requesting physician: Dr. Reatha Armour  Reason for consultation: Aspiration pneumonia   History of Present Illness: [name] is an 86 y.o. female with past medical history of hypertension, coronary artery disease, type 2 diabetes mellitus, GERD, left carotid artery stenosis, chronic dizziness, frequent falls, chronic diastolic CHF with preserved ejection fraction, dementia admitted by neurosurgery with subdural hematoma status post fall on 11/22/2019.  Patient underwent PEG tube placement this morning by IR.  Patient is nonverbal at baseline.  She developed fever of 100.6 with leukocytosis of 19.6, chest x-ray was performed which showed worsening left basilar opacities.  UA, UC, BC: Ordered and patient started on Unasyn.  [facility] hospitalist consulted for management of possible aspiration pneumonia.  History gathered from charts and RN who is taking care of the patient.  Upon my evaluation: Patient resting comfortably on the bed.  Sleepy, not arousable with verbal command, nonverbal, on 2 L of oxygen via nasal cannula, not in respiratory distress. RN reported no change in patient's mental status & she is on 2 L for long time.   Of note: During this hospitalization patient became bradycardic and [facility] hospitalist was consulted on 11/25/2019 for management of sinus bradycardia.  Patient's clonidine was discontinued and was replaced with minoxidil after discussing with cardiology.  Currently her heart rate is within normal limits (80s).  Past Medical History:  Diagnosis Date  . BPPV (benign paroxysmal positional vertigo) 01/08/2016  . Bradycardia    a. Coreg decreased due to HR low 40s on event monitor Aug 03, 2015 -> further decreased due to HR upper 30s in 01/2016.  . Breast nodule 02/15/2019  . CAD (coronary artery disease)    a. RV infarct 08/02/2005 c/b high grade AV block  with total occlusion of RCA, unable to treat with PCI. b. Nuc 10/2015: scar but no ischemia.  . Carotid artery disease ([place])   . Chronic anemia   . Chronic lower back pain   . CKD (chronic kidney disease), stage III   . Complication of anesthesia    "almost died when I had my breast reduction"  . Diabetes ([place]) 08-02-2000  . Dyslipidemia   . Gait abnormality 09/06/2019  . GERD (gastroesophageal reflux disease)   . Heart murmur   . High cholesterol   . History of hiatal hernia   . HTN (hypertension)   . Hyperthyroidism   . Memory problem 02/15/2019  . Meniere disease    "for years" takes Antivert 3 times per day (01/31/2016)  . Myocardial infarction [facility]) August 02, 2005   "almost died"  . Near syncope   . Osteoporosis   . Presbycusis of both ears 01/29/2016  . Renal artery stenosis (HCC)    a. left renal artery stent Aug 03, 2010. b. s/p repeat left renal stenting 01/2016.  . Stroke [facility])    a. Prior infarcts seen on head CT.  . TMJ pain dysfunction syndrome 08/26/2016  . Type II diabetes mellitus ([place])    "controlled w/diet and exercise only" (01/31/2016)  . Weakness 02/26/2015         Review of Systems:  ROS As per HPI otherwise 10 point review of systems negative.     Past Medical History: Past Medical History:  Diagnosis Date  . BPPV (benign paroxysmal positional vertigo) 01/08/2016  . Bradycardia  a. Coreg decreased due to HR low 40s on event monitor 07-08-2015 -> further decreased due to HR upper 30s in 01/2016.  . Breast nodule 02/15/2019  . CAD (coronary artery disease)    a. RV infarct Jul 07, 2005 c/b high grade AV block with total occlusion of RCA, unable to treat with PCI. b. Nuc 10/2015: scar but no ischemia.  . Carotid artery disease ([place])   . Chronic anemia   . Chronic lower back pain   . CKD (chronic kidney disease), stage III   . Complication of anesthesia    "almost died when I had my breast reduction"  . Diabetes ([place]) 07-07-2000  . Dyslipidemia   . Gait abnormality 09/06/2019  . GERD  (gastroesophageal reflux disease)   . Heart murmur   . High cholesterol   . History of hiatal hernia   . HTN (hypertension)   . Hyperthyroidism   . Memory problem 02/15/2019  . Meniere disease    "for years" takes Antivert 3 times per day (01/31/2016)  . Myocardial infarction [facility]) Jul 07, 2005   "almost died"  . Near syncope   . Osteoporosis   . Presbycusis of both ears 01/29/2016  . Renal artery stenosis (HCC)    a. left renal artery stent July 08, 2010. b. s/p repeat left renal stenting 01/2016.  . Stroke [facility])    a. Prior infarcts seen on head CT.  . TMJ pain dysfunction syndrome 08/26/2016  . Type II diabetes mellitus ([place])    "controlled w/diet and exercise only" (01/31/2016)  . Weakness 02/26/2015    Past Surgical History: Past Surgical History:  Procedure Laterality Date  . ANTERIOR CERVICAL DECOMP/DISCECTOMY FUSION  02/2002   Archie Endo 08/26/2010  . APPENDECTOMY    . BACK SURGERY    . BIOPSY  07/20/2019   Procedure: BIOPSY;  Surgeon: Otis Brace, MD;  Location: [facility];  Service: Gastroenterology;;  . BLADDER SUSPENSION  x3  . BREAST LUMPECTOMY  11/2007   breast needle-localized lumpectomy/notes 08/12/2012  . CARDIAC CATHETERIZATION  07-Jul-2005   "couldn't get stents in; I almost died'  . CATARACT EXTRACTION W/ INTRAOCULAR LENS  IMPLANT, BILATERAL Bilateral   . CATARACT EXTRACTION W/ INTRAOCULAR LENS  IMPLANT, BILATERAL Bilateral   . CHOLECYSTECTOMY OPEN    . ESOPHAGOGASTRODUODENOSCOPY (EGD) WITH PROPOFOL N/A 07/20/2019   Procedure: ESOPHAGOGASTRODUODENOSCOPY (EGD) WITH PROPOFOL;  Surgeon: Otis Brace, MD;  Location: [facility];  Service: Gastroenterology;  Laterality: N/A;  . FRACTURE SURGERY    . HIP FRACTURE SURGERY Right Jul 07, 2009   "added rod in my leg and a ball"  . IR GASTROSTOMY TUBE MOD SED  12/06/2019  . KYPHOPLASTY N/A 03/20/2015   Procedure: THORACIC TWELVE KYPHOPLASTY;  Surgeon: Jovita Gamma, MD;  Location: [place] NEURO ORS;  Service: Neurosurgery;  Laterality: N/A;  .  KYPHOPLASTY N/A 04/23/2015   Procedure: KYPHOPLASTY - LUMBAR ONE;  Surgeon: Jovita Gamma, MD;  Location: [place] NEURO ORS;  Service: Neurosurgery;  Laterality: N/A;  L1 Kyphoplasty  . NECK HARDWARE REMOVAL  11/2002   "replaced screw"  . PERIPHERAL VASCULAR CATHETERIZATION Left 12/20/2014   Procedure: Renal Angiography;  Surgeon: Wellington Hampshire, MD;  Location: [place] CV LAB;  Service: Cardiovascular;  Laterality: Left;  . PERIPHERAL VASCULAR CATHETERIZATION N/A 01/31/2016   Procedure: Renal Angiography;  Surgeon: Lorretta Harp, MD;  Location: [place] CV LAB;  Service: Cardiovascular;  Laterality: N/A;  . PERIPHERAL VASCULAR CATHETERIZATION N/A 01/31/2016   Procedure: Abdominal Aortogram;  Surgeon: Lorretta Harp, MD;  Location: [place] CV LAB;  Service: Cardiovascular;  Laterality: N/A;  . PERIPHERAL VASCULAR CATHETERIZATION  01/31/2016   Procedure: Peripheral Vascular Intervention;  Surgeon: Lorretta Harp, MD;  Location: [place] CV LAB;  Service: Cardiovascular;;  . REDUCTION MAMMAPLASTY Bilateral 1985  . renal arteriogram  12/2014   occluded previous Lt renal stent and patent rt renal artery.  [name] RENAL ARTERY STENT Left 2012  . TONSILLECTOMY    . TUBAL LIGATION    . VAGINAL HYSTERECTOMY  ~ 1995  . WRIST FRACTURE SURGERY Left 2012     Allergies:   Allergies  Allergen Reactions  . Fentanyl Anaphylaxis    Cardiac arrest and coded  . Fish Allergy Anaphylaxis and Rash  . Morphine And Related Anaphylaxis    Cardiac arrest and coded Per Dr. Conley Canal patient has taken percocet without issue  . Singulair [Montelukast] Palpitations and Other (See Comments)    Increased BP and HR  . Actonel [Risedronate Sodium] Rash and Other (See Comments)    weakness  . Dilaudid [Hydromorphone Hcl] Rash and Other (See Comments)    Crying and screaming  . Fosamax [Alendronate Sodium] Rash and Other (See Comments)    Weakness and myalgias  . Hydralazine Diarrhea and Other (See  Comments)    Swelling in stomach  . Lipitor [Atorvastatin] Swelling and Rash    Tongue swelling   . Lisinopril Diarrhea, Rash and Cough  . Septra [Sulfamethoxazole-Trimethoprim] Nausea And Vomiting and Rash  . Tekturna [Aliskiren] Swelling and Rash    Swelling of tongue  . Tricor [Fenofibrate] Rash and Other (See Comments)    Flu symptoms  . Imdur [Isosorbide Dinitrate] Other (See Comments)    Headaches and blindness  . Amlodipine Diarrhea  . Avapro [Irbesartan] Rash  . Cardio Complete [Nutritional Supplements] Rash  . Cardizem [Diltiazem Hcl] Rash  . Ciprofloxacin Rash and Other (See Comments)    Black spot on body  . Codeine Rash  . Cyclobenzaprine Nausea Only  . Forteo [Parathyroid Hormone (Recomb)] Rash  . Inapsine [Droperidol] Rash  . Ivp Dye [Iodinated Diagnostic Agents] Rash  . Shellfish Allergy Rash  . Tussionex Pennkinetic Er [Hydrocod Polst-Cpm Polst Er] Itching and Rash     Social History:  reports that she has quit smoking. Her smoking use included cigarettes. She has a 30.00 pack-year smoking history. She has never used smokeless tobacco. She reports that she does not drink alcohol and does not use drugs.   Family History: Family History  Problem Relation Age of Onset  . Diabetes type II Mother   . Heart attack Mother   . Heart disease Mother   . Stroke Mother   . Congestive Heart Failure Father   . Pulmonary embolism Father   . Alzheimer's disease Sister   . Heart disease Sister   . Heart disease Brother   . Healthy Sister   . Healthy Sister   . Stroke Brother   . Dementia Brother        VASCULAR  . Stroke Brother   . Diabetes type II Brother   . Other Brother        KILLED IN PLANE CRASH  . Pulmonary disease Sister   . Heart attack Sister   . Kidney failure Sister   . Diabetes type II Sister   . Diabetes type II Sister   . Kidney failure Sister       Physical Exam: Vitals:   12/06/19 0742 12/06/19 1235 12/06/19 1245 12/06/19 1255  BP:  131/66 (!) 145/86 120/67 (!) 107/58  Pulse: 92 93 89 86  Resp: 18 17 17 18   Temp: 97.6 F (36.4 C)     TempSrc: Axillary     SpO2: 98% 96% 96% 96%  Weight:      Height:        Constitutional: Sleepy, nonverbal, not arousable Eyes: PERLA Neck: neck appears normal, no masses, normal ROM, no thyromegaly, no JVD  CVS: S1-S2 clear, no  rubs or gallops, no LE edema, normal pedal pulses  Respiratory: Coarse breath sounds noted bilaterally.  No wheezing. Abdomen: soft nontender, nondistended, normal bowel sounds, no hepatosplenomegaly, no hernias  Musculoskeletal: : no cyanosis, clubbing or edema noted bilaterally                       Joint/bones/muscle exam, strength, contractures or atrophy Neuro: Sleepy, not arousable with verbal command. Skin: no rashes or lesions or ulcers, no induration or nodules    Data reviewed:  I have personally reviewed following labs and imaging studies Labs:  CBC: Recent Labs  Lab 12/01/19 0827 12/06/19 0604  WBC 10.0 19.6*  NEUTROABS 8.1*  --   HGB 7.7* 8.3*  HCT 24.7* 27.1*  MCV 88.8 88.3  PLT 262 452*    Basic Metabolic Panel: Recent Labs  Lab 12/02/19 0550 12/02/19 0550 12/03/19 0451 12/03/19 0451 12/04/19 0606 12/04/19 0606 12/05/19 0706 12/06/19 0604  NA 134*  --  139  --  141  --  140 139  K 4.8   < > 4.6   < > 4.9   < > 5.6* 4.8  CL 103  --  104  --  107  --  105 103  CO2 23  --  25  --  25  --  22 25  GLUCOSE 186*  --  161*  --  134*  --  207* 76  BUN 49*  --  47*  --  49*  --  59* 59*  CREATININE 1.03*  --  1.08*  --  1.07*  --  1.30* 1.10*  CALCIUM 8.9  --  8.9  --  9.0  --  8.8* 8.9  PHOS 5.6*  --  5.3*  --  5.2*  --  5.8* 5.6*   < > = values in this interval not displayed.   GFR Estimated Creatinine Clearance: 31.2 mL/min (A) (by C-G formula based on SCr of 1.1 mg/dL (H)). Liver Function Tests: Recent Labs  Lab 12/02/19 0550 12/03/19 0451 12/04/19 0606 12/05/19 0706 12/06/19 0604  ALBUMIN 2.3* 2.0* 2.0* 2.0*  1.9*   No results for input(s): LIPASE, AMYLASE in the last 168 hours. No results for input(s): AMMONIA in the last 168 hours. Coagulation profile Recent Labs  Lab 12/06/19 0604  INR 1.1    Cardiac Enzymes: No results for input(s): CKTOTAL, CKMB, CKMBINDEX, TROPONINI in the last 168 hours. BNP: Invalid input(s): POCBNP CBG: Recent Labs  Lab 12/05/19 1958 12/05/19 2329 12/06/19 0335 12/06/19 0744 12/06/19 1408  GLUCAP 197* 187* 82 85 130*   D-Dimer No results for input(s): DDIMER in the last 72 hours. Hgb A1c No results for input(s): HGBA1C in the last 72 hours. Lipid Profile No results for input(s): CHOL, HDL, LDLCALC, TRIG, CHOLHDL, LDLDIRECT in the last 72 hours. Thyroid function studies No results for input(s): TSH, T4TOTAL, T3FREE, THYROIDAB in the last 72 hours.  Invalid input(s): FREET3 Anemia work up No results for input(s): VITAMINB12, FOLATE, FERRITIN, TIBC, IRON, RETICCTPCT in the last 72 hours. Urinalysis  Component Value Date/Time   COLORURINE YELLOW 08/05/2019 1553   APPEARANCEUR CLEAR 08/05/2019 1553   LABSPEC 1.007 08/05/2019 1553   PHURINE 6.5 08/05/2019 1553   GLUCOSEU NEGATIVE 08/05/2019 1553   HGBUR TRACE (A) 08/05/2019 1553   BILIRUBINUR Negative 09/06/2019 1105   KETONESUR NEGATIVE 08/05/2019 1553   PROTEINUR Positive (A) 09/06/2019 1105   PROTEINUR 2+ (A) 08/05/2019 1553   UROBILINOGEN 1.0 09/06/2019 1105   UROBILINOGEN 0.2 02/26/2015 0812   NITRITE Positive 09/06/2019 1105   NITRITE POSITIVE (A) 08/05/2019 1553   LEUKOCYTESUR Small (1+) (A) 09/06/2019 1105   LEUKOCYTESUR 1+ (A) 08/05/2019 1553     Microbiology No results found for this or any previous visit (from the past 240 hour(s)).     Inpatient Medications:   Scheduled Meds: . azelastine  1 spray Each Nare BID  . chlorhexidine  15 mL Mouth Rinse BID  . Chlorhexidine Gluconate Cloth  6 each Topical Q0600  . docusate  100 mg Per Tube BID  . furosemide  20 mg Per Tube  Once per day on Mon Wed Fri  . glucagon (human recombinant)      . [START ON 12/07/2019] heparin injection (subcutaneous)  5,000 Units Subcutaneous Q8H  . hydrALAZINE  50 mg Per Tube Q8H  . insulin aspart  0-9 Units Subcutaneous Q4H  . insulin aspart  3 Units Subcutaneous Q4H  . insulin glargine  20 Units Subcutaneous Daily  . labetalol  100 mg Per Tube Q6H  . lidocaine      . minoxidil  5 mg Per Tube Daily  . simvastatin  20 mg Per Tube Q M,W,F   Continuous Infusions: . sodium chloride 75 mL/hr at 12/06/19 1410  . ceFAZolin    . clevidipine Stopped (11/28/19 1110)  . feeding supplement (OSMOLITE 1.2 CAL) Stopped (12/05/19 2357)  . levETIRAcetam 1,000 mg (12/06/19 1008)  . valproate sodium 250 mg (12/06/19 1444)     Radiological Exams on Admission: DG Chest 1 View  Result Date: 12/06/2019 CLINICAL DATA:  Leukocytosis EXAM: CHEST  1 VIEW COMPARISON:  11/28/2019 FINDINGS: Grossly unchanged cardiac silhouette and mediastinal contours with atherosclerotic plaque within the thoracic aorta. Worsening left basilar heterogeneous/consolidative opacities. Trace bilateral effusions are not excluded. Pulmonary venous congestion without frank evidence of edema. No pneumothorax. No acute osseous abnormalities. Post lower cervical ACDF, incompletely evaluated. Post lumbar cement augmentation, incompletely evaluated. Vascular calcifications overlies expected location of the right carotid bulb. Interval removal of enteric tube. IMPRESSION: Worsening left basilar opacities, potentially atelectasis though conceivably infection and/or aspiration could have a similar appearance. Electronically Signed   By: Sandi Mariscal M.D.   On: 12/06/2019 13:59   IR GASTROSTOMY TUBE MOD SED  Result Date: 12/06/2019 INDICATION: Dysphagia. Please perform percutaneous gastrostomy tube placement for enteric nutrition supplementation purposes. EXAM: PULL TROUGH GASTROSTOMY TUBE PLACEMENT COMPARISON:  Abdominal CT-12/02/2019  MEDICATIONS: None, the patient is currently admitted to the hospital receiving intravenous antibiotics; Antibiotics were administered within 1 hour of the procedure. Glucagon 1 mg IV CONTRAST:  Fifteen mL of Omnipaque 300 administered into the gastric lumen. ANESTHESIA/SEDATION: None FLUOROSCOPY TIME:  2 minutes, 6 seconds (31 mGy) COMPLICATIONS: None immediate. PROCEDURE: Informed written consent was obtained from the patient's family following explanation of the procedure, risks, benefits and alternatives. A time out was performed prior to the initiation of the procedure. Ultrasound scanning was performed to demarcate the edge of the left lobe of the liver. Maximal barrier sterile technique utilized including caps, mask, sterile gowns, sterile gloves, large  sterile drape, hand hygiene and Betadine prep. The left upper quadrant was sterilely prepped and draped. An oral gastric catheter was inserted into the stomach under fluoroscopy. The existing nasogastric feeding tube was removed. The left costal margin and air opacified transverse colon were identified and avoided. Air was injected into the stomach for insufflation and visualization under fluoroscopy. Under sterile conditions a 17 gauge trocar needle was utilized to access the stomach percutaneously beneath the left subcostal margin after the overlying soft tissues were anesthetized with 1% Lidocaine with epinephrine. Needle position was confirmed within the stomach with aspiration of air and injection of small amount of contrast. A single T tack was deployed for gastropexy. Over an Amplatz guide wire, a 9-French sheath was inserted into the stomach. A snare device was utilized to capture the oral gastric catheter. The snare device was pulled retrograde from the stomach up the esophagus and out the oropharynx. The 20-French pull-through gastrostomy was connected to the snare device and pulled antegrade through the oropharynx down the esophagus into the stomach  and then through the percutaneous tract external to the patient. The gastrostomy was assembled externally. Contrast injection confirms position in the stomach. Several spot radiographic images were obtained in various obliquities for documentation. The patient tolerated procedure well without immediate post procedural complication. FINDINGS: After successful fluoroscopic guided placement, the gastrostomy tube is appropriately positioned with internal disc against the ventral aspect of the gastric lumen. IMPRESSION: Successful fluoroscopic insertion of a 20-French pull-through gastrostomy tube. The gastrostomy may be used immediately for medication administration and in 24 hrs for the initiation of feeds. Electronically Signed   By: Sandi Mariscal M.D.   On: 12/06/2019 13:58    Impression/Recommendations Principal Problem:   Aspiration pneumonia (HCC) Active Problems:   DM (diabetes mellitus) ([place])   CAD (coronary artery disease)   HTN (hypertension)   Hyperlipidemia   GERD (gastroesophageal reflux disease)   CKD (chronic kidney disease), stage III   Bradycardia   Subdural hematoma (HCC)   Protein-calorie malnutrition, severe   Sepsis 2/2 Aspiration pneumonia: -Patient had fever of 100.6 last night with leukocytosis of 19.6.  Chest x-ray concerning for worsening left basilar opacities.  On 2 L of oxygen via nasal cannula-chronic -Started on Unasyn-cont. same -Check lactic acid, procalcitonin level, UA, UC, BC -On continuous pulse ox. -Keep her n.p.o. -Monitor vitals closely.  Continue gentle IV hydration.   -Tylenol as needed for fever more than 100.6  Thrombocytosis: Platelet: 452 -Likely reactive due to underlying infection -Repeat CBC tomorrow a.m.  CKD stage IIIa: Stable -Continue to monitor.  Hypertension: Stable -Continue hydralazine, labetalol, minoxidil  Type 2 diabetes mellitus: Continue sliding scale insulin and Lantus 20 units daily  Hyperlipidemia: Continue  statin  Coronary artery disease: Continue nitro as needed, statin, labetalol  Seizure prophylaxis: Continue valproate, Keppra  Subdural hematoma: -Status post fall. -Managed by neurosurgery.  Chronic diastolic CHF: Patient appears euvolemic on exam -Reviewed echo from 02/16/2019-ejection fraction of 55 to 60% with grade 1 diastolic dysfunction.  Moderate aortic valve stenosis. -Continue statin, Lasix 3 times per week.  Strict INO's and daily weight -Monitor signs of fluid overload.  Dysphagia/poor p.o. intake: -S/p PEG tube placement on 12/06/2019 by IR.  Thank you for this consultation.  Our [facility] hospitalist team will follow the patient with you.   Time Spent: 35 minutes  Mckinley Jewel M.D. [facility] 12/06/2019, 4:30 PM

## 2019-12-07 ENCOUNTER — Encounter: Payer: Self-pay | Admitting: Internal Medicine

## 2019-12-07 DIAGNOSIS — J690 Pneumonitis due to inhalation of food and vomit: Secondary | ICD-10-CM

## 2019-12-07 LAB — CBC WITH DIFFERENTIAL/PLATELET
Abs Immature Granulocytes: 0.17 10*3/uL — ABNORMAL HIGH (ref 0.00–0.07)
Basophils Absolute: 0 10*3/uL (ref 0.0–0.1)
Basophils Relative: 0 %
Eosinophils Absolute: 0.1 10*3/uL (ref 0.0–0.5)
Eosinophils Relative: 0 %
HCT: 28.2 % — ABNORMAL LOW (ref 36.0–46.0)
Hemoglobin: 9 g/dL — ABNORMAL LOW (ref 12.0–15.0)
Immature Granulocytes: 1 %
Lymphocytes Relative: 6 %
Lymphs Abs: 1 10*3/uL (ref 0.7–4.0)
MCH: 28.3 pg (ref 26.0–34.0)
MCHC: 31.9 g/dL (ref 30.0–36.0)
MCV: 88.7 fL (ref 80.0–100.0)
Monocytes Absolute: 0.9 10*3/uL (ref 0.1–1.0)
Monocytes Relative: 6 %
Neutro Abs: 14.3 10*3/uL — ABNORMAL HIGH (ref 1.7–7.7)
Neutrophils Relative %: 87 %
Platelets: 463 10*3/uL — ABNORMAL HIGH (ref 150–400)
RBC: 3.18 MIL/uL — ABNORMAL LOW (ref 3.87–5.11)
RDW: 13.7 % (ref 11.5–15.5)
WBC: 16.5 10*3/uL — ABNORMAL HIGH (ref 4.0–10.5)
nRBC: 0 % (ref 0.0–0.2)

## 2019-12-07 LAB — RENAL FUNCTION PANEL
Albumin: 2 g/dL — ABNORMAL LOW (ref 3.5–5.0)
Anion gap: 13 (ref 5–15)
BUN: 54 mg/dL — ABNORMAL HIGH (ref 8–23)
CO2: 21 mmol/L — ABNORMAL LOW (ref 22–32)
Calcium: 8.2 mg/dL — ABNORMAL LOW (ref 8.9–10.3)
Chloride: 106 mmol/L (ref 98–111)
Creatinine, Ser: 1.12 mg/dL — ABNORMAL HIGH (ref 0.44–1.00)
GFR calc Af Amer: 52 mL/min — ABNORMAL LOW (ref >60)
GFR calc non Af Amer: 45 mL/min — ABNORMAL LOW (ref >60)
Glucose, Bld: 116 mg/dL — ABNORMAL HIGH (ref 70–99)
Phosphorus: 4.9 mg/dL — ABNORMAL HIGH (ref 2.5–4.6)
Potassium: 4.3 mmol/L (ref 3.5–5.1)
Sodium: 140 mmol/L (ref 135–145)

## 2019-12-07 LAB — GLUCOSE, CAPILLARY
Glucose-Capillary: 105 mg/dL — ABNORMAL HIGH (ref 70–99)
Glucose-Capillary: 124 mg/dL — ABNORMAL HIGH (ref 70–99)
Glucose-Capillary: 127 mg/dL — ABNORMAL HIGH (ref 70–99)
Glucose-Capillary: 155 mg/dL — ABNORMAL HIGH (ref 70–99)
Glucose-Capillary: 188 mg/dL — ABNORMAL HIGH (ref 70–99)
Glucose-Capillary: 82 mg/dL (ref 70–99)
Glucose-Capillary: 88 mg/dL (ref 70–99)

## 2019-12-07 LAB — SARS CORONAVIRUS 2 BY RT PCR (HOSPITAL ORDER, PERFORMED IN ~~LOC~~ HOSPITAL LAB): SARS Coronavirus 2: NEGATIVE

## 2019-12-07 MED ORDER — SODIUM CHLORIDE 0.9 % IV BOLUS
500.0000 mL | Freq: Once | INTRAVENOUS | Status: AC
  Administered 2019-12-07: 500 mL via INTRAVENOUS

## 2019-12-07 MED ORDER — ALBUMIN HUMAN 5 % IV SOLN
12.5000 g | Freq: Once | INTRAVENOUS | Status: AC
  Administered 2019-12-07: 12.5 g via INTRAVENOUS
  Filled 2019-12-07: qty 250

## 2019-12-07 MED ORDER — SODIUM CHLORIDE 0.9 % IV BOLUS
1000.0000 mL | Freq: Once | INTRAVENOUS | Status: AC
  Administered 2019-12-07: 1000 mL via INTRAVENOUS

## 2019-12-07 NOTE — TOC Progression Note (Signed)
Transition of Care [facility]) - Progression Note    Patient Details  Name: [name] MRN: [id number] Date of Birth: 17-Aug-1931  Transition of Care [facility]) CM/SW [place], [place] Phone Number: 12/07/2019, 11:19 AM  Clinical Narrative:     CSW spoke with patient's spouse,Walter. CSW introduced self and explained role. Thayer Jew confirmed he remains agreeable to SNF at [facility].   CSW contacted [facility]([place]), advised of possible discharge tomorrow pending lab results.  Per [employer] contact person tomorrow to discuss and confirm discharge plan is [place] # [phone].   Spoke with HTA/Inusrance- its best to start the authorization once the patient is medially ready to discharge.  [facility] /SNF- needs orders for tube feedings and supplies as soon as possible and before the patient is discharged to SNF. SNF states may take sometime to get tube feedings,etc, set up for the patient.  DNR  Placed on patient's chart needs MD signature FL-2 needs MD to co-sign in Epic  SNF needs orders for tube feedings Patient will need covid test  before discharged to SNF  CSW will continue to follow and assist with discharge planning.  Thurmond Butts, MSW, [place] Clinical Social Worker    Expected Discharge Plan: Skilled Nursing Facility Barriers to Discharge: Continued Medical Work up  Expected Discharge Plan and Services Expected Discharge Plan: [place] In-house Referral: Clinical Social Work     Living arrangements for the past 2 months: [place]                                       Social Determinants of Health (SDOH) Interventions    Readmission Risk Interventions No flowsheet data found.

## 2019-12-07 NOTE — Progress Notes (Signed)
Physical Therapy Treatment Patient Details Name: [name] MRN: [id number] DOB: [birth year] Today's Date: 12/07/2019    History of Present Illness 86 yo female admitted to ED on 8/10 for fall, progressive confusion, now nonverbal. CT head demonstrates bilateral parafalacine L>R acute subdural hematomas with brain compression but basal cisterns patent. PMH includes BPPV, CAD s/p MI, CKD III, DM, dyslipidemia, memory changes, osteoporosis.    PT Comments    Patient unable to participate with PT for in bed and EOB mobility this session.  Spoke with daughter in law regarding pt not progressing and given prognosis likely not to progress.  She agrees with PT signing off.  Recommend nursing for ROM and positioning.  No further skilled PT needs at this time.  Will sign off.    Follow Up Recommendations  SNF;Supervision/Assistance - 24 hour     Equipment Recommendations  None recommended by PT    Recommendations for Other Services       Precautions / Restrictions Precautions Precautions: Fall Precaution Comments: watch BP    Mobility  Bed Mobility Overal bed mobility: Needs Assistance Bed Mobility: Rolling Rolling: Total assist Sidelying to sit: Total assist   Sit to supine: Total assist   General bed mobility comments: not participating in rolling in bed for hygiene even with cues and increased time; side to sit to EOB pt with decreased respirations so returned to supine and scooted up in bed; BP 99/65.  Transfers                    Ambulation/Gait                 Stairs             Wheelchair Mobility    Modified Rankin (Stroke Patients Only)       Balance   Sitting-balance support: Feet unsupported Sitting balance-Leahy Scale: Zero Sitting balance - Comments: total A for brief sitting EOB                                    Cognition Arousal/Alertness: Lethargic Behavior During Therapy: Flat affect Overall Cognitive  Status: Difficult to assess                                        Exercises Other Exercises Other Exercises: PROM ankles bilateral in supine    General Comments General comments (skin integrity, edema, etc.): pt opened eyes briefly during hygiene as soiled in bed initially; daughter in law in room and reports plans for back to [facility] tomorrow if WBC better then pt's spouse can make decisions; she agreed with discontinuing PT due to pt unable to participate      Pertinent Vitals/Pain Pain Assessment: Faces Faces Pain Scale: Hurts little more Pain Location: grimacing with mobility Pain Descriptors / Indicators: Grimacing Pain Intervention(s): Monitored during session;Limited activity within patient's tolerance;Repositioned    Home Living                      Prior Function            PT Goals (current goals can now be found in the care plan section) Progress towards PT goals: Not progressing toward goals - comment    Frequency    Min 2X/week  PT Plan Current plan remains appropriate    Co-evaluation              AM-PAC PT "6 Clicks" Mobility   Outcome Measure  Help needed turning from your back to your side while in a flat bed without using bedrails?: Total Help needed moving from lying on your back to sitting on the side of a flat bed without using bedrails?: Total Help needed moving to and from a bed to a chair (including a wheelchair)?: Total Help needed standing up from a chair using your arms (e.g., wheelchair or bedside chair)?: Total Help needed to walk in hospital room?: Total Help needed climbing 3-5 steps with a railing? : Total 6 Click Score: 6    End of Session   Activity Tolerance: Patient limited by fatigue;Patient limited by lethargy Patient left: in bed;with call bell/phone within reach;with bed alarm set;with family/visitor present   PT Visit Diagnosis: Muscle weakness (generalized) (M62.81);Other abnormalities of  gait and mobility (R26.89);Other symptoms and signs involving the nervous system (R29.898)     Time: 5997-7414 PT Time Calculation (min) (ACUTE ONLY): 20 min  Charges:  $Therapeutic Activity: 8-22 mins                     Magda Kiel, PT Acute Rehabilitation Services Pager:[phone] Office:[phone] 12/07/2019    [name] 12/07/2019, 2:27 PM

## 2019-12-07 NOTE — Progress Notes (Signed)
Patient was seen for hypotension.   Chart was reviewed, course discussed with RN at bedside, and situation discussed with family at bedside. Patient's husband of 32 years, 2 sons, and a daughter-in-law are present, understand that [name]' prognosis is very poor, and note that the patient would not want any heroic measures or to be put on life-support. Patient's family hopes that she might be able to pull through this but also do not want her to suffer. She does not appear to be in pain or anxious at this time. We are planning to continue IVF resuscitation for now but if she continues to decline, focus on comfort only.

## 2019-12-07 NOTE — TOC Progression Note (Signed)
Transition of Care [facility]) - Progression Note    Patient Details  Name: [name] MRN: [id number] Date of Birth: 27-Sep-1931  Transition of Care [facility]) CM/SW [place], [place] Phone Number: 12/07/2019, 10:23 AM  Clinical Narrative:     CSW called [place], left voice message with Luetta Nutting to contact CSW to discuss and confrim discharge plans.  Thurmond Butts, MSW, [place] Clinical Social Worker   Expected Discharge Plan: Skilled Nursing Facility Barriers to Discharge: Continued Medical Work up  Expected Discharge Plan and Services Expected Discharge Plan: [place] arrangements for the past 2 months: [place]                                       Social Determinants of Health (SDOH) Interventions    Readmission Risk Interventions No flowsheet data found.

## 2019-12-07 NOTE — Progress Notes (Signed)
PROGRESS NOTE    [name]  NOM:[id number] DOB: 10-04-1931 DOA: 11/22/2019 PCP: [name], MD   Brief Narrative:  [name] is an 86 y.o. female with past medical history of hypertension, coronary artery disease, type 2 diabetes mellitus, GERD, left carotid artery stenosis, chronic dizziness, frequent falls, chronic diastolic CHF with preserved ejection fraction, dementia admitted by neurosurgery with subdural hematoma status post fall on 11/22/2019.  Patient underwent PEG tube placement this morning by IR.  Patient is nonverbal at baseline.  She developed fever of 100.6 with leukocytosis of 19.6, chest x-ray was performed which showed worsening left basilar opacities.  UA, UC, BC: Ordered and patient started on Unasyn.  [facility] hospitalist consulted for management of possible aspiration pneumonia.  History gathered from charts and RN who is taking care of the patient.  Upon my evaluation: Patient resting comfortably on the bed.  Sleepy, not arousable with verbal command, nonverbal, on 2 L of oxygen via nasal cannula, not in respiratory distress. RN reported no change in patient's mental status & she is on 2 L for long time.   Of note: During this hospitalization patient became bradycardic and [facility] hospitalist was consulted on 11/25/2019 for management of sinus bradycardia.  Patient's clonidine was discontinued and was replaced with minoxidil after discussing with cardiology.  Currently her heart rate is within normal limits (80s).  Assessment & Plan:   Principal Problem:   Subdural hematoma (HCC) Active Problems:   DM (diabetes mellitus) ([place])   CAD (coronary artery disease)   HTN (hypertension)   Hyperlipidemia   GERD (gastroesophageal reflux disease)   CKD (chronic kidney disease), stage III   Bradycardia   Protein-calorie malnutrition, severe   Aspiration pneumonia (HCC)   Sepsis 2/2 Aspiration pneumonia: -Patient had fever of 100.6 with leukocytosis of 19.6 on  the morning of 12/06/2019.  Chest x-ray concerning for worsening left basilar opacities.  On 2 L of oxygen via nasal cannula-chronic.  Had another fever of 100.4 early this morning.  Leukocytosis improved.  Continue Zosyn.  Thrombocytosis: Likely reactive.  CKD stage IIIa: Stable -Continue to monitor.  Hypertension: Stable -Continue hydralazine, labetalol, minoxidil  Type 2 diabetes mellitus: Continue sliding scale insulin and Lantus 20 units daily  Hyperlipidemia: Continue statin  Coronary artery disease: Continue nitro as needed, statin, labetalol  Seizure prophylaxis: Continue valproate, Keppra  Subdural hematoma: -Status post fall. -Managed by neurosurgery/primary team.  Chronic diastolic CHF: Patient appears euvolemic on exam -Reviewed echo from 02/16/2019-ejection fraction of 55 to 60% with grade 1 diastolic dysfunction.  Moderate aortic valve stenosis. -Continue statin, Lasix 3 times per week.  Strict INO's and daily weight -Monitor signs of fluid overload.  Dysphagia/poor p.o. intake: -S/p PEG tube placement on 12/06/2019 by IR.  DVT prophylaxis: heparin injection 5,000 Units Start: 12/07/19 0600 SCDs Start: 11/22/19 1958 Place TED hose Start: 11/22/19 1958   Code Status: DNR  Family Communication: Daughter-in-law present at bedside.   Status is: Inpatient  Remains inpatient appropriate because:Inpatient level of care appropriate due to severity of illness   Dispo: The patient is from: Home              Anticipated d/c is to: SNF              Anticipated d/c date is: 1 day              Patient currently is medically stable to d/c.        Estimated body mass index is  26.65 kg/m as calculated from the following:   Height as of this encounter: 5' (1.524 m).   Weight as of this encounter: 61.9 kg.      Nutritional status:  Nutrition Problem: Severe Malnutrition Etiology: chronic illness (CAD, CKD, DM)   Signs/Symptoms: severe fat depletion,  severe muscle depletion   Interventions: Tube feeding    Consultants:   TRH  Procedures:     Antimicrobials:  Anti-infectives (From admission, onward)   Start     Dose/Rate Route Frequency Ordered Stop   12/06/19 1800  piperacillin-tazobactam (ZOSYN) IVPB 2.25 g  Status:  Discontinued        2.25 g 100 mL/hr over 30 Minutes Intravenous Every 6 hours 12/06/19 1609 12/06/19 1629   12/06/19 1700  vancomycin (VANCOREADY) IVPB 750 mg/150 mL  Status:  Discontinued        750 mg 150 mL/hr over 60 Minutes Intravenous Every 24 hours 12/06/19 1609 12/06/19 1629   12/06/19 1700  metroNIDAZOLE (FLAGYL) IVPB 500 mg  Status:  Discontinued        500 mg 100 mL/hr over 60 Minutes Intravenous Every 8 hours 12/06/19 1609 12/06/19 1629   12/06/19 1700  Ampicillin-Sulbactam (UNASYN) 3 g in sodium chloride 0.9 % 100 mL IVPB        3 g 200 mL/hr over 30 Minutes Intravenous Every 12 hours 12/06/19 1630     12/06/19 1230  ceFAZolin (ANCEF) 2-4 GM/100ML-% IVPB       Note to Pharmacy: Arlean Hopping   : cabinet override      12/06/19 1230 12/07/19 0044   12/06/19 0700  ceFAZolin (ANCEF) IVPB 2g/100 mL premix        2 g 200 mL/hr over 30 Minutes Intravenous To Radiology 12/06/19 0612 12/06/19 1309         Subjective: Seen and examined.  Daughter-in-law at the bedside.  Patient remains very lethargic, very hard for her to open her eyes remain.  Nonverbal.  Looks comfortable.  Objective: Vitals:   12/07/19 0755 12/07/19 0925 12/07/19 1038 12/07/19 1214  BP: (!) 103/51 122/74 139/71 118/61  Pulse: 88 91  97  Resp: 20 20  (!) 21  Temp: 99.8 F (37.7 C)   99.2 F (37.3 C)  TempSrc: Axillary   Oral  SpO2: 98% 98%  97%  Weight:      Height:        Intake/Output Summary (Last 24 hours) at 12/07/2019 1236 Last data filed at 12/07/2019 0900 Gross per 24 hour  Intake 3063.93 ml  Output --  Net 3063.93 ml   Filed Weights   12/05/19 0310 12/06/19 0500 12/07/19 0423  Weight: 62.8 kg 63.9 kg  61.9 kg    Examination:  General exam: Appears comfortable but very lethargic. Respiratory system: Diminished breath sounds at the bases bilaterally. Respiratory effort normal. Cardiovascular system: S1 & S2 heard, RRR. No JVD, murmurs, rubs, gallops or clicks. No pedal edema. Gastrointestinal system: Abdomen is nondistended, soft and nontender. No organomegaly or masses felt. Normal bowel sounds heard.  PEG tube in place    Data Reviewed: I have personally reviewed following labs and imaging studies  CBC: Recent Labs  Lab 12/01/19 0827 12/06/19 0604 12/07/19 1026  WBC 10.0 19.6* 16.5*  NEUTROABS 8.1*  --  14.3*  HGB 7.7* 8.3* 9.0*  HCT 24.7* 27.1* 28.2*  MCV 88.8 88.3 88.7  PLT 262 452* 948*   Basic Metabolic Panel: Recent Labs  Lab 12/03/19 0451 12/04/19 0606 12/05/19 0706 12/06/19  0604 12/06/19 1646 12/07/19 1026  NA 139 141 140 139  --  140  K 4.6 4.9 5.6* 4.8  --  4.3  CL 104 107 105 103  --  106  CO2 25 25 22 25   --  21*  GLUCOSE 161* 134* 207* 76  --  116*  BUN 47* 49* 59* 59*  --  54*  CREATININE 1.08* 1.07* 1.30* 1.10*  --  1.12*  CALCIUM 8.9 9.0 8.8* 8.9  --  8.2*  MG  --   --   --   --  2.3  --   PHOS 5.3* 5.2* 5.8* 5.6*  --  4.9*   GFR: Estimated Creatinine Clearance: 30.2 mL/min (A) (by C-G formula based on SCr of 1.12 mg/dL (H)). Liver Function Tests: Recent Labs  Lab 12/03/19 0451 12/04/19 0606 12/05/19 0706 12/06/19 0604 12/07/19 1026  ALBUMIN 2.0* 2.0* 2.0* 1.9* 2.0*   No results for input(s): LIPASE, AMYLASE in the last 168 hours. No results for input(s): AMMONIA in the last 168 hours. Coagulation Profile: Recent Labs  Lab 12/06/19 0604  INR 1.1   Cardiac Enzymes: No results for input(s): CKTOTAL, CKMB, CKMBINDEX, TROPONINI in the last 168 hours. BNP (last 3 results) Recent Labs    02/25/19 1339 03/08/19 1422 03/25/19 1554  PROBNP 5,548* 5,028* 3,715*   HbA1C: No results for input(s): HGBA1C in the last 72  hours. CBG: Recent Labs  Lab 12/06/19 1941 12/07/19 0001 12/07/19 0341 12/07/19 0755 12/07/19 1216  GLUCAP 81 82 88 105* 124*   Lipid Profile: No results for input(s): CHOL, HDL, LDLCALC, TRIG, CHOLHDL, LDLDIRECT in the last 72 hours. Thyroid Function Tests: No results for input(s): TSH, T4TOTAL, FREET4, T3FREE, THYROIDAB in the last 72 hours. Anemia Panel: No results for input(s): VITAMINB12, FOLATE, FERRITIN, TIBC, IRON, RETICCTPCT in the last 72 hours. Sepsis Labs: Recent Labs  Lab 12/06/19 1646 12/06/19 1917  PROCALCITON 0.41  --   LATICACIDVEN 0.8 0.9    Recent Results (from the past 240 hour(s))  Culture, blood (routine x 2)     Status: None (Preliminary result)   Collection Time: 12/06/19  2:57 PM   Specimen: BLOOD LEFT HAND  Result Value Ref Range Status   Specimen Description BLOOD LEFT HAND  Final   Special Requests   Final    BOTTLES DRAWN AEROBIC AND ANAEROBIC Blood Culture adequate volume   Culture   Final    NO GROWTH < 24 HOURS Performed at [facility], [place] [address], [place], [place] [address]    Report Status PENDING  Incomplete  Culture, blood (routine x 2)     Status: None (Preliminary result)   Collection Time: 12/06/19  3:01 PM   Specimen: BLOOD  Result Value Ref Range Status   Specimen Description BLOOD RIGHT ANTECUBITAL  Final   Special Requests   Final    BOTTLES DRAWN AEROBIC ONLY Blood Culture results may not be optimal due to an inadequate volume of blood received in culture bottles   Culture   Final    NO GROWTH < 24 HOURS Performed at [facility], [place] [address] [place], [place] [address]    Report Status PENDING  Incomplete      Radiology Studies: DG Chest 1 View  Result Date: 12/06/2019 CLINICAL DATA:  Leukocytosis EXAM: CHEST  1 VIEW COMPARISON:  11/28/2019 FINDINGS: Grossly unchanged cardiac silhouette and mediastinal contours with atherosclerotic plaque within the thoracic aorta. Worsening left basilar  heterogeneous/consolidative opacities. Trace  bilateral effusions are not excluded. Pulmonary venous congestion without frank evidence of edema. No pneumothorax. No acute osseous abnormalities. Post lower cervical ACDF, incompletely evaluated. Post lumbar cement augmentation, incompletely evaluated. Vascular calcifications overlies expected location of the right carotid bulb. Interval removal of enteric tube. IMPRESSION: Worsening left basilar opacities, potentially atelectasis though conceivably infection and/or aspiration could have a similar appearance. Electronically Signed   By: Sandi Mariscal M.D.   On: 12/06/2019 13:59   IR GASTROSTOMY TUBE MOD SED  Result Date: 12/06/2019 INDICATION: Dysphagia. Please perform percutaneous gastrostomy tube placement for enteric nutrition supplementation purposes. EXAM: PULL TROUGH GASTROSTOMY TUBE PLACEMENT COMPARISON:  Abdominal CT-12/02/2019 MEDICATIONS: None, the patient is currently admitted to the hospital receiving intravenous antibiotics; Antibiotics were administered within 1 hour of the procedure. Glucagon 1 mg IV CONTRAST:  Fifteen mL of Omnipaque 300 administered into the gastric lumen. ANESTHESIA/SEDATION: None FLUOROSCOPY TIME:  2 minutes, 6 seconds (31 mGy) COMPLICATIONS: None immediate. PROCEDURE: Informed written consent was obtained from the patient's family following explanation of the procedure, risks, benefits and alternatives. A time out was performed prior to the initiation of the procedure. Ultrasound scanning was performed to demarcate the edge of the left lobe of the liver. Maximal barrier sterile technique utilized including caps, mask, sterile gowns, sterile gloves, large sterile drape, hand hygiene and Betadine prep. The left upper quadrant was sterilely prepped and draped. An oral gastric catheter was inserted into the stomach under fluoroscopy. The existing nasogastric feeding tube was removed. The left costal margin and air opacified transverse  colon were identified and avoided. Air was injected into the stomach for insufflation and visualization under fluoroscopy. Under sterile conditions a 17 gauge trocar needle was utilized to access the stomach percutaneously beneath the left subcostal margin after the overlying soft tissues were anesthetized with 1% Lidocaine with epinephrine. Needle position was confirmed within the stomach with aspiration of air and injection of small amount of contrast. A single T tack was deployed for gastropexy. Over an Amplatz guide wire, a 9-French sheath was inserted into the stomach. A snare device was utilized to capture the oral gastric catheter. The snare device was pulled retrograde from the stomach up the esophagus and out the oropharynx. The 20-French pull-through gastrostomy was connected to the snare device and pulled antegrade through the oropharynx down the esophagus into the stomach and then through the percutaneous tract external to the patient. The gastrostomy was assembled externally. Contrast injection confirms position in the stomach. Several spot radiographic images were obtained in various obliquities for documentation. The patient tolerated procedure well without immediate post procedural complication. FINDINGS: After successful fluoroscopic guided placement, the gastrostomy tube is appropriately positioned with internal disc against the ventral aspect of the gastric lumen. IMPRESSION: Successful fluoroscopic insertion of a 20-French pull-through gastrostomy tube. The gastrostomy may be used immediately for medication administration and in 24 hrs for the initiation of feeds. Electronically Signed   By: Sandi Mariscal M.D.   On: 12/06/2019 13:58    Scheduled Meds:  azelastine  1 spray Each Nare BID   chlorhexidine  15 mL Mouth Rinse BID   Chlorhexidine Gluconate Cloth  6 each Topical Q0600   docusate  100 mg Per Tube BID   furosemide  20 mg Per Tube Once per day on Mon Wed Fri   heparin injection  (subcutaneous)  5,000 Units Subcutaneous Q8H   hydrALAZINE  50 mg Per Tube Q8H   insulin aspart  0-9 Units Subcutaneous Q4H   insulin aspart  3 Units Subcutaneous Q4H   insulin glargine  20 Units Subcutaneous Daily   labetalol  100 mg Per Tube Q6H   minoxidil  5 mg Per Tube Daily   simvastatin  20 mg Per Tube Q M,W,F   Continuous Infusions:  sodium chloride 75 mL/hr at 12/07/19 0725   ampicillin-sulbactam (UNASYN) IV 3 g (12/07/19 0422)   clevidipine Stopped (11/28/19 1110)   feeding supplement (OSMOLITE 1.2 CAL) Stopped (12/05/19 2357)   levETIRAcetam 1,000 mg (12/07/19 1047)   valproate sodium 250 mg (12/07/19 0600)     LOS: 15 days   Time spent: 30 minutes   Darliss Cheney, MD [facility]  12/07/2019, 12:36 PM   To contact the attending provider between 7A-7P or the covering provider during after hours 7P-7A, please log into the web site [email or web address].

## 2019-12-07 NOTE — Progress Notes (Signed)
IR.  History of dysphasia secondary to [facility] s/p percutaneous gastrostomy tube placement in IR 12/06/2019 by Dr. Pascal Lux.  Went to evaluate gastrostomy tube bedside. Gastrostomy tube site without erythema, active bleeding, or drainage. Bumper cinched to skin.  Gastrostomy tube ready for use- please ensure bumper cinched to skin to prevent leakage. Please call IR with questions/concerns.   Bea Graff Lazette Estala, PA-C 12/07/2019, 3:51 PM

## 2019-12-07 NOTE — Progress Notes (Signed)
SLP Cancellation Note  Patient Details Name: PORCHEA CHARRIER MRN: [id number] DOB: Feb 03, 1932   Cancelled treatment:       Reason Eval/Treat Not Completed: Fatigue/lethargy limiting ability to participate;Patient's level of consciousness  SLP attempted therapeutic PO trials. Unfortunately, pt was unable to arouse for PO intake despite attempts. Daughter in room reporting they plan to d/c to SNF either later today or tomorrow.  Axavier Pressley P. Jessyka Austria, M.S., CCC-SLP Speech-Language Pathologist Acute Rehabilitation Services Pager: [place] 12/07/2019, 2:02 PM

## 2019-12-07 NOTE — Progress Notes (Signed)
   Providing Compassionate, Quality Care - Together  NEUROSURGERY PROGRESS NOTE   S: No issues overnight. Yesterday had leukocytosis, CXR conerning for PNA. Afebrile this am  O: EXAM:  BP 122/74 (BP Location: Left Arm)   Pulse 91   Temp 99.8 F (37.7 C) (Axillary)   Resp 20   Ht 5' (1.524 m)   Wt 61.9 kg   SpO2 98%   BMI 26.65 kg/m   Eyes open to voice PERRL Not FCs WD briskly everything to pain Winces to pain  ASSESSMENT:  86 y.o. female with  1. SDH, parafalcine, acute 2. Szs 3. PNA  PLAN: -continue supportive therapy - sz control per neurology - dc planning,peg planned today, SNF placement afterwards, hopefully tomorrow - therapy as tolerated - scds -abx  -repeating am labs, dc pending leukocytosis work up  Thank you for allowing me to participate in this patient's care.  Please do not hesitate to call with questions or concerns.   Elwin Sleight, [facility] Cell: [phone]

## 2019-12-07 NOTE — Progress Notes (Signed)
Patient SBP dropped to 70's with DBP in 40's. Dr. Doristine Bosworth, [place] paged and made aware. Order received. Also called Son Sherren Mocha and notified of change in patient's vital signs. Requested if Spouse can visit. Allowed.

## 2019-12-08 DIAGNOSIS — Z7189 Other specified counseling: Secondary | ICD-10-CM

## 2019-12-08 DIAGNOSIS — Z515 Encounter for palliative care: Secondary | ICD-10-CM

## 2019-12-08 LAB — RENAL FUNCTION PANEL
Albumin: 1.9 g/dL — ABNORMAL LOW (ref 3.5–5.0)
Anion gap: 9 (ref 5–15)
BUN: 51 mg/dL — ABNORMAL HIGH (ref 8–23)
CO2: 22 mmol/L (ref 22–32)
Calcium: 8 mg/dL — ABNORMAL LOW (ref 8.9–10.3)
Chloride: 109 mmol/L (ref 98–111)
Creatinine, Ser: 0.99 mg/dL (ref 0.44–1.00)
GFR calc Af Amer: >60 mL/min (ref >60)
GFR calc non Af Amer: 52 mL/min — ABNORMAL LOW (ref >60)
Glucose, Bld: 212 mg/dL — ABNORMAL HIGH (ref 70–99)
Phosphorus: 4 mg/dL (ref 2.5–4.6)
Potassium: 3.7 mmol/L (ref 3.5–5.1)
Sodium: 140 mmol/L (ref 135–145)

## 2019-12-08 LAB — CBC
HCT: 24.1 % — ABNORMAL LOW (ref 36.0–46.0)
Hemoglobin: 7.5 g/dL — ABNORMAL LOW (ref 12.0–15.0)
MCH: 28.3 pg (ref 26.0–34.0)
MCHC: 31.1 g/dL (ref 30.0–36.0)
MCV: 90.9 fL (ref 80.0–100.0)
Platelets: 376 10*3/uL (ref 150–400)
RBC: 2.65 MIL/uL — ABNORMAL LOW (ref 3.87–5.11)
RDW: 13.5 % (ref 11.5–15.5)
WBC: 13.2 10*3/uL — ABNORMAL HIGH (ref 4.0–10.5)
nRBC: 0 % (ref 0.0–0.2)

## 2019-12-08 LAB — GLUCOSE, CAPILLARY
Glucose-Capillary: 190 mg/dL — ABNORMAL HIGH (ref 70–99)
Glucose-Capillary: 199 mg/dL — ABNORMAL HIGH (ref 70–99)

## 2019-12-08 MED ORDER — LORAZEPAM 2 MG/ML PO CONC: 2.0000 mg | Freq: Four times a day (QID) | ORAL | Status: DC | PRN

## 2019-12-08 MED ORDER — OXYCODONE HCL 5 MG/5ML PO SOLN
5.0000 mg | ORAL | 0 refills | Status: AC | PRN
Start: 2019-12-08 — End: ?

## 2019-12-08 MED ORDER — PROMETHAZINE HCL 12.5 MG PO TABS: 12.5000 mg | ORAL_TABLET | ORAL | 0 refills | Status: AC | PRN

## 2019-12-08 MED ORDER — VALPROIC ACID 250 MG/5ML PO SOLN: 250.0000 mg | Freq: Three times a day (TID) | ORAL | 0 refills | Status: DC

## 2019-12-08 MED ORDER — GLYCOPYRROLATE 0.2 MG/ML IJ SOLN: 0.2000 mg | INTRAMUSCULAR | Status: DC | PRN

## 2019-12-08 MED ORDER — BIOTENE DRY MOUTH MT LIQD: 15.0000 mL | OROMUCOSAL | Status: DC | PRN

## 2019-12-08 MED ORDER — OXYCODONE HCL 5 MG/5ML PO SOLN: 5.0000 mg | ORAL | Status: DC | PRN

## 2019-12-08 MED ORDER — LORAZEPAM 2 MG/ML PO CONC: 2.0000 mg | Freq: Four times a day (QID) | ORAL | 0 refills | Status: AC | PRN

## 2019-12-08 MED ORDER — GLYCOPYRROLATE 1 MG PO TABS: 1.0000 mg | ORAL_TABLET | ORAL | 0 refills | Status: AC | PRN

## 2019-12-08 MED ORDER — SODIUM CHLORIDE 0.9 % IV SOLN
1.5000 g | Freq: Three times a day (TID) | INTRAVENOUS | Status: DC
  Administered 2019-12-08: 1.5 g via INTRAVENOUS
  Filled 2019-12-08: qty 4
  Filled 2019-12-08: qty 1.5

## 2019-12-08 MED ORDER — LEVETIRACETAM 100 MG/ML PO SOLN: 1000.0000 mg | Freq: Two times a day (BID) | ORAL | 12 refills | Status: AC

## 2019-12-08 MED ORDER — GLYCOPYRROLATE 1 MG PO TABS: 1.0000 mg | ORAL_TABLET | ORAL | Status: DC | PRN

## 2019-12-08 MED ORDER — POLYVINYL ALCOHOL 1.4 % OP SOLN
1.0000 [drp] | Freq: Four times a day (QID) | OPHTHALMIC | Status: DC | PRN
  Filled 2019-12-08: qty 15

## 2019-12-08 MED ORDER — VALPROIC ACID 250 MG/5ML PO SOLN
250.0000 mg | Freq: Three times a day (TID) | ORAL | Status: DC
  Administered 2019-12-08: 250 mg
  Filled 2019-12-08: qty 5

## 2019-12-08 MED ORDER — VALPROIC ACID 250 MG/5ML PO SOLN: 250.0000 mg | Freq: Three times a day (TID) | ORAL | 0 refills | Status: AC

## 2019-12-08 MED ORDER — LEVETIRACETAM 100 MG/ML PO SOLN: 1000.0000 mg | Freq: Two times a day (BID) | ORAL | 12 refills | Status: DC

## 2019-12-08 MED ORDER — PROMETHAZINE HCL 25 MG PO TABS: 12.5000 mg | ORAL_TABLET | ORAL | Status: DC | PRN

## 2019-12-08 MED ORDER — LEVETIRACETAM 100 MG/ML PO SOLN: 1000.0000 mg | Freq: Two times a day (BID) | ORAL | Status: DC

## 2019-12-08 NOTE — Discharge Summary (Signed)
Physician Discharge Summary  Patient ID: [name] MRN: [id number] DOB/AGE: 16-May-1931 86 y.o.  Admit date: 11/22/2019 Discharge date: 12/08/2019  Admission Diagnoses:  1.  Acute subdural hematoma secondary to fall 2.  Altered mental status  Discharge Diagnoses:  1.  Acute subdural hematoma secondary to fall 2.  Altered mental status 3.  Seizures 4.  PNA Principal Problem:   Subdural hematoma (HCC) Active Problems:   DM (diabetes mellitus) (HCC)   CAD (coronary artery disease)   HTN (hypertension)   Hyperlipidemia   GERD (gastroesophageal reflux disease)   CKD (chronic kidney disease), stage III   Bradycardia   Protein-calorie malnutrition, severe   Aspiration pneumonia ([place])   Discharged Condition: guarded, poor, transition to hospice  Hospital Course:  [name] is a 86 y.o. female that presented after a ground-level fall on Plavix.  She was found to have a large interhemispheric parafalcine subdural hematoma with mass-effect.  She is DNR per her request.  Neurologically she declined after multiple seizure episodes in which neurology was consulted and she was put on 2 antiepileptic therapies.  Her neurologic status remained poor as she was not interactive but she was protecting her airway.  She made no significant neurologic recovery during the hospitalization.  She had multiple CT scans that showed her hematoma to be stable.  Her seizures were controlled on medications.  She had failure to thrive and therefore PEG tube was placed.  She was then found to have pneumonia in which antibiotics were started.  At this time she began having labile blood pressures and low-grade fevers.  Family was urgently requesting getting her to her facility to be with her husband who has end-stage pulmonary fibrosis so they could spend their final time together.  The hospitalist team was on consult for bradycardia as well as pneumonia treatment.  Palliative care was consulted due to her  continued decline and request to transition to hospice at [facility] facility[facility] [place], where her husband also resides.  Palliative care helped transition her to hospice approach.   Discharge Exam: Blood pressure 107/64, pulse 96, temperature 99.8 F (37.7 C), temperature source Axillary, resp. rate (!) 24, height 5' (1.524 m), weight 62 kg, SpO2 98 %. Eyes closed minimally open to pain Pupils equally round reactive to light Face symmetric Withdraws x4 to pain Slightly labored breathing  Disposition: Discharge disposition: 86-Hospice/Medical Facility        Allergies as of 12/08/2019      Reactions   Fentanyl Anaphylaxis   Cardiac arrest and coded   Fish Allergy Anaphylaxis, Rash   Morphine And Related Anaphylaxis   Cardiac arrest and coded Per Dr. Conley Canal patient has taken percocet without issue   Singulair [montelukast] Palpitations, Other (See Comments)   Increased BP and HR   Actonel [risedronate Sodium] Rash, Other (See Comments)   weakness   Dilaudid [hydromorphone Hcl] Rash, Other (See Comments)   Crying and screaming   Fosamax [alendronate Sodium] Rash, Other (See Comments)   Weakness and myalgias   Hydralazine Diarrhea, Other (See Comments)   Swelling in stomach   Lipitor [atorvastatin] Swelling, Rash   Tongue swelling   Lisinopril Diarrhea, Rash, Cough   Septra [sulfamethoxazole-trimethoprim] Nausea And Vomiting, Rash   Tekturna [aliskiren] Swelling, Rash   Swelling of tongue   Tricor [fenofibrate] Rash, Other (See Comments)   Flu symptoms   Imdur [isosorbide Dinitrate] Other (See Comments)   Headaches and blindness   Amlodipine Diarrhea   Avapro [irbesartan] Rash  Cardio Complete [nutritional Supplements] Rash   Cardizem [diltiazem Hcl] Rash   Ciprofloxacin Rash, Other (See Comments)   Black spot on body   Codeine Rash   Cyclobenzaprine Nausea Only   Forteo [parathyroid Hormone (recomb)] Rash   Inapsine [droperidol] Rash   Ivp Dye [iodinated  Diagnostic Agents] Rash   Shellfish Allergy Rash   Tussionex Pennkinetic Er [hydrocod Polst-cpm Polst Er] Itching, Rash      Medication List    STOP taking these medications   acetaminophen 500 MG tablet Commonly known as: TYLENOL   aspirin 81 MG tablet   azelastine 0.1 % nasal spray Commonly known as: ASTELIN   betamethasone dipropionate 0.05 % cream   cholecalciferol 1000 units tablet Commonly known as: VITAMIN D   cloNIDine 0.1 MG tablet Commonly known as: CATAPRES   clopidogrel 75 MG tablet Commonly known as: PLAVIX   denosumab 60 MG/ML Soln injection Commonly known as: PROLIA   docusate sodium 100 MG capsule Commonly known as: COLACE   ferrous sulfate 325 (65 FE) MG tablet Commonly known as: FerrouSul   fexofenadine 180 MG tablet Commonly known as: ALLEGRA   furosemide 20 MG tablet Commonly known as: LASIX   meclizine 25 MG tablet Commonly known as: ANTIVERT   metFORMIN 500 MG 24 hr tablet Commonly known as: GLUCOPHAGE-XR   methimazole 5 MG tablet Commonly known as: TAPAZOLE   nitroGLYCERIN 0.4 MG SL tablet Commonly known as: NITROSTAT   OneTouch Delica Lancets 14D Misc   OneTouch Ultra test strip Generic drug: glucose blood   pantoprazole 40 MG tablet Commonly known as: PROTONIX   PHENAZOPYRIDINE HCL PO   PROBIOTIC PO   simvastatin 20 MG tablet Commonly known as: ZOCOR   SYSTANE FREE OP   telmisartan 40 MG tablet Commonly known as: MICARDIS   Vitamin C 500 MG Chew     TAKE these medications   glycopyrrolate 1 MG tablet Commonly known as: ROBINUL Place 1 tablet (1 mg total) into feeding tube every 4 (four) hours as needed (excessive secretions).   levETIRAcetam 100 MG/ML solution Commonly known as: KEPPRA Place 10 mLs (1,000 mg total) into feeding tube 2 (two) times daily.   LORazepam 2 MG/ML concentrated solution Commonly known as: ATIVAN Place 1 mL (2 mg total) into feeding tube every 6 (six) hours as needed for anxiety or  seizure.   oxyCODONE 5 MG/5ML solution Commonly known as: ROXICODONE Place 5 mLs (5 mg total) into feeding tube every 3 (three) hours as needed for severe pain (sob).   promethazine 12.5 MG tablet Commonly known as: PHENERGAN Place 1-2 tablets (12.5-25 mg total) into feeding tube every 4 (four) hours as needed for refractory nausea / vomiting.   valproic acid 250 MG/5ML solution Commonly known as: DEPAKENE Place 5 mLs (250 mg total) into feeding tube every 8 (eight) hours.        SignedTheodoro Doing Jillann Charette 12/08/2019, 3:31 PM

## 2019-12-08 NOTE — TOC Transition Note (Signed)
Transition of Care [facility]) - CM/SW Discharge Note   Patient Details  Name: [name] MRN: [id number] Date of Birth: 09/06/1931  [facility]) CM/SW Contact:  Vinie Sill, [place] Phone Number: 12/08/2019, 4:11 PM   Clinical Narrative:     Patient will DC to: [place] Date: 12/08/2019 Family Notified: Manuela Schwartz, daughter in law Transport By: Corey Harold   Per MD patient is ready for discharge. RN, patient, and facility notified of DC. Discharge Summary sent to facility. RN given number for [phone]. Ambulance transport requested for patient.   Clinical Social Worker signing off. Thurmond Butts, MSW, [place] Clinical Social Worker   Final next level of care: Skilled Nursing Facility Barriers to Discharge: Barriers Resolved   Patient Goals and CMS Choice Patient states their goals for this hospitalization and ongoing recovery are:: pt was unable to express goals CMS Medicare.gov Compare Post Acute Care list provided to:: Patient Choice offered to / list presented to : Patient  Discharge Placement              Patient chooses bed at: [facility] Patient to be transferred to facility by: [facility]   Patient and family notified of of transfer: 12/08/19  Discharge Plan and Services In-house Referral: Clinical Social Work                                   Social Determinants of Health ([place]) Interventions     Readmission Risk Interventions No flowsheet data found.

## 2019-12-08 NOTE — TOC Progression Note (Signed)
Transition of Care [facility]) - Progression Note    Patient Details  Name: [name] MRN: [id number] Date of Birth: [birth year]  Transition of Care [facility]) CM/SW [place], [place] Phone Number: 12/08/2019, 11:41 AM  Clinical Narrative:     CSW called left voice message with [facility], to return call. Waiting on call back.  Thurmond Butts, MSW, [place] Clinical Social Worker   Expected Discharge Plan: Skilled Nursing Facility Barriers to Discharge: Barriers Resolved  Expected Discharge Plan and Services Expected Discharge Plan: [place] In-house Referral: Clinical Social Work     Living arrangements for the past 2 months: [place]                                       Social Determinants of Health (SDOH) Interventions    Readmission Risk Interventions No flowsheet data found.

## 2019-12-08 NOTE — Progress Notes (Addendum)
At the start of the shift, the patient was having low BPs, which was consistent with how they had been during the day. Albumin and a 1L NS bolus were administered per orders.The patient's family is present at the bedside, including her husband of 47 years, two sons, and daughter-in-law. The husband and one son have agreed to stay all night, while the other family members go home. This RN was present for a conversation with the family and MD regarding the patient's declining status. The family expressed that they want to honor her DNR status and ensure that she does not suffer.   0500: Patient's husband, [name], expressed desire to talk to Palliative Care Team today. Provider paged and this RN will share this information with day RN.

## 2019-12-08 NOTE — Progress Notes (Signed)
Pt discharged to [facility] facility. Report previously called to receiving RN. PIV left intact per request, AVS printed and sent with scripts, and belongings packed and sent.   Justice Rocher, RN

## 2019-12-08 NOTE — TOC Progression Note (Addendum)
Transition of Care [facility]) - Progression Note    Patient Details  Name: [name] MRN: [id number] Date of Birth: Nov 04, 1931  Transition of Care [facility]) CM/SW [place], [place] Phone Number: 12/08/2019, 12:26 PM  Clinical Narrative:     Update: Bevely Palmer- confirmed she received referral for Hospice.  CSW spoke with Jordan w/[facility]- she has submitted patients' clinicals to [facility] and is waiting on response to determine when and if she can be admitted.  CSW left voice message with Bevely Palmer w/ [facility]- for hospice referral- waiting on response.   Thurmond Butts, MSW, [place] Clinical Social Worker   Expected Discharge Plan: Skilled Nursing Facility Barriers to Discharge: Barriers Resolved  Expected Discharge Plan and Services Expected Discharge Plan: [place] In-house Referral: Clinical Social Work     Living arrangements for the past 2 months: [place]                                       Social Determinants of Health (SDOH) Interventions    Readmission Risk Interventions No flowsheet data found.

## 2019-12-08 NOTE — TOC Transition Note (Signed)
Transition of Care [facility]) - CM/SW Discharge Note   Patient Details  Name: [name] MRN: [id number] Date of Birth: 04-16-1931  Transition of Care [facility]) CM/SW Contact:  Vinie Sill, [place] Phone Number: 12/08/2019, 1:12 PM   Clinical Narrative:     CSW confirmed with the family change in disposition. Family requested referral to residential hospice, [facility].  Chrislyn w/[facility] has confirmed they are aware of changes.  CSW will continue to follow and assist with discharge planning.  Thurmond Butts, MSW, [place] Clinical Social Worker    Final next level of care: Skilled Nursing Facility Barriers to Discharge: Barriers Resolved   Patient Goals and CMS Choice Patient states their goals for this hospitalization and ongoing recovery are:: pt was unable to express goals CMS Medicare.gov Compare Post Acute Care list provided to:: Patient Choice offered to / list presented to : Patient  Discharge Placement              Patient [facility] bed at: [facility] Patient to be transferred to facility by: [facility]   Patient and family notified of of transfer: 12/08/19  Discharge Plan and Services In-house Referral: Clinical Social Work                                   Social Determinants of Health ([place]) Interventions     Readmission Risk Interventions No flowsheet data found.

## 2019-12-08 NOTE — Progress Notes (Addendum)
  Pharmacy Antibiotic Note  [name] is a 86 y.o. female admitted on 11/22/2019 with aspiration pneumonia.  Pharmacy has been consulted for ampicillin/sulbactam dosing.  Renal function is improving, Tmax 100.7, WBC 13.2.  Plan: Change Unasyn to 1.5gm IV Q8H Pharmacy will sign off.  Thank you for the consult!  Consider switching to Augmentin 500mg  PT BID for discharge, if wishes to continue   Height: 5' (152.4 cm) Weight: 62 kg (136 lb 11 oz) IBW/kg (Calculated) : 45.5  Temp (24hrs), Avg:100.2 F (37.9 C), Min:99.8 F (37.7 C), Max:100.7 F (38.2 C)  Recent Labs  Lab 12/04/19 0606 12/05/19 0706 12/06/19 0604 12/06/19 1646 12/06/19 1917 12/07/19 1026 12/08/19 0418  WBC  --   --  19.6*  --   --  16.5* 13.2*  CREATININE 1.07* 1.30* 1.10*  --   --  1.12* 0.99  LATICACIDVEN  --   --   --  0.8 0.9  --   --     Estimated Creatinine Clearance: 34.2 mL/min (by C-G formula based on SCr of 0.99 mg/dL).    Allergies  Allergen Reactions  . Fentanyl Anaphylaxis    Cardiac arrest and coded  . Fish Allergy Anaphylaxis and Rash  . Morphine And Related Anaphylaxis    Cardiac arrest and coded Per Dr. Conley Canal patient has taken percocet without issue  . Singulair [Montelukast] Palpitations and Other (See Comments)    Increased BP and HR  . Actonel [Risedronate Sodium] Rash and Other (See Comments)    weakness  . Dilaudid [Hydromorphone Hcl] Rash and Other (See Comments)    Crying and screaming  . Fosamax [Alendronate Sodium] Rash and Other (See Comments)    Weakness and myalgias  . Hydralazine Diarrhea and Other (See Comments)    Swelling in stomach  . Lipitor [Atorvastatin] Swelling and Rash    Tongue swelling   . Lisinopril Diarrhea, Rash and Cough  . Septra [Sulfamethoxazole-Trimethoprim] Nausea And Vomiting and Rash  . Tekturna [Aliskiren] Swelling and Rash    Swelling of tongue  . Tricor [Fenofibrate] Rash and Other (See Comments)    Flu symptoms  . Imdur  [Isosorbide Dinitrate] Other (See Comments)    Headaches and blindness  . Amlodipine Diarrhea  . Avapro [Irbesartan] Rash  . Cardio Complete [Nutritional Supplements] Rash  . Cardizem [Diltiazem Hcl] Rash  . Ciprofloxacin Rash and Other (See Comments)    Black spot on body  . Codeine Rash  . Cyclobenzaprine Nausea Only  . Forteo [Parathyroid Hormone (Recomb)] Rash  . Inapsine [Droperidol] Rash  . Ivp Dye [Iodinated Diagnostic Agents] Rash  . Shellfish Allergy Rash  . Tussionex Pennkinetic Er [Hydrocod Polst-Cpm Polst Er] Itching and Rash    Unasyn 8/24 >>  8/10 MRSA PCR - negative 8/24 BCx - NGTD  [name], PharmD, BCPS, [place] 12/08/2019, 1:44 PM

## 2019-12-08 NOTE — Progress Notes (Signed)
   Providing Compassionate, Quality Care - Together  NEUROSURGERY PROGRESS NOTE   S: had low BP, given fluids, low grade fever.  Family members at bedside  O: EXAM:  BP 107/64 (BP Location: Left Arm)   Pulse 96   Temp 99.8 F (37.7 C) (Axillary)   Resp (!) 24   Ht 5' (1.524 m)   Wt 62 kg   SpO2 98%   BMI 26.69 kg/m   Eyes open to pain perrl MAE to pain Labored breathing   ASSESSMENT:  86 y.o. female with   1. SDH, parafalcine, acute 2. Szs 3. PNA  PLAN: -Patient had labile blood pressure overnight requiring fluid resuscitation. Given this the family is concerned about getting her to her facility as soon as possible as we have been attempting over the past week.  She was able to have her husband and other family members come visit her at bedside and stay the night.  They requested palliative care consult and have transition her to hospice care at this point.  I am setting her up for discharge to hospice care at her nursing facility.  We are awaiting approval for a bed at this time.  Thank you for allowing me to participate in this patient's care.  Please do not hesitate to call with questions or concerns.   Elwin Sleight, [facility] Cell: [phone]

## 2019-12-08 NOTE — Progress Notes (Signed)
PROGRESS NOTE    [name]  PFX:[id number] DOB: May 10, 1931 DOA: 11/22/2019 PCP: [name], MD   Brief Narrative:  [name] is an 86 y.o. female with past medical history of hypertension, coronary artery disease, type 2 diabetes mellitus, GERD, left carotid artery stenosis, chronic dizziness, frequent falls, chronic diastolic CHF with preserved ejection fraction, dementia admitted by neurosurgery with subdural hematoma status post fall on 11/22/2019.  Patient underwent PEG tube placement this morning by IR.  Patient is nonverbal at baseline.  She developed fever of 100.6 with leukocytosis of 19.6, chest x-ray was performed which showed worsening left basilar opacities.  UA, UC, BC: Ordered and patient started on Unasyn.  [facility] hospitalist consulted for management of possible aspiration pneumonia.  History gathered from charts and RN who is taking care of the patient.  Upon my evaluation: Patient resting comfortably on the bed.  Sleepy, not arousable with verbal command, nonverbal, on 2 L of oxygen via nasal cannula, not in respiratory distress. RN reported no change in patient's mental status & she is on 2 L for long time.   Of note: During this hospitalization patient became bradycardic and [facility] hospitalist was consulted on 11/25/2019 for management of sinus bradycardia.  Patient's clonidine was discontinued and was replaced with minoxidil after discussing with cardiology.  Currently her heart rate is within normal limits (80s).  Assessment & Plan:   Principal Problem:   Subdural hematoma (HCC) Active Problems:   DM (diabetes mellitus) ([place])   CAD (coronary artery disease)   HTN (hypertension)   Hyperlipidemia   GERD (gastroesophageal reflux disease)   CKD (chronic kidney disease), stage III   Bradycardia   Protein-calorie malnutrition, severe   Aspiration pneumonia (HCC)   Sepsis 2/2 Aspiration pneumonia: -Patient had fever of 100.6 with leukocytosis of 19.6 on  the morning of 12/06/2019.  Chest x-ray concerning for worsening left basilar opacities.  On 2 L of oxygen via nasal cannula-chronic.  She remains on Zosyn however now family has chosen for hospice care.  Palliative care is on board.  Per my discussion with palliative care, no further antibiotics will be continued.  Thrombocytosis: Likely reactive.  CKD stage IIIa: Stable -Continue to monitor.  Hypertension: Stable -Continue hydralazine, labetalol, minoxidil  Type 2 diabetes mellitus: Continue sliding scale insulin and Lantus 20 units daily  Hyperlipidemia: Continue statin  Coronary artery disease: Continue nitro as needed, statin, labetalol  Seizure prophylaxis: Continue valproate, Keppra  Subdural hematoma: -Status post fall. -Managed by neurosurgery/primary team.  Chronic diastolic CHF: Patient appears euvolemic on exam -Reviewed echo from 02/16/2019-ejection fraction of 55 to 60% with grade 1 diastolic dysfunction.  Moderate aortic valve stenosis. -Continue statin, Lasix 3 times per week.  Strict INO's and daily weight -Monitor signs of fluid overload.  Dysphagia/poor p.o. intake: -S/p PEG tube placement on 12/06/2019 by IR.  Discussed with palliative care.  Patient is going to be hospice and likely will be discharged today to her previous SNF and continue as hospice.  Hospitalist service will sign off.  Patient to be discharged by primary service/neurosurgery.  Please reconsult if our services are needed.  DVT prophylaxis:    Code Status: DNR  Family Communication: Daughter-in-law, son and husband present at bedside.   Status is: Inpatient  Remains inpatient appropriate because:Inpatient level of care appropriate due to severity of illness   Dispo: The patient is from: Home              Anticipated d/c is to:  SNF              Anticipated d/c date is: 1 day              Patient currently is medically stable to d/c.        Estimated body mass index is  26.69 kg/m as calculated from the following:   Height as of this encounter: 5' (1.524 m).   Weight as of this encounter: 62 kg.      Nutritional status:  Nutrition Problem: Severe Malnutrition Etiology: chronic illness (CAD, CKD, DM)   Signs/Symptoms: severe fat depletion, severe muscle depletion   Interventions: Tube feeding    Consultants:   TR[facility]  Procedures:     Antimicrobials:  Anti-infectives (From admission, onward)   Start     Dose/Rate Route Frequency Ordered Stop   12/06/19 1800  piperacillin-tazobactam (ZOSYN) IVPB 2.25 g  Status:  Discontinued        2.25 g 100 mL/hr over 30 Minutes Intravenous Every 6 hours 12/06/19 1609 12/06/19 1629   12/06/19 1700  vancomycin (VANCOREADY) IVPB 750 mg/150 mL  Status:  Discontinued        750 mg 150 mL/hr over 60 Minutes Intravenous Every 24 hours 12/06/19 1609 12/06/19 1629   12/06/19 1700  metroNIDAZOLE (FLAGYL) IVPB 500 mg  Status:  Discontinued        500 mg 100 mL/hr over 60 Minutes Intravenous Every 8 hours 12/06/19 1609 12/06/19 1629   12/06/19 1700  Ampicillin-Sulbactam (UNASYN) 3 g in sodium chloride 0.9 % 100 mL IVPB        3 g 200 mL/hr over 30 Minutes Intravenous Every 12 hours 12/06/19 1630     12/06/19 1230  ceFAZolin (ANCEF) 2-4 GM/100ML-% IVPB       Note to Pharmacy: Arlean Hopping   : cabinet override      12/06/19 1230 12/07/19 0044   12/06/19 0700  ceFAZolin (ANCEF) IVPB 2g/100 mL premix        2 g 200 mL/hr over 30 Minutes Intravenous To Radiology 12/06/19 0612 12/06/19 1309         Subjective: Patient seen and examined.,  Her husband holding her hand, patient very lethargic.  Patient's daughter-in-law and son present the bedside as well.  Patient continues to have low blood pressure.  She is almost nonresponsive.  Objective: Vitals:   12/08/19 0400 12/08/19 0457 12/08/19 0547 12/08/19 0830  BP: (!) 48/36  (!) 80/48 107/64  Pulse: 80  88 96  Resp: 18   (!) 24  Temp: 99.9 F (37.7 C)    99.8 F (37.7 C)  TempSrc: Axillary   Axillary  SpO2: 98%   98%  Weight:  62 kg    Height:        Intake/Output Summary (Last 24 hours) at 12/08/2019 1100 Last data filed at 12/07/2019 1900 Gross per 24 hour  Intake 1049.82 ml  Output 700 ml  Net 349.82 ml   Filed Weights   12/06/19 0500 12/07/19 0423 12/08/19 0457  Weight: 63.9 kg 61.9 kg 62 kg    Examination:  General exam: Very lethargic and almost unresponsive. Respiratory system: Clear to auscultation. Respiratory effort normal. Cardiovascular system: S1 & S2 heard, RRR. No JVD, murmurs, rubs, gallops or clicks. No pedal edema. Gastrointestinal system: Abdomen is nondistended, soft and nontender. No organomegaly or masses felt. Normal bowel sounds heard.  Data Reviewed: I have personally reviewed following labs and imaging studies  CBC: Recent Labs  Lab 12/06/19  0604 12/07/19 1026 12/08/19 0418  WBC 19.6* 16.5* 13.2*  NEUTROABS  --  14.3*  --   HGB 8.3* 9.0* 7.5*  HCT 27.1* 28.2* 24.1*  MCV 88.3 88.7 90.9  PLT 452* 463* 423   Basic Metabolic Panel: Recent Labs  Lab 12/04/19 0606 12/05/19 0706 12/06/19 0604 12/06/19 1646 12/07/19 1026 12/08/19 0418  NA 141 140 139  --  140 140  K 4.9 5.6* 4.8  --  4.3 3.7  CL 107 105 103  --  106 109  CO2 25 22 25   --  21* 22  GLUCOSE 134* 207* 76  --  116* 212*  BUN 49* 59* 59*  --  54* 51*  CREATININE 1.07* 1.30* 1.10*  --  1.12* 0.99  CALCIUM 9.0 8.8* 8.9  --  8.2* 8.0*  MG  --   --   --  2.3  --   --   PHOS 5.2* 5.8* 5.6*  --  4.9* 4.0   GFR: Estimated Creatinine Clearance: 34.2 mL/min (by C-G formula based on SCr of 0.99 mg/dL). Liver Function Tests: Recent Labs  Lab 12/04/19 0606 12/05/19 0706 12/06/19 0604 12/07/19 1026 12/08/19 0418  ALBUMIN 2.0* 2.0* 1.9* 2.0* 1.9*   No results for input(s): LIPASE, AMYLASE in the last 168 hours. No results for input(s): AMMONIA in the last 168 hours. Coagulation Profile: Recent Labs  Lab 12/06/19 0604  INR  1.1   Cardiac Enzymes: No results for input(s): CKTOTAL, CKMB, CKMBINDEX, TROPONINI in the last 168 hours. BNP (last 3 results) Recent Labs    02/25/19 1339 03/08/19 1422 03/25/19 1554  PROBNP 5,548* 5,028* 3,715*   HbA1C: No results for input(s): HGBA1C in the last 72 hours. CBG: Recent Labs  Lab 12/07/19 1556 12/07/19 1959 12/07/19 2331 12/08/19 0418 12/08/19 0841  GLUCAP 155* 127* 188* 190* 199*   Lipid Profile: No results for input(s): CHOL, HDL, LDLCALC, TRIG, CHOLHDL, LDLDIRECT in the last 72 hours. Thyroid Function Tests: No results for input(s): TSH, T4TOTAL, FREET4, T3FREE, THYROIDAB in the last 72 hours. Anemia Panel: No results for input(s): VITAMINB12, FOLATE, FERRITIN, TIBC, IRON, RETICCTPCT in the last 72 hours. Sepsis Labs: Recent Labs  Lab 12/06/19 1646 12/06/19 1917  PROCALCITON 0.41  --   LATICACIDVEN 0.8 0.9    Recent Results (from the past 240 hour(s))  Culture, blood (routine x 2)     Status: None (Preliminary result)   Collection Time: 12/06/19  2:57 PM   Specimen: BLOOD LEFT HAND  Result Value Ref Range Status   Specimen Description BLOOD LEFT HAND  Final   Special Requests   Final    BOTTLES DRAWN AEROBIC AND ANAEROBIC Blood Culture adequate volume   Culture   Final    NO GROWTH 2 DAYS Performed at [facility], [place] [address], [place], [place] [address]    Report Status PENDING  Incomplete  Culture, blood (routine x 2)     Status: None (Preliminary result)   Collection Time: 12/06/19  3:01 PM   Specimen: BLOOD  Result Value Ref Range Status   Specimen Description BLOOD RIGHT ANTECUBITAL  Final   Special Requests   Final    BOTTLES DRAWN AEROBIC ONLY Blood Culture results may not be optimal due to an inadequate volume of blood received in culture bottles   Culture   Final    NO GROWTH 2 DAYS Performed at [facility], [place] [address], [place], [place] [address]    Report Status PENDING  Incomplete  SARS Coronavirus 2  by RT PCR (hospital order, performed in [facility] hospital lab) Nasopharyngeal Nasopharyngeal Swab     Status: None   Collection Time: 12/07/19  1:03 PM   Specimen: Nasopharyngeal Swab  Result Value Ref Range Status   SARS Coronavirus 2 NEGATIVE NEGATIVE Final    Comment: (NOTE) SARS-CoV-2 target nucleic acids are NOT DETECTED.  The SARS-CoV-2 RNA is generally detectable in upper and lower respiratory specimens during the acute phase of infection. The lowest concentration of SARS-CoV-2 viral copies this assay can detect is 250 copies / mL. A negative result does not preclude SARS-CoV-2 infection and should not be used as the sole basis for treatment or other patient management decisions.  A negative result may occur with improper specimen collection / handling, submission of specimen other than nasopharyngeal swab, presence of viral mutation(s) within the areas targeted by this assay, and inadequate number of viral copies (<250 copies / mL). A negative result must be combined with clinical observations, patient history, and epidemiological information.  Fact Sheet for Patients:   StrictlyIdeas.no  Fact Sheet for Healthcare Providers: BankingDealers.co.za  This test is not yet approved or  cleared by the [place] FDA and has been authorized for detection and/or diagnosis of SARS-CoV-2 by FDA under an Emergency Use Authorization (EUA).  This EUA will remain in effect (meaning this test can be used) for the duration of the COVID-19 declaration under Section 564(b)(1) of the Act, 21 U.S.C. section 360bbb-3(b)(1), unless the authorization is terminated or revoked sooner.  Performed at [facility], [place] [address] [place], [place] [address]       Radiology Studies: DG Chest 1 View  Result Date: 12/06/2019 CLINICAL DATA:  Leukocytosis EXAM: CHEST  1 VIEW COMPARISON:  11/28/2019 FINDINGS: Grossly unchanged cardiac  silhouette and mediastinal contours with atherosclerotic plaque within the thoracic aorta. Worsening left basilar heterogeneous/consolidative opacities. Trace bilateral effusions are not excluded. Pulmonary venous congestion without frank evidence of edema. No pneumothorax. No acute osseous abnormalities. Post lower cervical ACDF, incompletely evaluated. Post lumbar cement augmentation, incompletely evaluated. Vascular calcifications overlies expected location of the right carotid bulb. Interval removal of enteric tube. IMPRESSION: Worsening left basilar opacities, potentially atelectasis though conceivably infection and/or aspiration could have a similar appearance. Electronically Signed   By: Sandi Mariscal M.D.   On: 12/06/2019 13:59   IR GASTROSTOMY TUBE MOD SED  Result Date: 12/06/2019 INDICATION: Dysphagia. Please perform percutaneous gastrostomy tube placement for enteric nutrition supplementation purposes. EXAM: PULL TROUGH GASTROSTOMY TUBE PLACEMENT COMPARISON:  Abdominal CT-12/02/2019 MEDICATIONS: None, the patient is currently admitted to the hospital receiving intravenous antibiotics; Antibiotics were administered within 1 hour of the procedure. Glucagon 1 mg IV CONTRAST:  Fifteen mL of Omnipaque 300 administered into the gastric lumen. ANESTHESIA/SEDATION: None FLUOROSCOPY TIME:  2 minutes, 6 seconds (31 mGy) COMPLICATIONS: None immediate. PROCEDURE: Informed written consent was obtained from the patient's family following explanation of the procedure, risks, benefits and alternatives. A time out was performed prior to the initiation of the procedure. Ultrasound scanning was performed to demarcate the edge of the left lobe of the liver. Maximal barrier sterile technique utilized including caps, mask, sterile gowns, sterile gloves, large sterile drape, hand hygiene and Betadine prep. The left upper quadrant was sterilely prepped and draped. An oral gastric catheter was inserted into the stomach under  fluoroscopy. The existing nasogastric feeding tube was removed. The left costal margin and air opacified transverse colon were identified and avoided. Air was  injected into the stomach for insufflation and visualization under fluoroscopy. Under sterile conditions a 17 gauge trocar needle was utilized to access the stomach percutaneously beneath the left subcostal margin after the overlying soft tissues were anesthetized with 1% Lidocaine with epinephrine. Needle position was confirmed within the stomach with aspiration of air and injection of small amount of contrast. A single T tack was deployed for gastropexy. Over an Amplatz guide wire, a 9-French sheath was inserted into the stomach. A snare device was utilized to capture the oral gastric catheter. The snare device was pulled retrograde from the stomach up the esophagus and out the oropharynx. The 20-French pull-through gastrostomy was connected to the snare device and pulled antegrade through the oropharynx down the esophagus into the stomach and then through the percutaneous tract external to the patient. The gastrostomy was assembled externally. Contrast injection confirms position in the stomach. Several spot radiographic images were obtained in various obliquities for documentation. The patient tolerated procedure well without immediate post procedural complication. FINDINGS: After successful fluoroscopic guided placement, the gastrostomy tube is appropriately positioned with internal disc against the ventral aspect of the gastric lumen. IMPRESSION: Successful fluoroscopic insertion of a 20-French pull-through gastrostomy tube. The gastrostomy may be used immediately for medication administration and in 24 hrs for the initiation of feeds. Electronically Signed   By: Sandi Mariscal M.D.   On: 12/06/2019 13:58    Scheduled Meds: . azelastine  1 spray Each Nare BID  . chlorhexidine  15 mL Mouth Rinse BID  . Chlorhexidine Gluconate Cloth  6 each Topical  Q0600  . docusate  100 mg Per Tube BID  . furosemide  20 mg Per Tube Once per day on Mon Wed Fri  . levETIRAcetam  1,000 mg Per Tube BID  . valproic acid  250 mg Per Tube Q8H   Continuous Infusions: . sodium chloride 75 mL/hr at 12/08/19 0907  . ampicillin-sulbactam (UNASYN) IV 3 g (12/08/19 0434)     LOS: 16 days   Time spent: 25 minutes   Darliss Cheney, MD [facility]  12/08/2019, 11:00 AM   To contact the attending provider between 7A-7P or the covering provider during after hours 7P-7A, please log into the web site [email or web address].

## 2019-12-08 NOTE — NC FL2 (Addendum)
[place] LEVEL OF CARE SCREENING TOOL     IDENTIFICATION  Patient Name: [name] Birthdate: Nov 15, 1931 Sex: female Admission Date (Current Location): 11/22/2019  [facility] and [place] Number:  Herbalist and Address:  [facility]. [facility], [place] [address] [place],  [address]      Provider Number: [id number]  Attending Physician Name and Address:  Judith Part, MD  Relative Name and Phone Number:       Current Level of Care: Hospital Recommended Level of Care: [place] Prior Approval Number:    Date Approved/Denied:   PASRR Number: [id number]  Discharge Plan: SNF    Current Diagnoses: Patient Active Problem List   Diagnosis Date Noted  . Aspiration pneumonia ([place]) 12/06/2019  . Protein-calorie malnutrition, severe 11/25/2019  . Subdural hematoma ([place]) 11/22/2019  . Gait abnormality 09/06/2019  . Fall at home, initial encounter 02/15/2019  . Breast nodule 02/15/2019  . Memory problem 02/15/2019  . Pain 12/28/2017  . Primary osteoarthritis of left hand 12/28/2017  . TMJ pain dysfunction syndrome 08/26/2016  . Angina at rest [facility]) 06/15/2016  . Hypertensive urgency 06/15/2016  . Cerebrovascular disease 02/27/2016  . Anemia 02/01/2016  . Bradycardia 02/01/2016  . Left renal artery stenosis ([place]) 01/31/2016  . Presbycusis of both ears 01/29/2016  . BPPV (benign paroxysmal positional vertigo) 01/08/2016  . Impacted cerumen of right ear 01/08/2016  . Compression fracture of L1 lumbar vertebra (HCC) 04/23/2015  . T12 compression fracture ([place]) 03/20/2015  . Hyperthyroidism 03/11/2015  . Thyroid nodule 03/11/2015  . Syncope 03/08/2015  . Hypokalemia 03/08/2015  . CKD (chronic kidney disease), stage III 02/26/2015  . Multiple allergies 02/26/2015  . Hyponatremia 02/04/2015  . DM (diabetes mellitus) ([place]) 12/21/2011  . CAD (coronary artery disease) 12/21/2011  . HTN (hypertension) 12/21/2011   . Hyperlipidemia 12/21/2011  . GERD (gastroesophageal reflux disease) 12/21/2011    Orientation RESPIRATION BLADDER Height & Weight        O2 External catheter, Incontinent Weight: 136 lb 11 oz (62 kg) Height:  5' (152.4 cm)  BEHAVIORAL SYMPTOMS/MOOD NEUROLOGICAL BOWEL NUTRITION STATUS      Incontinent Feeding tube  AMBULATORY STATUS COMMUNICATION OF NEEDS Skin   Total Care Does not communicate                         Personal Care Assistance Level of Assistance  Total care Bathing Assistance: Maximum assistance Feeding assistance: Maximum assistance Dressing Assistance: Maximum assistance Total Care Assistance: Maximum assistance   Functional Limitations Info  Sight, Hearing, Speech Sight Info: Adequate Hearing Info: Adequate Speech Info: Adequate    SPECIAL CARE FACTORS FREQUENCY   (refered  to hospice)                    Contractures Contractures Info: Not present    Additional Factors Info  Code Status, Allergies Code Status Info: DNR Allergies Info: , Tekturna aliskiren, Tricor fenofibrate, Imdur isosorbide dinitrate, Amlodipine, Avapro ,irbesartan, Cardio complete nutritional supplements, Cardizem diltiazem hcl, Ciprofloxacin, Codeine, Cyclobenzaprine, Forteo , Inapsine , Ivp dye iodinated diagnostic agents, Shellfish allergy, and Tussionex pennkinetic er           Current Medications (12/08/2019):  This is the current hospital active medication list Current Facility-Administered Medications  Medication Dose Route Frequency Provider Last Rate Last Admin  . 0.45 % sodium chloride infusion   Intravenous Continuous Shawna Clamp, MD 75 mL/hr at 12/08/19 [phone]  New Bag at 12/08/19 0907  . acetaminophen (TYLENOL) tablet 650 mg  650 mg Per Tube Q4H PRN Meyran, Ocie Cornfield, NP   650 mg at 12/07/19 2302   Or  . acetaminophen (TYLENOL) suppository 650 mg  650 mg Rectal Q4H PRN Meyran, Ocie Cornfield, NP      . Ampicillin-Sulbactam (UNASYN) 3 g in sodium  chloride 0.9 % 100 mL IVPB  3 g Intravenous Q12H Donnamae Jude, RPH 200 mL/hr at 12/08/19 0434 3 g at 12/08/19 0434  . antiseptic oral rinse (BIOTENE) solution 15 mL  15 mL Topical PRN Pershing Proud, NP      . azelastine (ASTELIN) 0.1 % nasal spray 1 spray  1 spray Each Nare BID Shawna Clamp, MD   1 spray at 12/08/19 [phone]  . chlorhexidine (PERIDEX) 0.12 % solution 15 mL  15 mL Mouth Rinse BID Judith Part, MD   15 mL at 12/08/19 0919  . Chlorhexidine Gluconate Cloth 2 % PADS 6 each  6 each Topical Q0600 Judith Part, MD   6 each at 12/08/19 0900  . docusate (COLACE) 50 MG/5ML liquid 100 mg  100 mg Per Tube BID Judith Part, MD   100 mg at 12/08/19 3662  . furosemide (LASIX) tablet 20 mg  20 mg Per Tube Once per day on Mon Wed Fri Judith Part, MD   20 mg at 12/07/19 1037  . glycopyrrolate (ROBINUL) tablet 1 mg  1 mg Per Tube H4T PRN Pershing Proud, NP       Or  . glycopyrrolate (ROBINUL) injection 0.2 mg  0.2 mg Subcutaneous M5Y PRN Pershing Proud, NP      . levETIRAcetam (KEPPRA) 100 MG/ML solution 1,000 mg  1,000 mg Per Tube BID Judith Part, MD      . LORazepam (ATIVAN) 2 MG/ML concentrated solution 2 mg  2 mg Oral Y5K PRN Pershing Proud, NP      . nitroGLYCERIN (NITROSTAT) SL tablet 0.4 mg  0.4 mg Sublingual Q5 min PRN Judith Part, MD      . ondansetron (ZOFRAN) tablet 4 mg  4 mg Oral Q4H PRN Judith Part, MD       Or  . ondansetron (ZOFRAN) injection 4 mg  4 mg Intravenous Q4H PRN Judith Part, MD      . oxyCODONE (ROXICODONE) 5 MG/5ML solution 5 mg  5 mg Oral P5W PRN Pershing Proud, NP      . polyethylene glycol (MIRALAX / GLYCOLAX) packet 17 g  17 g Per Tube Daily PRN Shawna Clamp, MD   17 g at 11/27/19 1636  . polyvinyl alcohol (LIQUIFILM TEARS) 1.4 % ophthalmic solution 1 drop  1 drop Both Eyes QID PRN Pershing Proud, NP      . promethazine (PHENERGAN) tablet 12.5-25 mg  12.5-25 mg Per Tube S5K PRN Pershing Proud,  NP      . valproic acid (DEPAKENE) 250 MG/5ML solution 250 mg  250 mg Per Tube Q8H Judith Part, MD         Discharge Medications: Please see discharge summary for a list of discharge medications.  Relevant Imaging Results:  Relevant Lab Results:   Additional Information SSN [id number]  Vinie Sill, LCSWA

## 2019-12-08 NOTE — Progress Notes (Addendum)
Pt now transitioned to comfort care. Bp cuff, tube feeding, continuous pulse ox, and SCDs discontinued per orders. Family also agreeable to removing nasal cannula, as pt is sating 98% on 2L. Will check SP02 as needed.   Justice Rocher, RN

## 2019-12-08 NOTE — Consult Note (Signed)
Consultation Note Date: 12/08/2019   Patient Name: [name]  DOB: [birth year]  MRN: [id number]  Age / Sex: 86 y.o., female  PCP: [name], MD Referring Physician: Judith Part, MD  Reason for Consultation: Establishing goals of care  HPI/Patient Profile: 86 y.o. female  with past medical history of hypertension, CAD, diabetes, GERD, left carotid artery stenosis, chronic dizziness, frequent falls, HFpEF, dementia admitted on 11/22/2019 with subdural hematoma s/p fall. Prolonged hospitalization with poor progression and ongoing decline towards end of life.   Clinical Assessment and Goals of Care: I have spoken with family at bedside (husband, son, daughter-in-law). They were called in last night when [name]' had acute decline. She appears more hemodynamically stable right now but high risk for acute decompensation/decline. They are anxious for her to return to [facility] with hospice and comfort care. I have explained that I do not recommend to continue tube feeding as this will provide her with more discomfort than benefit and they agree with our recommendations to help her to be comfortable. They want her to spend her last hours to days at [facility] surrounded by family. I recommend IV antiepileptics be transitioned to per tube. I will adjust other medications to reflect comfort. Will maintain IVF and antibiotics until discharged to help her to stabilize as much as possible to be able to discharge per family wishes. They understand that these interventions will not be continued at SNF. Will discuss with TOC team, nursing, and attending.   Update: Unable to return to [facility] and family interested in [facility]. Confirmed with [facility] that they can admit her today.   All questions/concerns addressed. Emotional support provided. Discussed with  attending and TOC team.    Primary Decision Maker NEXT OF KIN husband    SUMMARY OF RECOMMENDATIONS   Full comfort care and transition to hospice facility  Code Status/Advance Care Planning:  DNR   Symptom Management:   Orders changed to reflect full comfort care.   Comfort medications prn and may titrate dosage/frequency as needed to achieve comfort.   Prognosis:   Hours - Days  Discharge Planning: Hospice facility      Primary Diagnoses: Present on Admission: . Subdural hematoma ([place]) . Protein-calorie malnutrition, severe . Bradycardia . CAD (coronary artery disease) . HTN (hypertension) . Hyperlipidemia . GERD (gastroesophageal reflux disease) . CKD (chronic kidney disease), stage III . Aspiration pneumonia ([place])   I have reviewed the medical record, interviewed the patient and family, and examined the patient. The following aspects are pertinent.  Past Medical History:  Diagnosis Date  . BPPV (benign paroxysmal positional vertigo) 01/08/2016  . Bradycardia    a. Coreg decreased due to HR low 40s on event monitor 2017 -> further decreased due to HR upper 30s in 01/2016.  . Breast nodule 02/15/2019  . CAD (coronary artery disease)    a. RV infarct 2007 c/b high grade AV block with total occlusion of RCA, unable to treat with  PCI. b. Nuc 10/2015: scar but no ischemia.  . Carotid artery disease ([place])   . Chronic anemia   . Chronic lower back pain   . CKD (chronic kidney disease), stage III   . Complication of anesthesia    "almost died when I had my breast reduction"  . Diabetes ([place]) 2000-07-24  . Dyslipidemia   . Gait abnormality 09/06/2019  . GERD (gastroesophageal reflux disease)   . Heart murmur   . High cholesterol   . History of hiatal hernia   . HTN (hypertension)   . Hyperthyroidism   . Memory problem 02/15/2019  . Meniere disease    "for years" takes Antivert 3 times per day (01/31/2016)  . Myocardial infarction [facility]) Jul 24, 2005   "almost died"  .  Near syncope   . Osteoporosis   . Presbycusis of both ears 01/29/2016  . Renal artery stenosis ([facility])    a. left renal artery stent 07-25-10. b. s/p repeat left renal stenting 01/2016.  . Stroke [facility])    a. Prior infarcts seen on head CT.  . TMJ pain dysfunction syndrome 08/26/2016  . Type II diabetes mellitus ([place])    "controlled w/diet and exercise only" (01/31/2016)  . Weakness 02/26/2015   Social History   Socioeconomic History  . Marital status: Married    Spouse name: Not on file  . Number of children: 3  . Years of education: college  . Highest education level: Not on file  Occupational History  . Occupation: Retired Therapist, sports  Tobacco Use  . Smoking status: Former Smoker    Packs/day: 1.00    Years: 30.00    Pack years: 30.00    Types: Cigarettes  . Smokeless tobacco: Never Used  . Tobacco comment: "quit smoking in 07-25-87"  Vaping Use  . Vaping Use: Never used  Substance and Sexual Activity  . Alcohol use: No    Alcohol/week: 0.0 standard drinks  . Drug use: No  . Sexual activity: Not on file  Other Topics Concern  . Not on file  Social History Narrative   She and her husband live at [facility].   Right-handed.   No daily caffeine use.   Social Determinants of Health   Financial Resource Strain:   . Difficulty of Paying Living Expenses: Not on file  Food Insecurity:   . Worried About Charity fundraiser in the Last Year: Not on file  . Ran Out of Food in the Last Year: Not on file  Transportation Needs:   . Lack of Transportation (Medical): Not on file  . Lack of Transportation (Non-Medical): Not on file  Physical Activity:   . Days of Exercise per Week: Not on file  . Minutes of Exercise per Session: Not on file  Stress:   . Feeling of Stress : Not on file  Social Connections:   . Frequency of Communication with Friends and Family: Not on file  . Frequency of Social Gatherings with Friends and Family: Not on file  . Attends Religious Services: Not on  file  . Active Member of Clubs or Organizations: Not on file  . Attends Archivist Meetings: Not on file  . Marital Status: Not on file   Family History  Problem Relation Age of Onset  . Diabetes type II Mother   . Heart attack Mother   . Heart disease Mother   . Stroke Mother   . Congestive Heart Failure Father   . Pulmonary embolism Father   .  Alzheimer's disease Sister   . Heart disease Sister   . Heart disease Brother   . Healthy Sister   . Healthy Sister   . Stroke Brother   . Dementia Brother        VASCULAR  . Stroke Brother   . Diabetes type II Brother   . Other Brother        KILLED IN PLANE CRASH  . Pulmonary disease Sister   . Heart attack Sister   . Kidney failure Sister   . Diabetes type II Sister   . Diabetes type II Sister   . Kidney failure Sister    Scheduled Meds: . azelastine  1 spray Each Nare BID  . chlorhexidine  15 mL Mouth Rinse BID  . Chlorhexidine Gluconate Cloth  6 each Topical Q0600  . docusate  100 mg Per Tube BID  . furosemide  20 mg Per Tube Once per day on Mon Wed Fri   Continuous Infusions: . sodium chloride 75 mL/hr at 12/08/19 0907  . ampicillin-sulbactam (UNASYN) IV 3 g (12/08/19 0434)  . levETIRAcetam 1,000 mg (12/08/19 0910)  . valproate sodium 250 mg (12/08/19 0543)   PRN Meds:.acetaminophen **OR** acetaminophen, antiseptic oral rinse, glycopyrrolate **OR** glycopyrrolate, LORazepam, nitroGLYCERIN, ondansetron **OR** ondansetron (ZOFRAN) IV, oxyCODONE, polyethylene glycol, polyvinyl alcohol, promethazine Allergies  Allergen Reactions  . Fentanyl Anaphylaxis    Cardiac arrest and coded  . Fish Allergy Anaphylaxis and Rash  . Morphine And Related Anaphylaxis    Cardiac arrest and coded Per Dr. Conley Canal patient has taken percocet without issue  . Singulair [Montelukast] Palpitations and Other (See Comments)    Increased BP and HR  . Actonel [Risedronate Sodium] Rash and Other (See Comments)    weakness  . Dilaudid  [Hydromorphone Hcl] Rash and Other (See Comments)    Crying and screaming  . Fosamax [Alendronate Sodium] Rash and Other (See Comments)    Weakness and myalgias  . Hydralazine Diarrhea and Other (See Comments)    Swelling in stomach  . Lipitor [Atorvastatin] Swelling and Rash    Tongue swelling   . Lisinopril Diarrhea, Rash and Cough  . Septra [Sulfamethoxazole-Trimethoprim] Nausea And Vomiting and Rash  . Tekturna [Aliskiren] Swelling and Rash    Swelling of tongue  . Tricor [Fenofibrate] Rash and Other (See Comments)    Flu symptoms  . Imdur [Isosorbide Dinitrate] Other (See Comments)    Headaches and blindness  . Amlodipine Diarrhea  . Avapro [Irbesartan] Rash  . Cardio Complete [Nutritional Supplements] Rash  . Cardizem [Diltiazem Hcl] Rash  . Ciprofloxacin Rash and Other (See Comments)    Black spot on body  . Codeine Rash  . Cyclobenzaprine Nausea Only  . Forteo [Parathyroid Hormone (Recomb)] Rash  . Inapsine [Droperidol] Rash  . Ivp Dye [Iodinated Diagnostic Agents] Rash  . Shellfish Allergy Rash  . Tussionex Pennkinetic Er [Hydrocod Polst-Cpm Polst Er] Itching and Rash   Review of Systems  Unable to perform ROS: Acuity of condition    Physical Exam Vitals and nursing note reviewed.  Constitutional:      General: She is not in acute distress.    Appearance: She is ill-appearing.     Comments: Frail, elderly  Cardiovascular:     Rate and Rhythm: Normal rate.  Pulmonary:     Effort: No tachypnea, accessory muscle usage or respiratory distress.  Abdominal:     General: Abdomen is flat.     Palpations: Abdomen is soft.  Neurological:     Comments:  Mostly unresponsive     Vital Signs: BP 107/64 (BP Location: Left Arm)   Pulse 96   Temp 99.8 F (37.7 C) (Axillary)   Resp (!) 24   Ht 5' (1.524 m)   Wt 62 kg   SpO2 98%   BMI 26.69 kg/m  Pain Scale: PAINAD   Pain Score: 0-No pain   SpO2: SpO2: 98 % O2 Device:SpO2: 98 % O2 Flow Rate: .O2 Flow Rate  (L/min): 2 L/min  IO: Intake/output summary:   Intake/Output Summary (Last 24 hours) at 12/08/2019 1018 Last data filed at 12/07/2019 1900 Gross per 24 hour  Intake 1349.82 ml  Output 700 ml  Net 649.82 ml    LBM: Last BM Date: 12/06/19 Baseline Weight: Weight: 47 kg Most recent weight: Weight: 62 kg     Palliative Assessment/Data:     Time In: 0920 Time Out: 1030 Time Total: 70 min Greater than 50%  of this time was spent counseling and coordinating care related to the above assessment and plan.  Signed by: Vinie Sill, NP Palliative Medicine Team Pager # [phone] (M-F 8a-5p) Team Phone # [phone] (Nights/Weekends)

## 2019-12-08 NOTE — Progress Notes (Signed)
Manufacturing engineer [facility]) Hospital Liaison note.     Received request from [place]  for family interest in [facility]. Chart reviewed and eligibility confirmed. Spoke with family to confirm interest and explain services. Family agreeable to transfer today. TOC aware.     [facility] will notify TOC when registration paperwork has been completed to arrange transport.    RN please call report to [phone].   Thank you,      Farrel Gordon, RN, CCM       [facility] (listed on [place] under Hospice/[facility])     [phone]

## 2019-12-08 NOTE — Progress Notes (Signed)
Palliative:  Full consult note to follow. I have spoken with family at bedside (husband, son, daughter-in-law). They were called in last night when Ms. Gest' had acute decline. She appears more hemodynamically stable right now but high risk for acute decompensation/decline. They are anxious for her to return to [facility] with hospice and comfort care. I have explained that I do not recommend to continue tube feeding as this will provide her with more discomfort than benefit and they agree with our recommendations to help her to be comfortable. They want her to spend her last hours to days at [facility] surrounded by family. I recommend IV antiepileptics be transitioned to per tube. I will adjust other medications to reflect comfort. Will maintain IVF and antibiotics until discharged to help her to stabilize as much as possible to be able to discharge per family wishes. They understand that these interventions will not be continued at SNF. Will discuss with TOC team, nursing, and attending.   Vinie Sill, NP Palliative Medicine Team Pager [phone] (Please see [email or web address] for schedule) Team Phone [phone]

## 2019-12-08 NOTE — Progress Notes (Signed)
SLP Cancellation Note  Patient Details Name: [name] MRN: [id number] DOB: [birth year]   Cancelled treatment:       Reason Eval/Treat Not Completed: Medical issues which prohibited therapy. Given overnight events, will d/c orders, sign off.    Maiko Salais, Katherene Ponto 12/08/2019, 7:50 AM

## 2019-12-08 NOTE — Progress Notes (Signed)
OT Cancellation Note  Patient Details Name: [name] MRN: [id number] DOB: September 18, 1931   Cancelled Treatment:    Reason Eval/Treat Not Completed: Pt unable to participate in meaningful therapy at this time. Agree with PT note that recommends nursing for ROM and positioning. Spoke with family at bedside who agree to discontinue OT at this time, should a change arise, please feel free to re-order.   [place] 12/08/2019, 8:53 AM   Jesse Sans OTR/L Acute Rehabilitation Services Pager: [phone] Office: [phone]

## 2019-12-08 NOTE — Progress Notes (Signed)
The chaplain responded to a page from the nurse. The family asked for spiritual support as the patient is actively dying. The chaplain provided supportive conversation and prayer. The chaplain is available if further support is needed.  Brion Aliment Chaplain Resident For questions concerning this note please contact me by pager [phone]

## 2019-12-11 LAB — CULTURE, BLOOD (ROUTINE X 2)
Culture: NO GROWTH
Culture: NO GROWTH
Special Requests: ADEQUATE

## 2019-12-15 ENCOUNTER — Ambulatory Visit: Payer: PPO

## 2019-12-16 DIAGNOSIS — Z515 Encounter for palliative care: Secondary | ICD-10-CM

## 2019-12-16 DIAGNOSIS — Z7189 Other specified counseling: Secondary | ICD-10-CM

## 2020-01-13 DEATH — deceased

## 2020-03-12 ENCOUNTER — Ambulatory Visit: Payer: PPO | Admitting: Neurology

## 2021-01-29 IMAGING — CT CT L SPINE W/O CM
3 of 5 series · 12 of 33 positions shown, 14 images · non-contrast
Comparison: Lumbar spine radiograph 04/23/2015, chest radiograph
06/15/2016, thoracolumbar radiograph 03/20/2015, CT abdomen pelvis
01/30/2015, lumbar MR 04/13/2015

CLINICAL DATA: Mechanical fall, lower back pain

EXAM:
CT THORACIC AND LUMBAR SPINE WITHOUT CONTRAST
TECHNIQUE: Multiplanar CT reconstructions of the thoracic and lumbar spine were
generated from the same day CT of the chest, abdomen and pelvis. No
contrast was administered for this exam.

[Series 1: t/l st spine wo, · axial · 0.39mm/px · z∈[-592,-212]mm · 6 of 247 slices shown, 8 images]
[im 38/247  soft-tissue]
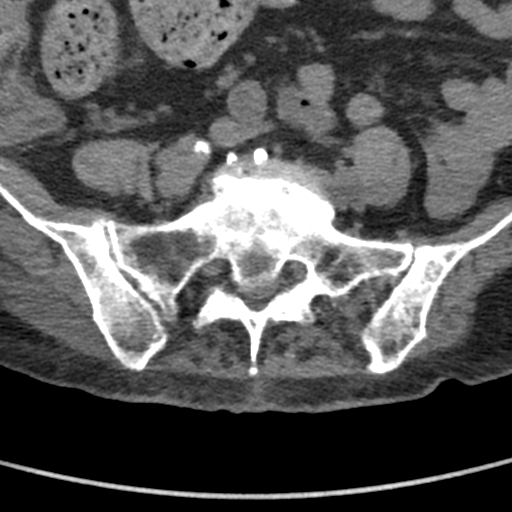
[im 38/247  bone]
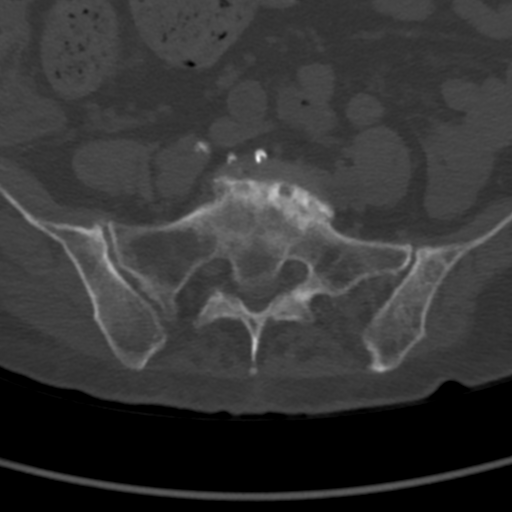
[im 76/247  bone]
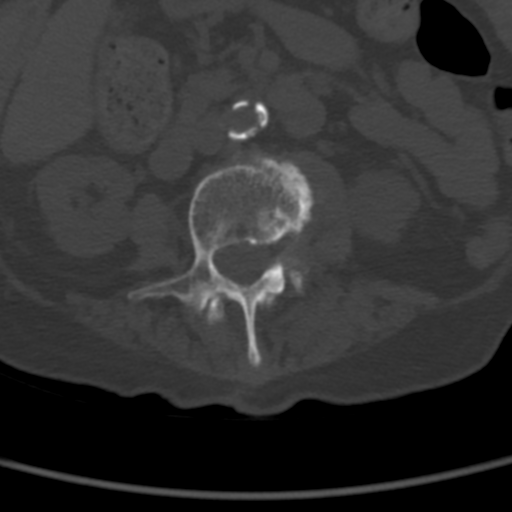
[im 114/247  bone]
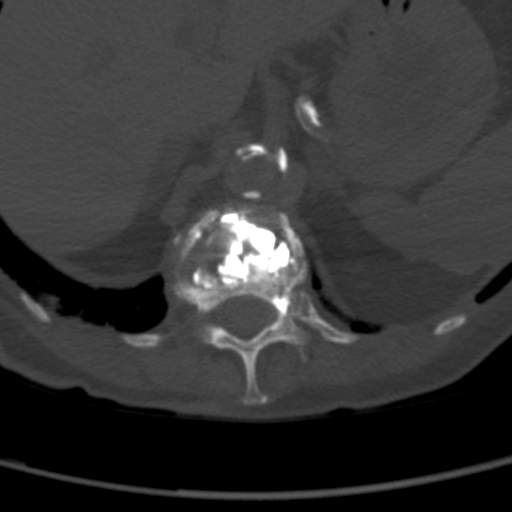
[im 152/247  bone]
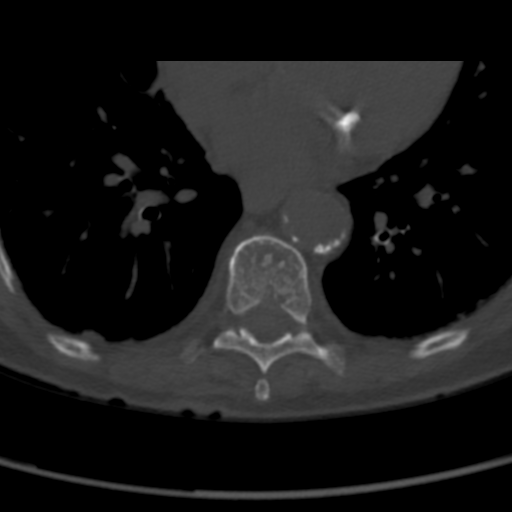
[im 190/247  soft-tissue]
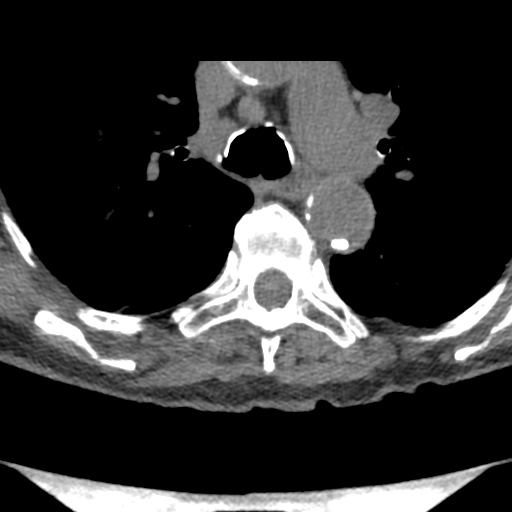
[im 190/247  bone]
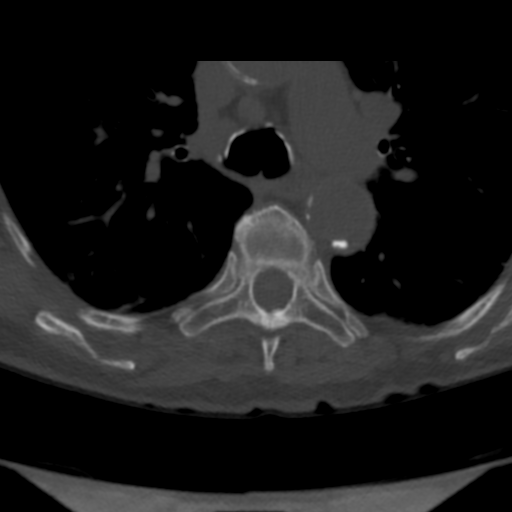
[im 228/247  bone]
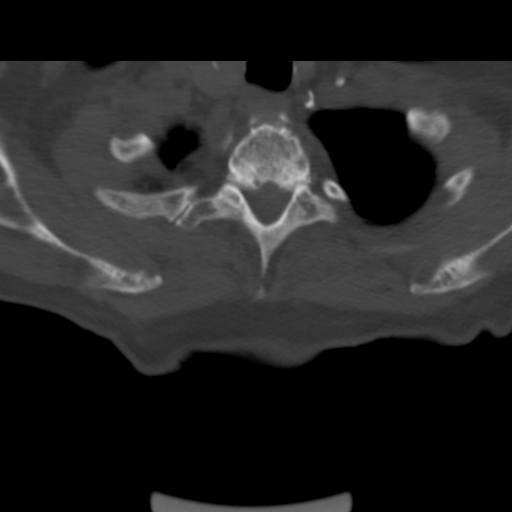

[Series 5: t bone cor spine wo, · coronal · 0.39mm/px · 1 of 101 slices shown]
[im 51/101  bone]
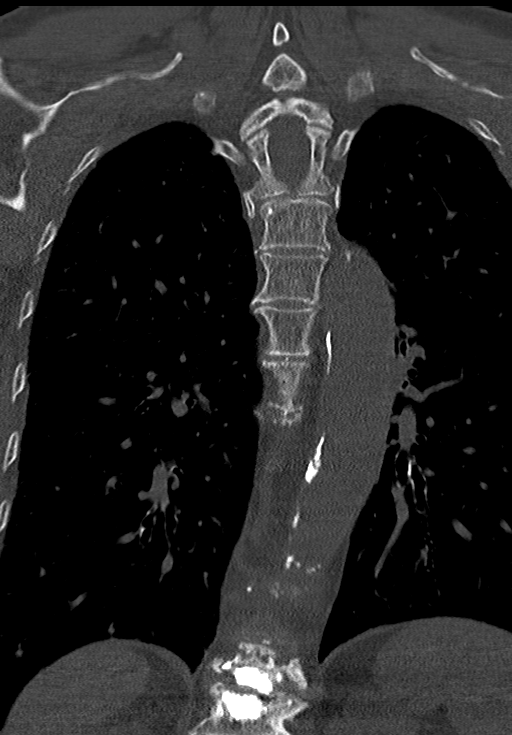

[Series 8: l bone sag spine wo, · sagittal · 0.39mm/px · 5 of 101 slices shown]
[im 17/101  bone]
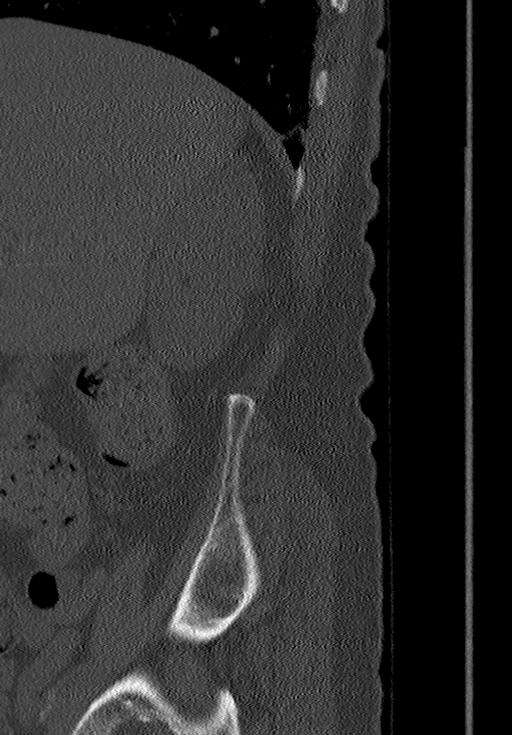
[im 34/101  bone]
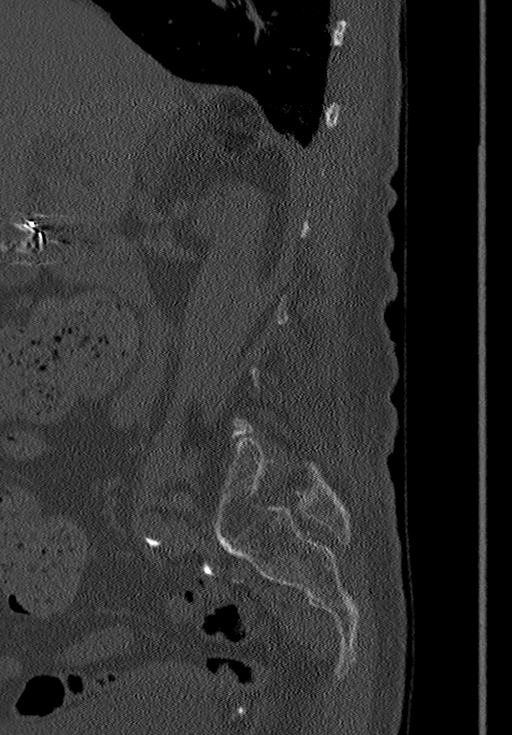
[im 51/101  bone]
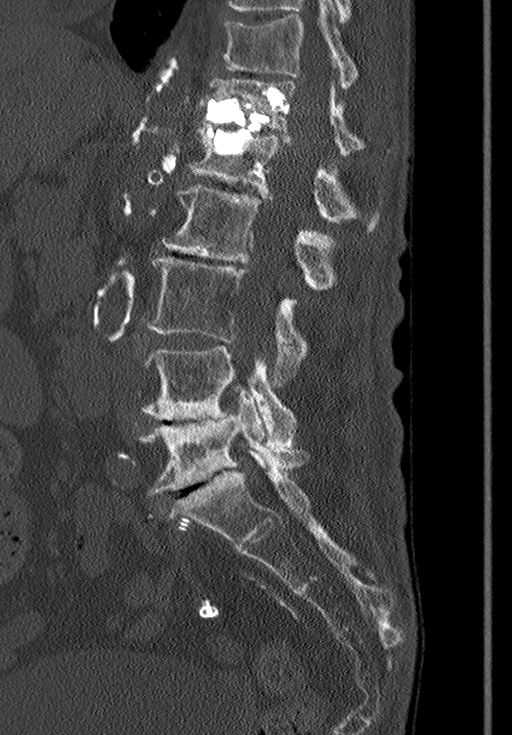
[im 67/101  bone]
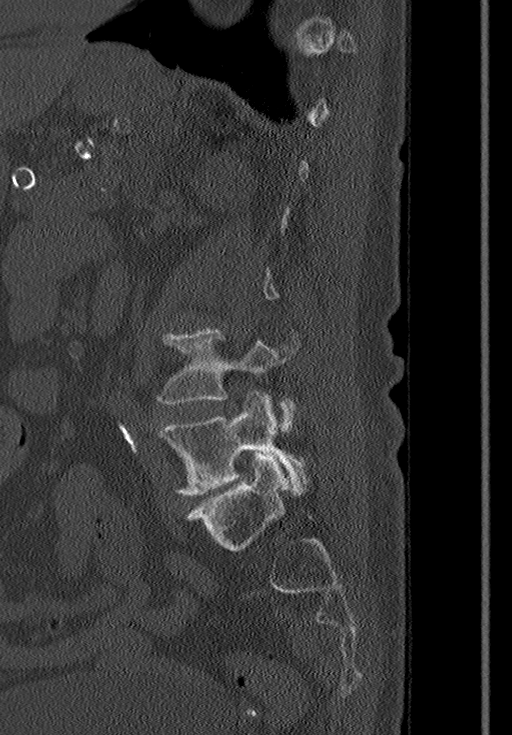
[im 84/101  bone]
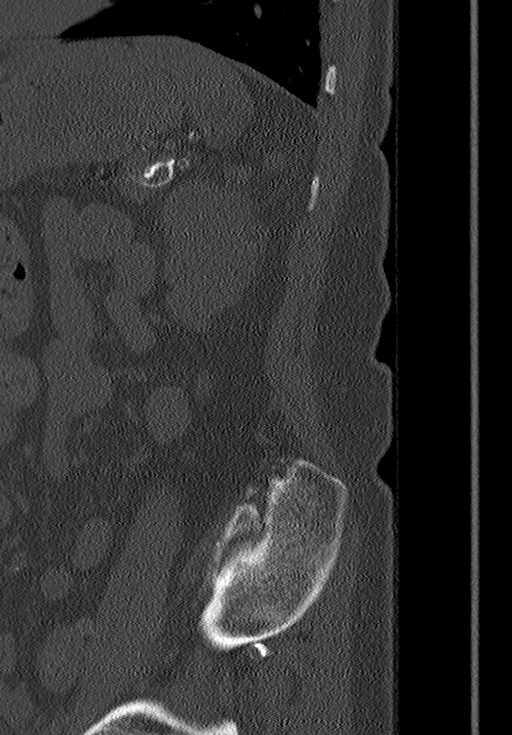

[12 of 33 positions shown; findings below may reference images not displayed]

FINDINGS: CT THORACIC SPINE FINDINGS

Alignment: Preservation of normal thoracic kyphosis. No traumatic
listhesis. Posterior elements remain normally aligned.

Vertebrae: Methylmethacrylate cement noted in the T12 vertebral body
with a grossly stable compression deformity of the vertebrae when
compared to prior thoracolumbar spinal imaging from 6312. Prior
cervical fixation hardware is partially imaged on this examination.

Paraspinal and other soft tissues: No paraspinal fluid or swelling.
No visible canal hematoma.

Disc levels: Multilevel discogenic and facet degenerative changes
are present throughout the thoracic spine. Features are most
pronounced at the T1-2 level with sclerotic endplate changes.
Posterior disc osteophyte complexes are present at the T1-2 level as
well as T7-T8 which efface the ventral thecal sac and results in at
most mild canal stenosis. Posterior spurring and facet hypertrophy
at the T1-2 level also result in mild-to-moderate bilateral
foraminal narrowing at this level. No other significant neural
foraminal narrowing in the thoracic spine.

CT LUMBAR SPINE FINDINGS

Segmentation: 5 lumbar type vertebral bodies are identified.

Alignment: Dextrocurvature of the upper lumbar spine apex at L1-2
with levocurvature of the lower lumbar spine apex at L4-5. No acute
traumatic listhesis. Mild degenerative right lateral listhesis of L3
on L4. Curvature is stable from prior studies.

Vertebrae: The osseous structures appear diffusely demineralized
which may limit detection of small or nondisplaced fractures. Remote
compression deformity of the L1 vertebral body post vertebral
augmentation with methylmethacrylate cement in the L1 level.
Appearance is grossly similar to comparison radiography from 6312.
Most recent cross-sectional comparison (Sunday March, 2015) was before
placement of this augmentation. Posterior elements and transverse
processes are intact. No suspicious osseous lesions are seen.

Paraspinal and other soft tissues: No acute paravertebral swelling
or fluid. No visible canal hematoma

Disc levels:

Level by level evaluation of the lumbar spine below:

T12-L1: Compression deformities at T11 and T12 with 5 mm of
retropulsion of the inferior T12 endplate resulting in mild canal
stenosis at this level. Severe left and moderate right foraminal
narrowing

L1-L2: Near complete disc height loss with vacuum phenomenon and
posterior spurring with mild canal stenosis and moderate bilateral
foraminal narrowing

L2-L3: Near complete disc height loss with vacuum phenomenon and
posterior spurring with facet hypertrophic changes. Global disc
bulge with a right central protrusion. Mild canal stenosis. Mild
bilateral foraminal narrowing.

L3-L4: Severe disc height loss with global disc bulge and moderate
posterior facet hypertrophy with mild canal stenosis and moderate
bilateral foraminal narrowing.

L4-L5: Near complete disc height loss with global disc bulge,
superimposed right central and subarticular disc protrusion.
Posterior ligamentum flavum infolding. Moderate facet hypertrophic
changes. Moderate canal stenosis. Moderate left and severe right
neural foraminal narrowing.

L5-S1: Complete disc height loss with vacuum phenomenon. Global disc
bulge. Severe facet hypertrophy. Foci of gas or lata about the left
articular facet. Changes result in moderate canal stenosis and
severe bilateral foraminal narrowing with additional narrowing of
the left subarticular recess.
IMPRESSION: 1. No definite acute traumatic injury of the thoracic or lumbar
spine.
2. Remote compression deformity of the T12 vertebral body post
augmentation with 40% residual anterior height loss. Appearance is
similar to comparison radiography and MR imaging.
3. Remote compression deformity of the L1 vertebral body post
augmentation with approximately 20% residual height loss anteriorly.
Appearance is similar to comparison radiographs.
4. If there is concern for acute injury upon the more remote
deformity at the augmented levels, MRI could be obtained to assess
for edematous changes.
5. Multilevel degenerative changes throughout the thoracic and
lumbar spine as described above. Features are most pronounced in the
lower lumbar levels with multilevel moderate canal stenoses and
moderate to severe foraminal narrowing.
6. For findings in the included portions of the chest, abdomen and
pelvis. Please see dedicated report for the studies from which this
exam is reconstructed.
